# Patient Record
Sex: Female | Born: 1955 | Hispanic: Yes | Smoking: Never smoker
Health system: Southern US, Community
[De-identification: ages and names within clinical notes are randomized; demographics above are authoritative.]

## PROBLEM LIST (undated history)

## (undated) DIAGNOSIS — E113299 Type 2 diabetes mellitus with mild nonproliferative diabetic retinopathy without macular edema, unspecified eye: Secondary | ICD-10-CM

## (undated) DIAGNOSIS — N189 Chronic kidney disease, unspecified: Secondary | ICD-10-CM

## (undated) DIAGNOSIS — T82898A Other specified complication of vascular prosthetic devices, implants and grafts, initial encounter: Secondary | ICD-10-CM

## (undated) DIAGNOSIS — E559 Vitamin D deficiency, unspecified: Secondary | ICD-10-CM

## (undated) DIAGNOSIS — R42 Dizziness and giddiness: Secondary | ICD-10-CM

## (undated) DIAGNOSIS — N3001 Acute cystitis with hematuria: Secondary | ICD-10-CM

## (undated) DIAGNOSIS — E1139 Type 2 diabetes mellitus with other diabetic ophthalmic complication: Secondary | ICD-10-CM

## (undated) DIAGNOSIS — D649 Anemia, unspecified: Secondary | ICD-10-CM

## (undated) DIAGNOSIS — Z9119 Patient's noncompliance with other medical treatment and regimen: Secondary | ICD-10-CM

## (undated) DIAGNOSIS — G629 Polyneuropathy, unspecified: Secondary | ICD-10-CM

## (undated) DIAGNOSIS — Z91199 Patient's noncompliance with other medical treatment and regimen due to unspecified reason: Secondary | ICD-10-CM

## (undated) DIAGNOSIS — G43809 Other migraine, not intractable, without status migrainosus: Secondary | ICD-10-CM

## (undated) DIAGNOSIS — IMO0002 Reserved for concepts with insufficient information to code with codable children: Secondary | ICD-10-CM

## (undated) DIAGNOSIS — E785 Hyperlipidemia, unspecified: Secondary | ICD-10-CM

## (undated) DIAGNOSIS — Z992 Dependence on renal dialysis: Secondary | ICD-10-CM

## (undated) DIAGNOSIS — I639 Cerebral infarction, unspecified: Secondary | ICD-10-CM

## (undated) DIAGNOSIS — M810 Age-related osteoporosis without current pathological fracture: Secondary | ICD-10-CM

## (undated) DIAGNOSIS — I10 Essential (primary) hypertension: Secondary | ICD-10-CM

## (undated) DIAGNOSIS — E1165 Type 2 diabetes mellitus with hyperglycemia: Secondary | ICD-10-CM

## (undated) DIAGNOSIS — H269 Unspecified cataract: Secondary | ICD-10-CM

## (undated) HISTORY — DX: Patient's noncompliance with other medical treatment and regimen: Z91.19

## (undated) HISTORY — DX: Other migraine, not intractable, without status migrainosus: G43.809

## (undated) HISTORY — DX: Age-related osteoporosis without current pathological fracture: M81.0

## (undated) HISTORY — DX: Polyneuropathy, unspecified: G62.9

## (undated) HISTORY — DX: Type 2 diabetes mellitus with mild nonproliferative diabetic retinopathy without macular edema, unspecified eye: E11.3299

## (undated) HISTORY — DX: Acute cystitis with hematuria: N30.01

## (undated) HISTORY — DX: Type 2 diabetes mellitus with other diabetic ophthalmic complication: E11.39

## (undated) HISTORY — DX: Patient's noncompliance with other medical treatment and regimen due to unspecified reason: Z91.199

## (undated) HISTORY — DX: Reserved for concepts with insufficient information to code with codable children: IMO0002

## (undated) HISTORY — DX: Vitamin D deficiency, unspecified: E55.9

## (undated) HISTORY — DX: Unspecified cataract: H26.9

## (undated) HISTORY — DX: Hyperlipidemia, unspecified: E78.5

## (undated) HISTORY — DX: Type 2 diabetes mellitus with hyperglycemia: E11.65

## (undated) HISTORY — DX: Dizziness and giddiness: R42

## (undated) HISTORY — PX: DILATION AND CURETTAGE OF UTERUS: SHX78

## (undated) HISTORY — DX: Essential (primary) hypertension: I10

---

## 2015-05-26 ENCOUNTER — Ambulatory Visit: Payer: Self-pay

## 2015-05-26 DIAGNOSIS — N3001 Acute cystitis with hematuria: Secondary | ICD-10-CM

## 2015-05-26 DIAGNOSIS — G629 Polyneuropathy, unspecified: Secondary | ICD-10-CM | POA: Insufficient documentation

## 2015-05-26 DIAGNOSIS — N3000 Acute cystitis without hematuria: Secondary | ICD-10-CM | POA: Insufficient documentation

## 2015-09-20 ENCOUNTER — Emergency Department
Admission: EM | Admit: 2015-09-20 | Discharge: 2015-09-20 | Disposition: A | Discharge status: Home / Self Care | Payer: Self-pay | Attending: Emergency Medicine | Admitting: Emergency Medicine

## 2015-09-20 ENCOUNTER — Ambulatory Visit: Payer: Self-pay | Attending: Oncology

## 2015-09-20 ENCOUNTER — Ambulatory Visit
Admission: RE | Admit: 2015-09-20 | Discharge: 2015-09-20 | Disposition: A | Discharge status: Home / Self Care | Payer: Self-pay | Source: Ambulatory Visit | Attending: Oncology | Admitting: Oncology

## 2015-09-20 ENCOUNTER — Other Ambulatory Visit: Payer: Self-pay

## 2015-09-20 VITALS — BP 210/91 | HR 90 | Temp 96.1°F | Ht <= 58 in | Wt 107.6 lb

## 2015-09-20 DIAGNOSIS — M542 Cervicalgia: Secondary | ICD-10-CM | POA: Insufficient documentation

## 2015-09-20 DIAGNOSIS — E113299 Type 2 diabetes mellitus with mild nonproliferative diabetic retinopathy without macular edema, unspecified eye: Secondary | ICD-10-CM | POA: Insufficient documentation

## 2015-09-20 DIAGNOSIS — M25512 Pain in left shoulder: Secondary | ICD-10-CM | POA: Insufficient documentation

## 2015-09-20 DIAGNOSIS — R2232 Localized swelling, mass and lump, left upper limb: Secondary | ICD-10-CM | POA: Insufficient documentation

## 2015-09-20 DIAGNOSIS — N289 Disorder of kidney and ureter, unspecified: Secondary | ICD-10-CM | POA: Insufficient documentation

## 2015-09-20 DIAGNOSIS — I10 Essential (primary) hypertension: Secondary | ICD-10-CM | POA: Insufficient documentation

## 2015-09-20 DIAGNOSIS — Z79899 Other long term (current) drug therapy: Secondary | ICD-10-CM | POA: Insufficient documentation

## 2015-09-20 DIAGNOSIS — Z794 Long term (current) use of insulin: Secondary | ICD-10-CM | POA: Insufficient documentation

## 2015-09-20 DIAGNOSIS — Z0000 Encounter for general adult medical examination without abnormal findings: Secondary | ICD-10-CM

## 2015-09-20 DIAGNOSIS — E1165 Type 2 diabetes mellitus with hyperglycemia: Secondary | ICD-10-CM | POA: Insufficient documentation

## 2015-09-20 DIAGNOSIS — E1139 Type 2 diabetes mellitus with other diabetic ophthalmic complication: Secondary | ICD-10-CM | POA: Insufficient documentation

## 2015-09-20 DIAGNOSIS — R221 Localized swelling, mass and lump, neck: Secondary | ICD-10-CM | POA: Insufficient documentation

## 2015-09-20 LAB — BASIC METABOLIC PANEL
Anion gap: 6 (ref 5–15)
BUN: 32 mg/dL — AB (ref 6–20)
CHLORIDE: 102 mmol/L (ref 101–111)
CO2: 29 mmol/L (ref 22–32)
CREATININE: 1.79 mg/dL — AB (ref 0.44–1.00)
Calcium: 8.8 mg/dL — ABNORMAL LOW (ref 8.9–10.3)
GFR calc Af Amer: 35 mL/min — ABNORMAL LOW (ref >60)
GFR calc non Af Amer: 30 mL/min — ABNORMAL LOW (ref >60)
Glucose, Bld: 244 mg/dL — ABNORMAL HIGH (ref 65–99)
Potassium: 4.9 mmol/L (ref 3.5–5.1)
SODIUM: 137 mmol/L (ref 135–145)

## 2015-09-20 LAB — CBC
HCT: 35 % (ref 35.0–47.0)
HEMOGLOBIN: 11.8 g/dL — AB (ref 12.0–16.0)
MCH: 28.5 pg (ref 26.0–34.0)
MCHC: 33.8 g/dL (ref 32.0–36.0)
MCV: 84.1 fL (ref 80.0–100.0)
Platelets: 236 10*3/uL (ref 150–440)
RBC: 4.16 MIL/uL (ref 3.80–5.20)
RDW: 13.6 % (ref 11.5–14.5)
WBC: 9.6 10*3/uL (ref 3.6–11.0)

## 2015-09-20 LAB — TROPONIN I

## 2015-09-20 MED ORDER — CLONIDINE HCL 0.1 MG PO TABS
0.1000 mg | ORAL_TABLET | Freq: Once | ORAL | Status: AC
  Administered 2015-09-20: 0.1 mg via ORAL
  Filled 2015-09-20: qty 1

## 2015-09-20 NOTE — ED Notes (Signed)
Pt went for mammogram this morning but did not complete due to her blood pressure being high. Staff recommended she be evaluated at ED. Pt reports she does take medication for high blood pressure and insulin for diabetes. Family and interpreter at bedside.

## 2015-09-20 NOTE — ED Provider Notes (Signed)
[facility] Emergency Department Provider Note  ____________________________________________  Time seen: 1642  I have reviewed the triage vital signs and the nursing notes.   HISTORY  Chief Complaint Hypertension     HPI Rawan Gilford Rile is a 59 y.o. female with a history of hypertension and diabetes presents with elevated blood pressure. She came to the emergency department after having gone for mammogram. Prior to the mammogram, blood pressure was taken with elevated levels. The mammogram was canceled and the patient was advised come the emergency department. The patient denies any discomfort. She is alert and communicative. The triage note mentions that she had a headache this morning prior to taking her blood pressure medicine, but the patient denies this to me at this time to the interpreter. She denies any chest pain or shortness of breath as well. She is not sure what type of blood pressure medicine she takes. She takes it twice a day and she did take it this morning.  After the interview/history noted, the patient and her daughter then report that she gets a strange swelling and discomfort in her left neck and shoulder that extends into her left arm. She denies any discomfort at this time.  Past Medical History  Diagnosis Date  . Acute cystitis with hematuria   . Neuropathy ([place])   . Migraine variant   . Cataracts, bilateral   . Diabetic retinopathy, background ([place])   . Dizziness   . Vitamin D deficiency   . Personal history of noncompliance with medical treatment, presenting hazards to health   . Type II diabetes mellitus with ophthalmic manifestations, uncontrolled ([place])   . Hyperlipidemia   . OP (osteoporosis)   . Hypertension     Patient Active Problem List   Diagnosis Date Noted  . Acute cystitis 05/26/2015  . Neuropathy ([place]) 05/26/2015    History reviewed. No pertinent past surgical history.  Current Outpatient Rx  Name   Route  Sig  Dispense  Refill  . atorvastatin (LIPITOR) 40 MG tablet   Oral   Take 40 mg by mouth daily.         Marland Kitchen gabapentin (NEURONTIN) 300 MG capsule   Oral   Take 300 mg by mouth 3 (three) times daily.         Marland Kitchen glucose blood test strip   Other   1 each by Other route as needed for other. Use as instructed         . insulin NPH-regular Human (NOVOLIN 70/30) (70-30) 100 UNIT/ML injection   Subcutaneous   Inject 6 Units into the skin.         . Insulin Syringe-Needle U-100 29G 0.5 ML MISC   Does not apply   by Does not apply route.         Marland Kitchen lisinopril (PRINIVIL,ZESTRIL) 40 MG tablet   Oral   Take 40 mg by mouth daily.           Allergies Review of patient's allergies indicates no known allergies.  Family History  Problem Relation Age of Onset  . Hypertension Mother   . Hyperlipidemia Mother     Social History Social History  Substance Use Topics  . Smoking status: Never Smoker   . Smokeless tobacco: None  . Alcohol Use: No    Review of Systems  Constitutional: Negative for fever. ENT: Negative for sore throat. Cardiovascular: Hypertension. Report of some discomfort in left shoulder radiating to left arm. See history of present illness Respiratory:  Negative for cough. Gastrointestinal: Negative for abdominal pain, vomiting and diarrhea. Genitourinary: Negative for dysuria. Musculoskeletal: No myalgias or injuries. Skin: Negative for rash. Neurological: Negative for paresthesia or weakness   10-point ROS otherwise negative.  ____________________________________________   PHYSICAL EXAM:  VITAL SIGNS: ED Triage Vitals  Enc Vitals Group     BP 09/20/15 1512 204/70 mmHg     Pulse Rate 09/20/15 1512 68     Resp 09/20/15 1512 18     Temp 09/20/15 1512 98 F (36.7 C)     Temp Source 09/20/15 1512 Oral     SpO2 09/20/15 1512 99 %     Weight 09/20/15 1512 108 lb (48.988 kg)     Height 09/20/15 1512 4\' 8"  (1.422 m)     Head Cir --       Peak Flow --      Pain Score --      Pain Loc --      Pain Edu? --      Excl. in [place]? --     Constitutional: Alert. Well appearing and in no distress. ENT   Head: Normocephalic and atraumatic.   Nose: No congestion/rhinnorhea.    Neck: No noted swelling or edema. Normal range of motion. Cardiovascular: Normal rate, regular rhythm, no murmur noted Respiratory:  Normal respiratory effort, no tachypnea.    Breath sounds are clear and equal bilaterally.  Gastrointestinal: Soft and nontender. No distention.  Back: No muscle spasm, no tenderness, no CVA tenderness. Musculoskeletal: No deformity noted. Nontender with normal range of motion in all extremities.  No noted edema. Neurologic:  Normal speech and language. No gross focal neurologic deficits are appreciated.  Skin:  Skin is warm, dry. No rash noted.  ____________________________________________    LABS (pertinent positives/negatives)  Labs Reviewed  CBC - Abnormal; Notable for the following:    Hemoglobin 11.8 (*)    All other components within normal limits  BASIC METABOLIC PANEL - Abnormal; Notable for the following:    Glucose, Bld 244 (*)    BUN 32 (*)    Creatinine, Ser 1.79 (*)    Calcium 8.8 (*)    GFR calc non Af Amer 30 (*)    GFR calc Af Amer 35 (*)    All other components within normal limits  TROPONIN I     ____________________________________________   EKG  ED ECG REPORT I, Briseyda Fehr W, the attending physician, personally viewed and interpreted this ECG.   Date: 09/20/2015  EKG Time: 1727  Rate: 68  Rhythm: Normal sinus rhythm  Axis: Normal  Intervals: Normal  ST&T Change: None noted   ____________________________________________    RADIOLOGY    ____________________________________________    PROCEDURES    ____________________________________________   INITIAL IMPRESSION / ASSESSMENT AND PLAN / ED COURSE  Pertinent labs & imaging results that were available during  my care of the patient were reviewed by me and considered in my medical decision making (see chart for details).  Pleasant 59 year old Hispanic female with hypertension. She has a history of hypertension. While the triage note reports that the patient complained of a headache earlier today, the patient denies this. She reports no symptoms today no discomfort. She is having asymptomatic hypertension.  The patient and her daughter do confirm that she has a history of renal problems. Her renal values today are BUN 32 creatinine 1.79.  Given the added comment at the end of our interview of discomfort in her left shoulder into her left arm, we  have added on an EKG which is not showing any ischemic changes, and I have added on a troponin which is pending at this time.  ----------------------------------------- 6:54 PM on 09/20/2015 -----------------------------------------  Troponin is negative for this patient. Her blood pressure has improved some with the clonidine. I've advised her to take her regular evening dose of blood pressure medication when she returns home. I have asked her and her family member to follow-up with [facility], her regular provider, for reevaluation and ongoing care.  ____________________________________________   FINAL CLINICAL IMPRESSION(S) / ED DIAGNOSES  Final diagnoses:  Essential hypertension  Renal insufficiency      Ahmed Prima, MD 09/20/15 1900

## 2015-09-20 NOTE — Progress Notes (Signed)
Patient ID: Anita Contreras, female   DOB: Apr 07, 1956, 59 y.o.   MRN: [id number] Recheck of patient blood pressure a 2:30 P.M. 197/71 in left arm, and 217/79 in right arm.  Pulse 65. Reports she takes blood pressure medication twice daily in morning and evening, and she has taken morning medication.  Patient also reports experiencing frequent chest burning and left shoulder swelling, although she is not experiencing at this time.  Phoned charge nurse in ED to notify of  patient coming for evaluation.  Jaqui Laukaitis interpreted exam. Will call patient to reschedule [facility] appointment.

## 2015-09-20 NOTE — Discharge Instructions (Signed)
Hipertensión  (Hypertension)  El término hipertensión es otra forma de denominar a la presión arterial elevada. La presión arterial elevada fuerza al corazón a trabajar más para bombear la sangre. Una lectura de la presión arterial consta de dos números: uno más alto sobre uno más bajo (por ejemplo, 110/72).  CUIDADOS EN EL HOGAR   · Haga que el médico le tome nuevamente la presión arterial.  · Tome los medicamentos solamente como se lo haya indicado el médico. Siga cuidadosamente las indicaciones. Los medicamentos pierden eficacia si omite dosis. El hecho de omitir las dosis también aumenta el riesgo de otros problemas.  · No fume.  · Contrólese la presión arterial en su casa como se lo haya indicado el médico.  SOLICITE AYUDA SI:  · Piensa que tiene una reacción a los medicamentos que está tomando.  · Tiene mareos o dolores de cabeza reiterados.  · Se le inflaman (hinchan) los tobillos.  · Tiene problemas de visión.  SOLICITE AYUDA DE INMEDIATO SI:   · Tiene un dolor de cabeza muy intenso y está confundido.  · Se siente débil, aturdido o se desmaya.  · Tiene dolor en el pecho o el estómago (abdominal).  · Tiene vómitos.  · No puede respirar muy bien.  ASEGÚRESE DE QUE:   · Comprende estas instrucciones.  · Controlará su afección.  · Recibirá ayuda de inmediato si no mejora o si empeora.     Esta información no tiene como fin reemplazar el consejo del médico. Asegúrese de hacerle al médico cualquier pregunta que tenga.     Document Released: 05/15/2010 Document Revised: 11/30/2013  Elsevier Interactive Patient Education ©2016 Elsevier Inc.

## 2015-09-20 NOTE — ED Notes (Addendum)
Patient went for check up at the [facility] and was found to be hypertensive and sent to ER. Patient reports history of hypertension and taking home meds, but unsure of medication that she takes. Patient reports having headache this morning when she woke up this morning and she took BP medications and headache went away.

## 2015-09-20 NOTE — ED Notes (Signed)
Was getting mammogram and they took bp and it was up.

## 2015-09-21 ENCOUNTER — Telehealth: Payer: Self-pay | Admitting: *Deleted

## 2016-04-15 ENCOUNTER — Encounter: Payer: Self-pay | Admitting: Emergency Medicine

## 2016-04-15 ENCOUNTER — Emergency Department: Payer: Self-pay

## 2016-04-15 ENCOUNTER — Inpatient Hospital Stay
Admission: EM | Admit: 2016-04-15 | Discharge: 2016-04-19 | DRG: 304 | Disposition: A | Discharge status: Home / Self Care | Payer: Self-pay | Attending: Internal Medicine | Admitting: Internal Medicine

## 2016-04-15 DIAGNOSIS — R109 Unspecified abdominal pain: Secondary | ICD-10-CM | POA: Insufficient documentation

## 2016-04-15 DIAGNOSIS — I619 Nontraumatic intracerebral hemorrhage, unspecified: Secondary | ICD-10-CM

## 2016-04-15 DIAGNOSIS — Z79899 Other long term (current) drug therapy: Secondary | ICD-10-CM

## 2016-04-15 DIAGNOSIS — E875 Hyperkalemia: Secondary | ICD-10-CM | POA: Diagnosis present

## 2016-04-15 DIAGNOSIS — R319 Hematuria, unspecified: Secondary | ICD-10-CM | POA: Diagnosis present

## 2016-04-15 DIAGNOSIS — N939 Abnormal uterine and vaginal bleeding, unspecified: Secondary | ICD-10-CM

## 2016-04-15 DIAGNOSIS — E1122 Type 2 diabetes mellitus with diabetic chronic kidney disease: Secondary | ICD-10-CM | POA: Diagnosis present

## 2016-04-15 DIAGNOSIS — Z91120 Patient's intentional underdosing of medication regimen due to financial hardship: Secondary | ICD-10-CM

## 2016-04-15 DIAGNOSIS — N184 Chronic kidney disease, stage 4 (severe): Secondary | ICD-10-CM | POA: Diagnosis present

## 2016-04-15 DIAGNOSIS — I639 Cerebral infarction, unspecified: Secondary | ICD-10-CM | POA: Diagnosis present

## 2016-04-15 DIAGNOSIS — N2581 Secondary hyperparathyroidism of renal origin: Secondary | ICD-10-CM | POA: Diagnosis present

## 2016-04-15 DIAGNOSIS — E872 Acidosis: Secondary | ICD-10-CM | POA: Diagnosis present

## 2016-04-15 DIAGNOSIS — Z794 Long term (current) use of insulin: Secondary | ICD-10-CM

## 2016-04-15 DIAGNOSIS — R42 Dizziness and giddiness: Secondary | ICD-10-CM

## 2016-04-15 DIAGNOSIS — E1140 Type 2 diabetes mellitus with diabetic neuropathy, unspecified: Secondary | ICD-10-CM | POA: Diagnosis present

## 2016-04-15 DIAGNOSIS — I129 Hypertensive chronic kidney disease with stage 1 through stage 4 chronic kidney disease, or unspecified chronic kidney disease: Secondary | ICD-10-CM | POA: Diagnosis present

## 2016-04-15 DIAGNOSIS — M810 Age-related osteoporosis without current pathological fracture: Secondary | ICD-10-CM | POA: Diagnosis present

## 2016-04-15 DIAGNOSIS — G936 Cerebral edema: Secondary | ICD-10-CM | POA: Diagnosis present

## 2016-04-15 DIAGNOSIS — N179 Acute kidney failure, unspecified: Secondary | ICD-10-CM | POA: Diagnosis present

## 2016-04-15 DIAGNOSIS — N950 Postmenopausal bleeding: Secondary | ICD-10-CM | POA: Diagnosis present

## 2016-04-15 DIAGNOSIS — E1165 Type 2 diabetes mellitus with hyperglycemia: Secondary | ICD-10-CM | POA: Diagnosis present

## 2016-04-15 DIAGNOSIS — I614 Nontraumatic intracerebral hemorrhage in cerebellum: Secondary | ICD-10-CM | POA: Insufficient documentation

## 2016-04-15 DIAGNOSIS — I160 Hypertensive urgency: Principal | ICD-10-CM | POA: Diagnosis present

## 2016-04-15 DIAGNOSIS — E1139 Type 2 diabetes mellitus with other diabetic ophthalmic complication: Secondary | ICD-10-CM | POA: Diagnosis present

## 2016-04-15 DIAGNOSIS — R34 Anuria and oliguria: Secondary | ICD-10-CM | POA: Diagnosis present

## 2016-04-15 DIAGNOSIS — H269 Unspecified cataract: Secondary | ICD-10-CM | POA: Diagnosis present

## 2016-04-15 DIAGNOSIS — Z8249 Family history of ischemic heart disease and other diseases of the circulatory system: Secondary | ICD-10-CM

## 2016-04-15 DIAGNOSIS — E11319 Type 2 diabetes mellitus with unspecified diabetic retinopathy without macular edema: Secondary | ICD-10-CM | POA: Diagnosis present

## 2016-04-15 DIAGNOSIS — Z9114 Patient's other noncompliance with medication regimen: Secondary | ICD-10-CM

## 2016-04-15 HISTORY — DX: Chronic kidney disease, unspecified: N18.9

## 2016-04-15 LAB — CBC
HCT: 30.8 % — ABNORMAL LOW (ref 35.0–47.0)
Hemoglobin: 10.3 g/dL — ABNORMAL LOW (ref 12.0–16.0)
MCH: 27.8 pg (ref 26.0–34.0)
MCHC: 33.5 g/dL (ref 32.0–36.0)
MCV: 82.9 fL (ref 80.0–100.0)
PLATELETS: 214 10*3/uL (ref 150–440)
RBC: 3.72 MIL/uL — ABNORMAL LOW (ref 3.80–5.20)
RDW: 14.9 % — AB (ref 11.5–14.5)
WBC: 6.3 10*3/uL (ref 3.6–11.0)

## 2016-04-15 LAB — URINALYSIS COMPLETE WITH MICROSCOPIC (ARMC ONLY)
BILIRUBIN URINE: NEGATIVE
Bacteria, UA: NONE SEEN
HGB URINE DIPSTICK: NEGATIVE
KETONES UR: NEGATIVE mg/dL
Leukocytes, UA: NEGATIVE
NITRITE: NEGATIVE
RBC / HPF: NONE SEEN RBC/hpf (ref 0–5)
SPECIFIC GRAVITY, URINE: 1.005 (ref 1.005–1.030)
Squamous Epithelial / LPF: NONE SEEN
WBC, UA: NONE SEEN WBC/hpf (ref 0–5)
pH: 6 (ref 5.0–8.0)

## 2016-04-15 LAB — COMPREHENSIVE METABOLIC PANEL
ALBUMIN: 3.3 g/dL — AB (ref 3.5–5.0)
ALK PHOS: 131 U/L — AB (ref 38–126)
ALT: 13 U/L — AB (ref 14–54)
ANION GAP: 8 (ref 5–15)
AST: 19 U/L (ref 15–41)
BUN: 48 mg/dL — ABNORMAL HIGH (ref 6–20)
CALCIUM: 8.2 mg/dL — AB (ref 8.9–10.3)
CO2: 24 mmol/L (ref 22–32)
CREATININE: 3.65 mg/dL — AB (ref 0.44–1.00)
Chloride: 105 mmol/L (ref 101–111)
GFR calc non Af Amer: 13 mL/min — ABNORMAL LOW (ref >60)
GFR, EST AFRICAN AMERICAN: 15 mL/min — AB (ref >60)
GLUCOSE: 234 mg/dL — AB (ref 65–99)
Potassium: 4.3 mmol/L (ref 3.5–5.1)
SODIUM: 137 mmol/L (ref 135–145)
TOTAL PROTEIN: 6.4 g/dL — AB (ref 6.5–8.1)
Total Bilirubin: 0.8 mg/dL (ref 0.3–1.2)

## 2016-04-15 LAB — GLUCOSE, CAPILLARY: GLUCOSE-CAPILLARY: 200 mg/dL — AB (ref 65–99)

## 2016-04-15 LAB — LIPASE, BLOOD: Lipase: 31 U/L (ref 11–51)

## 2016-04-15 LAB — TROPONIN I: Troponin I: <0.03 ng/mL

## 2016-04-15 MED ORDER — CLONIDINE HCL 0.1 MG PO TABS
0.1000 mg | ORAL_TABLET | Freq: Once | ORAL | Status: AC
  Administered 2016-04-15: 0.1 mg via ORAL
  Filled 2016-04-15: qty 1

## 2016-04-15 MED ORDER — DOCUSATE SODIUM 100 MG PO CAPS
100.0000 mg | ORAL_CAPSULE | Freq: Two times a day (BID) | ORAL | Status: DC
  Administered 2016-04-15 – 2016-04-19 (×8): 100 mg via ORAL
  Filled 2016-04-15 (×8): qty 1

## 2016-04-15 MED ORDER — ATORVASTATIN CALCIUM 20 MG PO TABS
40.0000 mg | ORAL_TABLET | Freq: Every day | ORAL | Status: DC
  Administered 2016-04-15 – 2016-04-19 (×5): 40 mg via ORAL
  Filled 2016-04-15 (×5): qty 2

## 2016-04-15 MED ORDER — MORPHINE SULFATE (PF) 2 MG/ML IV SOLN
2.0000 mg | Freq: Once | INTRAVENOUS | Status: AC
  Administered 2016-04-15: 2 mg via INTRAVENOUS
  Filled 2016-04-15: qty 1

## 2016-04-15 MED ORDER — LABETALOL HCL 100 MG PO TABS
100.0000 mg | ORAL_TABLET | ORAL | Status: AC
  Administered 2016-04-15: 100 mg via ORAL
  Filled 2016-04-15: qty 1

## 2016-04-15 MED ORDER — ONDANSETRON HCL 4 MG/2ML IJ SOLN
4.0000 mg | Freq: Four times a day (QID) | INTRAMUSCULAR | Status: DC | PRN
  Administered 2016-04-17: 4 mg via INTRAVENOUS
  Filled 2016-04-15 (×2): qty 2

## 2016-04-15 MED ORDER — ACETAMINOPHEN 650 MG RE SUPP: 650.0000 mg | Freq: Four times a day (QID) | RECTAL | Status: DC | PRN

## 2016-04-15 MED ORDER — SODIUM CHLORIDE 0.9 % IV BOLUS (SEPSIS)
1000.0000 mL | Freq: Once | INTRAVENOUS | Status: AC
Start: 2016-04-15 — End: 2016-04-15
  Administered 2016-04-15: 1000 mL via INTRAVENOUS

## 2016-04-15 MED ORDER — LISINOPRIL 10 MG PO TABS: 40.0000 mg | ORAL_TABLET | Freq: Once | ORAL | Status: DC

## 2016-04-15 MED ORDER — HYDRALAZINE HCL 20 MG/ML IJ SOLN
10.0000 mg | Freq: Once | INTRAMUSCULAR | Status: AC
  Administered 2016-04-15: 10 mg via INTRAVENOUS
  Filled 2016-04-15: qty 1

## 2016-04-15 MED ORDER — ONDANSETRON HCL 4 MG/2ML IJ SOLN
4.0000 mg | Freq: Once | INTRAMUSCULAR | Status: AC
  Administered 2016-04-15: 4 mg via INTRAVENOUS
  Filled 2016-04-15: qty 2

## 2016-04-15 MED ORDER — AMLODIPINE BESYLATE 5 MG PO TABS
5.0000 mg | ORAL_TABLET | Freq: Every day | ORAL | Status: DC
  Administered 2016-04-15 – 2016-04-16 (×2): 5 mg via ORAL
  Filled 2016-04-15 (×2): qty 1

## 2016-04-15 MED ORDER — INSULIN ASPART 100 UNIT/ML ~~LOC~~ SOLN: 0.0000 [IU] | Freq: Every day | SUBCUTANEOUS | Status: DC

## 2016-04-15 MED ORDER — HYDRALAZINE HCL 20 MG/ML IJ SOLN
10.0000 mg | Freq: Four times a day (QID) | INTRAMUSCULAR | Status: DC | PRN
  Administered 2016-04-18: 10 mg via INTRAVENOUS
  Filled 2016-04-15: qty 1

## 2016-04-15 MED ORDER — POLYETHYLENE GLYCOL 3350 17 G PO PACK
17.0000 g | PACK | Freq: Every day | ORAL | Status: DC | PRN
  Administered 2016-04-18: 17 g via ORAL
  Filled 2016-04-15: qty 1

## 2016-04-15 MED ORDER — ONDANSETRON HCL 4 MG PO TABS
4.0000 mg | ORAL_TABLET | Freq: Four times a day (QID) | ORAL | Status: DC | PRN
Start: 2016-04-15 — End: 2016-04-19
  Administered 2016-04-16: 4 mg via ORAL

## 2016-04-15 MED ORDER — ACETAMINOPHEN 325 MG PO TABS
650.0000 mg | ORAL_TABLET | Freq: Four times a day (QID) | ORAL | Status: DC | PRN
  Administered 2016-04-17 – 2016-04-18 (×2): 650 mg via ORAL
  Filled 2016-04-15 (×2): qty 2

## 2016-04-15 MED ORDER — CLONIDINE HCL 0.1 MG PO TABS
0.1000 mg | ORAL_TABLET | Freq: Two times a day (BID) | ORAL | Status: DC
  Administered 2016-04-16 – 2016-04-17 (×3): 0.1 mg via ORAL
  Filled 2016-04-15 (×4): qty 1

## 2016-04-15 MED ORDER — HEPARIN SODIUM (PORCINE) 5000 UNIT/ML IJ SOLN
5000.0000 [IU] | Freq: Three times a day (TID) | INTRAMUSCULAR | Status: DC
  Administered 2016-04-15 – 2016-04-17 (×4): 5000 [IU] via SUBCUTANEOUS
  Filled 2016-04-15 (×4): qty 1

## 2016-04-15 MED ORDER — GABAPENTIN 300 MG PO CAPS
300.0000 mg | ORAL_CAPSULE | Freq: Three times a day (TID) | ORAL | Status: DC
  Administered 2016-04-15 – 2016-04-16 (×4): 300 mg via ORAL
  Filled 2016-04-15 (×5): qty 1

## 2016-04-15 MED ORDER — INSULIN ASPART 100 UNIT/ML ~~LOC~~ SOLN
0.0000 [IU] | Freq: Three times a day (TID) | SUBCUTANEOUS | Status: DC
  Administered 2016-04-16: 3 [IU] via SUBCUTANEOUS
  Administered 2016-04-16: 1 [IU] via SUBCUTANEOUS
  Administered 2016-04-16: 3 [IU] via SUBCUTANEOUS
  Administered 2016-04-17: 1 [IU] via SUBCUTANEOUS
  Administered 2016-04-17: 3 [IU] via SUBCUTANEOUS
  Administered 2016-04-17: 2 [IU] via SUBCUTANEOUS
  Administered 2016-04-18 (×2): 1 [IU] via SUBCUTANEOUS
  Filled 2016-04-15: qty 10
  Filled 2016-04-15: qty 1
  Filled 2016-04-15 (×2): qty 3
  Filled 2016-04-15: qty 2
  Filled 2016-04-15: qty 3
  Filled 2016-04-15 (×2): qty 1

## 2016-04-15 MED ORDER — SODIUM CHLORIDE 0.9 % IV SOLN
INTRAVENOUS | Status: AC
  Administered 2016-04-15: 21:00:00 via INTRAVENOUS

## 2016-04-15 NOTE — H&P (Signed)
[place] [facility] [place] NAME: Anita Contreras    MR#:  [id number]  DATE OF BIRTH:  08/29/1956  DATE OF ADMISSION:  04/15/2016  PRIMARY CARE PHYSICIAN: WHITE, Arlie Solomons, FNP   REQUESTING/REFERRING PHYSICIAN: Dr. Delman Kitten  CHIEF COMPLAINT:   Chief Complaint  Patient presents with  . Abdominal Pain    HISTORY OF PRESENT ILLNESS:  Anita Contreras  is a 60 y.o. female with a known history of uncontrolled HTN, DM, osteoporosis, diabetic neuropathy, retinopathy presents to the hospital secondary to worsening right lower quadrant abdominal pain and today history of vaginal bleeding. Patient is Spanish-speaking, so used an Astronomer. She says her light right lower quadrant abdominal pain started about 3 months ago. She has been using over-the-counter Tylenol to for pain relief and continuing all her daily activities. She says pain is constant with no radiation up until recently. Now the pain in the right lower quadrant is radiating along her anterior thigh all the way up to the foot and gives her a cramping sensation. 2 days ago she had dark maroon colored discharge from her vaginal it stopped spontaneously after 2 days. Denies any clots. Patient is postmenopausal. Never had prior vaginal bleeding. Denies any fevers, but has chills, nausea and vomiting. Also complains of loose stools for several days now. Blood pressure is elevated 230/128 in the emergency room. Prior ER visit and PCP visit indicated that she does have uncontrolled hypertension. Labs indicate worsening renal function. Last creatinine from October 2016 was 1.8, now it's up to 3.6. CT of the abdomen without contrast does not show any acute abnormalities.  PAST MEDICAL HISTORY:   Past Medical History  Diagnosis Date  . Acute cystitis with hematuria   . Neuropathy ([place])   . Migraine variant   . Cataracts, bilateral   . Diabetic retinopathy, background ([place])    . Dizziness   . Vitamin D deficiency   . Personal history of noncompliance with medical treatment, presenting hazards to health   . Type II diabetes mellitus with ophthalmic manifestations, uncontrolled ([place])   . Hyperlipidemia   . OP (osteoporosis)   . Hypertension   . CKD (chronic kidney disease)     PAST SURGICAL HISTORY:  History reviewed. No pertinent past surgical history. No surgical history  SOCIAL HISTORY:   Social History  Substance Use Topics  . Smoking status: Never Smoker   . Smokeless tobacco: Not on file  . Alcohol Use: No    FAMILY HISTORY:   Family History  Problem Relation Age of Onset  . Hypertension Mother   . Hyperlipidemia Mother     DRUG ALLERGIES:  No Known Allergies  REVIEW OF SYSTEMS:   Review of Systems  Constitutional: Positive for malaise/fatigue. Negative for fever, chills and weight loss.  HENT: Negative for ear discharge, ear pain, nosebleeds and tinnitus.   Eyes: Positive for blurred vision. Negative for double vision and photophobia.  Respiratory: Negative for cough, hemoptysis, shortness of breath and wheezing.   Cardiovascular: Negative for chest pain, palpitations, orthopnea and leg swelling.  Gastrointestinal: Positive for nausea, vomiting, abdominal pain and diarrhea. Negative for heartburn, constipation and melena.  Genitourinary: Negative for dysuria, urgency and frequency.       Vaginal bleeding x 2 days  Musculoskeletal: Positive for myalgias. Negative for back pain and neck pain.  Skin: Negative for rash.  Neurological: Positive for headaches. Negative for dizziness, tremors, sensory change, speech change and focal weakness.  Endo/Heme/Allergies: Does not bruise/bleed easily.  Psychiatric/Behavioral: Negative for depression.    MEDICATIONS AT HOME:   Prior to Admission medications   Medication Sig Start Date End Date Taking? Authorizing Provider  acetaminophen (TYLENOL) 500 MG tablet Take 1,000 mg by mouth every 6  (six) hours as needed for mild pain or headache.   Yes Historical Provider, MD  atorvastatin (LIPITOR) 40 MG tablet Take 40 mg by mouth at bedtime.    Yes Historical Provider, MD  insulin NPH-regular Human (NOVOLIN 70/30) (70-30) 100 UNIT/ML injection Inject 15 Units into the skin daily.    Yes Historical Provider, MD  lisinopril (PRINIVIL,ZESTRIL) 40 MG tablet Take 40 mg by mouth daily.   Yes Historical Provider, MD      VITAL SIGNS:  Blood pressure 220/85, pulse 77, temperature 97.6 F (36.4 C), temperature source Oral, resp. rate 18, height 4\' 5"  (1.346 m), weight 47.628 kg (105 lb), last menstrual period 09/19/2001, SpO2 99 %.  PHYSICAL EXAMINATION:   Physical Exam  GENERAL:  60 y.o.-year-old patient lying in the bed with no acute distress.  EYES: Pupils equal, round, reactive to light and accommodation. No scleral icterus. Extraocular muscles intact.  HEENT: Head atraumatic, normocephalic. Oropharynx and nasopharynx clear.  NECK:  Supple, no jugular venous distention. No thyroid enlargement, no tenderness.  LUNGS: Normal breath sounds bilaterally, no wheezing, rales,rhonchi or crepitation. No use of accessory muscles of respiration.  CARDIOVASCULAR: S1, S2 normal. No murmurs, rubs, or gallops.  ABDOMEN: Abdomen is soft, nondistended. Tender in right lower quadrant without any guarding or rigidity.. Bowel sounds present. No organomegaly or mass.  EXTREMITIES: No pedal edema, cyanosis, or clubbing.  NEUROLOGIC: Cranial nerves II through XII are intact. Muscle strength 5/5 in all extremities. Sensation intact. Gait not checked.  PSYCHIATRIC: The patient is alert and oriented x 3.  SKIN: No obvious rash, lesion, or ulcer.   LABORATORY PANEL:   CBC  Recent Labs Lab 04/15/16 1025  WBC 6.3  HGB 10.3*  HCT 30.8*  PLT 214   ------------------------------------------------------------------------------------------------------------------  Chemistries   Recent Labs Lab  04/15/16 1025  NA 137  K 4.3  CL 105  CO2 24  GLUCOSE 234*  BUN 48*  CREATININE 3.65*  CALCIUM 8.2*  AST 19  ALT 13*  ALKPHOS 131*  BILITOT 0.8   ------------------------------------------------------------------------------------------------------------------  Cardiac Enzymes No results for input(s): TROPONINI in the last 168 hours. ------------------------------------------------------------------------------------------------------------------  RADIOLOGY:  Ct Abdomen Pelvis Wo Contrast  04/15/2016  CLINICAL DATA:  Right lower quadrant pain for 3 months EXAM: CT ABDOMEN AND PELVIS WITHOUT CONTRAST TECHNIQUE: Multidetector CT imaging of the abdomen and pelvis was performed following the standard protocol without IV contrast. COMPARISON:  None. FINDINGS: Lower chest:  Small pleural effusions are identified bilaterally. Hepatobiliary: No suspicious liver abnormalities identified. The gallbladder appears normal. There is no biliary dilatation. Pancreas: No mass or inflammatory process identified on this un-enhanced exam. Spleen: Within normal limits in size. Adrenals/Urinary Tract: No evidence of urolithiasis or hydronephrosis. No definite mass visualized on this un-enhanced exam. Stomach/Bowel: The stomach is within normal limits. The small bowel loops have a normal course and caliber. No obstruction. Normal appearance of the colon. The appendix is visualized and appears normal. Vascular/Lymphatic: Calcified atherosclerotic disease involves the abdominal aorta. No aneurysm. No enlarged retroperitoneal or mesenteric adenopathy. No enlarged pelvic or inguinal lymph nodes. Reproductive: No mass or other significant abnormality. Other: There is no ascites or focal fluid collections within the abdomen or pelvis. There is a umbilical hernia which  contains fat only. Musculoskeletal: Degenerative disc disease is noted within the lower lumbar spine at the L5-S1 level. IMPRESSION: 1. No acute findings  identified within the abdomen or pelvis. 2. Aortic atherosclerosis 3. Small umbilical hernia contains fat only. 4. Small bilateral pleural effusions. Electronically Signed   By: Kerby Moors M.D.   On: 04/15/2016 14:32   US Aorta  04/15/2016  CLINICAL DATA:  Abdominal pain.  Evaluate for aortic dissection. EXAM: ULTRASOUND OF ABDOMINAL AORTA TECHNIQUE: Ultrasound examination of the abdominal aorta was performed to evaluate for abdominal aortic aneurysm. COMPARISON:  Noncontrast abdominal CT 04/15/2016 FINDINGS: Abdominal Aorta No abdominal aortic aneurysm. The distal abdominal aorta measures up to 1.3 cm with atherosclerotic disease. Mid abdominal aorta measures up to 1.4 cm. Maximum size of the proximal abdominal aorta is 1.8 cm. This modality is not optimal for evaluation for an aortic dissection. However, a dissection is thought to be unlikely based on the size of this vessel and there is no clear evidence for a false channel. Iliac arteries: Right common iliac artery measures up to 0.8 cm with atherosclerotic calcifications. The right common iliac artery is patent. Left common iliac artery measures up to the 0.7 cm and patent. IMPRESSION: Negative for an abdominal aortic aneurysm. The abdominal aorta is small with atherosclerotic disease. Limited evaluation for an aortic dissection based on the modality and size of the aorta. An aortic dissection is thought to be unlikely based on the color Doppler images and size of the aorta. However, noncontrast MR would be more definitive evaluation for an aortic dissection if the patient cannot receive intravenous contrast. Electronically Signed   By: Markus Daft M.D.   On: 04/15/2016 16:01    EKG:   Orders placed or performed during the hospital encounter of 04/15/16  . ED EKG  . ED EKG    IMPRESSION AND PLAN:   Dondi Clarida  is a 60 y.o. female with a known history of uncontrolled HTN, DM, osteoporosis, diabetic neuropathy, retinopathy presents  to the hospital secondary to worsening right lower quadrant abdominal pain and today history of vaginal bleeding.  #1 hypertensive urgency- history of noncompliance with medications. Chronically elevated blood pressure. -Due to renal failure, hold lisinopril. -Started on clonidine and also Norvasc. Also IV hydralazine when necessary  #2 acute renal failure on CKD-also progressively worsening CKD due to uncontrolled hypertension. -CT of the abdomen with no obstruction or hydronephrosis noted. -Gentle hydration. Avoid nephrotoxins. Nephrology has been consulted. Hold lisinopril.  #3 right lower quadrant abdominal pain-could be muscular pain. CT of the abdomen without contrast does not show any acute findings. Appendix seems normal. However due to significant tenderness, we'll consult surgery. Also associated with nausea and vomiting according to patient.  #4 diabetes mellitus-check A1c. Patient not sure of what she takes at home for her diabetes. On 70/30 insulin. -Started on sliding scale insulin here and verify home medications  #5 vaginal bleeding-postmenopausal vaginal bleeding. Spontaneously resolved. Hemoglobin is stable. Recheck tomorrow a.m. -Transvaginal ultrasound ordered. If no acute issues, can be followed up as an outpatient  #6 neuropathy-continue gabapentin  #7 DVT prophylaxis-started on subcutaneous heparin    All the records are reviewed and case discussed with ED provider. Management plans discussed with the patient, family and they are in agreement.  CODE STATUS: Full code  TOTAL TIME TAKING CARE OF THIS PATIENT: 50 minutes.    Gladstone Lighter M.D on 04/15/2016 at 5:33 PM  Between 7am to 6pm - Pager - [phone]  After 6pm  go to [email or web address] - password [email or web address] [place] [facility]  Office  [phone]  CC: Primary care physician; WHITE, Arlie Solomons, FNP

## 2016-04-15 NOTE — ED Provider Notes (Signed)
[facility] Emergency Department Provider Note  ____________________________________________  Time seen: Approximately 1:21 PM  I have reviewed the triage vital signs and the nursing notes.   HISTORY  Chief Complaint Abdominal Pain  History obtained via [place] interpreter  HPI Anita Contreras is a 60 y.o. female is been experiencing pain in the right side of her abdomen for about 3 months. Over the last 2-3 weeks the pain has been steadily worsening, and she has had nausea and not eating well.  Family notes the patient has some very slight swelling in both feet over the last few days as well.  X-ray a key, fairly constant" growing" feeling in the right lower abdomen. No nausea vomiting fever or chills.  No chest pain or shortness of breath. She occasionally has some trouble emptying the bladder, but denies any trouble urinating or burning with urination. She does associate that a couple days ago she had some slight dark vaginal discharge which has resolved.   Past Medical History  Diagnosis Date  . Acute cystitis with hematuria   . Neuropathy ([place])   . Migraine variant   . Cataracts, bilateral   . Diabetic retinopathy, background ([place])   . Dizziness   . Vitamin D deficiency   . Personal history of noncompliance with medical treatment, presenting hazards to health   . Type II diabetes mellitus with ophthalmic manifestations, uncontrolled ([place])   . Hyperlipidemia   . OP (osteoporosis)   . Hypertension   . CKD (chronic kidney disease)     Patient Active Problem List   Diagnosis Date Noted  . ARF (acute renal failure) ([place]) 04/15/2016  . Acute cystitis 05/26/2015  . Neuropathy ([place]) 05/26/2015    History reviewed. No pertinent past surgical history.  No current outpatient prescriptions on file.  Allergies Review of patient's allergies indicates no known allergies.  Family History  Problem Relation Age of Onset  . Hypertension  Mother   . Hyperlipidemia Mother     Social History Social History  Substance Use Topics  . Smoking status: Never Smoker   . Smokeless tobacco: None  . Alcohol Use: No    Review of Systems Constitutional: No fever/chills. Fatigue. Eyes: No visual changes. ENT: No sore throat. Cardiovascular: Denies chest pain. Respiratory: Denies shortness of breath. Gastrointestinal:  No constipation. Genitourinary: Negative for dysuria. Musculoskeletal: Negative for back pain. No pain noted in the middle of the abdomen. Skin: Negative for rash. Neurological: Negative for headaches, focal weakness or numbness.  10-point ROS otherwise negative.  ____________________________________________   PHYSICAL EXAM:  VITAL SIGNS: ED Triage Vitals  Enc Vitals Group     BP 04/15/16 1017 228/82 mmHg     Pulse Rate 04/15/16 1017 62     Resp 04/15/16 1017 18     Temp 04/15/16 1017 97.6 F (36.4 C)     Temp Source 04/15/16 1017 Oral     SpO2 04/15/16 1017 94 %     Weight 04/15/16 1017 105 lb (47.628 kg)     Height 04/15/16 1017 4\' 5"  (1.346 m)     Head Cir --      Peak Flow --      Pain Score 04/15/16 1021 3     Pain Loc --      Pain Edu? --      Excl. in [place]? --    Constitutional: Alert and oriented. Well appearing and in no acute distress. Eyes: Conjunctivae are nrmal. PERRL. EOMI. Head: Atraumatic. Nose: No congestion/rhinnorhea.  Mouth/Throat: Mucous membranes are moist.  Oropharynx non-erythematous. Neck: No stridor.   Cardiovascular: Normal rate, regular rhythm. Grossly normal heart sounds.  Good peripheral circulation. Respiratory: Normal respiratory effort.  No retractions. Lungs CTAB. Gastrointestinal: Soft and nontender except for some focal tenderness along the right lower abdomen and flank without obvious peritonitis. No distention. No abdominal bruits. No CVA tenderness. Musculoskeletal: No lower extremity tenderness nor edema except for some very small trace edema in the feet  bilateral.  Neurologic:  Normal speech and language. No gross focal neurologic deficits are appreciated. No gait instability. Skin:  Skin is warm, dry and intact. No rash noted. Psychiatric: Mood and affect are normal. Speech and behavior are normal.  ____________________________________________   LABS (all labs ordered are listed, but only abnormal results are displayed)  Labs Reviewed  COMPREHENSIVE METABOLIC PANEL - Abnormal; Notable for the following:    Glucose, Bld 234 (*)    BUN 48 (*)    Creatinine, Ser 3.65 (*)    Calcium 8.2 (*)    Total Protein 6.4 (*)    Albumin 3.3 (*)    ALT 13 (*)    Alkaline Phosphatase 131 (*)    GFR calc non Af Amer 13 (*)    GFR calc Af Amer 15 (*)    All other components within normal limits  CBC - Abnormal; Notable for the following:    RBC 3.72 (*)    Hemoglobin 10.3 (*)    HCT 30.8 (*)    RDW 14.9 (*)    All other components within normal limits  URINALYSIS COMPLETEWITH MICROSCOPIC ([facility] ONLY) - Abnormal; Notable for the following:    Color, Urine YELLOW (*)    APPearance CLEAR (*)    Glucose, UA 2+ (*)    Protein, ur 3+ (*)    All other components within normal limits  LIPASE, BLOOD  TROPONIN I  TROPONIN I  TROPONIN I  TROPONIN I  HEMOGLOBIN [id number]  BASIC METABOLIC PANEL  CBC  URINALYSIS COMPLETEWITH MICROSCOPIC ([facility] ONLY)   ____________________________________________  EKG   ____________________________________________  RADIOLOGY   US Aorta (Final result) Result time: 04/15/16 16:01:42   Final result by Rad Results In Interface (04/15/16 16:01:42)   Narrative:   CLINICAL DATA: Abdominal pain. Evaluate for aortic dissection.  EXAM: ULTRASOUND OF ABDOMINAL AORTA  TECHNIQUE: Ultrasound examination of the abdominal aorta was performed to evaluate for abdominal aortic aneurysm.  COMPARISON: Noncontrast abdominal CT 04/15/2016  FINDINGS: Abdominal Aorta  No abdominal aortic aneurysm. The distal abdominal  aorta measures up to 1.3 cm with atherosclerotic disease. Mid abdominal aorta measures up to 1.4 cm. Maximum size of the proximal abdominal aorta is 1.8 cm. This modality is not optimal for evaluation for an aortic dissection. However, a dissection is thought to be unlikely based on the size of this vessel and there is no clear evidence for a false channel.  Iliac arteries: Right common iliac artery measures up to 0.8 cm with atherosclerotic calcifications. The right common iliac artery is patent. Left common iliac artery measures up to the 0.7 cm and patent.  IMPRESSION: Negative for an abdominal aortic aneurysm.  The abdominal aorta is small with atherosclerotic disease. Limited evaluation for an aortic dissection based on the modality and size of the aorta. An aortic dissection is thought to be unlikely based on the color Doppler images and size of the aorta. However, noncontrast MR would be more definitive evaluation for an aortic dissection if the patient cannot receive intravenous contrast.  Electronically Signed By: Markus Daft M.D. On: 04/15/2016 16:01          CT Abdomen Pelvis Wo Contrast (Final result) Result time: 04/15/16 14:32:37   Final result by Rad Results In Interface (04/15/16 14:32:37)   Narrative:   CLINICAL DATA: Right lower quadrant pain for 3 months  EXAM: CT ABDOMEN AND PELVIS WITHOUT CONTRAST  TECHNIQUE: Multidetector CT imaging of the abdomen and pelvis was performed following the standard protocol without IV contrast.  COMPARISON: None.  FINDINGS: Lower chest: Small pleural effusions are identified bilaterally.  Hepatobiliary: No suspicious liver abnormalities identified. The gallbladder appears normal. There is no biliary dilatation.  Pancreas: No mass or inflammatory process identified on this un-enhanced exam.  Spleen: Within normal limits in size.  Adrenals/Urinary Tract: No evidence of urolithiasis  or hydronephrosis. No definite mass visualized on this un-enhanced exam.  Stomach/Bowel: The stomach is within normal limits. The small bowel loops have a normal course and caliber. No obstruction. Normal appearance of the colon. The appendix is visualized and appears normal.  Vascular/Lymphatic: Calcified atherosclerotic disease involves the abdominal aorta. No aneurysm. No enlarged retroperitoneal or mesenteric adenopathy. No enlarged pelvic or inguinal lymph nodes.  Reproductive: No mass or other significant abnormality.  Other: There is no ascites or focal fluid collections within the abdomen or pelvis. There is a umbilical hernia which contains fat only.  Musculoskeletal: Degenerative disc disease is noted within the lower lumbar spine at the L5-S1 level.  IMPRESSION: 1. No acute findings identified within the abdomen or pelvis. 2. Aortic atherosclerosis 3. Small umbilical hernia contains fat only. 4. Small bilateral pleural effusions.   Electronically Signed By: Kerby Moors M.D. On: 04/15/2016 14:32       ____________________________________________   PROCEDURES  Procedure(s) performed: None  Critical Care performed: No  ____________________________________________   INITIAL IMPRESSION / ASSESSMENT AND PLAN / ED COURSE  Pertinent labs & imaging results that were available during my care of the patient were reviewed by me and considered in my medical decision making (see chart for details).  No cardiac or pulmonary symptoms. No neurologic symptoms, though her blood pressure is notably elevated with presentation.   Filed Vitals:   04/15/16 1944 04/15/16 2013  BP: 133/55 155/59  Pulse: 71 73  Temp: 97.6 F (36.4 C) 97.6 F (36.4 C)  Resp: 12 17   Parents for evaluation of right flank discomfort worsening over the last 2-3 months. Evidently also for oral intake over the last 2 weeks. Exam was some mild tenderness and discomfort over the right  flank to right lower quadrant. CT scan performed and does not demonstrate acute pathology, given the patient's significant hypertension and the inability to use IV contrast, ultrasound performed to evaluate for dissection which does not demonstrate a clear obvious dissection.  Differential diagnosis includes but is not limited to, abdominal perforation, aortic dissection, cholecystitis, appendicitis, diverticulitis, colitis, esophagitis/gastritis, kidney stone, pyelonephritis, urinary tract infection, aortic aneurysm. All are considered in decision and treatment plan. Based upon the patient's presentation and risk factors, and new acute renal insufficiency, we'll hydrate the patient well as it sounds as a potentially prerenal in etiology without evidence of clear obstructive pathology on CT. Plan to admit the patient for ongoing workup and further evaluation. Did discuss with the hospitalist the patient also notes some vaginal discharge which is now resolved.  Patient given labetalol for high blood pressure, relative contraindication to continuing her lisinopril due to worsening renal function.  ----------------------------------------- 4:53 PM on 04/15/2016 -----------------------------------------  Patient reports pain in mild at this time, but would like additional medicine which is been written for. Overall appears improved. Blood pressure remains elevated, continued treatment with second dose of oral labetalol, heart rate in the 60s.  ____________________________________________   FINAL CLINICAL IMPRESSION(S) / ED DIAGNOSES  Final diagnoses:  Hypertensive urgency  Acute right flank pain  Acute kidney injury (HCC)      Delman Kitten, MD 04/15/16 2153

## 2016-04-15 NOTE — ED Notes (Signed)
Pt c/o pain in lower right quadrant x 3 months that has worsened in last week. Pt  Reports bleeding from vagina last week that was dark red and lasted 2 days (pt post menopausal x 20 years). Pt reports pain that makes it difficult to walk. Some diarrhea yesterday.

## 2016-04-16 ENCOUNTER — Inpatient Hospital Stay: Payer: Self-pay

## 2016-04-16 DIAGNOSIS — R109 Unspecified abdominal pain: Secondary | ICD-10-CM | POA: Insufficient documentation

## 2016-04-16 DIAGNOSIS — R1011 Right upper quadrant pain: Secondary | ICD-10-CM

## 2016-04-16 LAB — BASIC METABOLIC PANEL
Anion gap: 6 (ref 5–15)
BUN: 44 mg/dL — ABNORMAL HIGH (ref 6–20)
CHLORIDE: 113 mmol/L — AB (ref 101–111)
CO2: 22 mmol/L (ref 22–32)
Calcium: 7.6 mg/dL — ABNORMAL LOW (ref 8.9–10.3)
Creatinine, Ser: 3.34 mg/dL — ABNORMAL HIGH (ref 0.44–1.00)
GFR calc non Af Amer: 14 mL/min — ABNORMAL LOW (ref >60)
GFR, EST AFRICAN AMERICAN: 16 mL/min — AB (ref >60)
Glucose, Bld: 142 mg/dL — ABNORMAL HIGH (ref 65–99)
POTASSIUM: 4.5 mmol/L (ref 3.5–5.1)
Sodium: 141 mmol/L (ref 135–145)

## 2016-04-16 LAB — GLUCOSE, CAPILLARY
GLUCOSE-CAPILLARY: 126 mg/dL — AB (ref 65–99)
GLUCOSE-CAPILLARY: 202 mg/dL — AB (ref 65–99)
Glucose-Capillary: 241 mg/dL — ABNORMAL HIGH (ref 65–99)
Glucose-Capillary: 155 mg/dL — ABNORMAL HIGH (ref 65–99)

## 2016-04-16 LAB — CBC
HEMATOCRIT: 24.6 % — AB (ref 35.0–47.0)
Hemoglobin: 8.1 g/dL — ABNORMAL LOW (ref 12.0–16.0)
MCH: 27.4 pg (ref 26.0–34.0)
MCHC: 33 g/dL (ref 32.0–36.0)
MCV: 83.1 fL (ref 80.0–100.0)
Platelets: 177 10*3/uL (ref 150–440)
RBC: 2.97 MIL/uL — AB (ref 3.80–5.20)
RDW: 15 % — ABNORMAL HIGH (ref 11.5–14.5)
WBC: 5.2 10*3/uL (ref 3.6–11.0)

## 2016-04-16 LAB — TROPONIN I
Troponin I: 0.03 ng/mL (ref <0.031)
Troponin I: <0.03 ng/mL (ref <0.031)

## 2016-04-16 LAB — HEMOGLOBIN A1C: Hgb A1c MFr Bld: 7.2 % — ABNORMAL HIGH (ref 4.0–6.0)

## 2016-04-16 MED ORDER — SODIUM CHLORIDE 0.9 % IV SOLN
INTRAVENOUS | Status: DC
  Administered 2016-04-16: 22:00:00 via INTRAVENOUS

## 2016-04-16 MED ORDER — MECLIZINE HCL 25 MG PO TABS
25.0000 mg | ORAL_TABLET | Freq: Three times a day (TID) | ORAL | Status: DC | PRN
  Administered 2016-04-18: 25 mg via ORAL
  Filled 2016-04-16: qty 1

## 2016-04-16 NOTE — Progress Notes (Signed)
Initial Nutrition Assessment     INTERVENTION:  Monitor intake and cater to pt preferences Recommend No sugar added mightyshake BID for added nutrition Discussed diet restrictions briefly with pt and family via interpreter.  Verbalized understanding and expect good compliance Will ask nursing to obtain measured wt.   NUTRITION DIAGNOSIS:   Inadequate oral intake related to acute illness as evidenced by per patient/family report.   GOAL:   Patient will meet greater than or equal to 90% of their needs   MONITOR:   PO intake, Labs  REASON FOR ASSESSMENT:   Malnutrition Screening Tool    ASSESSMENT:   60 y/o female admitted with right lower abdominal pain, vaginal bleeding, hypertensive urgency Past Medical History  Diagnosis Date  . Acute cystitis with hematuria   . Neuropathy ([place])   . Migraine variant   . Cataracts, bilateral   . Diabetic retinopathy, background ([place])   . Dizziness   . Vitamin D deficiency   . Personal history of noncompliance with medical treatment, presenting hazards to health   . Type II diabetes mellitus with ophthalmic manifestations, uncontrolled ([place])   . Hyperlipidemia   . OP (osteoporosis)   . Hypertension   . CKD (chronic kidney disease)     Spoke via interpreter.  Pt reports appetite has been decreased for the last 3 months, maybe eating 25% compared to 100% of meals secondary to dizziness and vomiting.  Reports eating 100% of lunch and breakfast today and tolerating well.  Medications reviewed colace, aspart, NS at 26ml/hr Labs reviewed BUN 44, creatinine 3.34, glucose 142  Nutrition-Focused physical exam completed. Findings are WDL for fat depletion, muscle depletion, and edema.    Diet Order:  Diet heart healthy/carb modified Room service appropriate?: Yes; Fluid consistency:: Thin  Skin:  Reviewed, no issues  Last BM:  5/7  Height:   Ht Readings from Last 1 Encounters:  04/15/16 4\' 5"  (1.346 m)    Weight: pt reports  wt loss of 10 pounds in the last 3 months (8% wt loss in the last 3 months) ? Accuracy of wt trends recently   Wt Readings from Last 1 Encounters:  04/15/16 105 lb (47.628 kg)   Wt Readings from Last 10 Encounters:  04/15/16 105 lb (47.628 kg)  09/20/15 108 lb (48.988 kg)  09/20/15 107 lb 9.4 oz (48.8 kg)     Ideal Body Weight:     BMI:  Body mass index is 26.29 kg/(m^2).  Estimated Nutritional Needs:   Kcal:  1175-1410 kcals/d.   Protein:  59-70 g/d  Fluid:  1.1-1.4 L/d  EDUCATION NEEDS:   No education needs identified at this time  Tanisha Lutes B. Zenia Resides, [place], [place] (pager) Weekend/On-Call pager [phone])

## 2016-04-16 NOTE — Consult Note (Signed)
[facility]  CONSULT NOTE    Date: 04/16/2016                  Patient Name:  Anita Contreras  MRN: [id number]  DOB: 1956-05-03  Age / Sex: 60 y.o., female         PCP: WHITE, Arlie Solomons, FNP                 Service Requesting Consult: Dr. Fritzi Mandes                 Reason for Consult: Acute Renal Failure            History of Present Illness: Ms. Koya Hunger is a 60 y.o. Hispanic female with insulin dependent diabetes mellitus type II, diabetic retinopathy, hypertension, hyperlipidemia, hemorrhagic cystitis, migraine headaches, osteoarthritis, osteoporosis who was admitted to [facility] on 04/15/2016 for Vaginal bleeding [N93.9] Hypertensive urgency [I16.0] Acute kidney injury ([place]) [N17.9] Acute right flank pain [R10.11]  Patient's history is taken with assistance of Spanish Interpreter and Daughter. Patient states that she has chronic kidney disease stage III with a GFR of 40. She follows with [facility]. She states that for the last few days she has had a poor appetite and diarreha. She also experienced vaginal bleeding. She was admitted with a creatinine of 3.65 and started on IV fluids. UOP is oliguric. Creatinine has improved to 3.34.   She does use ibuprofen on occasion.    Medications: Outpatient medications: Prescriptions prior to admission  Medication Sig Dispense Refill Last Dose  . acetaminophen (TYLENOL) 500 MG tablet Take 1,000 mg by mouth every 6 (six) hours as needed for mild pain or headache.   04/15/2016 at 0800  . atorvastatin (LIPITOR) 40 MG tablet Take 40 mg by mouth at bedtime.    04/14/2016 at Unknown time  . insulin NPH-regular Human (NOVOLIN 70/30) (70-30) 100 UNIT/ML injection Inject 15 Units into the skin daily.    04/15/2016 at Unknown time  . lisinopril (PRINIVIL,ZESTRIL) 40 MG tablet Take 40 mg by mouth daily.   04/15/2016 at Unknown time    Current medications: Current Facility-Administered Medications   Medication Dose Route Frequency Provider Last Rate Last Dose  . 0.9 %  sodium chloride infusion   Intravenous Continuous Gladstone Lighter, MD 75 mL/hr at 04/15/16 2039    . acetaminophen (TYLENOL) tablet 650 mg  650 mg Oral Q6H PRN Gladstone Lighter, MD       Or  . acetaminophen (TYLENOL) suppository 650 mg  650 mg Rectal Q6H PRN Gladstone Lighter, MD      . amLODipine (NORVASC) tablet 5 mg  5 mg Oral Daily Gladstone Lighter, MD   5 mg at 04/16/16 0951  . atorvastatin (LIPITOR) tablet 40 mg  40 mg Oral Daily Gladstone Lighter, MD   40 mg at 04/16/16 0951  . cloNIDine (CATAPRES) tablet 0.1 mg  0.1 mg Oral BID Gladstone Lighter, MD   0.1 mg at 04/16/16 0951  . docusate sodium (COLACE) capsule 100 mg  100 mg Oral BID Gladstone Lighter, MD   100 mg at 04/16/16 0952  . gabapentin (NEURONTIN) capsule 300 mg  300 mg Oral TID Gladstone Lighter, MD   300 mg at 04/16/16 0952  . heparin injection 5,000 Units  5,000 Units Subcutaneous Q8H Gladstone Lighter, MD   5,000 Units at 04/16/16 0518  . hydrALAZINE (APRESOLINE) injection 10 mg  10 mg Intravenous Q6H PRN Gladstone Lighter, MD      .  insulin aspart (novoLOG) injection 0-5 Units  0-5 Units Subcutaneous QHS Gladstone Lighter, MD   0 Units at 04/15/16 2241  . insulin aspart (novoLOG) injection 0-9 Units  0-9 Units Subcutaneous TID WC Gladstone Lighter, MD   3 Units at 04/16/16 1209  . ondansetron (ZOFRAN) tablet 4 mg  4 mg Oral Q6H PRN Gladstone Lighter, MD   4 mg at 04/16/16 3009   Or  . ondansetron (ZOFRAN) injection 4 mg  4 mg Intravenous Q6H PRN Gladstone Lighter, MD      . polyethylene glycol (MIRALAX / GLYCOLAX) packet 17 g  17 g Oral Daily PRN Gladstone Lighter, MD          Allergies: No Known Allergies    Past Medical History: Past Medical History  Diagnosis Date  . Acute cystitis with hematuria   . Neuropathy ([place])   . Migraine variant   . Cataracts, bilateral   . Diabetic retinopathy, background ([place])   . Dizziness   . Vitamin D  deficiency   . Personal history of noncompliance with medical treatment, presenting hazards to health   . Type II diabetes mellitus with ophthalmic manifestations, uncontrolled ([place])   . Hyperlipidemia   . OP (osteoporosis)   . Hypertension   . CKD (chronic kidney disease)      Past Surgical History: History reviewed. No pertinent past surgical history.   Family History: Family History  Problem Relation Age of Onset  . Hypertension Mother   . Hyperlipidemia Mother      Social History: Social History   Social History  . Marital Status: Single    Spouse Name: N/A  . Number of Children: N/A  . Years of Education: N/A   Occupational History  . Not on file.   Social History Main Topics  . Smoking status: Never Smoker   . Smokeless tobacco: Not on file  . Alcohol Use: No  . Drug Use: No  . Sexual Activity: Not on file   Other Topics Concern  . Not on file   Social History Narrative   Lives at home with family        Review of Systems: Review of Systems  Constitutional: Positive for chills, weight loss and malaise/fatigue. Negative for fever and diaphoresis.  HENT: Negative for congestion, ear discharge, ear pain, hearing loss, nosebleeds, sore throat and tinnitus.   Eyes: Negative.  Negative for blurred vision, double vision, photophobia, pain, discharge and redness.  Respiratory: Positive for cough. Negative for hemoptysis, sputum production, shortness of breath, wheezing and stridor.   Cardiovascular: Positive for orthopnea, leg swelling and PND. Negative for chest pain, palpitations and claudication.  Gastrointestinal: Positive for nausea, vomiting, abdominal pain and diarrhea. Negative for heartburn, constipation, blood in stool and melena.  Genitourinary: Positive for hematuria and flank pain. Negative for dysuria, urgency and frequency.  Musculoskeletal: Positive for joint pain and falls. Negative for myalgias, back pain and neck pain.  Skin: Negative.   Negative for itching and rash.  Neurological: Positive for dizziness, tingling, weakness and headaches. Negative for tremors, sensory change, speech change, focal weakness, seizures and loss of consciousness.  Endo/Heme/Allergies: Negative for environmental allergies and polydipsia. Does not bruise/bleed easily.  Psychiatric/Behavioral: Negative.  Negative for depression, suicidal ideas, hallucinations, memory loss and substance abuse. The patient is not nervous/anxious and does not have insomnia.     Vital Signs: Blood pressure 147/56, pulse 78, temperature 98.7 F (37.1 C), temperature source Oral, resp. rate 17, height 4' 5" (1.346 m), weight 47.628  kg (105 lb), last menstrual period 09/19/2001, SpO2 96 %.  Weight trends: Filed Weights   04/15/16 1017  Weight: 47.628 kg (105 lb)    Physical Exam: General: NAD, laying in bed  Head: Normocephalic, atraumatic. Dry oral mucosal membranes  Eyes: Anicteric, PERRL  Neck: Supple, trachea midline  Lungs:  Clear to auscultation  Heart: Regular rate and rhythm  Abdomen:  Soft, nontender  Extremities: no peripheral edema.  Neurologic: Nonfocal, moving all four extremities  Skin: No lesions        Lab results: Basic Metabolic Panel:  Recent Labs Lab 04/15/16 1025 04/16/16 0727  NA 137 141  K 4.3 4.5  CL 105 113*  CO2 24 22  GLUCOSE 234* 142*  BUN 48* 44*  CREATININE 3.65* 3.34*  CALCIUM 8.2* 7.6*    Liver Function Tests:  Recent Labs Lab 04/15/16 1025  AST 19  ALT 13*  ALKPHOS 131*  BILITOT 0.8  PROT 6.4*  ALBUMIN 3.3*    Recent Labs Lab 04/15/16 1025  LIPASE 31   No results for input(s): AMMONIA in the last 168 hours.  CBC:  Recent Labs Lab 04/15/16 1025 04/16/16 0727  WBC 6.3 5.2  HGB 10.3* 8.1*  HCT 30.8* 24.6*  MCV 82.9 83.1  PLT 214 177    Cardiac Enzymes:  Recent Labs Lab 04/15/16 1025 04/15/16 1928 04/16/16 0114 04/16/16 0727  TROPONINI <0.03 <0.03 0.03 <0.03    BNP: Invalid  input(s): POCBNP  CBG:  Recent Labs Lab 04/15/16 2206 04/16/16 0735 04/16/16 1144  GLUCAP 200* 126* 202*    Microbiology: No results found for this or any previous visit.  Coagulation Studies: No results for input(s): LABPROT, INR in the last 72 hours.  Urinalysis:  Recent Labs  04/15/16 1025  COLORURINE YELLOW*  LABSPEC 1.005  PHURINE 6.0  GLUCOSEU 2+*  HGBUR NEGATIVE  BILIRUBINUR NEGATIVE  KETONESUR NEGATIVE  PROTEINUR 3+*  NITRITE NEGATIVE  LEUKOCYTESUR NEGATIVE      Imaging: Ct Abdomen Pelvis Wo Contrast  04/15/2016  CLINICAL DATA:  Right lower quadrant pain for 3 months EXAM: CT ABDOMEN AND PELVIS WITHOUT CONTRAST TECHNIQUE: Multidetector CT imaging of the abdomen and pelvis was performed following the standard protocol without IV contrast. COMPARISON:  None. FINDINGS: Lower chest:  Small pleural effusions are identified bilaterally. Hepatobiliary: No suspicious liver abnormalities identified. The gallbladder appears normal. There is no biliary dilatation. Pancreas: No mass or inflammatory process identified on this un-enhanced exam. Spleen: Within normal limits in size. Adrenals/Urinary Tract: No evidence of urolithiasis or hydronephrosis. No definite mass visualized on this un-enhanced exam. Stomach/Bowel: The stomach is within normal limits. The small bowel loops have a normal course and caliber. No obstruction. Normal appearance of the colon. The appendix is visualized and appears normal. Vascular/Lymphatic: Calcified atherosclerotic disease involves the abdominal aorta. No aneurysm. No enlarged retroperitoneal or mesenteric adenopathy. No enlarged pelvic or inguinal lymph nodes. Reproductive: No mass or other significant abnormality. Other: There is no ascites or focal fluid collections within the abdomen or pelvis. There is a umbilical hernia which contains fat only. Musculoskeletal: Degenerative disc disease is noted within the lower lumbar spine at the L5-S1 level.  IMPRESSION: 1. No acute findings identified within the abdomen or pelvis. 2. Aortic atherosclerosis 3. Small umbilical hernia contains fat only. 4. Small bilateral pleural effusions. Electronically Signed   By: Kerby Moors M.D.   On: 04/15/2016 14:32   US Pelvis Complete  04/16/2016  CLINICAL DATA:  Two days of vaginal bleeding ;  the patient was unable to void for the endovaginal portion of the study. EXAM: TRANSABDOMINAL ULTRASOUND OF PELVIS TECHNIQUE: Transabdominal ultrasound examination of the pelvis was performed including evaluation of the uterus, ovaries, adnexal regions, and pelvic cul-de-sac. COMPARISON:  Abdominal and pelvic CT scan of Apr 15, 2016. FINDINGS: Uterus Measurements: 5.6 x 2.9 x 4.6 cm. No fibroids or other mass visualized. Endometrium Thickness: 2.0 mm.  No focal abnormality visualized. Right ovary Measurements: 2.3 x 1.1 x 1.8 cm. Normal appearance/no adnexal mass. Left ovary Measurements: 1.7 x 1.0 x 1.4 cm. Normal appearance/no adnexal mass. Other findings:  No abnormal free fluid. IMPRESSION: Normal appearance of the uterus, the endometrium, and both ovaries. No endometrial masses or other abnormalities are observed. Electronically Signed   By: David  Martinique M.D.   On: 04/16/2016 10:53   US Aorta  04/15/2016  CLINICAL DATA:  Abdominal pain.  Evaluate for aortic dissection. EXAM: ULTRASOUND OF ABDOMINAL AORTA TECHNIQUE: Ultrasound examination of the abdominal aorta was performed to evaluate for abdominal aortic aneurysm. COMPARISON:  Noncontrast abdominal CT 04/15/2016 FINDINGS: Abdominal Aorta No abdominal aortic aneurysm. The distal abdominal aorta measures up to 1.3 cm with atherosclerotic disease. Mid abdominal aorta measures up to 1.4 cm. Maximum size of the proximal abdominal aorta is 1.8 cm. This modality is not optimal for evaluation for an aortic dissection. However, a dissection is thought to be unlikely based on the size of this vessel and there is no clear evidence for a  false channel. Iliac arteries: Right common iliac artery measures up to 0.8 cm with atherosclerotic calcifications. The right common iliac artery is patent. Left common iliac artery measures up to the 0.7 cm and patent. IMPRESSION: Negative for an abdominal aortic aneurysm. The abdominal aorta is small with atherosclerotic disease. Limited evaluation for an aortic dissection based on the modality and size of the aorta. An aortic dissection is thought to be unlikely based on the color Doppler images and size of the aorta. However, noncontrast MR would be more definitive evaluation for an aortic dissection if the patient cannot receive intravenous contrast. Electronically Signed   By: Markus Daft M.D.   On: 04/15/2016 16:01      Assessment & Plan: Ms. Harjit Douds is a 60 y.o. Hispanic female with insulin dependent diabetes mellitus type II, diabetic retinopathy, hypertension, hyperlipidemia, hemorrhagic cystitis, migraine headaches, osteoarthritis, osteoporosis who was admitted to [facility] on 04/15/2016   1. Acute Renal Failure on chronic kidney disease stage III with proteinuria: baseline creatinine of 1.79, eGFR of 30 from 09/20/2015. No labs from [facility] available.  History is suggestive of acute renal failure from prerenal azotemia versus progression of diabetic nephropathy.  - Discussed dialysis and renal biopsy. Renally dose all medications. Avoid nehprotoxic agents. Patient instructed to avoid nonsteroidal anti-inflammatory agents.  - Continue IV fluids for now. Encourage PO intake - Check SPEP/UPEP, ANA, ANCA, anti-GBM, hepatitis panel and serum complements.   2. Hypertension: elevated on admission. Now under good control.  - Current regimen of amlodipine and clondine - Home regimen of lisinopril.   3. Diabetes Mellitus type II with chronic kidney disease: insulin dependent. Hemoglobin A1c 7.2% - continue glucose control.     LOS: 1 KOLLURU, SARATH 5/9/20173:09 PM

## 2016-04-16 NOTE — Consult Note (Signed)
Patient ID: Anita Contreras, female   DOB: 1955-12-12, 60 y.o.   MRN: [id number]  HPI Anita Contreras is a 60 y.o. female asked to see in consultation for abdominal pain. She reports that is being about a week of right groin pain and and right lower quadrant pain. An extensive dull pain intermittent and moderate in intensity. Currently patient is pain-free. No specific alleviating or aggravating factors related to the pain She reports she had some nausea and apparently some vomiting. She is passing gas and she is hungry. She does have a significant comorbidities including hypertension and chronic renal insufficiency not on hemodialysis yet. She did have some vaginal bleeding.  HPI  Past Medical History  Diagnosis Date  . Acute cystitis with hematuria   . Neuropathy ([place])   . Migraine variant   . Cataracts, bilateral   . Diabetic retinopathy, background ([place])   . Dizziness   . Vitamin D deficiency   . Personal history of noncompliance with medical treatment, presenting hazards to health   . Type II diabetes mellitus with ophthalmic manifestations, uncontrolled ([place])   . Hyperlipidemia   . OP (osteoporosis)   . Hypertension   . CKD (chronic kidney disease)     History reviewed. No pertinent past surgical history.  Family History  Problem Relation Age of Onset  . Hypertension Mother   . Hyperlipidemia Mother     Social History Social History  Substance Use Topics  . Smoking status: Never Smoker   . Smokeless tobacco: None  . Alcohol Use: No    No Known Allergies  Current Facility-Administered Medications  Medication Dose Route Frequency Provider Last Rate Last Dose  . 0.9 %  sodium chloride infusion   Intravenous Continuous Gladstone Lighter, MD 75 mL/hr at 04/15/16 2039    . acetaminophen (TYLENOL) tablet 650 mg  650 mg Oral Q6H PRN Gladstone Lighter, MD       Or  . acetaminophen (TYLENOL) suppository 650 mg  650 mg Rectal Q6H PRN Gladstone Lighter, MD       . amLODipine (NORVASC) tablet 5 mg  5 mg Oral Daily Gladstone Lighter, MD   5 mg at 04/16/16 0951  . atorvastatin (LIPITOR) tablet 40 mg  40 mg Oral Daily Gladstone Lighter, MD   40 mg at 04/16/16 0951  . cloNIDine (CATAPRES) tablet 0.1 mg  0.1 mg Oral BID Gladstone Lighter, MD   0.1 mg at 04/16/16 0951  . docusate sodium (COLACE) capsule 100 mg  100 mg Oral BID Gladstone Lighter, MD   100 mg at 04/16/16 0952  . gabapentin (NEURONTIN) capsule 300 mg  300 mg Oral TID Gladstone Lighter, MD   300 mg at 04/16/16 1717  . heparin injection 5,000 Units  5,000 Units Subcutaneous Q8H Gladstone Lighter, MD   5,000 Units at 04/16/16 0518  . hydrALAZINE (APRESOLINE) injection 10 mg  10 mg Intravenous Q6H PRN Gladstone Lighter, MD      . insulin aspart (novoLOG) injection 0-5 Units  0-5 Units Subcutaneous QHS Gladstone Lighter, MD   0 Units at 04/15/16 2241  . insulin aspart (novoLOG) injection 0-9 Units  0-9 Units Subcutaneous TID WC Gladstone Lighter, MD   3 Units at 04/16/16 1717  . meclizine (ANTIVERT) tablet 25 mg  25 mg Oral TID PRN Fritzi Mandes, MD      . ondansetron [facility]) tablet 4 mg  4 mg Oral Q6H PRN Gladstone Lighter, MD   4 mg at 04/16/16 0952   Or  . ondansetron (  ZOFRAN) injection 4 mg  4 mg Intravenous Q6H PRN Gladstone Lighter, MD      . polyethylene glycol (MIRALAX / GLYCOLAX) packet 17 g  17 g Oral Daily PRN Gladstone Lighter, MD         Review of Systems A 10 point review of systems was asked and was negative except for the information on the HPI  Physical Exam Blood pressure 147/56, pulse 78, temperature 98.7 F (37.1 C), temperature source Oral, resp. rate 17, height 4\' 5"  (1.346 m), weight 47.628 kg (105 lb), last menstrual period 09/19/2001, SpO2 96 %. CONSTITUTIONAL: NAD EYES: Pupils are equal, round, and reactive to light, Sclera are non-icteric. EARS, NOSE, MOUTH AND THROAT: The oropharynx is clear. The oral mucosa is pink and moist. Hearing is intact to voice. LYMPH NODES:   Lymph nodes in the neck are normal. RESPIRATORY:  Lungs are clear. There is normal respiratory effort, with equal breath sounds bilaterally, and without pathologic use of accessory muscles. CARDIOVASCULAR: Heart is regular without murmurs, gallops, or rubs. GI: The abdomen is soft, nontender, and nondistended. There are no palpable masses. There is no hepatosplenomegaly. There are normal bowel sounds in all quadrants. No evidence of hernias GU: Rectal deferred.   MUSCULOSKELETAL: Normal muscle strength and tone. No cyanosis or edema.   SKIN: Turgor is good and there are no pathologic skin lesions or ulcers. NEUROLOGIC: Motor and sensation is grossly normal. Cranial nerves are grossly intact. PSYCH:  Oriented to person, place and time. Affect is normal.  Data Reviewed I have personally reviewed the patient's imaging, laboratory findings and medical records.    Assessment/Plan Right groin and RLQ pain currently resolved. CT scan reviewed no acute intra-abdominal pathology. No need for surgical indication likely musculoskeletal. We will be available if needed,  Caroleen Hamman, MD [place] Surgeon 04/16/2016, 5:45 PM

## 2016-04-16 NOTE — Progress Notes (Signed)
Patient ID: Ave Schmier, female   DOB: 02/28/1956, 61 y.o.   MRN: [id number] [facility] NAME: Anita Contreras    MR#:  [id number]  DATE OF BIRTH:  July 22, 1956  SUBJECTIVE:  Via interpreter. Pt very tearful by remebering his brother who died few years ago. Today c/o dizziness/vertigo No more abdominal pain No vaginal bleeding  REVIEW OF SYSTEMS:   Review of Systems  Constitutional: Negative for fever, chills and weight loss.  HENT: Negative for ear discharge, ear pain and nosebleeds.   Eyes: Negative for blurred vision, pain and discharge.  Respiratory: Negative for sputum production, shortness of breath, wheezing and stridor.   Cardiovascular: Negative for chest pain, palpitations, orthopnea and PND.  Gastrointestinal: Negative for nausea, vomiting, abdominal pain and diarrhea.  Genitourinary: Negative for urgency and frequency.  Musculoskeletal: Negative for back pain and joint pain.  Neurological: Negative for sensory change, speech change, focal weakness and weakness.  Psychiatric/Behavioral: Negative for depression and hallucinations. The patient is not nervous/anxious.    Tolerating Diet:yes Tolerating PT: rec rehab  DRUG ALLERGIES:  No Known Allergies  VITALS:  Blood pressure 147/56, pulse 78, temperature 98.7 F (37.1 C), temperature source Oral, resp. rate 17, height 4\' 5"  (1.346 m), weight 47.628 kg (105 lb), last menstrual period 09/19/2001, SpO2 96 %.  PHYSICAL EXAMINATION:   Physical Exam  GENERAL:  60 y.o.-year-old patient lying in the bed with no acute distress.  EYES: Pupils equal, round, reactive to light and accommodation. No scleral icterus. Extraocular muscles intact. Nystagmus++ HEENT: Head atraumatic, normocephalic. Oropharynx and nasopharynx clear.  NECK:  Supple, no jugular venous distention. No thyroid enlargement, no tenderness.  LUNGS: Normal breath sounds bilaterally,  no wheezing, rales, rhonchi. No use of accessory muscles of respiration.  CARDIOVASCULAR: S1, S2 normal. No murmurs, rubs, or gallops.  ABDOMEN: Soft, nontender, nondistended. Bowel sounds present. No organomegaly or mass.  EXTREMITIES: No cyanosis, clubbing or edema b/l.    NEUROLOGIC: Cranial nerves II through XII are intact. No focal Motor or sensory deficits b/l.   PSYCHIATRIC:  patient is alert and oriented x 3.  SKIN: No obvious rash, lesion, or ulcer.   LABORATORY PANEL:  CBC  Recent Labs Lab 04/16/16 0727  WBC 5.2  HGB 8.1*  HCT 24.6*  PLT 177    Chemistries   Recent Labs Lab 04/15/16 1025 04/16/16 0727  NA 137 141  K 4.3 4.5  CL 105 113*  CO2 24 22  GLUCOSE 234* 142*  BUN 48* 44*  CREATININE 3.65* 3.34*  CALCIUM 8.2* 7.6*  AST 19  --   ALT 13*  --   ALKPHOS 131*  --   BILITOT 0.8  --    Cardiac Enzymes  Recent Labs Lab 04/16/16 0727  TROPONINI <0.03   RADIOLOGY:  Ct Abdomen Pelvis Wo Contrast  04/15/2016  CLINICAL DATA:  Right lower quadrant pain for 3 months EXAM: CT ABDOMEN AND PELVIS WITHOUT CONTRAST TECHNIQUE: Multidetector CT imaging of the abdomen and pelvis was performed following the standard protocol without IV contrast. COMPARISON:  None. FINDINGS: Lower chest:  Small pleural effusions are identified bilaterally. Hepatobiliary: No suspicious liver abnormalities identified. The gallbladder appears normal. There is no biliary dilatation. Pancreas: No mass or inflammatory process identified on this un-enhanced exam. Spleen: Within normal limits in size. Adrenals/Urinary Tract: No evidence of urolithiasis or hydronephrosis. No definite mass visualized on this un-enhanced exam. Stomach/Bowel: The stomach is within normal limits. The  small bowel loops have a normal course and caliber. No obstruction. Normal appearance of the colon. The appendix is visualized and appears normal. Vascular/Lymphatic: Calcified atherosclerotic disease involves the abdominal  aorta. No aneurysm. No enlarged retroperitoneal or mesenteric adenopathy. No enlarged pelvic or inguinal lymph nodes. Reproductive: No mass or other significant abnormality. Other: There is no ascites or focal fluid collections within the abdomen or pelvis. There is a umbilical hernia which contains fat only. Musculoskeletal: Degenerative disc disease is noted within the lower lumbar spine at the L5-S1 level. IMPRESSION: 1. No acute findings identified within the abdomen or pelvis. 2. Aortic atherosclerosis 3. Small umbilical hernia contains fat only. 4. Small bilateral pleural effusions. Electronically Signed   By: Kerby Moors M.D.   On: 04/15/2016 14:32   US Pelvis Complete  04/16/2016  CLINICAL DATA:  Two days of vaginal bleeding ; the patient was unable to void for the endovaginal portion of the study. EXAM: TRANSABDOMINAL ULTRASOUND OF PELVIS TECHNIQUE: Transabdominal ultrasound examination of the pelvis was performed including evaluation of the uterus, ovaries, adnexal regions, and pelvic cul-de-sac. COMPARISON:  Abdominal and pelvic CT scan of Apr 15, 2016. FINDINGS: Uterus Measurements: 5.6 x 2.9 x 4.6 cm. No fibroids or other mass visualized. Endometrium Thickness: 2.0 mm.  No focal abnormality visualized. Right ovary Measurements: 2.3 x 1.1 x 1.8 cm. Normal appearance/no adnexal mass. Left ovary Measurements: 1.7 x 1.0 x 1.4 cm. Normal appearance/no adnexal mass. Other findings:  No abnormal free fluid. IMPRESSION: Normal appearance of the uterus, the endometrium, and both ovaries. No endometrial masses or other abnormalities are observed. Electronically Signed   By: David  Martinique M.D.   On: 04/16/2016 10:53   US Aorta  04/15/2016  CLINICAL DATA:  Abdominal pain.  Evaluate for aortic dissection. EXAM: ULTRASOUND OF ABDOMINAL AORTA TECHNIQUE: Ultrasound examination of the abdominal aorta was performed to evaluate for abdominal aortic aneurysm. COMPARISON:  Noncontrast abdominal CT 04/15/2016 FINDINGS:  Abdominal Aorta No abdominal aortic aneurysm. The distal abdominal aorta measures up to 1.3 cm with atherosclerotic disease. Mid abdominal aorta measures up to 1.4 cm. Maximum size of the proximal abdominal aorta is 1.8 cm. This modality is not optimal for evaluation for an aortic dissection. However, a dissection is thought to be unlikely based on the size of this vessel and there is no clear evidence for a false channel. Iliac arteries: Right common iliac artery measures up to 0.8 cm with atherosclerotic calcifications. The right common iliac artery is patent. Left common iliac artery measures up to the 0.7 cm and patent. IMPRESSION: Negative for an abdominal aortic aneurysm. The abdominal aorta is small with atherosclerotic disease. Limited evaluation for an aortic dissection based on the modality and size of the aorta. An aortic dissection is thought to be unlikely based on the color Doppler images and size of the aorta. However, noncontrast MR would be more definitive evaluation for an aortic dissection if the patient cannot receive intravenous contrast. Electronically Signed   By: Markus Daft M.D.   On: 04/15/2016 16:01   ASSESSMENT AND PLAN:   Anita Contreras is a 60 y.o. female with a known history of uncontrolled HTN, DM, osteoporosis, diabetic neuropathy, retinopathy presents to the hospital secondary to worsening right lower quadrant abdominal pain and today history of vaginal bleeding.  #1 hypertensive urgency- history of noncompliance with medications. Chronically elevated blood pressure. -Due to renal failure, hold lisinopril. -Started on clonidine and also Norvasc. Also IV hydralazine when necessary  #2 acute renal failure  on CKD-also progressively worsening CKD due to uncontrolled hypertension. -CT of the abdomen with no obstruction or hydronephrosis noted. -Gentle hydration. Avoid nephrotoxins. Nephrology has been consulted. Hold lisinopril.  #3 right lower quadrant abdominal  pain-could be muscular pain. CT of the abdomen without contrast does not show any acute findings. Appendix seems normal. However due to significant tenderness, we'll consult surgery. Also associated with nausea and vomiting according to patient. -pt pain resolved  #4 diabetes mellitus-check A1c. Patient not sure of what she takes at home for her diabetes. On 70/30 insulin. -Started on sliding scale insulin here and verify home medications  #5 vaginal bleeding-postmenopausal vaginal bleeding. Spontaneously resolved. Hemoglobin is stable. -Transvaginal ultrasound ordered. -no acute issues, can be followed up as an outpatient  #6 neuropathy-continue gabapentin  #7 DVT prophylaxis-started on subcutaneous heparin  #8 vertigo Given risk factors of HTN and DM-2 will do MRI to r/o central cause for vertigo -prn meclizine   Case discussed with Care Management/Social Worker. Management plans discussed with the patient, family and they are in agreement.  CODE STATUS: full DVT Prophylaxis: lovenox TOTAL TIME TAKING CARE OF THIS PATIENT: 40 minutes.  >50% time spent on counselling and coordination of care  POSSIBLE D/C IN 2-3 DAYS, DEPENDING ON CLINICAL CONDITION.  Note: This dictation was prepared with Dragon dictation along with smaller phrase technology. Any transcriptional errors that result from this process are unintentional.  Ahnaf Caponi M.D on 04/16/2016 at 4:29 PM  Between 7am to 6pm - Pager - [phone]  After 6pm go to [email or web address] - password [email or web address] [facility]  Office  [phone]  CC: Primary care physician; WHITE, Arlie Solomons, FNP

## 2016-04-16 NOTE — Evaluation (Signed)
Physical Therapy Evaluation Patient Details Name: Anita Contreras MRN: [id number] DOB: 04-Aug-1956 Today's Date: 04/16/2016   History of Present Illness  Anita Contreras  is a 60 y.o. female with a known history of uncontrolled HTN, DM, osteoporosis, diabetic neuropathy, retinopathy presents to the hospital secondary to worsening right lower quadrant abdominal pain and today history of vaginal bleeding. Patient is Spanish-speaking, so used an Astronomer. She says her light right lower quadrant abdominal pain started about 3 months ago. She has been using over-the-counter Tylenol to for pain relief and continuing all her daily activities. She says pain is constant with no radiation up until recently. Now the pain in the right lower quadrant is radiating along her anterior thigh all the way up to the foot and gives her a cramping sensation. 2 days ago she had dark maroon colored discharge from her vaginal it stopped spontaneously after 2 days. Denies any clots. Patient is postmenopausal. Never had prior vaginal bleeding. Denies any fevers, but has chills, nausea and vomiting. Also complains of loose stools for several days now. Blood pressure is elevated 230/128 in the emergency room. Prior ER visit and PCP visit indicated that she does have uncontrolled hypertension. Labs indicate worsening renal function. Last creatinine from October 2016 was 1.8, now it's up to 3.6. CT of the abdomen without contrast does not show any acute abnormalities. Pt reports 2 falls in the last 12 months  Clinical Impression  At time of PT evaluation pt reporting full resolution of RLE pain/numbness as well as vaginal bleeding. Pt reports that She started with R sided pain after her brother died 3 years ago. At that time she also started having dizziness. She states that she saw a physician and told that she was "about to have a stroke." Pt is complaining of severe dizziness at this time which limits her ability to  transfer and ambulate. No focal weakness or numbness/tingling identified or reported. Pt is very unsteady with transfers and ambulation with frequent staggering and correction by therapist to prevent falls. With MD in room observed L horizontal mid range nystagmus with L gaze and not observed in central or R gaze. MD reports she is ordering MRI as well as medication for dizziness. Pending results of MRI would recommend follow-up with ENT if no infarct identified. At this time pt is very unsafe with mobility and will need SNF placement at DC as she will certainly fall. However if dizziness is better controlled she may be appropriate to upgrade DC status and request family support at home. Unfortunately young grandson is the only family member present at this time. Pt will benefit from skilled PT services to address deficits in strength, balance, and mobility in order to return to full function at home.     Follow Up Recommendations SNF;Other (comment) (Hope to upgrade DC recommendations if dizziness improves)    Equipment Recommendations  Rolling walker with 5" wheels    Recommendations for Other Services       Precautions / Restrictions Precautions Precautions: Fall Restrictions Weight Bearing Restrictions: No      Mobility  Bed Mobility Overal bed mobility: Independent             General bed mobility comments: Good speed/sequencing  Transfers Overall transfer level: Needs assistance Equipment used: Rolling walker (2 wheeled) Transfers: Sit to/from Stand Sit to Stand: Min assist         General transfer comment: Pt unstable/unsteady with sit to stand transfers. She requires assist to  steady herself as well as bilateral UE assist on walker. Good LE strength noted  Ambulation/Gait Ambulation/Gait assistance: Mod assist Ambulation Distance (Feet): 20 Feet Assistive device: Rolling walker (2 wheeled) Gait Pattern/deviations: Decreased step length - left;Step-through  pattern;Decreased step length - right Gait velocity: Decreased   General Gait Details: Pt staggers heavily with gait requiring min to modA+1 support as well as rolling walker to prevent fall. She reports feeling very dizzy during ambulation and feeling generally unwell. Pt with poor safety awareness and is tearful throughout session. Further ambulation deferred due to severe unsteadiness  Stairs            Wheelchair Mobility    Modified Rankin (Stroke Patients Only)       Balance Overall balance assessment: Needs assistance Sitting-balance support: No upper extremity supported Sitting balance-Leahy Scale: Fair     Standing balance support: Bilateral upper extremity supported Standing balance-Leahy Scale: Poor                               Pertinent Vitals/Pain Pain Assessment: No/denies pain    Home Living Family/patient expects to be discharged to:: Private residence Living Arrangements: Alone Available Help at Discharge: Family Type of Home: Mobile home Home Access: Stairs to enter Entrance Stairs-Rails: Can reach both Entrance Stairs-Number of Steps: 3 Home Layout: One level Home Equipment: None      Prior Function Level of Independence: Needs assistance   Gait / Transfers Assistance Needed: Pt reports limited community ambulation without assistive device.   ADL's / Homemaking Assistance Needed: Independent with ADLs, cooks but needs daughter to pick up her groceries        Hand Dominance        Extremity/Trunk Assessment   Upper Extremity Assessment: Overall WFL for tasks assessed           Lower Extremity Assessment: Overall WFL for tasks assessed (No focal UE/LE weakness or numbness/tingling)         Communication   Communication: Interpreter utilized;Prefers language other than English  Cognition Arousal/Alertness: Awake/alert Behavior During Therapy: Anxious (Tearful) Overall Cognitive Status: Difficult to assess  (Unclear baseline and education level)                      General Comments      Exercises        Assessment/Plan    PT Assessment Patient needs continued PT services  PT Diagnosis Difficulty walking;Abnormality of gait   PT Problem List Decreased balance;Decreased mobility;Decreased safety awareness;Other (comment) (Dizziness)  PT Treatment Interventions DME instruction;Gait training;Stair training;Therapeutic activities;Therapeutic exercise;Balance training;Neuromuscular re-education   PT Goals (Current goals can be found in the Care Plan section) Acute Rehab PT Goals Patient Stated Goal: Decrease dizziness PT Goal Formulation: With patient Time For Goal Achievement: 04/30/16 Potential to Achieve Goals: Fair    Frequency Min 2X/week   Barriers to discharge Decreased caregiver support Lives alone. Does have some family that can provide assist but unclear how much assist    Co-evaluation               End of Session Equipment Utilized During Treatment: Gait belt Activity Tolerance: Other (comment) (Limited by dizziness) Patient left: in bed;with call bell/phone within reach;with bed alarm set Nurse Communication: Mobility status;Other (comment) (Dizziness)         Time: [id number] PT Time Calculation (min) (ACUTE ONLY): 23 min   Charges:   PT Evaluation $  PT Eval Moderate Complexity: 1 Procedure     PT G Codes:       Lyndel Safe Khailee Mick PT, DPT  [place] [employer] 04/16/2016, 12:23 PM

## 2016-04-16 NOTE — Progress Notes (Signed)
Spanish interpreter called to assist with admission and assessment.

## 2016-04-17 ENCOUNTER — Inpatient Hospital Stay: Payer: Self-pay

## 2016-04-17 ENCOUNTER — Inpatient Hospital Stay
Admit: 2016-04-17 | Discharge: 2016-04-17 | Disposition: A | Discharge status: Home / Self Care | Payer: Self-pay | Attending: Internal Medicine | Admitting: Internal Medicine

## 2016-04-17 DIAGNOSIS — I614 Nontraumatic intracerebral hemorrhage in cerebellum: Secondary | ICD-10-CM

## 2016-04-17 LAB — RENAL FUNCTION PANEL
ALBUMIN: 2.6 g/dL — AB (ref 3.5–5.0)
Anion gap: 5 (ref 5–15)
BUN: 50 mg/dL — AB (ref 6–20)
CALCIUM: 7.5 mg/dL — AB (ref 8.9–10.3)
CO2: 22 mmol/L (ref 22–32)
CREATININE: 3.29 mg/dL — AB (ref 0.44–1.00)
Chloride: 111 mmol/L (ref 101–111)
GFR calc Af Amer: 17 mL/min — ABNORMAL LOW (ref >60)
GFR calc non Af Amer: 14 mL/min — ABNORMAL LOW (ref >60)
GLUCOSE: 138 mg/dL — AB (ref 65–99)
PHOSPHORUS: 5.4 mg/dL — AB (ref 2.5–4.6)
Potassium: 5 mmol/L (ref 3.5–5.1)
SODIUM: 138 mmol/L (ref 135–145)

## 2016-04-17 LAB — CBC
HCT: 22.7 % — ABNORMAL LOW (ref 35.0–47.0)
Hemoglobin: 7.6 g/dL — ABNORMAL LOW (ref 12.0–16.0)
MCH: 27.8 pg (ref 26.0–34.0)
MCHC: 33.3 g/dL (ref 32.0–36.0)
MCV: 83.6 fL (ref 80.0–100.0)
Platelets: 162 10*3/uL (ref 150–440)
RBC: 2.72 MIL/uL — ABNORMAL LOW (ref 3.80–5.20)
RDW: 15 % — AB (ref 11.5–14.5)
WBC: 5.7 10*3/uL (ref 3.6–11.0)

## 2016-04-17 LAB — GLOMERULAR BASEMENT MEMBRANE ANTIBODIES: GBM Ab: 2 units (ref 0–20)

## 2016-04-17 LAB — PROTEIN ELECTROPHORESIS, SERUM
A/G Ratio: 1 (ref 0.7–1.7)
ALPHA-1-GLOBULIN: 0.2 g/dL (ref 0.0–0.4)
Albumin ELP: 2.6 g/dL — ABNORMAL LOW (ref 2.9–4.4)
Alpha-2-Globulin: 0.8 g/dL (ref 0.4–1.0)
Beta Globulin: 0.7 g/dL (ref 0.7–1.3)
GLOBULIN, TOTAL: 2.5 g/dL (ref 2.2–3.9)
Gamma Globulin: 0.8 g/dL (ref 0.4–1.8)
TOTAL PROTEIN ELP: 5.1 g/dL — AB (ref 6.0–8.5)

## 2016-04-17 LAB — HEPATITIS B CORE ANTIBODY, IGM: Hep B C IgM: NEGATIVE

## 2016-04-17 LAB — GLUCOSE, CAPILLARY
GLUCOSE-CAPILLARY: 133 mg/dL — AB (ref 65–99)
Glucose-Capillary: 220 mg/dL — ABNORMAL HIGH (ref 65–99)
Glucose-Capillary: 181 mg/dL — ABNORMAL HIGH (ref 65–99)
Glucose-Capillary: 161 mg/dL — ABNORMAL HIGH (ref 65–99)

## 2016-04-17 LAB — HEPATITIS B SURFACE ANTIGEN: HEP B S AG: NEGATIVE

## 2016-04-17 LAB — MPO/PR-3 (ANCA) ANTIBODIES: Myeloperoxidase Abs: <9 U/mL (ref 0.0–9.0)

## 2016-04-17 LAB — IRON AND TIBC
Iron: 43 ug/dL (ref 28–170)
Saturation Ratios: 19 % (ref 10.4–31.8)
TIBC: 224 ug/dL — ABNORMAL LOW (ref 250–450)
UIBC: 181 ug/dL

## 2016-04-17 LAB — VITAMIN B12: VITAMIN B 12: 381 pg/mL (ref 180–914)

## 2016-04-17 LAB — C4 COMPLEMENT: COMPLEMENT C4, BODY FLUID: 23 mg/dL (ref 14–44)

## 2016-04-17 LAB — PARATHYROID HORMONE, INTACT (NO CA): PTH: 105 pg/mL — AB (ref 15–65)

## 2016-04-17 LAB — C3 COMPLEMENT: C3 COMPLEMENT: 91 mg/dL (ref 82–167)

## 2016-04-17 LAB — HEPATITIS B SURFACE ANTIBODY,QUALITATIVE: Hep B S Ab: NONREACTIVE

## 2016-04-17 LAB — HEPATITIS C ANTIBODY: HCV Ab: <0.1 s/co ratio (ref 0.0–0.9)

## 2016-04-17 LAB — FOLATE: Folate: 9.4 ng/mL (ref >5.9)

## 2016-04-17 LAB — ANA W/REFLEX: ANA: NEGATIVE

## 2016-04-17 MED ORDER — METOPROLOL TARTRATE 25 MG PO TABS
25.0000 mg | ORAL_TABLET | Freq: Two times a day (BID) | ORAL | Status: DC
  Administered 2016-04-17 – 2016-04-19 (×5): 25 mg via ORAL
  Filled 2016-04-17 (×5): qty 1

## 2016-04-17 MED ORDER — SODIUM CHLORIDE 0.9 % IV SOLN
INTRAVENOUS | Status: DC
  Administered 2016-04-17 (×2): via INTRAVENOUS

## 2016-04-17 MED ORDER — AMLODIPINE BESYLATE 10 MG PO TABS
10.0000 mg | ORAL_TABLET | Freq: Every day | ORAL | Status: DC
  Administered 2016-04-17 – 2016-04-19 (×3): 10 mg via ORAL
  Filled 2016-04-17 (×3): qty 1

## 2016-04-17 MED ORDER — CALCIUM ACETATE (PHOS BINDER) 667 MG PO CAPS
667.0000 mg | ORAL_CAPSULE | Freq: Three times a day (TID) | ORAL | Status: DC
  Administered 2016-04-18 – 2016-04-19 (×4): 667 mg via ORAL
  Filled 2016-04-17 (×5): qty 1

## 2016-04-17 MED ORDER — SODIUM CHLORIDE 0.9 % IV SOLN: INTRAVENOUS | Status: DC

## 2016-04-17 NOTE — Progress Notes (Signed)
[facility]  ROUNDING NOTE   Subjective:   History taken with Spanish Interpreter. Daughter at bedside. Records from [facility] obtained. Nephrotic range proteinuria and hematuria. There was concern for glomulonephritis however she was lost to follow up. No biopsy.   Continues to complain of dizziness, sleepiness and weakness.   Objective:  Vital signs in last 24 hours:  Temp:  [97.9 F (36.6 C)-98.7 F (37.1 C)] 98.7 F (37.1 C) (05/10 0546) Pulse Rate:  [75-77] 75 (05/10 0546) Resp:  [16-20] 18 (05/10 0546) BP: (152-163)/(62-66) 163/62 mmHg (05/10 0546) SpO2:  [92 %-97 %] 93 % (05/10 0546) Weight:  [47.219 kg (104 lb 1.6 oz)] 47.219 kg (104 lb 1.6 oz) (05/09 1800)  Weight change: -0.408 kg (-14.4 oz) Filed Weights   04/15/16 1017 04/16/16 1800  Weight: 47.628 kg (105 lb) 47.219 kg (104 lb 1.6 oz)    Intake/Output: I/O last 3 completed shifts: In: 2656.9 [P.O.:360; I.V.:2296.9] Out: 1600 [Urine:1600]   Intake/Output this shift:  Total I/O In: 433 [P.O.:240; I.V.:193] Out: -   Physical Exam: General: Weak looking, laying in bed  Head: Normocephalic, atraumatic. Moist oral mucosal membranes  Eyes: Anicteric, PERRL  Neck: Supple, trachea midline  Lungs:  Clear to auscultation  Heart: Regular rate and rhythm  Abdomen:  Soft, nontender, obese  Extremities: no peripheral edema.  Neurologic: Nonfocal, moving all four extremities  Skin: No lesions       Basic Metabolic Panel:  Recent Labs Lab 04/15/16 1025 04/16/16 0727 04/17/16 0336  NA 137 141 138  K 4.3 4.5 5.0  CL 105 113* 111  CO2 '24 22 22  ' GLUCOSE 234* 142* 138*  BUN 48* 44* 50*  CREATININE 3.65* 3.34* 3.29*  CALCIUM 8.2* 7.6* 7.5*  PHOS  --   --  5.4*    Liver Function Tests:  Recent Labs Lab 04/15/16 1025 04/17/16 0336  AST 19  --   ALT 13*  --   ALKPHOS 131*  --   BILITOT 0.8  --   PROT 6.4*  --   ALBUMIN 3.3* 2.6*    Recent Labs Lab 04/15/16 1025  LIPASE 31    No results for input(s): AMMONIA in the last 168 hours.  CBC:  Recent Labs Lab 04/15/16 1025 04/16/16 0727 04/17/16 0336  WBC 6.3 5.2 5.7  HGB 10.3* 8.1* 7.6*  HCT 30.8* 24.6* 22.7*  MCV 82.9 83.1 83.6  PLT 214 177 162    Cardiac Enzymes:  Recent Labs Lab 04/15/16 1025 04/15/16 1928 04/16/16 0114 04/16/16 0727  TROPONINI <0.03 <0.03 0.03 <0.03    BNP: Invalid input(s): POCBNP  CBG:  Recent Labs Lab 04/16/16 0735 04/16/16 1144 04/16/16 1646 04/16/16 2109 04/17/16 0755  GLUCAP 126* 202* 241* 155* 133*    Microbiology: No results found for this or any previous visit.  Coagulation Studies: No results for input(s): LABPROT, INR in the last 72 hours.  Urinalysis:  Recent Labs  04/15/16 1025  COLORURINE YELLOW*  LABSPEC 1.005  PHURINE 6.0  GLUCOSEU 2+*  HGBUR NEGATIVE  BILIRUBINUR NEGATIVE  KETONESUR NEGATIVE  PROTEINUR 3+*  NITRITE NEGATIVE  LEUKOCYTESUR NEGATIVE      Imaging: Ct Abdomen Pelvis Wo Contrast  04/15/2016  CLINICAL DATA:  Right lower quadrant pain for 3 months EXAM: CT ABDOMEN AND PELVIS WITHOUT CONTRAST TECHNIQUE: Multidetector CT imaging of the abdomen and pelvis was performed following the standard protocol without IV contrast. COMPARISON:  None. FINDINGS: Lower chest:  Small pleural effusions are identified bilaterally. Hepatobiliary: No  suspicious liver abnormalities identified. The gallbladder appears normal. There is no biliary dilatation. Pancreas: No mass or inflammatory process identified on this un-enhanced exam. Spleen: Within normal limits in size. Adrenals/Urinary Tract: No evidence of urolithiasis or hydronephrosis. No definite mass visualized on this un-enhanced exam. Stomach/Bowel: The stomach is within normal limits. The small bowel loops have a normal course and caliber. No obstruction. Normal appearance of the colon. The appendix is visualized and appears normal. Vascular/Lymphatic: Calcified atherosclerotic disease  involves the abdominal aorta. No aneurysm. No enlarged retroperitoneal or mesenteric adenopathy. No enlarged pelvic or inguinal lymph nodes. Reproductive: No mass or other significant abnormality. Other: There is no ascites or focal fluid collections within the abdomen or pelvis. There is a umbilical hernia which contains fat only. Musculoskeletal: Degenerative disc disease is noted within the lower lumbar spine at the L5-S1 level. IMPRESSION: 1. No acute findings identified within the abdomen or pelvis. 2. Aortic atherosclerosis 3. Small umbilical hernia contains fat only. 4. Small bilateral pleural effusions. Electronically Signed   By: Kerby Moors M.D.   On: 04/15/2016 14:32   US Pelvis Complete  04/16/2016  CLINICAL DATA:  Two days of vaginal bleeding ; the patient was unable to void for the endovaginal portion of the study. EXAM: TRANSABDOMINAL ULTRASOUND OF PELVIS TECHNIQUE: Transabdominal ultrasound examination of the pelvis was performed including evaluation of the uterus, ovaries, adnexal regions, and pelvic cul-de-sac. COMPARISON:  Abdominal and pelvic CT scan of Apr 15, 2016. FINDINGS: Uterus Measurements: 5.6 x 2.9 x 4.6 cm. No fibroids or other mass visualized. Endometrium Thickness: 2.0 mm.  No focal abnormality visualized. Right ovary Measurements: 2.3 x 1.1 x 1.8 cm. Normal appearance/no adnexal mass. Left ovary Measurements: 1.7 x 1.0 x 1.4 cm. Normal appearance/no adnexal mass. Other findings:  No abnormal free fluid. IMPRESSION: Normal appearance of the uterus, the endometrium, and both ovaries. No endometrial masses or other abnormalities are observed. Electronically Signed   By: David  Martinique M.D.   On: 04/16/2016 10:53   US Aorta  04/15/2016  CLINICAL DATA:  Abdominal pain.  Evaluate for aortic dissection. EXAM: ULTRASOUND OF ABDOMINAL AORTA TECHNIQUE: Ultrasound examination of the abdominal aorta was performed to evaluate for abdominal aortic aneurysm. COMPARISON:  Noncontrast abdominal  CT 04/15/2016 FINDINGS: Abdominal Aorta No abdominal aortic aneurysm. The distal abdominal aorta measures up to 1.3 cm with atherosclerotic disease. Mid abdominal aorta measures up to 1.4 cm. Maximum size of the proximal abdominal aorta is 1.8 cm. This modality is not optimal for evaluation for an aortic dissection. However, a dissection is thought to be unlikely based on the size of this vessel and there is no clear evidence for a false channel. Iliac arteries: Right common iliac artery measures up to 0.8 cm with atherosclerotic calcifications. The right common iliac artery is patent. Left common iliac artery measures up to the 0.7 cm and patent. IMPRESSION: Negative for an abdominal aortic aneurysm. The abdominal aorta is small with atherosclerotic disease. Limited evaluation for an aortic dissection based on the modality and size of the aorta. An aortic dissection is thought to be unlikely based on the color Doppler images and size of the aorta. However, noncontrast MR would be more definitive evaluation for an aortic dissection if the patient cannot receive intravenous contrast. Electronically Signed   By: Markus Daft M.D.   On: 04/15/2016 16:01     Medications:   . sodium chloride 50 mL/hr at 04/17/16 0705   . amLODipine  10 mg Oral Daily  .  atorvastatin  40 mg Oral Daily  . cloNIDine  0.1 mg Oral BID  . docusate sodium  100 mg Oral BID  . gabapentin  300 mg Oral TID  . insulin aspart  0-5 Units Subcutaneous QHS  . insulin aspart  0-9 Units Subcutaneous TID WC  . metoprolol tartrate  25 mg Oral BID   acetaminophen **OR** acetaminophen, hydrALAZINE, meclizine, ondansetron **OR** ondansetron (ZOFRAN) IV, polyethylene glycol  Assessment/ Plan:  [name] is a 60 y.o. Hispanic female with insulin dependent diabetes mellitus type II, diabetic retinopathy, hypertension, hyperlipidemia, hemorrhagic cystitis, migraine headaches, osteoarthritis, osteoporosis who was admitted to [facility]  on 04/15/2016   1. Acute Renal Failure on chronic kidney disease stage III with proteinuria: baseline creatinine of 1.79, eGFR of 30 from 09/20/2015. However there is concern of progression of disease.  - Discussed dialysis and renal biopsy. Will need outpatient close nephrology follow up - Renally dose all medications. Avoid nehprotoxic agents. Patient instructed to avoid nonsteroidal anti-inflammatory agents.  - Urine studies are pending.  - Continue IV fluids for now. Encourage PO intake - Pending SPEP/UPEP, ANA, ANCA, anti-GBM, hepatitis panel and serum complements.   2. Hypertension: elevated on admission. Now under good control.  - Current regimen of amlodipine, metoprolol and clondine. Will hold clonidine today due to complaints of dizziness.  - Home regimen of lisinopril.   3. Diabetes Mellitus type II with chronic kidney disease: insulin dependent. Hemoglobin A1c 7.2% - continue glucose control.   4. Secondary Hyperparathyroidism: with pending PTH. Phos elevated at 5.4 and calcium low at 7.5 - Start calcium acetate with meals.    LOS: 2 Nolia Tschantz 5/10/201711:00 AM

## 2016-04-17 NOTE — Care Management Note (Signed)
Case Management Note  Patient Details  Name: Anita Contreras MRN: [id number] Date of Birth: [birth year]  Subjective/Objective:               Patient admitted with hypertensive urgency.    Patient is spanish speaking only.  Interpreter used for assessment.  History obtained by patient and family who is at bedside.  Patient lives at home alone in a trailer.  Family live locally.  Patient is alone during the day.  Granddaughters rotate staying with her over night.  Her daughter provides transportation when needed.  Patient states that she has no source of income.  Patient states that she goes to the [facility], and obtains her medication from the pharmacy there.  Patient does not have any equipment at home.  PT is currently recommending SNF.  Patient is declining SNF placement, and does not have insurance coverage. If home health services indicated at discharge will pursue charity care.  Patient denies issues obtaining her medications and states that her family provides financial support and purchase her medications.    Action/Plan: I contacted [facility].  Patient has not seen MD since 11/17/15.  Patient out of pocket cost per visit $120.  Per clinic patient was placed in highest sliding scale bracket due to patient and family did not bring and financial documentation to initial appointment.  Per clinic if financial documentation were provided sliding scale would be adjusted.   Last time patient picked up medications from the pharmacy was 02/13/16, that was a 30 day supply.  Dr. Posey Pronto updated  RNCM following  Expected Discharge Date:                  Expected Discharge Plan:     In-House Referral:     Discharge planning Services     Post Acute Care Choice:    Choice offered to:     DME Arranged:    DME Agency:     HH Arranged:    [place] Agency:     Status of Service:     Medicare Important Message Given:    Date Medicare IM Given:    Medicare IM give by:    Date Additional Medicare  IM Given:    Additional Medicare Important Message give by:     If discussed at [place] of Stay Meetings, dates discussed:    Additional Comments:  Beverly Sessions, RN 04/17/2016, 2:03 PM

## 2016-04-17 NOTE — Progress Notes (Signed)
Patient ID: Anita Contreras, female   DOB: 10/19/1956, 60 y.o.   MRN: [id number] [place] [facility] [place] NAME: Anita Contreras    MR#:  [id number]  DATE OF BIRTH:  08-05-1956  SUBJECTIVE:  Via interpreter. Symptoms of dizziness/vertigo improving No more abdominal pain No vaginal bleeding Family and the room REVIEW OF SYSTEMS:   Review of Systems  Constitutional: Negative for fever, chills and weight loss.  HENT: Negative for ear discharge, ear pain and nosebleeds.   Eyes: Negative for blurred vision, pain and discharge.  Respiratory: Negative for sputum production, shortness of breath, wheezing and stridor.   Cardiovascular: Negative for chest pain, palpitations, orthopnea and PND.  Gastrointestinal: Negative for nausea, vomiting, abdominal pain and diarrhea.  Genitourinary: Negative for urgency and frequency.  Musculoskeletal: Negative for back pain and joint pain.  Neurological: Negative for sensory change, speech change, focal weakness and weakness.  Psychiatric/Behavioral: Negative for depression and hallucinations. The patient is not nervous/anxious.    Tolerating Diet:yes Tolerating PT: rec rehab  DRUG ALLERGIES:  No Known Allergies  VITALS:  Blood pressure 163/62, pulse 75, temperature 98.7 F (37.1 C), temperature source Oral, resp. rate 18, height 4\' 5"  (1.346 m), weight 47.219 kg (104 lb 1.6 oz), last menstrual period 09/19/2001, SpO2 93 %.  PHYSICAL EXAMINATION:   Physical Exam  GENERAL:  60 y.o.-year-old patient lying in the bed with no acute distress.  EYES: Pupils equal, round, reactive to light and accommodation. No scleral icterus. Extraocular muscles intact. Nystagmus++ HEENT: Head atraumatic, normocephalic. Oropharynx and nasopharynx clear.  NECK:  Supple, no jugular venous distention. No thyroid enlargement, no tenderness.  LUNGS: Normal breath sounds bilaterally, no wheezing, rales, rhonchi.  No use of accessory muscles of respiration.  CARDIOVASCULAR: S1, S2 normal. No murmurs, rubs, or gallops.  ABDOMEN: Soft, nontender, nondistended. Bowel sounds present. No organomegaly or mass.  EXTREMITIES: No cyanosis, clubbing or edema b/l.    NEUROLOGIC: Cranial nerves II through XII are intact. No focal Motor or sensory deficits b/l.   PSYCHIATRIC:  patient is alert and oriented x 3.  SKIN: No obvious rash, lesion, or ulcer.   LABORATORY PANEL:  CBC  Recent Labs Lab 04/17/16 0336  WBC 5.7  HGB 7.6*  HCT 22.7*  PLT 162    Chemistries   Recent Labs Lab 04/15/16 1025  04/17/16 0336  NA 137  < > 138  K 4.3  < > 5.0  CL 105  < > 111  CO2 24  < > 22  GLUCOSE 234*  < > 138*  BUN 48*  < > 50*  CREATININE 3.65*  < > 3.29*  CALCIUM 8.2*  < > 7.5*  AST 19  --   --   ALT 13*  --   --   ALKPHOS 131*  --   --   BILITOT 0.8  --   --   < > = values in this interval not displayed. Cardiac Enzymes  Recent Labs Lab 04/16/16 0727  TROPONINI <0.03   RADIOLOGY:  Ct Abdomen Pelvis Wo Contrast  04/15/2016  CLINICAL DATA:  Right lower quadrant pain for 3 months EXAM: CT ABDOMEN AND PELVIS WITHOUT CONTRAST TECHNIQUE: Multidetector CT imaging of the abdomen and pelvis was performed following the standard protocol without IV contrast. COMPARISON:  None. FINDINGS: Lower chest:  Small pleural effusions are identified bilaterally. Hepatobiliary: No suspicious liver abnormalities identified. The gallbladder appears normal. There is no biliary dilatation. Pancreas: No  mass or inflammatory process identified on this un-enhanced exam. Spleen: Within normal limits in size. Adrenals/Urinary Tract: No evidence of urolithiasis or hydronephrosis. No definite mass visualized on this un-enhanced exam. Stomach/Bowel: The stomach is within normal limits. The small bowel loops have a normal course and caliber. No obstruction. Normal appearance of the colon. The appendix is visualized and appears normal.  Vascular/Lymphatic: Calcified atherosclerotic disease involves the abdominal aorta. No aneurysm. No enlarged retroperitoneal or mesenteric adenopathy. No enlarged pelvic or inguinal lymph nodes. Reproductive: No mass or other significant abnormality. Other: There is no ascites or focal fluid collections within the abdomen or pelvis. There is a umbilical hernia which contains fat only. Musculoskeletal: Degenerative disc disease is noted within the lower lumbar spine at the L5-S1 level. IMPRESSION: 1. No acute findings identified within the abdomen or pelvis. 2. Aortic atherosclerosis 3. Small umbilical hernia contains fat only. 4. Small bilateral pleural effusions. Electronically Signed   By: Kerby Moors M.D.   On: 04/15/2016 14:32   [name]  04/17/2016  CLINICAL DATA:  Vertigo.  Hypertensive urgency and renal failure. EXAM: MRI HEAD WITHOUT CONTRAST TECHNIQUE: Multiplanar, multiecho pulse sequences of the brain and surrounding structures were obtained without intravenous contrast. COMPARISON:  None. FINDINGS: There is mild motion artifact throughout. There is no evidence of acute infarct, midline shift, or extra-axial fluid collection. Slight diffuse sulcal prominence is compatible with mild cerebral atrophy. Ventricles are normal in size. No significant cerebral white matter disease is seen. There is a 3 mm focus of susceptibility artifact in the inferior right cerebellum with mild surrounding T2 hyperintensity/edema. There is no evidence of intracranial hemorrhage elsewhere. Orbits are unremarkable. There is mild left maxillary sinus mucosal thickening. Mastoid air cells are clear. Major intracranial vascular flow voids are preserved. IMPRESSION: 1. 3 mm focus of blood products in the right cerebellum. This is of uncertain age by MRI, however a small acute/subacute hemorrhage is a concern given evidence of mild surrounding edema and patient's current vertigo. This may be hypertensive in etiology  or possibly due to an underlying cavernoma. Noncontrast head CT is recommended. 2. No other evidence of acute intracranial abnormality. These results will be called to the ordering clinician or representative by the Radiologist Assistant, and communication documented in the PACS or zVision Dashboard. Electronically Signed   By: Logan Bores M.D.   On: 04/17/2016 12:12   US Pelvis Complete  04/16/2016  CLINICAL DATA:  Two days of vaginal bleeding ; the patient was unable to void for the endovaginal portion of the study. EXAM: TRANSABDOMINAL ULTRASOUND OF PELVIS TECHNIQUE: Transabdominal ultrasound examination of the pelvis was performed including evaluation of the uterus, ovaries, adnexal regions, and pelvic cul-de-sac. COMPARISON:  Abdominal and pelvic CT scan of Apr 15, 2016. FINDINGS: Uterus Measurements: 5.6 x 2.9 x 4.6 cm. No fibroids or other mass visualized. Endometrium Thickness: 2.0 mm.  No focal abnormality visualized. Right ovary Measurements: 2.3 x 1.1 x 1.8 cm. Normal appearance/no adnexal mass. Left ovary Measurements: 1.7 x 1.0 x 1.4 cm. Normal appearance/no adnexal mass. Other findings:  No abnormal free fluid. IMPRESSION: Normal appearance of the uterus, the endometrium, and both ovaries. No endometrial masses or other abnormalities are observed. Electronically Signed   By: David  Martinique M.D.   On: 04/16/2016 10:53   US Aorta  04/15/2016  CLINICAL DATA:  Abdominal pain.  Evaluate for aortic dissection. EXAM: ULTRASOUND OF ABDOMINAL AORTA TECHNIQUE: Ultrasound examination of the abdominal aorta was performed to evaluate for abdominal  aortic aneurysm. COMPARISON:  Noncontrast abdominal CT 04/15/2016 FINDINGS: Abdominal Aorta No abdominal aortic aneurysm. The distal abdominal aorta measures up to 1.3 cm with atherosclerotic disease. Mid abdominal aorta measures up to 1.4 cm. Maximum size of the proximal abdominal aorta is 1.8 cm. This modality is not optimal for evaluation for an aortic dissection.  However, a dissection is thought to be unlikely based on the size of this vessel and there is no clear evidence for a false channel. Iliac arteries: Right common iliac artery measures up to 0.8 cm with atherosclerotic calcifications. The right common iliac artery is patent. Left common iliac artery measures up to the 0.7 cm and patent. IMPRESSION: Negative for an abdominal aortic aneurysm. The abdominal aorta is small with atherosclerotic disease. Limited evaluation for an aortic dissection based on the modality and size of the aorta. An aortic dissection is thought to be unlikely based on the color Doppler images and size of the aorta. However, noncontrast MR would be more definitive evaluation for an aortic dissection if the patient cannot receive intravenous contrast. Electronically Signed   By: Markus Daft M.D.   On: 04/15/2016 16:01   ASSESSMENT AND PLAN:   Madlen Ramdial is a 60 y.o. female with a known history of uncontrolled HTN, DM, osteoporosis, diabetic neuropathy, retinopathy presents to the hospital secondary to worsening right lower quadrant abdominal pain and today history of vaginal bleeding.  #1 hypertensive urgency- history of noncompliance with medications. Chronically elevated blood pressure. -Due to renal failure, hold lisinopril. -Started on clonidine and also Norvasc. Also IV hydralazine when necessary  #2 acute renal failure on CKD-also progressively worsening CKD due to uncontrolled hypertensionAnd nephrotic range proteinuria.  -CT of the abdomen with no obstruction or hydronephrosis noted. -Gentle hydration. Avoid nephrotoxins.  Hold lisinopril. -Dr.kolluru spoke with patient regarding renal biopsy and possibly dialysis down the road given no improvement in GFR and nephrotic range proteinuria  #3 right lower quadrant abdominal pain-could be muscular pain. CT of the abdomen without contrast does not show any acute findings. Appendix seems normal. However due to  significant tenderness, we'll consult surgery. Also associated with nausea and vomiting according to patient. -pt pain resolved  #4 diabetes mellitus-check A1c. Patient not sure of what she takes at home for her diabetes. On 70/30 insulin. -Started on sliding scale insulin here and verify home medications  #5 vaginal bleeding-postmenopausal vaginal bleeding. Spontaneously resolved. Hemoglobin is stable. -Transvaginal ultrasound ordered. -no acute issues, can be followed up as an outpatient  #6 neuropathy-continue gabapentin  #7 DVT prophylaxis-started on subcutaneous heparin  #8 vertigo Given risk factors of HTN and DM-2 will do MRI to r/o central cause for vertigo -prn meclizine -MRI of the brain shows right sided basilar acute subacute hemorrhage with mild peripheral edema. Patient is symptomatically much better. -Case discussed with Dr. Doy Mince from neurology hold the patient in consultation   Case discussed with Care Management/Social Worker. Management plans discussed with the patient, family and they are in agreement.  CODE STATUS: full DVT Prophylaxis: lovenox TOTAL TIME TAKING CARE OF THIS PATIENT: 40 minutes.  >50% time spent on counselling and coordination of care  POSSIBLE D/C IN 2-3 DAYS, DEPENDING ON CLINICAL CONDITION.  Note: This dictation was prepared with Dragon dictation along with smaller phrase technology. Any transcriptional errors that result from this process are unintentional.  Phyllip Claw M.D on 04/17/2016 at 12:25 PM  Between 7am to 6pm - Pager - [phone]  After 6pm go to [email or web address] - [place]  [facility]  Office  [phone]  CC: Primary care physician; WHITE, Arlie Solomons, FNP

## 2016-04-17 NOTE — Consult Note (Signed)
Referring Physician: Posey Pronto    Chief Complaint: Dizziness  Information obtained through interpreter HPI: Anita Contreras is an 60 y.o. female who presented on 5/8 with complaints of abdominal pain and vaginal bleeding.  Was also noted to have markedly elevated blood pressure.  During hospitalization also complained of dizziness and nausea.  It appears this has been present for years and worsened over the past few months.  Has been given Meclizine with improvement in her symptoms.  Initial NIHSS of 0.    ICH Score: 1      Date last known well: Unable to determine Time last known well: Unable to determine tPA Given: No: ICH, unable to determine LKW  MRankin: 0  Past Medical History  Diagnosis Date  . Acute cystitis with hematuria   . Neuropathy ([place])   . Migraine variant   . Cataracts, bilateral   . Diabetic retinopathy, background ([place])   . Dizziness   . Vitamin D deficiency   . Personal history of noncompliance with medical treatment, presenting hazards to health   . Type II diabetes mellitus with ophthalmic manifestations, uncontrolled ([place])   . Hyperlipidemia   . OP (osteoporosis)   . Hypertension   . CKD (chronic kidney disease)     History reviewed. No pertinent past surgical history.  Family History  Problem Relation Age of Onset  . Hypertension Mother   . Hyperlipidemia Mother    Social History:  reports that she has never smoked. She does not have any smokeless tobacco history on file. She reports that she does not drink alcohol or use illicit drugs.  Allergies: No Known Allergies  Medications:  I have reviewed the patient's current medications. Prior to Admission:  Prescriptions prior to admission  Medication Sig Dispense Refill Last Dose  . acetaminophen (TYLENOL) 500 MG tablet Take 1,000 mg by mouth every 6 (six) hours as needed for mild pain or headache.   04/15/2016 at 0800  . atorvastatin (LIPITOR) 40 MG tablet Take 40 mg by mouth at bedtime.     04/14/2016 at Unknown time  . insulin NPH-regular Human (NOVOLIN 70/30) (70-30) 100 UNIT/ML injection Inject 15 Units into the skin daily.    04/15/2016 at Unknown time  . lisinopril (PRINIVIL,ZESTRIL) 40 MG tablet Take 40 mg by mouth daily.   04/15/2016 at Unknown time   Scheduled: . amLODipine  10 mg Oral Daily  . atorvastatin  40 mg Oral Daily  . calcium acetate  667 mg Oral TID WC  . docusate sodium  100 mg Oral BID  . insulin aspart  0-5 Units Subcutaneous QHS  . insulin aspart  0-9 Units Subcutaneous TID WC  . metoprolol tartrate  25 mg Oral BID    ROS: History obtained from the patient  General ROS: negative for - chills, fatigue, fever, night sweats, weight gain or weight loss Psychological ROS: negative for - behavioral disorder, hallucinations, memory difficulties, mood swings or suicidal ideation Ophthalmic ROS: negative for - blurry vision, double vision, eye pain or loss of vision ENT ROS: as noted in HPI Allergy and Immunology ROS: negative for - hives or itchy/watery eyes Hematological and Lymphatic ROS: negative for - bleeding problems, bruising or swollen lymph nodes Endocrine ROS: negative for - galactorrhea, hair pattern changes, polydipsia/polyuria or temperature intolerance Respiratory ROS: negative for - cough, hemoptysis, shortness of breath or wheezing Cardiovascular ROS: negative for - chest pain, dyspnea on exertion, edema or irregular heartbeat Gastrointestinal ROS: as noted in HPI Genito-Urinary ROS: as noted in  HPI Musculoskeletal ROS: negative for - joint swelling or muscular weakness Neurological ROS: as noted in HPI, intermittent headaches Dermatological ROS: negative for rash and skin lesion changes  Physical Examination: Blood pressure 163/62, pulse 75, temperature 98.7 F (37.1 C), temperature source Oral, resp. rate 18, height 4\' 5"  (1.346 m), weight 47.219 kg (104 lb 1.6 oz), last menstrual period 09/19/2001, SpO2 93 %.  HEENT-  Normocephalic, no  lesions, without obvious abnormality.  Normal external eye and conjunctiva.  Normal TM's bilaterally.  Normal auditory canals and external ears. Normal external nose, mucus membranes and septum.  Normal pharynx. Cardiovascular- S1, S2 normal, pulses palpable throughout   Lungs- chest clear, no wheezing, rales, normal symmetric air entry Abdomen- soft, non-tender; bowel sounds normal; no masses,  no organomegaly Extremities- no edema Lymph-no adenopathy palpable Musculoskeletal-no joint tenderness, deformity or swelling Skin-warm and dry, no hyperpigmentation, vitiligo, or suspicious lesions  Neurological Examination Mental Status: Alert, oriented, thought content appropriate.  Speech fluent without evidence of aphasia.  Able to follow simple commands but requires extensive reinforcement for 3-step commands even with an interpreter Cranial Nerves: II: Discs flat bilaterally; Visual fields grossly normal, pupils equal, round, reactive to light and accommodation III,IV, VI: ptosis not present, extra-ocular motions intact bilaterally V,VII: smile symmetric, facial light touch sensation normal bilaterally VIII: hearing normal bilaterally IX,X: gag reflex present XI: bilateral shoulder shrug XII: midline tongue extension Motor: Right : Upper extremity   5/5    Left:     Upper extremity   5/5  Lower extremity   5/5     Lower extremity   5/5 Tone and bulk:normal tone throughout; no atrophy noted Sensory: Pinprick and light touch intact throughout, bilaterally Deep Tendon Reflexes: 2+ in the UE's and absent in the LE's Plantars: Right: downgoing   Left: downgoing Cerebellar: Normal finger-to-nose and normal heel-to-shin testing bilaterally Gait: not tested due to safety concerns   Laboratory Studies:  Basic Metabolic Panel:  Recent Labs Lab 04/15/16 1025 04/16/16 0727 04/17/16 0336  NA 137 141 138  K 4.3 4.5 5.0  CL 105 113* 111  CO2 24 22 22   GLUCOSE 234* 142* 138*  BUN 48* 44*  50*  CREATININE 3.65* 3.34* 3.29*  CALCIUM 8.2* 7.6* 7.5*  PHOS  --   --  5.4*    Liver Function Tests:  Recent Labs Lab 04/15/16 1025 04/17/16 0336  AST 19  --   ALT 13*  --   ALKPHOS 131*  --   BILITOT 0.8  --   PROT 6.4*  --   ALBUMIN 3.3* 2.6*    Recent Labs Lab 04/15/16 1025  LIPASE 31   No results for input(s): AMMONIA in the last 168 hours.  CBC:  Recent Labs Lab 04/15/16 1025 04/16/16 0727 04/17/16 0336  WBC 6.3 5.2 5.7  HGB 10.3* 8.1* 7.6*  HCT 30.8* 24.6* 22.7*  MCV 82.9 83.1 83.6  PLT 214 177 162    Cardiac Enzymes:  Recent Labs Lab 04/15/16 1025 04/15/16 1928 04/16/16 0114 04/16/16 0727  TROPONINI <0.03 <0.03 0.03 <0.03    BNP: Invalid input(s): POCBNP  CBG:  Recent Labs Lab 04/16/16 1144 04/16/16 1646 04/16/16 2109 04/17/16 0755 04/17/16 1217  GLUCAP 202* 241* 155* 133* 220*    Microbiology: No results found for this or any previous visit.  Coagulation Studies: No results for input(s): LABPROT, INR in the last 72 hours.  Urinalysis:  Recent Labs Lab 04/15/16 1025  COLORURINE YELLOW*  LABSPEC 1.005  PHURINE 6.0  GLUCOSEU 2+*  HGBUR NEGATIVE  BILIRUBINUR NEGATIVE  KETONESUR NEGATIVE  PROTEINUR 3+*  NITRITE NEGATIVE  LEUKOCYTESUR NEGATIVE    Lipid Panel: No results found for: CHOL, TRIG, HDL, CHOLHDL, VLDL, LDLCALC  HgbA1C:  Lab Results  Component Value Date   HGBA1C 7.2* 04/15/2016    Urine Drug Screen:  No results found for: LABOPIA, COCAINSCRNUR, LABBENZ, AMPHETMU, THCU, LABBARB  Alcohol Level: No results for input(s): ETH in the last 168 hours.  Other results: EKG: sinus rhythm at 76 bpm, LAE.  Imaging: Ct Abdomen Pelvis Wo Contrast  04/15/2016  CLINICAL DATA:  Right lower quadrant pain for 3 months EXAM: CT ABDOMEN AND PELVIS WITHOUT CONTRAST TECHNIQUE: Multidetector CT imaging of the abdomen and pelvis was performed following the standard protocol without IV contrast. COMPARISON:  None. FINDINGS:  Lower chest:  Small pleural effusions are identified bilaterally. Hepatobiliary: No suspicious liver abnormalities identified. The gallbladder appears normal. There is no biliary dilatation. Pancreas: No mass or inflammatory process identified on this un-enhanced exam. Spleen: Within normal limits in size. Adrenals/Urinary Tract: No evidence of urolithiasis or hydronephrosis. No definite mass visualized on this un-enhanced exam. Stomach/Bowel: The stomach is within normal limits. The small bowel loops have a normal course and caliber. No obstruction. Normal appearance of the colon. The appendix is visualized and appears normal. Vascular/Lymphatic: Calcified atherosclerotic disease involves the abdominal aorta. No aneurysm. No enlarged retroperitoneal or mesenteric adenopathy. No enlarged pelvic or inguinal lymph nodes. Reproductive: No mass or other significant abnormality. Other: There is no ascites or focal fluid collections within the abdomen or pelvis. There is a umbilical hernia which contains fat only. Musculoskeletal: Degenerative disc disease is noted within the lower lumbar spine at the L5-S1 level. IMPRESSION: 1. No acute findings identified within the abdomen or pelvis. 2. Aortic atherosclerosis 3. Small umbilical hernia contains fat only. 4. Small bilateral pleural effusions. Electronically Signed   By: Kerby Moors M.D.   On: 04/15/2016 14:32   [name]  04/17/2016  CLINICAL DATA:  Vertigo.  Hypertensive urgency and renal failure. EXAM: MRI HEAD WITHOUT CONTRAST TECHNIQUE: Multiplanar, multiecho pulse sequences of the brain and surrounding structures were obtained without intravenous contrast. COMPARISON:  None. FINDINGS: There is mild motion artifact throughout. There is no evidence of acute infarct, midline shift, or extra-axial fluid collection. Slight diffuse sulcal prominence is compatible with mild cerebral atrophy. Ventricles are normal in size. No significant cerebral white  matter disease is seen. There is a 3 mm focus of susceptibility artifact in the inferior right cerebellum with mild surrounding T2 hyperintensity/edema. There is no evidence of intracranial hemorrhage elsewhere. Orbits are unremarkable. There is mild left maxillary sinus mucosal thickening. Mastoid air cells are clear. Major intracranial vascular flow voids are preserved. IMPRESSION: 1. 3 mm focus of blood products in the right cerebellum. This is of uncertain age by MRI, however a small acute/subacute hemorrhage is a concern given evidence of mild surrounding edema and patient's current vertigo. This may be hypertensive in etiology or possibly due to an underlying cavernoma. Noncontrast head CT is recommended. 2. No other evidence of acute intracranial abnormality. These results will be called to the ordering clinician or representative by the Radiologist Assistant, and communication documented in the PACS or zVision Dashboard. Electronically Signed   By: Logan Bores M.D.   On: 04/17/2016 12:12   US Pelvis Complete  04/16/2016  CLINICAL DATA:  Two days of vaginal bleeding ; the patient was unable to void for the endovaginal portion  of the study. EXAM: TRANSABDOMINAL ULTRASOUND OF PELVIS TECHNIQUE: Transabdominal ultrasound examination of the pelvis was performed including evaluation of the uterus, ovaries, adnexal regions, and pelvic cul-de-sac. COMPARISON:  Abdominal and pelvic CT scan of Apr 15, 2016. FINDINGS: Uterus Measurements: 5.6 x 2.9 x 4.6 cm. No fibroids or other mass visualized. Endometrium Thickness: 2.0 mm.  No focal abnormality visualized. Right ovary Measurements: 2.3 x 1.1 x 1.8 cm. Normal appearance/no adnexal mass. Left ovary Measurements: 1.7 x 1.0 x 1.4 cm. Normal appearance/no adnexal mass. Other findings:  No abnormal free fluid. IMPRESSION: Normal appearance of the uterus, the endometrium, and both ovaries. No endometrial masses or other abnormalities are observed. Electronically Signed    By: David  Martinique M.D.   On: 04/16/2016 10:53   US Aorta  04/15/2016  CLINICAL DATA:  Abdominal pain.  Evaluate for aortic dissection. EXAM: ULTRASOUND OF ABDOMINAL AORTA TECHNIQUE: Ultrasound examination of the abdominal aorta was performed to evaluate for abdominal aortic aneurysm. COMPARISON:  Noncontrast abdominal CT 04/15/2016 FINDINGS: Abdominal Aorta No abdominal aortic aneurysm. The distal abdominal aorta measures up to 1.3 cm with atherosclerotic disease. Mid abdominal aorta measures up to 1.4 cm. Maximum size of the proximal abdominal aorta is 1.8 cm. This modality is not optimal for evaluation for an aortic dissection. However, a dissection is thought to be unlikely based on the size of this vessel and there is no clear evidence for a false channel. Iliac arteries: Right common iliac artery measures up to 0.8 cm with atherosclerotic calcifications. The right common iliac artery is patent. Left common iliac artery measures up to the 0.7 cm and patent. IMPRESSION: Negative for an abdominal aortic aneurysm. The abdominal aorta is small with atherosclerotic disease. Limited evaluation for an aortic dissection based on the modality and size of the aorta. An aortic dissection is thought to be unlikely based on the color Doppler images and size of the aorta. However, noncontrast MR would be more definitive evaluation for an aortic dissection if the patient cannot receive intravenous contrast. Electronically Signed   By: Markus Daft M.D.   On: 04/15/2016 16:01    Assessment: 60 y.o. female presenting with complaints of abdominal pain and vaginal bleeding, found to be hypertensive with eventual complaints of dizziness as well.  MRI of the brain personally reviewed and shows a small right cerebellar hemorrhage with surrounding edema. No evidence of ventricular compromise.  Patient stable and asymptomatic at this time.  Likely due to hypertension.   A1c 7.2  Stroke Risk Factors - diabetes mellitus,  hyperlipidemia and hypertension  Plan: 1. Fasting lipid panel 2. PT consult, OT consult, Speech consult 3. Echocardiogram results pending 4. Carotid dopplers 5. Prophylactic therapy-None.  Agree with discontinuation of subq heparin 6. Telemetry monitoring 7. Frequent neuro checks 8. Agree with continued BP control 9. Head CT in AM without contrast    Alexis Goodell, MD Neurology [phone] 04/17/2016, 1:32 PM

## 2016-04-17 NOTE — Progress Notes (Signed)
Physical Therapy Treatment Patient Details Name: Anita Contreras MRN: [id number] DOB: May 01, 1956 Today's Date: 04/17/2016    History of Present Illness Anita Contreras  is a 60 y.o. female with a known history of uncontrolled HTN, DM, osteoporosis, diabetic neuropathy, retinopathy presents to the hospital secondary to worsening right lower quadrant abdominal pain and today history of vaginal bleeding. Patient is Spanish-speaking, so used an Astronomer. She says her light right lower quadrant abdominal pain started about 3 months ago. She has been using over-the-counter Tylenol to for pain relief and continuing all her daily activities. She says pain is constant with no radiation up until recently. Now the pain in the right lower quadrant is radiating along her anterior thigh all the way up to the foot and gives her a cramping sensation. 2 days ago she had dark maroon colored discharge from her vaginal it stopped spontaneously after 2 days. Denies any clots. Patient is postmenopausal. Never had prior vaginal bleeding. Denies any fevers, but has chills, nausea and vomiting. Also complains of loose stools for several days now. Blood pressure is elevated 230/128 in the emergency room. Prior ER visit and PCP visit indicated that she does have uncontrolled hypertension. Labs indicate worsening renal function. Last creatinine from October 2016 was 1.8, now it's up to 3.6. CT of the abdomen without contrast does not show any acute abnormalities. Pt reports 2 falls in the last 12 months    PT Comments    Pt ambulated around RN station with PT and interpreter pushing IV pole. She demonstrates some L and R staggering but able to self correct with rolling walker. Significantly improved from yesterday. Once she arrives at the room pt starts to lean heavily to the L. Her legs eventually give out and pt stops following commands. Therapist has to provide dependent support for patient with her resting  on knee. Rapid response called and pt placed in chair. Pt with severe R horizontal nystagmus during episode. Her eyes then roll back in her head and she sticks her tongue out while gurgling and biting down. Pt will not respond to cues. RN team arrives and vitals obtained. HR is in the lower 50s but BP and SaO2 within acceptable limit. RN paged MD to notify. Pt eventually starts to return to her normal mentation and then starts to vomit in emesis bag. Pt assisted back to bed. RN takes over and interpreter still present. PT session terminated  Follow Up Recommendations  SNF;Other (comment) (Pt continues to refuse)     Equipment Recommendations  Rolling walker with 5" wheels    Recommendations for Other Services       Precautions / Restrictions Precautions Precautions: Fall Restrictions Weight Bearing Restrictions: No    Mobility  Bed Mobility Overal bed mobility: Independent             General bed mobility comments: Good speed/sequencing  Transfers Overall transfer level: Needs assistance Equipment used: Rolling walker (2 wheeled) Transfers: Sit to/from Stand Sit to Stand: Min guard         General transfer comment: Pt demonstrates improved stability with transfers on this date  Ambulation/Gait Ambulation/Gait assistance: Max assist Ambulation Distance (Feet): 150 Feet Assistive device: Rolling walker (2 wheeled) Gait Pattern/deviations: Decreased step length - right;Decreased step length - left;Staggering left Gait velocity: Decreased Gait velocity interpretation: at or above normal speed for age/gender General Gait Details: Pt ambulated around RN station with PT and interpreter pushing IV pole. She demonstrates some L and R staggering  but able to self correct with rolling walker. Once she arrives at the room pt starts to lean heavily to the L. Her legs eventually give out and pt stops following commands. Therapist has to provide dependent support for patient with her  resting on knee. Rapid response called and pt place in chair. Pt with severe R horizontal nystagmus during episode. Her eyes then roll back in her head and she sticks her tongue out while gurgling and biting down. Pt will not respond to cues. RN team arrives and vitals obtained. HR is in the lower 50s but BP within acceptable limit. RN paged MD to notify. Pt eventually starts to return to her normal mentation and then starts to vomit in emesis bag. Pt assisted back to bed and has returned to her normal mentation. RN takes over and left with interpreter. PT session terminated   Stairs            Wheelchair Mobility    Modified Rankin (Stroke Patients Only)       Balance Overall balance assessment: Needs assistance Sitting-balance support: No upper extremity supported Sitting balance-Leahy Scale: Fair       Standing balance-Leahy Scale: Poor                      Cognition Arousal/Alertness: Awake/alert Behavior During Therapy: WFL for tasks assessed/performed Overall Cognitive Status: Difficult to assess (Unclear baseline and education level)                      Exercises      General Comments        Pertinent Vitals/Pain Pain Assessment: No/denies pain    Home Living                      Prior Function            PT Goals (current goals can now be found in the care plan section) Acute Rehab PT Goals Patient Stated Goal: Decrease dizziness PT Goal Formulation: With patient Time For Goal Achievement: 04/30/16 Potential to Achieve Goals: Fair Progress towards PT goals: Progressing toward goals    Frequency  7X/week    PT Plan Frequency needs to be updated    Co-evaluation             End of Session Equipment Utilized During Treatment: Gait belt Activity Tolerance: Other (comment);Treatment limited secondary to medical complications (Comment) (Limited by dizziness) Patient left: in bed;with call bell/phone within reach;with  bed alarm set;with nursing/sitter in room     Time: [id number] PT Time Calculation (min) (ACUTE ONLY): 15 min  Charges:  $Gait Training: 8-22 mins                    G Codes:      Lyndel Safe Huprich PT, DPT   Huprich,Jason 04/17/2016, 5:11 PM

## 2016-04-17 NOTE — Progress Notes (Signed)
Inpatient Diabetes Program Recommendations  AACE/ADA: New Consensus Statement on Inpatient Glycemic Control (2015)  Target Ranges:  Prepandial:   less than 140 mg/dL      Peak postprandial:   less than 180 mg/dL (1-2 hours)      Critically ill patients:  140 - 180 mg/dL  Results for BRITTAIN, ANSTEY (MRN [id number]) as of 04/17/2016 10:46  Ref. Range 04/16/2016 07:35 04/16/2016 11:44 04/16/2016 16:46 04/16/2016 21:09 04/17/2016 07:55  Glucose-Capillary Latest Ref Range: 65-99 mg/dL 126 (H) 202 (H) 241 (H) 155 (H) 133 (H)   Review of Glycemic Control  Current orders for Inpatient glycemic control: Novolog 0-9 units TID with meals, Novolog 0-5 units QHS  Inpatient Diabetes Program Recommendations: Correction (SSI): Please consider increasing Novolog correction to moderate scale.  Thanks, Barnie Alderman, RN, MSN, CDE Diabetes Coordinator Inpatient Diabetes Program [phone] (Team Pager from [place] to [place]) [phone] ([place] office) [phone] [facility] office) [phone] [facility] office)

## 2016-04-17 NOTE — Clinical Social Work Note (Signed)
CSW not consulted but PT recommending STR. RN CM spoke with patient who has declined STR and will return home at discharge.  Shela Leff MSW,LCSW [phone]

## 2016-04-17 NOTE — Progress Notes (Signed)
Dr. Posey Pronto notified of MRI results. Neuro consult to be performed.

## 2016-04-17 NOTE — Progress Notes (Signed)
*  PRELIMINARY RESULTS* Echocardiogram 2D Echocardiogram has been performed.  Anita Contreras 04/17/2016, 10:38 AM

## 2016-04-17 NOTE — Progress Notes (Signed)
[name] with physical therapy ambulating patient. [name] called out to desk calling for help and lowered patient to chair. Patient unresponsive, nystagmus present, and tongue sticking out and slumped over. BP 137/94, 94% O2 on room air, heart rate 51. Patient awakened momentarily and began to vomit. Patient stated that "this happenes often at home and she will feel better after it passes". Dr. Posey Pronto notified.  No new orders at this time. Continue to monitor.

## 2016-04-18 ENCOUNTER — Inpatient Hospital Stay: Payer: MEDICAID

## 2016-04-18 LAB — LIPID PANEL
CHOLESTEROL: 124 mg/dL (ref 0–200)
HDL: 41 mg/dL (ref >40)
LDL CALC: 51 mg/dL (ref 0–99)
TRIGLYCERIDES: 162 mg/dL — AB (ref <150)
Total CHOL/HDL Ratio: 3 RATIO
VLDL: 32 mg/dL (ref 0–40)

## 2016-04-18 LAB — GLUCOSE, CAPILLARY
GLUCOSE-CAPILLARY: 178 mg/dL — AB (ref 65–99)
Glucose-Capillary: 127 mg/dL — ABNORMAL HIGH (ref 65–99)
Glucose-Capillary: 145 mg/dL — ABNORMAL HIGH (ref 65–99)
Glucose-Capillary: 120 mg/dL — ABNORMAL HIGH (ref 65–99)
Glucose-Capillary: 210 mg/dL — ABNORMAL HIGH (ref 65–99)

## 2016-04-18 LAB — RENAL FUNCTION PANEL
ANION GAP: 6 (ref 5–15)
Albumin: 2.7 g/dL — ABNORMAL LOW (ref 3.5–5.0)
BUN: 62 mg/dL — ABNORMAL HIGH (ref 6–20)
CALCIUM: 7.8 mg/dL — AB (ref 8.9–10.3)
CHLORIDE: 108 mmol/L (ref 101–111)
CO2: 18 mmol/L — AB (ref 22–32)
Creatinine, Ser: 3.63 mg/dL — ABNORMAL HIGH (ref 0.44–1.00)
GFR, EST AFRICAN AMERICAN: 15 mL/min — AB (ref >60)
GFR, EST NON AFRICAN AMERICAN: 13 mL/min — AB (ref >60)
Glucose, Bld: 108 mg/dL — ABNORMAL HIGH (ref 65–99)
Phosphorus: 5.5 mg/dL — ABNORMAL HIGH (ref 2.5–4.6)
Potassium: 5.6 mmol/L — ABNORMAL HIGH (ref 3.5–5.1)
Sodium: 132 mmol/L — ABNORMAL LOW (ref 135–145)

## 2016-04-18 LAB — OCCULT BLOOD X 1 CARD TO LAB, STOOL: Fecal Occult Bld: NEGATIVE

## 2016-04-18 LAB — ECHOCARDIOGRAM COMPLETE
Height: 53 in
WEIGHTICAEL: 1665.6 [oz_av]

## 2016-04-18 LAB — POTASSIUM: POTASSIUM: 4.9 mmol/L (ref 3.5–5.1)

## 2016-04-18 MED ORDER — INSULIN ASPART 100 UNIT/ML IV SOLN
10.0000 [IU] | Freq: Once | INTRAVENOUS | Status: AC
  Administered 2016-04-18: 10 [IU] via INTRAVENOUS
  Filled 2016-04-18: qty 0.1

## 2016-04-18 MED ORDER — DEXTROSE 50 % IV SOLN
25.0000 mL | Freq: Once | INTRAVENOUS | Status: AC
  Administered 2016-04-18: 25 mL via INTRAVENOUS
  Filled 2016-04-18: qty 50

## 2016-04-18 MED ORDER — INSULIN ASPART 100 UNIT/ML IV SOLN
15.0000 [IU] | Freq: Once | INTRAVENOUS | Status: DC
  Filled 2016-04-18: qty 0.15

## 2016-04-18 MED ORDER — HYDRALAZINE HCL 25 MG PO TABS
25.0000 mg | ORAL_TABLET | Freq: Three times a day (TID) | ORAL | Status: DC
  Administered 2016-04-18 – 2016-04-19 (×3): 25 mg via ORAL
  Filled 2016-04-18 (×3): qty 1

## 2016-04-18 MED ORDER — SODIUM BICARBONATE 650 MG PO TABS
1300.0000 mg | ORAL_TABLET | Freq: Two times a day (BID) | ORAL | Status: DC
  Administered 2016-04-18 – 2016-04-19 (×3): 1300 mg via ORAL
  Filled 2016-04-18 (×4): qty 2

## 2016-04-18 MED ORDER — SODIUM POLYSTYRENE SULFONATE 15 GM/60ML PO SUSP
30.0000 g | Freq: Once | ORAL | Status: AC
  Administered 2016-04-18: 30 g via ORAL
  Filled 2016-04-18: qty 120

## 2016-04-18 MED ORDER — INSULIN ASPART 100 UNIT/ML IV SOLN: 20.0000 [IU] | Freq: Once | INTRAVENOUS | Status: DC

## 2016-04-18 NOTE — Progress Notes (Signed)
Physical Therapy Treatment Patient Details Name: Anita Contreras MRN: [id number] DOB: [birth year] Today's Date: 04/18/2016    History of Present Illness Anita Contreras  is a 60 y.o. female with a known history of uncontrolled HTN, DM, osteoporosis, diabetic neuropathy, retinopathy presents to the hospital secondary to worsening right lower quadrant abdominal pain and today history of vaginal bleeding. Patient is Spanish-speaking, so used an Astronomer. She says her light right lower quadrant abdominal pain started about 3 months ago. She has been using over-the-counter Tylenol to for pain relief and continuing all her daily activities. She says pain is constant with no radiation up until recently. Now the pain in the right lower quadrant is radiating along her anterior thigh all the way up to the foot and gives her a cramping sensation. 2 days ago she had dark maroon colored discharge from her vaginal it stopped spontaneously after 2 days. Denies any clots. Patient is postmenopausal. Never had prior vaginal bleeding. Denies any fevers, but has chills, nausea and vomiting. Also complains of loose stools for several days now. Blood pressure is elevated 230/128 in the emergency room. Prior ER visit and PCP visit indicated that she does have uncontrolled hypertension. Labs indicate worsening renal function. Last creatinine from October 2016 was 1.8, now it's up to 3.6. CT of the abdomen without contrast does not show any acute abnormalities. Pt reports 2 falls in the last 12 months    PT Comments    Interpreter ID number [phone] used throughout session. Pt has no voiced complaints. Participates well with supine bed exercises. Demonstrates bed mobility and transfers without physical assist, but cues for hand placement with stand. Pt initially ambulates without difficulty and only mild drift right/left; however, after 75 feet, pt has mild syncopal episode without losing consciousness requiring  rapid response. Pt recovered after several minutes and was able to return short distance to room. Session concluded and patient left in nurses care.   Follow Up Recommendations  SNF;Other (comment)     Equipment Recommendations  Rolling walker with 5" wheels    Recommendations for Other Services       Precautions / Restrictions Precautions Precautions: Fall Restrictions Weight Bearing Restrictions: No    Mobility  Bed Mobility Overal bed mobility: Independent             General bed mobility comments: sits to edge independently; crawls in on hands/knees with return to bed without difficulty. Denies dizziness in sit  Transfers Overall transfer level: Needs assistance Equipment used: Rolling walker (2 wheeled) Transfers: Sit to/from Stand Sit to Stand: Supervision         General transfer comment: Poor use of hands; stand with hands on rw. Denies dizziness with stand  Ambulation/Gait Ambulation/Gait assistance: Min guard;Max assist Ambulation Distance (Feet): 75 Feet Assistive device: Rolling walker (2 wheeled) Gait Pattern/deviations: Step-through pattern;Drifts right/left (Rw too far fwd;corrected via interpreter )   Gait velocity interpretation: at or above normal speed for age/gender General Gait Details: Pt ambulates intially with rw too far forward; corrected via interpreter well. Pt ambulates with good speed and fluidity with miild drifting right to left, but no overt LOB. At aprproximately 75 ft just before reaching room, pt put her hand to her forehead and slowly leans L onto therapist. Rapid response called to nearby nurse in hallway. Pt requires Mod support until chair brought to pt, but never loses consciousness or displays any signs or symptoms. Pt responds to cues to sit and reports she is dizzy.  While awaiting machine to take vitals, pt reports to grandson "its done" and attempts to get up. Via interpreter pt asked to remain seated until vitals taken. Vitals  taken several minutes after syncopal episode began as follows: 93% O2 saturation, HR 58 bpm, BP 166/60. Pt able to ambulate back to bed approximately 25 feet and crawl into bed unassisted. Treatment concluded and pt left in nursing care.     Stairs            Wheelchair Mobility    Modified Rankin (Stroke Patients Only)       Balance   Sitting-balance support: No upper extremity supported;Feet unsupported Sitting balance-Leahy Scale: Good     Standing balance support: Bilateral upper extremity supported Standing balance-Leahy Scale: Poor (initially fair; poor due to syncope episode)                      Cognition Arousal/Alertness: Awake/alert Behavior During Therapy: WFL for tasks assessed/performed Overall Cognitive Status: Difficult to assess                      Exercises General Exercises - Lower Extremity Ankle Circles/Pumps: AROM;Both;15 reps;Supine Quad Sets: Strengthening;Both;15 reps;Supine Heel Slides: AROM;Both;10 reps;Supine Hip ABduction/ADduction: AROM;10 reps;Supine;Both Straight Leg Raises: AROM;Both;10 reps;Supine    General Comments        Pertinent Vitals/Pain Pain Assessment: No/denies pain    Home Living                      Prior Function            PT Goals (current goals can now be found in the care plan section) Progress towards PT goals: PT to reassess next treatment    Frequency  7X/week    PT Plan Current plan remains appropriate    Co-evaluation             End of Session Equipment Utilized During Treatment: Gait belt Activity Tolerance: Other (comment) (syncopal episode) Patient left: in bed;with nursing/sitter in room;with family/visitor present     Time: 1135-1209 PT Time Calculation (min) (ACUTE ONLY): 34 min  Charges:  $Gait Training: 8-22 mins $Therapeutic Exercise: 8-22 mins                    G Codes:      Anita Contreras, PTA 04/18/2016, 1:27 PM

## 2016-04-18 NOTE — Progress Notes (Signed)
Subjective:   Conversation through [place] interpreter Patient originally came in for right lower quadrant abdominal pain.  That has resolved.  Overall patient feels better. Labs from this morning show potassium of 5.6, creatinine of 3.63/GFR of 13 Patient denies any shortness of breath No lower extremity edema  Objective:  Vital signs in last 24 hours:  Temp:  [94.1 F (34.5 C)-98 F (36.7 C)] 97.6 F (36.4 C) (05/11 0548) Pulse Rate:  [57-76] 58 (05/11 1155) Resp:  [17-19] 17 (05/11 0548) BP: (145-166)/(60-65) 166/60 mmHg (05/11 1155) SpO2:  [92 %-95 %] 93 % (05/11 1155)  Weight change:  Filed Weights   04/15/16 1017 04/16/16 1800  Weight: 47.628 kg (105 lb) 47.219 kg (104 lb 1.6 oz)    Intake/Output:    Intake/Output Summary (Last 24 hours) at 04/18/16 1224 Last data filed at 04/18/16 1101  Gross per 24 hour  Intake 1236.3 ml  Output   1300 ml  Net  -63.7 ml     Physical Exam: General: Laying in the bed, no acute distress  HEENT Anicteric, moist oral mucous membranes  Neck supple  Pulm/lungs Normal effort, room air, clear to auscultation  CVS/Heart No rub or gallop  Abdomen:  Soft, nontender, nondistended  Extremities: Trace peripheral edema  Neurologic: Alert, oriented  Skin: No acute rashes          Basic Metabolic Panel:   Recent Labs Lab 04/15/16 1025 04/16/16 0727 04/17/16 0336 04/18/16 0339  NA 137 141 138 132*  K 4.3 4.5 5.0 5.6*  CL 105 113* 111 108  CO2 '24 22 22 ' 18*  GLUCOSE 234* 142* 138* 108*  BUN 48* 44* 50* 62*  CREATININE 3.65* 3.34* 3.29* 3.63*  CALCIUM 8.2* 7.6* 7.5* 7.8*  PHOS  --   --  5.4* 5.5*     CBC:  Recent Labs Lab 04/15/16 1025 04/16/16 0727 04/17/16 0336  WBC 6.3 5.2 5.7  HGB 10.3* 8.1* 7.6*  HCT 30.8* 24.6* 22.7*  MCV 82.9 83.1 83.6  PLT 214 177 162      Microbiology:  No results found for this or any previous visit (from the past 720 hour(s)).  Coagulation Studies: No results for input(s):  LABPROT, INR in the last 72 hours.  Urinalysis: No results for input(s): COLORURINE, LABSPEC, PHURINE, GLUCOSEU, HGBUR, BILIRUBINUR, KETONESUR, PROTEINUR, UROBILINOGEN, NITRITE, LEUKOCYTESUR in the last 72 hours.  Invalid input(s): APPERANCEUR    Imaging: Ct Head Wo Contrast  04/18/2016  CLINICAL DATA:  59 year old female with recent vertigo. Found have mild cerebellar edema associated with a punctate age indeterminate cerebellar hemorrhage by MRI. Initial encounter. EXAM: CT HEAD WITHOUT CONTRAST TECHNIQUE: Contiguous axial images were obtained from the base of the skull through the vertex without intravenous contrast. COMPARISON:  Brain MRI 04/17/2016. FINDINGS: Visualized paranasal sinuses and mastoids are clear. No acute osseous abnormality identified. Visualized orbits and scalp soft tissues are within normal limits. Cerebral volume is within normal limits for age. No intracranial mass effect. The area of abnormality in the inferior right cerebellum is evident as mild hypodensity on CT (series 2, image 7) without associated visible blood products by CT. No associated mass effect. Cerebellar and brainstem gray-white matter differentiation otherwise is normal. Normal supratentorial gray-white matter differentiation. No new cortically based infarct. No hyperdense intracranial hemorrhage identified. IMPRESSION: Recently seen right cerebellar abnormality is visible on CT as a small area of mild hypodensity. No macroscopic hemorrhage or other acute intracranial abnormality evident by CT. Electronically Signed   By: Lemmie Evens  [place] Crane M.D.   On: 04/18/2016 11:32   [name]  04/17/2016  CLINICAL DATA:  Vertigo.  Hypertensive urgency and renal failure. EXAM: MRI HEAD WITHOUT CONTRAST TECHNIQUE: Multiplanar, multiecho pulse sequences of the brain and surrounding structures were obtained without intravenous contrast. COMPARISON:  None. FINDINGS: There is mild motion artifact throughout. There is no evidence  of acute infarct, midline shift, or extra-axial fluid collection. Slight diffuse sulcal prominence is compatible with mild cerebral atrophy. Ventricles are normal in size. No significant cerebral white matter disease is seen. There is a 3 mm focus of susceptibility artifact in the inferior right cerebellum with mild surrounding T2 hyperintensity/edema. There is no evidence of intracranial hemorrhage elsewhere. Orbits are unremarkable. There is mild left maxillary sinus mucosal thickening. Mastoid air cells are clear. Major intracranial vascular flow voids are preserved. IMPRESSION: 1. 3 mm focus of blood products in the right cerebellum. This is of uncertain age by MRI, however a small acute/subacute hemorrhage is a concern given evidence of mild surrounding edema and patient's current vertigo. This may be hypertensive in etiology or possibly due to an underlying cavernoma. Noncontrast head CT is recommended. 2. No other evidence of acute intracranial abnormality. These results will be called to the ordering clinician or representative by the Radiologist Assistant, and communication documented in the PACS or zVision Dashboard. Electronically Signed   By: Logan Bores M.D.   On: 04/17/2016 12:12   US Carotid Bilateral  04/17/2016  CLINICAL DATA:  Intracranial hemorrhage EXAM: BILATERAL CAROTID DUPLEX ULTRASOUND TECHNIQUE: Pearline Cables scale imaging, color Doppler and duplex ultrasound was performed of bilateral carotid and vertebral arteries in the neck. COMPARISON:  None. REVIEW OF SYSTEMS: Quantification of carotid stenosis is based on velocity parameters that correlate the residual internal carotid diameter with NASCET-based stenosis levels, using the diameter of the distal internal carotid lumen as the denominator for stenosis measurement. The following velocity measurements were obtained: PEAK SYSTOLIC/END DIASTOLIC RIGHT ICA:                     128/45cm/sec CCA:                     81/15BW/IOM SYSTOLIC ICA/CCA  RATIO:  3.55 DIASTOLIC ICA/CCA RATIO: 9.74 ECA:                     166cm/sec LEFT ICA:                     165/46cm/sec CCA:                     16/38GT/XMI SYSTOLIC ICA/CCA RATIO:  6.80 DIASTOLIC ICA/CCA RATIO: 3.21 ECA:                     177cm/sec FINDINGS: RIGHT CAROTID ARTERY: Mild eccentric partially calcified plaque in the bulb and ICA origin. No high-grade stenosis. Normal waveforms and color Doppler signal. RIGHT VERTEBRAL ARTERY:  Normal flow direction and waveform. LEFT CAROTID ARTERY: Calcified plaque in the carotid bulb extending to involve proximal internal and external carotid arteries. No high-grade ICA stenosis. Normal waveforms and color Doppler signal. LEFT VERTEBRAL ARTERY: Normal flow direction and waveform. IMPRESSION: 1. Mild bilateral carotid bifurcation and proximal ICA plaque, resulting in less than 50% diameter stenosis. The exam does not exclude plaque ulceration or embolization. Continued surveillance recommended. 2.  Antegrade bilateral vertebral arterial flow. Electronically Signed   By: Eden Emms.D.  On: 04/17/2016 15:08     Medications:     . amLODipine  10 mg Oral Daily  . atorvastatin  40 mg Oral Daily  . calcium acetate  667 mg Oral TID WC  . docusate sodium  100 mg Oral BID  . hydrALAZINE  25 mg Oral TID  . insulin aspart  0-5 Units Subcutaneous QHS  . insulin aspart  0-9 Units Subcutaneous TID WC  . metoprolol tartrate  25 mg Oral BID  . sodium bicarbonate  1,300 mg Oral BID   acetaminophen **OR** acetaminophen, hydrALAZINE, meclizine, ondansetron **OR** ondansetron (ZOFRAN) IV, polyethylene glycol  Assessment/ Plan:  60 y.o. Hispanic female with insulin dependent diabetes mellitus type II, diabetic retinopathy, hypertension, hyperlipidemia, hemorrhagic cystitis, migraine headaches, osteoarthritis, osteoporosis who was admitted to [facility] on 04/15/2016   1. Acute Renal Failure on chronic kidney disease stage III with proteinuria: baseline creatinine of  1.79, eGFR of 30 from 09/20/2015. However there is concern of progression of disease.  - CKD is most likely due to poorly controlled DM and HTN in context of other complications of DM (neuropathy, Nephropathy) - serologies neg - had extensive discussion today and presence of patient's 2 daughters about her renal disease and prognosis.  In my opinion, she will end up on dialysis before the end of the year.  She has been taking her diabetes and blood pressure medications off and on because of financial issues.  Patient up or she does not have health insurance. - Family had a lot of questions about possibility of renal transplant.  We discussed the process briefly.  Patient's 1st priority is to get her blood pressure and diabetes under better control  2. Hypertension: primarily isolated systolic suggesting severe atherosclerosis - variable control - Home regimen of lisinopril.  - currently on amlodipine and metoprolol - Consider adding hydralazine  3. Diabetes Mellitus type II with chronic kidney disease: insulin dependent. Hemoglobin A1c 7.2% - continue glucose control.   4. Hyperkalemia and acidosis - start sodium bicarbonate - agree with shifting measures   LOS: 3 Lacole Komorowski 5/11/201712:24 PM

## 2016-04-18 NOTE — Progress Notes (Signed)
Patient ID: Anita Contreras, female   DOB: 1956/07/13, 60 y.o.   MRN: [id number] [facility] NAME: Anita Contreras    MR#:  [id number]  DATE OF BIRTH:  1956-04-18  SUBJECTIVE:  Via interpreter. Symptoms of dizziness/vertigo improving No more abdominal pain No vaginal bleeding Family in the room REVIEW OF SYSTEMS:   Review of Systems  Constitutional: Negative for fever, chills and weight loss.  HENT: Negative for ear discharge, ear pain and nosebleeds.   Eyes: Negative for blurred vision, pain and discharge.  Respiratory: Negative for sputum production, shortness of breath, wheezing and stridor.   Cardiovascular: Negative for chest pain, palpitations, orthopnea and PND.  Gastrointestinal: Negative for nausea, vomiting, abdominal pain and diarrhea.  Genitourinary: Negative for urgency and frequency.  Musculoskeletal: Negative for back pain and joint pain.  Neurological: Negative for sensory change, speech change, focal weakness and weakness.  Psychiatric/Behavioral: Negative for depression and hallucinations. The patient is not nervous/anxious.    Tolerating Diet:yes Tolerating PT: rec rehab  DRUG ALLERGIES:  No Known Allergies  VITALS:  Blood pressure 166/60, pulse 58, temperature 97.6 F (36.4 C), temperature source Oral, resp. rate 17, height 4\' 5"  (1.346 m), weight 47.219 kg (104 lb 1.6 oz), last menstrual period 09/19/2001, SpO2 93 %.  PHYSICAL EXAMINATION:   Physical Exam  GENERAL:  60 y.o.-year-old patient lying in the bed with no acute distress.  EYES: Pupils equal, round, reactive to light and accommodation. No scleral icterus. Extraocular muscles intact. Nystagmus++ HEENT: Head atraumatic, normocephalic. Oropharynx and nasopharynx clear.  NECK:  Supple, no jugular venous distention. No thyroid enlargement, no tenderness.  LUNGS: Normal breath sounds bilaterally, no wheezing, rales, rhonchi.  No use of accessory muscles of respiration.  CARDIOVASCULAR: S1, S2 normal. No murmurs, rubs, or gallops.  ABDOMEN: Soft, nontender, nondistended. Bowel sounds present. No organomegaly or mass.  EXTREMITIES: No cyanosis, clubbing or edema b/l.    NEUROLOGIC: Cranial nerves II through XII are intact. No focal Motor or sensory deficits b/l.   PSYCHIATRIC:  patient is alert and oriented x 3.  SKIN: No obvious rash, lesion, or ulcer.   LABORATORY PANEL:  CBC  Recent Labs Lab 04/17/16 0336  WBC 5.7  HGB 7.6*  HCT 22.7*  PLT 162    Chemistries   Recent Labs Lab 04/15/16 1025  04/18/16 0339  NA 137  < > 132*  K 4.3  < > 5.6*  CL 105  < > 108  CO2 24  < > 18*  GLUCOSE 234*  < > 108*  BUN 48*  < > 62*  CREATININE 3.65*  < > 3.63*  CALCIUM 8.2*  < > 7.8*  AST 19  --   --   ALT 13*  --   --   ALKPHOS 131*  --   --   BILITOT 0.8  --   --   < > = values in this interval not displayed. Cardiac Enzymes  Recent Labs Lab 04/16/16 0727  TROPONINI <0.03   RADIOLOGY:  Ct Head Wo Contrast  04/18/2016  CLINICAL DATA:  60 year old female with recent vertigo. Found have mild cerebellar edema associated with a punctate age indeterminate cerebellar hemorrhage by MRI. Initial encounter. EXAM: CT HEAD WITHOUT CONTRAST TECHNIQUE: Contiguous axial images were obtained from the base of the skull through the vertex without intravenous contrast. COMPARISON:  Brain MRI 04/17/2016. FINDINGS: Visualized paranasal sinuses and mastoids are clear. No acute osseous abnormality identified.  Visualized orbits and scalp soft tissues are within normal limits. Cerebral volume is within normal limits for age. No intracranial mass effect. The area of abnormality in the inferior right cerebellum is evident as mild hypodensity on CT (series 2, image 7) without associated visible blood products by CT. No associated mass effect. Cerebellar and brainstem gray-white matter differentiation otherwise is normal. Normal  supratentorial gray-white matter differentiation. No new cortically based infarct. No hyperdense intracranial hemorrhage identified. IMPRESSION: Recently seen right cerebellar abnormality is visible on CT as a small area of mild hypodensity. No macroscopic hemorrhage or other acute intracranial abnormality evident by CT. Electronically Signed   By: Genevie Ann M.D.   On: 04/18/2016 11:32   [name]  04/17/2016  CLINICAL DATA:  Vertigo.  Hypertensive urgency and renal failure. EXAM: MRI HEAD WITHOUT CONTRAST TECHNIQUE: Multiplanar, multiecho pulse sequences of the brain and surrounding structures were obtained without intravenous contrast. COMPARISON:  None. FINDINGS: There is mild motion artifact throughout. There is no evidence of acute infarct, midline shift, or extra-axial fluid collection. Slight diffuse sulcal prominence is compatible with mild cerebral atrophy. Ventricles are normal in size. No significant cerebral white matter disease is seen. There is a 3 mm focus of susceptibility artifact in the inferior right cerebellum with mild surrounding T2 hyperintensity/edema. There is no evidence of intracranial hemorrhage elsewhere. Orbits are unremarkable. There is mild left maxillary sinus mucosal thickening. Mastoid air cells are clear. Major intracranial vascular flow voids are preserved. IMPRESSION: 1. 3 mm focus of blood products in the right cerebellum. This is of uncertain age by MRI, however a small acute/subacute hemorrhage is a concern given evidence of mild surrounding edema and patient's current vertigo. This may be hypertensive in etiology or possibly due to an underlying cavernoma. Noncontrast head CT is recommended. 2. No other evidence of acute intracranial abnormality. These results will be called to the ordering clinician or representative by the Radiologist Assistant, and communication documented in the PACS or zVision Dashboard. Electronically Signed   By: Logan Bores M.D.   On:  04/17/2016 12:12   US Carotid Bilateral  04/17/2016  CLINICAL DATA:  Intracranial hemorrhage EXAM: BILATERAL CAROTID DUPLEX ULTRASOUND TECHNIQUE: Pearline Cables scale imaging, color Doppler and duplex ultrasound was performed of bilateral carotid and vertebral arteries in the neck. COMPARISON:  None. REVIEW OF SYSTEMS: Quantification of carotid stenosis is based on velocity parameters that correlate the residual internal carotid diameter with NASCET-based stenosis levels, using the diameter of the distal internal carotid lumen as the denominator for stenosis measurement. The following velocity measurements were obtained: PEAK SYSTOLIC/END DIASTOLIC RIGHT ICA:                     128/45cm/sec CCA:                     [id number] SYSTOLIC ICA/CCA RATIO:  [id number] DIASTOLIC ICA/CCA RATIO: [id number] ECA:                     166cm/sec LEFT ICA:                     165/46cm/sec CCA:                     [id number] SYSTOLIC ICA/CCA RATIO:  [id number] DIASTOLIC ICA/CCA RATIO: [id number] ECA:                     177cm/sec FINDINGS: RIGHT  CAROTID ARTERY: Mild eccentric partially calcified plaque in the bulb and ICA origin. No high-grade stenosis. Normal waveforms and color Doppler signal. RIGHT VERTEBRAL ARTERY:  Normal flow direction and waveform. LEFT CAROTID ARTERY: Calcified plaque in the carotid bulb extending to involve proximal internal and external carotid arteries. No high-grade ICA stenosis. Normal waveforms and color Doppler signal. LEFT VERTEBRAL ARTERY: Normal flow direction and waveform. IMPRESSION: 1. Mild bilateral carotid bifurcation and proximal ICA plaque, resulting in less than 50% diameter stenosis. The exam does not exclude plaque ulceration or embolization. Continued surveillance recommended. 2.  Antegrade bilateral vertebral arterial flow. Electronically Signed   By: Lucrezia Europe M.D.   On: 04/17/2016 15:08   ASSESSMENT AND PLAN:   Analeyah Aispuro is a 60 y.o. female with a known history of uncontrolled HTN, DM,  osteoporosis, diabetic neuropathy, retinopathy presents to the hospital secondary to worsening right lower quadrant abdominal pain and today history of vaginal bleeding.  #1 hypertensive urgency- history of noncompliance with medications. Chronically elevated blood pressure. -Due to renal failure, hold lisinopril. -on BB and amlodipine. Also IV hydralazine when necessary  #2 acute renal failure on CKD-also progressively worsening CKD due to uncontrolled hypertension and nephrotic range proteinuria.  -CT of the abdomen with no obstruction or hydronephrosis noted. -Gentle hydration. Avoid nephrotoxins.  Hold lisinopril. -Dr.kolluru spoke with patient regarding renal biopsy and possibly dialysis down the road given no improvement in GFR and nephrotic range proteinuria -hyperkalemia-insulin and dextrose and po kayexalate  #3 acute vertigo with dizziness due to right cerebellar hemorrhage (MRI brain) -CT head shows stable right cerebellar hypodensity Overall neurologically intact. -avoid NSAIDS -seen by Neurology  #4 diabetes mellitus-check A1c. Patient not sure of what she takes at home for her diabetes. - On 70/30 insulin. - on sliding scale insulin here and verify home medications  #5 vaginal bleeding-postmenopausal vaginal bleeding. Spontaneously resolved. Hemoglobin is stable. -Transvaginal ultrasound negative -no acute issues, can be followed up as an outpatient  #6 neuropathy-continue gabapentin  #7 DVT prophylaxis-started on subcutaneous heparin  Will d/c home with outpt f/u with [facility]  Case discussed with Care Management/Social Worker. Management plans discussed with the patient, family and they are in agreement.  CODE STATUS: full DVT Prophylaxis: lovenox TOTAL TIME TAKING CARE OF THIS PATIENT: 40 minutes.  >50% time spent on counselling and coordination of care  POSSIBLE D/C IN 2-3 DAYS, DEPENDING ON CLINICAL CONDITION.  Note: This dictation was prepared  with Dragon dictation along with smaller phrase technology. Any transcriptional errors that result from this process are unintentional.  Jenay Morici M.D on 04/18/2016 at 1:04 PM  Between 7am to 6pm - Pager - [phone]  After 6pm go to [email or web address] - password [email or web address] [facility]  Office  [phone]  CC: Primary care physician; WHITE, Arlie Solomons, FNP

## 2016-04-18 NOTE — Progress Notes (Signed)
Patient has near fainting episode during ambulation with PT.  VS stable. Will continue to monitor.

## 2016-04-18 NOTE — Progress Notes (Signed)
Patient was assisted to the bathroom and sat in the toilet suddenly c/o of dizziness. Able to get back in the bed. When she got back to bed, she was complaining of being nauseated . So the nurse tech went and looked for primary nurse. While the nurse tech went out the room to look for the nurse, the patient became unconscious and was drooling. Rapid response was called in. Sternal rub applied and eventually the patient became conscious again. VS was checked. Her Blood pressure reading were very elevated and heart rate within normal limit. This morning she was about to pass out also while ambulating. But this afternoon was worse than this morning. MD (Prime Doc on call, Dr. Bridgett Larsson and Dr. Belenda Cruise were  notified of the incident. BP prn med given. We will continue.

## 2016-04-18 NOTE — Care Management (Signed)
Patient's daughters provided information on amount of financial support they provide monthly for the patient. This information was motorized and faxed to the [facility].  Will follow up on adjusted sliding scale co pay.  Per [name] with [facility], patient would qualify for charity home care services due to diagnosis code.  RNCM following for discharge planning.

## 2016-04-18 NOTE — Progress Notes (Signed)
   04/18/16 2100  Clinical Encounter Type  Visited With Family  Visit Type Code  Consult/Referral To Chaplain  Spiritual Encounters  Spiritual Needs Emotional  Stress Factors  Family Stress Factors Lack of knowledge  Chaplain responded to rapid response and provided emotional support to daughter. Chaplain also provided child care to patients grandchildren given children time alone with mother to sort through their emotions together.   [place] [phone]

## 2016-04-18 NOTE — Progress Notes (Signed)
Called for a rapid response.  Patient to be found lethargic- with vitals stable on room air at 96%.  Was told by RN that patient went up to walk to bathroom and back to bed but suddenly passed out.  Patient started to arouse slowly.  She stated she was dizzy and does not remember what happened.  Patient becoming more oriented.  Patient is spanish-speaking only- interpreter at bedside with patient and family.  Patient was NSR in 60's and BP rose to [id number] systolic but slowing coming down to [id number] systolic.  Patient received dose of hydralazine around 17:15- Per Dr. Margaretmary Eddy patient to receive PRN hydralazine- patient to use BSC or bed pan until pressures stablize and also to check ortho static blood pressures.  No fluids at this time though patient stated that she a a few bowel movements today.

## 2016-04-19 DIAGNOSIS — I614 Nontraumatic intracerebral hemorrhage in cerebellum: Secondary | ICD-10-CM | POA: Insufficient documentation

## 2016-04-19 LAB — GLUCOSE, CAPILLARY: GLUCOSE-CAPILLARY: 112 mg/dL — AB (ref 65–99)

## 2016-04-19 MED ORDER — HYDRALAZINE HCL 25 MG PO TABS: 25.0000 mg | ORAL_TABLET | Freq: Three times a day (TID) | ORAL | Status: DC

## 2016-04-19 MED ORDER — CALCIUM ACETATE (PHOS BINDER) 667 MG PO CAPS: 667.0000 mg | ORAL_CAPSULE | Freq: Three times a day (TID) | ORAL | Status: AC

## 2016-04-19 MED ORDER — METOPROLOL TARTRATE 25 MG PO TABS: 25.0000 mg | ORAL_TABLET | Freq: Two times a day (BID) | ORAL | Status: DC

## 2016-04-19 MED ORDER — AMLODIPINE BESYLATE 10 MG PO TABS: 10.0000 mg | ORAL_TABLET | Freq: Every day | ORAL | Status: DC

## 2016-04-19 MED ORDER — SODIUM BICARBONATE 650 MG PO TABS: 1300.0000 mg | ORAL_TABLET | Freq: Two times a day (BID) | ORAL | Status: DC

## 2016-04-19 MED ORDER — MECLIZINE HCL 25 MG PO TABS: 25.0000 mg | ORAL_TABLET | Freq: Three times a day (TID) | ORAL | Status: DC | PRN

## 2016-04-19 NOTE — Discharge Summary (Addendum)
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 Acute cystitis with hematuria   . Neuropathy ([place])   . Migraine variant   . Cataracts, bilateral   . Diabetic retinopathy, background ([place])   . Dizziness   . Vitamin D deficiency   . Personal history of noncompliance with medical treatment, presenting hazards to health   . Type II diabetes mellitus with ophthalmic manifestations, uncontrolled ([place])   . Hyperlipidemia   . OP (osteoporosis)   . Hypertension   . CKD (chronic kidney disease)     HOSPITAL COURSE:  Anita Contreras is a 60 y.o. female with a known history of uncontrolled HTN, DM, osteoporosis, diabetic neuropathy, retinopathy presents to the hospital secondary to worsening right lower quadrant abdominal pain and today history of vaginal bleeding.  #1 hypertensive urgency- history of noncompliance with medications. Chronically elevated blood pressure. -Due to renal failure, hold lisinopril. -on BB , hydralazine. -IV hydralazine when necessary  #2 acute renal failure on CKD-also progressively worsening CKD due to uncontrolled hypertension and nephrotic range proteinuria.  -CT of the abdomen with no obstruction or hydronephrosis noted. -received dration. Avoid nephrotoxins. Hold lisinopril. -Dr.kolluru spoke with patient regarding renal biopsy and possibly dialysis down the  road given no improvement in GFR and nephrotic range proteinuria. Pt will f/u with [facility] nephrology in [place]. She has seen Dr Juanito Doom before -hyperkalemia-insulin and dextrose and po kayexalate---K down to 4.9  #3 acute vertigo with dizziness due to right cerebellar hemorrhage (MRI brain) -CT head shows stable right cerebellar hypodensity Overall neurologically intact.PT to f/u at home -avoid NSAIDS -seen by Neurology  #4 diabetes mellitus. - On 70/30 insulin. - on sliding scale insulin here and verify home medications  #5 vaginal bleeding-postmenopausal vaginal bleeding. Spontaneously resolved. Hemoglobin is stable. -Transvaginal ultrasound negative -no acute issues, can be followed up as an outpatient  #6 neuropathy-continue gabapentin  #7 DVT prophylaxis scd's Overall stable d/c home CONSULTS OBTAINED:  Treatment Team:  Lavonia Dana, MD  DRUG ALLERGIES:  No Known Allergies  DISCHARGE MEDICATIONS:   Current Discharge Medication List    START taking these medications   Details  amLODipine (NORVASC) 10 MG tablet Take 1 tablet (10 mg total) by mouth daily. Qty: 30 tablet, Refills: 0    calcium acetate (PHOSLO) 667 MG capsule Take 1 capsule (667 mg total) by mouth 3 (three) times daily with meals. Qty: 90 capsule, Refills: 0    hydrALAZINE (APRESOLINE) 25 MG tablet Take 1 tablet (25 mg total) by mouth 3 (three) times daily. Qty: 90 tablet, Refills: 0    meclizine (ANTIVERT) 25 MG tablet Take 1 tablet (25 mg total) by mouth 3 (three) times daily as needed for dizziness. Qty: 30 tablet, Refills: 0    metoprolol tartrate (LOPRESSOR) 25 MG tablet Take 1 tablet (25 mg total) by mouth 2 (two) times daily. Qty: 60 tablet, Refills: 0      CONTINUE these medications which have NOT CHANGED  Details  acetaminophen (TYLENOL) 500 MG tablet Take 1,000 mg by mouth every 6 (six) hours as needed for mild pain or headache.    atorvastatin (LIPITOR) 40 MG tablet Take 40 mg by  mouth at bedtime.     insulin NPH-regular Human (NOVOLIN 70/30) (70-30) 100 UNIT/ML injection Inject 15 Units into the skin daily.       STOP taking these medications     lisinopril (PRINIVIL,ZESTRIL) 40 MG tablet         If you experience worsening of your admission symptoms, develop shortness of breath, life threatening emergency, suicidal or homicidal thoughts you must seek medical attention immediately by calling 911 or calling your MD immediately  if symptoms less severe.  You Must read complete instructions/literature along with all the possible adverse reactions/side effects for all the Medicines you take and that have been prescribed to you. Take any new Medicines after you have completely understood and accept all the possible adverse reactions/side effects.   Please note  You were cared for by a hospitalist during your hospital stay. If you have any questions about your discharge medications or the care you received while you were in the hospital after you are discharged, you can call the unit and asked to speak with the hospitalist on call if the hospitalist that took care of you is not available. Once you are discharged, your primary care physician will handle any further medical issues. Please note that NO REFILLS for any discharge medications will be authorized once you are discharged, as it is imperative that you return to your primary care physician (or establish a relationship with a primary care physician if you do not have one) for your aftercare needs so that they can reassess your need for medications and monitor your lab values. Today   SUBJECTIVE   Via interpreter. Had some facial numbness. No focal weakness  VITAL SIGNS:  Blood pressure 167/64, pulse 63, temperature 98.4 F (36.9 C), temperature source Oral, resp. rate 18, height 4\' 5"  (1.346 m), weight 47.219 kg (104 lb 1.6 oz), last menstrual period 09/19/2001, SpO2 91 %.  I/O:    Intake/Output Summary (Last  24 hours) at 04/19/16 1106 Last data filed at 04/19/16 1016  Gross per 24 hour  Intake    480 ml  Output    400 ml  Net     80 ml    PHYSICAL EXAMINATION:  GENERAL:  60 y.o.-year-old patient lying in the bed with no acute distress.  EYES: Pupils equal, round, reactive to light and accommodation. No scleral icterus. Extraocular muscles intact.  HEENT: Head atraumatic, normocephalic. Oropharynx and nasopharynx clear.  NECK:  Supple, no jugular venous distention. No thyroid enlargement, no tenderness.  LUNGS: Normal breath sounds bilaterally, no wheezing, rales,rhonchi or crepitation. No use of accessory muscles of respiration.  CARDIOVASCULAR: S1, S2 normal. No murmurs, rubs, or gallops.  ABDOMEN: Soft, non-tender, non-distended. Bowel sounds present. No organomegaly or mass.  EXTREMITIES: No pedal edema, cyanosis, or clubbing.  NEUROLOGIC: Cranial nerves II through XII are intact. Muscle strength 5/5 in all extremities. Sensation intact. Gait not checked.  PSYCHIATRIC: The patient is alert and oriented x 3.  SKIN: No obvious rash, lesion, or ulcer.   DATA REVIEW:   CBC   Recent Labs Lab 04/17/16 0336  WBC 5.7  HGB 7.6*  HCT 22.7*  PLT 162    Chemistries   Recent Labs Lab 04/15/16 1025  04/18/16 0339 04/18/16 1519  NA 137  < >  132*  --   K 4.3  < > 5.6* 4.9  CL 105  < > 108  --   CO2 24  < > 18*  --   GLUCOSE 234*  < > 108*  --   BUN 48*  < > 62*  --   CREATININE 3.65*  < > 3.63*  --   CALCIUM 8.2*  < > 7.8*  --   AST 19  --   --   --   ALT 13*  --   --   --   ALKPHOS 131*  --   --   --   BILITOT 0.8  --   --   --   < > = values in this interval not displayed.  Microbiology Results   No results found for this or any previous visit (from the past 240 hour(s)).  RADIOLOGY:  Ct Head Wo Contrast  04/18/2016  CLINICAL DATA:  60 year old female with recent vertigo. Found have mild cerebellar edema associated with a punctate age indeterminate cerebellar hemorrhage  by MRI. Initial encounter. EXAM: CT HEAD WITHOUT CONTRAST TECHNIQUE: Contiguous axial images were obtained from the base of the skull through the vertex without intravenous contrast. COMPARISON:  Brain MRI 04/17/2016. FINDINGS: Visualized paranasal sinuses and mastoids are clear. No acute osseous abnormality identified. Visualized orbits and scalp soft tissues are within normal limits. Cerebral volume is within normal limits for age. No intracranial mass effect. The area of abnormality in the inferior right cerebellum is evident as mild hypodensity on CT (series 2, image 7) without associated visible blood products by CT. No associated mass effect. Cerebellar and brainstem gray-white matter differentiation otherwise is normal. Normal supratentorial gray-white matter differentiation. No new cortically based infarct. No hyperdense intracranial hemorrhage identified. IMPRESSION: Recently seen right cerebellar abnormality is visible on CT as a small area of mild hypodensity. No macroscopic hemorrhage or other acute intracranial abnormality evident by CT. Electronically Signed   By: Genevie Ann M.D.   On: 04/18/2016 11:32   [name]  04/17/2016  CLINICAL DATA:  Vertigo.  Hypertensive urgency and renal failure. EXAM: MRI HEAD WITHOUT CONTRAST TECHNIQUE: Multiplanar, multiecho pulse sequences of the brain and surrounding structures were obtained without intravenous contrast. COMPARISON:  None. FINDINGS: There is mild motion artifact throughout. There is no evidence of acute infarct, midline shift, or extra-axial fluid collection. Slight diffuse sulcal prominence is compatible with mild cerebral atrophy. Ventricles are normal in size. No significant cerebral white matter disease is seen. There is a 3 mm focus of susceptibility artifact in the inferior right cerebellum with mild surrounding T2 hyperintensity/edema. There is no evidence of intracranial hemorrhage elsewhere. Orbits are unremarkable. There is mild  left maxillary sinus mucosal thickening. Mastoid air cells are clear. Major intracranial vascular flow voids are preserved. IMPRESSION: 1. 3 mm focus of blood products in the right cerebellum. This is of uncertain age by MRI, however a small acute/subacute hemorrhage is a concern given evidence of mild surrounding edema and patient's current vertigo. This may be hypertensive in etiology or possibly due to an underlying cavernoma. Noncontrast head CT is recommended. 2. No other evidence of acute intracranial abnormality. These results will be called to the ordering clinician or representative by the Radiologist Assistant, and communication documented in the PACS or zVision Dashboard. Electronically Signed   By: Logan Bores M.D.   On: 04/17/2016 12:12   US Carotid Bilateral  04/17/2016  CLINICAL DATA:  Intracranial hemorrhage EXAM: BILATERAL CAROTID DUPLEX  ULTRASOUND TECHNIQUE: Pearline Cables scale imaging, color Doppler and duplex ultrasound was performed of bilateral carotid and vertebral arteries in the neck. COMPARISON:  None. REVIEW OF SYSTEMS: Quantification of carotid stenosis is based on velocity parameters that correlate the residual internal carotid diameter with NASCET-based stenosis levels, using the diameter of the distal internal carotid lumen as the denominator for stenosis measurement. The following velocity measurements were obtained: PEAK SYSTOLIC/END DIASTOLIC RIGHT ICA:                     128/45cm/sec CCA:                     [id number] SYSTOLIC ICA/CCA RATIO:  [id number] DIASTOLIC ICA/CCA RATIO: [id number] ECA:                     166cm/sec LEFT ICA:                     165/46cm/sec CCA:                     [id number] SYSTOLIC ICA/CCA RATIO:  [id number] DIASTOLIC ICA/CCA RATIO: [id number] ECA:                     177cm/sec FINDINGS: RIGHT CAROTID ARTERY: Mild eccentric partially calcified plaque in the bulb and ICA origin. No high-grade stenosis. Normal waveforms and color Doppler signal. RIGHT VERTEBRAL ARTERY:  Normal  flow direction and waveform. LEFT CAROTID ARTERY: Calcified plaque in the carotid bulb extending to involve proximal internal and external carotid arteries. No high-grade ICA stenosis. Normal waveforms and color Doppler signal. LEFT VERTEBRAL ARTERY: Normal flow direction and waveform. IMPRESSION: 1. Mild bilateral carotid bifurcation and proximal ICA plaque, resulting in less than 50% diameter stenosis. The exam does not exclude plaque ulceration or embolization. Continued surveillance recommended. 2.  Antegrade bilateral vertebral arterial flow. Electronically Signed   By: Lucrezia Europe M.D.   On: 04/17/2016 15:08     Management plans discussed with the patient, family and they are in agreement.  CODE STATUS:     Code Status Orders        Start     Ordered   04/15/16 1919  Full code   Continuous     04/15/16 1918    Code Status History    Date Active Date Inactive Code Status Order ID Comments User Context   This patient has a current code status but no historical code status.      TOTAL TIME TAKING CARE OF THIS PATIENT: 40 minutes.    Macyn Remmert M.D on 04/19/2016 at 11:06 AM  Between 7am to 6pm - Pager - [phone] After 6pm go to [email or web address] - password [email or web address] [facility]  Office  [phone]  CC: Primary care physician; WHITE, Arlie Solomons, FNP

## 2016-04-19 NOTE — Progress Notes (Signed)
Subjective: Patient unchanged.    Objective: Current vital signs: BP 167/64 mmHg  Pulse 63  Temp(Src) 98.4 F (36.9 C) (Oral)  Resp 18  Ht 4\' 5"  (1.346 m)  Wt 47.219 kg (104 lb 1.6 oz)  BMI 26.06 kg/m2  SpO2 91%  LMP 09/19/2001 (Approximate) Vital signs in last 24 hours: Temp:  [98 F (36.7 C)-98.6 F (37 C)] 98.4 F (36.9 C) (05/12 0808) Pulse Rate:  [58-74] 63 (05/12 0808) Resp:  [16-18] 18 (05/12 0808) BP: (86-218)/(47-80) 167/64 mmHg (05/12 0808) SpO2:  [91 %-97 %] 91 % (05/12 0808)  Intake/Output from previous day: 05/11 0701 - 05/12 0700 In: 646.3 [P.O.:480; I.V.:166.3] Out: 900 [Urine:900] Intake/Output this shift: Total I/O In: -  Out: 300 [Urine:300] Nutritional status: Diet heart healthy/carb modified Room service appropriate?: Yes; Fluid consistency:: Thin Diet - low sodium heart healthy  Neurologic Exam: Alert, oriented, thought content appropriate. Speech fluent without evidence of aphasia. Able to follow simple commands but requires extensive reinforcement for 3-step commands even with an interpreter Cranial Nerves: II: Discs flat bilaterally; Visual fields grossly normal, pupils equal, round, reactive to light and accommodation III,IV, VI: ptosis not present, extra-ocular motions intact bilaterally V,VII: smile symmetric, facial light touch sensation normal bilaterally VIII: hearing normal bilaterally IX,X: gag reflex present XI: bilateral shoulder shrug XII: midline tongue extension Motor: Right :Upper extremity 5/5Left: Upper extremity 5/5 Lower extremity 5/5Lower extremity 5/5  Lab Results: Basic Metabolic Panel:  Recent Labs Lab 04/15/16 1025 04/16/16 0727 04/17/16 0336 04/18/16 0339 04/18/16 1519  NA 137 141 138 132*  --   K 4.3 4.5 5.0 5.6* 4.9  CL 105 113* 111 108  --   CO2 24 22 22  18*  --   GLUCOSE 234* 142* 138*  108*  --   BUN 48* 44* 50* 62*  --   CREATININE 3.65* 3.34* 3.29* 3.63*  --   CALCIUM 8.2* 7.6* 7.5* 7.8*  --   PHOS  --   --  5.4* 5.5*  --     Liver Function Tests:  Recent Labs Lab 04/15/16 1025 04/17/16 0336 04/18/16 0339  AST 19  --   --   ALT 13*  --   --   ALKPHOS 131*  --   --   BILITOT 0.8  --   --   PROT 6.4*  --   --   ALBUMIN 3.3* 2.6* 2.7*    Recent Labs Lab 04/15/16 1025  LIPASE 31   No results for input(s): AMMONIA in the last 168 hours.  CBC:  Recent Labs Lab 04/15/16 1025 04/16/16 0727 04/17/16 0336  WBC 6.3 5.2 5.7  HGB 10.3* 8.1* 7.6*  HCT 30.8* 24.6* 22.7*  MCV 82.9 83.1 83.6  PLT 214 177 162    Cardiac Enzymes:  Recent Labs Lab 04/15/16 1025 04/15/16 1928 04/16/16 0114 04/16/16 0727  TROPONINI <0.03 <0.03 0.03 <0.03    Lipid Panel:  Recent Labs Lab 04/18/16 0339  CHOL 124  TRIG 162*  HDL 41  CHOLHDL 3.0  VLDL 32  LDLCALC 51    CBG:  Recent Labs Lab 04/18/16 1126 04/18/16 1636 04/18/16 1814 04/18/16 2208 04/19/16 0759  GLUCAP 145* 120* 210* 178* 112*    Microbiology: No results found for this or any previous visit.  Coagulation Studies: No results for input(s): LABPROT, INR in the last 72 hours.  Imaging: Ct Head Wo Contrast  04/18/2016  CLINICAL DATA:  59 year old female with recent vertigo. Found have mild cerebellar edema associated with  a punctate age indeterminate cerebellar hemorrhage by MRI. Initial encounter. EXAM: CT HEAD WITHOUT CONTRAST TECHNIQUE: Contiguous axial images were obtained from the base of the skull through the vertex without intravenous contrast. COMPARISON:  Brain MRI 04/17/2016. FINDINGS: Visualized paranasal sinuses and mastoids are clear. No acute osseous abnormality identified. Visualized orbits and scalp soft tissues are within normal limits. Cerebral volume is within normal limits for age. No intracranial mass effect. The area of abnormality in the inferior right cerebellum is  evident as mild hypodensity on CT (series 2, image 7) without associated visible blood products by CT. No associated mass effect. Cerebellar and brainstem gray-white matter differentiation otherwise is normal. Normal supratentorial gray-white matter differentiation. No new cortically based infarct. No hyperdense intracranial hemorrhage identified. IMPRESSION: Recently seen right cerebellar abnormality is visible on CT as a small area of mild hypodensity. No macroscopic hemorrhage or other acute intracranial abnormality evident by CT. Electronically Signed   By: Genevie Ann M.D.   On: 04/18/2016 11:32   [name]  04/17/2016  CLINICAL DATA:  Vertigo.  Hypertensive urgency and renal failure. EXAM: MRI HEAD WITHOUT CONTRAST TECHNIQUE: Multiplanar, multiecho pulse sequences of the brain and surrounding structures were obtained without intravenous contrast. COMPARISON:  None. FINDINGS: There is mild motion artifact throughout. There is no evidence of acute infarct, midline shift, or extra-axial fluid collection. Slight diffuse sulcal prominence is compatible with mild cerebral atrophy. Ventricles are normal in size. No significant cerebral white matter disease is seen. There is a 3 mm focus of susceptibility artifact in the inferior right cerebellum with mild surrounding T2 hyperintensity/edema. There is no evidence of intracranial hemorrhage elsewhere. Orbits are unremarkable. There is mild left maxillary sinus mucosal thickening. Mastoid air cells are clear. Major intracranial vascular flow voids are preserved. IMPRESSION: 1. 3 mm focus of blood products in the right cerebellum. This is of uncertain age by MRI, however a small acute/subacute hemorrhage is a concern given evidence of mild surrounding edema and patient's current vertigo. This may be hypertensive in etiology or possibly due to an underlying cavernoma. Noncontrast head CT is recommended. 2. No other evidence of acute intracranial abnormality.  These results will be called to the ordering clinician or representative by the Radiologist Assistant, and communication documented in the PACS or zVision Dashboard. Electronically Signed   By: Logan Bores M.D.   On: 04/17/2016 12:12   US Carotid Bilateral  04/17/2016  CLINICAL DATA:  Intracranial hemorrhage EXAM: BILATERAL CAROTID DUPLEX ULTRASOUND TECHNIQUE: Pearline Cables scale imaging, color Doppler and duplex ultrasound was performed of bilateral carotid and vertebral arteries in the neck. COMPARISON:  None. REVIEW OF SYSTEMS: Quantification of carotid stenosis is based on velocity parameters that correlate the residual internal carotid diameter with NASCET-based stenosis levels, using the diameter of the distal internal carotid lumen as the denominator for stenosis measurement. The following velocity measurements were obtained: PEAK SYSTOLIC/END DIASTOLIC RIGHT ICA:                     128/45cm/sec CCA:                     [id number] SYSTOLIC ICA/CCA RATIO:  [id number] DIASTOLIC ICA/CCA RATIO: [id number] ECA:                     166cm/sec LEFT ICA:                     165/46cm/sec CCA:  [id number] SYSTOLIC ICA/CCA RATIO:  [id number] DIASTOLIC ICA/CCA RATIO: [id number] ECA:                     177cm/sec FINDINGS: RIGHT CAROTID ARTERY: Mild eccentric partially calcified plaque in the bulb and ICA origin. No high-grade stenosis. Normal waveforms and color Doppler signal. RIGHT VERTEBRAL ARTERY:  Normal flow direction and waveform. LEFT CAROTID ARTERY: Calcified plaque in the carotid bulb extending to involve proximal internal and external carotid arteries. No high-grade ICA stenosis. Normal waveforms and color Doppler signal. LEFT VERTEBRAL ARTERY: Normal flow direction and waveform. IMPRESSION: 1. Mild bilateral carotid bifurcation and proximal ICA plaque, resulting in less than 50% diameter stenosis. The exam does not exclude plaque ulceration or embolization. Continued surveillance recommended. 2.  Antegrade bilateral  vertebral arterial flow. Electronically Signed   By: Lucrezia Europe M.D.   On: 04/17/2016 15:08    Medications:  I have reviewed the patient's current medications. Scheduled: . amLODipine  10 mg Oral Daily  . atorvastatin  40 mg Oral Daily  . calcium acetate  667 mg Oral TID WC  . docusate sodium  100 mg Oral BID  . hydrALAZINE  25 mg Oral TID  . insulin aspart  0-5 Units Subcutaneous QHS  . insulin aspart  0-9 Units Subcutaneous TID WC  . metoprolol tartrate  25 mg Oral BID  . sodium bicarbonate  1,300 mg Oral BID    Assessment/Plan: Patient stable.  Head CT repeated and shows no acute blood products.  Carotid dopplers show no evidence of hemodynamically significant stenosis.  Echocardiogram shows no cardiac source of emboli with an EF of 50%.  A1c 7.2, LDL 51.  No further neurologic intervention is recommended at this time.  If further questions arise, please call or page at that time.  Thank you for allowing neurology to participate in the care of this patient.     LOS: 4 days   Alexis Goodell, MD Neurology [phone] 04/19/2016  10:42 AM

## 2016-04-19 NOTE — Progress Notes (Signed)
Dr. Posey Pronto notified of bilateral facial numbness. Hand grips equal bilaterally, no pain or headache noted. Patient Alert and oriented at this time. VSS. No new orders at this time. Continue to monitor.

## 2016-04-19 NOTE — Care Management (Signed)
RW ordered and delivered to room prior to [facility] by [facility]

## 2016-04-19 NOTE — Discharge Instructions (Signed)
Renal diet Carb controlled diet

## 2016-04-19 NOTE — Progress Notes (Signed)
04/19/2016  BP 167/64 mmHg  Pulse 63  Temp(Src) 98.4 F (36.9 C) (Oral)  Resp 18  Ht 4\' 5"  (1.346 m)  Wt 47.219 kg (104 lb 1.6 oz)  BMI 26.06 kg/m2  SpO2 91%  LMP 09/19/2001 (Approximate) Patient discharged per MD orders. Discharge instructions reviewed with patient and family via interpreter. Patient and family  verbalized understanding. IV removed per policy. Prescriptions discussed and given to patient. Family to pick up medications at clinic. Discharged via wheelchair escorted by nursing staff.  Almedia Balls, RN

## 2016-04-19 NOTE — Care Management (Signed)
Patient to discharge today.  Patient has been ordered home heath.  [facility] through [facility].  [name] with [facility].  Financial information was faxed to [facility] clinic 04/18/16.  Left message 04/19/16 to follow up to see what the new co payment would be.  Encouraged patient and family to call and follow up with clinic.  Hard copy of scripts were sent to medication management and will be ready for pick up at 2 pm today.  Patient's daughter to pick up medication after discharge.  RNCM signing off

## 2016-04-23 ENCOUNTER — Emergency Department: Payer: Self-pay

## 2016-04-23 ENCOUNTER — Encounter: Payer: Self-pay | Admitting: Emergency Medicine

## 2016-04-23 ENCOUNTER — Other Ambulatory Visit: Payer: Self-pay

## 2016-04-23 ENCOUNTER — Inpatient Hospital Stay
Admission: EM | Admit: 2016-04-23 | Discharge: 2016-05-07 | DRG: 291 | Disposition: A | Discharge status: Home Health | Payer: Self-pay | Attending: Internal Medicine | Admitting: Internal Medicine

## 2016-04-23 DIAGNOSIS — N390 Urinary tract infection, site not specified: Secondary | ICD-10-CM | POA: Diagnosis present

## 2016-04-23 DIAGNOSIS — E871 Hypo-osmolality and hyponatremia: Secondary | ICD-10-CM | POA: Diagnosis present

## 2016-04-23 DIAGNOSIS — R059 Cough, unspecified: Secondary | ICD-10-CM

## 2016-04-23 DIAGNOSIS — R05 Cough: Secondary | ICD-10-CM

## 2016-04-23 DIAGNOSIS — E1140 Type 2 diabetes mellitus with diabetic neuropathy, unspecified: Secondary | ICD-10-CM | POA: Diagnosis present

## 2016-04-23 DIAGNOSIS — E872 Acidosis: Secondary | ICD-10-CM | POA: Diagnosis present

## 2016-04-23 DIAGNOSIS — E1165 Type 2 diabetes mellitus with hyperglycemia: Secondary | ICD-10-CM | POA: Diagnosis present

## 2016-04-23 DIAGNOSIS — I5033 Acute on chronic diastolic (congestive) heart failure: Secondary | ICD-10-CM | POA: Diagnosis present

## 2016-04-23 DIAGNOSIS — R531 Weakness: Secondary | ICD-10-CM

## 2016-04-23 DIAGNOSIS — E785 Hyperlipidemia, unspecified: Secondary | ICD-10-CM | POA: Diagnosis present

## 2016-04-23 DIAGNOSIS — D631 Anemia in chronic kidney disease: Secondary | ICD-10-CM | POA: Diagnosis present

## 2016-04-23 DIAGNOSIS — I2510 Atherosclerotic heart disease of native coronary artery without angina pectoris: Secondary | ICD-10-CM | POA: Diagnosis present

## 2016-04-23 DIAGNOSIS — E875 Hyperkalemia: Secondary | ICD-10-CM | POA: Diagnosis present

## 2016-04-23 DIAGNOSIS — E1121 Type 2 diabetes mellitus with diabetic nephropathy: Secondary | ICD-10-CM | POA: Diagnosis present

## 2016-04-23 DIAGNOSIS — N170 Acute kidney failure with tubular necrosis: Secondary | ICD-10-CM | POA: Diagnosis present

## 2016-04-23 DIAGNOSIS — I951 Orthostatic hypotension: Secondary | ICD-10-CM | POA: Diagnosis present

## 2016-04-23 DIAGNOSIS — E11319 Type 2 diabetes mellitus with unspecified diabetic retinopathy without macular edema: Secondary | ICD-10-CM | POA: Diagnosis present

## 2016-04-23 DIAGNOSIS — I132 Hypertensive heart and chronic kidney disease with heart failure and with stage 5 chronic kidney disease, or end stage renal disease: Principal | ICD-10-CM | POA: Diagnosis present

## 2016-04-23 DIAGNOSIS — N189 Chronic kidney disease, unspecified: Secondary | ICD-10-CM

## 2016-04-23 DIAGNOSIS — Z992 Dependence on renal dialysis: Secondary | ICD-10-CM

## 2016-04-23 DIAGNOSIS — K5900 Constipation, unspecified: Secondary | ICD-10-CM | POA: Diagnosis present

## 2016-04-23 DIAGNOSIS — E1122 Type 2 diabetes mellitus with diabetic chronic kidney disease: Secondary | ICD-10-CM | POA: Diagnosis present

## 2016-04-23 DIAGNOSIS — Z794 Long term (current) use of insulin: Secondary | ICD-10-CM

## 2016-04-23 DIAGNOSIS — I083 Combined rheumatic disorders of mitral, aortic and tricuspid valves: Secondary | ICD-10-CM | POA: Diagnosis present

## 2016-04-23 DIAGNOSIS — N19 Unspecified kidney failure: Secondary | ICD-10-CM

## 2016-04-23 DIAGNOSIS — B955 Unspecified streptococcus as the cause of diseases classified elsewhere: Secondary | ICD-10-CM | POA: Diagnosis present

## 2016-04-23 DIAGNOSIS — N2581 Secondary hyperparathyroidism of renal origin: Secondary | ICD-10-CM | POA: Diagnosis present

## 2016-04-23 DIAGNOSIS — N186 End stage renal disease: Secondary | ICD-10-CM | POA: Diagnosis present

## 2016-04-23 DIAGNOSIS — R001 Bradycardia, unspecified: Secondary | ICD-10-CM

## 2016-04-23 DIAGNOSIS — M810 Age-related osteoporosis without current pathological fracture: Secondary | ICD-10-CM | POA: Diagnosis present

## 2016-04-23 DIAGNOSIS — Z8249 Family history of ischemic heart disease and other diseases of the circulatory system: Secondary | ICD-10-CM

## 2016-04-23 DIAGNOSIS — N179 Acute kidney failure, unspecified: Secondary | ICD-10-CM

## 2016-04-23 LAB — BASIC METABOLIC PANEL
ANION GAP: 8 (ref 5–15)
BUN: 71 mg/dL — ABNORMAL HIGH (ref 6–20)
CO2: 23 mmol/L (ref 22–32)
Calcium: 8.3 mg/dL — ABNORMAL LOW (ref 8.9–10.3)
Chloride: 97 mmol/L — ABNORMAL LOW (ref 101–111)
Creatinine, Ser: 4.26 mg/dL — ABNORMAL HIGH (ref 0.44–1.00)
GFR calc Af Amer: 12 mL/min — ABNORMAL LOW (ref >60)
GFR, EST NON AFRICAN AMERICAN: 10 mL/min — AB (ref >60)
GLUCOSE: 89 mg/dL (ref 65–99)
POTASSIUM: 5.1 mmol/L (ref 3.5–5.1)
Sodium: 128 mmol/L — ABNORMAL LOW (ref 135–145)

## 2016-04-23 LAB — URINALYSIS COMPLETE WITH MICROSCOPIC (ARMC ONLY)
Bilirubin Urine: NEGATIVE
GLUCOSE, UA: 50 mg/dL — AB
Hgb urine dipstick: NEGATIVE
KETONES UR: NEGATIVE mg/dL
Nitrite: NEGATIVE
Protein, ur: >500 mg/dL — AB
Specific Gravity, Urine: 1.013 (ref 1.005–1.030)
pH: 5 (ref 5.0–8.0)

## 2016-04-23 LAB — CBC
HCT: 28.9 % — ABNORMAL LOW (ref 35.0–47.0)
HEMOGLOBIN: 9.5 g/dL — AB (ref 12.0–16.0)
MCH: 28.2 pg (ref 26.0–34.0)
MCHC: 33 g/dL (ref 32.0–36.0)
MCV: 85.5 fL (ref 80.0–100.0)
PLATELETS: 295 10*3/uL (ref 150–440)
RBC: 3.38 MIL/uL — ABNORMAL LOW (ref 3.80–5.20)
RDW: 16.3 % — ABNORMAL HIGH (ref 11.5–14.5)
WBC: 7.5 10*3/uL (ref 3.6–11.0)

## 2016-04-23 LAB — GLUCOSE, CAPILLARY: GLUCOSE-CAPILLARY: 79 mg/dL (ref 65–99)

## 2016-04-23 MED ORDER — SODIUM CHLORIDE 0.9 % IV SOLN: INTRAVENOUS | Status: DC

## 2016-04-23 MED ORDER — HEPARIN SODIUM (PORCINE) 5000 UNIT/ML IJ SOLN
5000.0000 [IU] | Freq: Three times a day (TID) | INTRAMUSCULAR | Status: DC
Start: 2016-04-23 — End: 2016-05-07
  Administered 2016-04-23 – 2016-05-07 (×38): 5000 [IU] via SUBCUTANEOUS
  Filled 2016-04-23 (×39): qty 1

## 2016-04-23 MED ORDER — ATORVASTATIN CALCIUM 20 MG PO TABS
40.0000 mg | ORAL_TABLET | Freq: Every day | ORAL | Status: DC
  Administered 2016-04-23 – 2016-05-06 (×14): 40 mg via ORAL
  Filled 2016-04-23 (×14): qty 2

## 2016-04-23 MED ORDER — SODIUM CHLORIDE 0.9 % IV BOLUS (SEPSIS): 1000.0000 mL | Freq: Once | INTRAVENOUS | Status: DC

## 2016-04-23 MED ORDER — INSULIN ASPART 100 UNIT/ML ~~LOC~~ SOLN
0.0000 [IU] | Freq: Three times a day (TID) | SUBCUTANEOUS | Status: DC
  Administered 2016-04-24: 1 [IU] via SUBCUTANEOUS
  Administered 2016-04-24: 2 [IU] via SUBCUTANEOUS
  Administered 2016-04-26 (×2): 1 [IU] via SUBCUTANEOUS
  Administered 2016-04-27: 2 [IU] via SUBCUTANEOUS
  Administered 2016-04-28: 1 [IU] via SUBCUTANEOUS
  Administered 2016-04-28: 3 [IU] via SUBCUTANEOUS
  Administered 2016-04-29: 5 [IU] via SUBCUTANEOUS
  Administered 2016-05-01: 3 [IU] via SUBCUTANEOUS
  Administered 2016-05-01: 2 [IU] via SUBCUTANEOUS
  Administered 2016-05-02: 3 [IU] via SUBCUTANEOUS
  Administered 2016-05-03 (×2): 2 [IU] via SUBCUTANEOUS
  Administered 2016-05-04: 1 [IU] via SUBCUTANEOUS
  Administered 2016-05-05: 5 [IU] via SUBCUTANEOUS
  Administered 2016-05-05: 1 [IU] via SUBCUTANEOUS
  Administered 2016-05-06: 3 [IU] via SUBCUTANEOUS
  Administered 2016-05-06: 5 [IU] via SUBCUTANEOUS
  Administered 2016-05-07 (×2): 1 [IU] via SUBCUTANEOUS
  Filled 2016-04-23 (×2): qty 1
  Filled 2016-04-23: qty 2
  Filled 2016-04-23: qty 5
  Filled 2016-04-23: qty 1
  Filled 2016-04-23: qty 2
  Filled 2016-04-23 (×2): qty 3
  Filled 2016-04-23: qty 1
  Filled 2016-04-23: qty 5
  Filled 2016-04-23: qty 1
  Filled 2016-04-23 (×2): qty 2
  Filled 2016-04-23: qty 1
  Filled 2016-04-23: qty 3
  Filled 2016-04-23: qty 1
  Filled 2016-04-23: qty 2
  Filled 2016-04-23: qty 3
  Filled 2016-04-23: qty 1
  Filled 2016-04-23: qty 5

## 2016-04-23 MED ORDER — SODIUM CHLORIDE 0.9 % IV SOLN
Freq: Once | INTRAVENOUS | Status: AC
  Administered 2016-04-23: 17:00:00 via INTRAVENOUS

## 2016-04-23 MED ORDER — CALCIUM ACETATE (PHOS BINDER) 667 MG PO CAPS
667.0000 mg | ORAL_CAPSULE | Freq: Three times a day (TID) | ORAL | Status: DC
  Administered 2016-04-24 – 2016-05-06 (×24): 667 mg via ORAL
  Filled 2016-04-23 (×25): qty 1

## 2016-04-23 NOTE — H&P (Signed)
[facility] NAME: Anita Contreras    MR#:  [id number]  DATE OF BIRTH:  05-28-1956  DATE OF ADMISSION:  04/23/2016  PRIMARY CARE PHYSICIAN: WHITE, Arlie Solomons, FNP   REQUESTING/REFERRING PHYSICIAN: Gwyndolyn Saxon  CHIEF COMPLAINT:   Chief Complaint  Patient presents with  . Dizziness    HISTORY OF PRESENT ILLNESS: Anita Contreras  is a 60 y.o. female with a known history of Neuropathy, diabetic retinopathy, vitamin D deficiency, type 2 diabetes with ophthalmic manifestation, hyperlipidemia, hypertension, chronic kidney disease, osteoporosis- was in the hospital last week with accelerated hypertension and some worsening in the renal function and she was sent home after adjusting the blood pressure medications 4 days ago.  She was getting more and more weak and feeling dizzy so finally came to emergency room again today, noted to have normal blood pressure but some bradycardia. Her renal function noted to be worse than what it was 4 days ago, so ER physician spoke to her nephrologist and he suggested to admit to hospital. On further questioning patient with the help of translator and her daughter also complained that whenever she go to bathroom she has to wait for a few minutes before she could actually urinate. Urinalysis is ordered by ER but so far she was not able to give any sample.  PAST MEDICAL HISTORY:   Past Medical History  Diagnosis Date  . Acute cystitis with hematuria   . Neuropathy ([place])   . Migraine variant   . Cataracts, bilateral   . Diabetic retinopathy, background ([place])   . Dizziness   . Vitamin D deficiency   . Personal history of noncompliance with medical treatment, presenting hazards to health   . Type II diabetes mellitus with ophthalmic manifestations, uncontrolled ([place])   . Hyperlipidemia   . OP (osteoporosis)   . Hypertension   . CKD (chronic kidney disease)     PAST SURGICAL HISTORY: History  reviewed. No pertinent past surgical history.  SOCIAL HISTORY:  Social History  Substance Use Topics  . Smoking status: Never Smoker   . Smokeless tobacco: Not on file  . Alcohol Use: No    FAMILY HISTORY:  Family History  Problem Relation Age of Onset  . Hypertension Mother   . Hyperlipidemia Mother     DRUG ALLERGIES: No Known Allergies  REVIEW OF SYSTEMS:   CONSTITUTIONAL: No fever,pisitive for fatigue or weakness.  EYES: No blurred or double vision.  EARS, NOSE, AND THROAT: No tinnitus or ear pain.  RESPIRATORY: No cough, shortness of breath, wheezing or hemoptysis.  CARDIOVASCULAR: No chest pain, orthopnea, edema.  GASTROINTESTINAL: No nausea, vomiting, diarrhea or abdominal pain.  GENITOURINARY: No dysuria, hematuria.  ENDOCRINE: No polyuria, nocturia,  HEMATOLOGY: No anemia, easy bruising or bleeding SKIN: No rash or lesion. MUSCULOSKELETAL: No joint pain or arthritis.   NEUROLOGIC: No tingling, numbness, weakness.  PSYCHIATRY: No anxiety or depression.   MEDICATIONS AT HOME:  Prior to Admission medications   Medication Sig Start Date End Date Taking? Authorizing Provider  acetaminophen (TYLENOL) 500 MG tablet Take 1,000 mg by mouth every 6 (six) hours as needed for mild pain or headache.   Yes Historical Provider, MD  amLODipine (NORVASC) 10 MG tablet Take 1 tablet (10 mg total) by mouth daily. 04/19/16  Yes Fritzi Mandes, MD  atorvastatin (LIPITOR) 40 MG tablet Take 40 mg by mouth at bedtime.    Yes Historical Provider, MD  calcium acetate (PHOSLO) 667 MG  capsule Take 1 capsule (667 mg total) by mouth 3 (three) times daily with meals. 04/19/16  Yes Fritzi Mandes, MD  hydrALAZINE (APRESOLINE) 25 MG tablet Take 1 tablet (25 mg total) by mouth 3 (three) times daily. 04/19/16  Yes Fritzi Mandes, MD  insulin NPH-regular Human (NOVOLIN 70/30) (70-30) 100 UNIT/ML injection Inject 15 Units into the skin daily.    Yes Historical Provider, MD  meclizine (ANTIVERT) 25 MG tablet Take 1  tablet (25 mg total) by mouth 3 (three) times daily as needed for dizziness. 04/19/16  Yes Fritzi Mandes, MD  metoprolol tartrate (LOPRESSOR) 25 MG tablet Take 1 tablet (25 mg total) by mouth 2 (two) times daily. 04/19/16  Yes Fritzi Mandes, MD      PHYSICAL EXAMINATION:   VITAL SIGNS: Blood pressure 117/45, pulse 48, temperature 97.4 F (36.3 C), temperature source Oral, resp. rate 17, height 4\' 5"  (1.346 m), weight 43.999 kg (97 lb), last menstrual period 09/19/2001, SpO2 97 %.  GENERAL:  60 y.o.-year-old patient lying in the bed with no acute distress.  EYES: Pupils equal, round, reactive to light and accommodation. No scleral icterus. Extraocular muscles intact.  HEENT: Head atraumatic, normocephalic. Oropharynx and nasopharynx clear.  NECK:  Supple, no jugular venous distention. No thyroid enlargement, no tenderness.  LUNGS: Normal breath sounds bilaterally, no wheezing, some crepitation. No use of accessory muscles of respiration.  CARDIOVASCULAR: S1, S2 normal. No murmurs, rubs, or gallops.  ABDOMEN: Soft, nontender, nondistended. Bowel sounds present. No organomegaly or mass.  EXTREMITIES: No pedal edema, cyanosis, or clubbing.  NEUROLOGIC: Cranial nerves II through XII are intact. Muscle strength 5/5 in all extremities. Sensation intact. Gait not checked.  PSYCHIATRIC: The patient is alert and oriented x 3.  SKIN: No obvious rash, lesion, or ulcer.   LABORATORY PANEL:   CBC  Recent Labs Lab 04/17/16 0336 04/23/16 1620  WBC 5.7 7.5  HGB 7.6* 9.5*  HCT 22.7* 28.9*  PLT 162 295  MCV 83.6 85.5  MCH 27.8 28.2  MCHC 33.3 33.0  RDW 15.0* 16.3*   ------------------------------------------------------------------------------------------------------------------  Chemistries   Recent Labs Lab 04/17/16 0336 04/18/16 0339 04/18/16 1519 04/23/16 1620  NA 138 132*  --  128*  K 5.0 5.6* 4.9 5.1  CL 111 108  --  97*  CO2 22 18*  --  23  GLUCOSE 138* 108*  --  89  BUN 50* 62*  --   71*  CREATININE 3.29* 3.63*  --  4.26*  CALCIUM 7.5* 7.8*  --  8.3*   ------------------------------------------------------------------------------------------------------------------ estimated creatinine clearance is 7.8 mL/min (by C-G formula based on Cr of 4.26). ------------------------------------------------------------------------------------------------------------------ No results for input(s): TSH, T4TOTAL, T3FREE, THYROIDAB in the last 72 hours.  Invalid input(s): FREET3   Coagulation profile No results for input(s): INR, PROTIME in the last 168 hours. ------------------------------------------------------------------------------------------------------------------- No results for input(s): DDIMER in the last 72 hours. -------------------------------------------------------------------------------------------------------------------  Cardiac Enzymes No results for input(s): CKMB, TROPONINI, MYOGLOBIN in the last 168 hours.  Invalid input(s): CK ------------------------------------------------------------------------------------------------------------------ Invalid input(s): POCBNP  ---------------------------------------------------------------------------------------------------------------  Urinalysis    Component Value Date/Time   COLORURINE YELLOW* 04/15/2016 1025   APPEARANCEUR CLEAR* 04/15/2016 1025   LABSPEC 1.005 04/15/2016 1025   PHURINE 6.0 04/15/2016 1025   GLUCOSEU 2+* 04/15/2016 1025   HGBUR NEGATIVE 04/15/2016 1025   [place] 04/15/2016 1025   [place] 04/15/2016 1025   PROTEINUR 3+* 04/15/2016 1025   NITRITE NEGATIVE 04/15/2016 1025   LEUKOCYTESUR NEGATIVE 04/15/2016 1025     RADIOLOGY: Dg Chest  1 View  04/23/2016  CLINICAL DATA:  Dizziness and weakness EXAM: CHEST 1 VIEW COMPARISON:  None. FINDINGS: Cardiac shadow is enlarged. Bilateral pleural effusions and bibasilar atelectatic changes are noted. Mild vascular  congestion is seen as well. No bony abnormality is noted. IMPRESSION: Changes of CHF with bibasilar effusions. Electronically Signed   By: Inez Catalina M.D.   On: 04/23/2016 18:19    EKG: Orders placed or performed during the hospital encounter of 04/23/16  . ED EKG  . ED EKG    IMPRESSION AND PLAN:  * Ac on ch renal failure   Will get UA, renal US, iv fluids.   Nephrology consult called in by ER.   Her blood pressure was running lower normal side, maybe this is the reason is worsening in renal function, I will hold oral antihypertensive medication at this time.  * Dizziness  Heart rate is around 50s and blood pressure running in lower normal side   On standing up from lying down and there is 30 point drop in the blood pressure which is significant orthostatic drop   Hold antihypertensive medications at this time.   Patient was here 1 week ago with hypertensive urgency.  * Chronic anemia due to renal failure  Stable, continue monitoring.  * Diabetes  Because of worsening in renal function now just keep her on insulin sliding scale coverage and will not give any baseline basal insulin.   All the records are reviewed and case discussed with ED provider. Management plans discussed with the patient, family and they are in agreement.  CODE STATUS: Full code Code Status History    Date Active Date Inactive Code Status Order ID Comments User Context   04/15/2016  7:18 PM 04/19/2016  4:08 PM Full Code [id number]  Gladstone Lighter, MD Inpatient       TOTAL TIME TAKING CARE OF THIS PATIENT: 50 minutes.   Assessment patient's daughter and other family members in the room.  Vaughan Basta M.D on 04/23/2016   Between 7am to 6pm - Pager - [phone]  After 6pm go to [email or web address] - password [email or web address] [facility]  Office  [phone]  CC: Primary care physician; WHITE, Arlie Solomons, FNP   Note: This dictation was prepared with Dragon dictation along  with smaller phrase technology. Any transcriptional errors that result from this process are unintentional.

## 2016-04-23 NOTE — ED Notes (Addendum)
Pt to ed with c/o dizziness x 1 week.  Pt was seen today at [facility] and sent here for eval of low oxygen and dizziness.  Pt denies chest pain, does report sob. Pt also reports general lethargy.

## 2016-04-23 NOTE — Progress Notes (Signed)
Pt. here with Acute Kidney Injury, became SOB in ED whilst bolus running and had to be put on O2 2L. On admission fluids ordered at 14 mls/hr, MD called to verify order and new orders given to hold fluids.

## 2016-04-23 NOTE — ED Provider Notes (Addendum)
[facility] Emergency Department Provider Note        Time seen: ----------------------------------------- 4:29 PM on 04/23/2016 -----------------------------------------    I have reviewed the triage vital signs and the nursing notes.   HISTORY  Chief Complaint Dizziness    HPI Anita Contreras is a 60 y.o. female who presents to ER for dizziness. Patient states she has not been feeling well today, didn't take her medicines this morning. According to reports she was recently added on metoprolol. Patient was also recently admitted into the hospital forhypertensive urgency, acute kidney injury and other complaints. Family denies any fever or other complaints at this time. Nothing makes her symptoms better or worse.   Past Medical History  Diagnosis Date  . Acute cystitis with hematuria   . Neuropathy ([place])   . Migraine variant   . Cataracts, bilateral   . Diabetic retinopathy, background ([place])   . Dizziness   . Vitamin D deficiency   . Personal history of noncompliance with medical treatment, presenting hazards to health   . Type II diabetes mellitus with ophthalmic manifestations, uncontrolled ([place])   . Hyperlipidemia   . OP (osteoporosis)   . Hypertension   . CKD (chronic kidney disease)     Patient Active Problem List   Diagnosis Date Noted  . 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 Acute right flank pain   . ARF (acute renal failure) ([place]) 04/15/2016  . Acute cystitis 05/26/2015  . Neuropathy ([place]) 05/26/2015    History reviewed. No pertinent past surgical history.  Allergies Review of patient's allergies indicates no known allergies.  Social History Social History  Substance Use Topics  . Smoking status: Never Smoker   . Smokeless tobacco: None  . Alcohol Use: No    Review of Systems Constitutional: Negative for fever. Eyes: Negative for visual changes. ENT: Negative for sore  throat. Cardiovascular: Negative for chest pain. Respiratory: Negative for shortness of breath. Gastrointestinal: Negative for abdominal pain, vomiting and diarrhea. Genitourinary: Negative for dysuria. Musculoskeletal: Negative for back pain. Skin: Negative for rash. Neurological: Positive for weakness  10-point ROS otherwise negative.  ____________________________________________   PHYSICAL EXAM:  VITAL SIGNS: ED Triage Vitals  Enc Vitals Group     BP 04/23/16 1610 127/44 mmHg     Pulse Rate 04/23/16 1610 47     Resp 04/23/16 1610 20     Temp 04/23/16 1610 97.4 F (36.3 C)     Temp Source 04/23/16 1610 Oral     SpO2 04/23/16 1610 94 %     Weight 04/23/16 1610 97 lb (43.999 kg)     Height 04/23/16 1610 4\' 5"  (1.346 m)     Head Cir --      Peak Flow --      Pain Score 04/23/16 1611 2     Pain Loc --      Pain Edu? --      Excl. in [place]? --     Constitutional: Alert and oriented. Lethargic, no acute distress Eyes: Conjunctivae are normal. PERRL. Normal extraocular movements. ENT   Head: Normocephalic and atraumatic.   Nose: No congestion/rhinnorhea.   Mouth/Throat: Mucous membranes are moist.   Neck: No stridor. Cardiovascular: Slow rate, regular rhythm. No murmurs, rubs, or gallops. Respiratory: Normal respiratory effort without tachypnea nor retractions. Breath sounds are clear and equal bilaterally. No wheezes/rales/rhonchi. Gastrointestinal: Soft and nontender. Normal bowel sounds Musculoskeletal: Nontender with normal range of motion in all extremities. No lower extremity  tenderness nor edema. Neurologic:  Normal speech and language. No gross focal neurologic deficits are appreciated.  Skin:  Skin is warm, dry and intact. No rash noted. Psychiatric: Depressed mood and affect ____________________________________________  EKG: Interpreted by me. Sinus bradycardia with a rate of 48 bpm, normal PR interval, normal QRS width, normal QT interval,  nonspecific ST and T-wave changes, normal axis ____________________________________________  ED COURSE:  Pertinent labs & imaging results that were available during my care of the patient were reviewed by me and considered in my medical decision making (see chart for details). Patient presents to the ER in no acute distress but does appear weak. She is bradycardic which is likely from the metoprolol. The pressure appears stable, we will check basic labs and reevaluate. ____________________________________________    LABS (pertinent positives/negatives)  Labs Reviewed  BASIC METABOLIC PANEL - Abnormal; Notable for the following:    Sodium 128 (*)    Chloride 97 (*)    BUN 71 (*)    Creatinine, Ser 4.26 (*)    Calcium 8.3 (*)    GFR calc non Af Amer 10 (*)    GFR calc Af Amer 12 (*)    All other components within normal limits  CBC - Abnormal; Notable for the following:    RBC 3.38 (*)    Hemoglobin 9.5 (*)    HCT 28.9 (*)    RDW 16.3 (*)    All other components within normal limits  URINALYSIS COMPLETEWITH MICROSCOPIC ([facility] ONLY)  CBG MONITORING, ED   Chest x-ray: Reviewed and interpreted by me, bilateral pleural effusions are evident ____________________________________________  FINAL ASSESSMENT AND PLAN  Weakness, bradycardia, acute on chronic renal failure, hyponatremia  Plan: Patient with labs as dictated above. Patient was given fluids here in the ER which resulted in some shortness of breath. Her chest x-ray reveals pleural effusions with some pulmonary edema present. Her creatinine continues to get worse. She has not made urine yet while in the ER. I discussed with Dr. Candiss Norse from nephrology who agrees with admission.   Earleen Newport, MD   Note: This dictation was prepared with Dragon dictation. Any transcriptional errors that result from this process are unintentional   Earleen Newport, MD 04/23/16 [id number]  Earleen Newport, MD 04/23/16 [id number]

## 2016-04-24 ENCOUNTER — Inpatient Hospital Stay: Payer: Self-pay

## 2016-04-24 LAB — GLUCOSE, CAPILLARY
GLUCOSE-CAPILLARY: 108 mg/dL — AB (ref 65–99)
Glucose-Capillary: 75 mg/dL (ref 65–99)
Glucose-Capillary: 171 mg/dL — ABNORMAL HIGH (ref 65–99)
Glucose-Capillary: 125 mg/dL — ABNORMAL HIGH (ref 65–99)

## 2016-04-24 LAB — CBC
HCT: 25.4 % — ABNORMAL LOW (ref 35.0–47.0)
HEMOGLOBIN: 8.3 g/dL — AB (ref 12.0–16.0)
MCH: 27.9 pg (ref 26.0–34.0)
MCHC: 32.7 g/dL (ref 32.0–36.0)
MCV: 85.5 fL (ref 80.0–100.0)
PLATELETS: 249 10*3/uL (ref 150–440)
RBC: 2.97 MIL/uL — AB (ref 3.80–5.20)
RDW: 16 % — ABNORMAL HIGH (ref 11.5–14.5)
WBC: 6.9 10*3/uL (ref 3.6–11.0)

## 2016-04-24 LAB — BASIC METABOLIC PANEL
Anion gap: 7 (ref 5–15)
BUN: 80 mg/dL — AB (ref 6–20)
CALCIUM: 7.7 mg/dL — AB (ref 8.9–10.3)
CO2: 21 mmol/L — AB (ref 22–32)
CREATININE: 4.33 mg/dL — AB (ref 0.44–1.00)
Chloride: 104 mmol/L (ref 101–111)
GFR calc Af Amer: 12 mL/min — ABNORMAL LOW (ref >60)
GFR, EST NON AFRICAN AMERICAN: 10 mL/min — AB (ref >60)
GLUCOSE: 97 mg/dL (ref 65–99)
Potassium: 5.7 mmol/L — ABNORMAL HIGH (ref 3.5–5.1)
Sodium: 132 mmol/L — ABNORMAL LOW (ref 135–145)

## 2016-04-24 LAB — POTASSIUM: Potassium: 5.5 mmol/L — ABNORMAL HIGH (ref 3.5–5.1)

## 2016-04-24 MED ORDER — DEXTROSE 5 % IV SOLN
1.0000 g | INTRAVENOUS | Status: DC
  Administered 2016-04-24 – 2016-04-28 (×4): 1 g via INTRAVENOUS
  Filled 2016-04-24 (×6): qty 10

## 2016-04-24 MED ORDER — SODIUM POLYSTYRENE SULFONATE 15 GM/60ML PO SUSP: 30.0000 g | Freq: Two times a day (BID) | ORAL | Status: DC

## 2016-04-24 MED ORDER — CHLORHEXIDINE GLUCONATE CLOTH 2 % EX PADS
6.0000 | MEDICATED_PAD | Freq: Once | CUTANEOUS | Status: AC
  Administered 2016-04-25: 6 via TOPICAL

## 2016-04-24 MED ORDER — PATIROMER SORBITEX CALCIUM 8.4 G PO PACK
8.4000 g | PACK | Freq: Every day | ORAL | Status: DC
  Administered 2016-04-25: 8.4 g via ORAL
  Filled 2016-04-24 (×2): qty 4

## 2016-04-24 MED ORDER — PATIROMER SORBITEX CALCIUM 8.4 G PO PACK
8.4000 g | PACK | Freq: Every day | ORAL | Status: DC
  Administered 2016-04-24: 8.4 g via ORAL
  Filled 2016-04-24 (×2): qty 4

## 2016-04-24 MED ORDER — SODIUM CHLORIDE 0.9 % IV SOLN
INTRAVENOUS | Status: DC
  Administered 2016-04-25: 07:00:00 via INTRAVENOUS

## 2016-04-24 NOTE — Progress Notes (Signed)
Initial Nutrition Assessment  DOCUMENTATION CODES:   Not applicable  INTERVENTION:  -Monitor intake.  May need to liberalize diet if unable to meet nutritional needs -Will follow-up with diet education (renal) pending poc.    NUTRITION DIAGNOSIS:   Inadequate oral intake related to acute illness as evidenced by per patient/family report.    GOAL:   Patient will meet greater than or equal to 90% of their needs    MONITOR:   PO intake, Labs  REASON FOR ASSESSMENT:   Malnutrition Screening Tool    ASSESSMENT:   60 y/o female admitted with acute renal failure, hyperkalemia, bradycardia. Planning permacath placement on 5/18   Past Medical History  Diagnosis Date  . Acute cystitis with hematuria   . Neuropathy ([place])   . Migraine variant   . Cataracts, bilateral   . Diabetic retinopathy, background ([place])   . Dizziness   . Vitamin D deficiency   . Personal history of noncompliance with medical treatment, presenting hazards to health   . Type II diabetes mellitus with ophthalmic manifestations, uncontrolled ([place])   . Hyperlipidemia   . OP (osteoporosis)   . Hypertension   . CKD (chronic kidney disease)      Spanish interpreter present. Pt reports appetite has been poor for the past 15 days prior to admission.  Eating 25% of meals.  Otherwise normal intake per pt.   Medications reviewed aspart, phoslo Labs reviewed Na 132, K 5.7  Nutrition-Focused physical exam completed. Findings are WDL for fat depletion, muscle depletion, and edema.     Diet Order:  Diet renal with fluid restriction Fluid restriction:: 1200 mL Fluid; Room service appropriate?: Yes; Fluid consistency:: Thin Diet NPO time specified Except for: Ice Chips, Sips with Meds  Skin:  Reviewed, no issues  Last BM:  5/17  Height:   Ht Readings from Last 1 Encounters:  04/24/16 4\' 5"  (1.346 m)    Weight: Pt reports wt of 120 pounds 2 months ago current wt. Measured pt in bed during visit using  bed scales (126 pounds noted)  Wt Readings from Last 1 Encounters:  04/24/16 126 lb (57.153 kg)    Ideal Body Weight:     BMI:  Body mass index is 31.55 kg/(m^2).  Estimated Nutritional Needs:   Kcal:  [id number] kcals/d  Protein:  63-71 g/d  Fluid:  1.2-1.4 L/d  EDUCATION NEEDS:   Education needs no appropriate at this time  Loukas Antonson B. Zenia Resides, [place], [place] (pager) Weekend/On-Call pager [phone])

## 2016-04-24 NOTE — Progress Notes (Signed)
Subjective:   Conversation through [place] interpreter Patient States she was seen in the outpatient clinics where she was told her heart rate was too low. She also had difficulty breathing Upon admission, she is noted to have high potassium at 5.7, increased creatinine of 4.33, GFR 10  Objective:  Vital signs in last 24 hours:  Temp:  [97.4 F (36.3 C)-98.1 F (36.7 C)] 98.1 F (36.7 C) (05/17 0538) Pulse Rate:  [44-54] 54 (05/17 0538) Resp:  [16-20] 19 (05/17 0538) BP: (108-146)/(44-68) 146/48 mmHg (05/17 0538) SpO2:  [94 %-100 %] 100 % (05/17 0538) Weight:  [43.999 kg (97 lb)] 43.999 kg (97 lb) (05/16 1610)  Weight change:  Filed Weights   04/23/16 1610  Weight: 43.999 kg (97 lb)    Intake/Output:    Intake/Output Summary (Last 24 hours) at 04/24/16 1036 Last data filed at 04/24/16 0957  Gross per 24 hour  Intake    340 ml  Output    303 ml  Net     37 ml     Physical Exam: General: Laying in the bed, no acute distress  HEENT Anicteric, moist oral mucous membranes  Neck supple  Pulm/lungs Normal effort, room air, clear to auscultation  CVS/Heart No rub or gallop  Abdomen:  Soft, nontender, nondistended  Extremities: Trace peripheral edema  Neurologic: Alert, oriented  Skin: No acute rashes          Basic Metabolic Panel:   Recent Labs Lab 04/18/16 0339 04/18/16 1519 04/23/16 1620 04/24/16 0350  NA 132*  --  128* 132*  K 5.6* 4.9 5.1 5.7*  CL 108  --  97* 104  CO2 18*  --  23 21*  GLUCOSE 108*  --  89 97  BUN 62*  --  71* 80*  CREATININE 3.63*  --  4.26* 4.33*  CALCIUM 7.8*  --  8.3* 7.7*  PHOS 5.5*  --   --   --      CBC:  Recent Labs Lab 04/23/16 1620 04/24/16 0350  WBC 7.5 6.9  HGB 9.5* 8.3*  HCT 28.9* 25.4*  MCV 85.5 85.5  PLT 295 249      Microbiology:  No results found for this or any previous visit (from the past 720 hour(s)).  Coagulation Studies: No results for input(s): LABPROT, INR in the last 72  hours.  Urinalysis:  Recent Labs  04/23/16 2143  COLORURINE YELLOW*  LABSPEC 1.013  PHURINE 5.0  GLUCOSEU 50*  HGBUR NEGATIVE  BILIRUBINUR NEGATIVE  KETONESUR NEGATIVE  PROTEINUR >500*  NITRITE NEGATIVE  LEUKOCYTESUR 3+*      Imaging: Dg Chest 1 View  04/23/2016  CLINICAL DATA:  Dizziness and weakness EXAM: CHEST 1 VIEW COMPARISON:  None. FINDINGS: Cardiac shadow is enlarged. Bilateral pleural effusions and bibasilar atelectatic changes are noted. Mild vascular congestion is seen as well. No bony abnormality is noted. IMPRESSION: Changes of CHF with bibasilar effusions. Electronically Signed   By: Inez Catalina M.D.   On: 04/23/2016 18:19     Medications:     . atorvastatin  40 mg Oral QHS  . calcium acetate  667 mg Oral TID WC  . cefTRIAXone (ROCEPHIN)  IV  1 g Intravenous Q24H  . heparin  5,000 Units Subcutaneous Q8H  . insulin aspart  0-9 Units Subcutaneous TID WC  . patiromer  8.4 g Oral Daily     Assessment/ Plan:  60 y.o. Hispanic female with insulin dependent diabetes mellitus type II, diabetic retinopathy, hypertension, hyperlipidemia,  hemorrhagic cystitis, migraine headaches, osteoarthritis, osteoporosis who was admitted to [facility] on 04/15/2016   1. Acute Renal Failure on chronic kidney disease stage III with proteinuria: baseline creatinine of 1.79, eGFR of 30 from 09/20/2015. However there is concern of progression of disease.  - CKD is most likely due to poorly controlled DM and HTN in context of other complications of DM (neuropathy, Nephropathy) - serologies neg - Discussed with family the patient is approaching near ESRD. If serum creatinine does not improve by tomorrow, we will end up getting her started on dialysis. - permcath tomorrow   2. Hypertension: primarily isolated systolic suggesting severe atherosclerosis - variable control - Acceptable at present. - Currently on no agents  3. Diabetes Mellitus type II with chronic kidney disease: insulin  dependent. Hemoglobin A1c 7.2% - continue glucose control.   4. Hyperkalemia and acidosis - treat with veltassa  5. Urinalysis suggesting UTI - Currently on Rocephin Management as per hospitalist team   LOS: 1 Maggie Senseney 5/17/201710:36 AM

## 2016-04-24 NOTE — Care Management (Signed)
Patient is readmit. She went [facility] with [facility] last visit under charity. RNCM watch for dialysis need. Rolling walker delivered last visit. [facility] is aware of patient admission. RNCM will continue to follow. [facility] liaison notified.

## 2016-04-24 NOTE — Progress Notes (Signed)
[facility]  Patient Status: Active  [facility] is providing the following services: SN/PT/MSW  If patient discharges after hours, please call [phone].   Anita Contreras 04/24/2016, 10:23 AM

## 2016-04-24 NOTE — Progress Notes (Addendum)
[facility] NAME: Anita Contreras    MR#:  [id number]  DATE OF BIRTH:  18-Aug-1956  SUBJECTIVE:  CHIEF COMPLAINT:   Chief Complaint  Patient presents with  . Dizziness  Patient is a 60 year old Spanish female with past medical history significant for history of neuropathy, diabetes, vitamin D deficiency, hyperlipidemia, hypertension, CK D, who presents to the hospital with dizziness, weakness. On arrival to emergency room, she was noted to have improved blood pressure from a week ago admission to the hospital, however, patient was noted to be bradycardic. Patient's kidney function was also found to be markedly impaired, as compared to prior study, upper extremity 4 days ago. Patient was admitted to the hospital for further evaluation and treatment. Patient was evaluated via interpreter. Patient complained of back pains, fevers and chills, dysuria symptoms prior to coming to the hospital, now she is initiated on broad-spectrum antibiotic therapy and feels much better.  Review of Systems  Constitutional: Positive for fever, chills and malaise/fatigue. Negative for weight loss.  HENT: Negative for congestion.   Eyes: Negative for blurred vision and double vision.  Respiratory: Negative for cough, sputum production, shortness of breath and wheezing.   Cardiovascular: Negative for chest pain, palpitations, orthopnea, leg swelling and PND.  Gastrointestinal: Negative for nausea, vomiting, abdominal pain, diarrhea, constipation and blood in stool.  Genitourinary: Positive for dysuria and flank pain. Negative for urgency, frequency and hematuria.  Musculoskeletal: Negative for falls.  Neurological: Positive for weakness. Negative for dizziness, tremors, focal weakness and headaches.  Endo/Heme/Allergies: Does not bruise/bleed easily.  Psychiatric/Behavioral: Negative for depression. The patient does not have insomnia.     VITAL  SIGNS: Blood pressure 131/104, pulse 63, temperature 98 F (36.7 C), temperature source Oral, resp. rate 17, height 4\' 5"  (1.346 m), weight 57.153 kg (126 lb), last menstrual period 09/19/2001, SpO2 99 %.  PHYSICAL EXAMINATION:   GENERAL:  60 y.o.-year-old patient lying in the bed with no acute distress.  EYES: Pupils equal, round, reactive to light and accommodation. No scleral icterus. Extraocular muscles intact.  HEENT: Head atraumatic, normocephalic. Oropharynx and nasopharynx clear.  NECK:  Supple, no jugular venous distention. No thyroid enlargement, no tenderness.  LUNGS: Normal breath sounds bilaterally, no wheezing, rales,rhonchi or crepitation. No use of accessory muscles of respiration.  CARDIOVASCULAR: S1, S2 normal. No murmurs, rubs, or gallops.  ABDOMEN: Soft, nontender, nondistended. Bowel sounds present. No organomegaly or mass.  EXTREMITIES: No pedal edema, cyanosis, or clubbing. Mild CVS discomfort on percussion bilaterally NEUROLOGIC: Cranial nerves II through XII are intact. Muscle strength 5/5 in all extremities. Sensation intact. Gait not checked.  PSYCHIATRIC: The patient is somnolent, however, arousable and able to communicate, onset questions appropriately , oriented x 3.  SKIN: No obvious rash, lesion, or ulcer.   ORDERS/RESULTS REVIEWED:   CBC  Recent Labs Lab 04/23/16 1620 04/24/16 0350  WBC 7.5 6.9  HGB 9.5* 8.3*  HCT 28.9* 25.4*  PLT 295 249  MCV 85.5 85.5  MCH 28.2 27.9  MCHC 33.0 32.7  RDW 16.3* 16.0*   ------------------------------------------------------------------------------------------------------------------  Chemistries   Recent Labs Lab 04/18/16 0339 04/18/16 1519 04/23/16 1620 04/24/16 0350  NA 132*  --  128* 132*  K 5.6* 4.9 5.1 5.7*  CL 108  --  97* 104  CO2 18*  --  23 21*  GLUCOSE 108*  --  89 97  BUN 62*  --  71* 80*  CREATININE 3.63*  --  4.26* 4.33*  CALCIUM 7.8*  --  8.3* 7.7*    ------------------------------------------------------------------------------------------------------------------ estimated creatinine clearance is 8.8 mL/min (by C-G formula based on Cr of 4.33). ------------------------------------------------------------------------------------------------------------------ No results for input(s): TSH, T4TOTAL, T3FREE, THYROIDAB in the last 72 hours.  Invalid input(s): FREET3  Cardiac Enzymes No results for input(s): CKMB, TROPONINI, MYOGLOBIN in the last 168 hours.  Invalid input(s): CK ------------------------------------------------------------------------------------------------------------------ Invalid input(s): POCBNP ---------------------------------------------------------------------------------------------------------------  RADIOLOGY: Dg Chest 1 View  04/23/2016  CLINICAL DATA:  Dizziness and weakness EXAM: CHEST 1 VIEW COMPARISON:  None. FINDINGS: Cardiac shadow is enlarged. Bilateral pleural effusions and bibasilar atelectatic changes are noted. Mild vascular congestion is seen as well. No bony abnormality is noted. IMPRESSION: Changes of CHF with bibasilar effusions. Electronically Signed   By: Inez Catalina M.D.   On: 04/23/2016 18:19    EKG:  Orders placed or performed during the hospital encounter of 04/23/16  . ED EKG  . ED EKG    ASSESSMENT AND PLAN:  Principal Problem:   Acute on chronic renal failure (HCC) #1. Acute on chronic renal failure with known history of CAD stage III,  likely due to infection, ATN, now with fluid overload, getting nephrologist involved for further recommendations, appreciate nephrology input, obtaining evaluation by ENT nephrologist, patient's primary nephrology group. Nephrologist, discussed this family. The patient is approaching end-stage renal disease, since patient is fluid overloaded. She may need to have a hemodialysis initiated during this admission, if her kidney function does not improve.  Likely permacath tomorrow #2. Essential hypertension, blood pressure is well controlled, not on medications at present #3. Acute CHF, diastolic,Echocardiogram done 10th of May 2017 revealed ejection fraction of 50%, with mild aortic regurgitation, moderate mitral valve regurgitation, severe tricuspid valve regurgitation patient will likely need to have permacath placed tomorrow if  urine output does not increase and initiated on dialysis #4 urinary tract infection, questionable pyelonephritis, acute, urine culture is pending, continue Rocephin for now. Following labs, adjust and divided is appended on culture results #5. Dizziness, likely due to a bradycardia, relatively low blood pressure, patient developed orthostatic hypotension whenever she was started up, holding antihypertensive medications for now, she received IV fluids, which has stopped now due to CHF #6. Anemia of chronic disease, no active bleeding, recheck hemoglobin level tomorrow morning #7. Diabetes mellitus type 2, the patient is being continued on sliding scale insulin due to unpredictable oral intake.  #8 Hyperkalemia , now patient is on Veltassa, following potassium level later today and tomorrow in the morning Management plans discussed with the patient, family and they are in agreement.   DRUG ALLERGIES: No Known Allergies  CODE STATUS:     Code Status Orders        Start     Ordered   04/23/16 2030  Full code   Continuous     04/23/16 2029    Code Status History    Date Active Date Inactive Code Status Order ID Comments User Context   04/15/2016  7:18 PM 04/19/2016  4:08 PM Full Code [id number]  Gladstone Lighter, MD Inpatient      TOTAL Critical care TIME TAKING CARE OF THIS PATIENT 45 minutes.    Theodoro Grist M.D on 04/24/2016 at 5:59 PM  Between 7am to 6pm - Pager - [phone]  After 6pm go to [email or web address] - password [email or web address] [facility]  Office  [phone]  CC: Primary care  physician; WHITE, Arlie Solomons, FNP

## 2016-04-25 ENCOUNTER — Encounter
Admission: EM | Disposition: A | Discharge status: Home Health | Payer: Self-pay | Source: Home / Self Care | Attending: Internal Medicine

## 2016-04-25 HISTORY — PX: PERIPHERAL VASCULAR CATHETERIZATION: SHX172C

## 2016-04-25 LAB — RENAL FUNCTION PANEL
ALBUMIN: 2.8 g/dL — AB (ref 3.5–5.0)
ANION GAP: 7 (ref 5–15)
BUN: 73 mg/dL — ABNORMAL HIGH (ref 6–20)
CO2: 22 mmol/L (ref 22–32)
Calcium: 7.8 mg/dL — ABNORMAL LOW (ref 8.9–10.3)
Chloride: 107 mmol/L (ref 101–111)
Creatinine, Ser: 4.13 mg/dL — ABNORMAL HIGH (ref 0.44–1.00)
GFR calc Af Amer: 13 mL/min — ABNORMAL LOW (ref >60)
GFR, EST NON AFRICAN AMERICAN: 11 mL/min — AB (ref >60)
Glucose, Bld: 88 mg/dL (ref 65–99)
PHOSPHORUS: 5.3 mg/dL — AB (ref 2.5–4.6)
POTASSIUM: 5.6 mmol/L — AB (ref 3.5–5.1)
Sodium: 136 mmol/L (ref 135–145)

## 2016-04-25 LAB — GLUCOSE, CAPILLARY
GLUCOSE-CAPILLARY: 113 mg/dL — AB (ref 65–99)
GLUCOSE-CAPILLARY: 100 mg/dL — AB (ref 65–99)
GLUCOSE-CAPILLARY: 126 mg/dL — AB (ref 65–99)
Glucose-Capillary: 87 mg/dL (ref 65–99)

## 2016-04-25 LAB — MRSA PCR SCREENING: MRSA BY PCR: NEGATIVE

## 2016-04-25 LAB — HEPATITIS B SURFACE ANTIGEN: Hepatitis B Surface Ag: NEGATIVE

## 2016-04-25 LAB — HEPATITIS B SURFACE ANTIBODY, QUANTITATIVE: Hepatitis B-Post: <3.1 m[IU]/mL — ABNORMAL LOW (ref >9.9)

## 2016-04-25 SURGERY — DIALYSIS/PERMA CATHETER INSERTION
Anesthesia: Moderate Sedation

## 2016-04-25 MED ORDER — MIDAZOLAM HCL 2 MG/2ML IJ SOLN
INTRAMUSCULAR | Status: DC | PRN
  Administered 2016-04-25: 2 mg via INTRAVENOUS

## 2016-04-25 MED ORDER — POLYETHYLENE GLYCOL 3350 17 G PO PACK
17.0000 g | PACK | Freq: Every day | ORAL | Status: DC | PRN
  Filled 2016-04-25: qty 1

## 2016-04-25 MED ORDER — FENTANYL CITRATE (PF) 100 MCG/2ML IJ SOLN
INTRAMUSCULAR | Status: DC | PRN
  Administered 2016-04-25: 50 ug via INTRAVENOUS

## 2016-04-25 MED ORDER — MIDAZOLAM HCL 5 MG/5ML IJ SOLN
INTRAMUSCULAR | Status: AC
  Filled 2016-04-25: qty 5

## 2016-04-25 MED ORDER — HEPARIN SODIUM (PORCINE) 10000 UNIT/ML IJ SOLN
INTRAMUSCULAR | Status: AC
  Filled 2016-04-25: qty 1

## 2016-04-25 MED ORDER — LIDOCAINE-EPINEPHRINE (PF) 1 %-1:200000 IJ SOLN
INTRAMUSCULAR | Status: AC
  Filled 2016-04-25: qty 30

## 2016-04-25 MED ORDER — HYDRALAZINE HCL 25 MG PO TABS
25.0000 mg | ORAL_TABLET | Freq: Three times a day (TID) | ORAL | Status: DC
  Administered 2016-04-25 – 2016-05-02 (×21): 25 mg via ORAL
  Filled 2016-04-25 (×21): qty 1

## 2016-04-25 MED ORDER — HEPARIN SODIUM (PORCINE) 1000 UNIT/ML IJ SOLN
INTRAMUSCULAR | Status: AC
  Filled 2016-04-25: qty 1

## 2016-04-25 MED ORDER — OXYCODONE-ACETAMINOPHEN 5-325 MG PO TABS
1.0000 | ORAL_TABLET | Freq: Four times a day (QID) | ORAL | Status: DC | PRN
  Administered 2016-04-25 – 2016-05-05 (×5): 1 via ORAL
  Filled 2016-04-25 (×5): qty 1

## 2016-04-25 MED ORDER — FENTANYL CITRATE (PF) 100 MCG/2ML IJ SOLN
INTRAMUSCULAR | Status: AC
  Filled 2016-04-25: qty 2

## 2016-04-25 MED ORDER — HEPARIN (PORCINE) IN NACL 2-0.9 UNIT/ML-% IJ SOLN
INTRAMUSCULAR | Status: AC
  Filled 2016-04-25: qty 500

## 2016-04-25 SURGICAL SUPPLY — 3 items
CATH PALINDROME RT-P 15FX19CM (CATHETERS) ×3 IMPLANT
PACK ANGIOGRAPHY (CUSTOM PROCEDURE TRAY) ×3 IMPLANT
TOWEL OR 17X26 4PK STRL BLUE (TOWEL DISPOSABLE) ×3 IMPLANT

## 2016-04-25 NOTE — Progress Notes (Signed)
Pre HD  

## 2016-04-25 NOTE — Progress Notes (Signed)
Post HD  

## 2016-04-25 NOTE — Progress Notes (Signed)
Tx ended    

## 2016-04-25 NOTE — Progress Notes (Signed)
Subjective:   Conversation through [place] interpreter No acute complaints today.  When mentioned about dialysis, patient reports that she is voiding frequently now. Potassium remains high at 5.6 BUN high at 73, creatinine 4.13/GFR 11  Objective:  Vital signs in last 24 hours:  Temp:  [98 F (36.7 C)-98.1 F (36.7 C)] 98.1 F (36.7 C) (05/18 0420) Pulse Rate:  [63-66] 65 (05/18 0420) Resp:  [17] 17 (05/18 0420) BP: (130-172)/(55-104) 172/55 mmHg (05/18 0420) SpO2:  [93 %-99 %] 94 % (05/18 0420) Weight:  [57.153 kg (126 lb)] 57.153 kg (126 lb) (05/17 1431)  Weight change: 13.154 kg (29 lb) Filed Weights   04/23/16 1610 04/24/16 1431  Weight: 43.999 kg (97 lb) 57.153 kg (126 lb)    Intake/Output:    Intake/Output Summary (Last 24 hours) at 04/25/16 1140 Last data filed at 04/25/16 0939  Gross per 24 hour  Intake  524.4 ml  Output   1300 ml  Net -775.6 ml     Physical Exam: General: Laying in the bed, no acute distress  HEENT Anicteric, moist oral mucous membranes  Neck supple  Pulm/lungs Normal effort, room air, clear to auscultation  CVS/Heart No rub or gallop  Abdomen:  Soft, nontender, nondistended  Extremities: Trace peripheral edema  Neurologic: Alert, oriented  Skin: No acute rashes          Basic Metabolic Panel:   Recent Labs Lab 04/18/16 1519 04/23/16 1620 04/24/16 0350 04/24/16 1906 04/25/16 0357  NA  --  128* 132*  --  136  K 4.9 5.1 5.7* 5.5* 5.6*  CL  --  97* 104  --  107  CO2  --  23 21*  --  22  GLUCOSE  --  89 97  --  88  BUN  --  71* 80*  --  73*  CREATININE  --  4.26* 4.33*  --  4.13*  CALCIUM  --  8.3* 7.7*  --  7.8*  PHOS  --   --   --   --  5.3*     CBC:  Recent Labs Lab 04/23/16 1620 04/24/16 0350  WBC 7.5 6.9  HGB 9.5* 8.3*  HCT 28.9* 25.4*  MCV 85.5 85.5  PLT 295 249      Microbiology:  Recent Results (from the past 720 hour(s))  MRSA PCR Screening     Status: None   Collection Time: 04/25/16  6:45 AM   Result Value Ref Range Status   MRSA by PCR NEGATIVE NEGATIVE Final    Comment:        The GeneXpert MRSA Assay (FDA approved for NASAL specimens only), is one component of a comprehensive MRSA colonization surveillance program. It is not intended to diagnose MRSA infection nor to guide or monitor treatment for MRSA infections.     Coagulation Studies: No results for input(s): LABPROT, INR in the last 72 hours.  Urinalysis:  Recent Labs  04/23/16 2143  COLORURINE YELLOW*  LABSPEC 1.013  PHURINE 5.0  GLUCOSEU 50*  HGBUR NEGATIVE  BILIRUBINUR NEGATIVE  KETONESUR NEGATIVE  PROTEINUR >500*  NITRITE NEGATIVE  LEUKOCYTESUR 3+*      Imaging: Dg Chest 1 View  04/23/2016  CLINICAL DATA:  Dizziness and weakness EXAM: CHEST 1 VIEW COMPARISON:  None. FINDINGS: Cardiac shadow is enlarged. Bilateral pleural effusions and bibasilar atelectatic changes are noted. Mild vascular congestion is seen as well. No bony abnormality is noted. IMPRESSION: Changes of CHF with bibasilar effusions. Electronically Signed   By: Elta Guadeloupe  Lukens M.D.   On: 04/23/2016 18:19   US Renal  04/24/2016  CLINICAL DATA:  Renal failure, type II diabetes mellitus, chronic kidney disease, hyperlipidemia, hypertension EXAM: RENAL / URINARY TRACT ULTRASOUND COMPLETE COMPARISON:  CT abdomen and pelvis 04/15/2016 FINDINGS: Right Kidney: Length: 9.2 cm. Normal cortical thickness. Increased cortical echogenicity. Small amount of perinephric fluid. No mass, hydronephrosis or shadowing calcification. Left Kidney: Length: 10.1 cm. Normal cortical thickness. Increased cortical echogenicity. No mass, hydronephrosis or shadowing calcification. Bladder: Appears normal for degree of bladder distention. Incidentally noted small amount of ascites in RIGHT upper quadrant and a small LEFT pleural effusion. IMPRESSION: Medical renal disease changes of both kidneys. Small amount of perinephric fluid at RIGHT kidney, nonspecific and new  since 04/15/2016 CT exam. Electronically Signed   By: Lavonia Dana M.D.   On: 04/24/2016 18:31     Medications:   . sodium chloride 20 mL/hr at 04/25/16 0646   . atorvastatin  40 mg Oral QHS  . calcium acetate  667 mg Oral TID WC  . cefTRIAXone (ROCEPHIN)  IV  1 g Intravenous Q24H  . heparin  5,000 Units Subcutaneous Q8H  . insulin aspart  0-9 Units Subcutaneous TID WC  . patiromer  8.4 g Oral Daily     Assessment/ Plan:  60 y.o. Hispanic female with insulin dependent diabetes mellitus type II, diabetic retinopathy, hypertension, hyperlipidemia, hemorrhagic cystitis, migraine headaches, osteoarthritis, osteoporosis who was admitted to [facility] on 04/15/2016   1. ESRD:    - CKD is most likely due to poorly controlled DM and HTN in context of other complications of DM (neuropathy, Nephropathy) - serologies neg previous admission - Discussed with family regarding starting dialysis.  Procedure explained.  All questions answered to their satisfaction. - permcath today  2. Hypertension: primarily isolated systolic suggesting severe atherosclerosis - variable control - Acceptable at present. - Currently on no agents  3. Diabetes Mellitus type II with chronic kidney disease: insulin dependent. Hemoglobin A1c 7.2% - continue glucose control.   4. Hyperkalemia and acidosis - treat with veltassa, dialysis  5. Urinalysis suggesting UTI - Currently on Rocephin Management as per hospitalist team   LOS: 2 Dionne Knoop 5/18/201711:40 AM

## 2016-04-25 NOTE — Consult Note (Signed)
[facility] Consult Note  MRN : [id number]  Selva Nooner Macario Carls is a 60 y.o. (Oct 08, 1956) female who presents with chief complaint of  Chief Complaint  Patient presents with  . Dizziness  .  History of Present Illness: Patient presents to the hospital and is admitted 2 days ago for progressive dizziness and lethargy. She has been feeling out of sorts for several weeks and this seems to be gradually getting worse. She feels anxious and worried but is not having any current pain. She has no fevers or chills or signs of systemic infection. Her renal functions continued to decline from chronic kidney disease and she now is at the point that she is going to require dialysis. The nephrology service has contacted [place] and I am asked to see the patient by Dr. Candiss Norse for placement of dialysis access. The hope is to initiate dialysis today.  Current Facility-Administered Medications  Medication Dose Route Frequency Provider Last Rate Last Dose  . 0.9 %  sodium chloride infusion   Intravenous Continuous Algernon Huxley, MD 20 mL/hr at 04/25/16 [phone]    . [MAR Hold] atorvastatin (LIPITOR) tablet 40 mg  40 mg Oral QHS Vaughan Basta, MD   40 mg at 04/24/16 2220  . [MAR Hold] calcium acetate (PHOSLO) capsule 667 mg  667 mg Oral TID WC Vaughan Basta, MD   667 mg at 04/24/16 1726  . [MAR Hold] cefTRIAXone (ROCEPHIN) 1 g in dextrose 5 % 50 mL IVPB  1 g Intravenous Q24H Theodoro Grist, MD   1 g at 04/25/16 1103  . [MAR Hold] heparin injection 5,000 Units  5,000 Units Subcutaneous Q8H Vaughan Basta, MD   5,000 Units at 04/25/16 [phone]  . [MAR Hold] insulin aspart (novoLOG) injection 0-9 Units  0-9 Units Subcutaneous TID WC Vaughan Basta, MD   1 Units at 04/24/16 1726    Past Medical History  Diagnosis Date  . Acute cystitis with hematuria   . Neuropathy ([place])   . Migraine variant   . Cataracts, bilateral   . Diabetic retinopathy, background ([place])    . Dizziness   . Vitamin D deficiency   . Personal history of noncompliance with medical treatment, presenting hazards to health   . Type II diabetes mellitus with ophthalmic manifestations, uncontrolled ([place])   . Hyperlipidemia   . OP (osteoporosis)   . Hypertension   . CKD (chronic kidney disease)     History reviewed. No pertinent past surgical history.  Social History Social History  Substance Use Topics  . Smoking status: Never Smoker   . Smokeless tobacco: None  . Alcohol Use: No  No IV drug use  Family History Family History  Problem Relation Age of Onset  . Hypertension Mother   . Hyperlipidemia Mother   No family history of bleeding disorders, clotting disorders, or autoimmune diseases  No Known Allergies   REVIEW OF SYSTEMS (Negative unless checked)  Constitutional: [] Weight loss  [] Fever  [] Chills Cardiac: [] Chest pain   [] Chest pressure   [] Palpitations   [] Shortness of breath when laying flat   [] Shortness of breath at rest   [x] Shortness of breath with exertion. Vascular:  [] Pain in legs with walking   [] Pain in legs at rest   [] Pain in legs when laying flat   [] Claudication   [] Pain in feet when walking  [] Pain in feet at rest  [] Pain in feet when laying flat   [] History of DVT   [] Phlebitis   [] Swelling in legs   []   Varicose veins   [] Non-healing ulcers Pulmonary:   [] Uses home oxygen   [] Productive cough   [] Hemoptysis   [] Wheeze  [] COPD   [] Asthma Neurologic:  [x] Dizziness  [] Blackouts   [] Seizures   [] History of stroke   [] History of TIA  [] Aphasia   [] Temporary blindness   [] Dysphagia   [] Weakness or numbness in arms   [] Weakness or numbness in legs Musculoskeletal:  [] Arthritis   [] Joint swelling   [] Joint pain   [] Low back pain Hematologic:  [] Easy bruising  [] Easy bleeding   [] Hypercoagulable state   [] Anemic  [] Hepatitis Gastrointestinal:  [] Blood in stool   [] Vomiting blood  [] Gastroesophageal reflux/heartburn   [] Difficulty swallowing. Genitourinary:   [x] Chronic kidney disease   [] Difficult urination  [] Frequent urination  [] Burning with urination   [x] Blood in urine Skin:  [] Rashes   [] Ulcers   [] Wounds Psychological:  [] History of anxiety   []  History of major depression.  Physical Examination  Filed Vitals:   04/24/16 1349 04/24/16 1431 04/24/16 2059 04/25/16 0420  BP: 131/104 130/88 165/60 172/55  Pulse: 63  66 65  Temp: 98 F (36.7 C)  98.1 F (36.7 C) 98.1 F (36.7 C)  TempSrc: Oral  Oral Oral  Resp: 17  17 17   Height:  4\' 5"  (1.346 m)    Weight:  57.153 kg (126 lb)    SpO2: 99%  93% 94%   Body mass index is 31.55 kg/(m^2). Gen:  WD/WN, NAD Head: [place]/AT, No temporalis wasting. Prominent temp pulse not noted. Ear/Nose/Throat: Hearing grossly intact, nares w/o erythema or drainage, oropharynx w/o Erythema/Exudate Eyes: PERRLA, EOMI.  Neck: Supple, no nuchal rigidity.  No JVD.  Pulmonary:  Good air movement, Equal bilaterally.  Cardiac: RRR, normal S1, S2 Vascular:  Vessel Right Left  Radial Palpable Palpable                                   Gastrointestinal: soft, non-tender/non-distended. No guarding/reflex. No masses, surgical incisions, or scars. Musculoskeletal: M/S 5/5 throughout.  Extremities without ischemic changes.  No deformity or atrophy. Mild lower extremity edema. Neurologic: CN 2-12 intact. Pain and light touch intact in extremities.  Symmetrical.  Speech is fluent. Motor exam as listed above. Psychiatric: Judgment intact, Mood & affect appropriate for pt's clinical situation. Dermatologic: No rashes or ulcers noted.  No cellulitis or open wounds. Lymph : No Cervical, Axillary, or Inguinal lymphadenopathy.     CBC Lab Results  Component Value Date   WBC 6.9 04/24/2016   HGB 8.3* 04/24/2016   HCT 25.4* 04/24/2016   MCV 85.5 04/24/2016   PLT 249 04/24/2016    BMET    Component Value Date/Time   NA 136 04/25/2016 0357   K 5.6* 04/25/2016 0357   CL 107 04/25/2016 0357   CO2 22  04/25/2016 0357   GLUCOSE 88 04/25/2016 0357   BUN 73* 04/25/2016 0357   CREATININE 4.13* 04/25/2016 0357   CALCIUM 7.8* 04/25/2016 0357   GFRNONAA 11* 04/25/2016 0357   GFRAA 13* 04/25/2016 0357   Estimated Creatinine Clearance: 9.3 mL/min (by C-G formula based on Cr of 4.13).  COAG No results found for: INR, PROTIME  Radiology Ct Abdomen Pelvis Wo Contrast  04/15/2016  CLINICAL DATA:  Right lower quadrant pain for 3 months EXAM: CT ABDOMEN AND PELVIS WITHOUT CONTRAST TECHNIQUE: Multidetector CT imaging of the abdomen and pelvis was performed following the standard protocol without IV contrast. COMPARISON:  None. FINDINGS: Lower chest:  Small pleural effusions are identified bilaterally. Hepatobiliary: No suspicious liver abnormalities identified. The gallbladder appears normal. There is no biliary dilatation. Pancreas: No mass or inflammatory process identified on this un-enhanced exam. Spleen: Within normal limits in size. Adrenals/Urinary Tract: No evidence of urolithiasis or hydronephrosis. No definite mass visualized on this un-enhanced exam. Stomach/Bowel: The stomach is within normal limits. The small bowel loops have a normal course and caliber. No obstruction. Normal appearance of the colon. The appendix is visualized and appears normal. Vascular/Lymphatic: Calcified atherosclerotic disease involves the abdominal aorta. No aneurysm. No enlarged retroperitoneal or mesenteric adenopathy. No enlarged pelvic or inguinal lymph nodes. Reproductive: No mass or other significant abnormality. Other: There is no ascites or focal fluid collections within the abdomen or pelvis. There is a umbilical hernia which contains fat only. Musculoskeletal: Degenerative disc disease is noted within the lower lumbar spine at the L5-S1 level. IMPRESSION: 1. No acute findings identified within the abdomen or pelvis. 2. Aortic atherosclerosis 3. Small umbilical hernia contains fat only. 4. Small bilateral pleural  effusions. Electronically Signed   By: Kerby Moors M.D.   On: 04/15/2016 14:32   Dg Chest 1 View  04/23/2016  CLINICAL DATA:  Dizziness and weakness EXAM: CHEST 1 VIEW COMPARISON:  None. FINDINGS: Cardiac shadow is enlarged. Bilateral pleural effusions and bibasilar atelectatic changes are noted. Mild vascular congestion is seen as well. No bony abnormality is noted. IMPRESSION: Changes of CHF with bibasilar effusions. Electronically Signed   By: Inez Catalina M.D.   On: 04/23/2016 18:19   Ct Head Wo Contrast  04/18/2016  CLINICAL DATA:  60 year old female with recent vertigo. Found have mild cerebellar edema associated with a punctate age indeterminate cerebellar hemorrhage by MRI. Initial encounter. EXAM: CT HEAD WITHOUT CONTRAST TECHNIQUE: Contiguous axial images were obtained from the base of the skull through the vertex without intravenous contrast. COMPARISON:  Brain MRI 04/17/2016. FINDINGS: Visualized paranasal sinuses and mastoids are clear. No acute osseous abnormality identified. Visualized orbits and scalp soft tissues are within normal limits. Cerebral volume is within normal limits for age. No intracranial mass effect. The area of abnormality in the inferior right cerebellum is evident as mild hypodensity on CT (series 2, image 7) without associated visible blood products by CT. No associated mass effect. Cerebellar and brainstem gray-white matter differentiation otherwise is normal. Normal supratentorial gray-white matter differentiation. No new cortically based infarct. No hyperdense intracranial hemorrhage identified. IMPRESSION: Recently seen right cerebellar abnormality is visible on CT as a small area of mild hypodensity. No macroscopic hemorrhage or other acute intracranial abnormality evident by CT. Electronically Signed   By: Genevie Ann M.D.   On: 04/18/2016 11:32   [name]  04/17/2016  CLINICAL DATA:  Vertigo.  Hypertensive urgency and renal failure. EXAM: MRI HEAD  WITHOUT CONTRAST TECHNIQUE: Multiplanar, multiecho pulse sequences of the brain and surrounding structures were obtained without intravenous contrast. COMPARISON:  None. FINDINGS: There is mild motion artifact throughout. There is no evidence of acute infarct, midline shift, or extra-axial fluid collection. Slight diffuse sulcal prominence is compatible with mild cerebral atrophy. Ventricles are normal in size. No significant cerebral white matter disease is seen. There is a 3 mm focus of susceptibility artifact in the inferior right cerebellum with mild surrounding T2 hyperintensity/edema. There is no evidence of intracranial hemorrhage elsewhere. Orbits are unremarkable. There is mild left maxillary sinus mucosal thickening. Mastoid air cells are clear. Major intracranial vascular flow voids are preserved. IMPRESSION:  1. 3 mm focus of blood products in the right cerebellum. This is of uncertain age by MRI, however a small acute/subacute hemorrhage is a concern given evidence of mild surrounding edema and patient's current vertigo. This may be hypertensive in etiology or possibly due to an underlying cavernoma. Noncontrast head CT is recommended. 2. No other evidence of acute intracranial abnormality. These results will be called to the ordering clinician or representative by the Radiologist Assistant, and communication documented in the PACS or zVision Dashboard. Electronically Signed   By: Logan Bores M.D.   On: 04/17/2016 12:12   US Pelvis Complete  04/16/2016  CLINICAL DATA:  Two days of vaginal bleeding ; the patient was unable to void for the endovaginal portion of the study. EXAM: TRANSABDOMINAL ULTRASOUND OF PELVIS TECHNIQUE: Transabdominal ultrasound examination of the pelvis was performed including evaluation of the uterus, ovaries, adnexal regions, and pelvic cul-de-sac. COMPARISON:  Abdominal and pelvic CT scan of Apr 15, 2016. FINDINGS: Uterus Measurements: 5.6 x 2.9 x 4.6 cm. No fibroids or other  mass visualized. Endometrium Thickness: 2.0 mm.  No focal abnormality visualized. Right ovary Measurements: 2.3 x 1.1 x 1.8 cm. Normal appearance/no adnexal mass. Left ovary Measurements: 1.7 x 1.0 x 1.4 cm. Normal appearance/no adnexal mass. Other findings:  No abnormal free fluid. IMPRESSION: Normal appearance of the uterus, the endometrium, and both ovaries. No endometrial masses or other abnormalities are observed. Electronically Signed   By: David  Martinique M.D.   On: 04/16/2016 10:53   US Renal  04/24/2016  CLINICAL DATA:  Renal failure, type II diabetes mellitus, chronic kidney disease, hyperlipidemia, hypertension EXAM: RENAL / URINARY TRACT ULTRASOUND COMPLETE COMPARISON:  CT abdomen and pelvis 04/15/2016 FINDINGS: Right Kidney: Length: 9.2 cm. Normal cortical thickness. Increased cortical echogenicity. Small amount of perinephric fluid. No mass, hydronephrosis or shadowing calcification. Left Kidney: Length: 10.1 cm. Normal cortical thickness. Increased cortical echogenicity. No mass, hydronephrosis or shadowing calcification. Bladder: Appears normal for degree of bladder distention. Incidentally noted small amount of ascites in RIGHT upper quadrant and a small LEFT pleural effusion. IMPRESSION: Medical renal disease changes of both kidneys. Small amount of perinephric fluid at RIGHT kidney, nonspecific and new since 04/15/2016 CT exam. Electronically Signed   By: Lavonia Dana M.D.   On: 04/24/2016 18:31   US Carotid Bilateral  04/17/2016  CLINICAL DATA:  Intracranial hemorrhage EXAM: BILATERAL CAROTID DUPLEX ULTRASOUND TECHNIQUE: Pearline Cables scale imaging, color Doppler and duplex ultrasound was performed of bilateral carotid and vertebral arteries in the neck. COMPARISON:  None. REVIEW OF SYSTEMS: Quantification of carotid stenosis is based on velocity parameters that correlate the residual internal carotid diameter with NASCET-based stenosis levels, using the diameter of the distal internal carotid lumen as  the denominator for stenosis measurement. The following velocity measurements were obtained: PEAK SYSTOLIC/END DIASTOLIC RIGHT ICA:                     128/45cm/sec CCA:                     [id number] SYSTOLIC ICA/CCA RATIO:  [id number] DIASTOLIC ICA/CCA RATIO: [id number] ECA:                     166cm/sec LEFT ICA:                     165/46cm/sec CCA:  [id number] SYSTOLIC ICA/CCA RATIO:  [id number] DIASTOLIC ICA/CCA RATIO: [id number] ECA:                     177cm/sec FINDINGS: RIGHT CAROTID ARTERY: Mild eccentric partially calcified plaque in the bulb and ICA origin. No high-grade stenosis. Normal waveforms and color Doppler signal. RIGHT VERTEBRAL ARTERY:  Normal flow direction and waveform. LEFT CAROTID ARTERY: Calcified plaque in the carotid bulb extending to involve proximal internal and external carotid arteries. No high-grade ICA stenosis. Normal waveforms and color Doppler signal. LEFT VERTEBRAL ARTERY: Normal flow direction and waveform. IMPRESSION: 1. Mild bilateral carotid bifurcation and proximal ICA plaque, resulting in less than 50% diameter stenosis. The exam does not exclude plaque ulceration or embolization. Continued surveillance recommended. 2.  Antegrade bilateral vertebral arterial flow. Electronically Signed   By: Lucrezia Europe M.D.   On: 04/17/2016 15:08   US Aorta  04/15/2016  CLINICAL DATA:  Abdominal pain.  Evaluate for aortic dissection. EXAM: ULTRASOUND OF ABDOMINAL AORTA TECHNIQUE: Ultrasound examination of the abdominal aorta was performed to evaluate for abdominal aortic aneurysm. COMPARISON:  Noncontrast abdominal CT 04/15/2016 FINDINGS: Abdominal Aorta No abdominal aortic aneurysm. The distal abdominal aorta measures up to 1.3 cm with atherosclerotic disease. Mid abdominal aorta measures up to 1.4 cm. Maximum size of the proximal abdominal aorta is 1.8 cm. This modality is not optimal for evaluation for an aortic dissection. However, a dissection is thought to be unlikely based on the  size of this vessel and there is no clear evidence for a false channel. Iliac arteries: Right common iliac artery measures up to 0.8 cm with atherosclerotic calcifications. The right common iliac artery is patent. Left common iliac artery measures up to the 0.7 cm and patent. IMPRESSION: Negative for an abdominal aortic aneurysm. The abdominal aorta is small with atherosclerotic disease. Limited evaluation for an aortic dissection based on the modality and size of the aorta. An aortic dissection is thought to be unlikely based on the color Doppler images and size of the aorta. However, noncontrast MR would be more definitive evaluation for an aortic dissection if the patient cannot receive intravenous contrast. Electronically Signed   By: Markus Daft M.D.   On: 04/15/2016 16:01     Assessment/Plan 1. Renal failure. Now progressed end-stage renal disease and needs to initiate dialysis. PermCath has been requested by the nephrology service and we will place this today. Risks and benefits of the procedure were discussed with the patient and she is agreeable to proceed. Outpatient workup for AV fistula creation was also discussed. 2. Diabetes. An underlying cause of her renal failure. Tight glucose control important in reducing progression of atherosclerosis. 3. Hypertension. Likely an underlying cause of her renal failure. Better control important in reducing progression of atherosclerosis.   DEW,JASON, MD  04/25/2016 12:55 PM

## 2016-04-25 NOTE — Op Note (Signed)
OPERATIVE NOTE    PRE-OPERATIVE DIAGNOSIS: 1. ESRD   POST-OPERATIVE DIAGNOSIS: same as above  PROCEDURE: 1. Ultrasound guidance for vascular access to the right internal jugular vein 2. Fluoroscopic guidance for placement of catheter 3. Placement of a 19 cm tip to cuff tunneled hemodialysis catheter via the right internal jugular vein  SURGEON: [name], MD  ANESTHESIA:  Local with Moderate conscious sedation for approximately 20 minutes using 2 mg of Versed and 50 mcg of Fentanyl  ESTIMATED BLOOD LOSS: 50 cc  FLUORO TIME: 0.4 minutes  CONTRAST: 0 cc  FINDING(S): 1.  Patent right internal jugular vein  SPECIMEN(S):  None  INDICATIONS:   Anita Contreras is a 60 y.o. female who presents with progressive renal failure now felt to have end-stage renal disease and needs to start dialysis.  The patient needs long term dialysis access for their ESRD, and a Permcath is necessary.  Risks and benefits are discussed and informed consent is obtained.    DESCRIPTION: After obtaining full informed written consent, the patient was brought back to the vascular suited. The patient's right neck and chest were sterilely prepped and draped in a sterile surgical field was created. Moderate conscious sedation was administered during a face to face encounter with the patient throughout the procedure with my supervision of the RN administering medicines and monitoring the patient's vital signs, pulse oximetry, telemetry and mental status throughout from the start of the procedure until the patient was taken to the recovery room.  The right internal jugular vein was visualized with ultrasound and found to be patent. It was then accessed under direct ultrasound guidance and a permanent image was recorded. A wire was placed. After skin nick and dilatation, the peel-away sheath was placed over the wire. I then turned my attention to an area under the clavicle. Approximately 1-2 fingerbreadths below  the clavicle a small counterincision was created and tunneled from the subclavicular incision to the access site. Using fluoroscopic guidance, a 19 centimeter tip to cuff tunneled hemodialysis catheter was selected, and tunneled from the subclavicular incision to the access site. It was then placed through the peel-away sheath and the peel-away sheath was removed. Using fluoroscopic guidance the catheter tips were parked in the right atrium. The appropriate distal connectors were placed. It withdrew blood well and flushed easily with heparinized saline and a concentrated heparin solution was then placed. It was secured to the chest wall with 2 Prolene sutures. The access incision was closed single 4-0 Monocryl. A 4-0 Monocryl pursestring suture was placed around the exit site. Sterile dressings were placed. The patient tolerated the procedure well and was taken to the recovery room in stable condition.  COMPLICATIONS: None  CONDITION: Stable  DEW,JASON  04/25/2016, 1:21 PM

## 2016-04-25 NOTE — Care Management Note (Signed)
Referral has been sent to [facility] today.  It may take a few extra days to complete placement since patient has no insurance.   [facility] Coordinator  [phone]

## 2016-04-25 NOTE — Progress Notes (Signed)
Tx started 

## 2016-04-25 NOTE — Progress Notes (Signed)
PRE HD   

## 2016-04-26 ENCOUNTER — Encounter: Payer: Self-pay | Admitting: Vascular Surgery

## 2016-04-26 LAB — GLUCOSE, CAPILLARY
GLUCOSE-CAPILLARY: 101 mg/dL — AB (ref 65–99)
GLUCOSE-CAPILLARY: 126 mg/dL — AB (ref 65–99)
GLUCOSE-CAPILLARY: 136 mg/dL — AB (ref 65–99)
Glucose-Capillary: 114 mg/dL — ABNORMAL HIGH (ref 65–99)

## 2016-04-26 LAB — BASIC METABOLIC PANEL
Anion gap: 8 (ref 5–15)
BUN: 40 mg/dL — AB (ref 6–20)
CHLORIDE: 104 mmol/L (ref 101–111)
CO2: 27 mmol/L (ref 22–32)
Calcium: 8.1 mg/dL — ABNORMAL LOW (ref 8.9–10.3)
Creatinine, Ser: 2.98 mg/dL — ABNORMAL HIGH (ref 0.44–1.00)
GFR calc Af Amer: 19 mL/min — ABNORMAL LOW (ref >60)
GFR calc non Af Amer: 16 mL/min — ABNORMAL LOW (ref >60)
Glucose, Bld: 129 mg/dL — ABNORMAL HIGH (ref 65–99)
POTASSIUM: 4.5 mmol/L (ref 3.5–5.1)
SODIUM: 139 mmol/L (ref 135–145)

## 2016-04-26 MED ORDER — ONDANSETRON HCL 4 MG/2ML IJ SOLN
4.0000 mg | INTRAMUSCULAR | Status: DC | PRN
Start: 2016-04-26 — End: 2016-05-07
  Administered 2016-04-26 – 2016-04-29 (×5): 4 mg via INTRAVENOUS
  Filled 2016-04-26 (×5): qty 2

## 2016-04-26 MED ORDER — AMLODIPINE BESYLATE 10 MG PO TABS
10.0000 mg | ORAL_TABLET | Freq: Every day | ORAL | Status: DC
  Administered 2016-04-26 – 2016-05-06 (×9): 10 mg via ORAL
  Filled 2016-04-26 (×9): qty 1

## 2016-04-26 MED ORDER — ONDANSETRON HCL 4 MG/2ML IJ SOLN: 4.0000 mg | INTRAMUSCULAR | Status: DC

## 2016-04-26 MED ORDER — POLYETHYLENE GLYCOL 3350 17 G PO PACK
17.0000 g | PACK | Freq: Once | ORAL | Status: AC
  Administered 2016-04-26: 17 g via ORAL
  Filled 2016-04-26: qty 1

## 2016-04-26 MED ORDER — ACETAMINOPHEN 325 MG PO TABS: 650.0000 mg | ORAL_TABLET | Freq: Four times a day (QID) | ORAL | Status: DC | PRN

## 2016-04-26 MED ORDER — DOCUSATE SODIUM 100 MG PO CAPS
100.0000 mg | ORAL_CAPSULE | Freq: Two times a day (BID) | ORAL | Status: DC
  Administered 2016-04-26 – 2016-05-01 (×9): 100 mg via ORAL
  Filled 2016-04-26 (×9): qty 1

## 2016-04-26 NOTE — Progress Notes (Signed)
Hemodialysis completed. 

## 2016-04-26 NOTE — Progress Notes (Signed)
Post hd tx 

## 2016-04-26 NOTE — Progress Notes (Signed)
Hemodialysis start 

## 2016-04-26 NOTE — Progress Notes (Signed)
Pre-hd tx 

## 2016-04-26 NOTE — Progress Notes (Signed)
Subjective:   No acute c/o Patient seen during dialysis Tolerating well    HEMODIALYSIS FLOWSHEET:  Blood Flow Rate (mL/min): 250 mL/min Arterial Pressure (mmHg): -60 mmHg Venous Pressure (mmHg): 70 mmHg Transmembrane Pressure (mmHg): 50 mmHg Ultrafiltration Rate (mL/min): 800 mL/min Dialysate Flow Rate (mL/min): 500 ml/min Conductivity: Machine : 14 Conductivity: Machine : 14 Dialysis Fluid Bolus: Normal Saline Bolus Amount (mL): 250 mL Intra-Hemodialysis Comments: Tx started without complciations.Access secured/visible.Dialyzing in bed.    Objective:  Vital signs in last 24 hours:  Temp:  [97.5 F (36.4 C)-98.4 F (36.9 C)] 97.9 F (36.6 C) (05/19 1015) Pulse Rate:  [64-82] 82 (05/19 1030) Resp:  [11-20] 13 (05/19 1030) BP: (123-174)/(49-76) 174/67 mmHg (05/19 1030) SpO2:  [86 %-100 %] 100 % (05/19 1030) Weight:  [53.5 kg (117 lb 15.1 oz)-55.8 kg (123 lb 0.3 oz)] 53.5 kg (117 lb 15.1 oz) (05/19 1015)  Weight change: -1.353 kg (-2 lb 15.7 oz) Filed Weights   04/24/16 1431 04/25/16 1418 04/26/16 1015  Weight: 57.153 kg (126 lb) 55.8 kg (123 lb 0.3 oz) 53.5 kg (117 lb 15.1 oz)    Intake/Output:    Intake/Output Summary (Last 24 hours) at 04/26/16 1053 Last data filed at 04/26/16 1013  Gross per 24 hour  Intake    480 ml  Output   1330 ml  Net   -850 ml     Physical Exam: General: Laying in the bed, no acute distress  HEENT Anicteric, moist oral mucous membranes  Neck supple  Pulm/lungs Normal effort, room air, clear to auscultation  CVS/Heart No rub or gallop  Abdomen:  Soft, nontender, nondistended  Extremities: Trace peripheral edema  Neurologic: Alert, oriented  Skin: No acute rashes   Rt IJ PC/Dr Dew / 5/18       Basic Metabolic Panel:   Recent Labs Lab 04/23/16 1620 04/24/16 0350 04/24/16 1906 04/25/16 0357  NA 128* 132*  --  136  K 5.1 5.7* 5.5* 5.6*  CL 97* 104  --  107  CO2 23 21*  --  22  GLUCOSE 89 97  --  88  BUN 71* 80*  --   73*  CREATININE 4.26* 4.33*  --  4.13*  CALCIUM 8.3* 7.7*  --  7.8*  PHOS  --   --   --  5.3*     CBC:  Recent Labs Lab 04/23/16 1620 04/24/16 0350  WBC 7.5 6.9  HGB 9.5* 8.3*  HCT 28.9* 25.4*  MCV 85.5 85.5  PLT 295 249      Microbiology:  Recent Results (from the past 720 hour(s))  Urine culture     Status: None (Preliminary result)   Collection Time: 04/23/16  9:43 PM  Result Value Ref Range Status   Specimen Description URINE, CATHETERIZED  Final   Special Requests NONE  Final   Culture   Final    TOO YOUNG TO READ Performed at [facility]    Report Status PENDING  Incomplete  MRSA PCR Screening     Status: None   Collection Time: 04/25/16  6:45 AM  Result Value Ref Range Status   MRSA by PCR NEGATIVE NEGATIVE Final    Comment:        The GeneXpert MRSA Assay (FDA approved for NASAL specimens only), is one component of a comprehensive MRSA colonization surveillance program. It is not intended to diagnose MRSA infection nor to guide or monitor treatment for MRSA infections.     Coagulation Studies: No results  for input(s): LABPROT, INR in the last 72 hours.  Urinalysis:  Recent Labs  04/23/16 2143  COLORURINE YELLOW*  LABSPEC 1.013  PHURINE 5.0  GLUCOSEU 50*  HGBUR NEGATIVE  BILIRUBINUR NEGATIVE  KETONESUR NEGATIVE  PROTEINUR >500*  NITRITE NEGATIVE  LEUKOCYTESUR 3+*      Imaging: US Renal  04/24/2016  CLINICAL DATA:  Renal failure, type II diabetes mellitus, chronic kidney disease, hyperlipidemia, hypertension EXAM: RENAL / URINARY TRACT ULTRASOUND COMPLETE COMPARISON:  CT abdomen and pelvis 04/15/2016 FINDINGS: Right Kidney: Length: 9.2 cm. Normal cortical thickness. Increased cortical echogenicity. Small amount of perinephric fluid. No mass, hydronephrosis or shadowing calcification. Left Kidney: Length: 10.1 cm. Normal cortical thickness. Increased cortical echogenicity. No mass, hydronephrosis or shadowing calcification.  Bladder: Appears normal for degree of bladder distention. Incidentally noted small amount of ascites in RIGHT upper quadrant and a small LEFT pleural effusion. IMPRESSION: Medical renal disease changes of both kidneys. Small amount of perinephric fluid at RIGHT kidney, nonspecific and new since 04/15/2016 CT exam. Electronically Signed   By: Lavonia Dana M.D.   On: 04/24/2016 18:31     Medications:     . atorvastatin  40 mg Oral QHS  . calcium acetate  667 mg Oral TID WC  . cefTRIAXone (ROCEPHIN)  IV  1 g Intravenous Q24H  . heparin  5,000 Units Subcutaneous Q8H  . hydrALAZINE  25 mg Oral Q8H  . insulin aspart  0-9 Units Subcutaneous TID WC     Assessment/ Plan:  60 y.o. Hispanic female with insulin dependent diabetes mellitus type II, diabetic retinopathy, hypertension, hyperlipidemia, hemorrhagic cystitis, migraine headaches, osteoarthritis, osteoporosis who was admitted to [facility] on 04/15/2016   1. ESRD:    - CKD is most likely due to poorly controlled DM and HTN in context of other complications of DM (neuropathy, Nephropathy) - serologies neg previous admission - permcath placed 5/18 and 1st HD 5/18 Patient seen during dialysis Tolerating well  Next HD on Saturday D/c planning for [facility] or [facility]  2. Hypertension: primarily isolated systolic suggesting severe atherosclerosis - variable control - Acceptable at present. - Currently on no agents  3. Diabetes Mellitus type II with chronic kidney disease: insulin dependent. Hemoglobin A1c 7.2% - continue glucose control.   4. Hyperkalemia and acidosis - improved  5. Urinalysis suggesting UTI - Currently on Rocephin - Management as per hospitalist team   LOS: 3 Anita Contreras 5/19/201710:53 AM

## 2016-04-26 NOTE — Progress Notes (Signed)
[facility] at [place] NAME: Anita Contreras    MR#:  [id number]  DATE OF BIRTH:  18-Apr-1956  SUBJECTIVE:  CHIEF COMPLAINT:   Chief Complaint  Patient presents with  . Dizziness  Patient is a 60 year old Spanish female with past medical history significant for history of neuropathy, diabetes, vitamin D deficiency, hyperlipidemia, hypertension, CK D, who presents to the hospital with dizziness, weakness. On arrival to emergency room, she was noted to have improved blood pressure from a week ago admission to the hospital, however, patient was noted to be bradycardic. Patient's kidney function was also found to be markedly impaired, as compared to prior study, upper extremity 4 days ago. Patient was admitted to the hospital for further evaluation and treatment. Patient was evaluated via interpreter. Patient complained of back pains, fevers and chills, dysuria symptoms prior to coming to the hospital, now she is initiated on broad-spectrum antibiotic therapy and feels much better.  renal func did not improve and needed to have HD,  permacath placed 5/18- started on HD.  Review of Systems  Constitutional: Positive for fever, chills and malaise/fatigue. Negative for weight loss.  HENT: Negative for congestion.   Eyes: Negative for blurred vision and double vision.  Respiratory: Negative for cough, sputum production, shortness of breath and wheezing.   Cardiovascular: Negative for chest pain, palpitations, orthopnea, leg swelling and PND.  Gastrointestinal: Negative for nausea, vomiting, abdominal pain, diarrhea, constipation and blood in stool.  Genitourinary: Positive for dysuria and flank pain. Negative for urgency, frequency and hematuria.  Musculoskeletal: Negative for falls.  Neurological: Positive for weakness. Negative for dizziness, tremors, focal weakness and headaches.  Endo/Heme/Allergies: Does not bruise/bleed easily.   Psychiatric/Behavioral: Negative for depression. The patient does not have insomnia.     VITAL SIGNS: Blood pressure 177/68, pulse 85, temperature 97.9 F (36.6 C), temperature source Oral, resp. rate 18, height 4\' 5"  (1.346 m), weight 52 kg (114 lb 10.2 oz), last menstrual period 09/19/2001, SpO2 98 %.  PHYSICAL EXAMINATION:   GENERAL:  60 y.o.-year-old patient lying in the bed with no acute distress.  EYES: Pupils equal, round, reactive to light and accommodation. No scleral icterus. Extraocular muscles intact.  HEENT: Head atraumatic, normocephalic. Oropharynx and nasopharynx clear.  NECK:  Supple, no jugular venous distention. No thyroid enlargement, no tenderness.  LUNGS: Normal breath sounds bilaterally, no wheezing, rales,rhonchi or crepitation. No use of accessory muscles of respiration.  CARDIOVASCULAR: S1, S2 normal. No murmurs, rubs, or gallops.  ABDOMEN: Soft, nontender, nondistended. Bowel sounds present. No organomegaly or mass.  EXTREMITIES: No pedal edema, cyanosis, or clubbing. Mild CVS discomfort on percussion bilaterally NEUROLOGIC: Cranial nerves II through XII are intact. Muscle strength 5/5 in all extremities. Sensation intact. Gait not checked.  PSYCHIATRIC: The patient is alert and oriented x 3.  SKIN: No obvious rash, lesion, or ulcer.   ORDERS/RESULTS REVIEWED:   CBC  Recent Labs Lab 04/23/16 1620 04/24/16 0350  WBC 7.5 6.9  HGB 9.5* 8.3*  HCT 28.9* 25.4*  PLT 295 249  MCV 85.5 85.5  MCH 28.2 27.9  MCHC 33.0 32.7  RDW 16.3* 16.0*   ------------------------------------------------------------------------------------------------------------------  Chemistries   Recent Labs Lab 04/23/16 1620 04/24/16 0350 04/24/16 1906 04/25/16 0357 04/26/16 0934  NA 128* 132*  --  136 139  K 5.1 5.7* 5.5* 5.6* 4.5  CL 97* 104  --  107 104  CO2 23 21*  --  22 27  GLUCOSE 89 97  --  88 129*  BUN 71* 80*  --  73* 40*  CREATININE 4.26* 4.33*  --  4.13* 2.98*   CALCIUM 8.3* 7.7*  --  7.8* 8.1*   ------------------------------------------------------------------------------------------------------------------ estimated creatinine clearance is 12.2 mL/min (by C-G formula based on Cr of 2.98). ------------------------------------------------------------------------------------------------------------------ No results for input(s): TSH, T4TOTAL, T3FREE, THYROIDAB in the last 72 hours.  Invalid input(s): FREET3  Cardiac Enzymes No results for input(s): CKMB, TROPONINI, MYOGLOBIN in the last 168 hours.  Invalid input(s): CK ------------------------------------------------------------------------------------------------------------------ Invalid input(s): POCBNP ---------------------------------------------------------------------------------------------------------------  RADIOLOGY: US Renal  04/24/2016  CLINICAL DATA:  Renal failure, type II diabetes mellitus, chronic kidney disease, hyperlipidemia, hypertension EXAM: RENAL / URINARY TRACT ULTRASOUND COMPLETE COMPARISON:  CT abdomen and pelvis 04/15/2016 FINDINGS: Right Kidney: Length: 9.2 cm. Normal cortical thickness. Increased cortical echogenicity. Small amount of perinephric fluid. No mass, hydronephrosis or shadowing calcification. Left Kidney: Length: 10.1 cm. Normal cortical thickness. Increased cortical echogenicity. No mass, hydronephrosis or shadowing calcification. Bladder: Appears normal for degree of bladder distention. Incidentally noted small amount of ascites in RIGHT upper quadrant and a small LEFT pleural effusion. IMPRESSION: Medical renal disease changes of both kidneys. Small amount of perinephric fluid at RIGHT kidney, nonspecific and new since 04/15/2016 CT exam. Electronically Signed   By: Lavonia Dana M.D.   On: 04/24/2016 18:31    EKG:  Orders placed or performed during the hospital encounter of 04/23/16  . ED EKG  . ED EKG    ASSESSMENT AND PLAN:  Principal Problem:    Acute on chronic renal failure (HCC) #1. Acute on chronic renal failure with known history of CAD stage III,  likely due to infection, ATN, now with fluid overload,  appreciate nephrology input,  Nephrologist, discussed with family. The patient is approaching end-stage renal disease, since patient is fluid overloaded. She may need to have a hemodialysis initiated during this admission, if her kidney function does not improve.  pt and family agreed and permacth placed 04/25/16  now on HD per nephro, She need out pt HD set up. #2. Essential hypertension, added hydralazine.   BP still high, add amlodipin. #3. Acute CHF, diastolic,Echocardiogram done 10th of May 2017 revealed ejection fraction of 50%, with mild aortic regurgitation, moderate mitral valve regurgitation, severe tricuspid valve regurgitation   now after HD , better. #4 urinary tract infection, questionable pyelonephritis, acute, urine culture is pending, continue Rocephin for now. Following labs, adjust Abx on culture results #5. Dizziness, likely due to a bradycardia, relatively low blood pressure, patient developed orthostatic hypotension whenever she was started up, holding antihypertensive medications for now, she received IV fluids, which has stopped now due to CHF  BP is rising now, will add hydralazine. #6. Anemia of chronic disease, no active bleeding, recheck hemoglobin level tomorrow morning #7. Diabetes mellitus type 2, the patient is being continued on sliding scale insulin due to unpredictable oral intake.    Blood sugar is under control. #8 Hyperkalemia , was on Veltassa, now started on hemodialysis.  DRUG ALLERGIES: No Known Allergies  CODE STATUS:     Code Status Orders        Start     Ordered   04/23/16 2030  Full code   Continuous     04/23/16 2029    Code Status History    Date Active Date Inactive Code Status Order ID Comments User Context   04/15/2016  7:18 PM 04/19/2016  4:08 PM Full Code [id number]   Gladstone Lighter, MD Inpatient  TOTAL TIME TAKING CARE OF THIS PATIENT 35 minutes.    Vaughan Basta M.D on 04/26/2016 at 3:14 PM  Between 7am to 6pm - Pager - [phone]  After 6pm go to [email or web address] - password [email or web address] [place] [facility]  Office  [phone]  CC: Primary care physician; WHITE, Arlie Solomons, FNP

## 2016-04-26 NOTE — Care Management (Signed)
Patient lives at home alone in trailer.  Open with [facility] home health services through [facility].  Patient was delivered RW previous admission.  Patient has family that lives locally.  They provide her with financial support.  Granddaughter's rotate staying in the evening with the patient.  Patient PCP is [facility].  Previous admission financial information was submitted to [facility] in order decrease her out of pocket expense for her copay.   Previously patient's discharge medications were filled at [facility].    Patient is requiring acute O2.  Perm cath has been placed for HD.  Per [facility] liaison referral was sent out yesterday.    RNCM following for discharge planning

## 2016-04-26 NOTE — Progress Notes (Signed)
[facility] at [place] NAME: Anita Contreras    MR#:  [id number]  DATE OF BIRTH:  [birth year]  SUBJECTIVE:  CHIEF COMPLAINT:   Chief Complaint  Patient presents with  . Dizziness  Patient is a 60 year old Spanish female with past medical history significant for history of neuropathy, diabetes, vitamin D deficiency, hyperlipidemia, hypertension, CK D, who presents to the hospital with dizziness, weakness. On arrival to emergency room, she was noted to have improved blood pressure from a week ago admission to the hospital, however, patient was noted to be bradycardic. Patient's kidney function was also found to be markedly impaired, as compared to prior study, upper extremity 4 days ago. Patient was admitted to the hospital for further evaluation and treatment. Patient was evaluated via interpreter. Patient complained of back pains, fevers and chills, dysuria symptoms prior to coming to the hospital, now she is initiated on broad-spectrum antibiotic therapy and feels much better.  renal func did not improve and needed to have HD, plan is to get permacath today.  Review of Systems  Constitutional: Positive for fever, chills and malaise/fatigue. Negative for weight loss.  HENT: Negative for congestion.   Eyes: Negative for blurred vision and double vision.  Respiratory: Negative for cough, sputum production, shortness of breath and wheezing.   Cardiovascular: Negative for chest pain, palpitations, orthopnea, leg swelling and PND.  Gastrointestinal: Negative for nausea, vomiting, abdominal pain, diarrhea, constipation and blood in stool.  Genitourinary: Positive for dysuria and flank pain. Negative for urgency, frequency and hematuria.  Musculoskeletal: Negative for falls.  Neurological: Positive for weakness. Negative for dizziness, tremors, focal weakness and headaches.  Endo/Heme/Allergies: Does not bruise/bleed easily.   Psychiatric/Behavioral: Negative for depression. The patient does not have insomnia.     VITAL SIGNS: Blood pressure 160/57, pulse 78, temperature 98.4 F (36.9 C), temperature source Oral, resp. rate 20, height 4\' 5"  (1.346 m), weight 55.8 kg (123 lb 0.3 oz), last menstrual period 09/19/2001, SpO2 96 %.  PHYSICAL EXAMINATION:   GENERAL:  60 y.o.-year-old patient lying in the bed with no acute distress.  EYES: Pupils equal, round, reactive to light and accommodation. No scleral icterus. Extraocular muscles intact.  HEENT: Head atraumatic, normocephalic. Oropharynx and nasopharynx clear.  NECK:  Supple, no jugular venous distention. No thyroid enlargement, no tenderness.  LUNGS: Normal breath sounds bilaterally, no wheezing, rales,rhonchi or crepitation. No use of accessory muscles of respiration.  CARDIOVASCULAR: S1, S2 normal. No murmurs, rubs, or gallops.  ABDOMEN: Soft, nontender, nondistended. Bowel sounds present. No organomegaly or mass.  EXTREMITIES: No pedal edema, cyanosis, or clubbing. Mild CVS discomfort on percussion bilaterally NEUROLOGIC: Cranial nerves II through XII are intact. Muscle strength 5/5 in all extremities. Sensation intact. Gait not checked.  PSYCHIATRIC: The patient is alert and oriented x 3.  SKIN: No obvious rash, lesion, or ulcer.   ORDERS/RESULTS REVIEWED:   CBC  Recent Labs Lab 04/23/16 1620 04/24/16 0350  WBC 7.5 6.9  HGB 9.5* 8.3*  HCT 28.9* 25.4*  PLT 295 249  MCV 85.5 85.5  MCH 28.2 27.9  MCHC 33.0 32.7  RDW 16.3* 16.0*   ------------------------------------------------------------------------------------------------------------------  Chemistries   Recent Labs Lab 04/23/16 1620 04/24/16 0350 04/24/16 1906 04/25/16 0357  NA 128* 132*  --  136  K 5.1 5.7* 5.5* 5.6*  CL 97* 104  --  107  CO2 23 21*  --  22  GLUCOSE 89 97  --  88  BUN 71* 80*  --  73*  CREATININE 4.26* 4.33*  --  4.13*  CALCIUM 8.3* 7.7*  --  7.8*    ------------------------------------------------------------------------------------------------------------------ estimated creatinine clearance is 9.1 mL/min (by C-G formula based on Cr of 4.13). ------------------------------------------------------------------------------------------------------------------ No results for input(s): TSH, T4TOTAL, T3FREE, THYROIDAB in the last 72 hours.  Invalid input(s): FREET3  Cardiac Enzymes No results for input(s): CKMB, TROPONINI, MYOGLOBIN in the last 168 hours.  Invalid input(s): CK ------------------------------------------------------------------------------------------------------------------ Invalid input(s): POCBNP ---------------------------------------------------------------------------------------------------------------  RADIOLOGY: US Renal  04/24/2016  CLINICAL DATA:  Renal failure, type II diabetes mellitus, chronic kidney disease, hyperlipidemia, hypertension EXAM: RENAL / URINARY TRACT ULTRASOUND COMPLETE COMPARISON:  CT abdomen and pelvis 04/15/2016 FINDINGS: Right Kidney: Length: 9.2 cm. Normal cortical thickness. Increased cortical echogenicity. Small amount of perinephric fluid. No mass, hydronephrosis or shadowing calcification. Left Kidney: Length: 10.1 cm. Normal cortical thickness. Increased cortical echogenicity. No mass, hydronephrosis or shadowing calcification. Bladder: Appears normal for degree of bladder distention. Incidentally noted small amount of ascites in RIGHT upper quadrant and a small LEFT pleural effusion. IMPRESSION: Medical renal disease changes of both kidneys. Small amount of perinephric fluid at RIGHT kidney, nonspecific and new since 04/15/2016 CT exam. Electronically Signed   By: Lavonia Dana M.D.   On: 04/24/2016 18:31    EKG:  Orders placed or performed during the hospital encounter of 04/23/16  . ED EKG  . ED EKG    ASSESSMENT AND PLAN:  Principal Problem:   Acute on chronic renal failure  (HCC) #1. Acute on chronic renal failure with known history of CAD stage III,  likely due to infection, ATN, now with fluid overload,  appreciate nephrology input,  Nephrologist, discussed with family. The patient is approaching end-stage renal disease, since patient is fluid overloaded. She may need to have a hemodialysis initiated during this admission, if her kidney function does not improve.  pt and family agreed and permacth is planned today. #2. Essential hypertension, blood pressure is well controlled, not on medications at present #3. Acute CHF, diastolic,Echocardiogram done 10th of May 2017 revealed ejection fraction of 50%, with mild aortic regurgitation, moderate mitral valve regurgitation, severe tricuspid valve regurgitation   planned for permacath and HD. #4 urinary tract infection, questionable pyelonephritis, acute, urine culture is pending, continue Rocephin for now. Following labs, adjust Abx on culture results #5. Dizziness, likely due to a bradycardia, relatively low blood pressure, patient developed orthostatic hypotension whenever she was started up, holding antihypertensive medications for now, she received IV fluids, which has stopped now due to CHF  BP is rising now, will add hydralazine. #6. Anemia of chronic disease, no active bleeding, recheck hemoglobin level tomorrow morning #7. Diabetes mellitus type 2, the patient is being continued on sliding scale insulin due to unpredictable oral intake.  #8 Hyperkalemia , now patient is on Veltassa, following potassium level later today and tomorrow in the morning Management plans discussed with the patient, family and they are in agreement.   DRUG ALLERGIES: No Known Allergies  CODE STATUS:     Code Status Orders        Start     Ordered   04/23/16 2030  Full code   Continuous     04/23/16 2029    Code Status History    Date Active Date Inactive Code Status Order ID Comments User Context   04/15/2016  7:18 PM  04/19/2016  4:08 PM Full Code [id number]  Gladstone Lighter, MD Inpatient      TOTAL TIME TAKING CARE OF THIS PATIENT 35 minutes.  Vaughan Basta M.D on 04/26/2016 at 8:41 AM  Between 7am to 6pm - Pager - [phone]  After 6pm go to [email or web address] - password [email or web address] [place] [facility]  Office  [phone]  CC: Primary care physician; WHITE, Arlie Solomons, FNP

## 2016-04-27 LAB — GLUCOSE, CAPILLARY
GLUCOSE-CAPILLARY: 104 mg/dL — AB (ref 65–99)
GLUCOSE-CAPILLARY: 86 mg/dL (ref 65–99)
GLUCOSE-CAPILLARY: 168 mg/dL — AB (ref 65–99)
Glucose-Capillary: 168 mg/dL — ABNORMAL HIGH (ref 65–99)

## 2016-04-27 LAB — CBC
HCT: 22.8 % — ABNORMAL LOW (ref 35.0–47.0)
Hemoglobin: 7.4 g/dL — ABNORMAL LOW (ref 12.0–16.0)
MCH: 27.3 pg (ref 26.0–34.0)
MCHC: 32.3 g/dL (ref 32.0–36.0)
MCV: 84.6 fL (ref 80.0–100.0)
PLATELETS: 147 10*3/uL — AB (ref 150–440)
RBC: 2.7 MIL/uL — AB (ref 3.80–5.20)
RDW: 16.3 % — ABNORMAL HIGH (ref 11.5–14.5)
WBC: 5.8 10*3/uL (ref 3.6–11.0)

## 2016-04-27 LAB — URINE CULTURE: Culture: >=100000 — AB

## 2016-04-27 MED ORDER — LACTULOSE 10 GM/15ML PO SOLN
30.0000 g | Freq: Two times a day (BID) | ORAL | Status: DC
  Administered 2016-04-28: 30 g via ORAL
  Filled 2016-04-27: qty 60

## 2016-04-27 MED ORDER — EPOETIN ALFA 10000 UNIT/ML IJ SOLN
10000.0000 [IU] | INTRAMUSCULAR | Status: DC
  Administered 2016-04-27 – 2016-05-07 (×5): 10000 [IU] via INTRAVENOUS
  Filled 2016-04-27: qty 1

## 2016-04-27 NOTE — Progress Notes (Signed)
Notified Dr Verdell Carmine of pt family concerns and family request to speak with Dr; Dr acknowledged, spoke with family on telephone; Dr also ordered lactulose 30g BID

## 2016-04-27 NOTE — Progress Notes (Signed)
Pre Dialysis 

## 2016-04-27 NOTE — Progress Notes (Signed)
Tx started 

## 2016-04-27 NOTE — Progress Notes (Signed)
Subjective:   Seen and examined on hemodialysis. Tolerating treatment well. Third treatment today.   History taken with assistance of a Administrator, sports.   Complains of nausea   Objective:  Vital signs in last 24 hours:  Temp:  [97.9 F (36.6 C)-98.8 F (37.1 C)] 98.1 F (36.7 C) (05/20 1000) Pulse Rate:  [74-85] 80 (05/20 1130) Resp:  [12-19] 15 (05/20 1130) BP: (122-178)/(53-68) 154/62 mmHg (05/20 1130) SpO2:  [78 %-100 %] 99 % (05/20 1130) Weight:  [52 kg (114 lb 10.2 oz)-53.4 kg (117 lb 11.6 oz)] 53.4 kg (117 lb 11.6 oz) (05/20 1000)  Weight change: -2.3 kg (-5 lb 1.1 oz) Filed Weights   04/26/16 1015 04/26/16 1314 04/27/16 1000  Weight: 53.5 kg (117 lb 15.1 oz) 52 kg (114 lb 10.2 oz) 53.4 kg (117 lb 11.6 oz)    Intake/Output:    Intake/Output Summary (Last 24 hours) at 04/27/16 1145 Last data filed at 04/27/16 0800  Gross per 24 hour  Intake      0 ml  Output   1500 ml  Net  -1500 ml     Physical Exam: General: Laying in the bed, no acute distress  HEENT Anicteric, moist oral mucous membranes  Neck supple  Pulm/lungs Normal effort, room air, clear to auscultation  CVS/Heart No rub or gallop  Abdomen:  Soft, nontender, nondistended  Extremities: Trace peripheral edema  Neurologic: Alert, oriented  Skin: No acute rashes   Rt IJ PC/Dr Dew / 5/18       Basic Metabolic Panel:   Recent Labs Lab 04/23/16 1620 04/24/16 0350 04/24/16 1906 04/25/16 0357 04/26/16 0934  NA 128* 132*  --  136 139  K 5.1 5.7* 5.5* 5.6* 4.5  CL 97* 104  --  107 104  CO2 23 21*  --  22 27  GLUCOSE 89 97  --  88 129*  BUN 71* 80*  --  73* 40*  CREATININE 4.26* 4.33*  --  4.13* 2.98*  CALCIUM 8.3* 7.7*  --  7.8* 8.1*  PHOS  --   --   --  5.3*  --      CBC:  Recent Labs Lab 04/23/16 1620 04/24/16 0350 04/27/16 0623  WBC 7.5 6.9 5.8  HGB 9.5* 8.3* 7.4*  HCT 28.9* 25.4* 22.8*  MCV 85.5 85.5 84.6  PLT 295 249 147*      Microbiology:  Recent Results  (from the past 720 hour(s))  Urine culture     Status: Abnormal   Collection Time: 04/23/16  9:43 PM  Result Value Ref Range Status   Specimen Description URINE, CATHETERIZED  Final   Special Requests NONE  Final   Culture >=100,000 COLONIES/mL VIRIDANS STREPTOCOCCUS (A)  Final   Report Status 04/27/2016 FINAL  Final  MRSA PCR Screening     Status: None   Collection Time: 04/25/16  6:45 AM  Result Value Ref Range Status   MRSA by PCR NEGATIVE NEGATIVE Final    Comment:        The GeneXpert MRSA Assay (FDA approved for NASAL specimens only), is one component of a comprehensive MRSA colonization surveillance program. It is not intended to diagnose MRSA infection nor to guide or monitor treatment for MRSA infections.     Coagulation Studies: No results for input(s): LABPROT, INR in the last 72 hours.  Urinalysis: No results for input(s): COLORURINE, LABSPEC, PHURINE, GLUCOSEU, HGBUR, BILIRUBINUR, KETONESUR, PROTEINUR, UROBILINOGEN, NITRITE, LEUKOCYTESUR in the last 72 hours.  Invalid input(s): APPERANCEUR  Imaging: No results found.   Medications:     . amLODipine  10 mg Oral Daily  . atorvastatin  40 mg Oral QHS  . calcium acetate  667 mg Oral TID WC  . cefTRIAXone (ROCEPHIN)  IV  1 g Intravenous Q24H  . docusate sodium  100 mg Oral BID  . epoetin (EPOGEN/PROCRIT) injection  10,000 Units Intravenous Q T,Th,Sa-HD  . heparin  5,000 Units Subcutaneous Q8H  . hydrALAZINE  25 mg Oral Q8H  . insulin aspart  0-9 Units Subcutaneous TID WC     Assessment/ Plan:  60 y.o. Hispanic female with insulin dependent diabetes mellitus type II, diabetic retinopathy, hypertension, hyperlipidemia, hemorrhagic cystitis, migraine headaches, osteoarthritis, osteoporosis who was admitted to [facility] on 04/15/2016   1. ESRD:  Secondary to poorly controlled DM and HTN in context of other complications of DM (neuropathy, Nephropathy) - serologies neg previous admission - permcath placed 5/18  and 1st HD 5/18 Patient seen during dialysis: third treatment Tolerating well  D/c planning for [facility] or [facility]  2. Hypertension:  - variable control -  Recently started on amlodipine - Start losartan 100mg  daily  3. Diabetes Mellitus type II with chronic kidney disease: insulin dependent. Hemoglobin A1c 7.2% - continue glucose control.   4. Secondary Hyperparathyroidism: PTH 105 - calcium acetate for binding.   5. Urinalysis suggesting UTI - Currently on Rocephin - Management as per hospitalist team  6. Anemia with chronic kidney disease: - EPO with treatment.   LOS: 4 Anita Contreras 5/20/201711:45 AM

## 2016-04-27 NOTE — Progress Notes (Signed)
[place] at [place] NAME: Brinly Shutler    MR#:  [id number]  DATE OF BIRTH:  05/29/1956  SUBJECTIVE:  CHIEF COMPLAINT:    Patient here due to acute on chronic renal failure now progressed to ESRD. Seen at hemodialysis today. No complaints presently.  Review of Systems  Constitutional: Negative for fever, chills, weight loss, malaise/fatigue and diaphoresis.  HENT: Negative for congestion.   Eyes: Negative for blurred vision and double vision.  Respiratory: Negative for cough, sputum production, shortness of breath and wheezing.   Cardiovascular: Negative for chest pain, palpitations, orthopnea, leg swelling and PND.  Gastrointestinal: Negative for nausea, vomiting, abdominal pain, diarrhea, constipation and blood in stool.  Genitourinary: Negative for urgency, frequency and hematuria.  Musculoskeletal: Negative for falls.  Neurological: Positive for weakness. Negative for dizziness, tremors, focal weakness and headaches.  Endo/Heme/Allergies: Does not bruise/bleed easily.  Psychiatric/Behavioral: Negative for depression. The patient does not have insomnia.     VITAL SIGNS: Blood pressure 169/60, pulse 86, temperature 98.3 F (36.8 C), temperature source Oral, resp. rate 17, height 4\' 5"  (1.346 m), weight 51.9 kg (114 lb 6.7 oz), last menstrual period 09/19/2001, SpO2 100 %.  PHYSICAL EXAMINATION:   GENERAL:  60 y.o.-year-old patient lying in the bed with no acute distress.  EYES: Pupils equal, round, reactive to light and accommodation. No scleral icterus. Extraocular muscles intact.  HEENT: Head atraumatic, normocephalic. Oropharynx and nasopharynx clear.  NECK:  Supple, no jugular venous distention. No thyroid enlargement, no tenderness.  LUNGS: Normal breath sounds bilaterally, no wheezing, rales,rhonchi or crepitation. No use of accessory muscles of respiration.  CARDIOVASCULAR: S1, S2 normal. No murmurs, rubs, or  gallops.  ABDOMEN: Soft, nontender, nondistended. Bowel sounds present. No organomegaly or mass.  EXTREMITIES: No pedal edema, cyanosis, or clubbing. Mild CVS discomfort on percussion bilaterally NEUROLOGIC: Cranial nerves II through XII are intact. Muscle strength 5/5 in all extremities. Sensation intact. Gait not checked.  PSYCHIATRIC: The patient is alert and oriented x 3.  SKIN: No obvious rash, lesion, or ulcer.   Right chest dialysis catheter in place.  ORDERS/RESULTS REVIEWED:   CBC  Recent Labs Lab 04/23/16 1620 04/24/16 0350 04/27/16 0623  WBC 7.5 6.9 5.8  HGB 9.5* 8.3* 7.4*  HCT 28.9* 25.4* 22.8*  PLT 295 249 147*  MCV 85.5 85.5 84.6  MCH 28.2 27.9 27.3  MCHC 33.0 32.7 32.3  RDW 16.3* 16.0* 16.3*   ------------------------------------------------------------------------------------------------------------------  Chemistries   Recent Labs Lab 04/23/16 1620 04/24/16 0350 04/24/16 1906 04/25/16 0357 04/26/16 0934  NA 128* 132*  --  136 139  K 5.1 5.7* 5.5* 5.6* 4.5  CL 97* 104  --  107 104  CO2 23 21*  --  22 27  GLUCOSE 89 97  --  88 129*  BUN 71* 80*  --  73* 40*  CREATININE 4.26* 4.33*  --  4.13* 2.98*  CALCIUM 8.3* 7.7*  --  7.8* 8.1*   ------------------------------------------------------------------------------------------------------------------ estimated creatinine clearance is 12.2 mL/min (by C-G formula based on Cr of 2.98). ------------------------------------------------------------------------------------------------------------------ No results for input(s): TSH, T4TOTAL, T3FREE, THYROIDAB in the last 72 hours.  Invalid input(s): FREET3  Cardiac Enzymes No results for input(s): CKMB, TROPONINI, MYOGLOBIN in the last 168 hours.  Invalid input(s): CK ------------------------------------------------------------------------------------------------------------------ Invalid input(s):  POCBNP ---------------------------------------------------------------------------------------------------------------  RADIOLOGY: No results found.  ASSESSMENT AND PLAN:  Principal Problem:   Acute on chronic renal failure (HCC)  #1. Acute on chronic renal failure with known  history of CAD stage III - likely due to infection, ATN -Patient has resting ESRD and started on hemodialysis. -Continue care as per nephrology. Patient likely will need outpatient placement for hemodialysis..  #2. Essential hypertension-stable continue hydralazine, Norvasc.  #3. Acute CHF, diastolic,Echocardiogram done 10th of May 2017 revealed ejection fraction of 50%, with mild aortic regurgitation, moderate mitral valve regurgitation, severe tricuspid valve regurgitation  -Improved with hemodialysis and will monitor.  #4 urinary tract infection, questionable pyelonephritis, acute, urine culture is pending, continue Rocephin for now.  -Urine culture growing 100,000 colonies of strep viridans. Await sensitivities.  #5. Dizziness-was due to some mild hypotension now resolved.  #6. Anemia of chronic disease-hemoglobin stable. No acute need for transfusion. We will monitor.  #7. Diabetes mellitus type 2-blood sugar stable. Continue sliding scale insulin.  #8 Hyperkalemia - resolved - was on Veltassa, now started on hemodialysis and stable.   DRUG ALLERGIES: No Known Allergies  CODE STATUS:     Code Status Orders        Start     Ordered   04/23/16 2030  Full code   Continuous     04/23/16 2029    Code Status History    Date Active Date Inactive Code Status Order ID Comments User Context   04/15/2016  7:18 PM 04/19/2016  4:08 PM Full Code [id number]  Gladstone Lighter, MD Inpatient      TOTAL TIME TAKING CARE OF THIS PATIENT: 25 minutes.    Henreitta Leber M.D on 04/27/2016 at 2:13 PM  Between 7am to 6pm - Pager - [phone]  After 6pm go to [email or web address] - password [email or web address]  Office  [phone]  CC: Primary care physician; WHITE, Arlie Solomons, FNP

## 2016-04-27 NOTE — Progress Notes (Signed)
Tx complete  

## 2016-04-27 NOTE — Progress Notes (Signed)
Post Hemodialysis 

## 2016-04-28 LAB — GLUCOSE, CAPILLARY
GLUCOSE-CAPILLARY: 118 mg/dL — AB (ref 65–99)
GLUCOSE-CAPILLARY: 210 mg/dL — AB (ref 65–99)
GLUCOSE-CAPILLARY: 133 mg/dL — AB (ref 65–99)
Glucose-Capillary: 125 mg/dL — ABNORMAL HIGH (ref 65–99)

## 2016-04-28 MED ORDER — CEFUROXIME AXETIL 500 MG PO TABS
250.0000 mg | ORAL_TABLET | Freq: Two times a day (BID) | ORAL | Status: DC
  Administered 2016-04-28: 250 mg via ORAL
  Filled 2016-04-28: qty 1

## 2016-04-28 MED ORDER — LACTULOSE 10 GM/15ML PO SOLN: 30.0000 g | Freq: Two times a day (BID) | ORAL | Status: DC | PRN

## 2016-04-28 NOTE — Progress Notes (Signed)
[place] at [place] NAME: Anita Contreras    MR#:  [id number]  DATE OF BIRTH:  March 05, 1956  SUBJECTIVE:  CHIEF COMPLAINT:    Patient here due to acute on chronic renal failure now progressed to ESRD. Patient seen with the help of a Spanish interpreter. Complaining of some abdominal pain but improved since admission. Had a bowel movement today.  Review of Systems  Constitutional: Negative for fever, chills, weight loss, malaise/fatigue and diaphoresis.  HENT: Negative for congestion.   Eyes: Negative for blurred vision and double vision.  Respiratory: Negative for cough, sputum production, shortness of breath and wheezing.   Cardiovascular: Negative for chest pain, palpitations, orthopnea, leg swelling and PND.  Gastrointestinal: Positive for abdominal pain. Negative for nausea, vomiting, diarrhea, constipation and blood in stool.  Genitourinary: Negative for urgency, frequency and hematuria.  Musculoskeletal: Negative for falls.  Neurological: Positive for weakness. Negative for dizziness, tremors, focal weakness and headaches.  Endo/Heme/Allergies: Does not bruise/bleed easily.  Psychiatric/Behavioral: Negative for depression. The patient does not have insomnia.     VITAL SIGNS: Blood pressure 134/49, pulse 73, temperature 99.6 F (37.6 C), temperature source Oral, resp. rate 16, height 4\' 5"  (1.346 m), weight 51.9 kg (114 lb 6.7 oz), last menstrual period 09/19/2001, SpO2 98 %.  PHYSICAL EXAMINATION:   GENERAL:  60 y.o.-year-old patient lying in the bed with no acute distress.  EYES: Pupils equal, round, reactive to light and accommodation. No scleral icterus. Extraocular muscles intact.  HEENT: Head atraumatic, normocephalic. Oropharynx and nasopharynx clear.  NECK:  Supple, no jugular venous distention. No thyroid enlargement, no tenderness.  LUNGS: Normal breath sounds bilaterally, no wheezing, rales,rhonchi or  crepitation. No use of accessory muscles of respiration.  CARDIOVASCULAR: S1, S2 normal. No murmurs, rubs, or gallops.  ABDOMEN: Soft, nontender, nondistended. Bowel sounds present. No organomegaly or mass.  EXTREMITIES: No pedal edema, cyanosis, or clubbing. Mild CVS discomfort on percussion bilaterally NEUROLOGIC: Cranial nerves II through XII are intact. Muscle strength 5/5 in all extremities. Sensation intact. Gait not checked.  PSYCHIATRIC: The patient is alert and oriented x 3.  SKIN: No obvious rash, lesion, or ulcer.   Right chest dialysis catheter in place.  ORDERS/RESULTS REVIEWED:   CBC  Recent Labs Lab 04/23/16 1620 04/24/16 0350 04/27/16 0623  WBC 7.5 6.9 5.8  HGB 9.5* 8.3* 7.4*  HCT 28.9* 25.4* 22.8*  PLT 295 249 147*  MCV 85.5 85.5 84.6  MCH 28.2 27.9 27.3  MCHC 33.0 32.7 32.3  RDW 16.3* 16.0* 16.3*   ------------------------------------------------------------------------------------------------------------------  Chemistries   Recent Labs Lab 04/23/16 1620 04/24/16 0350 04/24/16 1906 04/25/16 0357 04/26/16 0934  NA 128* 132*  --  136 139  K 5.1 5.7* 5.5* 5.6* 4.5  CL 97* 104  --  107 104  CO2 23 21*  --  22 27  GLUCOSE 89 97  --  88 129*  BUN 71* 80*  --  73* 40*  CREATININE 4.26* 4.33*  --  4.13* 2.98*  CALCIUM 8.3* 7.7*  --  7.8* 8.1*   ------------------------------------------------------------------------------------------------------------------ estimated creatinine clearance is 12.2 mL/min (by C-G formula based on Cr of 2.98). ------------------------------------------------------------------------------------------------------------------ No results for input(s): TSH, T4TOTAL, T3FREE, THYROIDAB in the last 72 hours.  Invalid input(s): FREET3  Cardiac Enzymes No results for input(s): CKMB, TROPONINI, MYOGLOBIN in the last 168 hours.  Invalid input(s):  CK ------------------------------------------------------------------------------------------------------------------ Invalid input(s): POCBNP ---------------------------------------------------------------------------------------------------------------  RADIOLOGY: No results found.  ASSESSMENT AND PLAN:  Principal  Problem:   Acute on chronic renal failure ([place])  #1. Acute on chronic renal failure with known history of CAD stage III - did not improve w/ conventional treatment and progressed to ESRD on HD.  Likely cause of CKD is diabetic Nephropathy. -Continue care as per nephrology. Patient likely will need outpatient placement for hemodialysis.  #2. Essential hypertension-stable continue hydralazine, Norvasc.  #3. Acute CHF, diastolic,Echocardiogram done 10th of May 2017 revealed ejection fraction of 50%, with mild aortic regurgitation, moderate mitral valve regurgitation, severe tricuspid valve regurgitation  -Improved with hemodialysis and will monitor.  #4 urinary tract infection - urine culture growing Strep. Viridans.  - will change from IV Ceftriaxone to Ceftin.    #5. Dizziness-was due to some mild hypotension now resolved.  #6. Anemia of chronic disease-hemoglobin stable. No acute need for transfusion. We will monitor.  #7. Diabetes mellitus type 2-blood sugar stable. Continue sliding scale insulin.  #8 Hyperkalemia - resolved - was on Veltassa, now started on hemodialysis and stable.   #9 Constipation - resolved with lactulose and will monitor.  Awaiting outpatient placement for HD prior to discharge.   DRUG ALLERGIES: No Known Allergies  CODE STATUS:     Code Status Orders        Start     Ordered   04/23/16 2030  Full code   Continuous     04/23/16 2029    Code Status History    Date Active Date Inactive Code Status Order ID Comments User Context   04/15/2016  7:18 PM 04/19/2016  4:08 PM Full Code [id number]  Gladstone Lighter, MD Inpatient      TOTAL  TIME TAKING CARE OF THIS PATIENT: 25 minutes.    Henreitta Leber M.D on 04/28/2016 at 2:03 PM  Between 7am to 6pm - Pager - [phone]  After 6pm go to [email or web address] - password [email or web address] [facility]  Office  [phone]  CC: Primary care physician; WHITE, Arlie Solomons, FNP

## 2016-04-28 NOTE — Progress Notes (Signed)
Subjective:   Hemodialysis treatment yesterday. Tolerated well.   History taken with assistance of a Administrator, sports.   Complains of nausea, vomiting and abdominal pain today.   Two granddaughters at bedside.    Objective:  Vital signs in last 24 hours:  Temp:  [98.2 F (36.8 C)-99.6 F (37.6 C)] 99.6 F (37.6 C) (05/21 0503) Pulse Rate:  [75-89] 75 (05/21 0503) Resp:  [12-20] 20 (05/21 0503) BP: (144-173)/(51-68) 153/60 mmHg (05/21 0819) SpO2:  [99 %-100 %] 99 % (05/21 0503) Weight:  [51.9 kg (114 lb 6.7 oz)] 51.9 kg (114 lb 6.7 oz) (05/20 1339)  Weight change: -0.1 kg (-3.5 oz) Filed Weights   04/26/16 1314 04/27/16 1000 04/27/16 1339  Weight: 52 kg (114 lb 10.2 oz) 53.4 kg (117 lb 11.6 oz) 51.9 kg (114 lb 6.7 oz)    Intake/Output:    Intake/Output Summary (Last 24 hours) at 04/28/16 1129 Last data filed at 04/28/16 0820  Gross per 24 hour  Intake    240 ml  Output   1500 ml  Net  -1260 ml     Physical Exam: General: Laying in the bed, no acute distress  HEENT Anicteric, moist oral mucous membranes  Neck supple  Pulm/lungs Normal effort, room air, clear to auscultation  CVS/Heart regular  Abdomen:  Soft, nontender, nondistended  Extremities: No peripheral edema  Neurologic: Alert, oriented  Skin: No acute rashes  Access:  Rt IJ PC/Dr Dew / 5/18       Basic Metabolic Panel:   Recent Labs Lab 04/23/16 1620 04/24/16 0350 04/24/16 1906 04/25/16 0357 04/26/16 0934  NA 128* 132*  --  136 139  K 5.1 5.7* 5.5* 5.6* 4.5  CL 97* 104  --  107 104  CO2 23 21*  --  22 27  GLUCOSE 89 97  --  88 129*  BUN 71* 80*  --  73* 40*  CREATININE 4.26* 4.33*  --  4.13* 2.98*  CALCIUM 8.3* 7.7*  --  7.8* 8.1*  PHOS  --   --   --  5.3*  --      CBC:  Recent Labs Lab 04/23/16 1620 04/24/16 0350 04/27/16 0623  WBC 7.5 6.9 5.8  HGB 9.5* 8.3* 7.4*  HCT 28.9* 25.4* 22.8*  MCV 85.5 85.5 84.6  PLT 295 249 147*      Microbiology:  Recent Results  (from the past 720 hour(s))  Urine culture     Status: Abnormal   Collection Time: 04/23/16  9:43 PM  Result Value Ref Range Status   Specimen Description URINE, CATHETERIZED  Final   Special Requests NONE  Final   Culture >=100,000 COLONIES/mL VIRIDANS STREPTOCOCCUS (A)  Final   Report Status 04/27/2016 FINAL  Final  MRSA PCR Screening     Status: None   Collection Time: 04/25/16  6:45 AM  Result Value Ref Range Status   MRSA by PCR NEGATIVE NEGATIVE Final    Comment:        The GeneXpert MRSA Assay (FDA approved for NASAL specimens only), is one component of a comprehensive MRSA colonization surveillance program. It is not intended to diagnose MRSA infection nor to guide or monitor treatment for MRSA infections.     Coagulation Studies: No results for input(s): LABPROT, INR in the last 72 hours.  Urinalysis: No results for input(s): COLORURINE, LABSPEC, PHURINE, GLUCOSEU, HGBUR, BILIRUBINUR, KETONESUR, PROTEINUR, UROBILINOGEN, NITRITE, LEUKOCYTESUR in the last 72 hours.  Invalid input(s): APPERANCEUR    Imaging: No results  found.   Medications:     . amLODipine  10 mg Oral Daily  . atorvastatin  40 mg Oral QHS  . calcium acetate  667 mg Oral TID WC  . cefTRIAXone (ROCEPHIN)  IV  1 g Intravenous Q24H  . docusate sodium  100 mg Oral BID  . epoetin (EPOGEN/PROCRIT) injection  10,000 Units Intravenous Q T,Th,Sa-HD  . heparin  5,000 Units Subcutaneous Q8H  . hydrALAZINE  25 mg Oral Q8H  . insulin aspart  0-9 Units Subcutaneous TID WC  . lactulose  30 g Oral BID     Assessment/ Plan:  60 y.o. Hispanic female with insulin dependent diabetes mellitus type II, diabetic retinopathy, hypertension, hyperlipidemia, hemorrhagic cystitis, migraine headaches, osteoarthritis, osteoporosis who was admitted to [facility] on 04/15/2016   1. ESRD:  Secondary to diabetic nephropathy and hypertension. GI complaints could be uremic symptoms. Serologies neg previous admission - permcath  placed and first hemodialysis session 5/18. Completed three hemodialysis treatments.  Schedule patient for next treatment on Tuesday.  Tolerating well  D/c planning for [facility] or [facility]  2. Hypertension:  -  Recently started on amlodipine and losartan  3. Diabetes Mellitus type II with chronic kidney disease: insulin dependent. Hemoglobin A1c 7.2% - continue glucose control.   4. Secondary Hyperparathyroidism: PTH 105 - calcium acetate for phos binding.   5. Urinalysis suggesting UTI - Currently on Rocephin - Management as per hospitalist team, switching to PO  6. Anemia with chronic kidney disease: - EPO with treatment.   LOS: [place], [place] 5/21/201711:29 AM

## 2016-04-29 ENCOUNTER — Inpatient Hospital Stay: Payer: Self-pay

## 2016-04-29 LAB — GLUCOSE, CAPILLARY
GLUCOSE-CAPILLARY: 115 mg/dL — AB (ref 65–99)
GLUCOSE-CAPILLARY: 107 mg/dL — AB (ref 65–99)
Glucose-Capillary: 284 mg/dL — ABNORMAL HIGH (ref 65–99)
Glucose-Capillary: 153 mg/dL — ABNORMAL HIGH (ref 65–99)

## 2016-04-29 LAB — CBC
HCT: 23 % — ABNORMAL LOW (ref 35.0–47.0)
HEMOGLOBIN: 7.6 g/dL — AB (ref 12.0–16.0)
MCH: 28.1 pg (ref 26.0–34.0)
MCHC: 33 g/dL (ref 32.0–36.0)
MCV: 85.3 fL (ref 80.0–100.0)
Platelets: 169 10*3/uL (ref 150–440)
RBC: 2.7 MIL/uL — AB (ref 3.80–5.20)
RDW: 16.2 % — ABNORMAL HIGH (ref 11.5–14.5)
WBC: 9.5 10*3/uL (ref 3.6–11.0)

## 2016-04-29 MED ORDER — CEFUROXIME AXETIL 500 MG PO TABS
250.0000 mg | ORAL_TABLET | Freq: Every day | ORAL | Status: AC
Start: 2016-04-29 — End: 2016-05-01
  Administered 2016-04-29 – 2016-05-01 (×3): 250 mg via ORAL
  Filled 2016-04-29: qty 1
  Filled 2016-04-29: qty 2
  Filled 2016-04-29: qty 1

## 2016-04-29 NOTE — Progress Notes (Signed)
Renal adjustment for antibiotics per policy:  Patient is a 60 yo female with orders for cefuroxime 250 mg po q12h.  Patient with ESRD requiring HD.  Will renally adjust antibiotics per policy to cefuroxime [id number] mg po daily with dose to be given after HD on HD days. MD entered original for 3 days of therapy.  Therefore will enter orders for stop date of 05/02/15.  Murrell Converse, PharmD Clinical Pharmacist 04/29/2016

## 2016-04-29 NOTE — Progress Notes (Signed)
[facility] [place] NAME: Anita Contreras    MR#:  [id number]  DATE OF BIRTH:  01/11/1956  SUBJECTIVE:  CHIEF COMPLAINT:    Patient here due to acute on chronic renal failure now progressed to ESRD. Patient seen with the help of a Spanish interpreter. Abdominal pain resolved but had some vomiting today. Positive cough which is nonproductive.    Review of Systems  Constitutional: Negative for fever, chills, weight loss, malaise/fatigue and diaphoresis.  HENT: Negative for congestion.   Eyes: Negative for blurred vision and double vision.  Respiratory: Negative for cough, sputum production, shortness of breath and wheezing.   Cardiovascular: Negative for chest pain, palpitations, orthopnea, leg swelling and PND.  Gastrointestinal: Positive for abdominal pain. Negative for nausea, vomiting, diarrhea, constipation and blood in stool.  Genitourinary: Negative for urgency, frequency and hematuria.  Musculoskeletal: Negative for falls.  Neurological: Positive for weakness. Negative for dizziness, tremors, focal weakness and headaches.  Endo/Heme/Allergies: Does not bruise/bleed easily.  Psychiatric/Behavioral: Negative for depression. The patient does not have insomnia.     VITAL SIGNS: Blood pressure 152/56, pulse 70, temperature 98 F (36.7 C), temperature source Oral, resp. rate 17, height 4\' 5"  (1.346 m), weight 51.9 kg (114 lb 6.7 oz), last menstrual period 09/19/2001, SpO2 100 %.  PHYSICAL EXAMINATION:   GENERAL:  60 y.o.-year-old patient lying in the bed with no acute distress.  EYES: Pupils equal, round, reactive to light and accommodation. No scleral icterus. Extraocular muscles intact.  HEENT: Head atraumatic, normocephalic. Oropharynx and nasopharynx clear.  NECK:  Supple, no jugular venous distention. No thyroid enlargement, no tenderness.  LUNGS: Normal breath sounds bilaterally, no wheezing, rales,rhonchi or  crepitation. No use of accessory muscles of respiration.  CARDIOVASCULAR: S1, S2 normal. No murmurs, rubs, or gallops.  ABDOMEN: Soft, nontender, nondistended. Bowel sounds present. No organomegaly or mass.  EXTREMITIES: No pedal edema, cyanosis, or clubbing. Mild CVS discomfort on percussion bilaterally NEUROLOGIC: Cranial nerves II through XII are intact. No focal motor or sensory deficits appreciated bilaterally. PSYCHIATRIC: The patient is alert and oriented x 3.  SKIN: No obvious rash, lesion, or ulcer.   Right chest dialysis catheter in place.  ORDERS/RESULTS REVIEWED:   CBC  Recent Labs Lab 04/23/16 1620 04/24/16 0350 04/27/16 0623 04/29/16 0514  WBC 7.5 6.9 5.8 9.5  HGB 9.5* 8.3* 7.4* 7.6*  HCT 28.9* 25.4* 22.8* 23.0*  PLT 295 249 147* 169  MCV 85.5 85.5 84.6 85.3  MCH 28.2 27.9 27.3 28.1  MCHC 33.0 32.7 32.3 33.0  RDW 16.3* 16.0* 16.3* 16.2*   ------------------------------------------------------------------------------------------------------------------  Chemistries   Recent Labs Lab 04/23/16 1620 04/24/16 0350 04/24/16 1906 04/25/16 0357 04/26/16 0934  NA 128* 132*  --  136 139  K 5.1 5.7* 5.5* 5.6* 4.5  CL 97* 104  --  107 104  CO2 23 21*  --  22 27  GLUCOSE 89 97  --  88 129*  BUN 71* 80*  --  73* 40*  CREATININE 4.26* 4.33*  --  4.13* 2.98*  CALCIUM 8.3* 7.7*  --  7.8* 8.1*   ------------------------------------------------------------------------------------------------------------------ estimated creatinine clearance is 12.2 mL/min (by C-G formula based on Cr of 2.98). ------------------------------------------------------------------------------------------------------------------ No results for input(s): TSH, T4TOTAL, T3FREE, THYROIDAB in the last 72 hours.  Invalid input(s): FREET3  Cardiac Enzymes No results for input(s): CKMB, TROPONINI, MYOGLOBIN in the last 168 hours.  Invalid input(s):  CK ------------------------------------------------------------------------------------------------------------------ Invalid input(s): POCBNP ---------------------------------------------------------------------------------------------------------------  RADIOLOGY: Dg Chest  1 View  04/29/2016  CLINICAL DATA:  Cough EXAM: CHEST 1 VIEW COMPARISON:  04/23/2016 chest radiograph. FINDINGS: Right internal jugular central venous catheter terminates in the right atrium. Stable cardiomediastinal silhouette with mild cardiomegaly. No pneumothorax. Small bilateral pleural effusions, decreased bilaterally. Mild pulmonary edema, slightly improved. Patchy bibasilar lung opacities, slightly improved bilaterally. IMPRESSION: 1. Mild congestive heart failure, slightly improved . 2. Small bilateral pleural effusions, decreased bilaterally. 3. Patchy bibasilar lung opacities, slightly improved bilaterally, favor atelectasis. Electronically Signed   By: Ilona Sorrel M.D.   On: 04/29/2016 14:41    ASSESSMENT AND PLAN:  Principal Problem:   Acute on chronic renal failure (HCC)  #1. Acute on chronic renal failure with known history of CKD stage III - did not improve w/ conventional treatment and progressed to ESRD on HD.  Likely cause of CKD is diabetic Nephropathy. - to have HD tomorrow.  Planning for outpatient HD placement in [place].  -Continue care as per nephrology.   #2. Essential hypertension-stable continue hydralazine, Norvasc.  #3. Acute CHF, diastolic,Echocardiogram done 10th of May 2017 revealed ejection fraction of 50%, with mild aortic regurgitation, moderate mitral valve regurgitation, severe tricuspid valve regurgitation  -Improved with hemodialysis and will monitor. - had some cough today and CXR showing some mild fluid overload.  No pneumonia and will monitor.   #4 Vomiting - pt. Having some vomiting today and says that she has been having it for days but not all witnessed by nursing staff.  -  if has persistent vomiting will get upper GI series.   #5 urinary tract infection - urine culture growing Strep. Viridans.  - cont. Ceftin for 5 days.     #6. Dizziness-was due to some mild hypotension now resolved.  #7. Anemia of chronic disease-hemoglobin stable. No acute need for transfusion. We will monitor.  #8. Diabetes mellitus type 2-blood sugar stable. Continue sliding scale insulin.  #9 Hyperkalemia - resolved - was on Veltassa, now started on hemodialysis and stable.   #10 Constipation - resolved with lactulose and will monitor.  Awaiting outpatient placement for HD prior to discharge.   DRUG ALLERGIES: No Known Allergies  CODE STATUS:     Code Status Orders        Start     Ordered   04/23/16 2030  Full code   Continuous     04/23/16 2029    Code Status History    Date Active Date Inactive Code Status Order ID Comments User Context   04/15/2016  7:18 PM 04/19/2016  4:08 PM Full Code [id number]  Gladstone Lighter, MD Inpatient      TOTAL TIME TAKING CARE OF THIS PATIENT: 30 minutes.    Henreitta Leber M.D on 04/29/2016 at 3:22 PM  Between 7am to 6pm - Pager - [phone]  After 6pm go to [email or web address] - password [email or web address] [facility]  Office  [phone]  CC: Primary care physician; WHITE, Arlie Solomons, FNP

## 2016-04-29 NOTE — Progress Notes (Signed)
Inpatient Diabetes Program Recommendations  AACE/ADA: New Consensus Statement on Inpatient Glycemic Control (2015)  Target Ranges:  Prepandial:   less than 140 mg/dL      Peak postprandial:   less than 180 mg/dL (1-2 hours)      Critically ill patients:  140 - 180 mg/dL   Review of Glycemic Control:  Results for CHENEQUA, CHIAVERINI (MRN [id number]) as of 04/29/2016 12:16  Ref. Range 04/28/2016 11:38 04/28/2016 16:52 04/28/2016 20:55 04/29/2016 08:04 04/29/2016 12:09  Glucose-Capillary Latest Ref Range: 65-99 mg/dL 118 (H) 210 (H) 133 (H) 115 (H) 284 (H)   Diabetes history: Type 2 diabetes Outpatient Diabetes medications: Novolin 70/30 15 units daily Current orders for Inpatient glycemic control:  Novolog sensitive tid with meals   Inpatient Diabetes Program Recommendations:    Consider restarting a portion of patient's home dose of 70/30.  May consider Novolog 70/30 8 units q AM.  Thanks, Adah Perl, RN, BC-ADM Inpatient Diabetes Coordinator Pager [phone] (8a-5p)

## 2016-04-29 NOTE — Progress Notes (Signed)
Subjective:  Patient last had dialysis on Saturday. She tolerated this well. Resting comfortably in bed. She is due for dialysis again tomorrow.   Objective:  Vital signs in last 24 hours:  Temp:  [98.6 F (37 C)-99.6 F (37.6 C)] 99.6 F (37.6 C) (05/22 0527) Pulse Rate:  [71-78] 78 (05/22 0527) Resp:  [18-20] 18 (05/22 0527) BP: (134-162)/(49-56) 162/54 mmHg (05/22 0527) SpO2:  [99 %] 99 % (05/22 0527)  Weight change:  Filed Weights   04/26/16 1314 04/27/16 1000 04/27/16 1339  Weight: 52 kg (114 lb 10.2 oz) 53.4 kg (117 lb 11.6 oz) 51.9 kg (114 lb 6.7 oz)    Intake/Output:    Intake/Output Summary (Last 24 hours) at 04/29/16 1322 Last data filed at 04/29/16 1000  Gross per 24 hour  Intake      0 ml  Output      1 ml  Net     -1 ml     Physical Exam: General: Laying in the bed, no acute distress  HEENT Anicteric, moist oral mucous membranes  Neck supple  Pulm/lungs Normal effort, room air, clear to auscultation  CVS/Heart regular  Abdomen:  Soft, nontender, nondistended  Extremities: No peripheral edema  Neurologic: Alert, oriented  Skin: No acute rashes  Access:  Rt IJ PC/Dr Dew / 5/18       Basic Metabolic Panel:   Recent Labs Lab 04/23/16 1620 04/24/16 0350 04/24/16 1906 04/25/16 0357 04/26/16 0934  NA 128* 132*  --  136 139  K 5.1 5.7* 5.5* 5.6* 4.5  CL 97* 104  --  107 104  CO2 23 21*  --  22 27  GLUCOSE 89 97  --  88 129*  BUN 71* 80*  --  73* 40*  CREATININE 4.26* 4.33*  --  4.13* 2.98*  CALCIUM 8.3* 7.7*  --  7.8* 8.1*  PHOS  --   --   --  5.3*  --      CBC:  Recent Labs Lab 04/23/16 1620 04/24/16 0350 04/27/16 0623 04/29/16 0514  WBC 7.5 6.9 5.8 9.5  HGB 9.5* 8.3* 7.4* 7.6*  HCT 28.9* 25.4* 22.8* 23.0*  MCV 85.5 85.5 84.6 85.3  PLT 295 249 147* 169      Microbiology:  Recent Results (from the past 720 hour(s))  Urine culture     Status: Abnormal   Collection Time: 04/23/16  9:43 PM  Result Value Ref Range Status    Specimen Description URINE, CATHETERIZED  Final   Special Requests NONE  Final   Culture >=100,000 COLONIES/mL VIRIDANS STREPTOCOCCUS (A)  Final   Report Status 04/27/2016 FINAL  Final  MRSA PCR Screening     Status: None   Collection Time: 04/25/16  6:45 AM  Result Value Ref Range Status   MRSA by PCR NEGATIVE NEGATIVE Final    Comment:        The GeneXpert MRSA Assay (FDA approved for NASAL specimens only), is one component of a comprehensive MRSA colonization surveillance program. It is not intended to diagnose MRSA infection nor to guide or monitor treatment for MRSA infections.     Coagulation Studies: No results for input(s): LABPROT, INR in the last 72 hours.  Urinalysis: No results for input(s): COLORURINE, LABSPEC, PHURINE, GLUCOSEU, HGBUR, BILIRUBINUR, KETONESUR, PROTEINUR, UROBILINOGEN, NITRITE, LEUKOCYTESUR in the last 72 hours.  Invalid input(s): APPERANCEUR    Imaging: No results found.   Medications:     . amLODipine  10 mg Oral Daily  .  atorvastatin  40 mg Oral QHS  . calcium acetate  667 mg Oral TID WC  . cefUROXime  250 mg Oral Daily  . docusate sodium  100 mg Oral BID  . epoetin (EPOGEN/PROCRIT) injection  10,000 Units Intravenous Q T,Th,Sa-HD  . heparin  5,000 Units Subcutaneous Q8H  . hydrALAZINE  25 mg Oral Q8H  . insulin aspart  0-9 Units Subcutaneous TID WC     Assessment/ Plan:  60 y.o. 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  1. ESRD:  Secondary to diabetic nephropathy and hypertension. GI complaints could be uremic symptoms. Serologies neg previous admission, permcath placed and first hemodialysis session 5/18. Completed three hemodialysis treatments.  -no acute indication for dialysis today.  We will plan for dialysis again tomorrow. - D/c planning for [facility]  or [facility]  2. Hypertension:  -  Blood pressure currently 162/54 but has been as low as 134/49.  Continue amlodipine and hydralazine at this time.  Consider ARB as an outpatient.  3. Diabetes Mellitus type II with chronic kidney disease: insulin dependent. Hemoglobin A1c 7.2% - currently on sliding scale insulin.  4. Secondary Hyperparathyroidism: PTH 105 - ontinue PhosLo one tablet by mouth 3 times a day with meals and monitor serum phosphorus.  5. Urinalysis suggesting UTI - patient transitioned to cefuroxime.  6. Anemia with chronic kidney disease: - EPO with treatment, hemoglobin currently 7.6.   LOS: 6 Anita Contreras 5/22/20171:22 PM

## 2016-04-30 LAB — GLUCOSE, CAPILLARY
Glucose-Capillary: 107 mg/dL — ABNORMAL HIGH (ref 65–99)
Glucose-Capillary: 104 mg/dL — ABNORMAL HIGH (ref 65–99)
Glucose-Capillary: 111 mg/dL — ABNORMAL HIGH (ref 65–99)
Glucose-Capillary: 246 mg/dL — ABNORMAL HIGH (ref 65–99)

## 2016-04-30 MED ORDER — LOSARTAN POTASSIUM 50 MG PO TABS
50.0000 mg | ORAL_TABLET | Freq: Every day | ORAL | Status: DC
  Administered 2016-04-30 – 2016-05-06 (×7): 50 mg via ORAL
  Filled 2016-04-30 (×9): qty 1

## 2016-04-30 MED ORDER — TUBERCULIN PPD 5 UNIT/0.1ML ID SOLN
5.0000 [IU] | Freq: Once | INTRADERMAL | Status: DC
Start: 2016-04-30 — End: 2016-05-07
  Filled 2016-04-30: qty 0.1

## 2016-04-30 NOTE — Progress Notes (Signed)
Pre-hd tx 

## 2016-04-30 NOTE — Progress Notes (Signed)
Subjective:  Patient seen and evaluated during hemodialysis. She appears to be tolerating quite well. Outpatient placement for dialysis is ongoing.   Objective:  Vital signs in last 24 hours:  Temp:  [98 F (36.7 C)-98.7 F (37.1 C)] 98.7 F (37.1 C) (05/23 1000) Pulse Rate:  [70-76] 71 (05/23 1200) Resp:  [14-20] 15 (05/23 1200) BP: (152-174)/(52-64) 170/56 mmHg (05/23 1130) SpO2:  [100 %] 100 % (05/23 1200) Weight:  [52.7 kg (116 lb 2.9 oz)] 52.7 kg (116 lb 2.9 oz) (05/23 1000)  Weight change:  Filed Weights   04/27/16 1000 04/27/16 1339 04/30/16 1000  Weight: 53.4 kg (117 lb 11.6 oz) 51.9 kg (114 lb 6.7 oz) 52.7 kg (116 lb 2.9 oz)    Intake/Output:    Intake/Output Summary (Last 24 hours) at 04/30/16 1215 Last data filed at 04/30/16 0700  Gross per 24 hour  Intake      0 ml  Output      0 ml  Net      0 ml     Physical Exam: General: Laying in the bed, no acute distress  HEENT Anicteric, moist oral mucous membranes  Neck supple  Pulm/lungs Normal effort, room air, clear to auscultation  CVS/Heart Regular no rubs  Abdomen:  Soft, nontender, nondistended  Extremities: No peripheral edema  Neurologic: Alert, oriented, follows commands  Skin: No acute rashes  Access:  Rt IJ PC/Dr Dew / 5/18       Basic Metabolic Panel:   Recent Labs Lab 04/23/16 1620 04/24/16 0350 04/24/16 1906 04/25/16 0357 04/26/16 0934  NA 128* 132*  --  136 139  K 5.1 5.7* 5.5* 5.6* 4.5  CL 97* 104  --  107 104  CO2 23 21*  --  22 27  GLUCOSE 89 97  --  88 129*  BUN 71* 80*  --  73* 40*  CREATININE 4.26* 4.33*  --  4.13* 2.98*  CALCIUM 8.3* 7.7*  --  7.8* 8.1*  PHOS  --   --   --  5.3*  --      CBC:  Recent Labs Lab 04/23/16 1620 04/24/16 0350 04/27/16 0623 04/29/16 0514  WBC 7.5 6.9 5.8 9.5  HGB 9.5* 8.3* 7.4* 7.6*  HCT 28.9* 25.4* 22.8* 23.0*  MCV 85.5 85.5 84.6 85.3  PLT 295 249 147* 169      Microbiology:  Recent Results (from the past 720 hour(s))   Urine culture     Status: Abnormal   Collection Time: 04/23/16  9:43 PM  Result Value Ref Range Status   Specimen Description URINE, CATHETERIZED  Final   Special Requests NONE  Final   Culture >=100,000 COLONIES/mL VIRIDANS STREPTOCOCCUS (A)  Final   Report Status 04/27/2016 FINAL  Final  MRSA PCR Screening     Status: None   Collection Time: 04/25/16  6:45 AM  Result Value Ref Range Status   MRSA by PCR NEGATIVE NEGATIVE Final    Comment:        The GeneXpert MRSA Assay (FDA approved for NASAL specimens only), is one component of a comprehensive MRSA colonization surveillance program. It is not intended to diagnose MRSA infection nor to guide or monitor treatment for MRSA infections.     Coagulation Studies: No results for input(s): LABPROT, INR in the last 72 hours.  Urinalysis: No results for input(s): COLORURINE, LABSPEC, PHURINE, GLUCOSEU, HGBUR, BILIRUBINUR, KETONESUR, PROTEINUR, UROBILINOGEN, NITRITE, LEUKOCYTESUR in the last 72 hours.  Invalid input(s): APPERANCEUR    Imaging:  Dg Chest 1 View  04/29/2016  CLINICAL DATA:  Cough EXAM: CHEST 1 VIEW COMPARISON:  04/23/2016 chest radiograph. FINDINGS: Right internal jugular central venous catheter terminates in the right atrium. Stable cardiomediastinal silhouette with mild cardiomegaly. No pneumothorax. Small bilateral pleural effusions, decreased bilaterally. Mild pulmonary edema, slightly improved. Patchy bibasilar lung opacities, slightly improved bilaterally. IMPRESSION: 1. Mild congestive heart failure, slightly improved . 2. Small bilateral pleural effusions, decreased bilaterally. 3. Patchy bibasilar lung opacities, slightly improved bilaterally, favor atelectasis. Electronically Signed   By: Ilona Sorrel M.D.   On: 04/29/2016 14:41     Medications:     . amLODipine  10 mg Oral Daily  . atorvastatin  40 mg Oral QHS  . calcium acetate  667 mg Oral TID WC  . cefUROXime  250 mg Oral Daily  . docusate sodium   100 mg Oral BID  . epoetin (EPOGEN/PROCRIT) injection  10,000 Units Intravenous Q T,Th,Sa-HD  . heparin  5,000 Units Subcutaneous Q8H  . hydrALAZINE  25 mg Oral Q8H  . insulin aspart  0-9 Units Subcutaneous TID WC     Assessment/ Plan:  60 y.o. 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  1. ESRD:  Secondary to diabetic nephropathy and hypertension. GI complaints could be uremic symptoms. Serologies neg previous admission, permcath placed and first hemodialysis session 5/18. Completed three hemodialysis treatments.  - Patient seen and evaluated during hemodialysis today. Ultrafiltration target 1.5 kg. -- D/c planning for [facility] or [facility]  2. Hypertension:  -  Blood pressure currently 170/56.  Continue amlodipine and hydralazine at this time.  We will add losartan 50 mg by mouth daily.  3. Diabetes Mellitus type II with chronic kidney disease: insulin dependent. Hemoglobin A1c 7.2% - currently on sliding scale insulin.  4. Secondary Hyperparathyroidism: PTH 105 - continue PhosLo one tablet by mouth 3 times a day with meals and monitor serum phosphorus.  5. Urinalysis suggesting UTI - continue cefuroxime.  6. Anemia with chronic kidney disease: - Continue Epogen 10,000 units IV with dialysis.   LOS: 7 Anita Contreras 5/23/201712:15 PM

## 2016-04-30 NOTE — Progress Notes (Signed)
Post hd tx 

## 2016-04-30 NOTE — Progress Notes (Signed)
Hemodialysis completed. 

## 2016-04-30 NOTE — Care Management (Signed)
Barrier to discharge- securing dialysis chair at [facility] ([facility] [place])  Iran Sizer is coordinating

## 2016-04-30 NOTE — Progress Notes (Signed)
This note also relates to the following rows which could not be included: BP - Cannot attach notes to unvalidated device data   Hemodialysis treatment start

## 2016-04-30 NOTE — Progress Notes (Signed)
[place] at [place] NAME: Anita Contreras    MR#:  [id number]  DATE OF BIRTH:  06/10/1956  SUBJECTIVE:  CHIEF COMPLAINT:    Patient here due to acute on chronic renal failure now progressed to ESRD. Pt. Seen at HD and tolerating it well. No complaints.  No further vomiting overnight and no abdominal pain.    Review of Systems  Constitutional: Negative for fever, chills, weight loss, malaise/fatigue and diaphoresis.  HENT: Negative for congestion.   Eyes: Negative for blurred vision and double vision.  Respiratory: Negative for cough, sputum production, shortness of breath and wheezing.   Cardiovascular: Negative for chest pain, palpitations, orthopnea, leg swelling and PND.  Gastrointestinal: Negative for nausea, vomiting, abdominal pain, diarrhea, constipation and blood in stool.  Genitourinary: Negative for urgency, frequency and hematuria.  Musculoskeletal: Negative for falls.  Neurological: Positive for weakness. Negative for dizziness, tremors, focal weakness and headaches.  Endo/Heme/Allergies: Does not bruise/bleed easily.  Psychiatric/Behavioral: Negative for depression. The patient does not have insomnia.     VITAL SIGNS: Blood pressure 176/55, pulse 74, temperature 98.3 F (36.8 C), temperature source Oral, resp. rate 18, height 4\' 5"  (1.346 m), weight 51.2 kg (112 lb 14 oz), last menstrual period 09/19/2001, SpO2 99 %.  PHYSICAL EXAMINATION:   GENERAL:  60 y.o.-year-old patient lying in the bed with no acute distress.  EYES: Pupils equal, round, reactive to light and accommodation. No scleral icterus. Extraocular muscles intact.  HEENT: Head atraumatic, normocephalic. Oropharynx and nasopharynx clear.  NECK:  Supple, no jugular venous distention. No thyroid enlargement, no tenderness.  LUNGS: Normal breath sounds bilaterally, no wheezing, rales,rhonchi or crepitation. No use of accessory muscles of  respiration.  CARDIOVASCULAR: S1, S2 normal. No murmurs, rubs, or gallops.  ABDOMEN: Soft, nontender, nondistended. Bowel sounds present. No organomegaly or mass.  EXTREMITIES: No pedal edema, cyanosis, or clubbing. NEUROLOGIC: Cranial nerves II through XII are intact. No focal motor or sensory deficits appreciated bilaterally. PSYCHIATRIC: The patient is alert and oriented x 3.  SKIN: No obvious rash, lesion, or ulcer.   Right chest dialysis catheter in place.  ORDERS/RESULTS REVIEWED:   CBC  Recent Labs Lab 04/23/16 1620 04/24/16 0350 04/27/16 0623 04/29/16 0514  WBC 7.5 6.9 5.8 9.5  HGB 9.5* 8.3* 7.4* 7.6*  HCT 28.9* 25.4* 22.8* 23.0*  PLT 295 249 147* 169  MCV 85.5 85.5 84.6 85.3  MCH 28.2 27.9 27.3 28.1  MCHC 33.0 32.7 32.3 33.0  RDW 16.3* 16.0* 16.3* 16.2*   ------------------------------------------------------------------------------------------------------------------  Chemistries   Recent Labs Lab 04/23/16 1620 04/24/16 0350 04/24/16 1906 04/25/16 0357 04/26/16 0934  NA 128* 132*  --  136 139  K 5.1 5.7* 5.5* 5.6* 4.5  CL 97* 104  --  107 104  CO2 23 21*  --  22 27  GLUCOSE 89 97  --  88 129*  BUN 71* 80*  --  73* 40*  CREATININE 4.26* 4.33*  --  4.13* 2.98*  CALCIUM 8.3* 7.7*  --  7.8* 8.1*   ------------------------------------------------------------------------------------------------------------------ estimated creatinine clearance is 12.1 mL/min (by C-G formula based on Cr of 2.98). ------------------------------------------------------------------------------------------------------------------ No results for input(s): TSH, T4TOTAL, T3FREE, THYROIDAB in the last 72 hours.  Invalid input(s): FREET3  Cardiac Enzymes No results for input(s): CKMB, TROPONINI, MYOGLOBIN in the last 168 hours.  Invalid input(s): CK ------------------------------------------------------------------------------------------------------------------ Invalid input(s):  POCBNP ---------------------------------------------------------------------------------------------------------------  RADIOLOGY: Dg Chest 1 View  04/29/2016  CLINICAL DATA:  Cough EXAM: CHEST  1 VIEW COMPARISON:  04/23/2016 chest radiograph. FINDINGS: Right internal jugular central venous catheter terminates in the right atrium. Stable cardiomediastinal silhouette with mild cardiomegaly. No pneumothorax. Small bilateral pleural effusions, decreased bilaterally. Mild pulmonary edema, slightly improved. Patchy bibasilar lung opacities, slightly improved bilaterally. IMPRESSION: 1. Mild congestive heart failure, slightly improved . 2. Small bilateral pleural effusions, decreased bilaterally. 3. Patchy bibasilar lung opacities, slightly improved bilaterally, favor atelectasis. Electronically Signed   By: Ilona Sorrel M.D.   On: 04/29/2016 14:41    ASSESSMENT AND PLAN:  Principal Problem:   Acute on chronic renal failure (HCC)  #1. Acute on chronic renal failure with known history of CKD stage III - did not improve w/ conventional treatment and progressed to ESRD on HD.  Likely cause of CKD is diabetic Nephropathy. - tolerating HD today.   Planning for outpatient HD placement in [place].  -Continue care as per nephrology.   #2. Essential hypertension-stable continue hydralazine, Norvasc.  #3. Acute CHF, diastolic,Echocardiogram done 10th of May 2017 revealed ejection fraction of 50%, with mild aortic regurgitation, moderate mitral valve regurgitation, severe tricuspid valve regurgitation  -Improved with hemodialysis and will monitor. CXR yesterday showing mild CHF.   #4 Vomiting - resolved.  No acute issue  #5 urinary tract infection - urine culture growing Strep. Viridans.  - cont. To finish Ceftin course.   #6. Dizziness-was due to some mild hypotension and now resolved.  #7. Anemia of chronic disease-hemoglobin stable. No acute need for transfusion.  - will monitor.   #8. Diabetes  mellitus type 2-blood sugar stable. Continue sliding scale insulin.  #9 Hyperkalemia - resolved - was on Veltassa, now started on hemodialysis and stable.   #10 Constipation - resolved with lactulose  Awaiting outpatient placement for HD prior to discharge.   DRUG ALLERGIES: No Known Allergies  CODE STATUS:     Code Status Orders        Start     Ordered   04/23/16 2030  Full code   Continuous     04/23/16 2029    Code Status History    Date Active Date Inactive Code Status Order ID Comments User Context   04/15/2016  7:18 PM 04/19/2016  4:08 PM Full Code [id number]  Gladstone Lighter, MD Inpatient      TOTAL TIME TAKING CARE OF THIS PATIENT: 25 minutes.    Henreitta Leber M.D on 04/30/2016 at 3:18 PM  Between 7am to 6pm - Pager - [phone]  After 6pm go to [email or web address] - password [email or web address] [facility]  Office  [phone]  CC: Primary care physician; WHITE, Arlie Solomons, FNP

## 2016-05-01 LAB — GLUCOSE, CAPILLARY
GLUCOSE-CAPILLARY: 109 mg/dL — AB (ref 65–99)
GLUCOSE-CAPILLARY: 214 mg/dL — AB (ref 65–99)
Glucose-Capillary: 221 mg/dL — ABNORMAL HIGH (ref 65–99)
Glucose-Capillary: 163 mg/dL — ABNORMAL HIGH (ref 65–99)
Glucose-Capillary: 152 mg/dL — ABNORMAL HIGH (ref 65–99)

## 2016-05-01 MED ORDER — NEPRO/CARBSTEADY PO LIQD
237.0000 mL | Freq: Two times a day (BID) | ORAL | Status: DC
Start: 2016-05-01 — End: 2016-05-07
  Administered 2016-05-01 – 2016-05-06 (×6): 237 mL via ORAL

## 2016-05-01 MED ORDER — SENNOSIDES-DOCUSATE SODIUM 8.6-50 MG PO TABS
1.0000 | ORAL_TABLET | Freq: Two times a day (BID) | ORAL | Status: DC
  Administered 2016-05-01 – 2016-05-03 (×4): 1 via ORAL
  Filled 2016-05-01 (×4): qty 1

## 2016-05-01 NOTE — Progress Notes (Signed)
Report passed onto [place], Therapist, sports. Pt resting in recliner. Continue to assess.

## 2016-05-01 NOTE — Progress Notes (Signed)
[place] at [place] NAME: Anita Contreras    MR#:  [id number]  DATE OF BIRTH:  Aug 10, 1956  SUBJECTIVE:  CHIEF COMPLAINT:    Patient here due to acute on chronic renal failure and now progressed to ESRD. Pt. Seen with help of spanish interpreter.  No complaints presently.    Review of Systems  Constitutional: Negative for fever, chills, weight loss, malaise/fatigue and diaphoresis.  HENT: Negative for congestion.   Eyes: Negative for blurred vision and double vision.  Respiratory: Negative for cough, sputum production, shortness of breath and wheezing.   Cardiovascular: Negative for chest pain, palpitations, orthopnea, leg swelling and PND.  Gastrointestinal: Negative for nausea, vomiting, abdominal pain, diarrhea, constipation and blood in stool.  Genitourinary: Negative for urgency, frequency and hematuria.  Musculoskeletal: Negative for falls.  Neurological: Positive for weakness. Negative for dizziness, tremors, focal weakness and headaches.  Endo/Heme/Allergies: Does not bruise/bleed easily.  Psychiatric/Behavioral: Negative for depression. The patient does not have insomnia.     VITAL SIGNS: Blood pressure 137/49, pulse 76, temperature 98.2 F (36.8 C), temperature source Oral, resp. rate 20, height 4\' 5"  (1.346 m), weight 51.2 kg (112 lb 14 oz), last menstrual period 09/19/2001, SpO2 92 %.  PHYSICAL EXAMINATION:   GENERAL:  60 y.o.-year-old patient lying in the bed with no acute distress.  EYES: Pupils equal, round, reactive to light and accommodation. No scleral icterus. Extraocular muscles intact.  HEENT: Head atraumatic, normocephalic. Oropharynx and nasopharynx clear.  NECK:  Supple, no jugular venous distention. No thyroid enlargement, no tenderness.  LUNGS: Normal breath sounds bilaterally, minimal end-exp. Wheezing b/l, no rales, rhonchi. No use of accessory muscles of respiration.  CARDIOVASCULAR: S1, S2  normal. No murmurs, rubs, or gallops.  ABDOMEN: Soft, nontender, nondistended. Bowel sounds present. No organomegaly or mass.  EXTREMITIES: No pedal edema, cyanosis, or clubbing. NEUROLOGIC: Cranial nerves II through XII are intact. No focal motor or sensory deficits appreciated bilaterally. PSYCHIATRIC: The patient is alert and oriented x 3.  SKIN: No obvious rash, lesion, or ulcer.   Right chest dialysis catheter in place.  ORDERS/RESULTS REVIEWED:   CBC  Recent Labs Lab 04/27/16 0623 04/29/16 0514  WBC 5.8 9.5  HGB 7.4* 7.6*  HCT 22.8* 23.0*  PLT 147* 169  MCV 84.6 85.3  MCH 27.3 28.1  MCHC 32.3 33.0  RDW 16.3* 16.2*   ------------------------------------------------------------------------------------------------------------------  Chemistries   Recent Labs Lab 04/24/16 1906 04/25/16 0357 04/26/16 0934  NA  --  136 139  K 5.5* 5.6* 4.5  CL  --  107 104  CO2  --  22 27  GLUCOSE  --  88 129*  BUN  --  73* 40*  CREATININE  --  4.13* 2.98*  CALCIUM  --  7.8* 8.1*   ------------------------------------------------------------------------------------------------------------------ estimated creatinine clearance is 12.1 mL/min (by C-G formula based on Cr of 2.98). ------------------------------------------------------------------------------------------------------------------ No results for input(s): TSH, T4TOTAL, T3FREE, THYROIDAB in the last 72 hours.  Invalid input(s): FREET3  Cardiac Enzymes No results for input(s): CKMB, TROPONINI, MYOGLOBIN in the last 168 hours.  Invalid input(s): CK ------------------------------------------------------------------------------------------------------------------ Invalid input(s): POCBNP ---------------------------------------------------------------------------------------------------------------  RADIOLOGY: No results found.  ASSESSMENT AND PLAN:  Principal Problem:   Acute on chronic renal failure (HCC)  #1.  Acute on chronic renal failure with known history of CKD stage III - did not improve w/ conventional treatment and progressed to ESRD on HD.  Likely cause of CKD is underlying diabetic Nephropathy. - tolerating HD today.    -  appreciate Nephro input and pt. Has a spot at [facility] starting Tuesday of next week.  Pt. Will get HD here on Thursday, Saturday and then discharge after that.   #2. Essential hypertension-stable continue hydralazine, Norvasc.  #3. Acute CHF, diastolic,Echocardiogram done 10th of May 2017 revealed ejection fraction of 50%, with mild aortic regurgitation, moderate mitral valve regurgitation, severe tricuspid valve regurgitation  -Improved with hemodialysis and will cont. To monitor.   #4 Vomiting - resolved.  No acute issue  #5 urinary tract infection - urine culture growing Strep. Viridans.  - cont. To finish Ceftin and last day today.   #6. Dizziness-was due to some mild hypotension and now resolved.  #7. Anemia of chronic disease-hemoglobin stable. No acute need for transfusion.  - will monitor.   #8. Diabetes mellitus type 2-blood sugar stable. Continue sliding scale insulin.  #9 Hyperkalemia - resolved - was on Veltassa, now on hemodialysis and stable.   #10 Constipation - resolved with lactulose   DRUG ALLERGIES: No Known Allergies  CODE STATUS:     Code Status Orders        Start     Ordered   04/23/16 2030  Full code   Continuous     04/23/16 2029    Code Status History    Date Active Date Inactive Code Status Order ID Comments User Context   04/15/2016  7:18 PM 04/19/2016  4:08 PM Full Code [id number]  Gladstone Lighter, MD Inpatient      TOTAL TIME TAKING CARE OF THIS PATIENT: 25 minutes.    Henreitta Leber M.D on 05/01/2016 at 2:44 PM  Between 7am to 6pm - Pager - [phone]  After 6pm go to [email or web address] - password [email or web address] [facility]  Office  [phone]  CC: Primary care physician; WHITE, Arlie Solomons,  FNP

## 2016-05-01 NOTE — Progress Notes (Signed)
Subjective:  Patient seenat bedside today. She tolerated dialysis quite well yesterday. It appears that outpatient dialysis seat has been securedfor next Tuesday morning.  Objective:  Vital signs in last 24 hours:  Temp:  [98 F (36.7 C)-98.2 F (36.8 C)] 98.2 F (36.8 C) (05/24 0448) Pulse Rate:  [71-82] 76 (05/24 1339) Resp:  [20] 20 (05/24 1339) BP: (132-166)/(45-61) 137/49 mmHg (05/24 1339) SpO2:  [92 %-99 %] 92 % (05/24 1339)  Weight change:  Filed Weights   04/27/16 1339 04/30/16 1000 04/30/16 1358  Weight: 51.9 kg (114 lb 6.7 oz) 52.7 kg (116 lb 2.9 oz) 51.2 kg (112 lb 14 oz)    Intake/Output:    Intake/Output Summary (Last 24 hours) at 05/01/16 1450 Last data filed at 05/01/16 1300  Gross per 24 hour  Intake    340 ml  Output      0 ml  Net    340 ml     Physical Exam: General: Laying in the bed, no acute distress  HEENT Anicteric, moist oral mucous membranes  Neck supple  Pulm/lungs Normal effort, room air, clear to auscultation  CVS/Heart Regular no rubs  Abdomen:  Soft, nontender, nondistended  Extremities: No peripheral edema  Neurologic: Alert, oriented, follows commands  Skin: No acute rashes  Access:  Rt IJ PC/Dr Dew / 5/18       Basic Metabolic Panel:   Recent Labs Lab 04/24/16 1906 04/25/16 0357 04/26/16 0934  NA  --  136 139  K 5.5* 5.6* 4.5  CL  --  107 104  CO2  --  22 27  GLUCOSE  --  88 129*  BUN  --  73* 40*  CREATININE  --  4.13* 2.98*  CALCIUM  --  7.8* 8.1*  PHOS  --  5.3*  --      CBC:  Recent Labs Lab 04/27/16 0623 04/29/16 0514  WBC 5.8 9.5  HGB 7.4* 7.6*  HCT 22.8* 23.0*  MCV 84.6 85.3  PLT 147* 169      Microbiology:  Recent Results (from the past 720 hour(s))  Urine culture     Status: Abnormal   Collection Time: 04/23/16  9:43 PM  Result Value Ref Range Status   Specimen Description URINE, CATHETERIZED  Final   Special Requests NONE  Final   Culture >=100,000 COLONIES/mL VIRIDANS  STREPTOCOCCUS (A)  Final   Report Status 04/27/2016 FINAL  Final  MRSA PCR Screening     Status: None   Collection Time: 04/25/16  6:45 AM  Result Value Ref Range Status   MRSA by PCR NEGATIVE NEGATIVE Final    Comment:        The GeneXpert MRSA Assay (FDA approved for NASAL specimens only), is one component of a comprehensive MRSA colonization surveillance program. It is not intended to diagnose MRSA infection nor to guide or monitor treatment for MRSA infections.     Coagulation Studies: No results for input(s): LABPROT, INR in the last 72 hours.  Urinalysis: No results for input(s): COLORURINE, LABSPEC, PHURINE, GLUCOSEU, HGBUR, BILIRUBINUR, KETONESUR, PROTEINUR, UROBILINOGEN, NITRITE, LEUKOCYTESUR in the last 72 hours.  Invalid input(s): APPERANCEUR    Imaging: No results found.   Medications:     . amLODipine  10 mg Oral Daily  . atorvastatin  40 mg Oral QHS  . calcium acetate  667 mg Oral TID WC  . cefUROXime  250 mg Oral Daily  . epoetin (EPOGEN/PROCRIT) injection  10,000 Units Intravenous Q T,Th,Sa-HD  . feeding  supplement (NEPRO CARB STEADY)  237 mL Oral BID BM  . heparin  5,000 Units Subcutaneous Q8H  . hydrALAZINE  25 mg Oral Q8H  . insulin aspart  0-9 Units Subcutaneous TID WC  . losartan  50 mg Oral Daily  . senna-docusate  1 tablet Oral BID  . tuberculin  5 Units Intradermal Once     Assessment/ Plan:  60 y.o. 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  1. ESRD:  Secondary to diabetic nephropathy and hypertension. GI complaints could be uremic symptoms. Serologies neg previous admission, permcath placed and first hemodialysis session 5/18. Completed three hemodialysis treatments.  - patient completed dialysis yesterday.  She will be due for dialysis again tomorrow.  We will prepare  orders. -- D/c planning for [facility], outpt seat secured for next Tuesday, will need HD here on Saturday.  2. Hypertension:  -  Blood pressure 137/49.  Continue amlodipine, hydralazine, and losartan.  3. Diabetes Mellitus type II with chronic kidney disease: insulin dependent. Hemoglobin A1c 7.2% - currently on sliding scale insulin.  4. Secondary Hyperparathyroidism: PTH 105 - continue PhosLo one tablet by mouth 3 times a day with meals and monitor serum phosphorus.  5. Urinalysis suggesting UTI - continue cefuroxime.  6. Anemia with chronic kidney disease: - Continue Epogen 10,000 units IV with dialysis while here.  Pt will need ESAs as outpt as well. Current hemoglobin 7.6.   LOS: 8 Celeste Tavenner 5/24/20172:50 PM

## 2016-05-01 NOTE — Care Management (Signed)
Informed by Maudie Mercury riddle that [facility] in [place] has requested an additional test (quatam gold)  to determine if patient has had previous episode of TB.  Dr Holley Raring is to enter the order.  It will take at least 24 hours to result the test.  Patient will have chair on Tues  Thurs and Sats.  Clinic is choosing not to start the patient on a Saturday due to the language barrier.  First treatment Tues May 30 at 5:45A.  Maudie Mercury  will relay dialysis information to patient on 5/25.

## 2016-05-01 NOTE — Care Management Note (Signed)
Patient has a tentative schedule at [facility].  TTS 1st shift. 1st treatment is scheduled for Tuesday May 30 at 5:45 am.  They have requested additional lab work to rule out TB.  I have notified Dr. Holley Raring of the request and he has entered orders.  Results should take appox 24 hours.  The reason for delaying start till 30th is because of lack Spanish interrupter in the center on Saturday and the the pending results.  Iran Sizer  Dialysis Coordinator   [phone]

## 2016-05-01 NOTE — Progress Notes (Signed)
Nutrition Follow-up  DOCUMENTATION CODES:   Not applicable  INTERVENTION:  -Monitor intake. Do not recommend further diet restrictions (renal diet) at this time as intake poor -Renal diet education not appropriate at this time -Recommend adding nepro BID for added nutrition between meals. Pt agreeable -Spoke with RN, Zaneta regarding pt compliant of nausea, vomiting at 3am and limited intake. Zofran ordered prn and RN to touch base with pt to control nausea so pt will eat. Encouraged pt via interpreter to call out for RN when feeling nauseated.   NUTRITION DIAGNOSIS:   Inadequate oral intake related to acute illness as evidenced by per patient/family report.  ongoing  GOAL:   Patient will meet greater than or equal to 90% of their needs  Not meeting nutritional needs at this time  MONITOR:   PO intake, Labs  REASON FOR ASSESSMENT:   Malnutrition Screening Tool    ASSESSMENT:   60 y/o female admitted with acute renal failure, hyperkalemia, bradycardia. Planning permacath placement on 5/18   Spoke with pt via interpreter.  Pt reports nausea and vomiting at 3am this morning.  Reports only able to take a few bites of meals during admission secondary to this.  Reports that she is asking RN for medication to help control nausea.  This am only ate few bites of oatmeal, reports lunch and dinner meals not eating much more.    Medications reviewed: phoslo, colace, aspart, zofran prn Labs reviewed: FSBS 109, 246, 111, 104, 107  Noted 1547ml fluid removed with HD yesterday  Diet Order:  Diet Carb Modified Fluid consistency:: Thin; Room service appropriate?: Yes  Skin:  Reviewed, no issues  Last BM:  5/21  Height:   Ht Readings from Last 1 Encounters:  04/24/16 4\' 5"  (1.346 m)    Weight: On RD's visit on 5/17 measured bed wt and found 126 pounds.  Performing HD and wt change effected by fluid being removed via HD  Wt Readings from Last 1 Encounters:  04/30/16 112 lb 14 oz  (51.2 kg)    Ideal Body Weight:     BMI:  Body mass index is 28.26 kg/(m^2).  Estimated Nutritional Needs:   Kcal:  [id number] kcals/d  Protein:  63-71 g/d  Fluid:  1.2-1.4 L/d  EDUCATION NEEDS:   Education needs no appropriate at this time  Javion Holmer B. Zenia Resides, [place], [place] (pager) Weekend/On-Call pager [phone])

## 2016-05-02 LAB — CBC
HCT: 23.5 % — ABNORMAL LOW (ref 35.0–47.0)
HEMOGLOBIN: 7.6 g/dL — AB (ref 12.0–16.0)
MCH: 27.6 pg (ref 26.0–34.0)
MCHC: 32.5 g/dL (ref 32.0–36.0)
MCV: 84.9 fL (ref 80.0–100.0)
PLATELETS: 197 10*3/uL (ref 150–440)
RBC: 2.77 MIL/uL — ABNORMAL LOW (ref 3.80–5.20)
RDW: 15.7 % — AB (ref 11.5–14.5)
WBC: 9.2 10*3/uL (ref 3.6–11.0)

## 2016-05-02 LAB — GLUCOSE, CAPILLARY
GLUCOSE-CAPILLARY: 106 mg/dL — AB (ref 65–99)
GLUCOSE-CAPILLARY: 221 mg/dL — AB (ref 65–99)
Glucose-Capillary: 162 mg/dL — ABNORMAL HIGH (ref 65–99)

## 2016-05-02 MED ORDER — HYDRALAZINE HCL 50 MG PO TABS
50.0000 mg | ORAL_TABLET | Freq: Three times a day (TID) | ORAL | Status: DC
Start: 2016-05-02 — End: 2016-05-07
  Administered 2016-05-02 – 2016-05-07 (×14): 50 mg via ORAL
  Filled 2016-05-02 (×15): qty 1

## 2016-05-02 NOTE — Progress Notes (Signed)
Pre Dialysis 

## 2016-05-02 NOTE — Progress Notes (Signed)
Post dialysis 

## 2016-05-02 NOTE — Progress Notes (Signed)
Subjective:  Patient seen and evaluated during hemodialysis. She appears to be tolerating this well. Her outpatient dialysis is set for next Tuesday morning.  Objective:  Vital signs in last 24 hours:  Temp:  [98.1 F (36.7 C)-99.1 F (37.3 C)] 98.4 F (36.9 C) (05/25 1000) Pulse Rate:  [69-77] 73 (05/25 1015) Resp:  [14-20] 14 (05/25 1015) BP: (137-174)/(49-63) 174/63 mmHg (05/25 1015) SpO2:  [92 %-100 %] 100 % (05/25 1015) Weight:  [53.7 kg (118 lb 6.2 oz)] 53.7 kg (118 lb 6.2 oz) (05/25 1000)  Weight change:  Filed Weights   04/30/16 1000 04/30/16 1358 05/02/16 1000  Weight: 52.7 kg (116 lb 2.9 oz) 51.2 kg (112 lb 14 oz) 53.7 kg (118 lb 6.2 oz)    Intake/Output:    Intake/Output Summary (Last 24 hours) at 05/02/16 1037 Last data filed at 05/02/16 0955  Gross per 24 hour  Intake    475 ml  Output    200 ml  Net    275 ml     Physical Exam: General: Laying in the bed, no acute distress  HEENT Anicteric, moist oral mucous membranes  Neck supple  Pulm/lungs Normal effort, room air, clear to auscultation  CVS/Heart Regular no rubs  Abdomen:  Soft, nontender, nondistended  Extremities: No peripheral edema  Neurologic: Alert, oriented, follows commands  Skin: No acute rashes  Access:  Rt IJ PC/Dr Dew / 5/18       Basic Metabolic Panel:   Recent Labs Lab 04/26/16 0934  NA 139  K 4.5  CL 104  CO2 27  GLUCOSE 129*  BUN 40*  CREATININE 2.98*  CALCIUM 8.1*     CBC:  Recent Labs Lab 04/27/16 0623 04/29/16 0514 05/02/16 0312  WBC 5.8 9.5 9.2  HGB 7.4* 7.6* 7.6*  HCT 22.8* 23.0* 23.5*  MCV 84.6 85.3 84.9  PLT 147* 169 197      Microbiology:  Recent Results (from the past 720 hour(s))  Urine culture     Status: Abnormal   Collection Time: 04/23/16  9:43 PM  Result Value Ref Range Status   Specimen Description URINE, CATHETERIZED  Final   Special Requests NONE  Final   Culture >=100,000 COLONIES/mL VIRIDANS STREPTOCOCCUS (A)  Final   Report Status 04/27/2016 FINAL  Final  MRSA PCR Screening     Status: None   Collection Time: 04/25/16  6:45 AM  Result Value Ref Range Status   MRSA by PCR NEGATIVE NEGATIVE Final    Comment:        The GeneXpert MRSA Assay (FDA approved for NASAL specimens only), is one component of a comprehensive MRSA colonization surveillance program. It is not intended to diagnose MRSA infection nor to guide or monitor treatment for MRSA infections.     Coagulation Studies: No results for input(s): LABPROT, INR in the last 72 hours.  Urinalysis: No results for input(s): COLORURINE, LABSPEC, PHURINE, GLUCOSEU, HGBUR, BILIRUBINUR, KETONESUR, PROTEINUR, UROBILINOGEN, NITRITE, LEUKOCYTESUR in the last 72 hours.  Invalid input(s): APPERANCEUR    Imaging: No results found.   Medications:     . amLODipine  10 mg Oral Daily  . atorvastatin  40 mg Oral QHS  . calcium acetate  667 mg Oral TID WC  . epoetin (EPOGEN/PROCRIT) injection  10,000 Units Intravenous Q T,Th,Sa-HD  . feeding supplement (NEPRO CARB STEADY)  237 mL Oral BID BM  . heparin  5,000 Units Subcutaneous Q8H  . hydrALAZINE  25 mg Oral Q8H  . insulin aspart  0-9 Units Subcutaneous TID WC  . losartan  50 mg Oral Daily  . senna-docusate  1 tablet Oral BID  . tuberculin  5 Units Intradermal Once     Assessment/ Plan:  60 y.o. 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  1. ESRD:  Secondary to diabetic nephropathy and hypertension. GI complaints could be uremic symptoms. Serologies neg previous admission, permcath placed and first hemodialysis session 5/18. Completed three hemodialysis treatments.  - Patient seen and evaluated during dialysis. She appears to be tolerating quite well. -- D/c planning for [facility], outpt seat secured for next  Tuesday, will need HD here on Saturday.  2. Hypertension:  -  Blood pressure 174/63 at the moment.  Continue amlodipine, hydralazine, and losartan.   Ultrafiltration should also help to lower the blood pressure.  3. Diabetes Mellitus type II with chronic kidney disease: insulin dependent. Hemoglobin A1c 7.2% - currently on sliding scale insulin.  4. Secondary Hyperparathyroidism: PTH 105 - continue PhosLo one tablet by mouth 3 times a day with meals and monitor serum phosphorus.  5. Urinalysis suggesting UTI - Patient has completed therapy with cefuroxime.  6. Anemia with chronic kidney disease: - Hemoglobin 7.6 today. Continue Epogen 10,000 units IV with dialysis during admission. She will need additional therapy as an outpatient under the instruction of [facility] nephrology.   LOS: 9 Anita Contreras 5/25/201710:37 AM

## 2016-05-02 NOTE — Care Management (Signed)
Spoke with Dr Holley Raring and he stated patient could discharge home Saturday after her dialysis.  It is not an option for patient to receive half treatment on Friday and or skip Saturday.  [employer] and confirmed  receipt of specimen.  Informed it will take at least 4 days to result.  Informed that dialysis clinic will not accept patient until receive this result.  Discharge could be delayed until at least Monday May 29

## 2016-05-02 NOTE — Progress Notes (Signed)
Dialysis complete

## 2016-05-02 NOTE — Progress Notes (Signed)
[place] [facility] [place] NAME: Anita Contreras    MR#:  [id number]  DATE OF BIRTH:  May 09, 1956  SUBJECTIVE:  CHIEF COMPLAINT:    Patient here due to acute on chronic renal failure and now progressed to ESRD. Pt.    Seen during hemodialysis., No complaints.  Review of Systems  Constitutional: Negative for fever, chills, weight loss, malaise/fatigue and diaphoresis.  HENT: Negative for congestion.   Eyes: Negative for blurred vision and double vision.  Respiratory: Negative for cough, sputum production, shortness of breath and wheezing.   Cardiovascular: Negative for chest pain, palpitations, orthopnea, leg swelling and PND.  Gastrointestinal: Negative for nausea, vomiting, abdominal pain, diarrhea, constipation and blood in stool.  Genitourinary: Negative for urgency, frequency and hematuria.  Musculoskeletal: Negative for falls.  Neurological: Positive for weakness. Negative for dizziness, tremors, focal weakness and headaches.  Endo/Heme/Allergies: Does not bruise/bleed easily.  Psychiatric/Behavioral: Negative for depression. The patient does not have insomnia.     VITAL SIGNS: Blood pressure 178/62, pulse 50, temperature 98.4 F (36.9 C), temperature source Oral, resp. rate 14, height 4\' 5"  (1.346 m), weight 53.7 kg (118 lb 6.2 oz), last menstrual period 09/19/2001, SpO2 97 %.  PHYSICAL EXAMINATION:   GENERAL:  60 y.o.-year-old patient lying in the bed with no acute distress.  EYES: Pupils equal, round, reactive to light and accommodation. No scleral icterus. Extraocular muscles intact.  HEENT: Head atraumatic, normocephalic. Oropharynx and nasopharynx clear.  NECK:  Supple, no jugular venous distention. No thyroid enlargement, no tenderness.  LUNGS: Normal breath sounds bilaterally, minimal end-exp. Wheezing b/l, no rales, rhonchi. No use of accessory muscles of respiration.  CARDIOVASCULAR: S1, S2 normal. No murmurs,  rubs, or gallops.  ABDOMEN: Soft, nontender, nondistended. Bowel sounds present. No organomegaly or mass.  EXTREMITIES: No pedal edema, cyanosis, or clubbing. NEUROLOGIC: Cranial nerves II through XII are intact. No focal motor or sensory deficits appreciated bilaterally. PSYCHIATRIC: The patient is alert and oriented x 3.  SKIN: No obvious rash, lesion, or ulcer.   Right chest dialysis catheter in place.  ORDERS/RESULTS REVIEWED:   CBC  Recent Labs Lab 04/27/16 0623 04/29/16 0514 05/02/16 0312  WBC 5.8 9.5 9.2  HGB 7.4* 7.6* 7.6*  HCT 22.8* 23.0* 23.5*  PLT 147* 169 197  MCV 84.6 85.3 84.9  MCH 27.3 28.1 27.6  MCHC 32.3 33.0 32.5  RDW 16.3* 16.2* 15.7*   ------------------------------------------------------------------------------------------------------------------  Chemistries   Recent Labs Lab 04/26/16 0934  NA 139  K 4.5  CL 104  CO2 27  GLUCOSE 129*  BUN 40*  CREATININE 2.98*  CALCIUM 8.1*   ------------------------------------------------------------------------------------------------------------------ estimated creatinine clearance is 12.4 mL/min (by C-G formula based on Cr of 2.98). ------------------------------------------------------------------------------------------------------------------ No results for input(s): TSH, T4TOTAL, T3FREE, THYROIDAB in the last 72 hours.  Invalid input(s): FREET3  Cardiac Enzymes No results for input(s): CKMB, TROPONINI, MYOGLOBIN in the last 168 hours.  Invalid input(s): CK ------------------------------------------------------------------------------------------------------------------ Invalid input(s): POCBNP ---------------------------------------------------------------------------------------------------------------  RADIOLOGY: No results found.  ASSESSMENT AND PLAN:  Principal Problem:   Acute on chronic renal failure (HCC)  #1. Acute on chronic renal failure with known history of CKD stage III - did  not improve w/ conventional treatment and progressed to ESRD on HD.  Likely cause of CKD is underlying diabetic Nephropathy. - tolerating HD today.    - appreciate Nephro input and pt. Has a spot at [facility] HD starting Tuesday of next week.  Pt. Will get HD here on Thursday,  Saturday and then discharge after that.   #2. Essential hypertension- uncontrolled, increase the hydralazine dose. continue  Losartan,Norvasc.  #3. Acute CHF, diastolic,Echocardiogram done 10th of May 2017 revealed ejection fraction of 50%, with mild aortic regurgitation, moderate mitral valve regurgitation, severe tricuspid valve regurgitation  -Improved with hemodialysis  #4 Vomiting - resolved.  No acute issue  #5 urinary tract infection - urine culture growing Strep. Viridans.  - finished Ceftin.  #6. Dizziness-was due to some mild hypotension and now resolved.  #7. Anemia of chronic disease-hemoglobin stable. No acute need for transfusion.  - will monitor.   #8. Diabetes mellitus type 2-blood sugar stable. Continue sliding scale insulin.  #9 Hyperkalemia - resolved - was on Veltassa, now on hemodialysis and stable.   #10 Constipation - resolved with lactulose   DRUG ALLERGIES: No Known Allergies  CODE STATUS:     Code Status Orders        Start     Ordered   04/23/16 2030  Full code   Continuous     04/23/16 2029    Code Status History    Date Active Date Inactive Code Status Order ID Comments User Context   04/15/2016  7:18 PM 04/19/2016  4:08 PM Full Code [id number]  Gladstone Lighter, MD Inpatient      TOTAL TIME TAKING CARE OF THIS PATIENT: 25 minutes.    Epifanio Lesches M.D on 05/02/2016 at 1:37 PM  Between 7am to 6pm - Pager - [phone]  After 6pm go to [email or web address] - password [email or web address] [facility]  Office  [phone]  CC: Primary care physician; WHITE, Arlie Solomons, FNP

## 2016-05-02 NOTE — Progress Notes (Signed)
Dialysis started 

## 2016-05-03 LAB — GLUCOSE, CAPILLARY
Glucose-Capillary: 118 mg/dL — ABNORMAL HIGH (ref 65–99)
Glucose-Capillary: 167 mg/dL — ABNORMAL HIGH (ref 65–99)
Glucose-Capillary: 178 mg/dL — ABNORMAL HIGH (ref 65–99)
Glucose-Capillary: 192 mg/dL — ABNORMAL HIGH (ref 65–99)

## 2016-05-03 NOTE — Progress Notes (Signed)
[place] [facility] [place] NAME: Anita Contreras    MR#:  [id number]  DATE OF BIRTH:  07/21/1956  SUBJECTIVE:  CHIEF COMPLAINT:    Patient here due to acute on chronic renal failure and now progressed to ESRD.  No new complaints, had a quantiferon and test for TB.   Seen during hemodialysi  Review of Systems  Constitutional: Negative for fever, chills, weight loss, malaise/fatigue and diaphoresis.  HENT: Negative for congestion.   Eyes: Negative for blurred vision and double vision.  Respiratory: Negative for cough, sputum production, shortness of breath and wheezing.   Cardiovascular: Negative for chest pain, palpitations, orthopnea, leg swelling and PND.  Gastrointestinal: Negative for nausea, vomiting, abdominal pain, diarrhea, constipation and blood in stool.  Genitourinary: Negative for urgency, frequency and hematuria.  Musculoskeletal: Negative for falls.  Neurological: Positive for weakness. Negative for dizziness, tremors, focal weakness and headaches.  Endo/Heme/Allergies: Does not bruise/bleed easily.  Psychiatric/Behavioral: Negative for depression. The patient does not have insomnia.     VITAL SIGNS: Blood pressure 132/50, pulse 78, temperature 98.6 F (37 C), temperature source Oral, resp. rate 20, height 4\' 5"  (1.346 m), weight 52.2 kg (115 lb 1.3 oz), last menstrual period 09/19/2001, SpO2 100 %.  PHYSICAL EXAMINATION:   GENERAL:  60 y.o.-year-old patient lying in the bed with no acute distress.  EYES: Pupils equal, round, reactive to light and accommodation. No scleral icterus. Extraocular muscles intact.  HEENT: Head atraumatic, normocephalic. Oropharynx and nasopharynx clear.  NECK:  Supple, no jugular venous distention. No thyroid enlargement, no tenderness.  LUNGS: Normal breath sounds bilaterally, minimal end-exp. Wheezing b/l, no rales, rhonchi. No use of accessory muscles of respiration.   CARDIOVASCULAR: S1, S2 normal. No murmurs, rubs, or gallops.  ABDOMEN: Soft, nontender, nondistended. Bowel sounds present. No organomegaly or mass.  EXTREMITIES: No pedal edema, cyanosis, or clubbing. NEUROLOGIC: Cranial nerves II through XII are intact. No focal motor or sensory deficits appreciated bilaterally. PSYCHIATRIC: The patient is alert and oriented x 3.  SKIN: No obvious rash, lesion, or ulcer.   Right chest dialysis catheter in place.  ORDERS/RESULTS REVIEWED:   CBC  Recent Labs Lab 04/27/16 0623 04/29/16 0514 05/02/16 0312  WBC 5.8 9.5 9.2  HGB 7.4* 7.6* 7.6*  HCT 22.8* 23.0* 23.5*  PLT 147* 169 197  MCV 84.6 85.3 84.9  MCH 27.3 28.1 27.6  MCHC 32.3 33.0 32.5  RDW 16.3* 16.2* 15.7*   ------------------------------------------------------------------------------------------------------------------  Chemistries  No results for input(s): NA, K, CL, CO2, GLUCOSE, BUN, CREATININE, CALCIUM, MG, AST, ALT, ALKPHOS, BILITOT in the last 168 hours.  Invalid input(s): GFRCGP ------------------------------------------------------------------------------------------------------------------ estimated creatinine clearance is 12.2 mL/min (by C-G formula based on Cr of 2.98). ------------------------------------------------------------------------------------------------------------------ No results for input(s): TSH, T4TOTAL, T3FREE, THYROIDAB in the last 72 hours.  Invalid input(s): FREET3  Cardiac Enzymes No results for input(s): CKMB, TROPONINI, MYOGLOBIN in the last 168 hours.  Invalid input(s): CK ------------------------------------------------------------------------------------------------------------------ Invalid input(s): POCBNP ---------------------------------------------------------------------------------------------------------------  RADIOLOGY: No results found.  ASSESSMENT AND PLAN:  Principal Problem:   Acute on chronic renal failure (HCC)  #1.  Acute on chronic renal failure with known history of CKD stage III - did not improve w/ conventional treatment and progressed to ESRD on HD.  Likely cause of CKD is underlying diabetic Nephropathy. - tolerating HD today.    - appreciate Nephro input and pt. Has a spot at [facility] HD starting Tuesday of next week.   Next  dialysis is tomorrow. #  2. Essential hypertension improved.  #3. Acute CHF, diastolic,Echocardiogram done 10th of May 2017 revealed ejection fraction of 50%, with mild aortic regurgitation, moderate mitral valve regurgitation, severe tricuspid valve regurgitation  -Improved with hemodialysis  #4 Vomiting - resolved.  No acute issue  #5 urinary tract infection - urine culture growing Strep. Viridans.  - finished Ceftin.  #6. Dizziness-was due to some mild hypotension and now resolved.  #7. Anemia of chronic disease-hemoglobin stable. No acute need for transfusion.  - will monitor.   #8. Diabetes mellitus type 2-blood sugar stable. Continue sliding scale insulin.  #9 Hyperkalemia - resolved - was on Veltassa, now on hemodialysis and stable.   #10 Constipation - resolved with lactulose   DRUG ALLERGIES: No Known Allergies  CODE STATUS:     Code Status Orders        Start     Ordered   04/23/16 2030  Full code   Continuous     04/23/16 2029    Code Status History    Date Active Date Inactive Code Status Order ID Comments User Context   04/15/2016  7:18 PM 04/19/2016  4:08 PM Full Code [id number]  Gladstone Lighter, MD Inpatient      TOTAL TIME TAKING CARE OF THIS PATIENT: 25 minutes.    Epifanio Lesches M.D on 05/03/2016 at 1:17 PM  Between 7am to 6pm - Pager - [phone]  After 6pm go to [email or web address] - password [email or web address] [place] [facility]  Office  [phone]  CC: Primary care physician; WHITE, Arlie Solomons, FNP

## 2016-05-03 NOTE — Progress Notes (Signed)
Subjective:  Patient completed hemodialysis yesterday. She is due for dialysis again tomorrow. We are awating quanitferon result.  Objective:  Vital signs in last 24 hours:  Temp:  [97.5 F (36.4 C)-100.2 F (37.9 C)] 98.6 F (37 C) (05/26 [id number]) Pulse Rate:  [50-81] 78 (05/26 1050) Resp:  [14-21] 20 (05/26 0614) BP: (132-185)/(46-69) 132/50 mmHg (05/26 1050) SpO2:  [97 %-100 %] 100 % (05/26 0614) Weight:  [52.2 kg (115 lb 1.3 oz)] 52.2 kg (115 lb 1.3 oz) (05/25 1347)  Weight change:  Filed Weights   04/30/16 1358 05/02/16 1000 05/02/16 1347  Weight: 51.2 kg (112 lb 14 oz) 53.7 kg (118 lb 6.2 oz) 52.2 kg (115 lb 1.3 oz)    Intake/Output:    Intake/Output Summary (Last 24 hours) at 05/03/16 1217 Last data filed at 05/03/16 0735  Gross per 24 hour  Intake    500 ml  Output   1700 ml  Net  -1200 ml     Physical Exam: General: Laying in the bed, no acute distress  HEENT Anicteric, moist oral mucous membranes  Neck supple  Pulm/lungs Normal effort, room air, clear to auscultation  CVS/Heart Regular no rubs  Abdomen:  Soft, nontender, nondistended  Extremities: No peripheral edema  Neurologic: Alert, oriented, follows commands  Skin: No acute rashes  Access:  Rt IJ PC/Dr Dew / 5/18       Basic Metabolic Panel:  No results for input(s): NA, K, CL, CO2, GLUCOSE, BUN, CREATININE, CALCIUM, MG, PHOS in the last 168 hours.   CBC:  Recent Labs Lab 04/27/16 0623 04/29/16 0514 05/02/16 0312  WBC 5.8 9.5 9.2  HGB 7.4* 7.6* 7.6*  HCT 22.8* 23.0* 23.5*  MCV 84.6 85.3 84.9  PLT 147* 169 197      Microbiology:  Recent Results (from the past 720 hour(s))  Urine culture     Status: Abnormal   Collection Time: 04/23/16  9:43 PM  Result Value Ref Range Status   Specimen Description URINE, CATHETERIZED  Final   Special Requests NONE  Final   Culture >=100,000 COLONIES/mL VIRIDANS STREPTOCOCCUS (A)  Final   Report Status 04/27/2016 FINAL  Final  MRSA PCR  Screening     Status: None   Collection Time: 04/25/16  6:45 AM  Result Value Ref Range Status   MRSA by PCR NEGATIVE NEGATIVE Final    Comment:        The GeneXpert MRSA Assay (FDA approved for NASAL specimens only), is one component of a comprehensive MRSA colonization surveillance program. It is not intended to diagnose MRSA infection nor to guide or monitor treatment for MRSA infections.     Coagulation Studies: No results for input(s): LABPROT, INR in the last 72 hours.  Urinalysis: No results for input(s): COLORURINE, LABSPEC, PHURINE, GLUCOSEU, HGBUR, BILIRUBINUR, KETONESUR, PROTEINUR, UROBILINOGEN, NITRITE, LEUKOCYTESUR in the last 72 hours.  Invalid input(s): APPERANCEUR    Imaging: No results found.   Medications:     . amLODipine  10 mg Oral Daily  . atorvastatin  40 mg Oral QHS  . calcium acetate  667 mg Oral TID WC  . epoetin (EPOGEN/PROCRIT) injection  10,000 Units Intravenous Q T,Th,Sa-HD  . feeding supplement (NEPRO CARB STEADY)  237 mL Oral BID BM  . heparin  5,000 Units Subcutaneous Q8H  . hydrALAZINE  50 mg Oral Q8H  . insulin aspart  0-9 Units Subcutaneous TID WC  . losartan  50 mg Oral Daily  . senna-docusate  1 tablet Oral BID  .  tuberculin  5 Units Intradermal Once     Assessment/ Plan:  60 y.o. 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  1. ESRD:  Secondary to diabetic nephropathy and hypertension. GI complaints could be uremic symptoms. Serologies neg previous admission, permcath placed and first hemodialysis session 5/18. Completed three hemodialysis treatments.  - patient had hemodialysis yesterday.  No acute indication for dialysis today.  We will prepare dialysis orders for tomorrow. -- D/c planning for [facility], outpt seat secured for next Tuesday,  will need HD here on Saturday.  2. Hypertension:  -  Blood pressure down to 132/50  Continue amlodipine, hydralazine, and losartan.     3. Diabetes Mellitus type II with chronic kidney disease: insulin dependent. Hemoglobin A1c 7.2% - currently on sliding scale insulin.  4. Secondary Hyperparathyroidism: PTH 105 - continue PhosLo one tablet by mouth 3 times a day with meals.  5. Urinalysis suggesting UTI - Patient has completed therapy with cefuroxime.  6. Anemia with chronic kidney disease: - Hemoglobin 7.6 at last check. Continue Epogen 10,000 units IV with dialysis.   LOS: 10 Lotus Santillo 5/26/201712:17 PM

## 2016-05-03 NOTE — Care Management (Addendum)
Quantiferon  test for TB has not resulted today.  Must be negative for TB in order for patient to discharge and be  accepted at [facility] in [place].  Patient will have dialysis at [facility] 5/27.  If test results are received Saturday or after and are  negative for TB,  patient can discharge home after dialysis on Saturday or on the day in which negative results are received.  Patient has been instructed on dialysis dates and times by [place] [employer].  Med management Clinic provided assist with meds at last discharge 5/12.  At present there does not appear to be any new meds.

## 2016-05-04 LAB — RENAL FUNCTION PANEL
Albumin: 2.6 g/dL — ABNORMAL LOW (ref 3.5–5.0)
Anion gap: 4 — ABNORMAL LOW (ref 5–15)
BUN: 14 mg/dL (ref 6–20)
CHLORIDE: 99 mmol/L — AB (ref 101–111)
CO2: 35 mmol/L — AB (ref 22–32)
CREATININE: 2.46 mg/dL — AB (ref 0.44–1.00)
Calcium: 7.7 mg/dL — ABNORMAL LOW (ref 8.9–10.3)
GFR calc Af Amer: 23 mL/min — ABNORMAL LOW (ref >60)
GFR, EST NON AFRICAN AMERICAN: 20 mL/min — AB (ref >60)
GLUCOSE: 178 mg/dL — AB (ref 65–99)
POTASSIUM: 3.4 mmol/L — AB (ref 3.5–5.1)
Phosphorus: <1 mg/dL — CL (ref 2.5–4.6)
Sodium: 138 mmol/L (ref 135–145)

## 2016-05-04 LAB — CBC
HEMATOCRIT: 22.6 % — AB (ref 35.0–47.0)
Hemoglobin: 7.3 g/dL — ABNORMAL LOW (ref 12.0–16.0)
MCH: 27.4 pg (ref 26.0–34.0)
MCHC: 32.4 g/dL (ref 32.0–36.0)
MCV: 84.7 fL (ref 80.0–100.0)
Platelets: 195 10*3/uL (ref 150–440)
RBC: 2.67 MIL/uL — ABNORMAL LOW (ref 3.80–5.20)
RDW: 16.3 % — AB (ref 11.5–14.5)
WBC: 5.5 10*3/uL (ref 3.6–11.0)

## 2016-05-04 LAB — GLUCOSE, CAPILLARY
GLUCOSE-CAPILLARY: 209 mg/dL — AB (ref 65–99)
Glucose-Capillary: 121 mg/dL — ABNORMAL HIGH (ref 65–99)
Glucose-Capillary: 108 mg/dL — ABNORMAL HIGH (ref 65–99)

## 2016-05-04 LAB — PHOSPHORUS: Phosphorus: 1.2 mg/dL — ABNORMAL LOW (ref 2.5–4.6)

## 2016-05-04 MED ORDER — SENNOSIDES-DOCUSATE SODIUM 8.6-50 MG PO TABS: 1.0000 | ORAL_TABLET | Freq: Two times a day (BID) | ORAL | Status: DC | PRN

## 2016-05-04 MED ORDER — SODIUM PHOSPHATES 45 MMOLE/15ML IV SOLN
26.0000 mmol | Freq: Once | INTRAVENOUS | Status: AC
  Administered 2016-05-04: 26 mmol via INTRAVENOUS
  Filled 2016-05-04: qty 8.67

## 2016-05-04 NOTE — Progress Notes (Signed)
Pres dialysis assessment

## 2016-05-04 NOTE — Progress Notes (Signed)
Started HD

## 2016-05-04 NOTE — Progress Notes (Signed)
Subjective:  The patient is seen and evaluated during hemodialysis. She appears to be tolerating this well. Ultra filtration target 1.5 kg.  Objective:  Vital signs in last 24 hours:  Temp:  [98.1 F (36.7 C)-98.9 F (37.2 C)] 98.3 F (36.8 C) (05/27 1220) Pulse Rate:  [60-75] 71 (05/27 0420) Resp:  [17-20] 20 (05/27 0420) BP: (150-156)/(53-58) 156/54 mmHg (05/27 0420) SpO2:  [96 %-100 %] 98 % (05/27 0420) Weight:  [52.4 kg (115 lb 8.3 oz)] 52.4 kg (115 lb 8.3 oz) (05/27 1220)  Weight change:  Filed Weights   05/02/16 1000 05/02/16 1347 05/04/16 1220  Weight: 53.7 kg (118 lb 6.2 oz) 52.2 kg (115 lb 1.3 oz) 52.4 kg (115 lb 8.3 oz)    Intake/Output:    Intake/Output Summary (Last 24 hours) at 05/04/16 1339 Last data filed at 05/04/16 1217  Gross per 24 hour  Intake    240 ml  Output      0 ml  Net    240 ml     Physical Exam: General: Laying in the bed, no acute distress  HEENT Anicteric, moist oral mucous membranes  Neck supple  Pulm/lungs Normal effort, room air, clear to auscultation  CVS/Heart Regular no rubs  Abdomen:  Soft, nontender, nondistended  Extremities: No peripheral edema  Neurologic: Alert, oriented, follows commands  Skin: No acute rashes  Access:  Rt IJ PC/Dr Dew / 5/18       Basic Metabolic Panel:  No results for input(s): NA, K, CL, CO2, GLUCOSE, BUN, CREATININE, CALCIUM, MG, PHOS in the last 168 hours.   CBC:  Recent Labs Lab 04/29/16 0514 05/02/16 0312 05/04/16 1240  WBC 9.5 9.2 5.5  HGB 7.6* 7.6* 7.3*  HCT 23.0* 23.5* 22.6*  MCV 85.3 84.9 84.7  PLT 169 197 195      Microbiology:  Recent Results (from the past 720 hour(s))  Urine culture     Status: Abnormal   Collection Time: 04/23/16  9:43 PM  Result Value Ref Range Status   Specimen Description URINE, CATHETERIZED  Final   Special Requests NONE  Final   Culture >=100,000 COLONIES/mL VIRIDANS STREPTOCOCCUS (A)  Final   Report Status 04/27/2016 FINAL  Final  MRSA  PCR Screening     Status: None   Collection Time: 04/25/16  6:45 AM  Result Value Ref Range Status   MRSA by PCR NEGATIVE NEGATIVE Final    Comment:        The GeneXpert MRSA Assay (FDA approved for NASAL specimens only), is one component of a comprehensive MRSA colonization surveillance program. It is not intended to diagnose MRSA infection nor to guide or monitor treatment for MRSA infections.     Coagulation Studies: No results for input(s): LABPROT, INR in the last 72 hours.  Urinalysis: No results for input(s): COLORURINE, LABSPEC, PHURINE, GLUCOSEU, HGBUR, BILIRUBINUR, KETONESUR, PROTEINUR, UROBILINOGEN, NITRITE, LEUKOCYTESUR in the last 72 hours.  Invalid input(s): APPERANCEUR    Imaging: No results found.   Medications:     . amLODipine  10 mg Oral Daily  . atorvastatin  40 mg Oral QHS  . calcium acetate  667 mg Oral TID WC  . epoetin (EPOGEN/PROCRIT) injection  10,000 Units Intravenous Q T,Th,Sa-HD  . feeding supplement (NEPRO CARB STEADY)  237 mL Oral BID BM  . heparin  5,000 Units Subcutaneous Q8H  . hydrALAZINE  50 mg Oral Q8H  . insulin aspart  0-9 Units Subcutaneous TID WC  . losartan  50 mg Oral  Daily  . tuberculin  5 Units Intradermal Once     Assessment/ Plan:  60 y.o. 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  1. ESRD:  Secondary to diabetic nephropathy and hypertension. GI complaints could be uremic symptoms. Serologies neg previous admission, permcath placed and first hemodialysis session 5/18. Completed three hemodialysis treatments.  -  Patient is seen and evaluated during hemodialysis. She appears to be tolerating this quite well. We will plan to complete dialysis today. Ultrafiltration target is 1.5 kg. -- D/c planning for [facility], we are currently awaiting  a QuantiFERON test.  2. Hypertension:  -  Blood pressure 156/54 however overall range shows reasonable control.  Continue amlodipine, hydralazine, and losartan.     3. Diabetes Mellitus type II with chronic kidney disease: insulin dependent. Hemoglobin A1c 7.2% - currently on sliding scale insulin.  4. Secondary Hyperparathyroidism: PTH 105 - continue PhosLo one tablet by mouth 3 times a day with meals.  5. Urinalysis suggesting UTI - Patient has completed therapy with cefuroxime.  6. Anemia with chronic kidney disease: - hgb down to 7.3, continue epogen 10000 units IV with HD.    LOS: Williams, Richelle Glick 5/27/20171:39 PM

## 2016-05-04 NOTE — Progress Notes (Signed)
Pre dialysis  

## 2016-05-04 NOTE — Progress Notes (Signed)
[place] at [place] NAME: Anita Contreras    MR#:  [id number]  DATE OF BIRTH:  [birth year]  SUBJECTIVE:  CHIEF COMPLAINT:    Patient here due to acute on chronic renal failure and now progressed to ESRD.  No new complaints, had a quantiferon and test for TB. Results are pending.   Seen during hemodialysi  Review of Systems  Constitutional: Negative for fever, chills, weight loss, malaise/fatigue and diaphoresis.  HENT: Negative for congestion.   Eyes: Negative for blurred vision and double vision.  Respiratory: Negative for cough, sputum production, shortness of breath and wheezing.   Cardiovascular: Negative for chest pain, palpitations, orthopnea, leg swelling and PND.  Gastrointestinal: Negative for nausea, vomiting, abdominal pain, diarrhea, constipation and blood in stool.  Genitourinary: Negative for urgency, frequency and hematuria.  Musculoskeletal: Negative for falls.  Neurological: Negative for dizziness, tremors, focal weakness, weakness and headaches.  Endo/Heme/Allergies: Does not bruise/bleed easily.  Psychiatric/Behavioral: Negative for depression. The patient does not have insomnia.     VITAL SIGNS: Blood pressure 156/54, pulse 71, temperature 98.9 F (37.2 C), temperature source Oral, resp. rate 20, height 4\' 5"  (1.346 m), weight 52.2 kg (115 lb 1.3 oz), last menstrual period 09/19/2001, SpO2 98 %.  PHYSICAL EXAMINATION:   GENERAL:  60 y.o.-year-old patient lying in the bed with no acute distress.  EYES: Pupils equal, round, reactive to light and accommodation. No scleral icterus. Extraocular muscles intact.  HEENT: Head atraumatic, normocephalic. Oropharynx and nasopharynx clear.  NECK:  Supple, no jugular venous distention. No thyroid enlargement, no tenderness.  LUNGS: Normal breath sounds bilaterally, minimal end-exp. Wheezing b/l, no rales, rhonchi. No use of accessory muscles of respiration.   CARDIOVASCULAR: S1, S2 normal. No murmurs, rubs, or gallops.  ABDOMEN: Soft, nontender, nondistended. Bowel sounds present. No organomegaly or mass.  EXTREMITIES: No pedal edema, cyanosis, or clubbing. NEUROLOGIC: Cranial nerves II through XII are intact. No focal motor or sensory deficits appreciated bilaterally. PSYCHIATRIC: The patient is alert and oriented x 3.  SKIN: No obvious rash, lesion, or ulcer.   Right chest dialysis catheter in place.  ORDERS/RESULTS REVIEWED:   CBC  Recent Labs Lab 04/29/16 0514 05/02/16 0312  WBC 9.5 9.2  HGB 7.6* 7.6*  HCT 23.0* 23.5*  PLT 169 197  MCV 85.3 84.9  MCH 28.1 27.6  MCHC 33.0 32.5  RDW 16.2* 15.7*   ------------------------------------------------------------------------------------------------------------------  Chemistries  No results for input(s): NA, K, CL, CO2, GLUCOSE, BUN, CREATININE, CALCIUM, MG, AST, ALT, ALKPHOS, BILITOT in the last 168 hours.  Invalid input(s): GFRCGP ------------------------------------------------------------------------------------------------------------------ estimated creatinine clearance is 12.2 mL/min (by C-G formula based on Cr of 2.98). ------------------------------------------------------------------------------------------------------------------ No results for input(s): TSH, T4TOTAL, T3FREE, THYROIDAB in the last 72 hours.  Invalid input(s): FREET3  Cardiac Enzymes No results for input(s): CKMB, TROPONINI, MYOGLOBIN in the last 168 hours.  Invalid input(s): CK ------------------------------------------------------------------------------------------------------------------ Invalid input(s): POCBNP ---------------------------------------------------------------------------------------------------------------  RADIOLOGY: No results found.  ASSESSMENT AND PLAN:  Principal Problem:   Acute on chronic renal failure (HCC)  #1. Acute on chronic renal failure with known history of CKD  stage III - did not improve w/ conventional treatment and progressed to ESRD on HD.  Likely cause of CKD is underlying diabetic Nephropathy. - tolerating HD today.    - appreciate Nephro input and pt. Has a spot at [facility] HD starting Tuesday of next week.   Next  dialysis today. Waiting for quantifERON gold test results before discharge. #2. Essential  hypertension improved.  #3. Acute CHF, diastolic,Echocardiogram done 10th of May 2017 revealed ejection fraction of 50%, with mild aortic regurgitation, moderate mitral valve regurgitation, severe tricuspid valve regurgitation  -Improved with hemodialysis  #4 Vomiting - resolved.  No acute issue  #5 urinary tract infection - urine culture growing Strep. Viridans.  - finished Ceftin.  #6. Dizziness-was due to some mild hypotension and now resolved.  #7. Anemia of chronic disease-hemoglobin stable. No acute need for transfusion.  - will monitor.   #8. Diabetes mellitus type 2-blood sugar stable. Continue sliding scale insulin.  #9 Hyperkalemia - resolved - was on Veltassa, now on hemodialysis and stable.   #10 Constipation - resolved with lactulose use when necessary  LAXATIVES. DRUG ALLERGIES: No Known Allergies  CODE STATUS:     Code Status Orders        Start     Ordered   04/23/16 2030  Full code   Continuous     04/23/16 2029    Code Status History    Date Active Date Inactive Code Status Order ID Comments User Context   04/15/2016  7:18 PM 04/19/2016  4:08 PM Full Code [id number]  Gladstone Lighter, MD Inpatient      TOTAL TIME TAKING CARE OF THIS PATIENT: 25 minutes.    Epifanio Lesches M.D on 05/04/2016 at 10:54 AM  Between 7am to 6pm - Pager - [phone]  After 6pm go to [email or web address] - password [email or web address] [facility]  Office  [phone]  CC: Primary care physician; WHITE, Arlie Solomons, FNP

## 2016-05-04 NOTE — Progress Notes (Addendum)
Okay to place order for Senna to be PRN 2 x daily instead of scheduled 2 x daily per Dr. Vianne Bulls

## 2016-05-04 NOTE — Progress Notes (Signed)
Notified Dr. Vianne Bulls of critical phosphorus at less than 1. No new orders received. Will continue to monitor.

## 2016-05-04 NOTE — Progress Notes (Signed)
This note also relates to the following rows which could not be included: Pulse Rate - Cannot attach notes to unvalidated device data Resp - Cannot attach notes to unvalidated device data BP - Cannot attach notes to unvalidated device data   DIALYSIS COMPLETED

## 2016-05-04 NOTE — Progress Notes (Signed)
Spoke with Dr. Holley Raring. Do not give supper dose of phoslo. Will recheck levels and may need to discontinue order at a further date.

## 2016-05-05 LAB — GLUCOSE, CAPILLARY
GLUCOSE-CAPILLARY: 136 mg/dL — AB (ref 65–99)
Glucose-Capillary: 115 mg/dL — ABNORMAL HIGH (ref 65–99)
Glucose-Capillary: 143 mg/dL — ABNORMAL HIGH (ref 65–99)
Glucose-Capillary: 269 mg/dL — ABNORMAL HIGH (ref 65–99)

## 2016-05-05 LAB — PHOSPHORUS: Phosphorus: 4.5 mg/dL (ref 2.5–4.6)

## 2016-05-05 NOTE — Progress Notes (Signed)
[place] at [place] NAME: Anita Contreras    MR#:  [id number]  DATE OF BIRTH:  [birth year]  SUBJECTIVE:  CHIEF COMPLAINT:    Patient here due to acute on chronic renal failure and now progressed to ESRD.  No new complaints, had a quantiferon and test for TB. Results are pending. Phosphorus was low yesterday, held phoslo evening dose,Phosphate improved today.  Seen during hemodialysi  Review of Systems  Constitutional: Negative for fever, chills, weight loss, malaise/fatigue and diaphoresis.  HENT: Negative for congestion.   Eyes: Negative for blurred vision and double vision.  Respiratory: Negative for cough, sputum production, shortness of breath and wheezing.   Cardiovascular: Negative for chest pain, palpitations, orthopnea, leg swelling and PND.  Gastrointestinal: Negative for nausea, vomiting, abdominal pain, diarrhea, constipation and blood in stool.  Genitourinary: Negative for urgency, frequency and hematuria.  Musculoskeletal: Negative for falls.  Neurological: Negative for dizziness, tremors, focal weakness, weakness and headaches.  Endo/Heme/Allergies: Does not bruise/bleed easily.  Psychiatric/Behavioral: Negative for depression. The patient does not have insomnia.     VITAL SIGNS: Blood pressure 163/61, pulse 71, temperature 98.9 F (37.2 C), temperature source Oral, resp. rate 18, height 4\' 5"  (1.346 m), weight 50.2 kg (110 lb 10.7 oz), last menstrual period 09/19/2001, SpO2 99 %.  PHYSICAL EXAMINATION:   GENERAL:  60 y.o.-year-old patient lying in the bed with no acute distress.  EYES: Pupils equal, round, reactive to light and accommodation. No scleral icterus. Extraocular muscles intact.  HEENT: Head atraumatic, normocephalic. Oropharynx and nasopharynx clear.  NECK:  Supple, no jugular venous distention. No thyroid enlargement, no tenderness.  LUNGS: Normal breath sounds bilaterally, minimal end-exp.  Wheezing b/l, no rales, rhonchi. No use of accessory muscles of respiration.  CARDIOVASCULAR: S1, S2 normal. No murmurs, rubs, or gallops.  ABDOMEN: Soft, nontender, nondistended. Bowel sounds present. No organomegaly or mass.  EXTREMITIES: No pedal edema, cyanosis, or clubbing. NEUROLOGIC: Cranial nerves II through XII are intact. No focal motor or sensory deficits appreciated bilaterally. PSYCHIATRIC: The patient is alert and oriented x 3.  SKIN: No obvious rash, lesion, or ulcer.   Right chest dialysis catheter in place.  ORDERS/RESULTS REVIEWED:   CBC  Recent Labs Lab 04/29/16 0514 05/02/16 0312 05/04/16 1240  WBC 9.5 9.2 5.5  HGB 7.6* 7.6* 7.3*  HCT 23.0* 23.5* 22.6*  PLT 169 197 195  MCV 85.3 84.9 84.7  MCH 28.1 27.6 27.4  MCHC 33.0 32.5 32.4  RDW 16.2* 15.7* 16.3*   ------------------------------------------------------------------------------------------------------------------  Chemistries   Recent Labs Lab 05/04/16 1242  NA 138  K 3.4*  CL 99*  CO2 35*  GLUCOSE 178*  BUN 14  CREATININE 2.46*  CALCIUM 7.7*   ------------------------------------------------------------------------------------------------------------------ estimated creatinine clearance is 14.5 mL/min (by C-G formula based on Cr of 2.46). ------------------------------------------------------------------------------------------------------------------ No results for input(s): TSH, T4TOTAL, T3FREE, THYROIDAB in the last 72 hours.  Invalid input(s): FREET3  Cardiac Enzymes No results for input(s): CKMB, TROPONINI, MYOGLOBIN in the last 168 hours.  Invalid input(s): CK ------------------------------------------------------------------------------------------------------------------ Invalid input(s): POCBNP ---------------------------------------------------------------------------------------------------------------  RADIOLOGY: No results found.  ASSESSMENT AND PLAN:  Principal  Problem:   Acute on chronic renal failure (HCC)  #1. Acute on chronic renal failure with known history of CKD stage III - did not improve w/ conventional treatment and progressed to ESRD on HD.  Likely cause of CKD is underlying diabetic Nephropathy. - tolerating HD today.    - appreciate Nephro input and pt. Has a  spot at [facility] HD starting Tuesday of next week. Waiting for quantifERON gold test results before discharge. #2. Essential hypertension improved.  #3. Acute CHF, diastolic,Echocardiogram done 10th of May 2017 revealed ejection fraction of 50%, with mild aortic regurgitation, moderate mitral valve regurgitation, severe tricuspid valve regurgitation  -Improved with hemodialysis  #4 Vomiting - resolved.  No acute issue  #5 urinary tract infection - urine culture growing Strep. Viridans.  - finished Ceftin.  #6. Dizziness-was due to some mild hypotension and now resolved.  #7. Anemia of chronic disease-hemoglobin stable. No acute need for transfusion.  - will monitor.   #8. Diabetes mellitus type 2-blood sugar stable. Continue sliding scale insulin.  #9 Hyperkalemia - resolved - was on Veltassa, now on hemodialysis and stable.   #10 Constipation - resolved with lactulose use when necessary  LAXATIVES. DRUG ALLERGIES: No Known Allergies  CODE STATUS:     Code Status Orders        Start     Ordered   04/23/16 2030  Full code   Continuous     04/23/16 2029    Code Status History    Date Active Date Inactive Code Status Order ID Comments User Context   04/15/2016  7:18 PM 04/19/2016  4:08 PM Full Code [id number]  Gladstone Lighter, MD Inpatient      TOTAL TIME TAKING CARE OF THIS PATIENT: 25 minutes.    Epifanio Lesches M.D on 05/05/2016 at 10:21 AM  Between 7am to 6pm - Pager - [phone]  After 6pm go to [email or web address] - password [email or web address] [facility]  Office  [phone]  CC: Primary care physician; WHITE, Arlie Solomons, FNP

## 2016-05-05 NOTE — Progress Notes (Signed)
MEDICATION RELATED CONSULT NOTE - INITIAL   Pharmacy Consult for phosphorus replacement   No Known Allergies  Patient Measurements: Height: 4\' 5"  (134.6 cm) Weight: 110 lb 10.7 oz (50.2 kg) IBW/kg (Calculated) : 29.4   Vital Signs: Temp: 98.9 F (37.2 C) (05/28 0505) Temp Source: Oral (05/28 0505) BP: 163/61 mmHg (05/28 0505) Pulse Rate: 71 (05/28 0505) Intake/Output from previous day: 05/27 0701 - 05/28 0700 In: 0  Out: 1500  Intake/Output from this shift:    Labs:  Recent Labs  05/04/16 1240 05/04/16 1242 05/04/16 1755 05/05/16 0804  WBC 5.5  --   --   --   HGB 7.3*  --   --   --   HCT 22.6*  --   --   --   PLT 195  --   --   --   CREATININE  --  2.46*  --   --   PHOS  --  <1.0* 1.2* 4.5  ALBUMIN  --  2.6*  --   --    Estimated Creatinine Clearance: 14.5 mL/min (by C-G formula based on Cr of 2.46).   Microbiology: Recent Results (from the past 720 hour(s))  Urine culture     Status: Abnormal   Collection Time: 04/23/16  9:43 PM  Result Value Ref Range Status   Specimen Description URINE, CATHETERIZED  Final   Special Requests NONE  Final   Culture >=100,000 COLONIES/mL VIRIDANS STREPTOCOCCUS (A)  Final   Report Status 04/27/2016 FINAL  Final  MRSA PCR Screening     Status: None   Collection Time: 04/25/16  6:45 AM  Result Value Ref Range Status   MRSA by PCR NEGATIVE NEGATIVE Final    Comment:        The GeneXpert MRSA Assay (FDA approved for NASAL specimens only), is one component of a comprehensive MRSA colonization surveillance program. It is not intended to diagnose MRSA infection nor to guide or monitor treatment for MRSA infections.     Medical History: Past Medical History  Diagnosis Date  . Acute cystitis with hematuria   . Neuropathy ([place])   . Migraine variant   . Cataracts, bilateral   . Diabetic retinopathy, background ([place])   . Dizziness   . Vitamin D deficiency   . Personal history of noncompliance with medical treatment,  presenting hazards to health   . Type II diabetes mellitus with ophthalmic manifestations, uncontrolled ([place])   . Hyperlipidemia   . OP (osteoporosis)   . Hypertension   . CKD (chronic kidney disease)     Medications:  Scheduled:  . amLODipine  10 mg Oral Daily  . atorvastatin  40 mg Oral QHS  . calcium acetate  667 mg Oral TID WC  . epoetin (EPOGEN/PROCRIT) injection  10,000 Units Intravenous Q T,Th,Sa-HD  . feeding supplement (NEPRO CARB STEADY)  237 mL Oral BID BM  . heparin  5,000 Units Subcutaneous Q8H  . hydrALAZINE  50 mg Oral Q8H  . insulin aspart  0-9 Units Subcutaneous TID WC  . losartan  50 mg Oral Daily  . tuberculin  5 Units Intradermal Once   Infusions:    Assessment: Patient is a 60 yo female admitted for acute on chronic renal failure.  Patient transitioned to HD.  MD requested phosphorus replacement for phos <1 on 5/27.  Discussed with nephrology on 5/27 and follow up level obtained after dialysis of 1.2.  Per nephrology may supplement with a dose of sodium phosphate 26 mmol.  Phosphorus today of 4.5   Plan:  Will continue to follow phosphorus level.  Phosphorus today within normal limits.  Patient is ESRD requiring HD.  Will follow up with a level in AM.  Contact nephrology prior to ordering supplementation.   Una Yeomans G 05/05/2016,10:41 AM

## 2016-05-05 NOTE — Progress Notes (Signed)
Subjective:  Family at bedside.  Pt doing well. Still awaiting quantiferon gold test.  Had HD yesterday.  Objective:  Vital signs in last 24 hours:  Temp:  [98.1 F (36.7 C)-98.9 F (37.2 C)] 98.4 F (36.9 C) (05/28 1320) Pulse Rate:  [71-85] 73 (05/28 1320) Resp:  [14-18] 17 (05/28 1320) BP: (137-168)/(46-61) 137/46 mmHg (05/28 1320) SpO2:  [94 %-99 %] 94 % (05/28 1320) Weight:  [50.2 kg (110 lb 10.7 oz)] 50.2 kg (110 lb 10.7 oz) (05/27 1600)  Weight change:  Filed Weights   05/02/16 1347 05/04/16 1220 05/04/16 1600  Weight: 52.2 kg (115 lb 1.3 oz) 52.4 kg (115 lb 8.3 oz) 50.2 kg (110 lb 10.7 oz)    Intake/Output:    Intake/Output Summary (Last 24 hours) at 05/05/16 1529 Last data filed at 05/05/16 1300  Gross per 24 hour  Intake      0 ml  Output   1500 ml  Net  -1500 ml     Physical Exam: General: Laying in the bed, no acute distress  HEENT Anicteric, moist oral mucous membranes  Neck supple  Pulm/lungs Normal effort, room air, clear to auscultation  CVS/Heart Regular no rubs  Abdomen:  Soft, nontender, nondistended  Extremities: No peripheral edema  Neurologic: Alert, oriented, follows commands  Skin: No acute rashes  Access:  Rt IJ PC/Dr Dew / 5/18       Basic Metabolic Panel:   Recent Labs Lab 05/04/16 1242 05/04/16 1755 05/05/16 0804  NA 138  --   --   K 3.4*  --   --   CL 99*  --   --   CO2 35*  --   --   GLUCOSE 178*  --   --   BUN 14  --   --   CREATININE 2.46*  --   --   CALCIUM 7.7*  --   --   PHOS <1.0* 1.2* 4.5     CBC:  Recent Labs Lab 04/29/16 0514 05/02/16 0312 05/04/16 1240  WBC 9.5 9.2 5.5  HGB 7.6* 7.6* 7.3*  HCT 23.0* 23.5* 22.6*  MCV 85.3 84.9 84.7  PLT 169 197 195      Microbiology:  Recent Results (from the past 720 hour(s))  Urine culture     Status: Abnormal   Collection Time: 04/23/16  9:43 PM  Result Value Ref Range Status   Specimen Description URINE, CATHETERIZED  Final   Special Requests NONE   Final   Culture >=100,000 COLONIES/mL VIRIDANS STREPTOCOCCUS (A)  Final   Report Status 04/27/2016 FINAL  Final  MRSA PCR Screening     Status: None   Collection Time: 04/25/16  6:45 AM  Result Value Ref Range Status   MRSA by PCR NEGATIVE NEGATIVE Final    Comment:        The GeneXpert MRSA Assay (FDA approved for NASAL specimens only), is one component of a comprehensive MRSA colonization surveillance program. It is not intended to diagnose MRSA infection nor to guide or monitor treatment for MRSA infections.     Coagulation Studies: No results for input(s): LABPROT, INR in the last 72 hours.  Urinalysis: No results for input(s): COLORURINE, LABSPEC, PHURINE, GLUCOSEU, HGBUR, BILIRUBINUR, KETONESUR, PROTEINUR, UROBILINOGEN, NITRITE, LEUKOCYTESUR in the last 72 hours.  Invalid input(s): APPERANCEUR    Imaging: No results found.   Medications:     . amLODipine  10 mg Oral Daily  . atorvastatin  40 mg Oral QHS  . calcium acetate  667 mg Oral TID WC  . epoetin (EPOGEN/PROCRIT) injection  10,000 Units Intravenous Q T,Th,Sa-HD  . feeding supplement (NEPRO CARB STEADY)  237 mL Oral BID BM  . heparin  5,000 Units Subcutaneous Q8H  . hydrALAZINE  50 mg Oral Q8H  . insulin aspart  0-9 Units Subcutaneous TID WC  . losartan  50 mg Oral Daily  . tuberculin  5 Units Intradermal Once     Assessment/ Plan:  60 y.o. 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  1. ESRD:  Secondary to diabetic nephropathy and hypertension. GI complaints could be uremic symptoms. Serologies neg previous admission, permcath placed and first hemodialysis session 5/18. Completed three hemodialysis treatments.  -  Patient Completed dialysis yesterday. No acute indication for dialysis today. Next dialysis on Tuesday. -- D/c planning  for [facility], we are currently awaiting a QuantiFERON test.  2. Hypertension:  -  Blood pressure 137/46.  Continue amlodipine, hydralazine, and losartan.     3. Diabetes Mellitus type II with chronic kidney disease: insulin dependent. Hemoglobin A1c 7.2% - currently on sliding scale insulin.  4. Secondary Hyperparathyroidism: PTH 105 - continue PhosLo one tablet by mouth 3 times a day with meals.  5. Urinalysis suggesting UTI - Patient has completed therapy with cefuroxime.  6. Anemia with chronic kidney disease: - Last hemoglobin was 7.3. We will maintain the patient on Epogen 10,000 units IV with dialysis.   LOS: 12 Rabecca Birge 5/28/20173:29 PM

## 2016-05-06 DIAGNOSIS — I509 Heart failure, unspecified: Secondary | ICD-10-CM | POA: Insufficient documentation

## 2016-05-06 DIAGNOSIS — D689 Coagulation defect, unspecified: Secondary | ICD-10-CM | POA: Insufficient documentation

## 2016-05-06 DIAGNOSIS — R0602 Shortness of breath: Secondary | ICD-10-CM | POA: Insufficient documentation

## 2016-05-06 DIAGNOSIS — N186 End stage renal disease: Secondary | ICD-10-CM | POA: Insufficient documentation

## 2016-05-06 LAB — CBC
HEMATOCRIT: 23.6 % — AB (ref 35.0–47.0)
HEMOGLOBIN: 7.6 g/dL — AB (ref 12.0–16.0)
MCH: 27.1 pg (ref 26.0–34.0)
MCHC: 32.2 g/dL (ref 32.0–36.0)
MCV: 84.4 fL (ref 80.0–100.0)
Platelets: 224 10*3/uL (ref 150–440)
RBC: 2.79 MIL/uL — AB (ref 3.80–5.20)
RDW: 16.2 % — ABNORMAL HIGH (ref 11.5–14.5)
WBC: 7.7 10*3/uL (ref 3.6–11.0)

## 2016-05-06 LAB — PHOSPHORUS: PHOSPHORUS: 3.6 mg/dL (ref 2.5–4.6)

## 2016-05-06 LAB — GLUCOSE, CAPILLARY
GLUCOSE-CAPILLARY: 278 mg/dL — AB (ref 65–99)
GLUCOSE-CAPILLARY: 232 mg/dL — AB (ref 65–99)
Glucose-Capillary: 120 mg/dL — ABNORMAL HIGH (ref 65–99)

## 2016-05-06 LAB — QUANTIFERON IN TUBE
QUANTIFERON MITOGEN VALUE: 0.81 [IU]/mL
QUANTIFERON TB AG VALUE: 0.07 [IU]/mL
QUANTIFERON TB GOLD: NEGATIVE
Quantiferon Nil Value: 0.09 IU/mL

## 2016-05-06 LAB — QUANTIFERON TB GOLD ASSAY (BLOOD)

## 2016-05-06 NOTE — Progress Notes (Signed)
MEDICATION RELATED CONSULT NOTE - INITIAL   Pharmacy Consult for phosphorus replacement   No Known Allergies  Patient Measurements: Height: 4\' 5"  (134.6 cm) Weight: 110 lb 10.7 oz (50.2 kg) IBW/kg (Calculated) : 29.4  Labs:  Recent Labs  05/04/16 1240  05/04/16 1242 05/04/16 1755 05/05/16 0804 05/06/16 0604  WBC 5.5  --   --   --   --   --   HGB 7.3*  --   --   --   --   --   HCT 22.6*  --   --   --   --   --   PLT 195  --   --   --   --   --   CREATININE  --   --  2.46*  --   --   --   PHOS  --   < > <1.0* 1.2* 4.5 3.6  ALBUMIN  --   --  2.6*  --   --   --   < > = values in this interval not displayed. Estimated Creatinine Clearance: 14.5 mL/min (by C-G formula based on Cr of 2.46).   Assessment: Patient is a 60 yo female admitted for acute on chronic renal failure.  Patient transitioned to HD.  MD requested phosphorus replacement for phos <1 on 5/27.  Discussed with nephrology on 5/27 and follow up level obtained after dialysis of 1.2.  Per nephrology may supplement with a dose of sodium phosphate 26 mmol.    Phosphorus today of 3.6   Plan:  Will continue to follow phosphorus level.  Phosphorus today within normal limits.  Patient is ESRD requiring HD.  Will follow up with a level in AM.  Contact nephrology prior to ordering supplementation.   Syris Brookens C 05/06/2016,11:48 AM

## 2016-05-06 NOTE — Progress Notes (Signed)
Subjective:  Patient last had dialysis on Saturday. She is due for dialysis again tomorrow. QuantiFERON test still pending.   Objective:  Vital signs in last 24 hours:  Temp:  [98 F (36.7 C)-98.6 F (37 C)] 98.6 F (37 C) (05/29 1328) Pulse Rate:  [71-75] 75 (05/29 1328) Resp:  [16-20] 16 (05/29 1328) BP: (139-143)/(53-57) 143/54 mmHg (05/29 1328) SpO2:  [98 %-100 %] 100 % (05/29 1328)  Weight change:  Filed Weights   05/02/16 1347 05/04/16 1220 05/04/16 1600  Weight: 52.2 kg (115 lb 1.3 oz) 52.4 kg (115 lb 8.3 oz) 50.2 kg (110 lb 10.7 oz)    Intake/Output:    Intake/Output Summary (Last 24 hours) at 05/06/16 1531 Last data filed at 05/06/16 1300  Gross per 24 hour  Intake      0 ml  Output      0 ml  Net      0 ml     Physical Exam: General: Laying in the bed, no acute distress  HEENT Anicteric, moist oral mucous membranes  Neck supple  Pulm/lungs Normal effort, room air, clear to auscultation  CVS/Heart Regular no rubs  Abdomen:  Soft, nontender, nondistended  Extremities: No peripheral edema  Neurologic: Alert, oriented, follows commands  Skin: No acute rashes  Access:  Rt IJ PC/Dr Dew / 5/18       Basic Metabolic Panel:   Recent Labs Lab 05/04/16 1242 05/04/16 1755 05/05/16 0804 05/06/16 0604  NA 138  --   --   --   K 3.4*  --   --   --   CL 99*  --   --   --   CO2 35*  --   --   --   GLUCOSE 178*  --   --   --   BUN 14  --   --   --   CREATININE 2.46*  --   --   --   CALCIUM 7.7*  --   --   --   PHOS <1.0* 1.2* 4.5 3.6     CBC:  Recent Labs Lab 05/02/16 0312 05/04/16 1240 05/06/16 0607  WBC 9.2 5.5 7.7  HGB 7.6* 7.3* 7.6*  HCT 23.5* 22.6* 23.6*  MCV 84.9 84.7 84.4  PLT 197 195 224      Microbiology:  Recent Results (from the past 720 hour(s))  Urine culture     Status: Abnormal   Collection Time: 04/23/16  9:43 PM  Result Value Ref Range Status   Specimen Description URINE, CATHETERIZED  Final   Special Requests NONE   Final   Culture >=100,000 COLONIES/mL VIRIDANS STREPTOCOCCUS (A)  Final   Report Status 04/27/2016 FINAL  Final  MRSA PCR Screening     Status: None   Collection Time: 04/25/16  6:45 AM  Result Value Ref Range Status   MRSA by PCR NEGATIVE NEGATIVE Final    Comment:        The GeneXpert MRSA Assay (FDA approved for NASAL specimens only), is one component of a comprehensive MRSA colonization surveillance program. It is not intended to diagnose MRSA infection nor to guide or monitor treatment for MRSA infections.     Coagulation Studies: No results for input(s): LABPROT, INR in the last 72 hours.  Urinalysis: No results for input(s): COLORURINE, LABSPEC, PHURINE, GLUCOSEU, HGBUR, BILIRUBINUR, KETONESUR, PROTEINUR, UROBILINOGEN, NITRITE, LEUKOCYTESUR in the last 72 hours.  Invalid input(s): APPERANCEUR    Imaging: No results found.   Medications:     .  amLODipine  10 mg Oral Daily  . atorvastatin  40 mg Oral QHS  . calcium acetate  667 mg Oral TID WC  . epoetin (EPOGEN/PROCRIT) injection  10,000 Units Intravenous Q T,Th,Sa-HD  . feeding supplement (NEPRO CARB STEADY)  237 mL Oral BID BM  . heparin  5,000 Units Subcutaneous Q8H  . hydrALAZINE  50 mg Oral Q8H  . insulin aspart  0-9 Units Subcutaneous TID WC  . losartan  50 mg Oral Daily  . tuberculin  5 Units Intradermal Once     Assessment/ Plan:  60 y.o. 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  1. ESRD:  Secondary to diabetic nephropathy and hypertension. GI complaints could be uremic symptoms. Serologies neg previous admission, permcath placed and first hemodialysis session 5/18. Completed three hemodialysis treatments.  - Patient due for hemodialysis tomorrow. We will prepare orders. -- D/c planning for [facility], we are  currently awaiting a QuantiFERON test.  2. Hypertension:  -  Blood pressure remains under reasonable control.  Continue amlodipine, hydralazine, and losartan.     3. Diabetes Mellitus type II with chronic kidney disease: insulin dependent. Hemoglobin A1c 7.2% - currently on sliding scale insulin.  4. Secondary Hyperparathyroidism: PTH 105 - Phosphorous was low recently. Discontinue calcium acetate.  5. Urinalysis suggesting UTI - Patient has completed therapy with cefuroxime.  6. Anemia with chronic kidney disease: - Hemoglobin up to 7.6. Continue Epogen with dialysis.   LOS: 13 Josue Kass 5/29/20173:31 PM

## 2016-05-06 NOTE — Care Management (Signed)
Spoke with [facility].  Quantiferon gold test still not complete.  It was not "processed" until 5/25 even though drawn 5/24.  Informed by [facility], should result 5/30.  If negative, would anticipate discharge 5/30 after dialysis.

## 2016-05-06 NOTE — Progress Notes (Signed)
[place] at [place] NAME: Anita Contreras    MR#:  [id number]  DATE OF BIRTH:  [birth year]  SUBJECTIVE:  CHIEF COMPLAINT:    Patient here due to acute on chronic renal failure and now progressed to ESRD.  No new complaints, had a quantiferon and test for TB. Results are pending. Spoke  With help of translator.not eatiing well. noconstipation,  Seen during hemodialysi  Review of Systems  Constitutional: Negative for fever, chills, weight loss, malaise/fatigue and diaphoresis.  HENT: Negative for congestion.   Eyes: Negative for blurred vision and double vision.  Respiratory: Negative for cough, sputum production, shortness of breath and wheezing.   Cardiovascular: Negative for chest pain, palpitations, orthopnea, leg swelling and PND.  Gastrointestinal: Negative for nausea, vomiting, abdominal pain, diarrhea, constipation and blood in stool.  Genitourinary: Negative for urgency, frequency and hematuria.  Musculoskeletal: Negative for falls.  Neurological: Negative for dizziness, tremors, focal weakness, weakness and headaches.  Endo/Heme/Allergies: Does not bruise/bleed easily.  Psychiatric/Behavioral: Negative for depression. The patient does not have insomnia.     VITAL SIGNS: Blood pressure 141/57, pulse 72, temperature 98.4 F (36.9 C), temperature source Oral, resp. rate 20, height 4\' 5"  (1.346 m), weight 50.2 kg (110 lb 10.7 oz), last menstrual period 09/19/2001, SpO2 100 %.  PHYSICAL EXAMINATION:   GENERAL:  60 y.o.-year-old patient lying in the bed with no acute distress.  EYES: Pupils equal, round, reactive to light and accommodation. No scleral icterus. Extraocular muscles intact.  HEENT: Head atraumatic, normocephalic. Oropharynx and nasopharynx clear.  NECK:  Supple, no jugular venous distention. No thyroid enlargement, no tenderness.  LUNGS: Normal breath sounds bilaterally, minimal end-exp. Wheezing b/l, no  rales, rhonchi. No use of accessory muscles of respiration.  CARDIOVASCULAR: S1, S2 normal. No murmurs, rubs, or gallops.  ABDOMEN: Soft, nontender, nondistended. Bowel sounds present. No organomegaly or mass.  EXTREMITIES: No pedal edema, cyanosis, or clubbing. NEUROLOGIC: Cranial nerves II through XII are intact. No focal motor or sensory deficits appreciated bilaterally. PSYCHIATRIC: The patient is alert and oriented x 3.  SKIN: No obvious rash, lesion, or ulcer.   Right chest dialysis catheter in place.  ORDERS/RESULTS REVIEWED:   CBC  Recent Labs Lab 05/02/16 0312 05/04/16 1240  WBC 9.2 5.5  HGB 7.6* 7.3*  HCT 23.5* 22.6*  PLT 197 195  MCV 84.9 84.7  MCH 27.6 27.4  MCHC 32.5 32.4  RDW 15.7* 16.3*   ------------------------------------------------------------------------------------------------------------------  Chemistries   Recent Labs Lab 05/04/16 1242  NA 138  K 3.4*  CL 99*  CO2 35*  GLUCOSE 178*  BUN 14  CREATININE 2.46*  CALCIUM 7.7*   ------------------------------------------------------------------------------------------------------------------ estimated creatinine clearance is 14.5 mL/min (by C-G formula based on Cr of 2.46). ------------------------------------------------------------------------------------------------------------------ No results for input(s): TSH, T4TOTAL, T3FREE, THYROIDAB in the last 72 hours.  Invalid input(s): FREET3  Cardiac Enzymes No results for input(s): CKMB, TROPONINI, MYOGLOBIN in the last 168 hours.  Invalid input(s): CK ------------------------------------------------------------------------------------------------------------------ Invalid input(s): POCBNP ---------------------------------------------------------------------------------------------------------------  RADIOLOGY: No results found.  ASSESSMENT AND PLAN:  Principal Problem:   Acute on chronic renal failure (HCC)  #1. Acute on chronic renal  failure with known history of CKD stage III - did not improve w/ conventional treatment and progressed to ESRD on HD.  Likely cause of CKD is underlying diabetic Nephropathy. - tolerating HD .Marland Kitchen    - appreciate Nephro input and pt. Has a spot at [facility] HD starting Tuesday /Waiting for quantifERON gold test  results before discharge. Hopefully   will get the results by tomorrow.  #2. Essential hypertension improved.  #3. Acute CHF, diastolic,Echocardiogram done 10th of May 2017 revealed ejection fraction of 50%, with mild aortic regurgitation, moderate mitral valve regurgitation, severe tricuspid valve regurgitation  -Improved with hemodialysis  #4 Vomiting - resolved.  No acute issue  #5 urinary tract infection - urine culture growing Strep. Viridans.  - finished Ceftin.  #6. Dizziness-was due to some mild hypotension and now resolved.  #7. Anemia of chronic disease-hemoglobin stable. No acute need for transfusion.  - will monitor.   #8. Diabetes mellitus type 2-blood sugar stable. Continue sliding scale insulin.  #9 Hyperkalemia - resolved - was on Veltassa, now on hemodialysis and stable. Wean off oxygen.  #10 Constipation - resolved with lactulose use when necessary  LAXATIVES. DRUG ALLERGIES: No Known Allergies  CODE STATUS:     Code Status Orders        Start     Ordered   04/23/16 2030  Full code   Continuous     04/23/16 2029    Code Status History    Date Active Date Inactive Code Status Order ID Comments User Context   04/15/2016  7:18 PM 04/19/2016  4:08 PM Full Code [id number]  Gladstone Lighter, MD Inpatient      TOTAL TIME TAKING CARE OF THIS PATIENT: 25 minutes.    Epifanio Lesches M.D on 05/06/2016 at 12:00 PM  Between 7am to 6pm - Pager - [phone]  After 6pm go to [email or web address] - password [email or web address] [facility]  Office  [phone]  CC: Primary care physician; WHITE, Arlie Solomons, FNP

## 2016-05-06 NOTE — Progress Notes (Signed)
Assessment done with interpreter present.  Dr. Vianne Bulls present also.

## 2016-05-07 LAB — PHOSPHORUS: PHOSPHORUS: 3.7 mg/dL (ref 2.5–4.6)

## 2016-05-07 LAB — GLUCOSE, CAPILLARY
GLUCOSE-CAPILLARY: 137 mg/dL — AB (ref 65–99)
GLUCOSE-CAPILLARY: 128 mg/dL — AB (ref 65–99)
GLUCOSE-CAPILLARY: 149 mg/dL — AB (ref 65–99)

## 2016-05-07 LAB — CBC
HCT: 23 % — ABNORMAL LOW (ref 35.0–47.0)
HEMOGLOBIN: 7.5 g/dL — AB (ref 12.0–16.0)
MCH: 26.9 pg (ref 26.0–34.0)
MCHC: 32.5 g/dL (ref 32.0–36.0)
MCV: 82.9 fL (ref 80.0–100.0)
PLATELETS: 216 10*3/uL (ref 150–440)
RBC: 2.77 MIL/uL — ABNORMAL LOW (ref 3.80–5.20)
RDW: 16.5 % — AB (ref 11.5–14.5)
WBC: 7.2 10*3/uL (ref 3.6–11.0)

## 2016-05-07 MED ORDER — LOSARTAN POTASSIUM 50 MG PO TABS: 50.0000 mg | ORAL_TABLET | Freq: Every day | ORAL | Status: DC

## 2016-05-07 MED ORDER — NEPRO/CARBSTEADY PO LIQD: 237.0000 mL | Freq: Two times a day (BID) | ORAL | Status: DC

## 2016-05-07 NOTE — Progress Notes (Signed)
[place] at [place] NAME: Anita Contreras    MR#:  [id number]  DATE OF BIRTH:  05/14/1956  SUBJECTIVE:  CHIEF COMPLAINT:   Discharge home after HD.QUANTIFERON TEST IS NEGATIVE.  Seen during hemodialysi  Review of Systems  Constitutional: Negative for fever, chills, weight loss, malaise/fatigue and diaphoresis.  HENT: Negative for congestion.   Eyes: Negative for blurred vision and double vision.  Respiratory: Negative for cough, sputum production, shortness of breath and wheezing.   Cardiovascular: Negative for chest pain, palpitations, orthopnea, leg swelling and PND.  Gastrointestinal: Negative for nausea, vomiting, abdominal pain, diarrhea, constipation and blood in stool.  Genitourinary: Negative for urgency, frequency and hematuria.  Musculoskeletal: Negative for falls.  Neurological: Negative for dizziness, tremors, focal weakness, weakness and headaches.  Endo/Heme/Allergies: Does not bruise/bleed easily.  Psychiatric/Behavioral: Negative for depression. The patient does not have insomnia.     VITAL SIGNS: Blood pressure 156/58, pulse 80, temperature 97.7 F (36.5 C), temperature source Oral, resp. rate 18, height 4\' 5"  (1.346 m), weight 49.4 kg (108 lb 14.5 oz), last menstrual period 09/19/2001, SpO2 100 %.  PHYSICAL EXAMINATION:   GENERAL:  60 y.o.-year-old patient lying in the bed with no acute distress.  EYES: Pupils equal, round, reactive to light and accommodation. No scleral icterus. Extraocular muscles intact.  HEENT: Head atraumatic, normocephalic. Oropharynx and nasopharynx clear.  NECK:  Supple, no jugular venous distention. No thyroid enlargement, no tenderness.  LUNGS: Normal breath sounds bilaterally, minimal end-exp. Wheezing b/l, no rales, rhonchi. No use of accessory muscles of respiration.  CARDIOVASCULAR: S1, S2 normal. No murmurs, rubs, or gallops.  ABDOMEN: Soft, nontender, nondistended.  Bowel sounds present. No organomegaly or mass.  EXTREMITIES: No pedal edema, cyanosis, or clubbing. NEUROLOGIC: Cranial nerves II through XII are intact. No focal motor or sensory deficits appreciated bilaterally. PSYCHIATRIC: The patient is alert and oriented x 3.  SKIN: No obvious rash, lesion, or ulcer.   Right chest dialysis catheter in place.  ORDERS/RESULTS REVIEWED:   CBC  Recent Labs Lab 05/02/16 0312 05/04/16 1240 05/06/16 0607 05/07/16 0403  WBC 9.2 5.5 7.7 7.2  HGB 7.6* 7.3* 7.6* 7.5*  HCT 23.5* 22.6* 23.6* 23.0*  PLT 197 195 224 216  MCV 84.9 84.7 84.4 82.9  MCH 27.6 27.4 27.1 26.9  MCHC 32.5 32.4 32.2 32.5  RDW 15.7* 16.3* 16.2* 16.5*   ------------------------------------------------------------------------------------------------------------------  Chemistries   Recent Labs Lab 05/04/16 1242  NA 138  K 3.4*  CL 99*  CO2 35*  GLUCOSE 178*  BUN 14  CREATININE 2.46*  CALCIUM 7.7*   ------------------------------------------------------------------------------------------------------------------ estimated creatinine clearance is 14.4 mL/min (by C-G formula based on Cr of 2.46). ------------------------------------------------------------------------------------------------------------------ No results for input(s): TSH, T4TOTAL, T3FREE, THYROIDAB in the last 72 hours.  Invalid input(s): FREET3  Cardiac Enzymes No results for input(s): CKMB, TROPONINI, MYOGLOBIN in the last 168 hours.  Invalid input(s): CK ------------------------------------------------------------------------------------------------------------------ Invalid input(s): POCBNP ---------------------------------------------------------------------------------------------------------------  RADIOLOGY: No results found.  ASSESSMENT AND PLAN:  Principal Problem:   Acute on chronic renal failure (HCC)  #1. Acute on chronic renal failure with known history of CKD stage III - did not  improve w/ conventional treatment and progressed to ESRD on HD.  Likely cause of CKD is underlying diabetic Nephropathy. - tolerating HD .Marland Kitchen    - appreciate Nephro input and pt. Has a spot at [facility] HD .dischargeafter HD today,next HD on Thursday   d #2. Essential hypertension improved.  #3. Acute CHF, diastolic,Echocardiogram  done 10th of May 2017 revealed ejection fraction of 50%, with mild aortic regurgitation, moderate mitral valve regurgitation, severe tricuspid valve regurgitation  -Improved with hemodialysis  #4 Vomiting - resolved.  No acute issue  #5 urinary tract infection - urine culture growing Strep. Viridans.  - finished Ceftin.  #6. Dizziness-was due to some mild hypotension and now resolved.  #7. Anemia of chronic disease-hemoglobin stable. No acute need for transfusion.  - will monitor.   #8. Diabetes mellitus type 2-blood sugar stable. Continue sliding scale insulin.  #9 Hyperkalemia - resolved - was on Veltassa, now on hemodialysis and stable. Wean off oxygen.  #10 Constipation - resolved with lactulose use when necessary  LAXATIVES. DRUG ALLERGIES: No Known Allergies  CODE STATUS:     Code Status Orders        Start     Ordered   04/23/16 2030  Full code   Continuous     04/23/16 2029    Code Status History    Date Active Date Inactive Code Status Order ID Comments User Context   04/15/2016  7:18 PM 04/19/2016  4:08 PM Full Code [id number]  Gladstone Lighter, MD Inpatient    D/c home today.  TOTAL TIME TAKING CARE OF THIS PATIENT: 25 minutes.    Epifanio Lesches M.D on 05/07/2016 at 11:08 PM  Between 7am to 6pm - Pager - [phone]  After 6pm go to [email or web address] - password [email or web address] [facility]  Office  [phone]  CC: Primary care physician; WHITE, Arlie Solomons, FNP

## 2016-05-07 NOTE — Progress Notes (Signed)
Post dialysis 

## 2016-05-07 NOTE — Progress Notes (Signed)
Pre Dialysis 

## 2016-05-07 NOTE — Progress Notes (Signed)
Dialysis started 

## 2016-05-07 NOTE — Progress Notes (Signed)
Post Dialysis 

## 2016-05-07 NOTE — Progress Notes (Signed)
MEDICATION RELATED CONSULT NOTE - INITIAL   Pharmacy Consult for phosphorus replacement   No Known Allergies  Patient Measurements: Height: 4\' 5"  (134.6 cm) Weight: 110 lb 10.7 oz (50.2 kg) IBW/kg (Calculated) : 29.4  Labs:  Recent Labs  05/04/16 1240 05/04/16 1242  05/05/16 0804 05/06/16 0604 05/06/16 0607 05/07/16 0403  WBC 5.5  --   --   --   --  7.7 7.2  HGB 7.3*  --   --   --   --  7.6* 7.5*  HCT 22.6*  --   --   --   --  23.6* 23.0*  PLT 195  --   --   --   --  224 216  CREATININE  --  2.46*  --   --   --   --   --   PHOS  --  <1.0*  < > 4.5 3.6  --  3.7  ALBUMIN  --  2.6*  --   --   --   --   --   < > = values in this interval not displayed. Estimated Creatinine Clearance: 14.5 mL/min (by C-G formula based on Cr of 2.46).   Assessment: Patient is a 60 yo female admitted for acute on chronic renal failure.  Patient transitioned to HD.  MD requested phosphorus replacement for phos <1 on 5/27.  Discussed with nephrology on 5/27 and follow up level obtained after dialysis of 1.2.  Per nephrology may supplement with a dose of sodium phosphate 26 mmol.    Phosphorus today of 3.7   Plan:  Will continue to follow phosphorus level.  Phosphorus today within normal limits.  Patient is ESRD requiring HD.  Will follow up with a level in AM.  Contact nephrology prior to ordering supplementation.   Soley Harriss G 05/07/2016,7:51 AM

## 2016-05-07 NOTE — Progress Notes (Addendum)
Patient discharge teaching given with medical interpretor at bedside, including activity, diet, follow-up appoints, Home Health, dialysis treatment days,  and medications. Patient verbalized understanding of all discharge instructions. IV access was d/c'd, right perm cath dressing was clean and intact. Vitals are stable. Skin is intact except as charted in most recent assessments. Awaiting family member to arrive to take pt home.  Angus Seller

## 2016-05-07 NOTE — Progress Notes (Signed)
Subjective:   Seen and examined on hemodialysis. Tolerating treatment well.     HEMODIALYSIS FLOWSHEET:  Blood Flow Rate (mL/min): 400 mL/min Arterial Pressure (mmHg): -160 mmHg Venous Pressure (mmHg): 120 mmHg Transmembrane Pressure (mmHg): 50 mmHg Ultrafiltration Rate (mL/min): 570 mL/min Dialysate Flow Rate (mL/min): 800 ml/min Conductivity: Machine : 13.9 Conductivity: Machine : 13.9 Dialysis Fluid Bolus: Normal Saline Bolus Amount (mL): 250 mL Dialysate Change: 2K Intra-Hemodialysis Comments: 943. Resting     Objective:  Vital signs in last 24 hours:  Temp:  [96.6 F (35.9 C)-98.6 F (37 C)] 97.8 F (36.6 C) (05/30 0946) Pulse Rate:  [71-77] 75 (05/30 1130) Resp:  [15-24] 16 (05/30 1130) BP: (140-170)/(52-63) 170/59 mmHg (05/30 1130) SpO2:  [94 %-100 %] 99 % (05/30 1130) Weight:  [50.9 kg (112 lb 3.4 oz)] 50.9 kg (112 lb 3.4 oz) (05/30 0946)  Weight change:  Filed Weights   05/04/16 1220 05/04/16 1600 05/07/16 0946  Weight: 52.4 kg (115 lb 8.3 oz) 50.2 kg (110 lb 10.7 oz) 50.9 kg (112 lb 3.4 oz)    Intake/Output:    Intake/Output Summary (Last 24 hours) at 05/07/16 1151 Last data filed at 05/07/16 0500  Gross per 24 hour  Intake    480 ml  Output      0 ml  Net    480 ml     Physical Exam: General: Laying in the bed, no acute distress  HEENT Anicteric, moist oral mucous membranes  Neck supple  Pulm/lungs Normal effort, room air, clear to auscultation  CVS/Heart Regular no rubs  Abdomen:  Soft, nontender, nondistended  Extremities: No peripheral edema  Neurologic: Alert, oriented, follows commands  Skin: No acute rashes  Access:  Rt IJ PC/Dr Dew / 5/18       Basic Metabolic Panel:   Recent Labs Lab 05/04/16 1242 05/04/16 1755 05/05/16 0804 05/06/16 0604 05/07/16 0403  NA 138  --   --   --   --   K 3.4*  --   --   --   --   CL 99*  --   --   --   --   CO2 35*  --   --   --   --   GLUCOSE 178*  --   --   --   --   BUN 14  --   --    --   --   CREATININE 2.46*  --   --   --   --   CALCIUM 7.7*  --   --   --   --   PHOS <1.0* 1.2* 4.5 3.6 3.7     CBC:  Recent Labs Lab 05/02/16 0312 05/04/16 1240 05/06/16 0607 05/07/16 0403  WBC 9.2 5.5 7.7 7.2  HGB 7.6* 7.3* 7.6* 7.5*  HCT 23.5* 22.6* 23.6* 23.0*  MCV 84.9 84.7 84.4 82.9  PLT 197 195 224 216      Microbiology:  Recent Results (from the past 720 hour(s))  Urine culture     Status: Abnormal   Collection Time: 04/23/16  9:43 PM  Result Value Ref Range Status   Specimen Description URINE, CATHETERIZED  Final   Special Requests NONE  Final   Culture >=100,000 COLONIES/mL VIRIDANS STREPTOCOCCUS (A)  Final   Report Status 04/27/2016 FINAL  Final  MRSA PCR Screening     Status: None   Collection Time: 04/25/16  6:45 AM  Result Value Ref Range Status   MRSA by PCR NEGATIVE NEGATIVE Final  Comment:        The GeneXpert MRSA Assay (FDA approved for NASAL specimens only), is one component of a comprehensive MRSA colonization surveillance program. It is not intended to diagnose MRSA infection nor to guide or monitor treatment for MRSA infections.     Coagulation Studies: No results for input(s): LABPROT, INR in the last 72 hours.  Urinalysis: No results for input(s): COLORURINE, LABSPEC, PHURINE, GLUCOSEU, HGBUR, BILIRUBINUR, KETONESUR, PROTEINUR, UROBILINOGEN, NITRITE, LEUKOCYTESUR in the last 72 hours.  Invalid input(s): APPERANCEUR    Imaging: No results found.   Medications:     . amLODipine  10 mg Oral Daily  . atorvastatin  40 mg Oral QHS  . epoetin (EPOGEN/PROCRIT) injection  10,000 Units Intravenous Q T,Th,Sa-HD  . feeding supplement (NEPRO CARB STEADY)  237 mL Oral BID BM  . heparin  5,000 Units Subcutaneous Q8H  . hydrALAZINE  50 mg Oral Q8H  . insulin aspart  0-9 Units Subcutaneous TID WC  . losartan  50 mg Oral Daily  . tuberculin  5 Units Intradermal Once     Assessment/ Plan:  60 y.o. Hispanic female with insulin  dependent diabetes mellitus type II, diabetic retinopathy, hypertension, hyperlipidemia, hemorrhagic cystitis, migraine headaches, osteoarthritis, osteoporosis, ESRD first HD 04/25/16.  1. ESRD:  Secondary to diabetic nephropathy and hypertension. GI complaints could be uremic symptoms. first hemodialysis session 5/18.  - Continue TTS schedule.  -- D/c planning for [facility]  2. Hypertension:  -  Continue amlodipine, hydralazine, and losartan.     3. Diabetes Mellitus type II with chronic kidney disease: insulin dependent. Hemoglobin A1c 7.2% - Continue glucose control  4. Secondary Hyperparathyroidism: PTH 105, phos at goal.   6. Anemia with chronic kidney disease: hemoglobin 7.5 - Continue Epogen with dialysis.   LOS: 14 Damiano Stamper 5/30/201711:51 AM

## 2016-05-07 NOTE — Care Management (Addendum)
For discharge today as quantiferon  test is negative.  No issues obtaining script for cozaar.  Anita Contreras has given instruction regarding first outpatient dialysis appointment at [facility] in [place].  Requested orders for home health SN PT and SW

## 2016-05-07 NOTE — Progress Notes (Signed)
Pt was escorted out with NT in wheelchair to be driven home by daughter.   Anita Contreras

## 2016-05-10 NOTE — Discharge Summary (Signed)
Anita Contreras, is a 60 y.o. female  DOB 1956-06-22  MRN [id number].  Admission date:  04/23/2016  Admitting Physician  Vaughan Basta, MD  Discharge Date:  05/07/2016   Primary MD  WHITE, Arlie Solomons, FNP  Recommendations for primary care physician for things to follow:   Follow-up with nephrology as an outpatient for dialysis needs. Also follow-up with primary doctor..   Admission Diagnosis  Bradycardia [R00.1] Renal failure [N19] Weakness [R53.1] Acute on chronic renal failure (HCC) [N17.9, N18.9]   Discharge Diagnosis  Bradycardia [R00.1] Renal failure [N19] Weakness [R53.1] Acute on chronic renal failure (HCC) [N17.9, N18.9]    Principal Problem:   Acute on chronic renal failure [facility])      Past Medical History  Diagnosis Date  . Acute cystitis with hematuria   . Neuropathy ([place])   . Migraine variant   . Cataracts, bilateral   . Diabetic retinopathy, background ([place])   . Dizziness   . Vitamin D deficiency   . Personal history of noncompliance with medical treatment, presenting hazards to health   . Type II diabetes mellitus with ophthalmic manifestations, uncontrolled ([place])   . Hyperlipidemia   . OP (osteoporosis)   . Hypertension   . CKD (chronic kidney disease)     Past Surgical History  Procedure Laterality Date  . Peripheral vascular catheterization N/A 04/25/2016    Procedure: Dialysis/Perma Catheter Insertion;  Surgeon: Algernon Huxley, MD;  Location: [place] CV LAB;  Service: Cardiovascular;  Laterality: N/A;       History of present illness and  Hospital Course:     Kindly see H&P for history of present illness and admission details, please review complete Labs, Consult reports and Test reports for all details in brief  HPI  from the history and physical done on the  day of admission  60 year old female patient admitted for dizziness. Patient had previous admission significant for malignant hypertension discharge with medication for blood pressure. Admitted for acute on chronic renal failure  Hospital Course   1 dizziness; secondary to hypertension: Improved by holding the BP medications. She also had bradycardia with heart rate 47 on admission. She was given recently beta blockers for hypertension and hypertensive urgency. So on admission her dizziness thought to be due to low blood pressure and also bradycardia. Metoprolol, hydralazine that were given during previous admission were held. Rate, blood pressure improved. Will discharge patient blood pressure, heart rate improved heart rate 81. #2. #2 difficulty breathing, due to acute on chronic renal failure with chronic kidney disease stage III with proteinuria. Baseline creatinine 1.79 and GFR 30. On admission at 4.26 and BUN 71. Also had hyponatremia with sodium 128, potassium 5.1. Patient has seen by nephrology. Started on dialysis. Patient had right IJ permacath on 18th of the May. And she started on dialysis. On first dialysis was on May 18. Tolerated the dialysis well. And discharged home, has outpatient hemodialysis chair at [facility]. Tuesday, Thursday, Saturday. Patient: quantiferon  test for TB was negative. 3: UTI due to strep viridans. Patient finished ceftin . #4 acute blood on chronic diastolic heart failure: EF 50% by echo. Shortness of breath improved with hemodialysis.  Improved with hemodialysis. #5 diabetes mellitus type 2: She is on. 70 /30   15 units daily. #6 hypophosphatemia improved after holding the PhosLo  used hospital spanish  Translator, Discharge Condition: stable   Follow UP  Follow-up Information    Follow up with [facility]. Go on 05/09/2016.  Why:  time: 5:45 AM   Contact information:   [place] [address] [phone]       Follow up with [facility].   Why:  Home Health Nurse   Contact information:   [address] [place] [address] [phone]         Discharge Instructions  and  Discharge Medications  Discharge home with home health physical therapy, social worker, nursing.      Medication List    STOP taking these medications        meclizine 25 MG tablet  Commonly known as:  ANTIVERT      TAKE these medications        acetaminophen 500 MG tablet  Commonly known as:  TYLENOL  Take 1,000 mg by mouth every 6 (six) hours as needed for mild pain or headache.     amLODipine 10 MG tablet  Commonly known as:  NORVASC  Take 1 tablet (10 mg total) by mouth daily.     atorvastatin 40 MG tablet  Commonly known as:  LIPITOR  Take 40 mg by mouth at bedtime.     calcium acetate 667 MG capsule  Commonly known as:  PHOSLO  Take 1 capsule (667 mg total) by mouth 3 (three) times daily with meals.     feeding supplement (NEPRO CARB STEADY) Liqd  Take 237 mLs by mouth 2 (two) times daily between meals.     hydrALAZINE 25 MG tablet  Commonly known as:  APRESOLINE  Take 1 tablet (25 mg total) by mouth 3 (three) times daily.     insulin NPH-regular Human (70-30) 100 UNIT/ML injection  Commonly known as:  NOVOLIN 70/30  Inject 15 Units into the skin daily.     losartan 50 MG tablet  Commonly known as:  COZAAR  Take 1 tablet (50 mg total) by mouth daily.     metoprolol tartrate 25 MG tablet  Commonly known as:  LOPRESSOR  Take 1 tablet (25 mg total) by mouth 2 (two) times daily.          Diet and Activity recommendation: See Discharge Instructions above   Consults obtained - Nephrology, vascular   Major procedures and Radiology Reports - PLEASE review detailed and final reports for all details, in brief -     Ct Abdomen Pelvis Wo Contrast  04/15/2016  CLINICAL DATA:  Right lower quadrant pain for 3 months EXAM: CT ABDOMEN AND PELVIS WITHOUT CONTRAST TECHNIQUE: Multidetector  CT imaging of the abdomen and pelvis was performed following the standard protocol without IV contrast. COMPARISON:  None. FINDINGS: Lower chest:  Small pleural effusions are identified bilaterally. Hepatobiliary: No suspicious liver abnormalities identified. The gallbladder appears normal. There is no biliary dilatation. Pancreas: No mass or inflammatory process identified on this un-enhanced exam. Spleen: Within normal limits in size. Adrenals/Urinary Tract: No evidence of urolithiasis or hydronephrosis. No definite mass visualized on this un-enhanced exam. Stomach/Bowel: The stomach is within normal limits. The small bowel loops have a normal course and caliber. No obstruction. Normal appearance of the colon. The appendix is visualized and appears normal. Vascular/Lymphatic: Calcified atherosclerotic disease involves the abdominal aorta. No aneurysm. No enlarged retroperitoneal or mesenteric adenopathy. No enlarged pelvic or inguinal lymph nodes. Reproductive: No mass or other significant abnormality. Other: There is no ascites or focal fluid collections within the abdomen or pelvis. There is a umbilical hernia which contains fat only. Musculoskeletal: Degenerative disc disease is noted within  the lower lumbar spine at the L5-S1 level. IMPRESSION: 1. No acute findings identified within the abdomen or pelvis. 2. Aortic atherosclerosis 3. Small umbilical hernia contains fat only. 4. Small bilateral pleural effusions. Electronically Signed   By: Kerby Moors M.D.   On: 04/15/2016 14:32   Dg Chest 1 View  04/29/2016  CLINICAL DATA:  Cough EXAM: CHEST 1 VIEW COMPARISON:  04/23/2016 chest radiograph. FINDINGS: Right internal jugular central venous catheter terminates in the right atrium. Stable cardiomediastinal silhouette with mild cardiomegaly. No pneumothorax. Small bilateral pleural effusions, decreased bilaterally. Mild pulmonary edema, slightly improved. Patchy bibasilar lung opacities, slightly improved  bilaterally. IMPRESSION: 1. Mild congestive heart failure, slightly improved . 2. Small bilateral pleural effusions, decreased bilaterally. 3. Patchy bibasilar lung opacities, slightly improved bilaterally, favor atelectasis. Electronically Signed   By: Ilona Sorrel M.D.   On: 04/29/2016 14:41   Dg Chest 1 View  04/23/2016  CLINICAL DATA:  Dizziness and weakness EXAM: CHEST 1 VIEW COMPARISON:  None. FINDINGS: Cardiac shadow is enlarged. Bilateral pleural effusions and bibasilar atelectatic changes are noted. Mild vascular congestion is seen as well. No bony abnormality is noted. IMPRESSION: Changes of CHF with bibasilar effusions. Electronically Signed   By: Inez Catalina M.D.   On: 04/23/2016 18:19   Ct Head Wo Contrast  04/18/2016  CLINICAL DATA:  60 year old female with recent vertigo. Found have mild cerebellar edema associated with a punctate age indeterminate cerebellar hemorrhage by MRI. Initial encounter. EXAM: CT HEAD WITHOUT CONTRAST TECHNIQUE: Contiguous axial images were obtained from the base of the skull through the vertex without intravenous contrast. COMPARISON:  Brain MRI 04/17/2016. FINDINGS: Visualized paranasal sinuses and mastoids are clear. No acute osseous abnormality identified. Visualized orbits and scalp soft tissues are within normal limits. Cerebral volume is within normal limits for age. No intracranial mass effect. The area of abnormality in the inferior right cerebellum is evident as mild hypodensity on CT (series 2, image 7) without associated visible blood products by CT. No associated mass effect. Cerebellar and brainstem gray-white matter differentiation otherwise is normal. Normal supratentorial gray-white matter differentiation. No new cortically based infarct. No hyperdense intracranial hemorrhage identified. IMPRESSION: Recently seen right cerebellar abnormality is visible on CT as a small area of mild hypodensity. No macroscopic hemorrhage or other acute intracranial  abnormality evident by CT. Electronically Signed   By: Genevie Ann M.D.   On: 04/18/2016 11:32   [name]  04/17/2016  CLINICAL DATA:  Vertigo.  Hypertensive urgency and renal failure. EXAM: MRI HEAD WITHOUT CONTRAST TECHNIQUE: Multiplanar, multiecho pulse sequences of the brain and surrounding structures were obtained without intravenous contrast. COMPARISON:  None. FINDINGS: There is mild motion artifact throughout. There is no evidence of acute infarct, midline shift, or extra-axial fluid collection. Slight diffuse sulcal prominence is compatible with mild cerebral atrophy. Ventricles are normal in size. No significant cerebral white matter disease is seen. There is a 3 mm focus of susceptibility artifact in the inferior right cerebellum with mild surrounding T2 hyperintensity/edema. There is no evidence of intracranial hemorrhage elsewhere. Orbits are unremarkable. There is mild left maxillary sinus mucosal thickening. Mastoid air cells are clear. Major intracranial vascular flow voids are preserved. IMPRESSION: 1. 3 mm focus of blood products in the right cerebellum. This is of uncertain age by MRI, however a small acute/subacute hemorrhage is a concern given evidence of mild surrounding edema and patient's current vertigo. This may be hypertensive in etiology or possibly due to an underlying cavernoma. Noncontrast  head CT is recommended. 2. No other evidence of acute intracranial abnormality. These results will be called to the ordering clinician or representative by the Radiologist Assistant, and communication documented in the PACS or zVision Dashboard. Electronically Signed   By: Logan Bores M.D.   On: 04/17/2016 12:12   US Pelvis Complete  04/16/2016  CLINICAL DATA:  Two days of vaginal bleeding ; the patient was unable to void for the endovaginal portion of the study. EXAM: TRANSABDOMINAL ULTRASOUND OF PELVIS TECHNIQUE: Transabdominal ultrasound examination of the pelvis was performed  including evaluation of the uterus, ovaries, adnexal regions, and pelvic cul-de-sac. COMPARISON:  Abdominal and pelvic CT scan of Apr 15, 2016. FINDINGS: Uterus Measurements: 5.6 x 2.9 x 4.6 cm. No fibroids or other mass visualized. Endometrium Thickness: 2.0 mm.  No focal abnormality visualized. Right ovary Measurements: 2.3 x 1.1 x 1.8 cm. Normal appearance/no adnexal mass. Left ovary Measurements: 1.7 x 1.0 x 1.4 cm. Normal appearance/no adnexal mass. Other findings:  No abnormal free fluid. IMPRESSION: Normal appearance of the uterus, the endometrium, and both ovaries. No endometrial masses or other abnormalities are observed. Electronically Signed   By: David  Martinique M.D.   On: 04/16/2016 10:53   US Renal  04/24/2016  CLINICAL DATA:  Renal failure, type II diabetes mellitus, chronic kidney disease, hyperlipidemia, hypertension EXAM: RENAL / URINARY TRACT ULTRASOUND COMPLETE COMPARISON:  CT abdomen and pelvis 04/15/2016 FINDINGS: Right Kidney: Length: 9.2 cm. Normal cortical thickness. Increased cortical echogenicity. Small amount of perinephric fluid. No mass, hydronephrosis or shadowing calcification. Left Kidney: Length: 10.1 cm. Normal cortical thickness. Increased cortical echogenicity. No mass, hydronephrosis or shadowing calcification. Bladder: Appears normal for degree of bladder distention. Incidentally noted small amount of ascites in RIGHT upper quadrant and a small LEFT pleural effusion. IMPRESSION: Medical renal disease changes of both kidneys. Small amount of perinephric fluid at RIGHT kidney, nonspecific and new since 04/15/2016 CT exam. Electronically Signed   By: Lavonia Dana M.D.   On: 04/24/2016 18:31   US Carotid Bilateral  04/17/2016  CLINICAL DATA:  Intracranial hemorrhage EXAM: BILATERAL CAROTID DUPLEX ULTRASOUND TECHNIQUE: Pearline Cables scale imaging, color Doppler and duplex ultrasound was performed of bilateral carotid and vertebral arteries in the neck. COMPARISON:  None. REVIEW OF SYSTEMS:  Quantification of carotid stenosis is based on velocity parameters that correlate the residual internal carotid diameter with NASCET-based stenosis levels, using the diameter of the distal internal carotid lumen as the denominator for stenosis measurement. The following velocity measurements were obtained: PEAK SYSTOLIC/END DIASTOLIC RIGHT ICA:                     128/45cm/sec CCA:                     [id number] SYSTOLIC ICA/CCA RATIO:  [id number] DIASTOLIC ICA/CCA RATIO: [id number] ECA:                     166cm/sec LEFT ICA:                     165/46cm/sec CCA:                     [id number] SYSTOLIC ICA/CCA RATIO:  [id number] DIASTOLIC ICA/CCA RATIO: [id number] ECA:                     177cm/sec FINDINGS: RIGHT CAROTID ARTERY: Mild eccentric partially calcified plaque in the bulb and ICA origin. No  high-grade stenosis. Normal waveforms and color Doppler signal. RIGHT VERTEBRAL ARTERY:  Normal flow direction and waveform. LEFT CAROTID ARTERY: Calcified plaque in the carotid bulb extending to involve proximal internal and external carotid arteries. No high-grade ICA stenosis. Normal waveforms and color Doppler signal. LEFT VERTEBRAL ARTERY: Normal flow direction and waveform. IMPRESSION: 1. Mild bilateral carotid bifurcation and proximal ICA plaque, resulting in less than 50% diameter stenosis. The exam does not exclude plaque ulceration or embolization. Continued surveillance recommended. 2.  Antegrade bilateral vertebral arterial flow. Electronically Signed   By: Lucrezia Europe M.D.   On: 04/17/2016 15:08   US Aorta  04/15/2016  CLINICAL DATA:  Abdominal pain.  Evaluate for aortic dissection. EXAM: ULTRASOUND OF ABDOMINAL AORTA TECHNIQUE: Ultrasound examination of the abdominal aorta was performed to evaluate for abdominal aortic aneurysm. COMPARISON:  Noncontrast abdominal CT 04/15/2016 FINDINGS: Abdominal Aorta No abdominal aortic aneurysm. The distal abdominal aorta measures up to 1.3 cm with atherosclerotic disease. Mid abdominal  aorta measures up to 1.4 cm. Maximum size of the proximal abdominal aorta is 1.8 cm. This modality is not optimal for evaluation for an aortic dissection. However, a dissection is thought to be unlikely based on the size of this vessel and there is no clear evidence for a false channel. Iliac arteries: Right common iliac artery measures up to 0.8 cm with atherosclerotic calcifications. The right common iliac artery is patent. Left common iliac artery measures up to the 0.7 cm and patent. IMPRESSION: Negative for an abdominal aortic aneurysm. The abdominal aorta is small with atherosclerotic disease. Limited evaluation for an aortic dissection based on the modality and size of the aorta. An aortic dissection is thought to be unlikely based on the color Doppler images and size of the aorta. However, noncontrast MR would be more definitive evaluation for an aortic dissection if the patient cannot receive intravenous contrast. Electronically Signed   By: Markus Daft M.D.   On: 04/15/2016 16:01    Micro Results     No results found for this or any previous visit (from the past 240 hour(s)).     Today   Subjective:   Anita Contreras today has no headache,no chest abdominal pain,no new weakness tingling or numbness, feels much better wants to go home today. *  Objective:   Blood pressure 156/58, pulse 80, temperature 97.7 F (36.5 C), temperature source Oral, resp. rate 18, height 4\' 5"  (1.346 m), weight 49.4 kg (108 lb 14.5 oz), last menstrual period 09/19/2001, SpO2 100 %.  No intake or output data in the 24 hours ending 05/10/16 0624  Exam Awake Alert, Oriented x 3, No new F.N deficits, Normal affect [place].AT,PERRAL Supple Neck,No JVD, No cervical lymphadenopathy appriciated.  Symmetrical Chest wall movement, Good air movement bilaterally, CTAB RRR,No Gallops,Rubs or new Murmurs, No Parasternal Heave +ve B.Sounds, Abd Soft, Non tender, No organomegaly appriciated, No rebound -guarding  or rigidity. No Cyanosis, Clubbing or edema, No new Rash or bruise  Data Review   CBC w Diff:  Lab Results  Component Value Date   WBC 7.2 05/07/2016   HGB 7.5* 05/07/2016   HCT 23.0* 05/07/2016   PLT 216 05/07/2016    CMP:  Lab Results  Component Value Date   NA 138 05/04/2016   K 3.4* 05/04/2016   CL 99* 05/04/2016   CO2 35* 05/04/2016   BUN 14 05/04/2016   CREATININE 2.46* 05/04/2016   PROT 6.4* 04/15/2016   ALBUMIN 2.6* 05/04/2016   BILITOT 0.8 04/15/2016  ALKPHOS 131* 04/15/2016   AST 19 04/15/2016   ALT 13* 04/15/2016  .   Total Time in preparing paper work, data evaluation and todays exam - 32 minutes  Lahari Suttles M.D on 05/07/2016 at 6:24 AM    Note: This dictation was prepared with Dragon dictation along with smaller phrase technology. Any transcriptional errors that result from this process are unintentional.

## 2016-05-12 DIAGNOSIS — D509 Iron deficiency anemia, unspecified: Secondary | ICD-10-CM | POA: Insufficient documentation

## 2016-05-12 DIAGNOSIS — D631 Anemia in chronic kidney disease: Secondary | ICD-10-CM | POA: Insufficient documentation

## 2016-05-20 ENCOUNTER — Other Ambulatory Visit
Admission: RE | Admit: 2016-05-20 | Discharge: 2016-05-20 | Disposition: A | Discharge status: Home / Self Care | Payer: MEDICAID | Source: Ambulatory Visit | Attending: Nephrology | Admitting: Nephrology

## 2016-05-20 LAB — HEMOGLOBIN: HEMOGLOBIN: 8.2 g/dL — AB (ref 12.0–16.0)

## 2016-05-21 ENCOUNTER — Emergency Department: Payer: Self-pay

## 2016-05-21 ENCOUNTER — Emergency Department
Admission: EM | Admit: 2016-05-21 | Discharge: 2016-05-22 | Disposition: A | Discharge status: Home / Self Care | Payer: Self-pay | Attending: Emergency Medicine | Admitting: Emergency Medicine

## 2016-05-21 DIAGNOSIS — R519 Headache, unspecified: Secondary | ICD-10-CM

## 2016-05-21 DIAGNOSIS — E162 Hypoglycemia, unspecified: Secondary | ICD-10-CM

## 2016-05-21 DIAGNOSIS — E1122 Type 2 diabetes mellitus with diabetic chronic kidney disease: Secondary | ICD-10-CM | POA: Insufficient documentation

## 2016-05-21 DIAGNOSIS — E785 Hyperlipidemia, unspecified: Secondary | ICD-10-CM | POA: Insufficient documentation

## 2016-05-21 DIAGNOSIS — R51 Headache: Secondary | ICD-10-CM

## 2016-05-21 DIAGNOSIS — M810 Age-related osteoporosis without current pathological fracture: Secondary | ICD-10-CM | POA: Insufficient documentation

## 2016-05-21 DIAGNOSIS — N189 Chronic kidney disease, unspecified: Secondary | ICD-10-CM | POA: Insufficient documentation

## 2016-05-21 DIAGNOSIS — Z794 Long term (current) use of insulin: Secondary | ICD-10-CM | POA: Insufficient documentation

## 2016-05-21 DIAGNOSIS — E11649 Type 2 diabetes mellitus with hypoglycemia without coma: Secondary | ICD-10-CM | POA: Insufficient documentation

## 2016-05-21 DIAGNOSIS — Z79899 Other long term (current) drug therapy: Secondary | ICD-10-CM | POA: Insufficient documentation

## 2016-05-21 DIAGNOSIS — E11319 Type 2 diabetes mellitus with unspecified diabetic retinopathy without macular edema: Secondary | ICD-10-CM | POA: Insufficient documentation

## 2016-05-21 LAB — CBC WITH DIFFERENTIAL/PLATELET
BASOS ABS: 0 10*3/uL (ref 0–0.1)
Basophils Relative: 1 %
EOS ABS: 0.1 10*3/uL (ref 0–0.7)
EOS PCT: 2 %
HCT: 26.6 % — ABNORMAL LOW (ref 35.0–47.0)
Hemoglobin: 8.7 g/dL — ABNORMAL LOW (ref 12.0–16.0)
LYMPHS ABS: 0.9 10*3/uL — AB (ref 1.0–3.6)
Lymphocytes Relative: 14 %
MCH: 27.3 pg (ref 26.0–34.0)
MCHC: 32.8 g/dL (ref 32.0–36.0)
MCV: 83 fL (ref 80.0–100.0)
Monocytes Absolute: 0.5 10*3/uL (ref 0.2–0.9)
Monocytes Relative: 7 %
Neutro Abs: 5.1 10*3/uL (ref 1.4–6.5)
Neutrophils Relative %: 76 %
PLATELETS: 272 10*3/uL (ref 150–440)
RBC: 3.2 MIL/uL — AB (ref 3.80–5.20)
RDW: 18.4 % — ABNORMAL HIGH (ref 11.5–14.5)
WBC: 6.6 10*3/uL (ref 3.6–11.0)

## 2016-05-21 LAB — COMPREHENSIVE METABOLIC PANEL
ALT: 48 U/L (ref 14–54)
AST: 46 U/L — ABNORMAL HIGH (ref 15–41)
Albumin: 3.9 g/dL (ref 3.5–5.0)
Alkaline Phosphatase: 268 U/L — ABNORMAL HIGH (ref 38–126)
Anion gap: 10 (ref 5–15)
BUN: 15 mg/dL (ref 6–20)
CHLORIDE: 99 mmol/L — AB (ref 101–111)
CO2: 27 mmol/L (ref 22–32)
CREATININE: 1.98 mg/dL — AB (ref 0.44–1.00)
Calcium: 8.8 mg/dL — ABNORMAL LOW (ref 8.9–10.3)
GFR calc non Af Amer: 26 mL/min — ABNORMAL LOW (ref >60)
GFR, EST AFRICAN AMERICAN: 30 mL/min — AB (ref >60)
Glucose, Bld: 141 mg/dL — ABNORMAL HIGH (ref 65–99)
Potassium: 3.9 mmol/L (ref 3.5–5.1)
SODIUM: 136 mmol/L (ref 135–145)
Total Bilirubin: 0.4 mg/dL (ref 0.3–1.2)
Total Protein: 7.3 g/dL (ref 6.5–8.1)

## 2016-05-21 LAB — GLUCOSE, CAPILLARY: Glucose-Capillary: 138 mg/dL — ABNORMAL HIGH (ref 65–99)

## 2016-05-21 MED ORDER — HYDROCODONE-ACETAMINOPHEN 5-325 MG PO TABS
1.0000 | ORAL_TABLET | Freq: Once | ORAL | Status: AC
Start: 2016-05-21 — End: 2016-05-21
  Administered 2016-05-21: 1 via ORAL
  Filled 2016-05-21: qty 1

## 2016-05-21 NOTE — ED Notes (Signed)
Dimarco Minkin RN checked the Pt's BG and it was 138. Dr. Marcelene Butte was notified.

## 2016-05-21 NOTE — ED Notes (Signed)
Per EMS pt c/o headache and upon eval they found a FSBS of 21 - They admin one amp of D50 and FSBS 335 - 20g IV in lt wrist - EKG WNL per EMS - Interpreter requested

## 2016-05-21 NOTE — ED Notes (Addendum)
Interpreter at bedside - at this time no leg pain - it comes and goes

## 2016-05-21 NOTE — ED Notes (Addendum)
Per EMS pt c/o headache and upon eval they found a FSBS of 21 - They admin one amp of D50 and FSBS 335 - 20g IV in lt wrist - EKG WNL per EMS - Interpreter requested Mallie Mussel RN at bedside to speak with pt at this time - per granddaughter she has had right leg pain all day - she feels like "her tendon or veins are constricting" - pt is a dialysis pt and goes Tues, [place], Sat

## 2016-05-21 NOTE — ED Notes (Signed)
Per Dr Marcelene Butte graham crackers and peanut butter given to pt

## 2016-05-22 ENCOUNTER — Emergency Department: Payer: Self-pay

## 2016-05-22 MED ORDER — TRAMADOL HCL 50 MG PO TABS: 50.0000 mg | ORAL_TABLET | Freq: Four times a day (QID) | ORAL | Status: DC | PRN

## 2016-05-22 NOTE — ED Provider Notes (Signed)
Time Seen: Approximately *2040  I have reviewed the triage notes  Chief Complaint: Hypoglycemia   History of Present Illness: Anita Contreras is a 60 y.o. female *who has a history of multiple medical problems including chronic neuropathy, migraines, diabetes with difficult to control with visual manifestations. Patient's does not speak any English currently history review of systems etc. acquired through interpreter. She apparently had dialysis earlier today and normally has her dialysis on Tuesday, Thursday, and Saturdays. Patient apparently tolerated her dialysis fine. When she got home she developed a posterior headache which radiates toward the front behind both eyes. She's had some mild visual disturbances which is not atypical for her. She also complains of some right leg pain without swelling. The patient denies any focal weakness in either upper or lower extremities. EMS was notified and she was found to have a blood glucose level of 12 and was given appropriate hypoglycemia medications and currently arrives with the elevated blood sugar.   Past Medical History  Diagnosis Date  . Acute cystitis with hematuria   . Neuropathy ([place])   . Migraine variant   . Cataracts, bilateral   . Diabetic retinopathy, background ([place])   . Dizziness   . Vitamin D deficiency   . Personal history of noncompliance with medical treatment, presenting hazards to health   . Type II diabetes mellitus with ophthalmic manifestations, uncontrolled ([place])   . Hyperlipidemia   . OP (osteoporosis)   . Hypertension   . CKD (chronic kidney disease)     Patient Active Problem List   Diagnosis Date Noted  . Acute on chronic renal failure ([place]) 04/23/2016  . 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 Acute right flank pain   . ARF (acute renal failure) ([place]) 04/15/2016  . Acute cystitis 05/26/2015  . Neuropathy ([place]) 05/26/2015    Past Surgical History  Procedure  Laterality Date  . Peripheral vascular catheterization N/A 04/25/2016    Procedure: Dialysis/Perma Catheter Insertion;  Surgeon: Algernon Huxley, MD;  Location: [facility];  Service: Cardiovascular;  Laterality: N/A;    Past Surgical History  Procedure Laterality Date  . Peripheral vascular catheterization N/A 04/25/2016    Procedure: Dialysis/Perma Catheter Insertion;  Surgeon: Algernon Huxley, MD;  Location: [facility];  Service: Cardiovascular;  Laterality: N/A;    Current Outpatient Rx  Name  Route  Sig  Dispense  Refill  . acetaminophen (TYLENOL) 500 MG tablet   Oral   Take 1,000 mg by mouth every 6 (six) hours as needed for mild pain or headache.         Marland Kitchen amLODipine (NORVASC) 10 MG tablet   Oral   Take 1 tablet (10 mg total) by mouth daily.   30 tablet   0   . atorvastatin (LIPITOR) 40 MG tablet   Oral   Take 40 mg by mouth at bedtime.          . calcium acetate (PHOSLO) 667 MG capsule   Oral   Take 1 capsule (667 mg total) by mouth 3 (three) times daily with meals.   90 capsule   0   . hydrALAZINE (APRESOLINE) 25 MG tablet   Oral   Take 1 tablet (25 mg total) by mouth 3 (three) times daily.   90 tablet   0   . insulin NPH-regular Human (NOVOLIN 70/30) (70-30) 100 UNIT/ML injection   Subcutaneous   Inject 15 Units into the skin daily.          Marland Kitchen  losartan (COZAAR) 50 MG tablet   Oral   Take 1 tablet (50 mg total) by mouth daily.   30 tablet   0   . metoprolol tartrate (LOPRESSOR) 25 MG tablet   Oral   Take 1 tablet (25 mg total) by mouth 2 (two) times daily.   60 tablet   0   . Nutritional Supplements (FEEDING SUPPLEMENT, NEPRO CARB STEADY,) LIQD   Oral   Take 237 mLs by mouth 2 (two) times daily between meals.   60 Can   0     Allergies:  Review of patient's allergies indicates no known allergies.  Family History: Family History  Problem Relation Age of Onset  . Hypertension Mother   . Hyperlipidemia Mother     Social  History: Social History  Substance Use Topics  . Smoking status: Never Smoker   . Smokeless tobacco: None  . Alcohol Use: No     Review of Systems:   10 point review of systems was performed and was otherwise negative:  Constitutional: No fever Eyes: No visual disturbances ENT: No sore throat, ear pain Cardiac: No chest pain Respiratory: No shortness of breath, wheezing, or stridor Abdomen: No abdominal pain, no vomiting, No diarrhea Endocrine: No weight loss, No night sweats Extremities: No peripheral edema, cyanosis Skin: No rashes, easy bruising Neurologic: No focal weakness, trouble with speech or swollowing Urologic: No dysuria, Hematuria, or urinary frequency   Physical Exam:  ED Triage Vitals  Enc Vitals Group     BP 05/21/16 2034 132/70 mmHg     Pulse Rate 05/21/16 2034 72     Resp 05/21/16 2100 19     Temp --      Temp Source 05/21/16 2230 Oral     SpO2 05/21/16 2034 92 %     Weight 05/21/16 2034 110 lb (49.896 kg)     Height 05/21/16 2034 5' (1.524 m)     Head Cir --      Peak Flow --      Pain Score 05/21/16 2035 10     Pain Loc --      Pain Edu? --      Excl. in [place]? --     General: Awake , Alert , and Oriented times 3; GCS 15 Head: Normal cephalic , atraumatic Eyes: Pupils equal , round, reactive to light Nose/Throat: No nasal drainage, patent upper airway without erythema or exudate.  Neck: Supple, Full range of motion, No anterior adenopathy or palpable thyroid masses Lungs: Clear to ascultation without wheezes , rhonchi, or rales Heart: Regular rate, regular rhythm without murmurs , gallops , or rubs Abdomen: Soft, non tender without rebound, guarding , or rigidity; bowel sounds positive and symmetric in all 4 quadrants. No organomegaly .        Extremities: 2 plus symmetric pulses. No edema, clubbing or cyanosis Neurologic: normal ambulation, Motor symmetric without deficits, sensory intact Skin: warm, dry, no rashes   Labs:   All  laboratory work was reviewed including any pertinent negatives or positives listed below:  Labs Reviewed  CBC WITH DIFFERENTIAL/PLATELET - Abnormal; Notable for the following:    RBC 3.20 (*)    Hemoglobin 8.7 (*)    HCT 26.6 (*)    RDW 18.4 (*)    Lymphs Abs 0.9 (*)    All other components within normal limits  COMPREHENSIVE METABOLIC PANEL - Abnormal; Notable for the following:    Chloride 99 (*)    Glucose, Bld 141 (*)  Creatinine, Ser 1.98 (*)    Calcium 8.8 (*)    AST 46 (*)    Alkaline Phosphatase 268 (*)    GFR calc non Af Amer 26 (*)    GFR calc Af Amer 30 (*)    All other components within normal limits  GLUCOSE, CAPILLARY - Abnormal; Notable for the following:    Glucose-Capillary 138 (*)    All other components within normal limits  Patient's currently appears to be at baseline first words her laboratory work she is chronically anemic and actually has improved at a level of 8.7 on her hemoglobin.  EKG:  ED ECG REPORT I, Daymon Larsen, the attending physician, personally viewed and interpreted this ECG.  Date: 05/22/2016 EKG Time: 2047 Rate: 57 Rhythm: normal sinus rhythm QRS Axis: normal Intervals: normal ST/T Wave abnormalities: Prolonged QT interval Conduction Disturbances: none Narrative Interpretation: unremarkable    Radiology:   US Venous Img Lower Unilateral Right (In process)       CT Head Wo Contrast (Final result) Result time: 05/21/16 21:38:37   Final result by Rad Results In Interface (05/21/16 21:38:37)   Narrative:   CLINICAL DATA: Headache, low blood sugar, right leg pain.  EXAM: CT HEAD WITHOUT CONTRAST  TECHNIQUE: Contiguous axial images were obtained from the base of the skull through the vertex without intravenous contrast.  COMPARISON: Head CT dated 04/18/2016.  FINDINGS: Brain: Ventricles are normal in size and configuration. All areas of the brain demonstrate normal gray-white matter attenuation. There is no  mass, hemorrhage, edema or other evidence of acute parenchymal abnormality. No extra-axial hemorrhage.  Vascular: No hyperdense vessel or unexpected calcification.  Skull: Negative for fracture or focal lesion.  Sinuses/Orbits: No acute findings. Visualized upper paranasal sinuses are clear. Mastoid air cells are clear.  Other: None.  IMPRESSION: Negative head CT. No intracranial mass, hemorrhage or edema.   Electronically Signed By: Franki Cabot M.D. On: 05/21/2016 21:38          DG Chest 2 View (Final result) Result time: 05/21/16 [id number]   Final result by Rad Results In Interface (05/21/16 21:25:22)   Narrative:   CLINICAL DATA: Headache, weakness, diaphoresis and hypoglycemia beginning this afternoon.  EXAM: CHEST 2 VIEW  COMPARISON: Chest x-rays dated 04/29/2016 and 04/23/2016.  FINDINGS: Right-sided central catheter is stable in position with tip at the level the right atrium. Mild cardiomegaly is stable. Overall cardiomediastinal silhouette is stable in size and configuration.  Mild central pulmonary vascular congestion and mild bilateral interstitial edema persists. Probable mild atelectasis at each lung base. Probable small bilateral pleural effusions. No large pleural effusion. No pneumothorax seen. Osseous structures about the chest are unremarkable.  IMPRESSION: 1. Stable mild cardiomegaly with central pulmonary vascular congestion and interstitial edema suggesting mild volume overload/CHF. This appearance is not significantly changed compared to the previous chest x-ray of 04/29/2016. 2. Probable mild atelectasis and/or small pleural effusions at each lung base. This also appears stable. 3. No new lung findings. No large pleural effusion. No evidence of pneumonia.   Electronically Signed      I personally reviewed the radiologic studies    ED Course: Patient has a pending ultrasound of her right lower extremity but  otherwise a headache does not show any findings with his father as blood or mass effect or any acute concerns per CAT scan evaluation clinically she doesn't seem to have any focal neurologic deficits. I felt lumbar puncture was not necessary at this time and do not have a  strong clinical suspicion for cerebral aneurysmal disease. Patient's laboratory work appears to be at her baseline as does her chest x-ray. She had serial blood sugars obtained and is currently at 128 after a meal here in emergency department.*  Plan is if ultrasound is negative patient should be discharged from emergency department. Family seems comfortable with this plan.  Assessment: * Acute unspecified cephalgia Hypoglycemia Unspecified right lower extremity pain  F   Plan:  Outpatient management Patient was advised to return immediately if condition worsens. Patient was advised to follow up with their primary care physician or other specialized physicians involved in their outpatient care. The patient and/or family member/power of attorney had laboratory results reviewed at the bedside. All questions and concerns were addressed and appropriate discharge instructions were distributed by the nursing staff.           Daymon Larsen, MD 05/22/16 [phone]

## 2016-05-22 NOTE — Discharge Instructions (Signed)
Control del nivel de glucosa en la sangre - Adultos (Blood Glucose Monitoring, Adult) El control de la glucosa en la sangre (tambin llamada azcar en la sangre) lo ayudar a tener la diabetes bajo control. Tambin ayuda a que usted y Chief Financial Officer la diabetes y determinen si el tratamiento es Armed forces logistics/support/administrative officer. POR QU HAY QUE CONTROLAR LA GLUCOSA EN LA SANGRE?  Esto puede ayudar a comprender de [employer], la actividad fsica y los medicamentos inciden en los niveles de [place].  Le permite conocer el nivel de glucosa en la sangre en cualquier momento dado. Puede saber rpidamente si el nivel es bajo (hipoglucemia) o alto (hiperglucemia).  Puede ser de ayuda para que usted y el mdico sepan cmo Water engineer,  y para entender cmo controlar una enfermedad o ajustar los medicamentos para hacer ejercicio. CUNDO DEBE HACERSE LAS PRUEBAS? El mdico lo ayudar a decidir con qu frecuencia deber [employer] niveles de glucosa en la [place]. Esto puede depender del tipo de diabetes que tenga, su control de la diabetes o los tipos de medicamentos que tome. Asegrese de anotar todos los valores de la glucosa en la [place], de modo que esta informacin pueda ser revisada por su mdico. A continuacin puede ver ejemplos de los momentos para Optometrist la prueba que el mdico puede Event organiser. Diabetes tipo1  Mdaselo al menos 2 veces al da si la diabetes est bien controlada, si [place] una bomba de insulina o si se aplica muchas inyecciones diarias.  Si la diabetes no est bien controlada o si est enfermo, puede ser necesario que se controle con ms frecuencia.  Es recomendable que tambin lo mida en estas oportunidades:  Antes de cada inyeccin de insulina.  Antes y despus de hacer ejercicio.  Minco comidas y 2horas despus de Scientist, research (physical sciences).  Ocasionalmente, entre las 2:00a.m. y las 3:00a.m. Diabetes tipo2  Si est utilizando insulina, realice la medicin al menos 2  veces al [employer]. Sin embargo, es Multimedia programmer medicin antes de cada inyeccin de [place].  Si toma medicamentos por boca (va oral), hgase la prueba 2veces por da.  Si sigue una dieta controlada, hgase la prueba una vez por da.  Si la diabetes no est bien controlada o si est enfermo, puede ser necesario que se controle con ms frecuencia. CMO CONTROLAR EL NIVEL DE GLUCOSA EN LA SANGRE Insumos necesarios  Medidor de glucosa en la sangre.  Tiras reactivas para el medidor. Cada medidor tiene sus propias tiras reactivas. Ashley tiras reactivas correspondientes a su medidor.  Una aguja para pinchar (lanceta).  Un dispositivo que sujeta la lanceta (dispositivo de puncin).  Un diario o libro de anotaciones para [employer]. Procedimiento  Lave sus manos con agua y [place]. No se recomienda usar alcohol.  Pnchese el costado del dedo (no la punta) con Retail buyer.  Apriete suavemente el dedo hasta que aparezca una pequea gota de [place].  Siga las instrucciones que vienen con el medidor para Garment/textile technologist tira Comptroller, Midwife la sangre sobre la tira y usar el medidor de Printmaker. Otras zonas de las que se puede tomar sangre para la prueba Algunos medidores le permiten tomar sangre para la prueba de otras zonas del cuerpo (que no son el dedo). Estas reas se llaman sitios alternativos. Los sitios alternativos ms comunes son los siguientes:  El Management consultant.  El muslo.  La zona posterior de la parte inferior de la pierna.  La palma de la  mano. El flujo de sangre en estas zonas es ms lento. Por lo tanto, los valores de glucosa en la sangre que obtenga pueden estar demorados, y los nmeros son diferentes de los que obtiene de los dedos. No saque sangre de sitios alternativos si cree que tiene hipoglucemia. Los valores no sern precisos. Siempre extraiga del dedo si tiene hipoglucemia. Adems, si no puede darse cuenta cuando tiene bajos los niveles  (hipoglucemia asintomtica), siempre extraiga sangre de los dedos para los controles de glucosa en la [place]. CONSEJOS ADICIONALES PARA EL CONTROL DE LA GLUCOSA  No vuelva a [place] lancetas.  Siempre tenga los insumos a mano.  Todos los medidores de glucosa incluyen un nmero de telfono "directo", disponible las 24 horas, al que podr llamar si tiene preguntas o [place].  Ajuste (calibre) el medidor de glucosa con una solucin de control despus de terminar algunas cajas de tiras reactivas. LLEVE REGISTROS DE LOS NIVELES DE GLUCOSA EN LA SANGRE Es recomendable llevar un diario o un registro de los valores de glucosa en la [place]. La [employer] de los medidores de glucosa, sino todos, conservan el registro de la glucosa en el dispositivo. Algunos medidores permiten descargar los registros a su computadora. Llevar un registro de los valores de glucosa en la sangre es especialmente til si desea observar los patrones. Haga anotaciones simultneas con la Teacher, English as a foreign language de los valores de glucosa en la sangre debido a que podra olvidar lo que ocurri en el momento exacto. Llevar un buen registro los ayudar a usted y al mdico a Fish farm manager juntos para Scientist, forensic un buen control de la diabetes.    Esta informacin no tiene Marine scientist el consejo del mdico. Asegrese de hacerle al mdico cualquier pregunta que tenga.   Document Released: 11/25/2005 Document Revised: 12/16/2014 Elsevier Interactive Patient Education 2016 [place] (Hypoglycemia) La hipoglucemia se produce cuando el nivel de glucosa en la sangre es demasiado bajo. La glucosa es un tipo de azcar, que es la principal fuente de energa del cuerpo. Hormonas, como la insulina y Secretary/administrator, Probation officer el nivel de glucosa en la [place]. La insulina reduce el nivel de la glucosa en la sangre, mientras que el glucagn lo [place]. Si tiene demasiada insulina en el torrente sanguneo o si no ingiere suficientes alimentos  que contengan azcar, puede desarrollar hipoglucemia. Esta afeccin puede manifestarse en personas con o sin diabetes. Puede desarrollarse rpidamente y, como consecuencia, necesitar atencin urgente.  CAUSAS   Omitir o retrasar comidas.  No ingerir demasiados carbohidratos en las comidas.  Consumo excesivo de medicamentos para la diabetes.  No coordinar el horario de la toma de medicamentos por va oral para la diabetes o de insulina, con las comidas, las colaciones y Adult nurse.  Nuseas y vmitos.  Algunos medicamentos.  Enfermedades graves, como hepatitis, trastornos renales y ciertos trastornos de Youth worker.  Aumento de la actividad fsica o el ejercicio, sin ingerir alimentos adicionales o ajustar los medicamentos.  Beber alcohol en exceso.  Un trastorno nervioso que afecta las funciones corporales, como la frecuencia cardaca, presin arterial y digestin (neuropata [place]).  Una afeccin en la cual los msculos del estmago no funcionan apropiadamente (gastroparesia). Por consiguiente, los medicamentos y los alimentos no pueden absorberse Personal assistant.  Pocas veces un tumor de pncreas puede producir demasiada insulina. SNTOMAS   Hambre.  Sudoraciones (diaforesis).  Cambio en la Firefighter.  Temblores.  Dolor de [place].  Ansiedad.  Aturdimiento.  Irritabilidad.  Dificultad para concentrarse.  [employer]  en la boca.  Hormigueo o adormecimiento de las manos y los pies.  Sueo agitado o alteraciones del sueo.  Alteracin en el habla y la coordinacin.  Cambio en el estado mental.  Convulsiones breves o prolongadas.  Agresividad  Somnolencia (letargo).  Debilidad.  Aumento de la frecuencia cardaca o palpitaciones.  Confusin.  Piel plida o de [employer].  Visin borrosa o doble.  Desmayos. DIAGNSTICO  Le harn un examen fsico y [place] historia clnica. Su mdico puede hacer un diagnstico en funcin de sus sntomas.  Pueden realizarle anlisis de sangre y otras pruebas de laboratorio para Physicist, medical diagnstico. Una vez realizado el diagnstico, su mdico observar si los signos y sntomas desaparecen, una vez que aumenta el nivel de la glucosa en la [place].  TRATAMIENTO  Por lo general, la hipoglucemia puede tratarse fcilmente cuando se observan sntomas.  Controle su nivel de glucosa en la sangre. Si es menor que 70 mg/dl, tome uno de los siguientes:  3 o 4 comprimidos de glucosa.   taza de jugo.   taza de una gaseosa comn.  Los [place]eral[place]   a 1 pomo de glucosa en gel.  5 a 6 caramelos duros.  Evite las bebidas o los alimentos con alto contenido de grasa, que pueden retrasar el aumento de los niveles de glucosa en la [place].  No ingiera ms de la cantidad recomendada de alimentos, bebidas, gel o comprimidos que contengan azcar. Si lo hace, el nivel de glucosa en la sangre subir demasiado.  Espere de 10 a 15 minutos y vuelva a Chief Technology Officer su nivel de glucosa en la sangre. Si an es Scientist, product/process development 70 mg/dl o est por debajo del intervalo indicado, repita el tratamiento.  Ingiera una colacin si falta ms de 1 hora para su prxima comida. Es posible que, alguna vez, su nivel de glucosa en la sangre baje [place], de modo que no pueda tratarse en su casa, cuando comience a observar los sntomas. Probablemente necesite ayuda. Incluso puede desmayarse o ser incapaz de tragar. Si no puede tratarse por s solo, alguien Software engineer al hospital.  INSTRUCCIONES PARA EL CUIDADO EN EL HOGAR  Si tiene diabetes, siga su plan de control de la diabetes:  Tome los medicamentos segn las indicaciones.  Siga el plan de ejercicio.  Siga el plan de comidas. No saltee comidas. Coma a horario.  Controle su nivel de glucosa en la sangre peridicamente. Controle su nivel de glucosa en la sangre antes y despus de ejercitarse. Si hace ejercicio durante ms tiempo o de [employer] de lo  habitual, asegrese de Chief Technology Officer su nivel de glucosa en la sangre con mayor frecuencia.  Use su pulsera o medalla de alerta mdica, que indica que usted tiene diabetes.  Identifique la causa de su hipoglucemia. Luego, desarrolle formas de prevenir la recurrencia de la hipoglucemia.  No tome un bao o una ducha caliente inmediatamente despus de una inyeccin de insulina.  Siempre lleve [facility]. Las pastillas de glucosa son fciles de Catering manager.  Si va a beber alcohol, bbalo solo con las comidas.  Informe a familiares y amigos qu pueden hacer para mantenerlo seguro durante una convulsin. Esto puede incluir retirar [employer] duros o filosos del rea o colocarlo de costado.  Mantenga un peso saludable. SOLICITE ATENCIN MDICA SI:   Tiene problemas para Advertising account executive de glucosa en la sangre dentro del intervalo indicado.  Tiene episodios frecuentes de hipoglucemia.  Siente efectos secundarios por los medicamentos prescritos.  No est seguro por qu su nivel de glucosa en la sangre es tan bajo.  Nota cambios o un nuevo problema en la visin . SOLICITE ATENCIN MDICA DE INMEDIATO SI:   Presenta confusin.  Se produce un cambio en su estado mental.  Es incapaz de tragar.  Se desmaya.   Esta informacin no tiene Marine scientist el consejo del mdico. Asegrese de hacerle al mdico cualquier pregunta que tenga.   Document Released: 11/25/2005 Document Revised: 11/30/2013 Elsevier Interactive Patient Education 2016 [place] general sin causa (General Headache Without Cause) El dolor de cabeza es un dolor o Tree surgeon que se siente en la zona de la cabeza o del cuello. Puede no tener una causa especfica. Hay muchas causas y tipos de dolores de [place]. Los dolores de cabeza ms comunes son los siguientes:  Cefalea tensional.  Cefaleas migraosas.  Cefalea en brotes.  Cefaleas diarias crnicas. INSTRUCCIONES PARA EL CUIDADO EN EL  HOGAR  Controle su afeccin para ver si hay cambios. Siga estos pasos para Aeronautical engineer afeccin: Control del [employer] medicamentos de venta libre y los recetados solamente como se lo haya indicado el mdico.  Cuando sienta dolor de cabeza acustese en un cuarto oscuro y tranquilo.  Si se lo indican, aplique hielo sobre la cabeza y la zona del cuello:  Ponga el hielo en una bolsa plstica.  Coloque una toalla entre la piel y la bolsa de hielo.  Coloque el hielo durante 3minutos, 2 a 3veces por Training and development officer.  Utilice una almohadilla trmica o tome una ducha con agua caliente para aplicar calor en la cabeza y la zona del cuello como se lo haya indicado el Springer luces tenues si le [employer] luces brillantes o sus dolores de cabeza empeoran. Comida y bebida  Mantenga un horario para las comidas.  Limite el consumo de bebidas alcohlicas.  Consuma menos cantidad de cafena o deje de tomarla. Instrucciones generales  Concurra a todas las visitas de control como se lo haya indicado el mdico. Esto es importante.  Lleve un diario de los dolores de cabeza para Neurosurgeon qu factores pueden desencadenarlos. Por ejemplo, escriba los siguientes datos:  Lo que usted come y Buyer, retail.  Cunto tiempo duerme.  Algn cambio en su dieta o en los medicamentos.  Pruebe algunas tcnicas de relajacin, como los [place].  Limite el estrs.  Sintese con la espalda recta y no tense los msculos.  No consuma productos que contengan tabaco, incluidos cigarrillos, tabaco de Higher education careers adviser o cigarrillos electrnicos. Si necesita ayuda para dejar de fumar, consulte al mdico.  Haga actividad fsica habitualmente como se lo haya indicado el mdico.  Tenga un horario fijo para dormir. Duerma entre 7 y 9horas o la cantidad de horas que le haya recomendado el mdico. SOLICITE ATENCIN MDICA SI:   Los medicamentos no Dealer los sntomas.  Tiene un dolor de cabeza que es diferente  del dolor de cabeza habitual.  Tiene nuseas o vmitos.  Tiene fiebre. SOLICITE ATENCIN MDICA DE INMEDIATO SI:   El dolor se hace cada vez ms intenso.  Ha vomitado repetidas veces.  Presenta rigidez en el cuello.  Sufre prdida de la visin.  Tiene problemas para hablar.  Siente dolor en el ojo o en el odo.  Presenta debilidad muscular o prdida del control muscular.  Pierde el equilibrio o tiene problemas para Writer.  Sufre mareos o se desmaya.  Se siente confundido.   Esta informacin  no tiene Marine scientist el consejo del mdico. Asegrese de hacerle al mdico cualquier pregunta que tenga.   Document Released: 09/04/2005 Document Revised: 08/16/2015 Elsevier Interactive Patient Education [employer].

## 2016-05-22 NOTE — ED Provider Notes (Signed)
-----------------------------------------   1:11 AM on 05/22/2016 -----------------------------------------  Repeat blood sugar 138. Doppler ultrasound right lower leg negative for DVT. Will proceed with discharge as anticipated. Strict return precautions given. Patient family verbalized understanding and agree with plan of care.  Paulette Blanch, MD 05/22/16 [phone]

## 2016-06-01 DIAGNOSIS — E8809 Other disorders of plasma-protein metabolism, not elsewhere classified: Secondary | ICD-10-CM | POA: Insufficient documentation

## 2016-06-05 DIAGNOSIS — Z23 Encounter for immunization: Secondary | ICD-10-CM | POA: Insufficient documentation

## 2016-09-06 ENCOUNTER — Encounter (INDEPENDENT_AMBULATORY_CARE_PROVIDER_SITE_OTHER): Payer: Self-pay

## 2016-09-09 ENCOUNTER — Other Ambulatory Visit: Payer: Self-pay | Admitting: Vascular Surgery

## 2016-09-09 ENCOUNTER — Encounter
Admission: RE | Admit: 2016-09-09 | Discharge: 2016-09-09 | Disposition: A | Discharge status: Home / Self Care | Payer: Self-pay | Source: Ambulatory Visit | Attending: Vascular Surgery | Admitting: Vascular Surgery

## 2016-09-09 DIAGNOSIS — Z01812 Encounter for preprocedural laboratory examination: Secondary | ICD-10-CM | POA: Insufficient documentation

## 2016-09-09 DIAGNOSIS — N186 End stage renal disease: Secondary | ICD-10-CM | POA: Insufficient documentation

## 2016-09-09 HISTORY — DX: Dependence on renal dialysis: Z99.2

## 2016-09-09 HISTORY — DX: Cerebral infarction, unspecified: I63.9

## 2016-09-09 LAB — SURGICAL PCR SCREEN
MRSA, PCR: NEGATIVE
STAPHYLOCOCCUS AUREUS: POSITIVE — AB

## 2016-09-09 LAB — TYPE AND SCREEN
ABO/RH(D): O POS
Antibody Screen: NEGATIVE

## 2016-09-09 LAB — CBC WITH DIFFERENTIAL/PLATELET
BASOS ABS: 0.1 10*3/uL (ref 0–0.1)
Basophils Relative: 1 %
EOS PCT: 2 %
Eosinophils Absolute: 0.2 10*3/uL (ref 0–0.7)
HEMATOCRIT: 38.8 % (ref 35.0–47.0)
Hemoglobin: 12.7 g/dL (ref 12.0–16.0)
LYMPHS ABS: 3.3 10*3/uL (ref 1.0–3.6)
LYMPHS PCT: 35 %
MCH: 29.1 pg (ref 26.0–34.0)
MCHC: 32.9 g/dL (ref 32.0–36.0)
MCV: 88.4 fL (ref 80.0–100.0)
MONO ABS: 0.6 10*3/uL (ref 0.2–0.9)
MONOS PCT: 7 %
NEUTROS ABS: 5.2 10*3/uL (ref 1.4–6.5)
Neutrophils Relative %: 55 %
PLATELETS: 201 10*3/uL (ref 150–440)
RBC: 4.39 MIL/uL (ref 3.80–5.20)
RDW: 19.2 % — AB (ref 11.5–14.5)
WBC: 9.4 10*3/uL (ref 3.6–11.0)

## 2016-09-09 LAB — BASIC METABOLIC PANEL
ANION GAP: 10 (ref 5–15)
BUN: 54 mg/dL — AB (ref 6–20)
CHLORIDE: 106 mmol/L (ref 101–111)
CO2: 25 mmol/L (ref 22–32)
Calcium: 8.8 mg/dL — ABNORMAL LOW (ref 8.9–10.3)
Creatinine, Ser: 4.65 mg/dL — ABNORMAL HIGH (ref 0.44–1.00)
GFR calc Af Amer: 11 mL/min — ABNORMAL LOW (ref >60)
GFR, EST NON AFRICAN AMERICAN: 9 mL/min — AB (ref >60)
GLUCOSE: 29 mg/dL — AB (ref 65–99)
POTASSIUM: 5.9 mmol/L — AB (ref 3.5–5.1)
Sodium: 141 mmol/L (ref 135–145)

## 2016-09-09 LAB — PROTIME-INR
INR: 0.93
Prothrombin Time: 12.5 seconds (ref 11.4–15.2)

## 2016-09-09 LAB — APTT: APTT: 37 s — AB (ref 24–36)

## 2016-09-09 NOTE — Patient Instructions (Signed)
Your procedure is scheduled on: September 18, 2016 (Wednesday) Su procedimiento est programado para: Report to Day Surgery. Medical Mall, Second Floor Presntese a: To find out your arrival time please call [phone] between 1PM - 3PM on September 17, 2016.(Tuesday) Para saber su hora de llegada por favor llame al [phone] entre la 1PM - 3PM el da:  Remember: Instructions that are not followed completely may result in serious medical risk, up to and including death, or upon the discretion of your surgeon and anesthesiologist your surgery may need to be rescheduled.  Recuerde: Las instrucciones que no se siguen completamente Heritage manager en un riesgo de salud grave, incluyendo hasta la [place] o a discrecin de su cirujano y Environmental health practitioner, su ciruga se puede posponer.   x___ 1. Do not eat food or drink liquids after midnight. No gum chewing or hard candies.  No coma alimentos ni tome lquidos despus de la medianoche.  No mastique chicle ni caramelos  duros.     _x___ 2. No alcohol for 24 hours before or after surgery.    No tome alcohol durante las 24 horas antes ni despus de la [place].   _x___ 3. Bring all medications with you on the day of surgery if instructed.    Lleve todos los medicamentos con usted el da de su ciruga si se le ha indicado as.   _x__ 4. Notify your doctor if there is any change in your medical condition (cold, fever,                             infections).    Informe a su mdico si hay algn cambio en su condicin mdica (resfriado, fiebre, infecciones).   Do not wear jewelry, make-up, hairpins, clips or nail polish.  No use joyas, maquillajes, pinzas/ganchos para el cabello ni esmalte de uas.  Do not wear lotions, powders, or perfumes. You may wear deodorant.  No use lociones, polvos o perfumes.  Puede usar desodorante.    Do not shave 48 hours prior to surgery. Men may shave face and neck.  No se afeite 48 horas antes de la [place].  Los hombres  pueden [employer] cara y el cuello.   Do not bring valuables to the hospital.   No lleve objetos [place] is not responsible for any belongings or valuables.  [place] no se hace responsable de ningn tipo de pertenencias u objetos de Geographical information systems officer.               Contacts, dentures or bridgework may not be worn into surgery.  Los lentes de [place], las dentaduras postizas o puentes no se pueden usar en la [place].  Leave your suitcase in the car. After surgery it may be brought to your room.  Deje su maleta en el auto.  Despus de la ciruga podr traerla a su habitacin.  For patients admitted to the hospital, discharge time is determined by your treatment team.  Para los pacientes que sean ingresados al hospital, el tiempo en el cual se le dar de alta es determinado por su                equipo de [place].   Patients discharged the day of surgery will not be allowed to drive home. A los pacientes que se les da de alta el mismo da de la ciruga no se les permitir conducir a [place] representative.   Please  read over the following fact sheets that you were given: Por favor [place] informacin que le dieron:   MRSA Information   _x_ Take these medicines the morning of surgery with A SIP OF WATER:          [employer] medicinas la maana de la ciruga con UN SORBO DE AGUA:  1. Bring all medications to hospital the day of surgery, if daughter does not call back to Pre-Admit Testing with medications you are taking   [phone]  ____ Fleet Enema (as directed)          Enema de Fleet (segn lo indicado)    __x__ Use CHG Soap as directed (Sage Wipes)          Utilice el jabn de CHG segn lo indicado  ____ Use inhalers on the day of surgery          Use los inhaladores el da de la ciruga  ____ Stop metformin 2 days prior to surgery          Deje de tomar el metformin 2 das antes de la ciruga    _x___ Take 1/2 of usual insulin dose the night  before surgery and none on the morning of surgery (No Insulin the morning of surgery)          Tome la mitad de la dosis habitual de insulina la noche antes de la [place] y no tome nada en la maana de la             ciruga  _x___ Stop Coumadin/Plavix/aspirin on (NO ASPIRIN)          Deje de tomar el Coumadin/Plavix/aspirina el da:  ____ Stop Anti-inflammatories on (NO ASPIRIN PRODUCTS, ALEVE, ADVIL, MOTRIN, IBUPROFEN , GOODY'S )          Deje de tomar antiinflamatorios el da:   ____ Stop supplements until after surgery            Deje de tomar suplementos hasta despus de la ciruga  ____ Bring C-Pap to the hospital          [place] al hospital

## 2016-09-10 NOTE — Pre-Procedure Instructions (Signed)
Met B results received this morning and blood glucose 29 on September 09, 2016. Patient was called and spoke to daughter, Verdis Frederickson, and stated " mother is okay, she is at dialysis." I

## 2016-09-10 NOTE — Pre-Procedure Instructions (Signed)
Called patient family twice to verify medications , and daughter not available each time.

## 2016-09-10 NOTE — Pre-Procedure Instructions (Signed)
KT 5.9 09/09/16. CALLED TO DR Rosey Bath. PATIENT AT DIALYSIS TODAY. RECHECK KT AM SURGERY

## 2016-09-13 NOTE — Pre-Procedure Instructions (Signed)
Called patient daughter to review medications to take the day of surgery, and was able to go over the meds. Instructed daughter to give patient Metoprolol, Losartan, and HydrAlazine the day of surgery with a sip of water, and NO INSULIN the morning of surgery.Daughter, [name], verbalized understanding of instructions.

## 2016-09-18 ENCOUNTER — Ambulatory Visit: Payer: Self-pay | Admitting: Anesthesiology

## 2016-09-18 ENCOUNTER — Ambulatory Visit
Admission: RE | Admit: 2016-09-18 | Discharge: 2016-09-18 | Disposition: A | Discharge status: Home / Self Care | Payer: Self-pay | Source: Ambulatory Visit | Attending: Vascular Surgery | Admitting: Vascular Surgery

## 2016-09-18 ENCOUNTER — Encounter: Payer: Self-pay | Admitting: *Deleted

## 2016-09-18 ENCOUNTER — Encounter
Admission: RE | Disposition: A | Discharge status: Home / Self Care | Payer: Self-pay | Source: Ambulatory Visit | Attending: Vascular Surgery

## 2016-09-18 DIAGNOSIS — Z538 Procedure and treatment not carried out for other reasons: Secondary | ICD-10-CM | POA: Insufficient documentation

## 2016-09-18 DIAGNOSIS — N186 End stage renal disease: Secondary | ICD-10-CM | POA: Insufficient documentation

## 2016-09-18 LAB — POCT I-STAT 4, (NA,K, GLUC, HGB,HCT)
GLUCOSE: 118 mg/dL — AB (ref 65–99)
GLUCOSE: 120 mg/dL — AB (ref 65–99)
HCT: 36 % (ref 36.0–46.0)
HEMATOCRIT: 36 % (ref 36.0–46.0)
HEMOGLOBIN: 12.2 g/dL (ref 12.0–15.0)
Hemoglobin: 12.2 g/dL (ref 12.0–15.0)
POTASSIUM: 6.1 mmol/L — AB (ref 3.5–5.1)
Potassium: 6.2 mmol/L — ABNORMAL HIGH (ref 3.5–5.1)
SODIUM: 139 mmol/L (ref 135–145)
Sodium: 138 mmol/L (ref 135–145)

## 2016-09-18 LAB — POTASSIUM: Potassium: 6.5 mmol/L (ref 3.5–5.1)

## 2016-09-18 LAB — ABO/RH: ABO/RH(D): O POS

## 2016-09-18 LAB — GLUCOSE, CAPILLARY: GLUCOSE-CAPILLARY: 107 mg/dL — AB (ref 65–99)

## 2016-09-18 SURGERY — ARTERIOVENOUS (AV) FISTULA CREATION
Anesthesia: General | Laterality: Right

## 2016-09-18 MED ORDER — CEFAZOLIN SODIUM-DEXTROSE 2-4 GM/100ML-% IV SOLN: 2.0000 g | INTRAVENOUS | Status: DC

## 2016-09-18 MED ORDER — CEFAZOLIN SODIUM-DEXTROSE 2-4 GM/100ML-% IV SOLN
INTRAVENOUS | Status: AC
  Filled 2016-09-18: qty 100

## 2016-09-18 MED ORDER — BUPIVACAINE-EPINEPHRINE (PF) 0.5% -1:200000 IJ SOLN
INTRAMUSCULAR | Status: AC
  Filled 2016-09-18: qty 30

## 2016-09-18 MED ORDER — CHLORHEXIDINE GLUCONATE CLOTH 2 % EX PADS: 6.0000 | MEDICATED_PAD | Freq: Once | CUTANEOUS | Status: DC

## 2016-09-18 MED ORDER — PAPAVERINE HCL 30 MG/ML IJ SOLN
INTRAMUSCULAR | Status: AC
  Filled 2016-09-18: qty 2

## 2016-09-18 MED ORDER — SODIUM CHLORIDE 0.9 % IV SOLN
INTRAVENOUS | Status: DC
  Administered 2016-09-18: 11:00:00 via INTRAVENOUS

## 2016-09-18 MED ORDER — HEPARIN SODIUM (PORCINE) 5000 UNIT/ML IJ SOLN
INTRAMUSCULAR | Status: AC
  Filled 2016-09-18: qty 1

## 2016-09-18 SURGICAL SUPPLY — 49 items
BAG DECANTER FOR FLEXI CONT (MISCELLANEOUS) ×3 IMPLANT
BLADE SURG SZ11 CARB STEEL (BLADE) ×3 IMPLANT
BOOT SUTURE AID YELLOW STND (SUTURE) ×3 IMPLANT
BRUSH SCRUB 4% CHG (MISCELLANEOUS) ×3 IMPLANT
CANISTER SUCT 1200ML W/VALVE (MISCELLANEOUS) ×3 IMPLANT
CHLORAPREP W/TINT 26ML (MISCELLANEOUS) ×3 IMPLANT
CLIP SPRNG 6MM S-JAW DBL (CLIP) ×3
ELECT CAUTERY BLADE 6.4 (BLADE) ×3 IMPLANT
ELECT REM PT RETURN 9FT ADLT (ELECTROSURGICAL) ×3
ELECTRODE REM PT RTRN 9FT ADLT (ELECTROSURGICAL) ×1 IMPLANT
GEL ULTRASOUND 20GR AQUASONIC (MISCELLANEOUS) IMPLANT
GLOVE BIO SURGEON STRL SZ7 (GLOVE) ×6 IMPLANT
GLOVE INDICATOR 7.5 STRL GRN (GLOVE) ×3 IMPLANT
GOWN STRL REUS W/ TWL LRG LVL3 (GOWN DISPOSABLE) ×2 IMPLANT
GOWN STRL REUS W/ TWL XL LVL3 (GOWN DISPOSABLE) ×1 IMPLANT
GOWN STRL REUS W/TWL LRG LVL3 (GOWN DISPOSABLE) ×4
GOWN STRL REUS W/TWL XL LVL3 (GOWN DISPOSABLE) ×2
HEMOSTAT SURGICEL 2X3 (HEMOSTASIS) ×3 IMPLANT
IV NS 500ML (IV SOLUTION) ×2
IV NS 500ML BAXH (IV SOLUTION) ×1 IMPLANT
KIT RM TURNOVER STRD PROC AR (KITS) ×3 IMPLANT
LABEL OR SOLS (LABEL) ×3 IMPLANT
LIQUID BAND (GAUZE/BANDAGES/DRESSINGS) ×3 IMPLANT
LOOP RED MAXI  1X406MM (MISCELLANEOUS) ×2
LOOP VESSEL MAXI 1X406 RED (MISCELLANEOUS) ×1 IMPLANT
LOOP VESSEL MINI 0.8X406 BLUE (MISCELLANEOUS) ×1 IMPLANT
LOOPS BLUE MINI 0.8X406MM (MISCELLANEOUS) ×2
NEEDLE FILTER BLUNT 18X 1/2SAF (NEEDLE) ×2
NEEDLE FILTER BLUNT 18X1 1/2 (NEEDLE) ×1 IMPLANT
NEEDLE HYPO 30X.5 LL (NEEDLE) IMPLANT
NS IRRIG 500ML POUR BTL (IV SOLUTION) ×3 IMPLANT
PACK EXTREMITY ARMC (MISCELLANEOUS) ×3 IMPLANT
PAD PREP 24X41 OB/GYN DISP (PERSONAL CARE ITEMS) ×3 IMPLANT
SOLUTION CELL SAVER (CLIP) ×1 IMPLANT
STOCKINETTE STRL 4IN 9604848 (GAUZE/BANDAGES/DRESSINGS) ×3 IMPLANT
SUT MNCRL AB 4-0 PS2 18 (SUTURE) ×3 IMPLANT
SUT PROLENE 6 0 BV (SUTURE) ×12 IMPLANT
SUT SILK 2 0 (SUTURE) ×2
SUT SILK 2-0 18XBRD TIE 12 (SUTURE) ×1 IMPLANT
SUT SILK 3 0 (SUTURE) ×2
SUT SILK 3-0 18XBRD TIE 12 (SUTURE) ×1 IMPLANT
SUT SILK 4 0 (SUTURE) ×2
SUT SILK 4-0 18XBRD TIE 12 (SUTURE) ×1 IMPLANT
SUT VIC AB 3-0 SH 27 (SUTURE) ×4
SUT VIC AB 3-0 SH 27X BRD (SUTURE) ×2 IMPLANT
SYR 20CC LL (SYRINGE) ×3 IMPLANT
SYR 3ML LL SCALE MARK (SYRINGE) ×3 IMPLANT
SYR TB 1ML 27GX1/2 LL (SYRINGE) IMPLANT
TOWEL OR 17X26 4PK STRL BLUE (TOWEL DISPOSABLE) IMPLANT

## 2016-09-18 NOTE — H&P (Signed)
 [facility] History & Physical Update  The patient was interviewed and re-examined.  The patient's previous History and Physical has been reviewed and is unchanged.  There is no change in the plan of care. We plan to proceed with the scheduled procedure.  [name], MD  09/18/2016, 12:01 PM

## 2016-09-18 NOTE — Progress Notes (Signed)
Pt potassium per lab 6.5, surgery canceled. Dr Lucky Cowboy explained to pt with spanish interpreter Loita.

## 2016-10-01 ENCOUNTER — Encounter
Admission: RE | Admit: 2016-10-01 | Discharge: 2016-10-01 | Disposition: A | Discharge status: Home / Self Care | Payer: Self-pay | Source: Ambulatory Visit | Attending: Vascular Surgery | Admitting: Vascular Surgery

## 2016-10-01 DIAGNOSIS — E1122 Type 2 diabetes mellitus with diabetic chronic kidney disease: Secondary | ICD-10-CM | POA: Insufficient documentation

## 2016-10-01 DIAGNOSIS — I120 Hypertensive chronic kidney disease with stage 5 chronic kidney disease or end stage renal disease: Secondary | ICD-10-CM | POA: Insufficient documentation

## 2016-10-01 DIAGNOSIS — N186 End stage renal disease: Secondary | ICD-10-CM | POA: Insufficient documentation

## 2016-10-01 DIAGNOSIS — Z01818 Encounter for other preprocedural examination: Secondary | ICD-10-CM | POA: Insufficient documentation

## 2016-10-01 LAB — TYPE AND SCREEN
ABO/RH(D): O POS
ANTIBODY SCREEN: NEGATIVE

## 2016-10-01 NOTE — Patient Instructions (Signed)
Your procedure is scheduled on: 10/10/16 Su procedimiento est programado para: Report to Day Surgery. 2nd floor medical mall entrance Presntese a: To find out your arrival time please call [phone] between 1PM - 3PM on 10/09/16. Para saber su hora de llegada por favor llame al [phone] entre la 1PM - 3PM el da:  Remember: Instructions that are not followed completely may result in serious medical risk, up to and including death, or upon the discretion of your surgeon and anesthesiologist your surgery may need to be rescheduled.  Recuerde: Las instrucciones que no se siguen completamente Heritage manager en un riesgo de salud grave, incluyendo hasta la [place] o a discrecin de su cirujano y Environmental health practitioner, su ciruga se puede posponer.   __x_ 1. Do not eat food or drink liquids after midnight. No gum chewing or hard candies.  No coma alimentos ni tome lquidos despus de la medianoche.  No mastique chicle ni caramelos  duros.     __x__ 2. No alcohol/no smoking for 24 hours before or after surgery.    No tome alcohol durante las 24 horas antes ni despus de la [place].   __x__ 3. Bring all medications with you on the day of surgery if instructed.    Lleve todos los medicamentos con usted el da de su ciruga si se le ha indicado as.   __x__ 4. Notify your doctor if there is any change in your medical condition (cold, fever,                             infections).    Informe a su mdico si hay algn cambio en su condicin mdica (resfriado, fiebre, infecciones).   Do not wear jewelry, make-up, hairpins, clips or nail polish.  No use joyas, maquillajes, pinzas/ganchos para el cabello ni esmalte de uas.  Do not wear lotions, powders, or perfumes. You may wear deodorant.  No use lociones, polvos o perfumes.  Puede usar desodorante.    Do not shave 48 hours prior to surgery. Men may shave face and neck.  No se afeite 48 horas antes de la [place].  Los hombres pueden [employer]  cara y el cuello.   Do not bring valuables to the hospital.   No lleve objetos [facility] is not responsible for any belongings or valuables.  [place] no se hace responsable de ningn tipo de pertenencias u objetos de Geographical information systems officer.               Contacts, dentures or bridgework may not be worn into surgery.  Los lentes de [place], las dentaduras postizas o puentes no se pueden usar en la [place].  Leave your suitcase in the car. After surgery it may be brought to your room.  Deje su maleta en el auto.  Despus de la ciruga podr traerla a su habitacin.  For patients admitted to the hospital, discharge time is determined by your treatment team.  Para los pacientes que sean ingresados al hospital, el tiempo en el cual se le dar de alta es determinado por su                equipo de [place].   Patients discharged the day of surgery will not be allowed to drive home. A los pacientes que se les da de alta el mismo da de la ciruga no se les permitir conducir a Holiday representative.   Please read over the  following fact sheets that you were given: Por favor [place] hojas de informacin que le dieron:   MRSA Information   __x__ Take these medicines the morning of surgery with A SIP OF WATER:          [employer] estas medicinas la maana de la ciruga con UN SORBO DE AGUA:  1. Need to call back with list of medicines  2.   3.   4.       5.  6.  ____ Fleet Enema (as directed)          Enema de Fleet (segn lo indicado)    __x__ Use CHG Soap as directed/sage wipes          Utilice el jabn de CHG segn lo indicado  ____ Use inhalers on the day of surgery          Use los inhaladores el da de la ciruga  ____ Stop metformin 2 days prior to surgery          Deje de tomar el metformin 2 das antes de la ciruga    __x__ Take 1/2 of usual insulin dose the night before surgery and none on the morning of surgery           Tome la mitad de la dosis habitual de insulina  la noche antes de la [place] y no tome nada en la maana de la             ciruga  ____ Stop Coumadin/Plavix/aspirin on           Deje de tomar el Coumadin/Plavix/aspirina el da:  ____ Stop Anti-inflammatories on           Deje de tomar antiinflamatorios el da:   ____ Stop supplements until after surgery            Deje de tomar suplementos hasta despus de la ciruga  ____ Bring C-Pap to the hospital          [facility]

## 2016-10-02 ENCOUNTER — Other Ambulatory Visit (INDEPENDENT_AMBULATORY_CARE_PROVIDER_SITE_OTHER): Payer: Self-pay | Admitting: Vascular Surgery

## 2016-10-02 NOTE — Pre-Procedure Instructions (Signed)
Sent request to Dr. Bunnie Domino office for orders and H&P.

## 2016-10-09 MED ORDER — CEFAZOLIN SODIUM-DEXTROSE 2-4 GM/100ML-% IV SOLN
2.0000 g | INTRAVENOUS | Status: AC
  Administered 2016-10-10: 2 g via INTRAVENOUS

## 2016-10-10 ENCOUNTER — Encounter: Payer: Self-pay | Admitting: *Deleted

## 2016-10-10 ENCOUNTER — Ambulatory Visit: Payer: Self-pay | Admitting: Anesthesiology

## 2016-10-10 ENCOUNTER — Encounter
Admission: RE | Disposition: A | Discharge status: Home / Self Care | Payer: Self-pay | Source: Ambulatory Visit | Attending: Vascular Surgery

## 2016-10-10 ENCOUNTER — Ambulatory Visit
Admission: RE | Admit: 2016-10-10 | Discharge: 2016-10-10 | Disposition: A | Discharge status: Home / Self Care | Payer: Self-pay | Source: Ambulatory Visit | Attending: Vascular Surgery | Admitting: Vascular Surgery

## 2016-10-10 DIAGNOSIS — N186 End stage renal disease: Secondary | ICD-10-CM | POA: Insufficient documentation

## 2016-10-10 DIAGNOSIS — E1140 Type 2 diabetes mellitus with diabetic neuropathy, unspecified: Secondary | ICD-10-CM | POA: Insufficient documentation

## 2016-10-10 DIAGNOSIS — I120 Hypertensive chronic kidney disease with stage 5 chronic kidney disease or end stage renal disease: Secondary | ICD-10-CM | POA: Insufficient documentation

## 2016-10-10 DIAGNOSIS — E785 Hyperlipidemia, unspecified: Secondary | ICD-10-CM | POA: Insufficient documentation

## 2016-10-10 DIAGNOSIS — Z794 Long term (current) use of insulin: Secondary | ICD-10-CM | POA: Insufficient documentation

## 2016-10-10 DIAGNOSIS — E1122 Type 2 diabetes mellitus with diabetic chronic kidney disease: Secondary | ICD-10-CM | POA: Insufficient documentation

## 2016-10-10 DIAGNOSIS — M810 Age-related osteoporosis without current pathological fracture: Secondary | ICD-10-CM | POA: Insufficient documentation

## 2016-10-10 DIAGNOSIS — Z992 Dependence on renal dialysis: Secondary | ICD-10-CM | POA: Insufficient documentation

## 2016-10-10 DIAGNOSIS — E11319 Type 2 diabetes mellitus with unspecified diabetic retinopathy without macular edema: Secondary | ICD-10-CM | POA: Insufficient documentation

## 2016-10-10 DIAGNOSIS — Z8673 Personal history of transient ischemic attack (TIA), and cerebral infarction without residual deficits: Secondary | ICD-10-CM | POA: Insufficient documentation

## 2016-10-10 HISTORY — PX: AV FISTULA PLACEMENT: SHX1204

## 2016-10-10 LAB — POCT I-STAT 4, (NA,K, GLUC, HGB,HCT)
Glucose, Bld: 104 mg/dL — ABNORMAL HIGH (ref 65–99)
HEMATOCRIT: 30 % — AB (ref 36.0–46.0)
HEMOGLOBIN: 10.2 g/dL — AB (ref 12.0–15.0)
POTASSIUM: 4.5 mmol/L (ref 3.5–5.1)
SODIUM: 138 mmol/L (ref 135–145)

## 2016-10-10 LAB — GLUCOSE, CAPILLARY: Glucose-Capillary: 107 mg/dL — ABNORMAL HIGH (ref 65–99)

## 2016-10-10 SURGERY — ARTERIOVENOUS (AV) FISTULA CREATION
Anesthesia: General | Laterality: Right

## 2016-10-10 MED ORDER — LABETALOL HCL 5 MG/ML IV SOLN
5.0000 mg | INTRAVENOUS | Status: DC | PRN
  Administered 2016-10-10 (×3): 5 mg via INTRAVENOUS

## 2016-10-10 MED ORDER — PHENYLEPHRINE HCL 10 MG/ML IJ SOLN
INTRAMUSCULAR | Status: DC | PRN
  Administered 2016-10-10: 50 ug via INTRAVENOUS

## 2016-10-10 MED ORDER — BUPIVACAINE-EPINEPHRINE (PF) 0.5% -1:200000 IJ SOLN
INTRAMUSCULAR | Status: DC | PRN
  Administered 2016-10-10: 6 mL

## 2016-10-10 MED ORDER — PROPOFOL 10 MG/ML IV BOLUS
INTRAVENOUS | Status: DC | PRN
  Administered 2016-10-10: 100 mg via INTRAVENOUS

## 2016-10-10 MED ORDER — FAMOTIDINE 20 MG PO TABS
ORAL_TABLET | ORAL | Status: AC
  Administered 2016-10-10: 20 mg via ORAL
  Filled 2016-10-10: qty 1

## 2016-10-10 MED ORDER — PROMETHAZINE HCL 25 MG/ML IJ SOLN: 6.2500 mg | INTRAMUSCULAR | Status: DC | PRN

## 2016-10-10 MED ORDER — FENTANYL CITRATE (PF) 100 MCG/2ML IJ SOLN: 25.0000 ug | INTRAMUSCULAR | Status: DC | PRN

## 2016-10-10 MED ORDER — SODIUM CHLORIDE 0.9 % IV SOLN
INTRAVENOUS | Status: DC
  Administered 2016-10-10: 14:00:00 via INTRAVENOUS

## 2016-10-10 MED ORDER — MEPERIDINE HCL 25 MG/ML IJ SOLN: 6.2500 mg | INTRAMUSCULAR | Status: DC | PRN

## 2016-10-10 MED ORDER — OXYCODONE HCL 5 MG/5ML PO SOLN: 5.0000 mg | Freq: Once | ORAL | Status: DC | PRN

## 2016-10-10 MED ORDER — CHLORHEXIDINE GLUCONATE CLOTH 2 % EX PADS: 6.0000 | MEDICATED_PAD | Freq: Once | CUTANEOUS | Status: DC

## 2016-10-10 MED ORDER — HYDROCODONE-ACETAMINOPHEN 5-325 MG PO TABS: 1.0000 | ORAL_TABLET | Freq: Four times a day (QID) | ORAL | 0 refills | Status: DC | PRN

## 2016-10-10 MED ORDER — BUPIVACAINE-EPINEPHRINE (PF) 0.5% -1:200000 IJ SOLN
INTRAMUSCULAR | Status: AC
  Filled 2016-10-10: qty 30

## 2016-10-10 MED ORDER — OXYCODONE HCL 5 MG PO TABS: 5.0000 mg | ORAL_TABLET | Freq: Once | ORAL | Status: DC | PRN

## 2016-10-10 MED ORDER — HEPARIN SODIUM (PORCINE) 5000 UNIT/ML IJ SOLN
INTRAMUSCULAR | Status: AC
  Filled 2016-10-10: qty 1

## 2016-10-10 MED ORDER — LABETALOL HCL 5 MG/ML IV SOLN
INTRAVENOUS | Status: DC | PRN
  Administered 2016-10-10: 5 mg via INTRAVENOUS

## 2016-10-10 MED ORDER — FAMOTIDINE 20 MG PO TABS
20.0000 mg | ORAL_TABLET | Freq: Once | ORAL | Status: AC
  Administered 2016-10-10: 20 mg via ORAL

## 2016-10-10 MED ORDER — DEXAMETHASONE SODIUM PHOSPHATE 10 MG/ML IJ SOLN
INTRAMUSCULAR | Status: DC | PRN
  Administered 2016-10-10: 10 mg via INTRAVENOUS

## 2016-10-10 MED ORDER — FENTANYL CITRATE (PF) 100 MCG/2ML IJ SOLN
INTRAMUSCULAR | Status: DC | PRN
  Administered 2016-10-10 (×2): 50 ug via INTRAVENOUS

## 2016-10-10 MED ORDER — LABETALOL HCL 5 MG/ML IV SOLN
INTRAVENOUS | Status: AC
  Administered 2016-10-10: 5 mg
  Filled 2016-10-10: qty 4

## 2016-10-10 MED ORDER — HEPARIN SODIUM (PORCINE) 1000 UNIT/ML IJ SOLN
INTRAMUSCULAR | Status: DC | PRN
  Administered 2016-10-10: 3 mL via INTRAVENOUS

## 2016-10-10 MED ORDER — LIDOCAINE HCL (CARDIAC) 20 MG/ML IV SOLN
INTRAVENOUS | Status: DC | PRN
  Administered 2016-10-10: 30 mg via INTRAVENOUS

## 2016-10-10 MED ORDER — PAPAVERINE HCL 30 MG/ML IJ SOLN
INTRAMUSCULAR | Status: AC
  Filled 2016-10-10: qty 2

## 2016-10-10 MED ORDER — CEFAZOLIN SODIUM-DEXTROSE 2-4 GM/100ML-% IV SOLN
INTRAVENOUS | Status: AC
  Administered 2016-10-10: 2 g via INTRAVENOUS
  Filled 2016-10-10: qty 100

## 2016-10-10 MED ORDER — MIDAZOLAM HCL 2 MG/2ML IJ SOLN
INTRAMUSCULAR | Status: DC | PRN
  Administered 2016-10-10: 2 mg via INTRAVENOUS

## 2016-10-10 SURGICAL SUPPLY — 49 items
BAG DECANTER FOR FLEXI CONT (MISCELLANEOUS) ×3 IMPLANT
BLADE SURG SZ11 CARB STEEL (BLADE) ×3 IMPLANT
BOOT SUTURE AID YELLOW STND (SUTURE) ×3 IMPLANT
BRUSH SCRUB 4% CHG (MISCELLANEOUS) ×3 IMPLANT
CANISTER SUCT 1200ML W/VALVE (MISCELLANEOUS) ×3 IMPLANT
CHLORAPREP W/TINT 26ML (MISCELLANEOUS) ×3 IMPLANT
CLIP SPRNG 6MM S-JAW DBL (CLIP) ×3
ELECT CAUTERY BLADE 6.4 (BLADE) ×3 IMPLANT
ELECT REM PT RETURN 9FT ADLT (ELECTROSURGICAL) ×3
ELECTRODE REM PT RTRN 9FT ADLT (ELECTROSURGICAL) ×1 IMPLANT
GEL ULTRASOUND 20GR AQUASONIC (MISCELLANEOUS) IMPLANT
GLOVE BIO SURGEON STRL SZ7 (GLOVE) ×6 IMPLANT
GLOVE INDICATOR 7.5 STRL GRN (GLOVE) ×3 IMPLANT
GOWN STRL REUS W/ TWL LRG LVL3 (GOWN DISPOSABLE) ×2 IMPLANT
GOWN STRL REUS W/ TWL XL LVL3 (GOWN DISPOSABLE) ×1 IMPLANT
GOWN STRL REUS W/TWL LRG LVL3 (GOWN DISPOSABLE) ×4
GOWN STRL REUS W/TWL XL LVL3 (GOWN DISPOSABLE) ×2
HEMOSTAT SURGICEL 2X3 (HEMOSTASIS) ×3 IMPLANT
IV NS 500ML (IV SOLUTION) ×2
IV NS 500ML BAXH (IV SOLUTION) ×1 IMPLANT
KIT RM TURNOVER STRD PROC AR (KITS) ×3 IMPLANT
LABEL OR SOLS (LABEL) ×3 IMPLANT
LIQUID BAND (GAUZE/BANDAGES/DRESSINGS) ×3 IMPLANT
LOOP RED MAXI  1X406MM (MISCELLANEOUS) ×2
LOOP VESSEL MAXI 1X406 RED (MISCELLANEOUS) ×1 IMPLANT
LOOP VESSEL MINI 0.8X406 BLUE (MISCELLANEOUS) ×1 IMPLANT
LOOPS BLUE MINI 0.8X406MM (MISCELLANEOUS) ×2
NEEDLE FILTER BLUNT 18X 1/2SAF (NEEDLE) ×2
NEEDLE FILTER BLUNT 18X1 1/2 (NEEDLE) ×1 IMPLANT
NEEDLE HYPO 30X.5 LL (NEEDLE) IMPLANT
NS IRRIG 500ML POUR BTL (IV SOLUTION) ×3 IMPLANT
PACK EXTREMITY ARMC (MISCELLANEOUS) ×3 IMPLANT
PAD PREP 24X41 OB/GYN DISP (PERSONAL CARE ITEMS) ×3 IMPLANT
SOLUTION CELL SAVER (CLIP) ×1 IMPLANT
STOCKINETTE STRL 4IN 9604848 (GAUZE/BANDAGES/DRESSINGS) ×3 IMPLANT
SUT MNCRL AB 4-0 PS2 18 (SUTURE) ×3 IMPLANT
SUT PROLENE 6 0 BV (SUTURE) ×12 IMPLANT
SUT SILK 2 0 (SUTURE) ×2
SUT SILK 2-0 18XBRD TIE 12 (SUTURE) ×1 IMPLANT
SUT SILK 3 0 (SUTURE) ×2
SUT SILK 3-0 18XBRD TIE 12 (SUTURE) ×1 IMPLANT
SUT SILK 4 0 (SUTURE) ×2
SUT SILK 4-0 18XBRD TIE 12 (SUTURE) ×1 IMPLANT
SUT VIC AB 3-0 SH 27 (SUTURE) ×4
SUT VIC AB 3-0 SH 27X BRD (SUTURE) ×2 IMPLANT
SYR 20CC LL (SYRINGE) ×3 IMPLANT
SYR 3ML LL SCALE MARK (SYRINGE) ×3 IMPLANT
SYR TB 1ML 27GX1/2 LL (SYRINGE) IMPLANT
TOWEL OR 17X26 4PK STRL BLUE (TOWEL DISPOSABLE) IMPLANT

## 2016-10-10 NOTE — Anesthesia Preprocedure Evaluation (Signed)
Anesthesia Evaluation  Patient identified by MRN, date of birth, ID band Patient awake    Reviewed: Allergy & Precautions, NPO status , Patient's Chart, lab work & pertinent test results  History of Anesthesia Complications Negative for: history of anesthetic complications  Airway Mallampati: II  TM Distance: >3 FB Neck ROM: Full    Dental  (+) Partial Upper, Poor Dentition, Missing   Pulmonary neg pulmonary ROS, neg sleep apnea, neg COPD,    breath sounds clear to auscultation- rhonchi (-) wheezing      Cardiovascular Exercise Tolerance: Good hypertension, Pt. on medications (-) CAD and (-) Past MI  Rhythm:Regular Rate:Normal - Systolic murmurs and - Diastolic murmurs    Neuro/Psych  Headaches, CVA (visual deficit), Residual Symptoms negative psych ROS   GI/Hepatic negative GI ROS, Neg liver ROS,   Endo/Other  diabetes, Type 2, Insulin Dependent  Renal/GU ESRF and DialysisRenal disease     Musculoskeletal   Abdominal (+) - obese,   Peds  Hematology negative hematology ROS (+)   Anesthesia Other Findings Past Medical History: No date: Acute cystitis with hematuria No date: Cataracts, bilateral No date: CKD (chronic kidney disease) No date: Diabetic retinopathy, background ([place]) No date: Dialysis patient ([place])     Comment: Tues, Thurs, Sat No date: Dizziness No date: Hyperlipidemia No date: Hypertension No date: Migraine variant No date: Neuropathy (HCC) No date: OP (osteoporosis) No date: Personal history of noncompliance with medical* No date: Stroke ([place]) No date: 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   Dental advisory given  Plan Discussed with: CRNA and Anesthesiologist  Anesthesia Plan Comments:         Anesthesia Quick Evaluation

## 2016-10-10 NOTE — OR Nursing (Signed)
Discharge instructions reviewed with patient and her daughter via an interpreter.  Medication was reviewed.  Instructed patient/daughter to get Hydralazine refilled as her blood pressure was elevated throughout the day today.  Daughter said that it was not refillable and should she call the doctor.  Explained that she might be able to just call the doctor for a refill or the doctor might want to see her first.  Patient states that she has been having very bad headaches for the last 4 days.  Explained that the lack of medication and high bp could be part of that.  Questions answered and patient feels comfortable going home.  She was confused as to why the permcath was not removed during surgery.  She thought that this fistula would be to start dialysis this weekend.  Explained all information to family.

## 2016-10-10 NOTE — H&P (Signed)
[facility] Admission History & Physical  MRN : [id number]  Anita Contreras is a 60 y.o. (Jun 26, 1956) female who presents with chief complaint of No chief complaint on file. Marland Kitchen  History of Present Illness: Patient presents today for placement of her permanent dialysis access. She is reasonably new to dialysis starting earlier this year. She has no specific complaints today. She is currently using her dialysis catheter and getting dialysis on Tuesdays, Thursdays, and Saturdays.  Current Facility-Administered Medications  Medication Dose Route Frequency Provider Last Rate Last Dose  . 0.9 %  sodium chloride infusion   Intravenous Continuous Andria Frames, MD 75 mL/hr at 10/10/16 1410    . ceFAZolin (ANCEF) 2-4 GM/100ML-% IVPB           . ceFAZolin (ANCEF) IVPB 2g/100 mL premix  2 g Intravenous On Call to [place], PA-C      . Chlorhexidine Gluconate Cloth 2 % PADS 6 each  6 each Topical Once [employer], PA-C       And  . Chlorhexidine Gluconate Cloth 2 % PADS 6 each  6 each Topical Once Sela Hua, PA-C        Past Medical History:  Diagnosis Date  . Acute cystitis with hematuria   . Cataracts, bilateral   . CKD (chronic kidney disease)   . Diabetic retinopathy, background ([place])   . Dialysis patient ([place])    Tues, Thurs, Sat  . Dizziness   . Hyperlipidemia   . Hypertension   . Migraine variant   . Neuropathy ([place])   . OP (osteoporosis)   . Personal history of noncompliance with medical treatment, presenting hazards to health   . Stroke ([place])   . Type II diabetes mellitus with ophthalmic manifestations, uncontrolled ([place])   . Vitamin D deficiency     Past Surgical History:  Procedure Laterality Date  . DILATION AND CURETTAGE OF UTERUS    . PERIPHERAL VASCULAR CATHETERIZATION N/A 04/25/2016   Procedure: Dialysis/Perma Catheter Insertion;  Surgeon: Algernon Huxley, MD;  Location: [facility];   Service: Cardiovascular;  Laterality: N/A;    Social History Social History  Substance Use Topics  . Smoking status: Never Smoker  . Smokeless tobacco: Never Used  . Alcohol use No  No IVDU  Family History Family History  Problem Relation Age of Onset  . Hypertension Mother   . Hyperlipidemia Mother   No bleeding disorders or clotting disorders  No Known Allergies   REVIEW OF SYSTEMS (Negative unless checked)  Constitutional: [] Weight loss  [] Fever  [] Chills Cardiac: [] Chest pain   [] Chest pressure   [] Palpitations   [] Shortness of breath when laying flat   [] Shortness of breath at rest   [] Shortness of breath with exertion. Vascular:  [] Pain in legs with walking   [] Pain in legs at rest   [] Pain in legs when laying flat   [] Claudication   [] Pain in feet when walking  [] Pain in feet at rest  [] Pain in feet when laying flat   [] History of DVT   [] Phlebitis   [] Swelling in legs   [] Varicose veins   [] Non-healing ulcers Pulmonary:   [] Uses home oxygen   [] Productive cough   [] Hemoptysis   [] Wheeze  [] COPD   [] Asthma Neurologic:  [] Dizziness  [] Blackouts   [] Seizures   [] History of stroke   [] History of TIA  [] Aphasia   [] Temporary blindness   [] Dysphagia   [] Weakness or numbness in arms   []   Weakness or numbness in legs Musculoskeletal:  [] Arthritis   [] Joint swelling   [] Joint pain   [] Low back pain Hematologic:  [] Easy bruising  [] Easy bleeding   [] Hypercoagulable state   [] Anemic  [] Hepatitis Gastrointestinal:  [] Blood in stool   [] Vomiting blood  [] Gastroesophageal reflux/heartburn   [] Difficulty swallowing. Genitourinary:  [] Chronic kidney disease   [] Difficult urination  [] Frequent urination  [] Burning with urination   [] Blood in urine Skin:  [] Rashes   [] Ulcers   [] Wounds Psychological:  [] History of anxiety   []  History of major depression.  Physical Examination  Vitals:   10/10/16 1347  BP: (!) 181/42  Pulse: (!) 54  Resp: 18  Temp: 97.7 F (36.5 C)  TempSrc: Tympanic   SpO2: 95%  Weight: 49.4 kg (109 lb)  Height: 4\' 3"  (1.295 m)   Body mass index is 29.46 kg/m. Gen: WD/WN, NAD Head: [place]/AT, No temporalis wasting. Prominent temp pulse not noted. Ear/Nose/Throat: Hearing grossly intact, nares w/o erythema or drainage, oropharynx w/o Erythema/Exudate,  Eyes: Conjunctiva clear, sclera non-icteric Neck: Trachea midline.  No JVD.  Pulmonary:  Good air movement, respirations not labored, no use of accessory muscles.  Cardiac: RRR, normal S1, S2. Vascular: permcath in place without erythema or drainage Vessel Right Left  Radial Palpable Palpable                                   Gastrointestinal: soft, non-tender/non-distended. No guarding/reflex.  Musculoskeletal: M/S 5/5 throughout.  Extremities without ischemic changes.  No deformity or atrophy.  Neurologic: Sensation grossly intact in extremities.  Symmetrical.  Speech is fluent. Motor exam as listed above. Psychiatric: Judgment intact, Mood & affect appropriate for pt's clinical situation. Dermatologic: No rashes or ulcers noted.  No cellulitis or open wounds. Lymph : No Cervical, Axillary, or Inguinal lymphadenopathy.     CBC Lab Results  Component Value Date   WBC 9.4 09/09/2016   HGB 10.2 (L) 10/10/2016   HCT 30.0 (L) 10/10/2016   MCV 88.4 09/09/2016   PLT 201 09/09/2016    BMET    Component Value Date/Time   NA 138 10/10/2016 1355   K 4.5 10/10/2016 1355   CL 106 09/09/2016 1510   CO2 25 09/09/2016 1510   GLUCOSE 104 (H) 10/10/2016 1355   BUN 54 (H) 09/09/2016 1510   CREATININE 4.65 (H) 09/09/2016 1510   CALCIUM 8.8 (L) 09/09/2016 1510   GFRNONAA 9 (L) 09/09/2016 1510   GFRAA 11 (L) 09/09/2016 1510   CrCl cannot be calculated (Patient's most recent lab result is older than the maximum 21 days allowed.).  COAG Lab Results  Component Value Date   INR 0.93 09/09/2016    Radiology No results found.   Assessment/Plan 1. End-stage renal disease. For permanent  dialysis placement today in the form of a right arm AV fistula. Risks and benefits discussed. 2. Diabetes. blood glucose control important in reducing the progression of atherosclerotic disease. Also, involved in wound healing. On appropriate medications. 3. Hypertension. blood pressure control important in reducing the progression of atherosclerotic disease. On appropriate oral medications.    [name], MD  10/10/2016 2:37 PM

## 2016-10-10 NOTE — Transfer of Care (Signed)
Immediate Anesthesia Transfer of Care Note  Patient: Anita Contreras  Procedure(s) Performed: Procedure(s): ARTERIOVENOUS (AV) FISTULA CREATION ( BRACHIOCEPHALIC ) (Right)  Patient Location: PACU  Anesthesia Type:General  Level of Consciousness: sedated and responds to stimulation  Airway & Oxygen Therapy: Patient Spontanous Breathing and Patient connected to face mask oxygen  Post-op Assessment: Report given to RN and Post -op Vital signs reviewed and stable  Post vital signs: Reviewed and stable  Last Vitals:  Vitals:   10/10/16 1347 10/10/16 1617  BP: (!) 181/42 (!) 198/73  Pulse: (!) 54 74  Resp: 18 12  Temp: 36.5 C     Last Pain:  Vitals:   10/10/16 1347  TempSrc: Tympanic         Complications: No apparent anesthesia complications

## 2016-10-10 NOTE — Op Note (Signed)
[facility]   OPERATIVE NOTE   PROCEDURE: Right brachiocephalic arteriovenous fistula placement  PRE-OPERATIVE DIAGNOSIS: 1.  ESRD      2. Hypertension  POST-OPERATIVE DIAGNOSIS: 1. ESRD     2. Hypertension  SURGEON: [name], MD  ASSISTANT(S): None  ANESTHESIA: general  ESTIMATED BLOOD LOSS: 10 cc  FINDING(S): Adequate cephalic vein for fistula creation  SPECIMEN(S):  none  INDICATIONS:   Anita Contreras is a 60 y.o. female who presents with renal failure in need of pemanent dialysis acces.  The patient is scheduled for right arm AVF placement.  The patient is aware the risks include but are not limited to: bleeding, infection, steal syndrome, nerve damage, ischemic monomelic neuropathy, failure to mature, and need for additional procedures.  The patient is aware of the risks of the procedure and elects to proceed forward.  DESCRIPTION: After full informed written consent was obtained from the patient, the patient was brought back to the operating room and placed supine upon the operating table.  Prior to induction, the patient received IV antibiotics.   After obtaining adequate anesthesia, the patient was then prepped and draped in the standard fashion for a right arm access procedure.  I made a curvilinear incision at the level of the antecubital fossa and dissected through the subcutaneous tissue and fascia to gain exposure of the brachial artery.  This was noted to be patent and adequate in size for fistula creation.  This was dissected out proximally and distally and prepared for control with vessel loops .  I then dissected out the cephalic vein.  This was noted to be patent and adequate in size for fistula creation.  I then gave the patient 3000 units of intravenous heparin.  The vein was marked for orientation and the distal segment of the vein was ligated with a  2-0 silk, and the vein was transected.  I then instilled the heparinized saline  into the vein and clamped it.  At this point, I reset my exposure of the brachial artery and pulled up control on the vessel loops.  I made an arteriotomy with a #11 blade, and then I extended the arteriotomy with a Potts scissor.  I injected heparinized saline proximal and distal to this arteriotomy.  The vein was then sewn to the artery in an end-to-side configuration with a running stitch of 6-0 Prolene.  Prior to completing this anastomosis, I allowed the vein and artery to backbleed.  There was no evidence of clot from any vessels.  I completed the anastomosis in the usual fashion and then released all vessel loops and clamps.  There was a palpable  thrill in the venous outflow, and there was a palpable pulse in the artery distal to the anastomosis.  At this point, I irrigated out the surgical wound.  Surgicel was placed. There was no further active bleeding.  The subcutaneous tissue was reapproximated with a running stitch of 3-0 Vicryl.  The skin was then closed with a 4-0 Monocryl suture.  The skin was then cleaned, dried, and reinforced with Dermabond.  The patient tolerated this procedure well and was taken to the recovery room in stable condition  COMPLICATIONS: None  CONDITION: Stable   [name]    10/10/2016, 4:10 PM  This note was created with Dragon Medical transcription system. Any errors in dictation are purely unintentional.

## 2016-10-10 NOTE — Anesthesia Procedure Notes (Signed)
Procedure Name: LMA Insertion Date/Time: 10/10/2016 3:10 PM Performed by: Jonna Clark Pre-anesthesia Checklist: Patient identified, Patient being monitored, Timeout performed, Emergency Drugs available and Suction available Patient Re-evaluated:Patient Re-evaluated prior to inductionOxygen Delivery Method: Circle system utilized Preoxygenation: Pre-oxygenation with 100% oxygen Intubation Type: IV induction Ventilation: Mask ventilation without difficulty LMA: LMA inserted LMA Size: 3.0 Tube type: Oral Number of attempts: 1 Placement Confirmation: positive ETCO2 and breath sounds checked- equal and bilateral Tube secured with: Tape Dental Injury: Teeth and Oropharynx as per pre-operative assessment

## 2016-10-11 ENCOUNTER — Encounter: Payer: Self-pay | Admitting: Vascular Surgery

## 2016-10-15 ENCOUNTER — Ambulatory Visit (INDEPENDENT_AMBULATORY_CARE_PROVIDER_SITE_OTHER): Payer: Self-pay

## 2016-10-15 ENCOUNTER — Other Ambulatory Visit (INDEPENDENT_AMBULATORY_CARE_PROVIDER_SITE_OTHER): Payer: Self-pay | Admitting: Vascular Surgery

## 2016-10-15 ENCOUNTER — Telehealth (INDEPENDENT_AMBULATORY_CARE_PROVIDER_SITE_OTHER): Payer: Self-pay

## 2016-10-15 ENCOUNTER — Encounter (INDEPENDENT_AMBULATORY_CARE_PROVIDER_SITE_OTHER): Payer: Self-pay | Admitting: Vascular Surgery

## 2016-10-15 ENCOUNTER — Ambulatory Visit (INDEPENDENT_AMBULATORY_CARE_PROVIDER_SITE_OTHER): Payer: Self-pay | Admitting: Vascular Surgery

## 2016-10-15 ENCOUNTER — Encounter (INDEPENDENT_AMBULATORY_CARE_PROVIDER_SITE_OTHER): Payer: Self-pay

## 2016-10-15 VITALS — BP 168/64 | HR 63 | Resp 16 | Ht <= 58 in | Wt 109.0 lb

## 2016-10-15 DIAGNOSIS — M79601 Pain in right arm: Secondary | ICD-10-CM

## 2016-10-15 DIAGNOSIS — T829XXA Unspecified complication of cardiac and vascular prosthetic device, implant and graft, initial encounter: Secondary | ICD-10-CM

## 2016-10-15 DIAGNOSIS — I10 Essential (primary) hypertension: Secondary | ICD-10-CM

## 2016-10-15 DIAGNOSIS — E785 Hyperlipidemia, unspecified: Secondary | ICD-10-CM

## 2016-10-15 DIAGNOSIS — E1122 Type 2 diabetes mellitus with diabetic chronic kidney disease: Secondary | ICD-10-CM | POA: Insufficient documentation

## 2016-10-15 DIAGNOSIS — Z992 Dependence on renal dialysis: Principal | ICD-10-CM

## 2016-10-15 DIAGNOSIS — E118 Type 2 diabetes mellitus with unspecified complications: Secondary | ICD-10-CM

## 2016-10-15 DIAGNOSIS — N186 End stage renal disease: Secondary | ICD-10-CM

## 2016-10-15 NOTE — Progress Notes (Signed)
Subjective:    Patient ID: Anita Contreras, female    DOB: 04/15/1956, 60 y.o.   MRN: [id number] Chief Complaint  Patient presents with  . Follow-up   Patient presents earlier than her scheduled post-operative visit with worsening right hand pain and numbness. On 10/10/16 the patient underwent a right brachiocephalic AV fistula creation. Patient currently being maintained by a right permcath without any issue. Denies pallor or ulcer formation to her right hand / fingers. She underwent a steal study which showed probable steal syndrome in right upper extremity.    Review of Systems  Constitutional: Negative.   HENT: Negative.   Eyes: Negative.   Respiratory: Negative.   Cardiovascular:       Right hand pain and numbess  Gastrointestinal: Negative.   Endocrine: Negative.   Genitourinary: Negative.   Musculoskeletal: Negative.   Skin: Negative.   Allergic/Immunologic: Negative.   Neurological: Negative.   Hematological: Negative.   Psychiatric/Behavioral: Negative.        Objective:   Physical Exam  Constitutional: She is oriented to person, place, and time. She appears well-developed and well-nourished.  HENT:  Head: Normocephalic and atraumatic.  Eyes: Conjunctivae and EOM are normal. Pupils are equal, round, and reactive to light.  Neck: Normal range of motion.  Cardiovascular: Normal rate, regular rhythm and normal heart sounds.   Pulses:      Radial pulses are 0 on the right side, and 2+ on the left side.       Dorsalis pedis pulses are 1+ on the right side, and 1+ on the left side.       Posterior tibial pulses are 1+ on the right side, and 1+ on the left side.  Right Brachio-Cephalic AV Fistula: Good bruit and thrill.   Pulmonary/Chest: Effort normal and breath sounds normal.  Abdominal: Soft. Bowel sounds are normal.  Musculoskeletal: Normal range of motion. She exhibits no edema.  Neurological: She is alert and oriented to person, place, and time.  Skin:  Skin is warm and dry.  Incision: Healing well.   Psychiatric: She has a normal mood and affect. Her behavior is normal. Judgment and thought content normal.   BP (!) 168/64   Pulse 63   Resp 16   Ht 4\' 6"  (1.372 m)   Wt 109 lb (49.4 kg)   LMP 09/19/2001 (Approximate)   BMI 26.28 kg/m   Past Medical History:  Diagnosis Date  . Acute cystitis with hematuria   . Cataracts, bilateral   . CKD (chronic kidney disease)   . Diabetic retinopathy, background ([place])   . Dialysis patient ([place])    Tues, Thurs, Sat  . Dizziness   . Hyperlipidemia   . Hypertension   . Migraine variant   . Neuropathy ([place])   . OP (osteoporosis)   . Personal history of noncompliance with medical treatment, presenting hazards to health   . Stroke ([place])   . Type II diabetes mellitus with ophthalmic manifestations, uncontrolled ([place])   . Vitamin D deficiency    Social History   Social History  . Marital status: Single    Spouse name: N/A  . Number of children: N/A  . Years of education: N/A   Occupational History  . Not on file.   Social History Main Topics  . Smoking status: Never Smoker  . Smokeless tobacco: Never Used  . Alcohol use No  . Drug use: No  . Sexual activity: Not on file   Other Topics Concern  . Not  on file   Social History Narrative   Lives at home with family      Past Surgical History:  Procedure Laterality Date  . AV FISTULA PLACEMENT Right 10/10/2016   Procedure: ARTERIOVENOUS (AV) FISTULA CREATION ( BRACHIOCEPHALIC );  Surgeon: Algernon Huxley, MD;  Location: [facility]S;  Service: Vascular;  Laterality: Right;  . DILATION AND CURETTAGE OF UTERUS    . PERIPHERAL VASCULAR CATHETERIZATION N/A 04/25/2016   Procedure: Dialysis/Perma Catheter Insertion;  Surgeon: Algernon Huxley, MD;  Location: [place] [facility];  Service: Cardiovascular;  Laterality: N/A;   Family History  Problem Relation Age of Onset  . Hypertension Mother   . Hyperlipidemia Mother    No Known Allergies      Assessment & Plan:  Patient presents earlier than her scheduled post-operative visit with worsening right hand pain and numbness. On 10/10/16 the patient underwent a right brachiocephalic AV fistula creation. Patient currently being maintained by a right permcath without any issue. Denies pallor or ulcer formation to her right hand / fingers. She underwent a steal study which showed probable steal syndrome in right upper extremity.   1. ESRD on dialysis ([place]) - Stable Continue dialysis through right permcath for now.   2. Complication from renal dialysis device, initial encounter - New Patient with probable steal syndrome to right hand s/p right brachiocephalic AV fistula creation. Recommend right upper extremity angiogram to assess anatomy and degree or steal. Procedure, risks and benefits explained to patient and daughter who was present. All questions answered. Patient wishes to proceed.   3. Hyperlipidemia, unspecified hyperlipidemia type - Stable On statin for medical optimization. Encouraged good control as its slows the progression of atherosclerotic disease  4. Essential hypertension - Stable Encouraged good control as its slows the progression of atherosclerotic disease  5. Controlled diabetes mellitus type 2 with complications, unspecified long term insulin use status (HCC) - Stable Encouraged good control as its slows the progression of atherosclerotic disease  Current Outpatient Prescriptions on File Prior to Visit  Medication Sig Dispense Refill  . acetaminophen (TYLENOL) 500 MG tablet Take 1,000 mg by mouth every 6 (six) hours as needed for mild pain or headache.    Marland Kitchen amLODipine (NORVASC) 10 MG tablet Take 1 tablet (10 mg total) by mouth daily. 30 tablet 0  . atorvastatin (LIPITOR) 40 MG tablet Take 40 mg by mouth daily at 6 PM.     . calcium acetate (PHOSLO) 667 MG capsule Take 1 capsule (667 mg total) by mouth 3 (three) times daily with meals. 90 capsule 0  . hydrALAZINE  (APRESOLINE) 25 MG tablet Take 1 tablet (25 mg total) by mouth 3 (three) times daily. 90 tablet 0  . HYDROcodone-acetaminophen (NORCO) 5-325 MG tablet Take 1 tablet by mouth every 6 (six) hours as needed for moderate pain. 30 tablet 0  . hydrOXYzine (ATARAX/VISTARIL) 25 MG tablet Take 25 mg by mouth 2 (two) times daily.    . insulin NPH-regular Human (NOVOLIN 70/30) (70-30) 100 UNIT/ML injection Inject 5 Units into the skin 2 (two) times daily with a meal.     . losartan (COZAAR) 50 MG tablet Take 1 tablet (50 mg total) by mouth daily. 30 tablet 0  . metoprolol tartrate (LOPRESSOR) 25 MG tablet Take 1 tablet (25 mg total) by mouth 2 (two) times daily. 60 tablet 0  . Nutritional Supplements (FEEDING SUPPLEMENT, NEPRO CARB STEADY,) LIQD Take 237 mLs by mouth 2 (two) times daily between meals. 60 Can 0  .  traMADol (ULTRAM) 50 MG tablet Take 1 tablet (50 mg total) by mouth every 6 (six) hours as needed. 20 tablet 0   No current facility-administered medications on file prior to visit.     There are no Patient Instructions on file for this visit. No Follow-up on file.   Gwyn Mehring A Amela Handley, PA-C

## 2016-10-15 NOTE — Telephone Encounter (Signed)
ERROR

## 2016-10-15 NOTE — Anesthesia Postprocedure Evaluation (Signed)
Anesthesia Post Note  Patient: Anita Contreras  Procedure(s) Performed: Procedure(s) (LRB): ARTERIOVENOUS (AV) FISTULA CREATION ( BRACHIOCEPHALIC ) (Right)  Patient location during evaluation: PACU Anesthesia Type: General Level of consciousness: awake and alert Pain management: pain level controlled Vital Signs Assessment: post-procedure vital signs reviewed and stable Respiratory status: spontaneous breathing, nonlabored ventilation and respiratory function stable Cardiovascular status: blood pressure returned to baseline and stable Postop Assessment: no signs of nausea or vomiting Anesthetic complications: no    Last Vitals:  Vitals:   10/10/16 1739 10/10/16 1839  BP: (!) 194/66 (!) 179/60  Pulse: 81 73  Resp: 16 15  Temp: 36.2 C     Last Pain:  Vitals:   10/11/16 1004  TempSrc:   PainSc: 0-No pain                 Roston Grunewald

## 2016-10-17 ENCOUNTER — Ambulatory Visit
Admission: RE | Admit: 2016-10-17 | Discharge: 2016-10-17 | Disposition: A | Discharge status: Home / Self Care | Payer: Self-pay | Source: Ambulatory Visit | Attending: Vascular Surgery | Admitting: Vascular Surgery

## 2016-10-17 ENCOUNTER — Encounter
Admission: RE | Disposition: A | Discharge status: Home / Self Care | Payer: Self-pay | Source: Ambulatory Visit | Attending: Vascular Surgery

## 2016-10-17 DIAGNOSIS — T82898A Other specified complication of vascular prosthetic devices, implants and grafts, initial encounter: Secondary | ICD-10-CM | POA: Insufficient documentation

## 2016-10-17 DIAGNOSIS — E11319 Type 2 diabetes mellitus with unspecified diabetic retinopathy without macular edema: Secondary | ICD-10-CM | POA: Insufficient documentation

## 2016-10-17 DIAGNOSIS — M810 Age-related osteoporosis without current pathological fracture: Secondary | ICD-10-CM | POA: Insufficient documentation

## 2016-10-17 DIAGNOSIS — E1122 Type 2 diabetes mellitus with diabetic chronic kidney disease: Secondary | ICD-10-CM | POA: Insufficient documentation

## 2016-10-17 DIAGNOSIS — E1140 Type 2 diabetes mellitus with diabetic neuropathy, unspecified: Secondary | ICD-10-CM | POA: Insufficient documentation

## 2016-10-17 DIAGNOSIS — Z8673 Personal history of transient ischemic attack (TIA), and cerebral infarction without residual deficits: Secondary | ICD-10-CM | POA: Insufficient documentation

## 2016-10-17 DIAGNOSIS — E1136 Type 2 diabetes mellitus with diabetic cataract: Secondary | ICD-10-CM | POA: Insufficient documentation

## 2016-10-17 DIAGNOSIS — Z8249 Family history of ischemic heart disease and other diseases of the circulatory system: Secondary | ICD-10-CM | POA: Insufficient documentation

## 2016-10-17 DIAGNOSIS — Y832 Surgical operation with anastomosis, bypass or graft as the cause of abnormal reaction of the patient, or of later complication, without mention of misadventure at the time of the procedure: Secondary | ICD-10-CM | POA: Insufficient documentation

## 2016-10-17 DIAGNOSIS — T82868A Thrombosis of vascular prosthetic devices, implants and grafts, initial encounter: Secondary | ICD-10-CM

## 2016-10-17 DIAGNOSIS — I129 Hypertensive chronic kidney disease with stage 1 through stage 4 chronic kidney disease, or unspecified chronic kidney disease: Secondary | ICD-10-CM | POA: Insufficient documentation

## 2016-10-17 DIAGNOSIS — N186 End stage renal disease: Secondary | ICD-10-CM | POA: Insufficient documentation

## 2016-10-17 DIAGNOSIS — E785 Hyperlipidemia, unspecified: Secondary | ICD-10-CM | POA: Insufficient documentation

## 2016-10-17 DIAGNOSIS — Z992 Dependence on renal dialysis: Secondary | ICD-10-CM | POA: Insufficient documentation

## 2016-10-17 DIAGNOSIS — Z9114 Patient's other noncompliance with medication regimen: Secondary | ICD-10-CM | POA: Insufficient documentation

## 2016-10-17 DIAGNOSIS — I9789 Other postprocedural complications and disorders of the circulatory system, not elsewhere classified: Secondary | ICD-10-CM

## 2016-10-17 HISTORY — PX: PERIPHERAL VASCULAR CATHETERIZATION: SHX172C

## 2016-10-17 LAB — POTASSIUM (ARMC VASCULAR LAB ONLY): POTASSIUM (ARMC VASCULAR LAB): 4.6 (ref 3.5–5.1)

## 2016-10-17 SURGERY — UPPER EXTREMITY ANGIOGRAPHY
Anesthesia: Moderate Sedation | Site: Arm Upper | Laterality: Right

## 2016-10-17 MED ORDER — MIDAZOLAM HCL 2 MG/2ML IJ SOLN
INTRAMUSCULAR | Status: DC | PRN
  Administered 2016-10-17: 2 mg via INTRAVENOUS

## 2016-10-17 MED ORDER — ONDANSETRON HCL 4 MG/2ML IJ SOLN: 4.0000 mg | Freq: Four times a day (QID) | INTRAMUSCULAR | Status: DC | PRN

## 2016-10-17 MED ORDER — MIDAZOLAM HCL 2 MG/2ML IJ SOLN
INTRAMUSCULAR | Status: AC
  Filled 2016-10-17: qty 2

## 2016-10-17 MED ORDER — HYDROMORPHONE HCL 1 MG/ML IJ SOLN: 1.0000 mg | Freq: Once | INTRAMUSCULAR | Status: DC

## 2016-10-17 MED ORDER — HEPARIN SODIUM (PORCINE) 1000 UNIT/ML IJ SOLN
INTRAMUSCULAR | Status: DC | PRN
  Administered 2016-10-17: 3000 [IU] via INTRAVENOUS

## 2016-10-17 MED ORDER — METOPROLOL TARTRATE 5 MG/5ML IV SOLN: 2.0000 mg | INTRAVENOUS | Status: DC | PRN

## 2016-10-17 MED ORDER — ACETAMINOPHEN 325 MG PO TABS: 325.0000 mg | ORAL_TABLET | ORAL | Status: DC | PRN

## 2016-10-17 MED ORDER — DEXTROSE 5 % IV SOLN: 1.5000 g | INTRAVENOUS | Status: DC

## 2016-10-17 MED ORDER — LABETALOL HCL 5 MG/ML IV SOLN: 10.0000 mg | INTRAVENOUS | Status: DC | PRN

## 2016-10-17 MED ORDER — GUAIFENESIN-DM 100-10 MG/5ML PO SYRP: 15.0000 mL | ORAL_SOLUTION | ORAL | Status: DC | PRN

## 2016-10-17 MED ORDER — SODIUM CHLORIDE 0.9 % IV SOLN: 500.0000 mL | Freq: Once | INTRAVENOUS | Status: DC | PRN

## 2016-10-17 MED ORDER — ACETAMINOPHEN 325 MG RE SUPP
325.0000 mg | RECTAL | Status: DC | PRN
  Filled 2016-10-17: qty 2

## 2016-10-17 MED ORDER — FENTANYL CITRATE (PF) 100 MCG/2ML IJ SOLN
INTRAMUSCULAR | Status: AC
  Filled 2016-10-17: qty 2

## 2016-10-17 MED ORDER — HYDROMORPHONE HCL 1 MG/ML IJ SOLN: 0.5000 mg | INTRAMUSCULAR | Status: DC | PRN

## 2016-10-17 MED ORDER — LIDOCAINE-EPINEPHRINE (PF) 1 %-1:200000 IJ SOLN
INTRAMUSCULAR | Status: AC
  Filled 2016-10-17: qty 30

## 2016-10-17 MED ORDER — METHYLPREDNISOLONE SODIUM SUCC 125 MG IJ SOLR: 125.0000 mg | INTRAMUSCULAR | Status: DC | PRN

## 2016-10-17 MED ORDER — OXYCODONE-ACETAMINOPHEN 5-325 MG PO TABS: 1.0000 | ORAL_TABLET | ORAL | Status: DC | PRN

## 2016-10-17 MED ORDER — HYDRALAZINE HCL 20 MG/ML IJ SOLN: 5.0000 mg | INTRAMUSCULAR | Status: DC | PRN

## 2016-10-17 MED ORDER — FENTANYL CITRATE (PF) 100 MCG/2ML IJ SOLN
INTRAMUSCULAR | Status: DC | PRN
  Administered 2016-10-17: 50 ug via INTRAVENOUS

## 2016-10-17 MED ORDER — PHENOL 1.4 % MT LIQD: 1.0000 | OROMUCOSAL | Status: DC | PRN

## 2016-10-17 MED ORDER — FAMOTIDINE 20 MG PO TABS: 40.0000 mg | ORAL_TABLET | ORAL | Status: DC | PRN

## 2016-10-17 MED ORDER — HEPARIN SODIUM (PORCINE) 1000 UNIT/ML IJ SOLN
INTRAMUSCULAR | Status: AC
  Filled 2016-10-17: qty 1

## 2016-10-17 MED ORDER — SODIUM CHLORIDE 0.9 % IV SOLN: INTRAVENOUS | Status: DC

## 2016-10-17 SURGICAL SUPPLY — 12 items
BALLN ULTRVRSE 2X220X150 (BALLOONS) ×3
BALLOON ULTRVRSE 2X220X150 (BALLOONS) ×1 IMPLANT
CATH ANGIO 5F 100CM .035 PIG (CATHETERS) ×3 IMPLANT
CATH H1 100CM (CATHETERS) ×3 IMPLANT
DEVICE PRESTO INFLATION (MISCELLANEOUS) ×3 IMPLANT
DEVICE STARCLOSE SE CLOSURE (Vascular Products) ×3 IMPLANT
DEVICE TORQUE (MISCELLANEOUS) ×6 IMPLANT
GLIDEWIRE ANGLED SS 035X260CM (WIRE) ×6 IMPLANT
PACK ANGIOGRAPHY (CUSTOM PROCEDURE TRAY) ×3 IMPLANT
SHEATH BRITE TIP 5FRX11 (SHEATH) ×3 IMPLANT
TUBING CONTRAST HIGH PRESS 72 (TUBING) ×3 IMPLANT
WIRE G V18X300CM (WIRE) ×3 IMPLANT

## 2016-10-17 NOTE — H&P (Signed)
[facility] History & Physical Update  The patient was interviewed and re-examined.  The patient's previous History and Physical has been reviewed and is unchanged.  There is no change in the plan of care. We plan to proceed with the scheduled procedure.  [name], MD  10/17/2016, 10:29 AM

## 2016-10-17 NOTE — Discharge Instructions (Signed)
Angiogram, Care After °Refer to this sheet in the next few weeks. These instructions provide you with information about caring for yourself after your procedure. Your health care provider may also give you more specific instructions. Your treatment has been planned according to current medical practices, but problems sometimes occur. Call your health care provider if you have any problems or questions after your procedure. °WHAT TO EXPECT AFTER THE PROCEDURE °After your procedure, it is typical to have the following: °· Bruising at the catheter insertion site that usually fades within 1-2 weeks. °· Blood collecting in the tissue (hematoma) that may be painful to the touch. It should usually decrease in size and tenderness within 1-2 weeks. °HOME CARE INSTRUCTIONS °· Take medicines only as directed by your health care provider. °· You may shower 24-48 hours after the procedure or as directed by your health care provider. Remove the bandage (dressing) and gently wash the site with plain soap and water. Pat the area dry with a clean towel. Do not rub the site, because this may cause bleeding. °· Do not take baths, swim, or use a hot tub until your health care provider approves. °· Check your insertion site every day for redness, swelling, or drainage. °· Do not apply powder or lotion to the site. °· Do not lift over 10 lb (4.5 kg) for 5 days after your procedure or as directed by your health care provider. °· Ask your health care provider when it is okay to: °¨ Return to work or school. °¨ Resume usual physical activities or sports. °¨ Resume sexual activity. °· Do not drive home if you are discharged the same day as the procedure. Have someone else drive you. °· You may drive 24 hours after the procedure unless otherwise instructed by your health care provider. °· Do not operate machinery or power tools for 24 hours after the procedure or as directed by your health care provider. °· If your procedure was done as an  outpatient procedure, which means that you went home the same day as your procedure, a responsible adult should be with you for the first 24 hours after you arrive home. °· Keep all follow-up visits as directed by your health care provider. This is important. °SEEK MEDICAL CARE IF: °· You have a fever. °· You have chills. °· You have increased bleeding from the catheter insertion site. Hold pressure on the site. °SEEK IMMEDIATE MEDICAL CARE IF: °· You have unusual pain at the catheter insertion site. °· You have redness, warmth, or swelling at the catheter insertion site. °· You have drainage (other than a small amount of blood on the dressing) from the catheter insertion site. °· The catheter insertion site is bleeding, and the bleeding does not stop after 30 minutes of holding steady pressure on the site. °· The area near or just beyond the catheter insertion site becomes pale, cool, tingly, or numb. °  °This information is not intended to replace advice given to you by your health care provider. Make sure you discuss any questions you have with your health care provider. °  °Document Released: 06/13/2005 Document Revised: 12/16/2014 Document Reviewed: 04/28/2013 °Elsevier Interactive Patient Education ©2016 [employer] ° °

## 2016-10-17 NOTE — Op Note (Signed)
OPERATIVE REPORT   PREOPERATIVE DIAGNOSIS: 1. End-stage renal disease. 2. Steal syndrome, right arm with patent right brachiocephalic AVF.  POSTOPERATIVE DIAGNOSIS: 1. End-stage renal disease. 2. Steal syndrome, right arm with patent right brachiocephalic AVF  PROCEDURE PERFORMED: 1. Ultrasound guidance vascular access to right femoral artery. 2. Catheter placement to right radial artery and right ulnar arteries  from right femoral approach. 3. Thoracic aortogram and selective right upper extremity angiogram  including selective images of the radial and ulnar arteries. 4. Percutaneous transluminal angioplasty of the right ulnar artery with 2 mm diameter by 22 cm length angioplasty balloon 5. StarClose closure device right femoral artery.  SURGEON: Algernon Huxley, MD  ANESTHESIA: Local with moderate conscious sedation for 25 minutes using 2 mg of Versed and 50 mcg of Fentanyl  BLOOD LOSS: Minimal.  FLUOROSCOPY TIME: 4.7  INDICATION FOR PROCEDURE: This is a 60 year old female who presented to our office with steal syndrome. The patient's right brachiocephalic A-V fistula is working well, but her hand is numb and painful. To further evaluate this to determine what options would be possible to treat the steal syndrome, angiogram of the left upper extremity is indicated. Risks and benefits are discussed. Informed consent was obtained.  DESCRIPTION OF PROCEDURE: The patient was brought to the vascular suite. Moderate conscious sedation was administered during a face to face encounter with the patient throughout the procedure with my supervision of the RN administering medicines and monitoring the patient's vital signs, pulse oximetry, telemetry and mental status throughout from the start of the procedure until the patient was taken to the recovery room.  Groins were shaved and prepped and sterile surgical field was created. The right femoral head was localized  with fluoroscopy and the right femoral artery was then visualized with ultrasound and found to be widely patent. It was then accessed under direct ultrasound guidance without difficulty with a Seldinger needle and a permanent image was recorded. A J-wire and 5-French sheath were then placed. Pigtail catheter was placed into the ascending aorta and a thoracic aortogram was then performed in the LAO projection. This demonstrated normal origins to the great vessels without significant proximal stenoses and a normal configuration of the great vessels. The patient was given 3000 units of intravenous heparin and a headhunter catheter was used to selectively cannulate the innominate and then the right subclavian artery without difficulty.Imaging demonstrated a normal right subclavian artery, axillary artery, and brachial artery without significant stenosis proximal to the fistula. This was then sequentially advanced to the brachial artery and to the brachial bifurcation.  Steal was demonstrated with all of the flow from the artery going into the fistula and not downstream to the hand on images with the catheter proximal to the access. I then advanced a catheter beyond the access and into the radial and ulnar arteries.  The radial artery was entered first. Imaging demonstrated a radial artery without significant stenosis with continuous flow into the hand within the injection was from the radial artery itself. After this, the catheter was pulled back and a 0.018 wire was then advanced with a headhunter catheter and placed into the ulnar artery, this was evaluated. Findings in the ulnar artery on the right demonstrated significant narrowing in a very small vessel with the proximal to midportion of the ulnar artery appearing to have greater than 75% stenosis over about a 10-12 cm area. I used a 2 mm diameter by 22 cm length angioplasty balloon inflated from the distal ulnar artery in the  distal forearm up  through the origin of the ulnar artery at the brachial bifurcation. This was inflated to 14 atm for 1 minute. Significant spasm was seen following this with the stenosis now appeared to be less than 40%. Nonetheless, with the catheter pulled back into the brachial artery proximal to the fistula, significant steal was still demonstrated. At this point, I did not feel there was any other endovascular option and felt that surgical therapy will likely be required to improve her steal symptoms if they do not improve with her treatment today.  The diagnostic catheter was removed. Oblique arteriogram was performed of the right femoral artery and StarClose closure device deployed in the usual fashion with excellent hemostatic result. The patient tolerated the procedure well and was taken to the recovery room in stable condition.   Anita Contreras 10/17/2016 1:04 PM

## 2016-10-18 ENCOUNTER — Emergency Department
Admission: EM | Admit: 2016-10-18 | Discharge: 2016-10-18 | Disposition: A | Discharge status: Home / Self Care | Payer: Self-pay | Attending: Emergency Medicine | Admitting: Emergency Medicine

## 2016-10-18 ENCOUNTER — Encounter: Payer: Self-pay | Admitting: Vascular Surgery

## 2016-10-18 DIAGNOSIS — Z794 Long term (current) use of insulin: Secondary | ICD-10-CM | POA: Insufficient documentation

## 2016-10-18 DIAGNOSIS — R200 Anesthesia of skin: Secondary | ICD-10-CM

## 2016-10-18 DIAGNOSIS — Z992 Dependence on renal dialysis: Secondary | ICD-10-CM | POA: Insufficient documentation

## 2016-10-18 DIAGNOSIS — I120 Hypertensive chronic kidney disease with stage 5 chronic kidney disease or end stage renal disease: Secondary | ICD-10-CM | POA: Insufficient documentation

## 2016-10-18 DIAGNOSIS — N186 End stage renal disease: Secondary | ICD-10-CM | POA: Insufficient documentation

## 2016-10-18 DIAGNOSIS — E1122 Type 2 diabetes mellitus with diabetic chronic kidney disease: Secondary | ICD-10-CM | POA: Insufficient documentation

## 2016-10-18 DIAGNOSIS — Z79899 Other long term (current) drug therapy: Secondary | ICD-10-CM | POA: Insufficient documentation

## 2016-10-18 DIAGNOSIS — Y733 Surgical instruments, materials and gastroenterology and urology devices (including sutures) associated with adverse incidents: Secondary | ICD-10-CM | POA: Insufficient documentation

## 2016-10-18 DIAGNOSIS — T82898A Other specified complication of vascular prosthetic devices, implants and grafts, initial encounter: Secondary | ICD-10-CM | POA: Insufficient documentation

## 2016-10-18 NOTE — ED Provider Notes (Signed)
[facility] Emergency Department Provider Note  Time seen: 9:39 PM  I have reviewed the triage vital signs and the nursing notes.   HISTORY  Chief Complaint Numbness    HPI Anita Contreras is a 60 y.o. female end-stage renal disease on hemodialysis for the past 5 months who presents the emergency department with right arm numbness. Patient had a right AV shunt placed 10/10/16. Patient was seen 10/15/16 for numbness of the right arm. She underwent a right upper extremity angiography by Dr. Lucky Cowboy yesterday and diagnosed with steal syndrome. He attempted arterial dilation to promote better blood flow to the distal extremity, but the patient continues to experience numbness so she came back to the emergency department for evaluation.   Past Medical History:  Diagnosis Date  . Acute cystitis with hematuria   . Cataracts, bilateral   . CKD (chronic kidney disease)   . Diabetic retinopathy, background ([place])   . Dialysis patient ([place])    Tues, Thurs, Sat  . Dizziness   . Hyperlipidemia   . Hypertension   . Migraine variant   . Neuropathy ([place])   . OP (osteoporosis)   . Personal history of noncompliance with medical treatment, presenting hazards to health   . Stroke ([place])   . Type II diabetes mellitus with ophthalmic manifestations, uncontrolled ([place])   . Vitamin D deficiency     Patient Active Problem List   Diagnosis Date Noted  . ESRD on dialysis ([place]) 10/15/2016  . Renal dialysis device, implant, or graft complication 81/19/1478  . Hyperlipidemia 10/15/2016  . Essential hypertension 10/15/2016  . Controlled diabetes mellitus type 2 with complications ([place]) 29/56/2130  . Acute on chronic renal failure ([place]) 04/23/2016  . 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 Acute right flank pain   . ARF (acute renal failure) ([place]) 04/15/2016  . Acute cystitis 05/26/2015  . Neuropathy ([place]) 05/26/2015    Past Surgical  History:  Procedure Laterality Date  . AV FISTULA PLACEMENT Right 10/10/2016   Procedure: ARTERIOVENOUS (AV) FISTULA CREATION ( BRACHIOCEPHALIC );  Surgeon: Algernon Huxley, MD;  Location: [facility] ORS;  Service: Vascular;  Laterality: Right;  . DILATION AND CURETTAGE OF UTERUS    . PERIPHERAL VASCULAR CATHETERIZATION N/A 04/25/2016   Procedure: Dialysis/Perma Catheter Insertion;  Surgeon: Algernon Huxley, MD;  Location: [facility];  Service: Cardiovascular;  Laterality: N/A;  . PERIPHERAL VASCULAR CATHETERIZATION Right 10/17/2016   Procedure: Upper Extremity Angiography;  Surgeon: Algernon Huxley, MD;  Location: [facility];  Service: Cardiovascular;  Laterality: Right;    Prior to Admission medications   Medication Sig Start Date End Date Taking? Authorizing Provider  acetaminophen (TYLENOL) 500 MG tablet Take 1,000 mg by mouth every 6 (six) hours as needed for mild pain or headache.    Historical Provider, MD  amLODipine (NORVASC) 10 MG tablet Take 1 tablet (10 mg total) by mouth daily. 04/19/16   Fritzi Mandes, MD  atorvastatin (LIPITOR) 40 MG tablet Take 40 mg by mouth daily at 6 PM.     Historical Provider, MD  calcium acetate (PHOSLO) 667 MG capsule Take 1 capsule (667 mg total) by mouth 3 (three) times daily with meals. 04/19/16   Fritzi Mandes, MD  hydrALAZINE (APRESOLINE) 25 MG tablet Take 1 tablet (25 mg total) by mouth 3 (three) times daily. 04/19/16   Fritzi Mandes, MD  HYDROcodone-acetaminophen (NORCO) 5-325 MG tablet Take 1 tablet by mouth every 6 (six) hours as needed  for moderate pain. 10/10/16   Algernon Huxley, MD  hydrOXYzine (ATARAX/VISTARIL) 25 MG tablet Take 25 mg by mouth 2 (two) times daily.    Historical Provider, MD  insulin NPH-regular Human (NOVOLIN 70/30) (70-30) 100 UNIT/ML injection Inject 5 Units into the skin 2 (two) times daily with a meal.     Historical Provider, MD  losartan (COZAAR) 50 MG tablet Take 1 tablet (50 mg total) by mouth daily. 05/07/16   Epifanio Lesches, MD   metoprolol tartrate (LOPRESSOR) 25 MG tablet Take 1 tablet (25 mg total) by mouth 2 (two) times daily. 04/19/16   Fritzi Mandes, MD  Nutritional Supplements (FEEDING SUPPLEMENT, NEPRO CARB STEADY,) LIQD Take 237 mLs by mouth 2 (two) times daily between meals. 05/07/16   Epifanio Lesches, MD  traMADol (ULTRAM) 50 MG tablet Take 1 tablet (50 mg total) by mouth every 6 (six) hours as needed. 05/22/16   Daymon Larsen, MD    No Known Allergies  Family History  Problem Relation Age of Onset  . Hypertension Mother   . Hyperlipidemia Mother     Social History Social History  Substance Use Topics  . Smoking status: Never Smoker  . Smokeless tobacco: Never Used  . Alcohol use No    Review of Systems Constitutional: Negative for fever. Cardiovascular: Negative for chest pain. Respiratory: Negative for shortness of breath. Gastrointestinal: Negative for abdominal pain Musculoskeletal:Right distal upper extremity numbness/tingling  Neurological:Right arm numbness, no leg numbness. Denies weakness. Denies pain.  10-point ROS otherwise negative.  ____________________________________________   PHYSICAL EXAM:  VITAL SIGNS: ED Triage Vitals  Enc Vitals Group     BP 10/18/16 2126 (!) 133/56     Pulse Rate 10/18/16 2126 (!) 54     Resp 10/18/16 2126 18     Temp 10/18/16 2126 97.6 F (36.4 C)     Temp Source 10/18/16 2126 Oral     SpO2 10/18/16 2126 97 %     Weight 10/18/16 2127 110 lb (49.9 kg)     Height 10/18/16 2127 4\' 5"  (1.346 m)     Head Circumference --      Peak Flow --      Pain Score 10/18/16 2127 0     Pain Loc --      Pain Edu? --      Excl. in [place]? --     Constitutional: Alert and oriented. Well appearing and in no distress. Eyes: Normal exam ENT   Head: Normocephalic and atraumatic   Mouth/Throat: Mucous membranes are moist. Cardiovascular: Normal rate, regular rhythm. No murmur Respiratory: Normal respiratory effort without tachypnea nor retractions.  Breath sounds are clear. Right chest dialysis line present.  Gastrointestinal: Soft and nontender. Musculoskeletal:Right upper extremity AV shunt with good thrill. 1+ radial pulse on the right, 2+ on the left. Distal right upper extremity is more cool than the left upper extremity but not cold.  Neurologic:  Normal speech and language. Slightly weakened right upper trauma grip strength compared to left upper extremity. Lower extremities are equal. No cranial nerve deficits.  Skin:  Skin is warm, dry and intact.  Psychiatric: Mood and affect are normal. Speech and behavior are normal.      INITIAL IMPRESSION / ASSESSMENT AND PLAN / ED COURSE  Pertinent labs & imaging results that were available during my care of the patient were reviewed by me and considered in my medical decision making (see chart for details).  Patient presents the emergency department with right arm numbness  and tingling. Patient is seen by Dr. Lucky Cowboy for the same with an angiogram performed yesterday consistent with steal syndrome. I discussed the patient with Dr. Delana Meyer, who states there is nothing to be done at the moment to improve her symptoms. Dr Bunnie Domino notes reflect the same, that she may require a surgery in the future. Patient will be seen in the office on Tuesday. I discussed with a patient using warm compresses to the distal arm, patient agreeable plan.  ____________________________________________   FINAL CLINICAL IMPRESSION(S) / ED DIAGNOSES  Right upper extremity numbness Steal syndrome    Harvest Dark, MD 10/18/16 2200

## 2016-10-18 NOTE — ED Triage Notes (Signed)
Per interpreter, Patient c/o right arm numbness "it's dead." s/p fistula placement on 11/2. Patient states numbness started with two fingers on right hand and has progressed up the arm. Patient followed with physician yesterday for these complaints and he removed "the coagulated blood." Patient reports short-term relief of the numbness, but now is feeling the same as before. Patient goes to dialysis on Tues, Thurs, and Saturday using  The right side chest port.

## 2016-10-18 NOTE — Discharge Instructions (Signed)
Please call the number provided for vascular surgery Monday morning to arrange a follow-up appointment for Tuesday. Return to the emergency department for any pain in the right upper extremity, or any other symptom personally concerning to yourself. As we discussed he may use warm compresses to the right forearm and hand to help with discomfort. Please ensure the warm compresses are not hot enough to burn your skin

## 2016-10-18 NOTE — ED Notes (Signed)
Pt discharged to home.  Family member driving.  Discharge instructions reviewed.  Verbalized understanding.  No questions or concerns at this time.  Teach back verified.  Pt in NAD.  No items left in ED.   

## 2016-10-21 ENCOUNTER — Telehealth (INDEPENDENT_AMBULATORY_CARE_PROVIDER_SITE_OTHER): Payer: Self-pay | Admitting: Vascular Surgery

## 2016-10-21 NOTE — Telephone Encounter (Signed)
Would we need to schedule her for a steal study before then ?

## 2016-10-21 NOTE — Telephone Encounter (Signed)
Patient's daughter, [name], called and stated that her mother's arm is numb. Had procedure on Thursday with Dew.

## 2016-10-23 DIAGNOSIS — M79602 Pain in left arm: Secondary | ICD-10-CM | POA: Insufficient documentation

## 2016-10-25 ENCOUNTER — Encounter (INDEPENDENT_AMBULATORY_CARE_PROVIDER_SITE_OTHER): Payer: Self-pay

## 2016-10-25 ENCOUNTER — Ambulatory Visit (INDEPENDENT_AMBULATORY_CARE_PROVIDER_SITE_OTHER): Payer: Self-pay | Admitting: Vascular Surgery

## 2016-10-25 VITALS — BP 123/46 | HR 53 | Resp 17 | Ht <= 58 in | Wt 108.0 lb

## 2016-10-25 DIAGNOSIS — N186 End stage renal disease: Secondary | ICD-10-CM

## 2016-10-25 DIAGNOSIS — E118 Type 2 diabetes mellitus with unspecified complications: Secondary | ICD-10-CM

## 2016-10-25 DIAGNOSIS — I10 Essential (primary) hypertension: Secondary | ICD-10-CM

## 2016-10-25 DIAGNOSIS — E785 Hyperlipidemia, unspecified: Secondary | ICD-10-CM

## 2016-10-25 DIAGNOSIS — T829XXD Unspecified complication of cardiac and vascular prosthetic device, implant and graft, subsequent encounter: Secondary | ICD-10-CM

## 2016-10-25 DIAGNOSIS — Z992 Dependence on renal dialysis: Secondary | ICD-10-CM

## 2016-10-25 NOTE — Assessment & Plan Note (Signed)
Fistula seems to be maturing well, but she now has steal syndrome.  This is not improved with distal UE angioplasty. At this point, the best likelihood of maintaining patency of a new fistula and providing adequate flow to the hand would be with a DRIL procedure. Specifics of this procedure including the risks and benefits were all discussed with the patient. They're agreeable to proceed.

## 2016-10-25 NOTE — Assessment & Plan Note (Signed)
blood glucose control important in reducing the progression of atherosclerotic disease. Also, involved in wound healing. On appropriate medications.  

## 2016-10-25 NOTE — Assessment & Plan Note (Signed)
blood pressure control important in reducing the progression of atherosclerotic disease. On appropriate oral medications.  

## 2016-10-25 NOTE — Progress Notes (Signed)
MRN : [id number]  Anita Contreras is a 60 y.o. (July 05, 1956) female who presents with chief complaint of  Chief Complaint  Patient presents with  . Re-evaluation    1 week wound check  .  History of Present Illness: Patient returns today in follow up of Her right arm dialysis access and steal syndrome. Her fistula was placed about 2 weeks ago and she had a angiogram with distal upper extremity intervention beyond the fistula last week. Despite this, she continues to have significant steal symptoms in her right hand.  Current Outpatient Prescriptions  Medication Sig Dispense Refill  . acetaminophen (TYLENOL) 500 MG tablet Take 1,000 mg by mouth every 6 (six) hours as needed for mild pain or headache.    Marland Kitchen amLODipine (NORVASC) 10 MG tablet Take 1 tablet (10 mg total) by mouth daily. 30 tablet 0  . atorvastatin (LIPITOR) 40 MG tablet Take 40 mg by mouth daily at 6 PM.     . calcium acetate (PHOSLO) 667 MG capsule Take 1 capsule (667 mg total) by mouth 3 (three) times daily with meals. 90 capsule 0  . hydrALAZINE (APRESOLINE) 25 MG tablet Take 1 tablet (25 mg total) by mouth 3 (three) times daily. 90 tablet 0  . HYDROcodone-acetaminophen (NORCO) 5-325 MG tablet Take 1 tablet by mouth every 6 (six) hours as needed for moderate pain. 30 tablet 0  . hydrOXYzine (ATARAX/VISTARIL) 25 MG tablet Take 25 mg by mouth 2 (two) times daily.    . insulin NPH-regular Human (NOVOLIN 70/30) (70-30) 100 UNIT/ML injection Inject 5 Units into the skin 2 (two) times daily with a meal.     . losartan (COZAAR) 50 MG tablet Take 1 tablet (50 mg total) by mouth daily. 30 tablet 0  . metoprolol tartrate (LOPRESSOR) 25 MG tablet Take 1 tablet (25 mg total) by mouth 2 (two) times daily. 60 tablet 0  . Nutritional Supplements (FEEDING SUPPLEMENT, NEPRO CARB STEADY,) LIQD Take 237 mLs by mouth 2 (two) times daily between meals. 60 Can 0  . traMADol (ULTRAM) 50 MG tablet Take 1 tablet (50 mg total) by mouth every  6 (six) hours as needed. 20 tablet 0   No current facility-administered medications for this visit.     Past Medical History:  Diagnosis Date  . Acute cystitis with hematuria   . Cataracts, bilateral   . CKD (chronic kidney disease)   . Diabetic retinopathy, background ([place])   . Dialysis patient ([place])    Tues, Thurs, Sat  . Dizziness   . Hyperlipidemia   . Hypertension   . Migraine variant   . Neuropathy ([place])   . OP (osteoporosis)   . Personal history of noncompliance with medical treatment, presenting hazards to health   . Stroke ([place])   . Type II diabetes mellitus with ophthalmic manifestations, uncontrolled ([place])   . Vitamin D deficiency     Past Surgical History:  Procedure Laterality Date  . AV FISTULA PLACEMENT Right 10/10/2016   Procedure: ARTERIOVENOUS (AV) FISTULA CREATION ( BRACHIOCEPHALIC );  Surgeon: Algernon Huxley, MD;  Location: [facility] ORS;  Service: Vascular;  Laterality: Right;  . DILATION AND CURETTAGE OF UTERUS    . PERIPHERAL VASCULAR CATHETERIZATION N/A 04/25/2016   Procedure: Dialysis/Perma Catheter Insertion;  Surgeon: Algernon Huxley, MD;  Location: [facility];  Service: Cardiovascular;  Laterality: N/A;  . PERIPHERAL VASCULAR CATHETERIZATION Right 10/17/2016   Procedure: Upper Extremity Angiography;  Surgeon: Algernon Huxley, MD;  Location: [place] [facility]  CV LAB;  Service: Cardiovascular;  Laterality: Right;    Social History Social History  Substance Use Topics  . Smoking status: Never Smoker  . Smokeless tobacco: Never Used  . Alcohol use No     Family History Family History  Problem Relation Age of Onset  . Hypertension Mother   . Hyperlipidemia Mother      No Known Allergies   REVIEW OF SYSTEMS (Negative unless checked)  Constitutional: '[]' Weight loss  '[]' Fever  '[]' Chills Cardiac: '[]' Chest pain   '[]' Chest pressure   '[]' Palpitations   '[]' Shortness of breath when laying flat   '[]' Shortness of breath at rest   '[]' Shortness of breath with  exertion. Vascular:  '[]' Pain in legs with walking   '[]' Pain in legs at rest   '[]' Pain in legs when laying flat   '[]' Claudication   '[]' Pain in feet when walking  '[]' Pain in feet at rest  '[]' Pain in feet when laying flat   '[]' History of DVT   '[]' Phlebitis   '[]' Swelling in legs   '[]' Varicose veins   '[]' Non-healing ulcers Pulmonary:   '[]' Uses home oxygen   '[]' Productive cough   '[]' Hemoptysis   '[]' Wheeze  '[]' COPD   '[]' Asthma Neurologic:  '[]' Dizziness  '[]' Blackouts   '[]' Seizures   '[]' History of stroke   '[]' History of TIA  '[]' Aphasia   '[]' Temporary blindness   '[]' Dysphagia   '[x]' Weakness or numbness in arms   '[]' Weakness or numbness in legs Musculoskeletal:  '[]' Arthritis   '[]' Joint swelling   '[]' Joint pain   '[]' Low back pain Hematologic:  '[]' Easy bruising  '[]' Easy bleeding   '[]' Hypercoagulable state   '[]' Anemic   Gastrointestinal:  '[]' Blood in stool   '[]' Vomiting blood  '[]' Gastroesophageal reflux/heartburn   '[]' Abdominal pain Genitourinary:  '[x]' Chronic kidney disease   '[]' Difficult urination  '[]' Frequent urination  '[]' Burning with urination   '[]' Hematuria Skin:  '[]' Rashes   '[]' Ulcers   '[]' Wounds Psychological:  '[]' History of anxiety   '[]'  History of major depression.  Physical Examination  BP (!) 123/46 (BP Location: Left Arm)   Pulse (!) 53   Resp 17   Ht '4\' 5"'  (1.346 m)   Wt 108 lb (49 kg)   LMP 09/19/2001 (Approximate)   BMI 27.03 kg/m  Gen:  WD/WN, NAD Head: [place]/AT, No temporalis wasting. Ear/Nose/Throat: Hearing grossly intact, nares w/o erythema or drainage, trachea midline Eyes: Conjunctiva clear. Sclera non-icteric Neck: Supple.  No JVD.  Pulmonary:  Good air movement, no use of accessory muscles.  Cardiac: RRR, normal S1, S2 Vascular: Good thrill and bruit are present in right arm AV fistula Vessel Right Left  Radial Not Palpable Palpable                                   Gastrointestinal: soft, non-tender/non-distended. No guarding/reflex.  Musculoskeletal: M/S 5/5 throughout.  No deformity or atrophy.  Neurologic:  Sensation grossly intact in extremities.  Symmetrical.  Speech is fluent.  Psychiatric: Judgment intact, Mood & affect appropriate for pt's clinical situation. Dermatologic: No rashes or ulcers noted.  No cellulitis or open wounds. Lymph : No Cervical, Axillary, or Inguinal lymphadenopathy.      Labs Recent Results (from the past 2160 hour(s))  APTT     Status: Abnormal   Collection Time: 09/09/16  3:10 PM  Result Value Ref Range   aPTT 37 (H) 24 - 36 seconds    Comment:        IF BASELINE aPTT IS ELEVATED, SUGGEST PATIENT  RISK ASSESSMENT BE USED TO DETERMINE APPROPRIATE ANTICOAGULANT THERAPY.   Basic metabolic panel     Status: Abnormal   Collection Time: 09/09/16  3:10 PM  Result Value Ref Range   Sodium 141 135 - 145 mmol/L   Potassium 5.9 (H) 3.5 - 5.1 mmol/L   Chloride 106 101 - 111 mmol/L   CO2 25 22 - 32 mmol/L   Glucose, Bld 29 (LL) 65 - 99 mg/dL    Comment: CRITICAL RESULT CALLED TO, READ BACK BY AND VERIFIED WITH DR. [name] Joreen Swearingin ON 09/09/16 AT 1959 BY TLB    BUN 54 (H) 6 - 20 mg/dL   Creatinine, Ser 4.65 (H) 0.44 - 1.00 mg/dL   Calcium 8.8 (L) 8.9 - 10.3 mg/dL   GFR calc non Af Amer 9 (L) >60 mL/min   GFR calc Af Amer 11 (L) >60 mL/min    Comment: (NOTE) The eGFR has been calculated using the CKD EPI equation. This calculation has not been validated in all clinical situations. eGFR's persistently <60 mL/min signify possible Chronic Kidney Disease.    Anion gap 10 5 - 15  CBC WITH DIFFERENTIAL     Status: Abnormal   Collection Time: 09/09/16  3:10 PM  Result Value Ref Range   WBC 9.4 3.6 - 11.0 K/uL   RBC 4.39 3.80 - 5.20 MIL/uL   Hemoglobin 12.7 12.0 - 16.0 g/dL   HCT 38.8 35.0 - 47.0 %   MCV 88.4 80.0 - 100.0 fL   MCH 29.1 26.0 - 34.0 pg   MCHC 32.9 32.0 - 36.0 g/dL   RDW 19.2 (H) 11.5 - 14.5 %   Platelets 201 150 - 440 K/uL   Neutrophils Relative % 55 %   Neutro Abs 5.2 1.4 - 6.5 K/uL   Lymphocytes Relative 35 %   Lymphs Abs 3.3 1.0 - 3.6 K/uL    Monocytes Relative 7 %   Monocytes Absolute 0.6 0.2 - 0.9 K/uL   Eosinophils Relative 2 %   Eosinophils Absolute 0.2 0 - 0.7 K/uL   Basophils Relative 1 %   Basophils Absolute 0.1 0 - 0.1 K/uL  Protime-INR     Status: None   Collection Time: 09/09/16  3:10 PM  Result Value Ref Range   Prothrombin Time 12.5 11.4 - 15.2 seconds   INR 0.93   Type and screen     Status: None   Collection Time: 09/09/16  3:10 PM  Result Value Ref Range   ABO/RH(D) O POS    Antibody Screen NEG    Sample Expiration 09/23/2016    Extend sample reason NO TRANSFUSIONS OR PREGNANCY IN THE PAST 3 MONTHS   Surgical pcr screen     Status: Abnormal   Collection Time: 09/09/16  3:10 PM  Result Value Ref Range   MRSA, PCR NEGATIVE NEGATIVE   Staphylococcus aureus POSITIVE (A) NEGATIVE    Comment:        The Xpert SA Assay (FDA approved for NASAL specimens in patients over 20 years of age), is one component of a comprehensive surveillance program.  Test performance has been validated by [facility] for patients greater than or equal to 41 year old. It is not intended to diagnose infection nor to guide or monitor treatment.   Glucose, capillary     Status: Abnormal   Collection Time: 09/18/16 10:41 AM  Result Value Ref Range   Glucose-Capillary 107 (H) 65 - 99 mg/dL  ABO/Rh     Status: None  Collection Time: 09/18/16 10:55 AM  Result Value Ref Range   ABO/RH(D) O POS   Potassium     Status: Abnormal   Collection Time: 09/18/16 10:56 AM  Result Value Ref Range   Potassium 6.5 (HH) 3.5 - 5.1 mmol/L    Comment: CRITICAL RESULT CALLED TO, READ BACK BY AND VERIFIED WITH LOIS HENRY '@1207'  ON 09/18/16 BY HKP   I-STAT 4, (NA,K, GLUC, HGB,HCT)     Status: Abnormal   Collection Time: 09/18/16 11:04 AM  Result Value Ref Range   Sodium 139 135 - 145 mmol/L   Potassium 6.1 (H) 3.5 - 5.1 mmol/L   Glucose, Bld 120 (H) 65 - 99 mg/dL   HCT 36.0 36.0 - 46.0 %   Hemoglobin 12.2 12.0 - 15.0 g/dL  I-STAT 4, (NA,K,  GLUC, HGB,HCT)     Status: Abnormal   Collection Time: 09/18/16 11:12 AM  Result Value Ref Range   Sodium 138 135 - 145 mmol/L   Potassium 6.2 (H) 3.5 - 5.1 mmol/L   Glucose, Bld 118 (H) 65 - 99 mg/dL   HCT 36.0 36.0 - 46.0 %   Hemoglobin 12.2 12.0 - 15.0 g/dL  Type and screen [facility]     Status: None   Collection Time: 10/01/16  3:30 PM  Result Value Ref Range   ABO/RH(D) O POS    Antibody Screen NEG    Sample Expiration 10/15/2016    Extend sample reason NO TRANSFUSIONS OR PREGNANCY IN THE PAST 3 MONTHS   Glucose, capillary     Status: Abnormal   Collection Time: 10/10/16  1:46 PM  Result Value Ref Range   Glucose-Capillary 107 (H) 65 - 99 mg/dL  I-STAT 4, (NA,K, GLUC, HGB,HCT)     Status: Abnormal   Collection Time: 10/10/16  1:55 PM  Result Value Ref Range   Sodium 138 135 - 145 mmol/L   Potassium 4.5 3.5 - 5.1 mmol/L   Glucose, Bld 104 (H) 65 - 99 mg/dL   HCT 30.0 (L) 36.0 - 46.0 %   Hemoglobin 10.2 (L) 12.0 - 15.0 g/dL  Potassium [facility] vascular lab only)     Status: None   Collection Time: 10/17/16 10:21 AM  Result Value Ref Range   Potassium [facility] vascular lab) 4.6 3.5 - 5.1    Radiology No results found.    Assessment/Plan  Essential hypertension blood pressure control important in reducing the progression of atherosclerotic disease. On appropriate oral medications.   Controlled diabetes mellitus type 2 with complications (HCC) blood glucose control important in reducing the progression of atherosclerotic disease. Also, involved in wound healing. On appropriate medications.   ESRD on dialysis ([place]) Fistula seems to be maturing well, but she now has steal syndrome.  This is not improved with distal UE angioplasty. At this point, the best likelihood of maintaining patency of a new fistula and providing adequate flow to the hand would be with a DRIL procedure. Specifics of this procedure including the risks and benefits were all discussed  with the patient. They're agreeable to proceed.  Renal dialysis device, implant, or graft complication Fistula seems to be maturing well, but she now has steal syndrome.  This is not improved with distal UE angioplasty. At this point, the best likelihood of maintaining patency of a new fistula and providing adequate flow to the hand would be with a DRIL procedure. Specifics of this procedure including the risks and benefits were all discussed with the patient. They're agreeable to  proceed.    [name], MD  10/25/2016 3:05 PM    This note was created with Dragon medical transcription system.  Any errors from dictation are purely unintentional

## 2016-10-28 ENCOUNTER — Encounter
Admission: RE | Admit: 2016-10-28 | Discharge: 2016-10-28 | Disposition: A | Discharge status: Home / Self Care | Payer: Self-pay | Source: Ambulatory Visit | Attending: Vascular Surgery | Admitting: Vascular Surgery

## 2016-10-28 ENCOUNTER — Other Ambulatory Visit (INDEPENDENT_AMBULATORY_CARE_PROVIDER_SITE_OTHER): Payer: Self-pay | Admitting: Vascular Surgery

## 2016-10-28 DIAGNOSIS — N186 End stage renal disease: Secondary | ICD-10-CM | POA: Insufficient documentation

## 2016-10-28 DIAGNOSIS — Z01812 Encounter for preprocedural laboratory examination: Secondary | ICD-10-CM | POA: Insufficient documentation

## 2016-10-28 HISTORY — DX: Anemia, unspecified: D64.9

## 2016-10-28 HISTORY — DX: Other specified complication of vascular prosthetic devices, implants and grafts, initial encounter: T82.898A

## 2016-10-28 LAB — CBC WITH DIFFERENTIAL/PLATELET
BASOS ABS: 0 10*3/uL (ref 0–0.1)
BASOS PCT: 1 %
EOS ABS: 0.4 10*3/uL (ref 0–0.7)
EOS PCT: 5 %
HEMATOCRIT: 32.6 % — AB (ref 35.0–47.0)
Hemoglobin: 10.7 g/dL — ABNORMAL LOW (ref 12.0–16.0)
Lymphocytes Relative: 22 %
Lymphs Abs: 1.6 10*3/uL (ref 1.0–3.6)
MCH: 30.4 pg (ref 26.0–34.0)
MCHC: 32.8 g/dL (ref 32.0–36.0)
MCV: 92.7 fL (ref 80.0–100.0)
MONO ABS: 0.5 10*3/uL (ref 0.2–0.9)
MONOS PCT: 7 %
Neutro Abs: 4.6 10*3/uL (ref 1.4–6.5)
Neutrophils Relative %: 65 %
PLATELETS: 193 10*3/uL (ref 150–440)
RBC: 3.52 MIL/uL — ABNORMAL LOW (ref 3.80–5.20)
RDW: 16.3 % — AB (ref 11.5–14.5)
WBC: 7.1 10*3/uL (ref 3.6–11.0)

## 2016-10-28 LAB — TYPE AND SCREEN
ABO/RH(D): O POS
Antibody Screen: NEGATIVE

## 2016-10-28 LAB — PROTIME-INR
INR: 0.95
PROTHROMBIN TIME: 12.7 s (ref 11.4–15.2)

## 2016-10-28 LAB — BASIC METABOLIC PANEL
ANION GAP: 11 (ref 5–15)
BUN: 52 mg/dL — AB (ref 6–20)
CALCIUM: 8.3 mg/dL — AB (ref 8.9–10.3)
CO2: 28 mmol/L (ref 22–32)
CREATININE: 5.93 mg/dL — AB (ref 0.44–1.00)
Chloride: 94 mmol/L — ABNORMAL LOW (ref 101–111)
GFR calc Af Amer: 8 mL/min — ABNORMAL LOW (ref >60)
GFR, EST NON AFRICAN AMERICAN: 7 mL/min — AB (ref >60)
GLUCOSE: 233 mg/dL — AB (ref 65–99)
Potassium: 4.9 mmol/L (ref 3.5–5.1)
Sodium: 133 mmol/L — ABNORMAL LOW (ref 135–145)

## 2016-10-28 LAB — SURGICAL PCR SCREEN
MRSA, PCR: NEGATIVE
Staphylococcus aureus: NEGATIVE

## 2016-10-28 LAB — APTT: APTT: 31 s (ref 24–36)

## 2016-10-28 NOTE — Patient Instructions (Signed)
Your procedure is scheduled IR:[id number] 30, 2017 (Thursday) Su procedimiento est programado para: Report to Day Surgery.Second Floor, Medical Despina Arias a: To find out your arrival time please call [phone] between 1PM - 3PM on November 06, 2016 (Wednesday) Para saber su hora de [place] por favor llame al ([place]:  Remember: Instructions that are not followed completely may result in serious medical risk, up to and including death, or upon the discretion of your surgeon and anesthesiologist your surgery may need to be rescheduled.  Recuerde: Las instrucciones que no se siguen completamente Heritage manager en un riesgo de salud grave, incluyendo hasta la [place] o a discrecin de su cirujano y Environmental health practitioner, su ciruga se puede posponer.   __x__ 1. Do not eat food or drink liquids after midnight. No gum chewing or hard candies.  No coma alimentos ni tome lquidos despus de la medianoche.  No mastique chicle ni caramelos  duros.     __x__ 2. No alcohol for 24 hours before or after surgery.    No tome alcohol durante las 24 horas antes ni despus de la [place].   ____ 3. Bring all medications with you on the day of surgery if instructed.    Lleve todos los medicamentos con usted el da de su ciruga si se le ha indicado as.   __x__ 4. Notify your doctor if there is any change in your medical condition (cold, fever,                             infections).    Informe a su mdico si hay algn cambio en su condicin mdica (resfriado, fiebre, infecciones).   Do not wear jewelry, make-up, hairpins, clips or nail polish.  No use joyas, maquillajes, pinzas/ganchos para el cabello ni esmalte de uas.  Do not wear lotions, powders, or perfumes. You may wear deodorant.  No use lociones, polvos o perfumes.  Puede usar desodorante.    Do not shave 48 hours prior to surgery. Men may shave face and neck.  No se afeite 48 horas antes de la [place].  Los  hombres pueden [employer] cara y el cuello.   Do not bring valuables to the hospital.   No lleve objetos [facility] is not responsible for any belongings or valuables.  [place] no se hace responsable de ningn tipo de pertenencias u objetos de Geographical information systems officer.               Contacts, dentures or bridgework may not be worn into surgery.  Los lentes de [place], las dentaduras postizas o puentes no se pueden usar en la [place].  Leave your suitcase in the car. After surgery it may be brought to your room.  Deje su maleta en el auto.  Despus de la ciruga podr traerla a su habitacin.  For patients admitted to the hospital, discharge time is determined by your treatment team.  Para los pacientes que sean ingresados al hospital, el tiempo en el cual se le dar de alta es determinado por su                equipo de [place].   Patients discharged the day of surgery will not be allowed to drive home. A los pacientes que se les da de alta el mismo da de la ciruga no se les permitir conducir a [place] representative.   Please read  over the following fact sheets that you were given: Por favor [place] hojas de informacin que le dieron:   MRSA Information   __x__ Take these medicines the morning of surgery with A SIP OF WATER:          [employer] medicinas la maana de la ciruga con UN SORBO DE AGUA:  1. Amlodipine  2. Losartan  3. Metoprolol  4.  HydrAlazine     5.  6.  ____ Fleet Enema (as directed)          Enema de Fleet (segn lo indicado)    ____ Use CHG Soap as directed          Utilice el jabn de CHG segn lo indicado  ____ Use inhalers on the day of surgery          Use los inhaladores el da de la ciruga  ____ Stop metformin 2 days prior to surgery          Deje de tomar el metformin 2 das antes de la ciruga    _x___ Take 1/2 of usual insulin dose the night before surgery and none on the morning of surgery  (NO INSULIN THE MORNING OF SURGERY)           Tome la mitad de la dosis habitual de insulina la noche antes de la [place] y no tome nada en la maana de la             ciruga  _x___ Stop Coumadin/Plavix/aspirin on (NO ASPIRIN)          Deje de tomar el Coumadin/Plavix/aspirina el da:  _x___ Stop Anti-inflammatories on (NO NSAIDS)          Deje de tomar antiinflamatorios el da:   ____ Stop supplements until after surgery            Deje de tomar suplementos hasta despus de la ciruga  ____ Bring C-Pap to the hospital          [place] al hospital

## 2016-10-29 NOTE — Pre-Procedure Instructions (Signed)
CBC and Met B sent to Dr. Dew and Anesthesia for review. 

## 2016-11-06 MED ORDER — CEFAZOLIN SODIUM-DEXTROSE 2-4 GM/100ML-% IV SOLN
2.0000 g | INTRAVENOUS | Status: AC
  Administered 2016-11-08: 2 g via INTRAVENOUS

## 2016-11-07 MED ORDER — CHLORHEXIDINE GLUCONATE CLOTH 2 % EX PADS: 6.0000 | MEDICATED_PAD | Freq: Once | CUTANEOUS | Status: DC

## 2016-11-07 MED ORDER — CEFAZOLIN SODIUM-DEXTROSE 2-4 GM/100ML-% IV SOLN
INTRAVENOUS | Status: AC
  Administered 2016-11-08: 2 g via INTRAVENOUS
  Filled 2016-11-07: qty 100

## 2016-11-07 MED ORDER — FAMOTIDINE 20 MG PO TABS
ORAL_TABLET | ORAL | Status: AC
  Administered 2016-11-08: 20 mg via ORAL
  Filled 2016-11-07: qty 1

## 2016-11-07 MED ORDER — SODIUM CHLORIDE 0.9 % IV SOLN
INTRAVENOUS | Status: DC
  Administered 2016-11-08: 13:00:00 via INTRAVENOUS

## 2016-11-07 MED ORDER — FAMOTIDINE 20 MG PO TABS
20.0000 mg | ORAL_TABLET | Freq: Once | ORAL | Status: AC
  Administered 2016-11-08: 20 mg via ORAL

## 2016-11-08 ENCOUNTER — Observation Stay
Admission: RE | Admit: 2016-11-08 | Discharge: 2016-11-10 | Disposition: A | Discharge status: Home / Self Care | Payer: Medicaid Other | Source: Ambulatory Visit | Attending: Vascular Surgery | Admitting: Vascular Surgery

## 2016-11-08 ENCOUNTER — Encounter: Payer: Self-pay | Admitting: *Deleted

## 2016-11-08 ENCOUNTER — Ambulatory Visit: Payer: Medicaid Other | Admitting: Registered Nurse

## 2016-11-08 ENCOUNTER — Encounter
Admission: RE | Disposition: A | Discharge status: Home / Self Care | Payer: Self-pay | Source: Ambulatory Visit | Attending: Vascular Surgery

## 2016-11-08 DIAGNOSIS — E1122 Type 2 diabetes mellitus with diabetic chronic kidney disease: Secondary | ICD-10-CM | POA: Diagnosis not present

## 2016-11-08 DIAGNOSIS — E785 Hyperlipidemia, unspecified: Secondary | ICD-10-CM | POA: Diagnosis not present

## 2016-11-08 DIAGNOSIS — N186 End stage renal disease: Secondary | ICD-10-CM | POA: Insufficient documentation

## 2016-11-08 DIAGNOSIS — Z794 Long term (current) use of insulin: Secondary | ICD-10-CM | POA: Diagnosis not present

## 2016-11-08 DIAGNOSIS — E11319 Type 2 diabetes mellitus with unspecified diabetic retinopathy without macular edema: Secondary | ICD-10-CM | POA: Insufficient documentation

## 2016-11-08 DIAGNOSIS — Z992 Dependence on renal dialysis: Secondary | ICD-10-CM | POA: Insufficient documentation

## 2016-11-08 DIAGNOSIS — Z8673 Personal history of transient ischemic attack (TIA), and cerebral infarction without residual deficits: Secondary | ICD-10-CM | POA: Insufficient documentation

## 2016-11-08 DIAGNOSIS — Y832 Surgical operation with anastomosis, bypass or graft as the cause of abnormal reaction of the patient, or of later complication, without mention of misadventure at the time of the procedure: Secondary | ICD-10-CM | POA: Diagnosis not present

## 2016-11-08 DIAGNOSIS — T82898A Other specified complication of vascular prosthetic devices, implants and grafts, initial encounter: Principal | ICD-10-CM | POA: Insufficient documentation

## 2016-11-08 DIAGNOSIS — I120 Hypertensive chronic kidney disease with stage 5 chronic kidney disease or end stage renal disease: Secondary | ICD-10-CM | POA: Diagnosis not present

## 2016-11-08 DIAGNOSIS — T82868A Thrombosis of vascular prosthetic devices, implants and grafts, initial encounter: Secondary | ICD-10-CM

## 2016-11-08 DIAGNOSIS — I998 Other disorder of circulatory system: Secondary | ICD-10-CM

## 2016-11-08 HISTORY — PX: DISTAL REVASCULARIZATION AND INTERVAL LIGATION (DRIL): SHX6669

## 2016-11-08 LAB — CBC
HCT: 34.9 % — ABNORMAL LOW (ref 35.0–47.0)
HEMOGLOBIN: 11.6 g/dL — AB (ref 12.0–16.0)
MCH: 31.1 pg (ref 26.0–34.0)
MCHC: 33.2 g/dL (ref 32.0–36.0)
MCV: 93.7 fL (ref 80.0–100.0)
PLATELETS: 189 10*3/uL (ref 150–440)
RBC: 3.72 MIL/uL — AB (ref 3.80–5.20)
RDW: 15.4 % — ABNORMAL HIGH (ref 11.5–14.5)
WBC: 9.4 10*3/uL (ref 3.6–11.0)

## 2016-11-08 LAB — POCT I-STAT 4, (NA,K, GLUC, HGB,HCT)
GLUCOSE: 106 mg/dL — AB (ref 65–99)
HCT: 38 % (ref 36.0–46.0)
Hemoglobin: 12.9 g/dL (ref 12.0–15.0)
POTASSIUM: 4.4 mmol/L (ref 3.5–5.1)
Sodium: 138 mmol/L (ref 135–145)

## 2016-11-08 LAB — GLUCOSE, CAPILLARY
GLUCOSE-CAPILLARY: 108 mg/dL — AB (ref 65–99)
Glucose-Capillary: 111 mg/dL — ABNORMAL HIGH (ref 65–99)

## 2016-11-08 LAB — CREATININE, SERUM
Creatinine, Ser: 4.18 mg/dL — ABNORMAL HIGH (ref 0.44–1.00)
GFR calc non Af Amer: 11 mL/min — ABNORMAL LOW (ref >60)
GFR, EST AFRICAN AMERICAN: 12 mL/min — AB (ref >60)

## 2016-11-08 SURGERY — DISTAL REVASCULARIZATION AND INTERVAL LIGATION PROCEDURE
Anesthesia: General | Site: Arm Upper | Laterality: Right | Wound class: Clean

## 2016-11-08 MED ORDER — HYDROMORPHONE HCL 1 MG/ML IJ SOLN
0.5000 mg | INTRAMUSCULAR | Status: DC | PRN
  Administered 2016-11-08: 0.5 mg via INTRAVENOUS

## 2016-11-08 MED ORDER — ACETAMINOPHEN 500 MG PO TABS: 1000.0000 mg | ORAL_TABLET | Freq: Four times a day (QID) | ORAL | Status: DC | PRN

## 2016-11-08 MED ORDER — ONDANSETRON HCL 4 MG/2ML IJ SOLN
4.0000 mg | Freq: Four times a day (QID) | INTRAMUSCULAR | Status: DC | PRN
  Administered 2016-11-08: 4 mg via INTRAVENOUS
  Filled 2016-11-08: qty 2

## 2016-11-08 MED ORDER — SODIUM CHLORIDE 0.9 % IJ SOLN
INTRAMUSCULAR | Status: AC
  Filled 2016-11-08: qty 100

## 2016-11-08 MED ORDER — PROPOFOL 10 MG/ML IV BOLUS
INTRAVENOUS | Status: DC | PRN
  Administered 2016-11-08: 100 mg via INTRAVENOUS

## 2016-11-08 MED ORDER — HEPARIN SODIUM (PORCINE) 5000 UNIT/ML IJ SOLN
INTRAMUSCULAR | Status: AC
  Filled 2016-11-08: qty 1

## 2016-11-08 MED ORDER — PHENYLEPHRINE HCL 10 MG/ML IJ SOLN
INTRAMUSCULAR | Status: DC | PRN
  Administered 2016-11-08: 50 ug via INTRAVENOUS
  Administered 2016-11-08: 25 ug via INTRAVENOUS

## 2016-11-08 MED ORDER — FENTANYL CITRATE (PF) 100 MCG/2ML IJ SOLN
INTRAMUSCULAR | Status: DC | PRN
  Administered 2016-11-08 (×5): 25 ug via INTRAVENOUS

## 2016-11-08 MED ORDER — ONDANSETRON HCL 4 MG PO TABS: 4.0000 mg | ORAL_TABLET | Freq: Four times a day (QID) | ORAL | Status: DC | PRN

## 2016-11-08 MED ORDER — SORBITOL 70 % SOLN
30.0000 mL | Freq: Every day | Status: DC | PRN
  Filled 2016-11-08: qty 30

## 2016-11-08 MED ORDER — ATORVASTATIN CALCIUM 20 MG PO TABS
40.0000 mg | ORAL_TABLET | Freq: Every day | ORAL | Status: DC
  Administered 2016-11-09: 40 mg via ORAL
  Filled 2016-11-08: qty 2

## 2016-11-08 MED ORDER — MAGNESIUM HYDROXIDE 400 MG/5ML PO SUSP: 30.0000 mL | Freq: Every day | ORAL | Status: DC | PRN

## 2016-11-08 MED ORDER — EPINEPHRINE PF 1 MG/ML IJ SOLN
INTRAMUSCULAR | Status: AC
  Filled 2016-11-08: qty 2

## 2016-11-08 MED ORDER — FENTANYL CITRATE (PF) 100 MCG/2ML IJ SOLN
25.0000 ug | INTRAMUSCULAR | Status: DC | PRN
  Administered 2016-11-08 (×4): 25 ug via INTRAVENOUS

## 2016-11-08 MED ORDER — HYDROXYZINE HCL 50 MG PO TABS
25.0000 mg | ORAL_TABLET | Freq: Two times a day (BID) | ORAL | Status: DC
  Administered 2016-11-08 – 2016-11-10 (×4): 25 mg via ORAL
  Filled 2016-11-08 (×4): qty 1

## 2016-11-08 MED ORDER — HEPARIN SODIUM (PORCINE) 5000 UNIT/ML IJ SOLN
5000.0000 [IU] | Freq: Three times a day (TID) | INTRAMUSCULAR | Status: DC
  Administered 2016-11-09 – 2016-11-10 (×3): 5000 [IU] via SUBCUTANEOUS
  Filled 2016-11-08 (×4): qty 1

## 2016-11-08 MED ORDER — HYDRALAZINE HCL 20 MG/ML IJ SOLN
10.0000 mg | Freq: Once | INTRAMUSCULAR | Status: AC
  Administered 2016-11-08: 10 mg via INTRAVENOUS

## 2016-11-08 MED ORDER — LOSARTAN POTASSIUM 50 MG PO TABS
50.0000 mg | ORAL_TABLET | Freq: Every day | ORAL | Status: DC
  Administered 2016-11-10: 50 mg via ORAL
  Filled 2016-11-08: qty 1

## 2016-11-08 MED ORDER — HYDROCODONE-ACETAMINOPHEN 5-325 MG PO TABS
1.0000 | ORAL_TABLET | ORAL | Status: DC | PRN
  Administered 2016-11-09: 1 via ORAL
  Filled 2016-11-08: qty 1

## 2016-11-08 MED ORDER — HEPARIN SODIUM (PORCINE) 1000 UNIT/ML IJ SOLN
INTRAMUSCULAR | Status: AC
  Filled 2016-11-08: qty 1

## 2016-11-08 MED ORDER — DOCUSATE SODIUM 100 MG PO CAPS
100.0000 mg | ORAL_CAPSULE | Freq: Two times a day (BID) | ORAL | Status: DC
  Administered 2016-11-08 – 2016-11-10 (×4): 100 mg via ORAL
  Filled 2016-11-08 (×4): qty 1

## 2016-11-08 MED ORDER — FENTANYL CITRATE (PF) 100 MCG/2ML IJ SOLN
INTRAMUSCULAR | Status: AC
  Filled 2016-11-08: qty 2

## 2016-11-08 MED ORDER — HYDROMORPHONE HCL 1 MG/ML IJ SOLN: 0.5000 mg | INTRAMUSCULAR | Status: DC | PRN

## 2016-11-08 MED ORDER — EPHEDRINE SULFATE 50 MG/ML IJ SOLN
INTRAMUSCULAR | Status: DC | PRN
  Administered 2016-11-08: 5 mg via INTRAVENOUS

## 2016-11-08 MED ORDER — MORPHINE SULFATE (PF) 4 MG/ML IV SOLN
2.0000 mg | INTRAVENOUS | Status: DC | PRN
  Filled 2016-11-08: qty 1

## 2016-11-08 MED ORDER — DIPHENHYDRAMINE HCL 25 MG PO CAPS
25.0000 mg | ORAL_CAPSULE | Freq: Once | ORAL | Status: AC
  Administered 2016-11-09: 25 mg via ORAL

## 2016-11-08 MED ORDER — BUPIVACAINE HCL (PF) 0.5 % IJ SOLN
INTRAMUSCULAR | Status: AC
  Filled 2016-11-08: qty 30

## 2016-11-08 MED ORDER — BUPIVACAINE-EPINEPHRINE (PF) 0.5% -1:200000 IJ SOLN
INTRAMUSCULAR | Status: AC
  Filled 2016-11-08: qty 30

## 2016-11-08 MED ORDER — DEXTROSE-NACL 5-0.9 % IV SOLN
INTRAVENOUS | Status: DC
  Administered 2016-11-09: via INTRAVENOUS

## 2016-11-08 MED ORDER — ASPIRIN EC 81 MG PO TBEC: 81.0000 mg | DELAYED_RELEASE_TABLET | Freq: Every day | ORAL | 2 refills | Status: DC

## 2016-11-08 MED ORDER — ONDANSETRON HCL 4 MG/2ML IJ SOLN
4.0000 mg | Freq: Once | INTRAMUSCULAR | Status: AC | PRN
  Administered 2016-11-08: 4 mg via INTRAVENOUS

## 2016-11-08 MED ORDER — SODIUM CHLORIDE 0.9% FLUSH
3.0000 mL | Freq: Two times a day (BID) | INTRAVENOUS | Status: DC
  Administered 2016-11-09 – 2016-11-10 (×3): 3 mL via INTRAVENOUS

## 2016-11-08 MED ORDER — LIDOCAINE HCL (PF) 1 % IJ SOLN
INTRAMUSCULAR | Status: AC
  Filled 2016-11-08: qty 30

## 2016-11-08 MED ORDER — FLEET ENEMA 7-19 GM/118ML RE ENEM: 1.0000 | ENEMA | Freq: Once | RECTAL | Status: DC | PRN

## 2016-11-08 MED ORDER — HEPARIN SODIUM (PORCINE) 1000 UNIT/ML IJ SOLN
INTRAMUSCULAR | Status: DC | PRN
  Administered 2016-11-08: 3 mL via INTRAVENOUS

## 2016-11-08 MED ORDER — ACETAMINOPHEN 650 MG RE SUPP: 650.0000 mg | Freq: Four times a day (QID) | RECTAL | Status: DC | PRN

## 2016-11-08 MED ORDER — SODIUM CHLORIDE 0.9 % IJ SOLN
INTRAMUSCULAR | Status: AC
  Filled 2016-11-08: qty 20

## 2016-11-08 MED ORDER — OXYCODONE-ACETAMINOPHEN 7.5-325 MG PO TABS: 1.0000 | ORAL_TABLET | ORAL | 0 refills | Status: DC | PRN

## 2016-11-08 MED ORDER — MORPHINE SULFATE (PF) 4 MG/ML IV SOLN
4.0000 mg | Freq: Once | INTRAVENOUS | Status: AC
  Administered 2016-11-08: 4 mg via INTRAVENOUS

## 2016-11-08 MED ORDER — ONDANSETRON HCL 4 MG/2ML IJ SOLN
INTRAMUSCULAR | Status: DC | PRN
  Administered 2016-11-08: 4 mg via INTRAVENOUS

## 2016-11-08 MED ORDER — PAPAVERINE HCL 30 MG/ML IJ SOLN
INTRAMUSCULAR | Status: AC
  Filled 2016-11-08: qty 2

## 2016-11-08 MED ORDER — HYDRALAZINE HCL 20 MG/ML IJ SOLN
INTRAMUSCULAR | Status: AC
  Filled 2016-11-08: qty 1

## 2016-11-08 MED ORDER — ASPIRIN EC 81 MG PO TBEC
81.0000 mg | DELAYED_RELEASE_TABLET | Freq: Every day | ORAL | Status: DC
  Administered 2016-11-09 – 2016-11-10 (×2): 81 mg via ORAL
  Filled 2016-11-08 (×2): qty 1

## 2016-11-08 MED ORDER — METOPROLOL TARTRATE 25 MG PO TABS
25.0000 mg | ORAL_TABLET | Freq: Two times a day (BID) | ORAL | Status: DC
  Administered 2016-11-08 – 2016-11-09 (×2): 25 mg via ORAL
  Filled 2016-11-08 (×2): qty 1

## 2016-11-08 MED ORDER — HYDRALAZINE HCL 25 MG PO TABS
25.0000 mg | ORAL_TABLET | Freq: Three times a day (TID) | ORAL | Status: DC
  Administered 2016-11-08 – 2016-11-10 (×2): 25 mg via ORAL
  Filled 2016-11-08 (×2): qty 1

## 2016-11-08 MED ORDER — INSULIN ASPART PROT & ASPART (70-30 MIX) 100 UNIT/ML ~~LOC~~ SUSP
5.0000 [IU] | Freq: Two times a day (BID) | SUBCUTANEOUS | Status: DC
  Administered 2016-11-09 – 2016-11-10 (×3): 5 [IU] via SUBCUTANEOUS
  Filled 2016-11-08 (×3): qty 5

## 2016-11-08 MED ORDER — AMLODIPINE BESYLATE 10 MG PO TABS
10.0000 mg | ORAL_TABLET | Freq: Every day | ORAL | Status: DC
  Administered 2016-11-09: 10 mg via ORAL
  Filled 2016-11-08: qty 1

## 2016-11-08 MED ORDER — HYDROMORPHONE HCL 1 MG/ML IJ SOLN
INTRAMUSCULAR | Status: AC
  Filled 2016-11-08: qty 1

## 2016-11-08 MED ORDER — MIDAZOLAM HCL 2 MG/2ML IJ SOLN
INTRAMUSCULAR | Status: DC | PRN
  Administered 2016-11-08: 2 mg via INTRAVENOUS

## 2016-11-08 MED ORDER — LIDOCAINE HCL (CARDIAC) 20 MG/ML IV SOLN
INTRAVENOUS | Status: DC | PRN
  Administered 2016-11-08: 40 mg via INTRAVENOUS

## 2016-11-08 MED ORDER — ONDANSETRON HCL 4 MG/2ML IJ SOLN
INTRAMUSCULAR | Status: AC
  Administered 2016-11-08: 4 mg via INTRAVENOUS
  Filled 2016-11-08: qty 2

## 2016-11-08 MED ORDER — NEPRO/CARBSTEADY PO LIQD
237.0000 mL | Freq: Two times a day (BID) | ORAL | Status: DC
  Administered 2016-11-09: 237 mL via ORAL

## 2016-11-08 MED ORDER — ACETAMINOPHEN 325 MG PO TABS: 650.0000 mg | ORAL_TABLET | Freq: Four times a day (QID) | ORAL | Status: DC | PRN

## 2016-11-08 MED ORDER — CALCIUM ACETATE (PHOS BINDER) 667 MG PO CAPS
667.0000 mg | ORAL_CAPSULE | Freq: Three times a day (TID) | ORAL | Status: DC
  Administered 2016-11-09 – 2016-11-10 (×4): 667 mg via ORAL
  Filled 2016-11-08 (×4): qty 1

## 2016-11-08 SURGICAL SUPPLY — 60 items
BAG COUNTER SPONGE EZ (MISCELLANEOUS) ×2 IMPLANT
BAG DECANTER FOR FLEXI CONT (MISCELLANEOUS) ×3 IMPLANT
BLADE SURG SZ11 CARB STEEL (BLADE) ×3 IMPLANT
BOOT SUTURE AID YELLOW STND (SUTURE) ×3 IMPLANT
BRUSH SCRUB 4% CHG (MISCELLANEOUS) ×6 IMPLANT
CANISTER SUCT 1200ML W/VALVE (MISCELLANEOUS) ×3 IMPLANT
CHLORAPREP W/TINT 26ML (MISCELLANEOUS) ×6 IMPLANT
CLIP SPRNG 6MM S-JAW DBL (CLIP) ×3
COUNTER SPONGE BAG EZ (MISCELLANEOUS) ×1
DERMABOND ADVANCED (GAUZE/BANDAGES/DRESSINGS) ×2
DERMABOND ADVANCED .7 DNX12 (GAUZE/BANDAGES/DRESSINGS) ×1 IMPLANT
DRAPE INCISE IOBAN 66X45 STRL (DRAPES) ×3 IMPLANT
DRSG TEGADERM 4X10 (GAUZE/BANDAGES/DRESSINGS) IMPLANT
DRSG TELFA 3X8 NADH (GAUZE/BANDAGES/DRESSINGS) IMPLANT
ELECT CAUTERY BLADE 6.4 (BLADE) ×3 IMPLANT
ELECT REM PT RETURN 9FT ADLT (ELECTROSURGICAL) ×3
ELECTRODE REM PT RTRN 9FT ADLT (ELECTROSURGICAL) ×1 IMPLANT
GEL ULTRASOUND 20GR AQUASONIC (MISCELLANEOUS) IMPLANT
GLOVE BIO SURGEON STRL SZ7 (GLOVE) ×6 IMPLANT
GLOVE INDICATOR 7.5 STRL GRN (GLOVE) ×6 IMPLANT
GOWN STRL REUS W/ TWL LRG LVL3 (GOWN DISPOSABLE) ×2 IMPLANT
GOWN STRL REUS W/ TWL XL LVL3 (GOWN DISPOSABLE) ×1 IMPLANT
GOWN STRL REUS W/TWL LRG LVL3 (GOWN DISPOSABLE) ×4
GOWN STRL REUS W/TWL XL LVL3 (GOWN DISPOSABLE) ×2
HEMOSTAT SURGICEL 2X3 (HEMOSTASIS) ×3 IMPLANT
IV NS 500ML (IV SOLUTION) ×2
IV NS 500ML BAXH (IV SOLUTION) ×1 IMPLANT
KIT RM TURNOVER STRD PROC AR (KITS) ×3 IMPLANT
LABEL OR SOLS (LABEL) ×3 IMPLANT
LOOP RED MAXI  1X406MM (MISCELLANEOUS) ×4
LOOP VESSEL MAXI 1X406 RED (MISCELLANEOUS) ×2 IMPLANT
LOOP VESSEL MINI 0.8X406 BLUE (MISCELLANEOUS) ×1 IMPLANT
LOOPS BLUE MINI 0.8X406MM (MISCELLANEOUS) ×2
NEEDLE FILTER BLUNT 18X 1/2SAF (NEEDLE) ×2
NEEDLE FILTER BLUNT 18X1 1/2 (NEEDLE) ×1 IMPLANT
NS IRRIG 500ML POUR BTL (IV SOLUTION) ×3 IMPLANT
PACK EXTREMITY ARMC (MISCELLANEOUS) ×3 IMPLANT
PACK UNIVERSAL (MISCELLANEOUS) ×3 IMPLANT
PAD PREP 24X41 OB/GYN DISP (PERSONAL CARE ITEMS) ×3 IMPLANT
SOLUTION CELL SAVER (CLIP) ×1 IMPLANT
SPONGE LAP 18X18 5 PK (GAUZE/BANDAGES/DRESSINGS) IMPLANT
STAPLER SKIN PROX 35W (STAPLE) IMPLANT
STOCKINETTE STRL 4IN 9604848 (GAUZE/BANDAGES/DRESSINGS) ×3 IMPLANT
SUT MNCRL AB 4-0 PS2 18 (SUTURE) ×6 IMPLANT
SUT PROLENE 6 0 BV (SUTURE) ×12 IMPLANT
SUT SILK 2 0 (SUTURE) ×2
SUT SILK 2 0 SH (SUTURE) ×3 IMPLANT
SUT SILK 2-0 18XBRD TIE 12 (SUTURE) ×1 IMPLANT
SUT SILK 3 0 (SUTURE) ×2
SUT SILK 3-0 18XBRD TIE 12 (SUTURE) ×1 IMPLANT
SUT SILK 4 0 (SUTURE) ×2
SUT SILK 4-0 18XBRD TIE 12 (SUTURE) ×1 IMPLANT
SUT VIC AB 2-0 CT1 (SUTURE) IMPLANT
SUT VIC AB 3-0 SH 27 (SUTURE) ×4
SUT VIC AB 3-0 SH 27X BRD (SUTURE) ×2 IMPLANT
SUT VICRYL+ 3-0 36IN CT-1 (SUTURE) IMPLANT
SYR 20CC LL (SYRINGE) ×3 IMPLANT
SYR 3ML LL SCALE MARK (SYRINGE) ×3 IMPLANT
SYR 50ML LL SCALE MARK (SYRINGE) IMPLANT
TOWEL OR 17X26 4PK STRL BLUE (TOWEL DISPOSABLE) IMPLANT

## 2016-11-08 NOTE — Anesthesia Procedure Notes (Signed)
Procedure Name: LMA Insertion Date/Time: 11/08/2016 1:43 PM Performed by: Hedda Slade Pre-anesthesia Checklist: Patient identified, Emergency Drugs available, Suction available and Patient being monitored Patient Re-evaluated:Patient Re-evaluated prior to inductionOxygen Delivery Method: Circle system utilized Preoxygenation: Pre-oxygenation with 100% oxygen Intubation Type: IV induction Ventilation: Mask ventilation without difficulty LMA: LMA inserted LMA Size: 3.0 Number of attempts: 1 Placement Confirmation: positive ETCO2 and breath sounds checked- equal and bilateral Tube secured with: Tape Dental Injury: Teeth and Oropharynx as per pre-operative assessment

## 2016-11-08 NOTE — OR Nursing (Signed)
Pt was instructed to take 4 pills this morning, one of which was Metoprolol, says she took two pills this morning but doesn't know which ones.

## 2016-11-08 NOTE — Anesthesia Preprocedure Evaluation (Signed)
Anesthesia Evaluation  Patient identified by MRN, date of birth, ID band Patient awake    Reviewed: Allergy & Precautions, H&P , NPO status , Patient's Chart, lab work & pertinent test results, reviewed documented beta blocker date and time   Airway Mallampati: III  TM Distance: >3 FB Neck ROM: full    Dental  (+) Poor Dentition   Pulmonary neg pulmonary ROS,    Pulmonary exam normal        Cardiovascular Exercise Tolerance: Good hypertension, negative cardio ROS Normal cardiovascular exam Rate:Normal     Neuro/Psych  Headaches, CVA, Residual Symptoms negative neurological ROS  negative psych ROS   GI/Hepatic negative GI ROS, Neg liver ROS,   Endo/Other  negative endocrine ROSdiabetes  Renal/GU CRFRenal diseasenegative Renal ROS  negative genitourinary   Musculoskeletal   Abdominal   Peds  Hematology negative hematology ROS (+) anemia ,   Anesthesia Other Findings   Reproductive/Obstetrics negative OB ROS                             Anesthesia Physical Anesthesia Plan  ASA: III  Anesthesia Plan: General LMA   Post-op Pain Management:    Induction:   Airway Management Planned:   Additional Equipment:   Intra-op Plan:   Post-operative Plan:   Informed Consent: I have reviewed the patients History and Physical, chart, labs and discussed the procedure including the risks, benefits and alternatives for the proposed anesthesia with the patient or authorized representative who has indicated his/her understanding and acceptance.     Plan Discussed with: CRNA  Anesthesia Plan Comments:         Anesthesia Quick Evaluation

## 2016-11-08 NOTE — Anesthesia Postprocedure Evaluation (Signed)
Anesthesia Post Note  Patient: Anita Contreras  Procedure(s) Performed: Procedure(s) (LRB): DISTAL REVASCULARIZATION AND INTERVAL LIGATION (DRIL) (Right)  Patient location during evaluation: PACU Anesthesia Type: General Level of consciousness: awake and alert Pain management: pain level controlled Vital Signs Assessment: post-procedure vital signs reviewed and stable Respiratory status: spontaneous breathing, nonlabored ventilation, respiratory function stable and patient connected to nasal cannula oxygen Cardiovascular status: blood pressure returned to baseline and stable Postop Assessment: no signs of nausea or vomiting Anesthetic complications: no    Last Vitals:  Vitals:   11/08/16 1742 11/08/16 1858  BP: (!) 181/55 (!) 136/43  Pulse: 70 69  Resp:  16  Temp:  36.4 C    Last Pain:  Vitals:   11/08/16 1858  TempSrc: Oral  PainSc:                  Precious Haws Jiyan Walkowski

## 2016-11-08 NOTE — Op Note (Signed)
[facility]   OPERATIVE NOTE  DATE: 11/08/2016  PRE-OPERATIVE DIAGNOSIS: Steal syndrome right arm with patent right brachiocephalic AV fistula, end-stage renal disease  POST-OPERATIVE DIAGNOSIS: Same as above  PROCEDURE: 1.   Left great saphenous vein harvest for bypass 2.   Distal revascularization interval ligation of right brachial artery to right radial artery with reverse saphenous vein graft and ligation of brachial artery between the distal bypass anastomosis and the AV fistula  SURGEON: [name]  ASSISTANT(S): Hezzie Bump, PA-C  ANESTHESIA: Gen.  ESTIMATED BLOOD LOSS: 25 cc  FINDING(S): 1.  Adequate left great saphenous vein for bypass  SPECIMEN(S):  None  INDICATIONS:   Anita Contreras is a 60 y.o. female who presents with steal syndrome of her right arm despite percutaneous revascularization of the distal vessels. She needs her AV fistula for permanent dialysis access. We're trying to salvage her fistula and improve her perfusion to her arm. Risks and benefits are discussed and informed consent was obtained.  DESCRIPTION: After obtaining full informed written consent, the patient was brought back to the operating room and placed supine upon the operating table.  The patient received IV antibiotics prior to induction.  After obtaining adequate anesthesia, the patient was prepped and draped in the standard fashion. An incision was made in the mid upper arm and the brachial artery was dissected out and encircled with vessel loops proximally and distally. Incision was then created just below the antecubital fossa and the brachial bifurcation including the radial artery and ulnar arteries were dissected out and prepared for distal bypass with vessel loops placed proximally and distally. A tunnel was created between the 2 with a long Kelly clamp. I then turned my attention to the left thigh. Ultrasound was used to help map out the location of the  saphenous vein which appeared adequate for bypass. The vein was then dissected out. Multiple venous branches were ligated and divided between silk ties and approximately 18-20 cm of the vein were dissected out to the mid thigh. This was taken up near the saphenofemoral junction. The vein was marked for orientation. It was then ligated distally and opened. It flushed easily with heparinized saline. It was then ligated near the saphenofemoral junction with a 2-0 silk tie. The patient was then systemically heparinized with 3000 units of intravenous heparin. The vein was brought through the tunnel the long Kelly clamp making sure to have the correct orientation for a first saphenous vein graft. The proximal anastomosis was created first. Control was pulled up on the Vesseloops proximally and distally and an anterior wall arteriotomy was created with an 11 blade and extended with Potts scissors. The saphenous vein was then cut and beveled to match the arteriotomy area and anastomosis created with running 6-0 Prolene suture in the usual fashion. The vessel was released on control and we flushed through the bypass showed brisk arterial inflow. The vein graft was then clamped. 6-0 Prolene patch sutures were used for hemostasis. I then turned my attention to the distal anastomosis. The orientation required a bypass to the most proximal radial artery at the brachial bifurcation. The ulnar artery traversed deep. Control was pulled up on the vessel loops and an anterior wall arteriotomy was created on the proximal radial artery. The vein was then cut and beveled to an appropriate length to match the arteriotomy. An anastomosis was created with a running 6-0 Prolene suture in the usual fashion. The vessel was flushed and de-aired prior to release of  control and on release brisk arterial inflow was seen. For some vasospasm with her for small vessels topical papaverine was used. I then used a heavy silk tied to ligate the  brachial artery between the new distal bypass anastomosis and the previous AV fistula anastomosis. 6-0 Prolene patch sutures used on the distal bypass anastomosis as needed for hemostasis and hemostasis was complete. Continuous-wave Doppler was brought onto the field. A thrill was easily palpable within the AV fistula and good flow was heard on continuous-wave Doppler. Biphasic Doppler signals were heard in the radial artery and weaker Doppler signals were heard in the ulnar artery at the wrist. An easily palpable pulse was felt in the bypass graft as well. At this point the wounds were irrigated. The saphenous vein harvest incision was closed with 2 layers of 3-0 Vicryl and the skin was coapted with staples. The arm incisions were closed with 3-0 Vicryl and 4-0 Monocryl and Dermabond was placed as a dressing. Dry dressing was placed over the saphenous vein harvest site. The patient was then awakened from anesthesia and taken to the recovery room in stable condition having tolerated the procedure well.  COMPLICATIONS: None  CONDITION: Stable  [name]  11/08/2016, 4:04 PM    This note was created with Dragon Medical transcription system. Any errors in dictation are purely unintentional.

## 2016-11-08 NOTE — OR Nursing (Signed)
Dr. Amie Critchley contacted about patient's increased blood pressure.  Order for 10 mg Hydralazine to be given.

## 2016-11-08 NOTE — H&P (Signed)
[facility] History & Physical Update  The patient was interviewed and re-examined.  The patient's previous History and Physical has been reviewed and is unchanged.  There is no change in the plan of care. We plan to proceed with the scheduled procedure.  [name], MD  11/08/2016, 12:49 PM

## 2016-11-08 NOTE — Transfer of Care (Signed)
Immediate Anesthesia Transfer of Care Note  Patient: Anita Contreras  Procedure(s) Performed: Procedure(s): DISTAL REVASCULARIZATION AND INTERVAL LIGATION (DRIL) (Right)  Patient Location: PACU  Anesthesia Type:General  Level of Consciousness: sedated  Airway & Oxygen Therapy: Patient Spontanous Breathing and Patient connected to face mask oxygen  Post-op Assessment: Report given to RN and Post -op Vital signs reviewed and stable  Post vital signs: Reviewed and stable  Last Vitals:  Vitals:   11/08/16 1225 11/08/16 1618  BP: (!) 186/60 (!) 172/64  Pulse: 65 63  Resp: 16 13  Temp: 36.7 C 36.2 C    Last Pain: There were no vitals filed for this visit.       Complications: No apparent anesthesia complications

## 2016-11-08 NOTE — Progress Notes (Signed)
Called  Dr. Jannifer Franklin regarding medication to relieve itching per patient request.  Appropriate orders were placed.  Christene Slates  11/08/2016 11:33 PM

## 2016-11-09 DIAGNOSIS — T82898A Other specified complication of vascular prosthetic devices, implants and grafts, initial encounter: Secondary | ICD-10-CM | POA: Diagnosis not present

## 2016-11-09 LAB — BASIC METABOLIC PANEL
Anion gap: 10 (ref 5–15)
BUN: 38 mg/dL — AB (ref 6–20)
CO2: 25 mmol/L (ref 22–32)
CREATININE: 4.88 mg/dL — AB (ref 0.44–1.00)
Calcium: 7.8 mg/dL — ABNORMAL LOW (ref 8.9–10.3)
Chloride: 103 mmol/L (ref 101–111)
GFR calc Af Amer: 10 mL/min — ABNORMAL LOW (ref >60)
GFR, EST NON AFRICAN AMERICAN: 9 mL/min — AB (ref >60)
Glucose, Bld: 132 mg/dL — ABNORMAL HIGH (ref 65–99)
POTASSIUM: 5.4 mmol/L — AB (ref 3.5–5.1)
SODIUM: 138 mmol/L (ref 135–145)

## 2016-11-09 LAB — CBC
HEMATOCRIT: 33.1 % — AB (ref 35.0–47.0)
Hemoglobin: 10.9 g/dL — ABNORMAL LOW (ref 12.0–16.0)
MCH: 30.6 pg (ref 26.0–34.0)
MCHC: 33 g/dL (ref 32.0–36.0)
MCV: 92.8 fL (ref 80.0–100.0)
PLATELETS: 183 10*3/uL (ref 150–440)
RBC: 3.57 MIL/uL — ABNORMAL LOW (ref 3.80–5.20)
RDW: 15.5 % — AB (ref 11.5–14.5)
WBC: 7.5 10*3/uL (ref 3.6–11.0)

## 2016-11-09 LAB — PHOSPHORUS: Phosphorus: 5.7 mg/dL — ABNORMAL HIGH (ref 2.5–4.6)

## 2016-11-09 MED ORDER — DIPHENHYDRAMINE HCL 25 MG PO CAPS
ORAL_CAPSULE | ORAL | Status: AC
  Administered 2016-11-09: 01:00:00
  Filled 2016-11-09: qty 1

## 2016-11-09 MED ORDER — HEPARIN SODIUM (PORCINE) 1000 UNIT/ML DIALYSIS
1000.0000 [IU] | INTRAMUSCULAR | Status: DC | PRN
  Filled 2016-11-09: qty 1

## 2016-11-09 MED ORDER — OXYCODONE-ACETAMINOPHEN 5-325 MG PO TABS
1.0000 | ORAL_TABLET | ORAL | Status: DC | PRN
  Administered 2016-11-09: 1 via ORAL
  Filled 2016-11-09: qty 1

## 2016-11-09 MED ORDER — SODIUM CHLORIDE 0.9 % IV SOLN: 100.0000 mL | INTRAVENOUS | Status: DC | PRN

## 2016-11-09 MED ORDER — LIDOCAINE HCL (PF) 1 % IJ SOLN
5.0000 mL | INTRAMUSCULAR | Status: DC | PRN
  Filled 2016-11-09: qty 5

## 2016-11-09 MED ORDER — LIDOCAINE-PRILOCAINE 2.5-2.5 % EX CREA
1.0000 "application " | TOPICAL_CREAM | CUTANEOUS | Status: DC | PRN
  Filled 2016-11-09: qty 5

## 2016-11-09 MED ORDER — MORPHINE SULFATE (PF) 4 MG/ML IV SOLN: 2.0000 mg | INTRAVENOUS | Status: DC | PRN

## 2016-11-09 MED ORDER — ALTEPLASE 2 MG IJ SOLR: 2.0000 mg | Freq: Once | INTRAMUSCULAR | Status: DC | PRN

## 2016-11-09 MED ORDER — PENTAFLUOROPROP-TETRAFLUOROETH EX AERO
1.0000 "application " | INHALATION_SPRAY | CUTANEOUS | Status: DC | PRN
  Filled 2016-11-09: qty 30

## 2016-11-09 NOTE — Progress Notes (Signed)
Pre hd assessment  

## 2016-11-09 NOTE — Progress Notes (Signed)
[facility]  Daily Progress Note   Subjective: 1 Day Post-Op: Left great saphenous vein harvest for bypass, Distal revascularization interval ligation of right brachial artery to right radial artery with reverse saphenous vein graft and ligation of brachial artery between the distal bypass anastomosis and the AV fistula  Patient seen and examined in HD. Having hand pain. No issues with permcath.   Objective: Vitals:   11/09/16 1445 11/09/16 1515 11/09/16 1545 11/09/16 1600  BP: (!) 155/55 (!) 159/55 (!) 151/54 (!) 146/52  Pulse: 61 61 61 60  Resp: 16 18 16 19   Temp:      TempSrc:      SpO2: 97% 98% 97% 99%  Weight:        Intake/Output Summary (Last 24 hours) at 11/09/16 1624 Last data filed at 11/09/16 0942  Gross per 24 hour  Intake              543 ml  Output                0 ml  Net              543 ml   Physical Exam: A&Ox3, NAD Neck: Permcath intact, working appropriately. No signs of infection noted. CV: RRR Pulmonary: CTA Bilaterally Abdomen: Soft, Nontender, Nondistended Vascular:  Right Upper Extremity: Incision clean and dry. Hand Warm with mild erythematous. Good thrill and bruit.  Left Lower Extremity: OR dressing in place. Clean and dry. Thigh soft.    Laboratory: CBC    Component Value Date/Time   WBC 7.5 11/09/2016 0541   HGB 10.9 (L) 11/09/2016 0541   HCT 33.1 (L) 11/09/2016 0541   PLT 183 11/09/2016 0541   BMET    Component Value Date/Time   NA 138 11/09/2016 0541   K 5.4 (H) 11/09/2016 0541   CL 103 11/09/2016 0541   CO2 25 11/09/2016 0541   GLUCOSE 132 (H) 11/09/2016 0541   BUN 38 (H) 11/09/2016 0541   CREATININE 4.88 (H) 11/09/2016 0541   CALCIUM 7.8 (L) 11/09/2016 0541   GFRNONAA 9 (L) 11/09/2016 0541   GFRAA 10 (L) 11/09/2016 0541   Assessment/Planning: 60 year old female with right hand steal syndrome s/p DRIL procedure - doing well. 1) Patient needed dialysis today as her next treatment would have been  Tuesday. 2) Will keep overnight for pain control due to reprofusion syndrome. 3) Plan is for dispo home tomorrow.  Marcelle Overlie PA-C 11/09/2016 4:24 PM

## 2016-11-09 NOTE — Progress Notes (Signed)
Post dialysis 

## 2016-11-09 NOTE — Progress Notes (Signed)
Pre hd info 

## 2016-11-09 NOTE — Progress Notes (Signed)
Dialysis complete

## 2016-11-09 NOTE — Progress Notes (Signed)
Start of hd 

## 2016-11-09 NOTE — Care Management (Signed)
Anita Contreras is feeling a little better, her grand daughter translated that she had taken something for pain and was better. I shared with her that we offer several services (Advance Directives, Counseling, Prayer and et Ronney Asters). As her grand daughter translated she understood but did not desire anything at the moment.

## 2016-11-09 NOTE — Progress Notes (Signed)
CSW received consult due to pt being having difficult obtaining medications. CSW informed RNCM. CSW is signing off but is available if a need were to arise.  Ernest Pine, MSW, LCSW, [place] Clinical Social Worker [phone]

## 2016-11-09 NOTE — Progress Notes (Signed)
[facility]  ROUNDING NOTE   Subjective:  Patient seen and evaluated at bedside. Spanish interpreter present. Patient has history of steal syndrome and underwent Distal revascularization of the right brachial artery to right radial artery with reverse saphenous graft. She is due for hemodialysis today. She has a right internal jugular PermCath in place.   Objective:  Vital signs in last 24 hours:  Temp:  [97.1 F (36.2 C)-98.6 F (37 C)] 98.6 F (37 C) (12/02 0558) Pulse Rate:  [62-80] 80 (12/02 1146) Resp:  [13-16] 16 (12/02 0558) BP: (118-196)/(39-75) 120/50 (12/02 1146) SpO2:  [93 %-100 %] 98 % (12/02 0558) Weight:  [53 kg (116 lb 13.5 oz)] 53 kg (116 lb 13.5 oz) (12/02 0500)  Weight change:  Filed Weights   11/09/16 0500  Weight: 53 kg (116 lb 13.5 oz)    Intake/Output: I/O last 3 completed shifts: In: 703 [I.V.:703] Out: 50 [Blood:50]   Intake/Output this shift:  Total I/O In: 240 [P.O.:240] Out: -   Physical Exam: General: No acute distress  Head: Normocephalic, atraumatic. Moist oral mucosal membranes  Eyes: Anicteric  Neck: Supple, trachea midline  Lungs:  Clear to auscultation, normal effort  Heart: S1S2 no rubs  Abdomen:  Soft, nontender,   Extremities:  peripheral edema.  Neurologic: Nonfocal, moving all four extremities  Skin: No lesions  Access: R IJ permcath, RUE AVF    Basic Metabolic Panel:  Recent Labs Lab 11/08/16 1253 11/08/16 1852 11/09/16 0541  NA 138  --  138  K 4.4  --  5.4*  CL  --   --  103  CO2  --   --  25  GLUCOSE 106*  --  132*  BUN  --   --  38*  CREATININE  --  4.18* 4.88*  CALCIUM  --   --  7.8*    Liver Function Tests: No results for input(s): AST, ALT, ALKPHOS, BILITOT, PROT, ALBUMIN in the last 168 hours. No results for input(s): LIPASE, AMYLASE in the last 168 hours. No results for input(s): AMMONIA in the last 168 hours.  CBC:  Recent Labs Lab 11/08/16 1253 11/08/16 1852 11/09/16 0541   WBC  --  9.4 7.5  HGB 12.9 11.6* 10.9*  HCT 38.0 34.9* 33.1*  MCV  --  93.7 92.8  PLT  --  189 183    Cardiac Enzymes: No results for input(s): CKTOTAL, CKMB, CKMBINDEX, TROPONINI in the last 168 hours.  BNP: Invalid input(s): POCBNP  CBG:  Recent Labs Lab 11/08/16 1215 11/08/16 1623  GLUCAP 108* 111*    Microbiology: Results for orders placed or performed during the hospital encounter of 10/28/16  Surgical pcr screen     Status: None   Collection Time: 10/28/16  3:57 PM  Result Value Ref Range Status   MRSA, PCR NEGATIVE NEGATIVE Final   Staphylococcus aureus NEGATIVE NEGATIVE Final    Comment:        The Xpert SA Assay (FDA approved for NASAL specimens in patients over 72 years of age), is one component of a comprehensive surveillance program.  Test performance has been validated by [facility] for patients greater than or equal to 34 year old. It is not intended to diagnose infection nor to guide or monitor treatment.     Coagulation Studies: No results for input(s): LABPROT, INR in the last 72 hours.  Urinalysis: No results for input(s): COLORURINE, LABSPEC, PHURINE, GLUCOSEU, HGBUR, BILIRUBINUR, KETONESUR, PROTEINUR, UROBILINOGEN, NITRITE, LEUKOCYTESUR in the last 72  hours.  Invalid input(s): APPERANCEUR    Imaging: No results found.   Medications:   . dextrose 5 % and 0.9% NaCl 10 mL/hr at 11/09/16 0012   . amLODipine  10 mg Oral Daily  . aspirin EC  81 mg Oral Daily  . atorvastatin  40 mg Oral q1800  . calcium acetate  667 mg Oral TID WC  . docusate sodium  100 mg Oral BID  . feeding supplement (NEPRO CARB STEADY)  237 mL Oral BID BM  . heparin  5,000 Units Subcutaneous Q8H  . hydrALAZINE  25 mg Oral TID  . hydrOXYzine  25 mg Oral BID  . insulin aspart protamine- aspart  5 Units Subcutaneous BID WC  . losartan  50 mg Oral Daily  . metoprolol tartrate  25 mg Oral BID  . sodium chloride flush  3 mL Intravenous Q12H   acetaminophen  **OR** acetaminophen, acetaminophen, HYDROcodone-acetaminophen, magnesium hydroxide, morphine injection, ondansetron **OR** ondansetron (ZOFRAN) IV, sodium phosphate, sorbitol  Assessment/ Plan:  60 y.o. 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  Admitted for steal syndrome s/p SVG to right brachial to radial artery bypass.   [facility]  1. ESRD on HD TTS: Patient due for hemodialysis today. Orders Hospital doctor. Use right internal jugular PermCath today.  2. Anemia of chronic kidney disease. Hemoglobin has drifted down to 10.9. Hold off on ESA's for now.  3. Secondary hyperparathyroidism. Check intact PTH and phosphorus with dialysis today.  4. Hypertension.  Continue amlodipine, hydralazine, losartan, and metoprolol. Blood pressure under good control.  5. Steal syndrome. As per vascular surgery.   LOS: 0 Kaithlyn Teagle 12/2/20171:41 PM

## 2016-11-10 DIAGNOSIS — N186 End stage renal disease: Secondary | ICD-10-CM

## 2016-11-10 DIAGNOSIS — T82898A Other specified complication of vascular prosthetic devices, implants and grafts, initial encounter: Secondary | ICD-10-CM | POA: Diagnosis not present

## 2016-11-10 LAB — CBC
HEMATOCRIT: 33.9 % — AB (ref 35.0–47.0)
HEMOGLOBIN: 11.3 g/dL — AB (ref 12.0–16.0)
MCH: 31.8 pg (ref 26.0–34.0)
MCHC: 33.4 g/dL (ref 32.0–36.0)
MCV: 95.1 fL (ref 80.0–100.0)
Platelets: 192 10*3/uL (ref 150–440)
RBC: 3.56 MIL/uL — AB (ref 3.80–5.20)
RDW: 15.6 % — ABNORMAL HIGH (ref 11.5–14.5)
WBC: 6.9 10*3/uL (ref 3.6–11.0)

## 2016-11-10 LAB — PARATHYROID HORMONE, INTACT (NO CA): PTH: 183 pg/mL — AB (ref 15–65)

## 2016-11-10 MED ORDER — OXYCODONE-ACETAMINOPHEN 7.5-325 MG PO TABS: ORAL_TABLET | ORAL | 0 refills | Status: DC

## 2016-11-10 NOTE — Discharge Summary (Signed)
[facility]    Discharge Summary  Patient ID:  Anita Contreras MRN: [id number] DOB/AGE: 60-Mar-1957 60 y.o.  Admit date: 11/08/2016 Discharge date: 11/10/2016 Date of Surgery: 11/08/2016 Surgeon: Surgeon(s): Algernon Huxley, MD  Admission Diagnosis: STEAL SYNDROME  Discharge Diagnoses:  STEAL SYNDROME  Secondary Diagnoses: Past Medical History:  Diagnosis Date  . Acute cystitis with hematuria   . Anemia   . Cataracts, bilateral   . CKD (chronic kidney disease)   . Diabetic retinopathy, background ([place])   . Dialysis patient ([place])    Tues, Thurs, Sat  . Dizziness   . Hyperlipidemia   . Hypertension   . Migraine variant   . Neuropathy ([place])   . OP (osteoporosis)   . Personal history of noncompliance with medical treatment, presenting hazards to health   . Steal syndrome as complication of dialysis access ([place])   . Stroke ([place])   . Type II diabetes mellitus with ophthalmic manifestations, uncontrolled ([place])   . Vitamin D deficiency    Procedure(s): DISTAL REVASCULARIZATION AND INTERVAL LIGATION (DRIL)  Discharged Condition: good  HPI:  Anita Contreras is a 60 y.o. female who presents with steal syndrome of her right arm despite percutaneous revascularization of the distal vessels. She needs her AV fistula for permanent dialysis access. We're trying to salvage her fistula and improve her perfusion to her arm. On 11/08/16, the patient underwent a feft great saphenous vein harvest for bypass, distal revascularization interval ligation of right brachial artery to right radial artery with reverse saphenous vein graft and ligation of brachial artery between the distal bypass anastomosis and the AV fistula. The patient tolerated the procedure well and was transferred from the PACU to the surgical floor without issue. Her night of surgery was unremarkable. POD#1 the patient underwent HD through her permcath without issue. On POD#2, the patient was  tolerating a regular diet, her pain was controlled with PO medication and she was ambulating independently.   Hospital Course:  [name] is a 60 y.o. female is S/P Right  Procedure(s): DISTAL REVASCULARIZATION AND INTERVAL LIGATION (DRIL)  Extubated: POD # 0   Physical exam:  A&Ox3, NAD Neck: Permcath intact, working appropriately. No signs of infection noted. CV: RRR Pulmonary: CTA Bilaterally Abdomen: Soft, Nontender, Nondistended Vascular:             Right Upper Extremity: Incision clean and dry. Hand warm / pink. Mildly erythematous. Mild upper extremity edema. No vascular compromise. Good thrill and bruit.             Left Lower Extremity: OR dressing in place. No swelling or drainage. Clean and dry. Thigh soft.  Post-op wounds clean, dry, intact or healing well  Pt. Ambulating, voiding and taking PO diet without difficulty.  Pt pain controlled with PO pain meds.  Labs as below  Complications:none  Consults:  Treatment Team:  Anthonette Legato, MD - Nephrology for dialysis  Significant Diagnostic Studies: CBC Lab Results  Component Value Date   WBC 6.9 11/10/2016   HGB 11.3 (L) 11/10/2016   HCT 33.9 (L) 11/10/2016   MCV 95.1 11/10/2016   PLT 192 11/10/2016   BMET    Component Value Date/Time   NA 138 11/09/2016 0541   K 5.4 (H) 11/09/2016 0541   CL 103 11/09/2016 0541   CO2 25 11/09/2016 0541   GLUCOSE 132 (H) 11/09/2016 0541   BUN 38 (H) 11/09/2016 0541   CREATININE 4.88 (H) 11/09/2016 0541   CALCIUM 7.8 (L) 11/09/2016  0541   GFRNONAA 9 (L) 11/09/2016 0541   GFRAA 10 (L) 11/09/2016 0541   COAG Lab Results  Component Value Date   INR 0.95 10/28/2016   INR 0.93 09/09/2016   Disposition:  Discharge to :Home Discharge Instructions    Discharge patient    Complete by:  As directed        Medication List    STOP taking these medications   HYDROcodone-acetaminophen 5-325 MG tablet Commonly known as:  [place] these  medications   acetaminophen 500 MG tablet Commonly known as:  TYLENOL Take 1,000 mg by mouth every 6 (six) hours as needed for mild pain or headache.   amLODipine 10 MG tablet Commonly known as:  NORVASC Take 1 tablet (10 mg total) by mouth daily.   aspirin EC 81 MG tablet Take 1 tablet (81 mg total) by mouth daily.   atorvastatin 40 MG tablet Commonly known as:  LIPITOR Take 40 mg by mouth daily at 6 PM.   calcium acetate 667 MG capsule Commonly known as:  PHOSLO Take 1 capsule (667 mg total) by mouth 3 (three) times daily with meals.   feeding supplement (NEPRO CARB STEADY) Liqd Take 237 mLs by mouth 2 (two) times daily between meals.   hydrALAZINE 25 MG tablet Commonly known as:  APRESOLINE Take 1 tablet (25 mg total) by mouth 3 (three) times daily.   hydrOXYzine 25 MG tablet Commonly known as:  ATARAX/VISTARIL Take 25 mg by mouth 2 (two) times daily.   insulin NPH-regular Human (70-30) 100 UNIT/ML injection Commonly known as:  NOVOLIN 70/30 Inject 5 Units into the skin 2 (two) times daily with a meal.   losartan 50 MG tablet Commonly known as:  COZAAR Take 1 tablet (50 mg total) by mouth daily.   metoprolol tartrate 25 MG tablet Commonly known as:  LOPRESSOR Take 1 tablet (25 mg total) by mouth 2 (two) times daily.   oxyCODONE-acetaminophen 7.5-325 MG tablet Commonly known as:  PERCOCET One to Two Tabs Every Four to Six Hours As Needed For Pain   traMADol 50 MG tablet Commonly known as:  ULTRAM Take 1 tablet (50 mg total) by mouth every 6 (six) hours as needed.      Verbal and written Discharge instructions given to the patient. Wound care per Discharge AVS Follow-up Information    Rhetta Cleek A Lecretia Buczek, PA-C Follow up.   Specialty:  Physician Assistant Why:  Post-op Suture and Staple Removal. Incision Check.  Contact information: [place] [id number] [phone]          Signed: Sela Hua, PA-C  11/10/2016, 1:51  PM

## 2016-11-10 NOTE — Progress Notes (Signed)
Alert and oriented. Vital signs stable . No signs of acute distress. Discharge instructions given with the help of interpreter.Patient verbalized understanding. No other issues noted at this time.

## 2016-11-10 NOTE — Progress Notes (Signed)
[facility]  ROUNDING NOTE   Subjective:  Patient still having some pain in her right upper extremity. She completed hemodialysis yesterday. Vascular surgery following.    Objective:  Vital signs in last 24 hours:  Temp:  [98.2 F (36.8 C)-100 F (37.8 C)] 100 F (37.8 C) (12/03 0450) Pulse Rate:  [57-68] 66 (12/03 0450) Resp:  [16-20] 18 (12/03 0450) BP: (118-165)/(35-60) 140/50 (12/03 1020) SpO2:  [93 %-100 %] 99 % (12/03 0450) Weight:  [57.7 kg (127 lb 3.3 oz)-70.7 kg (155 lb 12.8 oz)] 70.7 kg (155 lb 12.8 oz) (12/03 0517)  Weight change: 6.2 kg (13 lb 10.7 oz) Filed Weights   11/09/16 1410 11/09/16 1749 11/10/16 0517  Weight: 59.2 kg (130 lb 8.2 oz) 57.7 kg (127 lb 3.3 oz) 70.7 kg (155 lb 12.8 oz)    Intake/Output: I/O last 3 completed shifts: In: 63 [P.O.:480; I.V.:3] Out: 1500 [Other:1500]   Intake/Output this shift:  Total I/O In: 240 [P.O.:240] Out: -   Physical Exam: General: No acute distress  Head: Normocephalic, atraumatic. Moist oral mucosal membranes  Eyes: Anicteric  Neck: Supple, trachea midline  Lungs:  Clear to auscultation, normal effort  Heart: S1S2 no rubs  Abdomen:  Soft, nontender,   Extremities:  peripheral edema.  Neurologic: Nonfocal, moving all four extremities  Skin: No lesions  Access: R IJ permcath, RUE AVF    Basic Metabolic Panel:  Recent Labs Lab 11/08/16 1253 11/08/16 1852 11/09/16 0541  NA 138  --  138  K 4.4  --  5.4*  CL  --   --  103  CO2  --   --  25  GLUCOSE 106*  --  132*  BUN  --   --  38*  CREATININE  --  4.18* 4.88*  CALCIUM  --   --  7.8*  PHOS  --   --  5.7*    Liver Function Tests: No results for input(s): AST, ALT, ALKPHOS, BILITOT, PROT, ALBUMIN in the last 168 hours. No results for input(s): LIPASE, AMYLASE in the last 168 hours. No results for input(s): AMMONIA in the last 168 hours.  CBC:  Recent Labs Lab 11/08/16 1253 11/08/16 1852 11/09/16 0541 11/10/16 0609  WBC  --   9.4 7.5 6.9  HGB 12.9 11.6* 10.9* 11.3*  HCT 38.0 34.9* 33.1* 33.9*  MCV  --  93.7 92.8 95.1  PLT  --  189 183 192    Cardiac Enzymes: No results for input(s): CKTOTAL, CKMB, CKMBINDEX, TROPONINI in the last 168 hours.  BNP: Invalid input(s): POCBNP  CBG:  Recent Labs Lab 11/08/16 1215 11/08/16 1623  GLUCAP 108* 111*    Microbiology: Results for orders placed or performed during the hospital encounter of 10/28/16  Surgical pcr screen     Status: None   Collection Time: 10/28/16  3:57 PM  Result Value Ref Range Status   MRSA, PCR NEGATIVE NEGATIVE Final   Staphylococcus aureus NEGATIVE NEGATIVE Final    Comment:        The Xpert SA Assay (FDA approved for NASAL specimens in patients over 60 years of age), is one component of a comprehensive surveillance program.  Test performance has been validated by [facility] for patients greater than or equal to 43 year old. It is not intended to diagnose infection nor to guide or monitor treatment.     Coagulation Studies: No results for input(s): LABPROT, INR in the last 72 hours.  Urinalysis: No results for input(s): COLORURINE,  LABSPEC, PHURINE, GLUCOSEU, HGBUR, BILIRUBINUR, KETONESUR, PROTEINUR, UROBILINOGEN, NITRITE, LEUKOCYTESUR in the last 72 hours.  Invalid input(s): APPERANCEUR    Imaging: No results found.   Medications:    . amLODipine  10 mg Oral Daily  . aspirin EC  81 mg Oral Daily  . atorvastatin  40 mg Oral q1800  . calcium acetate  667 mg Oral TID WC  . docusate sodium  100 mg Oral BID  . feeding supplement (NEPRO CARB STEADY)  237 mL Oral BID BM  . heparin  5,000 Units Subcutaneous Q8H  . hydrALAZINE  25 mg Oral TID  . hydrOXYzine  25 mg Oral BID  . insulin aspart protamine- aspart  5 Units Subcutaneous BID WC  . losartan  50 mg Oral Daily  . metoprolol tartrate  25 mg Oral BID  . sodium chloride flush  3 mL Intravenous Q12H   sodium chloride, sodium chloride, acetaminophen, alteplase,  heparin, lidocaine (PF), lidocaine-prilocaine, magnesium hydroxide, morphine injection, ondansetron **OR** ondansetron (ZOFRAN) IV, oxyCODONE-acetaminophen, pentafluoroprop-tetrafluoroeth, sodium phosphate, sorbitol  Assessment/ Plan:  60 y.o. 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  Admitted for steal syndrome s/p SVG to right brachial to radial artery bypass.   [facility]  1. ESRD on HD TTS: Patient completed hemodialysis yesterday. No acute indication for dialysis today. We plan for dialysis again on Tuesday if patient is still here.   2. Anemia of chronic kidney disease.Hemoglobin up to 11.3.  Hold off on ESA's for now.  3. Secondary hyperparathyroidism. Phosphorus slightly high yesterday at 5.7. Continue calcium acetate 667 mg by mouth 3 times a day with meals.   4. Hypertension.   blood pressure currently 140/50. Continue amlodipine, hydralazine, losartan, and metoprolol.   5. Steal syndrome. As per vascular surgery.   LOS: 0 Myli Pae 12/3/201712:32 PM

## 2016-11-10 NOTE — Care Management Note (Signed)
Case Management Note  Patient Details  Name: Anita Contreras MRN: [id number] Date of Birth: Oct 20, 1956  Subjective/Objective:        Discharged home with no home health services. Will resume hemodialysis at [facility] in [place] on Tuesday per is on a T-Th-S schedule there.            Action/Plan:   Expected Discharge Date:                  Expected Discharge Plan:     In-House Referral:     Discharge planning Services     Post Acute Care Choice:    Choice offered to:     DME Arranged:    DME Agency:     HH Arranged:    HH Agency:     Status of Service:     If discussed at [employer] of Stay Meetings, dates discussed:    Additional Comments:  Lamiah Marmol A, RN 11/10/2016, 1:56 PM

## 2016-11-10 NOTE — Discharge Instructions (Signed)
Remove leg dressing tomorrow. You may shower as of tomorrow.  Keep inicisons clean and dry.  No driving while on pain medication. Dialysis as per your normal routine.

## 2016-11-11 ENCOUNTER — Encounter: Payer: Self-pay | Admitting: Vascular Surgery

## 2016-11-21 ENCOUNTER — Other Ambulatory Visit (INDEPENDENT_AMBULATORY_CARE_PROVIDER_SITE_OTHER): Payer: Self-pay | Admitting: Vascular Surgery

## 2016-11-21 DIAGNOSIS — T82898D Other specified complication of vascular prosthetic devices, implants and grafts, subsequent encounter: Secondary | ICD-10-CM

## 2016-11-25 ENCOUNTER — Encounter (INDEPENDENT_AMBULATORY_CARE_PROVIDER_SITE_OTHER): Payer: Self-pay

## 2016-11-25 ENCOUNTER — Encounter (INDEPENDENT_AMBULATORY_CARE_PROVIDER_SITE_OTHER): Payer: Self-pay | Admitting: Vascular Surgery

## 2016-12-14 DIAGNOSIS — G8929 Other chronic pain: Secondary | ICD-10-CM | POA: Insufficient documentation

## 2017-01-14 ENCOUNTER — Inpatient Hospital Stay
Admission: EM | Admit: 2017-01-14 | Discharge: 2017-01-17 | DRG: 640 | Disposition: A | Discharge status: Home / Self Care | Payer: Self-pay | Attending: Internal Medicine | Admitting: Internal Medicine

## 2017-01-14 ENCOUNTER — Inpatient Hospital Stay: Payer: Self-pay

## 2017-01-14 ENCOUNTER — Encounter: Payer: Self-pay | Admitting: *Deleted

## 2017-01-14 DIAGNOSIS — E785 Hyperlipidemia, unspecified: Secondary | ICD-10-CM | POA: Diagnosis present

## 2017-01-14 DIAGNOSIS — D631 Anemia in chronic kidney disease: Secondary | ICD-10-CM | POA: Diagnosis present

## 2017-01-14 DIAGNOSIS — E875 Hyperkalemia: Principal | ICD-10-CM

## 2017-01-14 DIAGNOSIS — E872 Acidosis, unspecified: Secondary | ICD-10-CM

## 2017-01-14 DIAGNOSIS — I951 Orthostatic hypotension: Secondary | ICD-10-CM | POA: Diagnosis present

## 2017-01-14 DIAGNOSIS — W19XXXA Unspecified fall, initial encounter: Secondary | ICD-10-CM

## 2017-01-14 DIAGNOSIS — Z9115 Patient's noncompliance with renal dialysis: Secondary | ICD-10-CM

## 2017-01-14 DIAGNOSIS — Z794 Long term (current) use of insulin: Secondary | ICD-10-CM

## 2017-01-14 DIAGNOSIS — R739 Hyperglycemia, unspecified: Secondary | ICD-10-CM

## 2017-01-14 DIAGNOSIS — E1165 Type 2 diabetes mellitus with hyperglycemia: Secondary | ICD-10-CM | POA: Diagnosis present

## 2017-01-14 DIAGNOSIS — I10 Essential (primary) hypertension: Secondary | ICD-10-CM

## 2017-01-14 DIAGNOSIS — R55 Syncope and collapse: Secondary | ICD-10-CM

## 2017-01-14 DIAGNOSIS — R001 Bradycardia, unspecified: Secondary | ICD-10-CM

## 2017-01-14 DIAGNOSIS — I120 Hypertensive chronic kidney disease with stage 5 chronic kidney disease or end stage renal disease: Secondary | ICD-10-CM | POA: Diagnosis present

## 2017-01-14 DIAGNOSIS — T68XXXA Hypothermia, initial encounter: Secondary | ICD-10-CM

## 2017-01-14 DIAGNOSIS — N2581 Secondary hyperparathyroidism of renal origin: Secondary | ICD-10-CM | POA: Diagnosis present

## 2017-01-14 DIAGNOSIS — Z23 Encounter for immunization: Secondary | ICD-10-CM

## 2017-01-14 DIAGNOSIS — Z992 Dependence on renal dialysis: Secondary | ICD-10-CM

## 2017-01-14 DIAGNOSIS — E11319 Type 2 diabetes mellitus with unspecified diabetic retinopathy without macular edema: Secondary | ICD-10-CM | POA: Diagnosis present

## 2017-01-14 DIAGNOSIS — D72829 Elevated white blood cell count, unspecified: Secondary | ICD-10-CM

## 2017-01-14 DIAGNOSIS — E1122 Type 2 diabetes mellitus with diabetic chronic kidney disease: Secondary | ICD-10-CM | POA: Diagnosis present

## 2017-01-14 DIAGNOSIS — E1142 Type 2 diabetes mellitus with diabetic polyneuropathy: Secondary | ICD-10-CM | POA: Diagnosis present

## 2017-01-14 DIAGNOSIS — Z7982 Long term (current) use of aspirin: Secondary | ICD-10-CM

## 2017-01-14 DIAGNOSIS — Z8673 Personal history of transient ischemic attack (TIA), and cerebral infarction without residual deficits: Secondary | ICD-10-CM

## 2017-01-14 DIAGNOSIS — N186 End stage renal disease: Secondary | ICD-10-CM

## 2017-01-14 DIAGNOSIS — R112 Nausea with vomiting, unspecified: Secondary | ICD-10-CM

## 2017-01-14 DIAGNOSIS — R531 Weakness: Secondary | ICD-10-CM

## 2017-01-14 DIAGNOSIS — E1136 Type 2 diabetes mellitus with diabetic cataract: Secondary | ICD-10-CM | POA: Diagnosis present

## 2017-01-14 LAB — CBC
HCT: 36.6 % (ref 35.0–47.0)
HEMOGLOBIN: 11.7 g/dL — AB (ref 12.0–16.0)
MCH: 29.9 pg (ref 26.0–34.0)
MCHC: 32.1 g/dL (ref 32.0–36.0)
MCV: 93 fL (ref 80.0–100.0)
PLATELETS: 127 10*3/uL — AB (ref 150–440)
RBC: 3.93 MIL/uL (ref 3.80–5.20)
RDW: 16.3 % — ABNORMAL HIGH (ref 11.5–14.5)
WBC: 12.7 10*3/uL — ABNORMAL HIGH (ref 3.6–11.0)

## 2017-01-14 LAB — LACTIC ACID, PLASMA
LACTIC ACID, VENOUS: 4.9 mmol/L — AB (ref 0.5–1.9)
Lactic Acid, Venous: 4.6 mmol/L (ref 0.5–1.9)

## 2017-01-14 LAB — BASIC METABOLIC PANEL
ANION GAP: 16 — AB (ref 5–15)
ANION GAP: 13 (ref 5–15)
Anion gap: 14 (ref 5–15)
BUN: 71 mg/dL — ABNORMAL HIGH (ref 6–20)
BUN: 77 mg/dL — AB (ref 6–20)
BUN: 19 mg/dL (ref 6–20)
CALCIUM: 7.3 mg/dL — AB (ref 8.9–10.3)
CALCIUM: 9.1 mg/dL (ref 8.9–10.3)
CHLORIDE: 92 mmol/L — AB (ref 101–111)
CO2: 29 mmol/L (ref 22–32)
CO2: 27 mmol/L (ref 22–32)
CO2: 30 mmol/L (ref 22–32)
CREATININE: 6.75 mg/dL — AB (ref 0.44–1.00)
Calcium: 8.4 mg/dL — ABNORMAL LOW (ref 8.9–10.3)
Chloride: 91 mmol/L — ABNORMAL LOW (ref 101–111)
Chloride: 96 mmol/L — ABNORMAL LOW (ref 101–111)
Creatinine, Ser: 7.38 mg/dL — ABNORMAL HIGH (ref 0.44–1.00)
Creatinine, Ser: 3.06 mg/dL — ABNORMAL HIGH (ref 0.44–1.00)
GFR calc Af Amer: 6 mL/min — ABNORMAL LOW (ref >60)
GFR calc Af Amer: 18 mL/min — ABNORMAL LOW (ref >60)
GFR calc non Af Amer: 5 mL/min — ABNORMAL LOW (ref >60)
GFR calc non Af Amer: 16 mL/min — ABNORMAL LOW (ref >60)
GFR, EST AFRICAN AMERICAN: 7 mL/min — AB (ref >60)
GFR, EST NON AFRICAN AMERICAN: 6 mL/min — AB (ref >60)
GLUCOSE: 52 mg/dL — AB (ref 65–99)
Glucose, Bld: 585 mg/dL (ref 65–99)
Glucose, Bld: 417 mg/dL — ABNORMAL HIGH (ref 65–99)
Potassium: >7.5 mmol/L (ref 3.5–5.1)
Potassium: 4.2 mmol/L (ref 3.5–5.1)
SODIUM: 136 mmol/L (ref 135–145)
SODIUM: 133 mmol/L — AB (ref 135–145)
Sodium: 139 mmol/L (ref 135–145)

## 2017-01-14 LAB — BLOOD GAS, ARTERIAL
ACID-BASE DEFICIT: 4.1 mmol/L — AB (ref 0.0–2.0)
Bicarbonate: 23.3 mmol/L (ref 20.0–28.0)
FIO2: 21
O2 SAT: 91.2 %
PATIENT TEMPERATURE: 37
PCO2 ART: 52 mmHg — AB (ref 32.0–48.0)
pH, Arterial: 7.26 — ABNORMAL LOW (ref 7.350–7.450)
pO2, Arterial: 71 mmHg — ABNORMAL LOW (ref 83.0–108.0)

## 2017-01-14 LAB — GLUCOSE, CAPILLARY
GLUCOSE-CAPILLARY: 407 mg/dL — AB (ref 65–99)
GLUCOSE-CAPILLARY: 423 mg/dL — AB (ref 65–99)
GLUCOSE-CAPILLARY: 112 mg/dL — AB (ref 65–99)
Glucose-Capillary: 433 mg/dL — ABNORMAL HIGH (ref 65–99)
Glucose-Capillary: 51 mg/dL — ABNORMAL LOW (ref 65–99)
Glucose-Capillary: 96 mg/dL (ref 65–99)

## 2017-01-14 LAB — TROPONIN I

## 2017-01-14 LAB — LIPASE, BLOOD: LIPASE: 29 U/L (ref 11–51)

## 2017-01-14 LAB — INFLUENZA PANEL BY PCR (TYPE A & B)
INFLAPCR: NEGATIVE
Influenza B By PCR: NEGATIVE

## 2017-01-14 LAB — MRSA PCR SCREENING: MRSA BY PCR: NEGATIVE

## 2017-01-14 LAB — PROCALCITONIN: PROCALCITONIN: 0.32 ng/mL

## 2017-01-14 MED ORDER — PIPERACILLIN-TAZOBACTAM 3.375 G IVPB
3.3750 g | Freq: Once | INTRAVENOUS | Status: AC
  Administered 2017-01-14: 3.375 g via INTRAVENOUS
  Filled 2017-01-14: qty 50

## 2017-01-14 MED ORDER — ATROPINE SULFATE 1 MG/10ML IJ SOSY
0.5000 mg | PREFILLED_SYRINGE | Freq: Once | INTRAMUSCULAR | Status: AC
  Administered 2017-01-14: 0.5 mg via INTRAVENOUS

## 2017-01-14 MED ORDER — HEPARIN SODIUM (PORCINE) 5000 UNIT/ML IJ SOLN
5000.0000 [IU] | Freq: Three times a day (TID) | INTRAMUSCULAR | Status: DC
  Administered 2017-01-14 – 2017-01-17 (×9): 5000 [IU] via SUBCUTANEOUS
  Filled 2017-01-14 (×9): qty 1

## 2017-01-14 MED ORDER — ATROPINE SULFATE 1 MG/10ML IJ SOSY
1.0000 mg | PREFILLED_SYRINGE | Freq: Once | INTRAMUSCULAR | Status: AC
  Administered 2017-01-14: 1 mg via INTRAVENOUS

## 2017-01-14 MED ORDER — MORPHINE SULFATE (PF) 4 MG/ML IV SOLN: 1.0000 mg | INTRAVENOUS | Status: DC | PRN

## 2017-01-14 MED ORDER — ATROPINE SULFATE 1 MG/10ML IJ SOSY
PREFILLED_SYRINGE | INTRAMUSCULAR | Status: AC
  Filled 2017-01-14: qty 10

## 2017-01-14 MED ORDER — INSULIN ASPART 100 UNIT/ML IV SOLN
10.0000 [IU] | INTRAVENOUS | Status: AC
  Administered 2017-01-14: 10 [IU] via INTRAVENOUS
  Filled 2017-01-14: qty 0.1

## 2017-01-14 MED ORDER — INSULIN ASPART 100 UNIT/ML ~~LOC~~ SOLN
10.0000 [IU] | Freq: Once | SUBCUTANEOUS | Status: AC
  Administered 2017-01-14: 10 [IU] via INTRAVENOUS
  Filled 2017-01-14: qty 1

## 2017-01-14 MED ORDER — CALCIUM CHLORIDE 10 % IV SOLN
1.0000 g | Freq: Once | INTRAVENOUS | Status: AC
  Administered 2017-01-14: 1 g via INTRAVENOUS

## 2017-01-14 MED ORDER — SODIUM POLYSTYRENE SULFONATE 15 GM/60ML PO SUSP
30.0000 g | Freq: Once | ORAL | Status: AC
  Administered 2017-01-14: 30 g via ORAL
  Filled 2017-01-14: qty 120

## 2017-01-14 MED ORDER — ONDANSETRON HCL 4 MG/2ML IJ SOLN
4.0000 mg | Freq: Once | INTRAMUSCULAR | Status: AC | PRN
  Administered 2017-01-14: 4 mg via INTRAVENOUS
  Filled 2017-01-14: qty 2

## 2017-01-14 MED ORDER — INSULIN ASPART 100 UNIT/ML ~~LOC~~ SOLN
0.0000 [IU] | Freq: Three times a day (TID) | SUBCUTANEOUS | Status: DC
  Administered 2017-01-15: 5 [IU] via SUBCUTANEOUS
  Administered 2017-01-16: 1 [IU] via SUBCUTANEOUS
  Administered 2017-01-16: 3 [IU] via SUBCUTANEOUS
  Administered 2017-01-17: 1 [IU] via SUBCUTANEOUS
  Administered 2017-01-17: 2 [IU] via SUBCUTANEOUS
  Filled 2017-01-14: qty 1
  Filled 2017-01-14: qty 2
  Filled 2017-01-14: qty 5
  Filled 2017-01-14: qty 1
  Filled 2017-01-14: qty 3
  Filled 2017-01-14: qty 9

## 2017-01-14 MED ORDER — ACETAMINOPHEN 650 MG RE SUPP: 650.0000 mg | Freq: Four times a day (QID) | RECTAL | Status: DC | PRN

## 2017-01-14 MED ORDER — DOPAMINE-DEXTROSE 3.2-5 MG/ML-% IV SOLN
0.0000 ug/kg/min | INTRAVENOUS | Status: DC
  Administered 2017-01-14: 10 ug/kg/min via INTRAVENOUS
  Filled 2017-01-14: qty 250

## 2017-01-14 MED ORDER — ATROPINE SULFATE 1 MG/10ML IJ SOSY: 1.0000 mg | PREFILLED_SYRINGE | Freq: Once | INTRAMUSCULAR | Status: AC

## 2017-01-14 MED ORDER — DEXTROSE 50 % IV SOLN
1.0000 | INTRAVENOUS | Status: AC
Start: 2017-01-14 — End: 2017-01-14
  Administered 2017-01-14: 50 mL via INTRAVENOUS

## 2017-01-14 MED ORDER — HYDRALAZINE HCL 20 MG/ML IJ SOLN: 10.0000 mg | INTRAMUSCULAR | Status: DC | PRN

## 2017-01-14 MED ORDER — INSULIN GLARGINE 100 UNIT/ML ~~LOC~~ SOLN
10.0000 [IU] | Freq: Once | SUBCUTANEOUS | Status: DC
  Filled 2017-01-14: qty 0.1

## 2017-01-14 MED ORDER — VANCOMYCIN HCL IN DEXTROSE 1-5 GM/200ML-% IV SOLN
1000.0000 mg | Freq: Once | INTRAVENOUS | Status: AC
  Administered 2017-01-14: 1000 mg via INTRAVENOUS
  Filled 2017-01-14: qty 200

## 2017-01-14 MED ORDER — PIPERACILLIN-TAZOBACTAM 3.375 G IVPB: 3.3750 g | Freq: Two times a day (BID) | INTRAVENOUS | Status: DC

## 2017-01-14 MED ORDER — SODIUM BICARBONATE 8.4 % IV SOLN
50.0000 meq | Freq: Once | INTRAVENOUS | Status: AC
  Administered 2017-01-14: 50 meq via INTRAVENOUS

## 2017-01-14 MED ORDER — ACETAMINOPHEN 325 MG PO TABS
325.0000 mg | ORAL_TABLET | Freq: Four times a day (QID) | ORAL | Status: DC | PRN
  Administered 2017-01-14 – 2017-01-17 (×2): 325 mg via ORAL
  Filled 2017-01-14 (×2): qty 1

## 2017-01-14 MED ORDER — ATROPINE SULFATE 1 MG/10ML IJ SOSY
PREFILLED_SYRINGE | INTRAMUSCULAR | Status: AC
  Administered 2017-01-14: 0.5 mg
  Filled 2017-01-14: qty 10

## 2017-01-14 MED ORDER — INSULIN ASPART 100 UNIT/ML ~~LOC~~ SOLN
0.0000 [IU] | Freq: Every day | SUBCUTANEOUS | Status: DC
  Administered 2017-01-16: 2 [IU] via SUBCUTANEOUS
  Filled 2017-01-14: qty 1
  Filled 2017-01-14: qty 2

## 2017-01-14 MED ORDER — DEXTROSE 50 % IV SOLN
INTRAVENOUS | Status: AC
  Administered 2017-01-14: 50 mL
  Filled 2017-01-14: qty 50

## 2017-01-14 MED FILL — Medication: Qty: 1 | Status: AC

## 2017-01-14 NOTE — ED Notes (Signed)
Patient went into torsades de pointes. Patient self converted. MD and primary RN aware. Cardiac monitor strip printed out and given to MD.

## 2017-01-14 NOTE — ED Triage Notes (Signed)
Pt reports starting to feel badly last night at 2100. Pt normally checks CBG once a day in morning. Pt started vomiting this morning.

## 2017-01-14 NOTE — Progress Notes (Signed)
  End of hd 

## 2017-01-14 NOTE — ED Notes (Signed)
Bear hugger applied to pt, rectal temp 95.1.

## 2017-01-14 NOTE — Progress Notes (Signed)
[place] for glucose management  Indication: hyperglycemia   No Known Allergies  Patient Measurements: Height: 4\' 5"  (134.6 cm) Weight: 99 lb 13.9 oz (45.3 kg) IBW/kg (Calculated) : 29.4   Vital Signs: Temp: 98.2 F (36.8 C) (02/06 1425) Temp Source: Oral (02/06 1425) BP: 161/62 (02/06 1500) Pulse Rate: 64 (02/06 1500) Intake/Output from previous day: No intake/output data recorded. Intake/Output from this shift: No intake/output data recorded.  Labs:  Recent Labs  01/14/17 0749  WBC 12.7*  HGB 11.7*  HCT 36.6  PLT 127*  CREATININE 6.75*   Estimated Creatinine Clearance: 5 mL/min (by C-G formula based on SCr of 6.75 mg/dL (H)).   Microbiology: Recent Results (from the past 720 hour(s))  MRSA PCR Screening     Status: None   Collection Time: 01/14/17 12:25 PM  Result Value Ref Range Status   MRSA by PCR NEGATIVE NEGATIVE Final    Comment:        The GeneXpert MRSA Assay (FDA approved for NASAL specimens only), is one component of a comprehensive MRSA colonization surveillance program. It is not intended to diagnose MRSA infection nor to guide or monitor treatment for MRSA infections.     Medical History: Past Medical History:  Diagnosis Date  . Acute cystitis with hematuria   . Anemia   . Cataracts, bilateral   . CKD (chronic kidney disease)   . Diabetic retinopathy, background ([place])   . Dialysis patient ([place])    Tues, Thurs, Sat  . Dizziness   . Hyperlipidemia   . Hypertension   . Migraine variant   . Neuropathy ([place])   . OP (osteoporosis)   . Personal history of noncompliance with medical treatment, presenting hazards to health   . Steal syndrome as complication of dialysis access ([place])   . Stroke ([place])   . Type II diabetes mellitus with ophthalmic manifestations, uncontrolled ([place])   . Vitamin D deficiency     Assessment: 61yo female presenting with emergent need for HD. Glucose on admission of  585.    Plan:  Patient given 20units of insulin aspart. Patient has orders for Lantus 10units x1 and SSI. Will reassess blood sugar.   Loree Fee, PharmD 01/14/2017,3:11 PM

## 2017-01-14 NOTE — ED Provider Notes (Addendum)
[facility] Emergency Department Provider Note        Time seen: ----------------------------------------- 7:14 AM on 01/14/2017 -----------------------------------------    I have reviewed the triage vital signs and the nursing notes.   HISTORY  Chief Complaint Hyperglycemia    HPI Anita Contreras is a 61 y.o. female who presents to the ER stating she felt bad last night beginning at around 2100 hrs. She normally checks her blood sugars once a day in the morning. She states she started vomiting this morning. Family states she did not take her medicine for diabetes last night because she did not eat. She has had chills this morning because he got cold with amylase came to pick her up but they deny any recent illness. She is due for dialysis this morning.   Past Medical History:  Diagnosis Date  . Acute cystitis with hematuria   . Anemia   . Cataracts, bilateral   . CKD (chronic kidney disease)   . Diabetic retinopathy, background ([place])   . Dialysis patient ([place])    Tues, Thurs, Sat  . Dizziness   . Hyperlipidemia   . Hypertension   . Migraine variant   . Neuropathy ([place])   . OP (osteoporosis)   . Personal history of noncompliance with medical treatment, presenting hazards to health   . Steal syndrome as complication of dialysis access ([place])   . Stroke ([place])   . Type II diabetes mellitus with ophthalmic manifestations, uncontrolled ([place])   . Vitamin D deficiency     Patient Active Problem List   Diagnosis Date Noted  . Steal syndrome as complication of dialysis access ([place]) 11/08/2016  . ESRD on dialysis ([place]) 10/15/2016  . Renal dialysis device, implant, or graft complication 97/35/3299  . Hyperlipidemia 10/15/2016  . Essential hypertension 10/15/2016  . Controlled diabetes mellitus type 2 with complications ([place]) 24/26/8341  . Acute on chronic renal failure ([place]) 04/23/2016  . 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 Acute right flank pain   . ARF (acute renal failure) ([place]) 04/15/2016  . Acute cystitis 05/26/2015  . Neuropathy ([place]) 05/26/2015    Past Surgical History:  Procedure Laterality Date  . AV FISTULA PLACEMENT Right 10/10/2016   Procedure: ARTERIOVENOUS (AV) FISTULA CREATION ( BRACHIOCEPHALIC );  Surgeon: Algernon Huxley, MD;  Location: [facility] ORS;  Service: Vascular;  Laterality: Right;  . DILATION AND CURETTAGE OF UTERUS    . DISTAL REVASCULARIZATION AND INTERVAL LIGATION (DRIL) Right 11/08/2016   Procedure: DISTAL REVASCULARIZATION AND INTERVAL LIGATION (DRIL);  Surgeon: Algernon Huxley, MD;  Location: [facility] ORS;  Service: Vascular;  Laterality: Right;  . PERIPHERAL VASCULAR CATHETERIZATION N/A 04/25/2016   Procedure: Dialysis/Perma Catheter Insertion;  Surgeon: Algernon Huxley, MD;  Location: [facility] CV LAB;  Service: Cardiovascular;  Laterality: N/A;  . PERIPHERAL VASCULAR CATHETERIZATION Right 10/17/2016   Procedure: Upper Extremity Angiography;  Surgeon: Algernon Huxley, MD;  Location: [facility] CV LAB;  Service: Cardiovascular;  Laterality: Right;    Allergies Patient has no known allergies.  Social History Social History  Substance Use Topics  . Smoking status: Never Smoker  . Smokeless tobacco: Never Used  . Alcohol use No    Review of Systems Constitutional: Negative for fever.Positive for chills Cardiovascular: Negative for chest pain. Respiratory: Negative for shortness of breath. Gastrointestinal: Negative for abdominal pain, positive for vomiting Genitourinary: Negative for dysuria. Musculoskeletal: Negative for back pain. Skin: Negative for rash. Neurological: Negative for headaches, focal weakness  or numbness.  10-point ROS otherwise negative.  ____________________________________________   PHYSICAL EXAM:  VITAL SIGNS: ED Triage Vitals  Enc Vitals Group     BP 01/14/17 0657 (!) 155/54     Pulse Rate 01/14/17 0657 (!) 49     Resp 01/14/17 0657  (!) 24     Temp --      Temp src --      SpO2 01/14/17 0657 100 %     Weight 01/14/17 0659 100 lb (45.4 kg)     Height 01/14/17 0659 4\' 5"  (1.346 m)     Head Circumference --      Peak Flow --      Pain Score --      Pain Loc --      Pain Edu? --      Excl. in [place]? --     Constitutional: Alert and oriented.Mild distress Eyes: Conjunctivae are normal. PERRL. Normal extraocular movements. ENT   Head: Normocephalic and atraumatic.   Nose: No congestion/rhinnorhea.   Mouth/Throat: Mucous membranes are moist.   Neck: No stridor. Cardiovascular: Slow rate, regular rhythm. No murmurs, rubs, or gallops. Respiratory: Normal respiratory effort without tachypnea nor retractions. Breath sounds are clear and equal bilaterally. No wheezes/rales/rhonchi. Gastrointestinal: Soft and nontender. Normal bowel sounds Musculoskeletal: Nontender with normal range of motion in all extremities. No lower extremity tenderness nor edema. Neurologic:  Normal speech and language. Errol as weakness, nothing focal Skin:  Skin is warm, dry and intact. No rash noted. Psychiatric: Mood and affect are normal. Speech and behavior are normal.  ____________________________________________  EKG: Interpreted by me. Sinus bradycardia with rate of 45 bpm, normal QRS size, long QT, and left axis deviation  Repeat EKG interpreted by me, sinus rhythm, left anterior fascicular block, long QT, possible hyperacute T waves.  ____________________________________________  ED COURSE:  Pertinent labs & imaging results that were available during my care of the patient were reviewed by me and considered in my medical decision making (see chart for details). She presents to ER for hyperglycemia and weakness. We will assess with labs and imaging if needed Clinical Course as of Jan 14 906  Tue Jan 14, 2017  0755 Patient began having long sinus pauses and other arrhythmia. This is presumed to be electrolyte related  secondary to end-stage renal disease.  [JW]  [phone] We have given an amp of sodium bicarbonate and 2g  of calcium  [JW]  0846 Patient now found to be hypothermic with a rectal temperature of 95.1. She's being placed on the bear hugger.  [JW]    Clinical Course User Index [JW] Earleen Newport, MD   Procedures ____________________________________________   LABS (pertinent positives/negatives)  Labs Reviewed  BASIC METABOLIC PANEL - Abnormal; Notable for the following:       Result Value   Potassium >7.5 (*)    Chloride 91 (*)    Glucose, Bld 585 (*)    BUN 71 (*)    Creatinine, Ser 6.75 (*)    Calcium 7.3 (*)    GFR calc non Af Amer 6 (*)    GFR calc Af Amer 7 (*)    Anion gap 16 (*)    All other components within normal limits  CBC - Abnormal; Notable for the following:    WBC 12.7 (*)    Hemoglobin 11.7 (*)    RDW 16.3 (*)    Platelets 127 (*)    All other components within normal limits  LACTIC ACID, PLASMA -  Abnormal; Notable for the following:    Lactic Acid, Venous 4.9 (*)    All other components within normal limits  BLOOD GAS, ARTERIAL - Abnormal; Notable for the following:    pH, Arterial 7.26 (*)    pCO2 arterial 52 (*)    pO2, Arterial 71 (*)    Acid-base deficit 4.1 (*)    All other components within normal limits  CULTURE, BLOOD (ROUTINE X 2)  CULTURE, BLOOD (ROUTINE X 2)  LIPASE, BLOOD  TROPONIN I  URINALYSIS, COMPLETE (UACMP) WITH MICROSCOPIC  LACTIC ACID, PLASMA  CBG MONITORING, ED   CRITICAL CARE Performed by: Earleen Newport   Total critical care time: 45 minutes  Critical care time was exclusive of separately billable procedures and treating other patients.  Critical care was necessary to treat or prevent imminent or life-threatening deterioration.  Critical care was time spent personally by me on the following activities: development of treatment plan with patient and/or surrogate as well as nursing, discussions with consultants,  evaluation of patient's response to treatment, examination of patient, obtaining history from patient or surrogate, ordering and performing treatments and interventions, ordering and review of laboratory studies, ordering and review of radiographic studies, pulse oximetry and re-evaluation of patient's condition. ____________________________________________  FINAL ASSESSMENT AND PLAN  Hyperglycemia, hypothermia, hyperkalemia, end-stage renal disease  Plan: Patient with labs and imaging as dictated above. Patient presented to the ER possibly septic. Her main complaint is general ill feeling and vomiting. Potassium critically elevated as well as glucose. She was found to be acidemic with an elevated lactic acid level. We started her on fluids, insulin, Kayexalate, bicarbonate and calcium. She had received initially several amps of calcium and bicarbonate prior to the potassium coming back markedly elevated. I discussed with nephrology and she will need emergent dialysis. I'll also talk to the ICU attending she would be best served in the ICU. Currently her blood pressure is 110/48. Her heart rate is 48 bpm. She is mentating but very drowsy at this time. She also received IV antibiotics for possible sepsis. He remains in critical condition.   Earleen Newport, MD   Note: This note was generated in part or whole with voice recognition software. Voice recognition is usually quite accurate but there are transcription errors that can and very often do occur. I apologize for any typographical errors that were not detected and corrected.     Earleen Newport, MD 01/14/17 9323    Earleen Newport, MD 01/14/17 [id number]

## 2017-01-14 NOTE — ED Notes (Signed)
Unable to obtain second set of blood cultures due to difficult stick.  Sending first set down only.

## 2017-01-14 NOTE — Progress Notes (Signed)
Recent lab reveals serum glucose of 52.  FSBS reveals blood sugar of 52.  4oz Orange Juice administered.  Will continue to monitor.

## 2017-01-14 NOTE — Progress Notes (Signed)
Post hd vitals 

## 2017-01-14 NOTE — Progress Notes (Signed)
Pre hd info 

## 2017-01-14 NOTE — Progress Notes (Signed)
Hd start 

## 2017-01-14 NOTE — ED Notes (Addendum)
1 amp Bicarb given at this time IV at this time

## 2017-01-14 NOTE — Progress Notes (Signed)
Pre hd assessment  

## 2017-01-14 NOTE — Progress Notes (Signed)
Pharmacy Antibiotic Note  Anita Contreras is a 61 y.o. female admitted on 01/14/2017 with sepsis.  Pharmacy has been consulted for Zosyn/vancomycin dosing.  Plan: Patient received one dose of vancomycin and Zosyn. After speaking with Dr. Mortimer Fries, will discontinue antibiotics at this time.   Height: 4\' 5"  (134.6 cm) Weight: 100 lb (45.4 kg) IBW/kg (Calculated) : 29.4  Temp (24hrs), Avg:97.1 F (36.2 C), Min:95.1 F (35.1 C), Max:98.2 F (36.8 C)   Recent Labs Lab 01/14/17 0749 01/14/17 1002  WBC 12.7*  --   CREATININE 6.75*  --   LATICACIDVEN 4.9* 4.6*    Estimated Creatinine Clearance: 5 mL/min (by C-G formula based on SCr of 6.75 mg/dL (H)).    No Known Allergies  Antimicrobials this admission: Vanc 2/6 >> 2/6 Zosyn 2/6 >> 2/6  Thank you for allowing pharmacy to be a part of this patient's care.  Loree Fee, PharmD 01/14/2017 2:17 PM

## 2017-01-14 NOTE — Care Management Note (Signed)
Case Management Note  Patient Details  Name: Ernesta Trabert MRN: [id number] Date of Birth: Sep 03, 1956  Subjective/Objective:                   RNCM to follow for medication assistance needs. Patient's PCP is with [facility] [phone] (not Dr. Dema Severin). They assist patient with medication needs however they are not open on the weekend. Hemodialysis T, Th, Sat [facility].  Action/Plan: RNCM to continue to follow.   Expected Discharge Date:                  Expected Discharge Plan:     In-House Referral:     Discharge planning Services  CM Consult, [facility], Medication Assistance  Post Acute Care Choice:    Choice offered to:     DME Arranged:    DME Agency:     HH Arranged:    HH Agency:     Status of Service:  In process, will continue to follow  If discussed at Long Length of Stay Meetings, dates discussed:    Additional Comments:  Marshell Garfinkel, RN 01/14/2017, 1:47 PM

## 2017-01-14 NOTE — Progress Notes (Signed)
1830 Repeat BMP drawn

## 2017-01-14 NOTE — ED Notes (Signed)
While attempting to start IV,  Pt HR went to 30,  No p-wave noted, pt also with wide complex,  Pt arousable but confused.  Pt HR then to aystole on monitor for approx 5-10 seconds.  Pt with seizure like activity noted. Eyes rolled backwards and pt unresponsive.  Pt family remains at bedside. Dr Jimmye Norman called to bedside.  Pt then spontaneously started with HR of 140's.  Second IV started, labs drawn and sent nursing supervisor at bedside.

## 2017-01-14 NOTE — ED Notes (Signed)
1 mg atropine given iv per orders from dr Jimmye Norman.  Family remains at bedside.

## 2017-01-14 NOTE — Progress Notes (Signed)
Post hd assessment 

## 2017-01-14 NOTE — ED Notes (Signed)
0.5mg  atropine given IV by Bellerose Desanctis

## 2017-01-14 NOTE — ED Notes (Signed)
1 amp calcium given IV at this time.

## 2017-01-14 NOTE — ED Notes (Signed)
0.5mg  atropine given iv per orders from dr Jimmye Norman.

## 2017-01-14 NOTE — Progress Notes (Signed)
[facility]  ROUNDING NOTE   Subjective:   Ms. Nohemy Koop admitted to [facility] on 01/14/2017 for End stage renal disease ([place]) [N18.6] Hyperkalemia [E87.5] Hyperglycemia [R73.9] Hypothermia, initial encounter [T68.XXXA]  She was feeling bad. Last dialysis was Saturday.   Presents with hyperkalemia with bradycardia and peaked T waves.   Objective:  Vital signs in last 24 hours:  Temp:  [95.1 F (35.1 C)-98.2 F (36.8 C)] 98.2 F (36.8 C) (02/06 1133) Pulse Rate:  [38-67] 38 (02/06 1132) Resp:  [12-30] 16 (02/06 1132) BP: (110-183)/(42-75) 114/48 (02/06 1132) SpO2:  [96 %-100 %] 100 % (02/06 1132) Weight:  [45.4 kg (100 lb)] 45.4 kg (100 lb) (02/06 0659)  Weight change:  Filed Weights   01/14/17 0659  Weight: 45.4 kg (100 lb)    Intake/Output: No intake/output data recorded.   Intake/Output this shift:  No intake/output data recorded.  Physical Exam: General: NAD, laying in bed  Head: Normocephalic, atraumatic. Moist oral mucosal membranes  Eyes: Anicteric, PERRL  Neck: Supple, trachea midline  Lungs:  Clear to auscultation  Heart: Regular rate and rhythm  Abdomen:  Soft, nontender,   Extremities:  1+ peripheral edema.  Neurologic: Nonfocal, moving all four extremities  Skin: No lesions  Access: RIJ permcath, maturing Right AVF    Basic Metabolic Panel:  Recent Labs Lab 01/14/17 0749  NA 136  K >7.5*  CL 91*  CO2 29  GLUCOSE 585*  BUN 71*  CREATININE 6.75*  CALCIUM 7.3*    Liver Function Tests: No results for input(s): AST, ALT, ALKPHOS, BILITOT, PROT, ALBUMIN in the last 168 hours.  Recent Labs Lab 01/14/17 0749  LIPASE 29   No results for input(s): AMMONIA in the last 168 hours.  CBC:  Recent Labs Lab 01/14/17 0749  WBC 12.7*  HGB 11.7*  HCT 36.6  MCV 93.0  PLT 127*    Cardiac Enzymes:  Recent Labs Lab 01/14/17 0749  TROPONINI <0.03    BNP: Invalid input(s): POCBNP  CBG:  Recent Labs Lab  01/14/17 1131  GLUCAP 433*    Microbiology: Results for orders placed or performed during the hospital encounter of 10/28/16  Surgical pcr screen     Status: None   Collection Time: 10/28/16  3:57 PM  Result Value Ref Range Status   MRSA, PCR NEGATIVE NEGATIVE Final   Staphylococcus aureus NEGATIVE NEGATIVE Final    Comment:        The Xpert SA Assay (FDA approved for NASAL specimens in patients over 70 years of age), is one component of a comprehensive surveillance program.  Test performance has been validated by [facility] for patients greater than or equal to 103 year old. It is not intended to diagnose infection nor to guide or monitor treatment.     Coagulation Studies: No results for input(s): LABPROT, INR in the last 72 hours.  Urinalysis: No results for input(s): COLORURINE, LABSPEC, PHURINE, GLUCOSEU, HGBUR, BILIRUBINUR, KETONESUR, PROTEINUR, UROBILINOGEN, NITRITE, LEUKOCYTESUR in the last 72 hours.  Invalid input(s): APPERANCEUR    Imaging: No results found.   Medications:    . atropine      . heparin  5,000 Units Subcutaneous Q8H  . piperacillin-tazobactam (ZOSYN)  IV  3.375 g Intravenous Q12H     Assessment/ Plan:  Ms. Lilyona Richner is a 61 y.o. 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   [facility]  1. ESRD on  HD TTS: with hyperkalemia.  - emergent hemodialysis today. Orders prepared.   2. Anemia of chronic kidney disease: Hemoglobin 11.7 - hold epo  3. Secondary hyperparathyroidism. With hypocalcemia - calcium acetate with meals.    LOS: 0 Burlene Montecalvo 2/6/201812:02 PM

## 2017-01-14 NOTE — Progress Notes (Signed)
Notified by ED at 9:09 am that patient will need emergent hemodialysis for hyperkalemia. Dialysis staff notified. Patient will get dialysis once admitted.   Lavonia Dana

## 2017-01-14 NOTE — H&P (Signed)
PULMONARY / CRITICAL CARE MEDICINE   Name: Anita Contreras MRN: [id number] DOB: 03-Jun-1956    ADMISSION DATE:  01/14/2017  CHIEF COMPLAINT:  Feeling bad  HISTORY OF PRESENT ILLNESS  61 y.o. female who presents to the ER stating she felt bad last night beginning at around 2100 hrs. She normally checks her blood sugars once a day in the morning. She states she started vomiting this morning.  -Family states she did not take her medicine for diabetes last night because she did not eat.  -She is NON english speaking  Patient found tohave K>7.5 was given Kayoxelate, insulin/d50 and Bicarb/atropine Patient has h/o ESRD on HD and did NOT miss her usual HD  LA is 4.9 K>7.5   Patient has significant hypotension and severe bradycardia with reports of torsaded HD needed ASAP starte don dopamine infusion   PAST MEDICAL HISTORY :   has a past medical history of Acute cystitis with hematuria; Anemia; Cataracts, bilateral; CKD (chronic kidney disease); Diabetic retinopathy, background ([place]); Dialysis patient [facility]); Dizziness; Hyperlipidemia; Hypertension; Migraine variant; Neuropathy ([place]); OP (osteoporosis); Personal history of noncompliance with medical treatment, presenting hazards to health; Steal syndrome as complication of dialysis access [facility]); Stroke [facility]); Type II diabetes mellitus with ophthalmic manifestations, uncontrolled ([place]); and Vitamin D deficiency.  has a past surgical history that includes Cardiac catheterization (N/A, 04/25/2016); Dilation and curettage of uterus; AV fistula placement (Right, 10/10/2016); Cardiac catheterization (Right, 10/17/2016); and Distal revascularization and interval ligation (dril) (Right, 11/08/2016). Prior to Admission medications   Medication Sig Start Date End Date Taking? Authorizing Provider  acetaminophen (TYLENOL) 500 MG tablet Take 1,000 mg by mouth every 6 (six) hours as needed for mild pain or headache.   Yes Historical Provider, MD   atorvastatin (LIPITOR) 40 MG tablet Take 40 mg by mouth daily at 6 PM.    Yes Historical Provider, MD  hydrALAZINE (APRESOLINE) 25 MG tablet Take 1 tablet (25 mg total) by mouth 3 (three) times daily. 04/19/16  Yes Fritzi Mandes, MD  hydrOXYzine (ATARAX/VISTARIL) 25 MG tablet Take 25 mg by mouth 2 (two) times daily as needed for itching.   Yes Historical Provider, MD  insulin NPH-regular Human (NOVOLIN 70/30) (70-30) 100 UNIT/ML injection Inject 6 Units into the skin 2 (two) times daily with a meal.    Yes Historical Provider, MD  losartan (COZAAR) 50 MG tablet Take 1 tablet (50 mg total) by mouth daily. 05/07/16  Yes Epifanio Lesches, MD  metoprolol tartrate (LOPRESSOR) 25 MG tablet Take 1 tablet (25 mg total) by mouth 2 (two) times daily. 04/19/16  Yes Fritzi Mandes, MD  amLODipine (NORVASC) 10 MG tablet Take 1 tablet (10 mg total) by mouth daily. Patient not taking: Reported on 01/14/2017 04/19/16   Fritzi Mandes, MD  aspirin EC 81 MG tablet Take 1 tablet (81 mg total) by mouth daily. 11/08/16   Algernon Huxley, MD  calcium acetate (PHOSLO) 667 MG capsule Take 1 capsule (667 mg total) by mouth 3 (three) times daily with meals. Patient not taking: Reported on 01/14/2017 04/19/16   Fritzi Mandes, MD  Nutritional Supplements (FEEDING SUPPLEMENT, NEPRO CARB STEADY,) LIQD Take 237 mLs by mouth 2 (two) times daily between meals. 05/07/16   Epifanio Lesches, MD  oxyCODONE-acetaminophen (PERCOCET) 7.5-325 MG tablet One to Two Tabs Every Four to Six Hours As Needed For Pain Patient not taking: Reported on 01/14/2017 11/10/16   Joelene Millin A Stegmayer, PA-C  traMADol (ULTRAM) 50 MG tablet Take 1 tablet (50 mg total) by mouth every  6 (six) hours as needed. Patient not taking: Reported on 10/25/2016 05/22/16   Daymon Larsen, MD   No Known Allergies  FAMILY HISTORY:  indicated that her mother is alive. She indicated that her father is alive.   SOCIAL HISTORY:  reports that she has never smoked. She has never used smokeless  tobacco. She reports that she does not drink alcohol or use drugs.  REVIEW OF SYSTEMS:  Unobtainable due to critical illness and lethargy    VITAL SIGNS: Temp:  [95.1 F (35.1 C)-98.2 F (36.8 C)] 98.1 F (36.7 C) (02/06 1200) Pulse Rate:  [38-100] 56 (02/06 1245) Resp:  [12-30] 23 (02/06 1245) BP: (110-224)/(42-75) 167/57 (02/06 1245) SpO2:  [96 %-100 %] 97 % (02/06 1245) Weight:  [100 lb (45.4 kg)] 100 lb (45.4 kg) (02/06 0659) HEMODYNAMICS:    PHYSICAL EXAMINATION:  GENERAL:critically ill appearing, +resp distress HEAD: Normocephalic, atraumatic.  EYES: Pupils equal, round, reactive to light.  No scleral icterus.  MOUTH: Moist mucosal membrane. NECK: Supple. No thyromegaly. No nodules. No JVD.  PULMONARY: CTA b/l CARDIOVASCULAR: S1 and S2. SB rate and rhythm. No murmurs, rubs, or gallops.  GASTROINTESTINAL: Soft, nontender, -distended. No masses. Positive bowel sounds. No hepatosplenomegaly.  MUSCULOSKELETAL: No swelling, clubbing, or edema.  NEUROLOGIC: lethargic but arousble no focal deficits SKIN:intact,warm,dry    CBC  Recent Labs Lab 01/14/17 0749  WBC 12.7*  HGB 11.7*  HCT 36.6  PLT 127*   Coag's No results for input(s): APTT, INR in the last 168 hours. BMET  Recent Labs Lab 01/14/17 0749  NA 136  K >7.5*  CL 91*  CO2 29  BUN 71*  CREATININE 6.75*  GLUCOSE 585*   Electrolytes  Recent Labs Lab 01/14/17 0749  CALCIUM 7.3*   Sepsis Markers  Recent Labs Lab 01/14/17 0749 01/14/17 1002  LATICACIDVEN 4.9* 4.6*  PROCALCITON 0.32  --    ABG  Recent Labs Lab 01/14/17 0813  PHART 7.26*  PCO2ART 52*  PO2ART 71*   Liver Enzymes No results for input(s): AST, ALT, ALKPHOS, BILITOT, ALBUMIN in the last 168 hours. Cardiac Enzymes  Recent Labs Lab 01/14/17 0749  TROPONINI <0.03   Glucose  Recent Labs Lab 01/14/17 1131  GLUCAP 433*     ASSESSMENT / PLAN: 61 yo hispanic female with ESRD on HD admitted for acute life  threatening bradyarrhythmia from hyperkalemia   PULMONARY Oxygen as needed  CARDIOVASCULAR continue dopamine infusion ICU monitoring  RENAL Will need Emergent HD  GASTROINTESTINAL zofran as needed Keep NPO  HEMATOLOGIC Follow CBC  INFECTIOUS No signs of infection at this time  ENDOCRINE Follow FSBS  NEUROLOGIC Lethargy but no focal deficits  FAMILY  - Updates: at bedside updated    I have personally obtained a history, examined the patient, evaluated Pertinent laboratory and RadioGraphic/imaging results, and  formulated the assessment and plan   The Patient requires high complexity decision making for assessment and support, frequent evaluation and titration of therapies, application of advanced monitoring technologies and extensive interpretation of multiple databases. Critical Care Time devoted to patient care services described in this note is 55 minutes.   Overall, patient is critically ill, prognosis is guarded.  Patient with Multiorgan failure and at high risk for cardiac arrest and death.  I assume that once patient gets her HD, she will improve  Corrin Parker, M.D.  [facility]  Medical Director [place] Director [facility] Cardio-Pulmonary Department

## 2017-01-14 NOTE — Progress Notes (Addendum)
1200 Admitted to ICU via stretcher. Patient was alert with questionable orientation. Heart rate in the 40s.. Dr.Kollaru notified of the low heart rate. 1205 started on Dopamine IV at 78mcg/kg/min. B/P improved but heart rate remained the same rate of 36 or below. B/P quickly skyrocketed to 209/ 90. Dopamin tirated down and was turned off due to the high B/P. Heart rate still 36 so at 1215  Dr. Juanell Fairly ordered .5 of atropine ivp. D50 and insulin given per orders of Dr. Rolly Salter 9969 emergent dialysis done. 2liters of fluid were pulled. Heart rate  now in the 70s. Patient is now confused. No neuro deficiets noted. Dr. Rolly Salter notified. Repeat K+ is greater than 7.5.

## 2017-01-14 NOTE — ED Notes (Addendum)
1 amp, 50 meq bicarb given IV per orders from dr Jimmye Norman.

## 2017-01-15 ENCOUNTER — Inpatient Hospital Stay: Payer: Self-pay

## 2017-01-15 DIAGNOSIS — R001 Bradycardia, unspecified: Secondary | ICD-10-CM

## 2017-01-15 LAB — BASIC METABOLIC PANEL
ANION GAP: 13 (ref 5–15)
Anion gap: 11 (ref 5–15)
BUN: 26 mg/dL — ABNORMAL HIGH (ref 6–20)
BUN: 33 mg/dL — AB (ref 6–20)
CHLORIDE: 96 mmol/L — AB (ref 101–111)
CHLORIDE: 95 mmol/L — AB (ref 101–111)
CO2: 29 mmol/L (ref 22–32)
CO2: 31 mmol/L (ref 22–32)
CREATININE: 5.08 mg/dL — AB (ref 0.44–1.00)
Calcium: 7.9 mg/dL — ABNORMAL LOW (ref 8.9–10.3)
Calcium: 7.9 mg/dL — ABNORMAL LOW (ref 8.9–10.3)
Creatinine, Ser: 4.03 mg/dL — ABNORMAL HIGH (ref 0.44–1.00)
GFR calc Af Amer: 10 mL/min — ABNORMAL LOW (ref >60)
GFR calc non Af Amer: 8 mL/min — ABNORMAL LOW (ref >60)
GFR, EST AFRICAN AMERICAN: 13 mL/min — AB (ref >60)
GFR, EST NON AFRICAN AMERICAN: 11 mL/min — AB (ref >60)
Glucose, Bld: 98 mg/dL (ref 65–99)
Glucose, Bld: 220 mg/dL — ABNORMAL HIGH (ref 65–99)
POTASSIUM: 5.3 mmol/L — AB (ref 3.5–5.1)
POTASSIUM: 5.4 mmol/L — AB (ref 3.5–5.1)
SODIUM: 138 mmol/L (ref 135–145)
Sodium: 137 mmol/L (ref 135–145)

## 2017-01-15 LAB — CBC
HCT: 32.9 % — ABNORMAL LOW (ref 35.0–47.0)
Hemoglobin: 11.3 g/dL — ABNORMAL LOW (ref 12.0–16.0)
MCH: 30.3 pg (ref 26.0–34.0)
MCHC: 34.4 g/dL (ref 32.0–36.0)
MCV: 88.1 fL (ref 80.0–100.0)
PLATELETS: 128 10*3/uL — AB (ref 150–440)
RBC: 3.73 MIL/uL — ABNORMAL LOW (ref 3.80–5.20)
RDW: 17.4 % — ABNORMAL HIGH (ref 11.5–14.5)
WBC: 11.1 10*3/uL — ABNORMAL HIGH (ref 3.6–11.0)

## 2017-01-15 LAB — HEPATITIS B SURFACE ANTIGEN: Hepatitis B Surface Ag: NEGATIVE

## 2017-01-15 LAB — HEPATITIS B CORE ANTIBODY, TOTAL: Hep B Core Total Ab: NEGATIVE

## 2017-01-15 LAB — HEPATITIS B SURFACE ANTIBODY,QUALITATIVE: Hep B S Ab: NONREACTIVE

## 2017-01-15 LAB — GLUCOSE, CAPILLARY
GLUCOSE-CAPILLARY: 102 mg/dL — AB (ref 65–99)
GLUCOSE-CAPILLARY: 259 mg/dL — AB (ref 65–99)
GLUCOSE-CAPILLARY: 100 mg/dL — AB (ref 65–99)
Glucose-Capillary: 63 mg/dL — ABNORMAL LOW (ref 65–99)
Glucose-Capillary: 153 mg/dL — ABNORMAL HIGH (ref 65–99)

## 2017-01-15 LAB — MAGNESIUM: MAGNESIUM: 1.9 mg/dL (ref 1.7–2.4)

## 2017-01-15 LAB — PROCALCITONIN: PROCALCITONIN: 1.52 ng/mL

## 2017-01-15 MED ORDER — METOPROLOL TARTRATE 25 MG PO TABS
25.0000 mg | ORAL_TABLET | Freq: Two times a day (BID) | ORAL | Status: DC
  Administered 2017-01-15 (×2): 25 mg via ORAL
  Filled 2017-01-15 (×2): qty 1

## 2017-01-15 MED ORDER — HYDRALAZINE HCL 25 MG PO TABS
25.0000 mg | ORAL_TABLET | Freq: Three times a day (TID) | ORAL | Status: DC
  Administered 2017-01-15 – 2017-01-17 (×4): 25 mg via ORAL
  Filled 2017-01-15 (×5): qty 1

## 2017-01-15 MED ORDER — ATORVASTATIN CALCIUM 20 MG PO TABS
40.0000 mg | ORAL_TABLET | Freq: Every day | ORAL | Status: DC
  Administered 2017-01-15 – 2017-01-16 (×2): 40 mg via ORAL
  Filled 2017-01-15 (×2): qty 2

## 2017-01-15 NOTE — Progress Notes (Signed)
[facility]  ROUNDING NOTE   Subjective:   History taken with assistance of Spanish Interpreter.  Family at bedside.  Emergent hemodialysis yesterday for hyperkalemia. Tolerated treatment well. UF of 2 litres. 2K bath.   Patient reports eating bananas  Objective:  Vital signs in last 24 hours:  Temp:  [97.9 F (36.6 C)-98.2 F (36.8 C)] 97.9 F (36.6 C) (02/06 2030) Pulse Rate:  [38-100] 70 (02/07 0900) Resp:  [0-26] 16 (02/07 0900) BP: (98-224)/(35-82) 186/55 (02/07 0900) SpO2:  [87 %-100 %] 96 % (02/07 0900) Weight:  [45.3 kg (99 lb 13.9 oz)-51.1 kg (112 lb 10.5 oz)] 51.1 kg (112 lb 10.5 oz) (02/07 0412)  Weight change: -0.06 kg (-2.1 oz) Filed Weights   01/14/17 0659 01/14/17 1425 01/15/17 0412  Weight: 45.4 kg (100 lb) 45.3 kg (99 lb 13.9 oz) 51.1 kg (112 lb 10.5 oz)    Intake/Output: I/O last 3 completed shifts: In: -  Out: 2000 [Other:2000]   Intake/Output this shift:  No intake/output data recorded.  Physical Exam: General: NAD, laying in bed  Head: Normocephalic, atraumatic. Moist oral mucosal membranes  Eyes: Anicteric, PERRL  Neck: Supple, trachea midline  Lungs:  Clear to auscultation  Heart: Regular rate and rhythm  Abdomen:  Soft, nontender,   Extremities: no peripheral edema.  Neurologic: Nonfocal, moving all four extremities  Skin: No lesions  Access: RIJ permcath, maturing Right AVF    Basic Metabolic Panel:  Recent Labs Lab 01/14/17 0749 01/14/17 1444 01/14/17 1856 01/15/17 0416  NA 136 133* 139 138  K >7.5* >7.5* 4.2 5.3*  CL 91* 92* 96* 96*  CO2 29 27 30 29   GLUCOSE 585* 417* 52* 98  BUN 71* 77* 19 26*  CREATININE 6.75* 7.38* 3.06* 4.03*  CALCIUM 7.3* 8.4* 9.1 7.9*  MG  --   --   --  1.9    Liver Function Tests: No results for input(s): AST, ALT, ALKPHOS, BILITOT, PROT, ALBUMIN in the last 168 hours.  Recent Labs Lab 01/14/17 0749  LIPASE 29   No results for input(s): AMMONIA in the last 168  hours.  CBC:  Recent Labs Lab 01/14/17 0749 01/15/17 0416  WBC 12.7* 11.1*  HGB 11.7* 11.3*  HCT 36.6 32.9*  MCV 93.0 88.1  PLT 127* 128*    Cardiac Enzymes:  Recent Labs Lab 01/14/17 0749  TROPONINI <0.03    BNP: Invalid input(s): POCBNP  CBG:  Recent Labs Lab 01/14/17 1359 01/14/17 1625 01/14/17 2012 01/14/17 2051 01/15/17 0729  GLUCAP 423* 112* 80* 57 102*    Microbiology: Results for orders placed or performed during the hospital encounter of 01/14/17  Blood culture (routine x 2)     Status: None (Preliminary result)   Collection Time: 01/14/17  8:54 AM  Result Value Ref Range Status   Specimen Description BLOOD L HAND  Final   Special Requests BOTTLES DRAWN AEROBIC AND ANAEROBIC BCAV  Final   Culture NO GROWTH < 24 HOURS  Final   Report Status PENDING  Incomplete  MRSA PCR Screening     Status: None   Collection Time: 01/14/17 12:25 PM  Result Value Ref Range Status   MRSA by PCR NEGATIVE NEGATIVE Final    Comment:        The GeneXpert MRSA Assay (FDA approved for NASAL specimens only), is one component of a comprehensive MRSA colonization surveillance program. It is not intended to diagnose MRSA infection nor to guide or monitor treatment for MRSA infections.  Blood culture (routine x 2)     Status: None (Preliminary result)   Collection Time: 01/14/17 12:39 PM  Result Value Ref Range Status   Specimen Description BLOOD LEFT HAND  Final   Special Requests BOTTLES DRAWN AEROBIC AND ANAEROBIC BCAV  Final   Culture NO GROWTH < 24 HOURS  Final   Report Status PENDING  Incomplete    Coagulation Studies: No results for input(s): LABPROT, INR in the last 72 hours.  Urinalysis: No results for input(s): COLORURINE, LABSPEC, PHURINE, GLUCOSEU, HGBUR, BILIRUBINUR, KETONESUR, PROTEINUR, UROBILINOGEN, NITRITE, LEUKOCYTESUR in the last 72 hours.  Invalid input(s): APPERANCEUR    Imaging: Ct Head Wo Contrast  Result Date: 01/14/2017 CLINICAL  DATA:  Bradycardia followed by hypertension.  Confusion. EXAM: CT HEAD WITHOUT CONTRAST TECHNIQUE: Contiguous axial images were obtained from the base of the skull through the vertex without intravenous contrast. COMPARISON:  05/21/2006 CT FINDINGS: BRAIN: The ventricles and sulci appear expected for age. No intraparenchymal hemorrhage, mass effect nor midline shift. No acute large vascular territory infarcts. Grey-white matter distinction is maintained. The basal ganglia are unremarkable. No abnormal extra-axial fluid collections. Basal cisterns are not effaced and midline. The brainstem and cerebellar hemispheres are without acute abnormalities. VASCULAR: No hyperdense vessel or unexpected calcification. SKULL/SOFT TISSUES: No skull fracture. No significant soft tissue swelling. ORBITS/SINUSES: The included ocular globes and orbital contents are normal.The mastoid air cells are clear. The included paranasal sinuses are well-aerated. OTHER: None. IMPRESSION: No acute intracranial abnormality. Electronically Signed   By: Ashley Royalty M.D.   On: 01/14/2017 22:16   Dg Chest Port 1 View  Result Date: 01/15/2017 CLINICAL DATA:  Dyspnea EXAM: PORTABLE CHEST 1 VIEW COMPARISON:  05/21/2016 FINDINGS: Right-sided dialysis central venous catheter with tip over the cavoatrial junction region. No pneumothorax. Shallow inspiration. Normal heart size and pulmonary vascularity. Lungs are clear. No blunting of costophrenic angles. IMPRESSION: No active disease. Electronically Signed   By: Lucienne Capers M.D.   On: 01/15/2017 04:44     Medications:    . heparin  5,000 Units Subcutaneous Q8H  . insulin aspart  0-5 Units Subcutaneous QHS  . insulin aspart  0-9 Units Subcutaneous TID WC  . insulin glargine  10 Units Subcutaneous Once     Assessment/ Plan:  [name] is a 61 y.o. 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   [facility]  1. ESRD on HD TTS: with hyperkalemia.  - emergent hemodialysis on admission.  - Resume TTS schedule.   2. Anemia of chronic kidney disease: Hemoglobin 11.7 - hold epo  3. Secondary hyperparathyroidism. With hypocalcemia - calcium acetate with meals.   4. Hypertension: elevated blood pressures. Currently off all her blood pressure agents. Home regimen of losartan, amlodipine and hydralazine. Do not recommend restarting losartan at this time.    LOS: 1 Sylvie Mifsud 2/7/201811:10 AM

## 2017-01-15 NOTE — Evaluation (Signed)
Occupational Therapy Evaluation Patient Details Name: Anita Contreras MRN: [id number] DOB: Jul 29, 1956 Today's Date: 01/15/2017    History of Present Illness Patient is a 61 y/o female that presents with elevated K+, lactic acidosis, elevated blood sugat levels. She had multiple syncopal episodes during her last PT consult at this facility. She reports these occur at home "when she sweats a lot". She is Spanish speaking only, interpreter present throughout evaluation.    Clinical Impression   Pt is 61 year old female who presents with elevated K+, lactic acidosis and elevated blood sugar levels (see above for presenting problems).  Adan Sis was used for [employer] throughout evaluation.  Her grand daughter Vicente Males was also present. Pt was sitting on toilet with NSG upon OTs arrival and was not able to urinate but was able to complete toileting and pull up Depends without assist, only supervision. Pt has tingling and tightness in R hand but is able to use for all ADLs.  She is L hand dominant and able to transfer from standing to sitting and then to recliner with only supervision. Pt appears to be at baseline for ADLs and seen for evaluation only. Myra from [place] updated about status and that her L arm from elbow to hand is more red compared to R which NSG said was from Dopamine yesterday and is improving.  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  Rec scanning using her R eye and moving her head more to compensate for decreased vision and supervision at home when showering or doing any tasks outside of the home to prevent falls.  Also rec a shower chair to sit on when bathing to prevent falls.  No further OT services needed.     Follow Up Recommendations  No OT follow up    Equipment Recommendations       Recommendations for Other Services       Precautions / Restrictions  Precautions Precautions: Fall Precaution Comments: interpretor Hiram Cutie used for [employer] Weight Bearing Restrictions: No      Mobility Bed Mobility Overal bed mobility: Independent             General bed mobility comments: No deficits identified in bed mobility.   Transfers Overall transfer level: Needs assistance Equipment used: Rolling walker (2 wheeled) Transfers: Sit to/from Stand Sit to Stand: Supervision         General transfer comment: Swift, smooth transfer performed with no loss of balance.     Balance Overall balance assessment: Needs assistance Sitting-balance support: No upper extremity supported Sitting balance-Leahy Scale: Good     Standing balance support: Bilateral upper extremity supported Standing balance-Leahy Scale: Good                              ADL Overall ADL's : At baseline                                       General ADL Comments: Pt is able to ambulate to use toilet in room with supervision only and able to complete feeding, grooming and dressing skills sitting EOB with only supervision.  C/o tingling in R hand with minimal tightness with flexion of hand but able to use for all  tasks.       Vision     Perception     Praxis      Pertinent Vitals/Pain Pain Assessment: No/denies pain     Hand Dominance Left   Extremity/Trunk Assessment Upper Extremity Assessment Upper Extremity Assessment: Overall WFL for tasks assessed   Lower Extremity Assessment Lower Extremity Assessment: Defer to PT evaluation       Communication Communication Communication: Interpreter utilized;Prefers language other than English   Cognition Arousal/Alertness: Awake/alert Behavior During Therapy: WFL for tasks assessed/performed Overall Cognitive Status: Within Functional Limits for tasks assessed                     General Comments       Exercises       Shoulder Instructions       Home Living Family/patient expects to be discharged to:: Private residence Living Arrangements: Other relatives (Sister and grand daughter) Available Help at Discharge: Family Type of Home: Mobile home Home Access: Stairs to enter Technical brewer of Steps: 3 Entrance Stairs-Rails: Can reach both Home Layout: One level     Bathroom Shower/Tub: Tub/shower unit Shower/tub characteristics: Architectural technologist: Standard Bathroom Accessibility: No   Home Equipment: Environmental consultant - 2 wheels          Prior Functioning/Environment Level of Independence: Needs assistance  Gait / Transfers Assistance Needed: Pt reports limited community ambulation without assistive device. (sounds like more recently she has been using a RW)  ADL's / Homemaking Assistance Needed: Independent with ADLs, cooks but needs daughter to pick up her groceries            OT Problem List:     OT Treatment/Interventions:      OT Goals(Current goals can be found in the care plan section) Acute Rehab OT Goals Patient Stated Goal: To return home   OT Frequency:     Barriers to D/C:            Co-evaluation              End of Session    Activity Tolerance: Patient tolerated treatment well Patient left: in bed;with call bell/phone within reach;with family/visitor present;Other (comment) (phlebotomist present to take blood)   Time: 1530-1600 OT Time Calculation (min): 30 min Charges:  OT General Charges $OT Visit: 1 Procedure OT Evaluation $OT Eval Low Complexity: 1 Procedure OT Treatments $Self Care/Home Management : 8-22 mins G-Codes:    Chrys Racer, OTR/L ascom [phone] 01/15/17, 4:23 PM

## 2017-01-15 NOTE — Care Management (Signed)
I spoke with patient's granddaughter that speaks [place]. She states her grandmother has a rolling walker donated at last hospitalization. Patient however is not on home O2. RNCM to follow.

## 2017-01-15 NOTE — Progress Notes (Signed)
[facility] NOTE  Pharmacy Consult for glucose management  Indication: hyperglycemia   No Known Allergies  Patient Measurements: Height: 4\' 5"  (134.6 cm) Weight: 112 lb 10.5 oz (51.1 kg) IBW/kg (Calculated) : 29.4   Vital Signs: Temp: 98.4 F (36.9 C) (02/07 1232) BP: 148/55 (02/07 1232) Pulse Rate: 68 (02/07 1232) Intake/Output from previous day: 02/06 0701 - 02/07 0700 In: -  Out: 2000  Intake/Output from this shift: No intake/output data recorded.  Labs:  Recent Labs  01/14/17 0749 01/14/17 1444 01/14/17 1856 01/15/17 0416  WBC 12.7*  --   --  11.1*  HGB 11.7*  --   --  11.3*  HCT 36.6  --   --  32.9*  PLT 127*  --   --  128*  CREATININE 6.75* 7.38* 3.06* 4.03*  MG  --   --   --  1.9   Estimated Creatinine Clearance: 8.9 mL/min (by C-G formula based on SCr of 4.03 mg/dL (H)).   Microbiology: Recent Results (from the past 720 hour(s))  Blood culture (routine x 2)     Status: None (Preliminary result)   Collection Time: 01/14/17  8:54 AM  Result Value Ref Range Status   Specimen Description BLOOD L HAND  Final   Special Requests BOTTLES DRAWN AEROBIC AND ANAEROBIC BCAV  Final   Culture NO GROWTH < 24 HOURS  Final   Report Status PENDING  Incomplete  MRSA PCR Screening     Status: None   Collection Time: 01/14/17 12:25 PM  Result Value Ref Range Status   MRSA by PCR NEGATIVE NEGATIVE Final    Comment:        The GeneXpert MRSA Assay (FDA approved for NASAL specimens only), is one component of a comprehensive MRSA colonization surveillance program. It is not intended to diagnose MRSA infection nor to guide or monitor treatment for MRSA infections.   Blood culture (routine x 2)     Status: None (Preliminary result)   Collection Time: 01/14/17 12:39 PM  Result Value Ref Range Status   Specimen Description BLOOD LEFT HAND  Final   Special Requests BOTTLES DRAWN AEROBIC AND ANAEROBIC BCAV  Final   Culture NO GROWTH < 24 HOURS  Final   Report Status PENDING  Incomplete    Medical History: Past Medical History:  Diagnosis Date  . Acute cystitis with hematuria   . Anemia   . Cataracts, bilateral   . CKD (chronic kidney disease)   . Diabetic retinopathy, background ([place])   . Dialysis patient ([place])    Tues, Thurs, Sat  . Dizziness   . Hyperlipidemia   . Hypertension   . Migraine variant   . Neuropathy ([place])   . OP (osteoporosis)   . Personal history of noncompliance with medical treatment, presenting hazards to health   . Steal syndrome as complication of dialysis access ([place])   . Stroke ([place])   . Type II diabetes mellitus with ophthalmic manifestations, uncontrolled ([place])   . Vitamin D deficiency     Assessment: 61yo female presenting with emergent need for HD. Glucose on admission of 585.    Plan:  Glucose 153 this AM. Patient has only required 20units of aspart. Current orders for Lantus 19units and SSI.   Loree Fee, PharmD 01/15/2017,1:14 PM

## 2017-01-15 NOTE — Progress Notes (Signed)
Transfer from ICU, admitted to ICU for hyperkalemia, received emergency hemodialysis. Patient is better now, hospitalists will resume the service from tomorrow.

## 2017-01-15 NOTE — Progress Notes (Addendum)
* [facility]     Assessment and Plan:  Acute hyperkalemia with EKG changes, bradycardia. ESRD.  -Appears improved/resolved with emergent dialysis.Continue HD per nephro.  -Continue telemetry monitoring, monitor potassium, we'll repeat dialysis if needed per nephrology recommendations. -Patient can likely be transferred to the general medical floor today.  Confusion/delirium. -Likely secondary to above, appears improved, CT head unremarkable.  Essential hypertension.  --Restart metoprolol, hydralazine.  --Hold losartan secondary to hyper K. She should probably remain off of this.   Date: 01/15/2017  MRN# [id number] Anita Contreras 1956/05/13   Anita Contreras is a 61 y.o. old female seen in follow up for chief complaint of  Chief Complaint  Patient presents with  . Hyperglycemia     HPI:  Family noted the patient was somewhat confused yesterday, CT head was done overnight, which was unremarkable. Patient was dialyzed yesterday, otherwise doing well.  Allergies:  Patient has no known allergies.  Review of Systems: Gen:  Denies  fever, sweats. HEENT: Denies blurred vision. Cvc:  No dizziness, chest pain or heaviness Resp:   Denies cough or sputum porduction. Gi: Denies swallowing difficulty, stomach pain. constipation, bowel incontinence Gu:  Denies bladder incontinence, burning urine Ext:   No Joint pain, stiffness. Skin: No skin rash, easy bruising. Endoc:  No polyuria, polydipsia. Psych: No depression, insomnia. Other:  All other systems were reviewed and found to be negative other than what is mentioned in the HPI.   Physical Examination:   VS: BP (!) 142/56   Pulse 67   Temp 97.9 F (36.6 C) (Oral)   Resp 16   Ht 4\' 5"  (1.346 m)   Wt 51.1 kg (112 lb 10.5 oz)   LMP 09/19/2001 (Approximate)   SpO2 (!) 87%   BMI 28.20 kg/m   General Appearance: No distress  Neuro:without focal findings,  speech normal,  HEENT: PERRLA,  EOM intact. Pulmonary: normal breath sounds, No wheezing.   CardiovascularNormal S1,S2.  No m/r/g.   Abdomen: Benign, Soft, non-tender. Renal:  No costovertebral tenderness  GU:  Not performed at this time. Endoc: No evident thyromegaly, no signs of acromegaly. Skin:   warm, no rash. Extremities: normal, no cyanosis, clubbing.   LABORATORY PANEL:   CBC  Recent Labs Lab 01/15/17 0416  WBC 11.1*  HGB 11.3*  HCT 32.9*  PLT 128*   ------------------------------------------------------------------------------------------------------------------  Chemistries   Recent Labs Lab 01/15/17 0416  NA 138  K 5.3*  CL 96*  CO2 29  GLUCOSE 98  BUN 26*  CREATININE 4.03*  CALCIUM 7.9*  MG 1.9   ------------------------------------------------------------------------------------------------------------------  Cardiac Enzymes  Recent Labs Lab 01/14/17 0749  TROPONINI <0.03   ------------------------------------------------------------  RADIOLOGY:   No results found for this or any previous visit. Results for orders placed during the hospital encounter of 05/21/16  DG Chest 2 View   Narrative CLINICAL DATA:  Headache, weakness, diaphoresis and hypoglycemia beginning this afternoon.  EXAM: CHEST  2 VIEW  COMPARISON:  Chest x-rays dated 04/29/2016 and 04/23/2016.  FINDINGS: Right-sided central catheter is stable in position with tip at the level the right atrium. Mild cardiomegaly is stable. Overall cardiomediastinal silhouette is stable in size and configuration.  Mild central pulmonary vascular congestion and mild bilateral interstitial edema persists. Probable mild atelectasis at each lung base. Probable small bilateral pleural effusions. No large pleural effusion. No pneumothorax seen. Osseous structures about the chest are unremarkable.  IMPRESSION: 1. Stable mild cardiomegaly with central pulmonary vascular congestion and interstitial edema suggesting mild  volume overload/CHF. This appearance is not significantly changed compared to the previous chest x-ray of 04/29/2016. 2. Probable mild atelectasis and/or small pleural effusions at each lung base. This also appears stable. 3. No new lung findings. No large pleural effusion. No evidence of pneumonia.   Electronically Signed   By: Franki Cabot M.D.   On: 05/21/2016 21:25    ------------------------------------------------------------------------------------------------------------------  Thank  you for allowing [facility] to assist in the care of your patient. Our recommendations are noted above.  Please contact us if we can be of further service.   Marda Stalker, MD.  [facility] Office Number: [phone]  Patricia Pesa, M.D.  Vilinda Boehringer, M.D.  Merton Border, M.D  01/15/2017

## 2017-01-15 NOTE — Evaluation (Signed)
Physical Therapy Evaluation Patient Details Name: Anita Contreras MRN: [id number] DOB: 1956-07-01 Today's Date: 01/15/2017   History of Present Illness  Patient is a 61 y/o female that presents with elevated K+, lactic acidosis, elevated blood sugat levels. She had multiple syncopal episodes during her last PT consult at this facility. She reports these occur at home "when she sweats a lot". She is Spanish speaking only, interpreter present throughout evaluation.   Clinical Impression  Patient seen in ED for "feeling bad" noted to be in severe kyperkalemia, received emergent dialysis, reports she is feeling much better now. She is able to ambulate around CCU on 2L of O2 with RW with no loss of balance, excellent gait speed, no shortness of breath. She tends to drift, though this is unclear if due to cognition or side preference. She appears back to her baseline level of mobility.     Follow Up Recommendations No PT follow up    Equipment Recommendations  Rolling walker with 5" wheels (Need to make sure she does have and use RW at home)    Recommendations for Other Services       Precautions / Restrictions Precautions Precautions: Fall Restrictions Weight Bearing Restrictions: No      Mobility  Bed Mobility Overal bed mobility: Independent             General bed mobility comments: No deficits identified in bed mobility.   Transfers Overall transfer level: Needs assistance Equipment used: Rolling walker (2 wheeled) Transfers: Sit to/from Stand Sit to Stand: Supervision         General transfer comment: Swift, smooth transfer performed with no loss of balance.   Ambulation/Gait Ambulation/Gait assistance: Min guard Ambulation Distance (Feet): 225 Feet Assistive device: Rolling walker (2 wheeled) Gait Pattern/deviations: WFL(Within Functional Limits)   Gait velocity interpretation: <1.8 ft/sec, indicative of risk for recurrent falls General Gait Details:  Patient noted to weave R and L with RW, unclear if due to side preference, confusion, or difficulty controlling RW. No loss of balance otherwise.   Stairs            Wheelchair Mobility    Modified Rankin (Stroke Patients Only)       Balance Overall balance assessment: Needs assistance Sitting-balance support: No upper extremity supported Sitting balance-Leahy Scale: Good     Standing balance support: Bilateral upper extremity supported Standing balance-Leahy Scale: Good                               Pertinent Vitals/Pain Pain Assessment: No/denies pain    Home Living Family/patient expects to be discharged to:: Private residence Living Arrangements: Alone Available Help at Discharge: Family Type of Home: Mobile home Home Access: Stairs to enter Entrance Stairs-Rails: Can reach both Entrance Stairs-Number of Steps: 3 Home Layout: One level Home Equipment: Walker - 2 wheels      Prior Function Level of Independence: Needs assistance   Gait / Transfers Assistance Needed: Pt reports limited community ambulation without assistive device. (sounds like more recently she has been using a RW)   ADL's / Homemaking Assistance Needed: Independent with ADLs, cooks but needs daughter to pick up her groceries        Hand Dominance        Extremity/Trunk Assessment   Upper Extremity Assessment Upper Extremity Assessment: Overall WFL for tasks assessed    Lower Extremity Assessment Lower Extremity Assessment: Overall WFL for tasks assessed  Communication   Communication: Interpreter utilized;Prefers language other than English  Cognition Arousal/Alertness: Awake/alert Behavior During Therapy: WFL for tasks assessed/performed Overall Cognitive Status: Within Functional Limits for tasks assessed                      General Comments      Exercises     Assessment/Plan    PT Assessment Patient needs continued PT services  PT  Problem List Decreased strength;Decreased mobility;Cardiopulmonary status limiting activity;Decreased activity tolerance;Decreased balance;Decreased safety awareness          PT Treatment Interventions DME instruction;Gait training;Stair training;Balance training;Therapeutic exercise;Therapeutic activities;Neuromuscular re-education    PT Goals (Current goals can be found in the Care Plan section)  Acute Rehab PT Goals Patient Stated Goal: To return home  PT Goal Formulation: With patient Time For Goal Achievement: 01/29/17 Potential to Achieve Goals: Good    Frequency Min 2X/week   Barriers to discharge        Co-evaluation               End of Session Equipment Utilized During Treatment: Gait belt;Oxygen Activity Tolerance: Patient tolerated treatment well Patient left: in bed;with bed alarm set;with call bell/phone within reach;with family/visitor present;with nursing/sitter in room Nurse Communication: Mobility status         Time: 1000-1015 PT Time Calculation (min) (ACUTE ONLY): 15 min   Charges:   PT Evaluation $PT Eval Moderate Complexity: 1 Procedure     PT G Codes:       Royce Macadamia PT, DPT, CSCS    01/15/2017, 2:20 PM

## 2017-01-16 LAB — CBC
HCT: 34.8 % — ABNORMAL LOW (ref 35.0–47.0)
Hemoglobin: 11.9 g/dL — ABNORMAL LOW (ref 12.0–16.0)
MCH: 30.9 pg (ref 26.0–34.0)
MCHC: 34.3 g/dL (ref 32.0–36.0)
MCV: 90 fL (ref 80.0–100.0)
PLATELETS: 133 10*3/uL — AB (ref 150–440)
RBC: 3.86 MIL/uL (ref 3.80–5.20)
RDW: 16.6 % — AB (ref 11.5–14.5)
WBC: 8.5 10*3/uL (ref 3.6–11.0)

## 2017-01-16 LAB — BASIC METABOLIC PANEL
Anion gap: 10 (ref 5–15)
BUN: 43 mg/dL — AB (ref 6–20)
CALCIUM: 7.8 mg/dL — AB (ref 8.9–10.3)
CO2: 30 mmol/L (ref 22–32)
CREATININE: 5.47 mg/dL — AB (ref 0.44–1.00)
Chloride: 97 mmol/L — ABNORMAL LOW (ref 101–111)
GFR calc non Af Amer: 8 mL/min — ABNORMAL LOW (ref >60)
GFR, EST AFRICAN AMERICAN: 9 mL/min — AB (ref >60)
GLUCOSE: 123 mg/dL — AB (ref 65–99)
Potassium: 5.5 mmol/L — ABNORMAL HIGH (ref 3.5–5.1)
Sodium: 137 mmol/L (ref 135–145)

## 2017-01-16 LAB — GLUCOSE, CAPILLARY
GLUCOSE-CAPILLARY: 140 mg/dL — AB (ref 65–99)
GLUCOSE-CAPILLARY: 225 mg/dL — AB (ref 65–99)
Glucose-Capillary: 120 mg/dL — ABNORMAL HIGH (ref 65–99)
Glucose-Capillary: 239 mg/dL — ABNORMAL HIGH (ref 65–99)

## 2017-01-16 LAB — TSH: TSH: 1.751 u[IU]/mL (ref 0.350–4.500)

## 2017-01-16 LAB — PHOSPHORUS: PHOSPHORUS: 4.4 mg/dL (ref 2.5–4.6)

## 2017-01-16 LAB — PROCALCITONIN: Procalcitonin: 1.58 ng/mL

## 2017-01-16 MED ORDER — CALCIUM ACETATE (PHOS BINDER) 667 MG PO CAPS
667.0000 mg | ORAL_CAPSULE | Freq: Three times a day (TID) | ORAL | Status: DC
  Administered 2017-01-16 – 2017-01-17 (×3): 667 mg via ORAL
  Filled 2017-01-16 (×3): qty 1

## 2017-01-16 MED ORDER — PNEUMOCOCCAL VAC POLYVALENT 25 MCG/0.5ML IJ INJ
0.5000 mL | INJECTION | INTRAMUSCULAR | Status: AC
  Administered 2017-01-17: 0.5 mL via INTRAMUSCULAR
  Filled 2017-01-16: qty 0.5

## 2017-01-16 NOTE — Progress Notes (Signed)
Inpatient Diabetes Program Recommendations  AACE/ADA: New Consensus Statement on Inpatient Glycemic Control (2015)  Target Ranges:  Prepandial:   less than 140 mg/dL      Peak postprandial:   less than 180 mg/dL (1-2 hours)      Critically ill patients:  140 - 180 mg/dL   Lab Results  Component Value Date   GLUCAP 140 (H) 01/16/2017   HGBA1C 7.2 (H) 04/15/2016    Review of Glycemic Control  Results for BETHZY, HAUCK (MRN [id number]) as of 01/16/2017 08:55  Ref. Range 01/15/2017 12:16 01/15/2017 12:33 01/15/2017 17:38 01/15/2017 21:19 01/16/2017 07:51  Glucose-Capillary Latest Ref Range: 65 - 99 mg/dL 63 (L) 153 (H) 259 (H) 100 (H) 140 (H)    Diabetes history: Type 2 Outpatient Diabetes medications: NPH 70/30 6 units bid Current orders for Inpatient glycemic control: Novolog 0-9 units tid, Novolog 0-5 units qhs  Inpatient Diabetes Program Recommendations:  Consider Levemir 5 units qday and custom correction tid & hs;   CBG 151mg /dl    = 0 units CBG 151-200mg /dl= 1 unit CBG 201-250mg /dl= 2 units CBG 251-300mg /dl= 3 units CBG 301-350mg /dl= 4 units  CBG  >350mg /dl    = 5units    Per ADA recommendations "consider performing an A1C on all patients with diabetes or hyperglycemia admitted to the hospital if not performed in the prior 3 months".   Gentry Fitz, RN, BA, MHA, CDE Diabetes Coordinator Inpatient Diabetes Program  [phone] (Team Pager) [phone] ([place]) 01/16/2017 9:01 AM

## 2017-01-16 NOTE — Progress Notes (Signed)
[facility]  ROUNDING NOTE   Subjective:   Seen and examined on hemodialysis. Tolerating treatment well. UF goal of 2 litres. 2K bath.   History taken with assistance of Spanish interpreter.   Reports syncopal episodes at home.   Objective:  Vital signs in last 24 hours:  Temp:  [98.2 F (36.8 C)-98.6 F (37 C)] 98.2 F (36.8 C) (02/08 0920) Pulse Rate:  [54-68] 67 (02/08 1030) Resp:  [16-20] 16 (02/08 1030) BP: (104-172)/(43-55) 122/49 (02/08 1030) SpO2:  [95 %-100 %] 99 % (02/08 1030) Weight:  [49.6 kg (109 lb 4.8 oz)] 49.6 kg (109 lb 4.8 oz) (02/08 0920)  Weight change: 4.278 kg (9 lb 6.9 oz) Filed Weights   01/15/17 0412 01/16/17 0500 01/16/17 0920  Weight: 51.1 kg (112 lb 10.5 oz) 49.6 kg (109 lb 4.8 oz) 49.6 kg (109 lb 4.8 oz)    Intake/Output: I/O last 3 completed shifts: In: 460 [P.O.:460] Out: -    Intake/Output this shift:  No intake/output data recorded.  Physical Exam: General: NAD, laying in bed  Head: Normocephalic, atraumatic. Moist oral mucosal membranes  Eyes: Anicteric, PERRL  Neck: Supple, trachea midline  Lungs:  Clear to auscultation  Heart: Regular rate and rhythm  Abdomen:  Soft, nontender,   Extremities: no peripheral edema.  Neurologic: Nonfocal, moving all four extremities  Skin: No lesions  Access: RIJ permcath, maturing Right AVF    Basic Metabolic Panel:  Recent Labs Lab 01/14/17 1444 01/14/17 1856 01/15/17 0416 01/15/17 1554 01/16/17 0502  NA 133* 139 138 137 137  K >7.5* 4.2 5.3* 5.4* 5.5*  CL 92* 96* 96* 95* 97*  CO2 27 30 29 31 30   GLUCOSE 417* 52* 98 220* 123*  BUN 77* 19 26* 33* 43*  CREATININE 7.38* 3.06* 4.03* 5.08* 5.47*  CALCIUM 8.4* 9.1 7.9* 7.9* 7.8*  MG  --   --  1.9  --   --     Liver Function Tests: No results for input(s): AST, ALT, ALKPHOS, BILITOT, PROT, ALBUMIN in the last 168 hours.  Recent Labs Lab 01/14/17 0749  LIPASE 29   No results for input(s): AMMONIA in the last 168  hours.  CBC:  Recent Labs Lab 01/14/17 0749 01/15/17 0416 01/16/17 0502  WBC 12.7* 11.1* 8.5  HGB 11.7* 11.3* 11.9*  HCT 36.6 32.9* 34.8*  MCV 93.0 88.1 90.0  PLT 127* 128* 133*    Cardiac Enzymes:  Recent Labs Lab 01/14/17 0749  TROPONINI <0.03    BNP: Invalid input(s): POCBNP  CBG:  Recent Labs Lab 01/15/17 1216 01/15/17 1233 01/15/17 1738 01/15/17 2119 01/16/17 0751  GLUCAP 63* 153* 259* 100* 140*    Microbiology: Results for orders placed or performed during the hospital encounter of 01/14/17  Blood culture (routine x 2)     Status: None (Preliminary result)   Collection Time: 01/14/17  8:54 AM  Result Value Ref Range Status   Specimen Description BLOOD L HAND  Final   Special Requests BOTTLES DRAWN AEROBIC AND ANAEROBIC BCAV  Final   Culture NO GROWTH 2 DAYS  Final   Report Status PENDING  Incomplete  MRSA PCR Screening     Status: None   Collection Time: 01/14/17 12:25 PM  Result Value Ref Range Status   MRSA by PCR NEGATIVE NEGATIVE Final    Comment:        The GeneXpert MRSA Assay (FDA approved for NASAL specimens only), is one component of a comprehensive MRSA colonization surveillance program. It  is not intended to diagnose MRSA infection nor to guide or monitor treatment for MRSA infections.   Blood culture (routine x 2)     Status: None (Preliminary result)   Collection Time: 01/14/17 12:39 PM  Result Value Ref Range Status   Specimen Description BLOOD LEFT HAND  Final   Special Requests BOTTLES DRAWN AEROBIC AND ANAEROBIC BCAV  Final   Culture NO GROWTH 2 DAYS  Final   Report Status PENDING  Incomplete    Coagulation Studies: No results for input(s): LABPROT, INR in the last 72 hours.  Urinalysis: No results for input(s): COLORURINE, LABSPEC, PHURINE, GLUCOSEU, HGBUR, BILIRUBINUR, KETONESUR, PROTEINUR, UROBILINOGEN, NITRITE, LEUKOCYTESUR in the last 72 hours.  Invalid input(s): APPERANCEUR    Imaging: Ct Head Wo  Contrast  Result Date: 01/14/2017 CLINICAL DATA:  Bradycardia followed by hypertension.  Confusion. EXAM: CT HEAD WITHOUT CONTRAST TECHNIQUE: Contiguous axial images were obtained from the base of the skull through the vertex without intravenous contrast. COMPARISON:  05/21/2006 CT FINDINGS: BRAIN: The ventricles and sulci appear expected for age. No intraparenchymal hemorrhage, mass effect nor midline shift. No acute large vascular territory infarcts. Grey-white matter distinction is maintained. The basal ganglia are unremarkable. No abnormal extra-axial fluid collections. Basal cisterns are not effaced and midline. The brainstem and cerebellar hemispheres are without acute abnormalities. VASCULAR: No hyperdense vessel or unexpected calcification. SKULL/SOFT TISSUES: No skull fracture. No significant soft tissue swelling. ORBITS/SINUSES: The included ocular globes and orbital contents are normal.The mastoid air cells are clear. The included paranasal sinuses are well-aerated. OTHER: None. IMPRESSION: No acute intracranial abnormality. Electronically Signed   By: Ashley Royalty M.D.   On: 01/14/2017 22:16   Dg Chest Port 1 View  Result Date: 01/15/2017 CLINICAL DATA:  Dyspnea EXAM: PORTABLE CHEST 1 VIEW COMPARISON:  05/21/2016 FINDINGS: Right-sided dialysis central venous catheter with tip over the cavoatrial junction region. No pneumothorax. Shallow inspiration. Normal heart size and pulmonary vascularity. Lungs are clear. No blunting of costophrenic angles. IMPRESSION: No active disease. Electronically Signed   By: Lucienne Capers M.D.   On: 01/15/2017 04:44     Medications:    . atorvastatin  40 mg Oral q1800  . heparin  5,000 Units Subcutaneous Q8H  . hydrALAZINE  25 mg Oral Q8H  . insulin aspart  0-5 Units Subcutaneous QHS  . insulin aspart  0-9 Units Subcutaneous TID WC  . metoprolol tartrate  25 mg Oral BID     Assessment/ Plan:  [name] is a 61 y.o. 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   [facility]  1. ESRD on HD TTS: with hyperkalemia.  - emergent hemodialysis on admission.  - Resume TTS schedule. Seen and examined on hemodialysis. Tolerating treatment well.   2. Anemia of chronic kidney disease: Hemoglobin  11.9 - hold epo  3. Secondary hyperparathyroidism. With hypocalcemia - calcium acetate with meals.   4. Hypertension: with bradycaria  Home regimen of losartan, amlodipine and hydralazine. Do not recommend restarting losartan at this time.  - Discontinue metoprolol due to bradycardia - Consult cardiology for reported syncope and bradycardia   LOS: 2 Danitza Schoenfeldt 2/8/201810:36 AM

## 2017-01-16 NOTE — Progress Notes (Signed)
Post hd tx 

## 2017-01-16 NOTE — Progress Notes (Signed)
Pre-hd ZM:[id number] line used.

## 2017-01-16 NOTE — Progress Notes (Signed)
Initial Nutrition Assessment  DOCUMENTATION CODES:   Not applicable  INTERVENTION:  -Renal diet education on High Potassium foods provided to pt and pt's family. Interpretor present. Pt currently consumes high potassium diet; discussed appropriate changes in diet. Pt and family receptive to education, adherence likely -Recommend checking phosphorus level  NUTRITION DIAGNOSIS:   Food and nutrition related knowledge deficit related to limited prior education as evidenced by per patient/family report.  GOAL:    Other (Comment) (Pt will verbalize understanding of diet)  MONITOR:   Other (Comment) (Diet adherence)  REASON FOR ASSESSMENT:   Consult Diet education  ASSESSMENT:   61 y.o. Female with PMH of CKD on Dialysis, Uncontrolled DM Type II, HLD, HTN, Presenting with Hyperkalemia    Pt required emergent HD on admission, pt received HD today, intermittent fluid gains appropriate  Pt is Spanish speaking only and visit required an interpreter. Pt cannot read. Family at bedside to assist in collecting history and express understanding of dietary limits and changes.   Pt reports no recent weight loss. Pt reports UBW ~112 lbs. Per chart review shows pretty stable weigh status since October.   Pt reports no change in appetite, but occasionally experiences intermittent N/V and that impacts her appetite. Pt prepares her meals.  Pt reports consuming plantain, rice and meat for breakfast, chicken and broccoli for lunch, pt states sometimes she is hungry in the afternoon and sometimes not. Pt reports snacking on apples, pears, plantains and beans throughout the day. Discussed with pt the foods that are higher in potassium that she needs to limit, such as the plantains and beans.   Pt and family expressed understanding that they will purchase the food on the low potassium list. Pt's questions regarding Na, Phosphorus, and protein were addressed.  Provided pt with three handouts, "List of  Foods Lower in Potassium", "High-Potassium Food List", "CKD Stage V Nutrition Therapy",all in Spanish so family can interpret upon discharge.  Noted phosphorus not checked this admission. Pt reports phosphorus has been wdl and has been told to stop taking phosphorus binder as outpatient.  Labs reviewed; CBG (51-433), K (5.5) Medications reviewed; Zofran PRN, calcium acetate  Diet Order:  Diet renal/carb modified with fluid restriction Diet-HS Snack? Nothing; Room service appropriate? Yes; Fluid consistency: Thin  Skin:  Reviewed, no issues  Last BM:  2/7  Height:   Ht Readings from Last 1 Encounters:  01/14/17 4\' 5"  (1.346 m)    Weight:   Wt Readings from Last 1 Encounters:  01/16/17 109 lb 4.8 oz (49.6 kg)    Ideal Body Weight:  40 kg  BMI:  Body mass index is 27.36 kg/m.  Estimated Nutritional Needs:   Kcal:  1500-1700  Protein:  65-80 grams  Fluid:  1000 mL + urine output  EDUCATION NEEDS:   Education needs addressed  [employer] Ranger Dietetic Intern

## 2017-01-16 NOTE — Progress Notes (Signed)
Pre-hd tx 

## 2017-01-16 NOTE — Progress Notes (Signed)
Post Hemodialysis:Patient completed 3hour treatment with 1.5liters removed.Blood pressure dropped to 90s during treatment,corrected with uf goal break.Heartrate trended from low 50s to 70s,Dr.Kolluru aware.Post treatment Dr.Vaicukute bedside with intrepretor rounding on patient about bradycardia episodes and left arm redness.HD CVC lumens wrapped with gauze/taped.

## 2017-01-16 NOTE — Progress Notes (Signed)
[facility] NAME: Anita Contreras    MR#:  [id number]  DATE OF BIRTH:  Mar 24, 1956  SUBJECTIVE:  CHIEF COMPLAINT:   Chief Complaint  Patient presents with  . Hyperglycemia   The patient is a 61-year-old [place] female with a history significant for history of end-stage renal disease, dialysis Tuesdays, Thursdays, Saturdays, who missed dialysis on Tuesday due to nausea, vomiting episode. On arrival to emergency room, she was noted to be hyperkalemic and was admitted, she underwent urgent dialysis yesterday and one today due to hyperkalemia. She complains of left arm pain. She was noted to be bradycardic, metoprolol was discontinued. No episodes of syncope anymore, although patient had syncope at home prior to arrival.    Review of Systems  Constitutional: Negative for chills, fever and weight loss.  HENT: Negative for congestion.   Eyes: Negative for blurred vision and double vision.  Respiratory: Negative for cough, sputum production, shortness of breath and wheezing.   Cardiovascular: Negative for chest pain, palpitations, orthopnea, leg swelling and PND.  Gastrointestinal: Negative for abdominal pain, blood in stool, constipation, diarrhea, nausea and vomiting.  Genitourinary: Negative for dysuria, frequency, hematuria and urgency.  Musculoskeletal: Positive for joint pain, myalgias and neck pain. Negative for falls.  Neurological: Negative for dizziness, tremors, focal weakness and headaches.  Endo/Heme/Allergies: Does not bruise/bleed easily.  Psychiatric/Behavioral: Negative for depression. The patient does not have insomnia.     VITAL SIGNS: Blood pressure (!) 153/53, pulse 76, temperature 97.9 F (36.6 C), temperature source Oral, resp. rate 20, height 4\' 5"  (1.346 m), weight 49.6 kg (109 lb 4.8 oz), last menstrual period 09/19/2001, SpO2 100 %.  PHYSICAL EXAMINATION:   GENERAL:  61 y.o.-year-old patient  lying in the bed with no acute distress.  EYES: Pupils equal, round, reactive to light and accommodation. No scleral icterus. Extraocular muscles intact.  HEENT: Head atraumatic, normocephalic. Oropharynx and nasopharynx clear.  NECK:  Supple, no jugular venous distention. No thyroid enlargement, no tenderness.  LUNGS: Normal breath sounds bilaterally, no wheezing, rales,rhonchi or crepitation. No use of accessory muscles of respiration.  CARDIOVASCULAR: S1, S2 normal. No murmurs, rubs, or gallops.  ABDOMEN: Soft, nontender, nondistended. Bowel sounds present. No organomegaly or mass.  EXTREMITIES: No pedal edema, cyanosis, or clubbing.  Petechial erythema but no warmth or pain on palpation of entire left arm, fistula is in the right chest NEUROLOGIC: Cranial nerves II through XII are intact. Muscle strength 5/5 in all extremities. Sensation intact. Gait not checked.  PSYCHIATRIC: The patient is alert and oriented x 3.  SKIN: No obvious rash, lesion, or ulcer.   ORDERS/RESULTS REVIEWED:   CBC  Recent Labs Lab 01/14/17 0749 01/15/17 0416 01/16/17 0502  WBC 12.7* 11.1* 8.5  HGB 11.7* 11.3* 11.9*  HCT 36.6 32.9* 34.8*  PLT 127* 128* 133*  MCV 93.0 88.1 90.0  MCH 29.9 30.3 30.9  MCHC 32.1 34.4 34.3  RDW 16.3* 17.4* 16.6*   ------------------------------------------------------------------------------------------------------------------  Chemistries   Recent Labs Lab 01/14/17 1444 01/14/17 1856 01/15/17 0416 01/15/17 1554 01/16/17 0502  NA 133* 139 138 137 137  K >7.5* 4.2 5.3* 5.4* 5.5*  CL 92* 96* 96* 95* 97*  CO2 27 30 29 31 30   GLUCOSE 417* 52* 98 220* 123*  BUN 77* 19 26* 33* 43*  CREATININE 7.38* 3.06* 4.03* 5.08* 5.47*  CALCIUM 8.4* 9.1 7.9* 7.9* 7.8*  MG  --   --  1.9  --   --    ------------------------------------------------------------------------------------------------------------------  estimated creatinine clearance is 6.5 mL/min (by C-G formula based on  SCr of 5.47 mg/dL (H)). ------------------------------------------------------------------------------------------------------------------  Recent Labs  01/16/17 1014  TSH 1.751    Cardiac Enzymes  Recent Labs Lab 01/14/17 0749  TROPONINI <0.03   ------------------------------------------------------------------------------------------------------------------ Invalid input(s): POCBNP ---------------------------------------------------------------------------------------------------------------  RADIOLOGY: Ct Head Wo Contrast  Result Date: 01/14/2017 CLINICAL DATA:  Bradycardia followed by hypertension.  Confusion. EXAM: CT HEAD WITHOUT CONTRAST TECHNIQUE: Contiguous axial images were obtained from the base of the skull through the vertex without intravenous contrast. COMPARISON:  05/21/2006 CT FINDINGS: BRAIN: The ventricles and sulci appear expected for age. No intraparenchymal hemorrhage, mass effect nor midline shift. No acute large vascular territory infarcts. Grey-white matter distinction is maintained. The basal ganglia are unremarkable. No abnormal extra-axial fluid collections. Basal cisterns are not effaced and midline. The brainstem and cerebellar hemispheres are without acute abnormalities. VASCULAR: No hyperdense vessel or unexpected calcification. SKULL/SOFT TISSUES: No skull fracture. No significant soft tissue swelling. ORBITS/SINUSES: The included ocular globes and orbital contents are normal.The mastoid air cells are clear. The included paranasal sinuses are well-aerated. OTHER: None. IMPRESSION: No acute intracranial abnormality. Electronically Signed   By: Ashley Royalty M.D.   On: 01/14/2017 22:16   Dg Chest Port 1 View  Result Date: 01/15/2017 CLINICAL DATA:  Dyspnea EXAM: PORTABLE CHEST 1 VIEW COMPARISON:  05/21/2016 FINDINGS: Right-sided dialysis central venous catheter with tip over the cavoatrial junction region. No pneumothorax. Shallow inspiration. Normal heart size  and pulmonary vascularity. Lungs are clear. No blunting of costophrenic angles. IMPRESSION: No active disease. Electronically Signed   By: Lucienne Capers M.D.   On: 01/15/2017 04:44    EKG:  Orders placed or performed during the hospital encounter of 01/14/17  . ED EKG  . ED EKG    ASSESSMENT AND PLAN:  Active Problems:   Hyperkalemia  #1. Hyperkalemia due to end-stage renal disease, status post 2 hemodialysis sessions, follow potassium level in the morning, appreciate nephrology's input #2. Bradycardia, now off metoprolol, TSH was found to be normal, suspected hyperkalemia related  #3. Nausea, vomiting, resolved, supportive therapy #4. Diabetes mellitus, continue  sliding scale insulin, resume 7030 insulin whenever patient's oral intake improves 5. Malignant essential hypertension, continue hydralazine, advanced doses as needed, now off metoprolol Management plans discussed with the patient, family and they are in agreement.   DRUG ALLERGIES: No Known Allergies  CODE STATUS:     Code Status Orders        Start     Ordered   01/14/17 0922  Full code  Continuous     01/14/17 0925    Code Status History    Date Active Date Inactive Code Status Order ID Comments User Context   11/08/2016  6:08 PM 11/10/2016  5:54 PM Full Code [id number]  Algernon Huxley, MD Inpatient   10/17/2016  1:19 PM 10/17/2016  7:24 PM Full Code [id number]  Algernon Huxley, MD Inpatient   04/23/2016  8:29 PM 05/07/2016  8:01 PM Full Code [id number]  Vaughan Basta, MD Inpatient   04/15/2016  7:18 PM 04/19/2016  4:08 PM Full Code [id number]  Gladstone Lighter, MD Inpatient      TOTAL TIME TAKING CARE OF THIS PATIENT: 40 minutes.  Discussed via Spanish interpreter  Lidiya Reise M.D on 01/16/2017 at 4:01 PM  Between 7am to 6pm - Pager - [phone]  After 6pm go to [email or web address] - password [email or web address] [facility]  Office  [phone]  CC: Primary care physician; WHITE, CHRISTINA  Jerilynn Mages, FNP

## 2017-01-16 NOTE — Progress Notes (Signed)
Hemodialysis treatment started after language line use.

## 2017-01-16 NOTE — Progress Notes (Signed)
Hemodialysis completed. 

## 2017-01-17 ENCOUNTER — Inpatient Hospital Stay (HOSPITAL_COMMUNITY)
Admit: 2017-01-17 | Discharge: 2017-01-17 | Disposition: A | Discharge status: Home / Self Care | Payer: Self-pay | Attending: Internal Medicine | Admitting: Internal Medicine

## 2017-01-17 DIAGNOSIS — E872 Acidosis, unspecified: Secondary | ICD-10-CM

## 2017-01-17 DIAGNOSIS — R079 Chest pain, unspecified: Secondary | ICD-10-CM

## 2017-01-17 DIAGNOSIS — D72829 Elevated white blood cell count, unspecified: Secondary | ICD-10-CM

## 2017-01-17 DIAGNOSIS — I951 Orthostatic hypotension: Secondary | ICD-10-CM

## 2017-01-17 DIAGNOSIS — I10 Essential (primary) hypertension: Secondary | ICD-10-CM

## 2017-01-17 DIAGNOSIS — R55 Syncope and collapse: Secondary | ICD-10-CM

## 2017-01-17 DIAGNOSIS — R001 Bradycardia, unspecified: Secondary | ICD-10-CM

## 2017-01-17 DIAGNOSIS — R112 Nausea with vomiting, unspecified: Secondary | ICD-10-CM

## 2017-01-17 LAB — ECHOCARDIOGRAM COMPLETE
HEIGHTINCHES: 53 in
WEIGHTICAEL: 1660.8 [oz_av]

## 2017-01-17 LAB — GLUCOSE, CAPILLARY
Glucose-Capillary: 129 mg/dL — ABNORMAL HIGH (ref 65–99)
Glucose-Capillary: 195 mg/dL — ABNORMAL HIGH (ref 65–99)

## 2017-01-17 LAB — POTASSIUM: Potassium: 4.3 mmol/L (ref 3.5–5.1)

## 2017-01-17 NOTE — Progress Notes (Signed)
[facility]  ROUNDING NOTE   Subjective:   Daughter at bedside.  History taken with assistance of Spanish Interpreter   Objective:  Vital signs in last 24 hours:  Temp:  [97.9 F (36.6 C)-98.1 F (36.7 C)] 98 F (36.7 C) (02/09 0814) Pulse Rate:  [59-76] 62 (02/09 1124) Resp:  [18-20] 20 (02/09 0814) BP: (91-163)/(37-54) 96/41 (02/09 1124) SpO2:  [96 %-100 %] 100 % (02/09 1124) Weight:  [47.1 kg (103 lb 12.8 oz)] 47.1 kg (103 lb 12.8 oz) (02/09 0500)  Weight change: -0 kg (-0 oz) Filed Weights   01/16/17 0500 01/16/17 0920 01/17/17 0500  Weight: 49.6 kg (109 lb 4.8 oz) 49.6 kg (109 lb 4.8 oz) 47.1 kg (103 lb 12.8 oz)    Intake/Output: I/O last 3 completed shifts: In: 660 [P.O.:660] Out: 1500 [Other:1500]   Intake/Output this shift:  No intake/output data recorded.  Physical Exam: General: NAD, laying in bed  Head: Normocephalic, atraumatic. Moist oral mucosal membranes  Eyes: Anicteric, PERRL  Neck: Supple, trachea midline  Lungs:  Clear to auscultation  Heart: Regular rate and rhythm  Abdomen:  Soft, nontender,   Extremities: no peripheral edema.  Neurologic: Nonfocal, moving all four extremities  Skin: No lesions  Access: RIJ permcath, maturing Right AVF    Basic Metabolic Panel:  Recent Labs Lab 01/14/17 1444 01/14/17 1856 01/15/17 0416 01/15/17 1554 01/16/17 0502 01/17/17 0438  NA 133* 139 138 137 137  --   K >7.5* 4.2 5.3* 5.4* 5.5* 4.3  CL 92* 96* 96* 95* 97*  --   CO2 27 30 29 31 30   --   GLUCOSE 417* 52* 98 220* 123*  --   BUN 77* 19 26* 33* 43*  --   CREATININE 7.38* 3.06* 4.03* 5.08* 5.47*  --   CALCIUM 8.4* 9.1 7.9* 7.9* 7.8*  --   MG  --   --  1.9  --   --   --   PHOS  --   --   --   --  4.4  --     Liver Function Tests: No results for input(s): AST, ALT, ALKPHOS, BILITOT, PROT, ALBUMIN in the last 168 hours.  Recent Labs Lab 01/14/17 0749  LIPASE 29   No results for input(s): AMMONIA in the last 168  hours.  CBC:  Recent Labs Lab 01/14/17 0749 01/15/17 0416 01/16/17 0502  WBC 12.7* 11.1* 8.5  HGB 11.7* 11.3* 11.9*  HCT 36.6 32.9* 34.8*  MCV 93.0 88.1 90.0  PLT 127* 128* 133*    Cardiac Enzymes:  Recent Labs Lab 01/14/17 0749  TROPONINI <0.03    BNP: Invalid input(s): POCBNP  CBG:  Recent Labs Lab 01/16/17 1306 01/16/17 1635 01/16/17 2132 01/17/17 0732 01/17/17 1141  GLUCAP 120* 239* 225* 129* 195*    Microbiology: Results for orders placed or performed during the hospital encounter of 01/14/17  Blood culture (routine x 2)     Status: None (Preliminary result)   Collection Time: 01/14/17  8:54 AM  Result Value Ref Range Status   Specimen Description BLOOD L HAND  Final   Special Requests BOTTLES DRAWN AEROBIC AND ANAEROBIC BCAV  Final   Culture NO GROWTH 3 DAYS  Final   Report Status PENDING  Incomplete  MRSA PCR Screening     Status: None   Collection Time: 01/14/17 12:25 PM  Result Value Ref Range Status   MRSA by PCR NEGATIVE NEGATIVE Final    Comment:  The GeneXpert MRSA Assay (FDA approved for NASAL specimens only), is one component of a comprehensive MRSA colonization surveillance program. It is not intended to diagnose MRSA infection nor to guide or monitor treatment for MRSA infections.   Blood culture (routine x 2)     Status: None (Preliminary result)   Collection Time: 01/14/17 12:39 PM  Result Value Ref Range Status   Specimen Description BLOOD LEFT HAND  Final   Special Requests BOTTLES DRAWN AEROBIC AND ANAEROBIC BCAV  Final   Culture NO GROWTH 3 DAYS  Final   Report Status PENDING  Incomplete    Coagulation Studies: No results for input(s): LABPROT, INR in the last 72 hours.  Urinalysis: No results for input(s): COLORURINE, LABSPEC, PHURINE, GLUCOSEU, HGBUR, BILIRUBINUR, KETONESUR, PROTEINUR, UROBILINOGEN, NITRITE, LEUKOCYTESUR in the last 72 hours.  Invalid input(s): APPERANCEUR    Imaging: No results  found.   Medications:    . atorvastatin  40 mg Oral q1800  . calcium acetate  667 mg Oral TID WC  . heparin  5,000 Units Subcutaneous Q8H  . hydrALAZINE  25 mg Oral Q8H  . insulin aspart  0-5 Units Subcutaneous QHS  . insulin aspart  0-9 Units Subcutaneous TID WC     Assessment/ Plan:  [name] is a 61 y.o. 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   [facility]  1. ESRD on HD TTS: with hyperkalemia.  - emergent hemodialysis on admission.  - Resume TTS schedule. - discussed low potassium diet.   2. Anemia of chronic kidney disease: Hemoglobin  11.9 - hold epo  3. Secondary hyperparathyroidism. With hypocalcemia - calcium acetate with meals.   4. Hypertension: with bradycaria  Home regimen of losartan, amlodipine and hydralazine. - Discontinue metoprolol due to bradycardia   LOS: 3 Twanda Stakes 2/9/20181:01 PM

## 2017-01-17 NOTE — Discharge Summary (Signed)
[facility] [place] NAME: Anita Contreras    MR#:  [id number]  DATE OF BIRTH:  1956/04/26  DATE OF ADMISSION:  01/14/2017 ADMITTING PHYSICIAN: Laverle Hobby, MD  DATE OF DISCHARGE: 01/17/2017  3:03 PM  PRIMARY CARE PHYSICIAN: WHITE, Arlie Solomons, FNP     ADMISSION DIAGNOSIS:  End stage renal disease ([place]) [N18.6] Hyperkalemia [E87.5] Hyperglycemia [R73.9] Hypothermia, initial encounter [T68.XXXA]  DISCHARGE DIAGNOSIS:  Active Problems:   Hyperkalemia   Orthostatic hypotension   Bradycardia   Syncope   Nausea and vomiting   Essential hypertension, malignant   Lactic acidosis   Leukocytosis   SECONDARY DIAGNOSIS:   Past Medical History:  Diagnosis Date  . Acute cystitis with hematuria   . Anemia   . Cataracts, bilateral   . CKD (chronic kidney disease)   . Diabetic retinopathy, background ([place])   . Dialysis patient ([place])    Tues, Thurs, Sat  . Dizziness   . Hyperlipidemia   . Hypertension   . Migraine variant   . Neuropathy ([place])   . OP (osteoporosis)   . Personal history of noncompliance with medical treatment, presenting hazards to health   . Steal syndrome as complication of dialysis access ([place])   . Stroke ([place])   . Type II diabetes mellitus with ophthalmic manifestations, uncontrolled ([place])   . Vitamin D deficiency     .pro HOSPITAL COURSE:   The patient is a 61-year-old Spanish female with a history significant for history of end-stage renal disease, dialysis Tuesdays, Thursdays, Saturdays, who missed dialysis on Tuesday due to nausea, vomiting episode, questionable syncope. On arrival to emergency room, she was noted to be hyperkalemic and was admitted, she underwent urgent dialysis on the day of admission .  She was also noted to be bradycardic, metoprolol was discontinued.  Echocardiogram done during this admission revealed normal ejection fraction, no wall motion abnormalities, grade 1  diastolic dysfunction, moderate tricuspid regurgitation, pulmonary arterial pressures at the upper normal limits. I discussed patient's case with Dr. Nehemiah Massed, cardiologist, who recommended patient to be followed by him as outpatient for outpatient stress test. Patient was agreeable. The patient was felt to be stable to be discharged home today Discussion by problem: #1. Hyperkalemia due to end-stage renal disease, status post 2 consecutive hemodialysis sessions, potassium level has improved, patient was advised to follow up with [facility] nephrology for hemodialysis according to her schedule  #2. Bradycardia, now off metoprolol, TSH was found to be normal, suspected hyperkalemia related , it is recommended to restart metoprolol as outpatient if the patient remains stable and it is dictated by patient's blood pressure needs.  #3. Nausea, vomiting, resolved, supportive therapy #4. Diabetes mellitus, , resume 70/30 insulin as patient's oral intake improved 5. Malignant essential hypertension, continue hydralazine, Cozaar, Norvasc, now off metoprolol DISCHARGE CONDITIONS:   Stable  CONSULTS OBTAINED:  Treatment Team:  Corey Skains, MD  DRUG ALLERGIES:  No Known Allergies  DISCHARGE MEDICATIONS:   Discharge Medication List as of 01/17/2017  2:34 PM    CONTINUE these medications which have NOT CHANGED   Details  acetaminophen (TYLENOL) 500 MG tablet Take 1,000 mg by mouth every 6 (six) hours as needed for mild pain or headache., Historical Med    atorvastatin (LIPITOR) 40 MG tablet Take 40 mg by mouth daily at 6 PM. , Historical Med    hydrALAZINE (APRESOLINE) 25 MG tablet Take 1 tablet (25 mg total) by mouth 3 (three) times daily., Starting  Fri 04/19/2016, Normal    hydrOXYzine (ATARAX/VISTARIL) 25 MG tablet Take 25 mg by mouth 2 (two) times daily as needed for itching., Historical Med    insulin NPH-regular Human (NOVOLIN 70/30) (70-30) 100 UNIT/ML injection Inject 6 Units into the skin 2  (two) times daily with a meal. , Historical Med    losartan (COZAAR) 50 MG tablet Take 1 tablet (50 mg total) by mouth daily., Starting Tue 05/07/2016, Normal    amLODipine (NORVASC) 10 MG tablet Take 1 tablet (10 mg total) by mouth daily., Starting Fri 04/19/2016, Normal    aspirin EC 81 MG tablet Take 1 tablet (81 mg total) by mouth daily., Starting Fri 11/08/2016, Print    calcium acetate (PHOSLO) 667 MG capsule Take 1 capsule (667 mg total) by mouth 3 (three) times daily with meals., Starting Fri 04/19/2016, Normal    Nutritional Supplements (FEEDING SUPPLEMENT, NEPRO CARB STEADY,) LIQD Take 237 mLs by mouth 2 (two) times daily between meals., Starting Tue 05/07/2016, Normal    oxyCODONE-acetaminophen (PERCOCET) 7.5-325 MG tablet One to Two Tabs Every Four to Six Hours As Needed For Pain, Print    traMADol (ULTRAM) 50 MG tablet Take 1 tablet (50 mg total) by mouth every 6 (six) hours as needed., Starting Wed 05/22/2016, Print      STOP taking these medications     metoprolol tartrate (LOPRESSOR) 25 MG tablet          DISCHARGE INSTRUCTIONS:    The patient is to follow-up with primary care physician, [facility] nephrology for hemodialysis as scheduled  If you experience worsening of your admission symptoms, develop shortness of breath, life threatening emergency, suicidal or homicidal thoughts you must seek medical attention immediately by calling 911 or calling your MD immediately  if symptoms less severe.  You Must read complete instructions/literature along with all the possible adverse reactions/side effects for all the Medicines you take and that have been prescribed to you. Take any new Medicines after you have completely understood and accept all the possible adverse reactions/side effects.   Please note  You were cared for by a hospitalist during your hospital stay. If you have any questions about your discharge medications or the care you received while you were in the hospital  after you are discharged, you can call the unit and asked to speak with the hospitalist on call if the hospitalist that took care of you is not available. Once you are discharged, your primary care physician will handle any further medical issues. Please note that NO REFILLS for any discharge medications will be authorized once you are discharged, as it is imperative that you return to your primary care physician (or establish a relationship with a primary care physician if you do not have one) for your aftercare needs so that they can reassess your need for medications and monitor your lab values.    Today   CHIEF COMPLAINT:   Chief Complaint  Patient presents with  . Hyperglycemia    HISTORY OF PRESENT ILLNESS:  Anita Contreras  is a 61 y.o. female with a known history of end-stage renal disease, dialysis Tuesdays, Thursdays, Saturdays, who missed dialysis on Tuesday due to nausea, vomiting episode, questionable syncope. On arrival to emergency room, she was noted to be hyperkalemic and was admitted, she underwent urgent dialysis on the day of admission .  She was also noted to be bradycardic, metoprolol was discontinued.  Echocardiogram done during this admission revealed normal ejection fraction, no wall motion abnormalities, grade  1 diastolic dysfunction, moderate tricuspid regurgitation, pulmonary arterial pressures at the upper normal limits. I discussed patient's case with Dr. Nehemiah Massed, cardiologist, who recommended patient to be followed by him as outpatient for outpatient stress test. Patient was agreeable. The patient was felt to be stable to be discharged home today Discussion by problem: #1. Hyperkalemia due to end-stage renal disease, status post 2 consecutive hemodialysis sessions, potassium level has improved, patient was advised to follow up with [facility] nephrology for hemodialysis according to her schedule  #2. Bradycardia, now off metoprolol, TSH was found to be normal,  suspected hyperkalemia related , it is recommended to restart metoprolol as outpatient if the patient remains stable and it is dictated by patient's blood pressure needs.  #3. Nausea, vomiting, resolved, supportive therapy #4. Diabetes mellitus, , resume 70/30 insulin as patient's oral intake improved 5. Malignant essential hypertension, continue hydralazine, Cozaar, Norvasc, now off metoprolol    VITAL SIGNS:  Blood pressure (!) 118/46, pulse 63, temperature 98 F (36.7 C), temperature source Oral, resp. rate 16, height 4\' 5"  (1.346 m), weight 47.1 kg (103 lb 12.8 oz), last menstrual period 09/19/2001, SpO2 100 %.  I/O:   Intake/Output Summary (Last 24 hours) at 01/17/17 1635 Last data filed at 01/17/17 0429  Gross per 24 hour  Intake              360 ml  Output                0 ml  Net              360 ml    PHYSICAL EXAMINATION:  GENERAL:  61 y.o.-year-old patient lying in the bed with no acute distress.  EYES: Pupils equal, round, reactive to light and accommodation. No scleral icterus. Extraocular muscles intact.  HEENT: Head atraumatic, normocephalic. Oropharynx and nasopharynx clear.  NECK:  Supple, no jugular venous distention. No thyroid enlargement, no tenderness.  LUNGS: Normal breath sounds bilaterally, no wheezing, rales,rhonchi or crepitation. No use of accessory muscles of respiration.  CARDIOVASCULAR: S1, S2 normal. No murmurs, rubs, or gallops.  ABDOMEN: Soft, non-tender, non-distended. Bowel sounds present. No organomegaly or mass.  EXTREMITIES: No pedal edema, cyanosis, or clubbing.  NEUROLOGIC: Cranial nerves II through XII are intact. Muscle strength 5/5 in all extremities. Sensation intact. Gait not checked.  PSYCHIATRIC: The patient is alert and oriented x 3.  SKIN: No obvious rash, lesion, or ulcer.   DATA REVIEW:   CBC  Recent Labs Lab 01/16/17 0502  WBC 8.5  HGB 11.9*  HCT 34.8*  PLT 133*    Chemistries   Recent Labs Lab 01/15/17 0416   01/16/17 0502 01/17/17 0438  NA 138  < > 137  --   K 5.3*  < > 5.5* 4.3  CL 96*  < > 97*  --   CO2 29  < > 30  --   GLUCOSE 98  < > 123*  --   BUN 26*  < > 43*  --   CREATININE 4.03*  < > 5.47*  --   CALCIUM 7.9*  < > 7.8*  --   MG 1.9  --   --   --   < > = values in this interval not displayed.  Cardiac Enzymes  Recent Labs Lab 01/14/17 0749  TROPONINI <0.03    Microbiology Results  Results for orders placed or performed during the hospital encounter of 01/14/17  Blood culture (routine x 2)     Status: None (  Preliminary result)   Collection Time: 01/14/17  8:54 AM  Result Value Ref Range Status   Specimen Description BLOOD L HAND  Final   Special Requests BOTTLES DRAWN AEROBIC AND ANAEROBIC BCAV  Final   Culture NO GROWTH 3 DAYS  Final   Report Status PENDING  Incomplete  MRSA PCR Screening     Status: None   Collection Time: 01/14/17 12:25 PM  Result Value Ref Range Status   MRSA by PCR NEGATIVE NEGATIVE Final    Comment:        The GeneXpert MRSA Assay (FDA approved for NASAL specimens only), is one component of a comprehensive MRSA colonization surveillance program. It is not intended to diagnose MRSA infection nor to guide or monitor treatment for MRSA infections.   Blood culture (routine x 2)     Status: None (Preliminary result)   Collection Time: 01/14/17 12:39 PM  Result Value Ref Range Status   Specimen Description BLOOD LEFT HAND  Final   Special Requests BOTTLES DRAWN AEROBIC AND ANAEROBIC BCAV  Final   Culture NO GROWTH 3 DAYS  Final   Report Status PENDING  Incomplete    RADIOLOGY:  No results found.  EKG:   Orders placed or performed during the hospital encounter of 01/14/17  . ED EKG  . ED EKG      Management plans discussed with the patient, family and they are in agreement.  CODE STATUS:     Code Status Orders        Start     Ordered   01/14/17 0922  Full code  Continuous     01/14/17 0925    Code Status History     Date Active Date Inactive Code Status Order ID Comments User Context   11/08/2016  6:08 PM 11/10/2016  5:54 PM Full Code [id number]  Algernon Huxley, MD Inpatient   10/17/2016  1:19 PM 10/17/2016  7:24 PM Full Code [id number]  Algernon Huxley, MD Inpatient   04/23/2016  8:29 PM 05/07/2016  8:01 PM Full Code [id number]  Vaughan Basta, MD Inpatient   04/15/2016  7:18 PM 04/19/2016  4:08 PM Full Code [id number]  Gladstone Lighter, MD Inpatient      TOTAL TIME TAKING CARE OF THIS PATIENT: 40 minutes.    Theodoro Grist M.D on 01/17/2017 at 4:35 PM  Between 7am to 6pm - Pager - [phone]  After 6pm go to [email or web address] - password [email or web address] [facility]  Office  [phone]  CC: Primary care physician; WHITE, Arlie Solomons, FNP

## 2017-01-17 NOTE — Progress Notes (Signed)
Inpatient Diabetes Program Recommendations  AACE/ADA: New Consensus Statement on Inpatient Glycemic Control (2015)  Target Ranges:  Prepandial:   less than 140 mg/dL      Peak postprandial:   less than 180 mg/dL (1-2 hours)      Critically ill patients:  140 - 180 mg/dL   Lab Results  Component Value Date   GLUCAP 195 (H) 01/17/2017   HGBA1C 7.2 (H) 04/15/2016    Review of Glycemic Control Results for [name], Anita Contreras (MRN [id number]) as of 01/17/2017 12:28  Ref. Range 01/16/2017 13:06 01/16/2017 16:35 01/16/2017 21:32 01/17/2017 07:32 01/17/2017 11:41  Glucose-Capillary Latest Ref Range: 65 - 99 mg/dL 120 (H) 239 (H) 225 (H) 129 (H) 195 (H)     Diabetes history: Type 2 Outpatient Diabetes medications: NPH 70/30 6 units bid Current orders for Inpatient glycemic control: Novolog 0-9 units tid, Novolog 0-5 units qhs  Inpatient Diabetes Program Recommendations: Agree with current medications for blood sugar management.    Per ADA recommendations "consider performing an A1C on all patients with diabetes or hyperglycemia admitted to the hospital if not performed in the prior 3 months".  Gentry Fitz, RN, BA, MHA, CDE Diabetes Coordinator Inpatient Diabetes Program  [phone] (Team Pager) [phone] ([place]) 01/17/2017 12:29 PM

## 2017-01-17 NOTE — Progress Notes (Signed)
*  PRELIMINARY RESULTS* Echocardiogram 2D Echocardiogram has been performed.  Sherrie Sport 01/17/2017, 12:09 PM

## 2017-01-17 NOTE — Care Management (Signed)
Patient discharging today.  No new prescriptions for discharge. Updated clinical faxed to [facility] liaison and notified of discharge.  RNCM signing off

## 2017-01-18 LAB — URINE CULTURE

## 2017-01-19 LAB — CULTURE, BLOOD (ROUTINE X 2)
CULTURE: NO GROWTH
CULTURE: NO GROWTH

## 2017-01-24 ENCOUNTER — Encounter: Payer: Self-pay | Admitting: Gynecology

## 2017-01-24 ENCOUNTER — Ambulatory Visit
Admission: EM | Admit: 2017-01-24 | Discharge: 2017-01-24 | Disposition: A | Discharge status: Home / Self Care | Payer: Self-pay | Attending: Family Medicine | Admitting: Family Medicine

## 2017-01-24 DIAGNOSIS — R5383 Other fatigue: Secondary | ICD-10-CM

## 2017-01-24 DIAGNOSIS — R531 Weakness: Secondary | ICD-10-CM

## 2017-01-24 LAB — BASIC METABOLIC PANEL
Anion gap: 12 (ref 5–15)
BUN: 36 mg/dL — AB (ref 6–20)
CHLORIDE: 96 mmol/L — AB (ref 101–111)
CO2: 25 mmol/L (ref 22–32)
Calcium: 8.2 mg/dL — ABNORMAL LOW (ref 8.9–10.3)
Creatinine, Ser: 4.48 mg/dL — ABNORMAL HIGH (ref 0.44–1.00)
GFR calc Af Amer: 11 mL/min — ABNORMAL LOW (ref >60)
GFR calc non Af Amer: 10 mL/min — ABNORMAL LOW (ref >60)
Glucose, Bld: 300 mg/dL — ABNORMAL HIGH (ref 65–99)
POTASSIUM: 5 mmol/L (ref 3.5–5.1)
SODIUM: 133 mmol/L — AB (ref 135–145)

## 2017-01-24 LAB — CBC WITH DIFFERENTIAL/PLATELET
Basophils Absolute: 0.1 10*3/uL (ref 0–0.1)
Basophils Relative: 1 %
EOS ABS: 0.2 10*3/uL (ref 0–0.7)
Eosinophils Relative: 3 %
HEMATOCRIT: 34.3 % — AB (ref 35.0–47.0)
HEMOGLOBIN: 11.6 g/dL — AB (ref 12.0–16.0)
LYMPHS ABS: 2 10*3/uL (ref 1.0–3.6)
LYMPHS PCT: 27 %
MCH: 30.4 pg (ref 26.0–34.0)
MCHC: 33.8 g/dL (ref 32.0–36.0)
MCV: 90 fL (ref 80.0–100.0)
Monocytes Absolute: 0.8 10*3/uL (ref 0.2–0.9)
Monocytes Relative: 12 %
NEUTROS ABS: 4.2 10*3/uL (ref 1.4–6.5)
NEUTROS PCT: 57 %
Platelets: 175 10*3/uL (ref 150–440)
RBC: 3.81 MIL/uL (ref 3.80–5.20)
RDW: 17.2 % — ABNORMAL HIGH (ref 11.5–14.5)
WBC: 7.3 10*3/uL (ref 3.6–11.0)

## 2017-01-24 NOTE — Discharge Instructions (Signed)
Hold blood pressure medication tonight Follow up as scheduled for dialysis tomorrow

## 2017-01-24 NOTE — ED Triage Notes (Signed)
Per patient daughters their mom complain of weakness/dizziness today. Daughter stated mom was seen in the ER on 01/14/2017 for same symptom and was discharge with instruction to see cardiologist. Per daughters miss the appointment and never take their mom to see the cardiologist.

## 2017-01-25 NOTE — ED Provider Notes (Signed)
[facility]    CSN: [id number] Arrival date & time: 01/24/17  1947     History   Chief Complaint Chief Complaint  Patient presents with  . Fatigue    HPI Anita Contreras is a 61 y.o. female.   61 yo female with a h/o diabetes, ESRD on dialysis, and multiple chronic medical problems presents with a c/o generalized weakness since last night. Denies any fevers, chills, chest pains, shortness of breath, vomiting, diarrhea. Patient was recently hospitalized with the same and found to be severely hyperkalemic.    The history is provided by the patient. No language interpreter was used.  Weakness  Primary symptoms include dizziness.  Primary symptoms include no focal weakness, no loss of sensation, no speech change, no memory loss, no visual change, no auditory change. Primary symptoms comment: generalized weakness. This is a recurrent problem. The current episode started 12 to 24 hours ago. The problem has not changed since onset.There was no focality noted. There has been no fever. Pertinent negatives include no shortness of breath, no chest pain, no vomiting, no altered mental status, no confusion and no headaches.    Past Medical History:  Diagnosis Date  . Acute cystitis with hematuria   . Anemia   . Cataracts, bilateral   . CKD (chronic kidney disease)   . Diabetic retinopathy, background ([place])   . Dialysis patient ([place])    Tues, Thurs, Sat  . Dizziness   . Hyperlipidemia   . Hypertension   . Migraine variant   . Neuropathy ([place])   . OP (osteoporosis)   . Personal history of noncompliance with medical treatment, presenting hazards to health   . Steal syndrome as complication of dialysis access ([place])   . Stroke ([place])   . Type II diabetes mellitus with ophthalmic manifestations, uncontrolled ([place])   . Vitamin D deficiency     Patient Active Problem List   Diagnosis Date Noted  . Bradycardia 01/17/2017  . Syncope 01/17/2017  . Nausea and vomiting  01/17/2017  . Essential hypertension, malignant 01/17/2017  . Lactic acidosis 01/17/2017  . Leukocytosis 01/17/2017  . Orthostatic hypotension 01/17/2017  . Hyperkalemia 01/14/2017  . Steal syndrome as complication of dialysis access ([place]) 11/08/2016  . ESRD on dialysis ([place]) 10/15/2016  . Renal dialysis device, implant, or graft complication 71/05/2693  . Hyperlipidemia 10/15/2016  . Essential hypertension 10/15/2016  . Controlled diabetes mellitus type 2 with complications ([place]) 85/46/2703  . Acute on chronic renal failure ([place]) 04/23/2016  . 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 Acute right flank pain   . ARF (acute renal failure) ([place]) 04/15/2016  . Acute cystitis 05/26/2015  . Neuropathy ([place]) 05/26/2015    Past Surgical History:  Procedure Laterality Date  . AV FISTULA PLACEMENT Right 10/10/2016   Procedure: ARTERIOVENOUS (AV) FISTULA CREATION ( BRACHIOCEPHALIC );  Surgeon: Algernon Huxley, MD;  Location: [facility] ORS;  Service: Vascular;  Laterality: Right;  . DILATION AND CURETTAGE OF UTERUS    . DISTAL REVASCULARIZATION AND INTERVAL LIGATION (DRIL) Right 11/08/2016   Procedure: DISTAL REVASCULARIZATION AND INTERVAL LIGATION (DRIL);  Surgeon: Algernon Huxley, MD;  Location: [facility] OR[facility];  Service: Vascular;  Laterality: Right;  . PERIPHERAL VASCULAR CATHETERIZATION N/A 04/25/2016   Procedure: Dialysis/Perma Catheter Insertion;  Surgeon: Algernon Huxley, MD;  Location: [facility];  Service: Cardiovascular;  Laterality: N/A;  . PERIPHERAL VASCULAR CATHETERIZATION Right 10/17/2016   Procedure: Upper Extremity Angiography;  Surgeon: Algernon Huxley, MD;  Location: [facility];  Service: Cardiovascular;  Laterality: Right;    OB History    No data available       Home Medications    Prior to Admission medications   Medication Sig Start Date End Date Taking? Authorizing Provider  acetaminophen (TYLENOL) 500 MG tablet Take 1,000 mg by mouth every  6 (six) hours as needed for mild pain or headache.   Yes Historical Provider, MD  amLODipine (NORVASC) 10 MG tablet Take 1 tablet (10 mg total) by mouth daily. 04/19/16  Yes Fritzi Mandes, MD  aspirin EC 81 MG tablet Take 1 tablet (81 mg total) by mouth daily. 11/08/16  Yes Algernon Huxley, MD  atorvastatin (LIPITOR) 40 MG tablet Take 40 mg by mouth daily at 6 PM.    Yes Historical Provider, MD  calcium acetate (PHOSLO) 667 MG capsule Take 1 capsule (667 mg total) by mouth 3 (three) times daily with meals. 04/19/16  Yes Fritzi Mandes, MD  hydrALAZINE (APRESOLINE) 25 MG tablet Take 1 tablet (25 mg total) by mouth 3 (three) times daily. 04/19/16  Yes Fritzi Mandes, MD  hydrOXYzine (ATARAX/VISTARIL) 25 MG tablet Take 25 mg by mouth 2 (two) times daily as needed for itching.   Yes Historical Provider, MD  insulin NPH-regular Human (NOVOLIN 70/30) (70-30) 100 UNIT/ML injection Inject 6 Units into the skin 2 (two) times daily with a meal.    Yes Historical Provider, MD  losartan (COZAAR) 50 MG tablet Take 1 tablet (50 mg total) by mouth daily. 05/07/16  Yes Epifanio Lesches, MD  Nutritional Supplements (FEEDING SUPPLEMENT, NEPRO CARB STEADY,) LIQD Take 237 mLs by mouth 2 (two) times daily between meals. 05/07/16  Yes Epifanio Lesches, MD  oxyCODONE-acetaminophen (PERCOCET) 7.5-325 MG tablet One to Two Tabs Every Four to Six Hours As Needed For Pain 11/10/16  Yes Kimberly A Stegmayer, PA-C  traMADol (ULTRAM) 50 MG tablet Take 1 tablet (50 mg total) by mouth every 6 (six) hours as needed. 05/22/16  Yes Daymon Larsen, MD    Family History Family History  Problem Relation Age of Onset  . Hypertension Mother   . Hyperlipidemia Mother     Social History Social History  Substance Use Topics  . Smoking status: Never Smoker  . Smokeless tobacco: Never Used  . Alcohol use No     Allergies   Patient has no known allergies.   Review of Systems Review of Systems  Respiratory: Negative for shortness of breath.     Cardiovascular: Negative for chest pain.  Gastrointestinal: Negative for vomiting.  Neurological: Positive for dizziness and weakness. Negative for speech change, focal weakness and headaches.  Psychiatric/Behavioral: Negative for confusion and memory loss.     Physical Exam Triage Vital Signs ED Triage Vitals  Enc Vitals Group     BP 01/24/17 2001 (!) 118/42     Pulse Rate 01/24/17 2001 70     Resp 01/24/17 2001 16     Temp 01/24/17 2001 97.9 F (36.6 C)     Temp Source 01/24/17 2001 Oral     SpO2 01/24/17 2001 100 %     Weight 01/24/17 2006 108 lb (49 kg)     Height 01/24/17 2006 4\' 5"  (1.346 m)     Head Circumference --      Peak Flow --      Pain Score 01/24/17 2008 0     Pain Loc --      Pain Edu? --  Excl. in GC? --    No data found.   Updated Vital Signs BP (!) 118/42 (BP Location: Left Arm)   Pulse 70   Temp 97.9 F (36.6 C) (Oral)   Resp 16   Ht 4\' 5"  (1.346 m)   Wt 108 lb (49 kg)   LMP 09/19/2001 (Approximate)   SpO2 100%   BMI 27.03 kg/m   Visual Acuity Right Eye Distance:   Left Eye Distance:   Bilateral Distance:    Right Eye Near:   Left Eye Near:    Bilateral Near:     Physical Exam  Constitutional: She appears well-developed and well-nourished. She appears cachectic.  Non-toxic appearance. She has a sickly appearance. She does not appear ill. No distress.  HENT:  Head: Normocephalic and atraumatic.  Right Ear: Tympanic membrane, external ear and ear canal normal.  Left Ear: Tympanic membrane, external ear and ear canal normal.  Nose: No mucosal edema, rhinorrhea, nose lacerations, sinus tenderness, nasal deformity, septal deviation or nasal septal hematoma. No epistaxis.  No foreign bodies. Right sinus exhibits no maxillary sinus tenderness and no frontal sinus tenderness. Left sinus exhibits no maxillary sinus tenderness and no frontal sinus tenderness.  Mouth/Throat: Uvula is midline, oropharynx is clear and moist and mucous membranes  are normal. No oropharyngeal exudate.  Eyes: Conjunctivae and EOM are normal. Pupils are equal, round, and reactive to light. Right eye exhibits no discharge. Left eye exhibits no discharge. No scleral icterus.  Neck: Normal range of motion. Neck supple. No thyromegaly present.  Cardiovascular: Normal rate, regular rhythm and normal heart sounds.   Pulmonary/Chest: Effort normal and breath sounds normal. No respiratory distress. She has no wheezes. She has no rales.  Abdominal: Soft. Bowel sounds are normal.  Musculoskeletal: She exhibits no edema.  Lymphadenopathy:    She has no cervical adenopathy.  Skin: No rash noted. She is not diaphoretic.  Nursing note and vitals reviewed.    UC Treatments / Results  Labs (all labs ordered are listed, but only abnormal results are displayed) Labs Reviewed  CBC WITH DIFFERENTIAL/PLATELET - Abnormal; Notable for the following:       Result Value   Hemoglobin 11.6 (*)    HCT 34.3 (*)    RDW 17.2 (*)    All other components within normal limits  BASIC METABOLIC PANEL - Abnormal; Notable for the following:    Sodium 133 (*)    Chloride 96 (*)    Glucose, Bld 300 (*)    BUN 36 (*)    Creatinine, Ser 4.48 (*)    Calcium 8.2 (*)    GFR calc non Af Amer 10 (*)    GFR calc Af Amer 11 (*)    All other components within normal limits    EKG  EKG Interpretation None       Radiology No results found.  Procedures Procedures (including critical care time)  Medications Ordered in UC Medications - No data to display   Initial Impression / Assessment and Plan / UC Course  I have reviewed the triage vital signs and the nursing notes.  Pertinent labs & imaging results that were available during my care of the patient were reviewed by me and considered in my medical decision making (see chart for details).       Final Clinical Impressions(s) / UC Diagnoses   Final diagnoses:  Fatigue, unspecified type  Weakness   (likely  multifactorial)  New Prescriptions Discharge Medication List as of 01/24/2017  9:13 PM     1. Lab results and diagnosis reviewed with patient and family 2. Recommend supportive treatment with increased fluids; hold hydralazine dose tonight; monitor blood pressure 4. Follow-up prn if symptoms worsen or don't improve   Norval Gable, MD 01/25/17 1826

## 2017-03-28 ENCOUNTER — Encounter (INDEPENDENT_AMBULATORY_CARE_PROVIDER_SITE_OTHER): Payer: Self-pay

## 2017-03-28 ENCOUNTER — Ambulatory Visit (INDEPENDENT_AMBULATORY_CARE_PROVIDER_SITE_OTHER): Payer: Self-pay | Admitting: Vascular Surgery

## 2017-04-14 ENCOUNTER — Other Ambulatory Visit (INDEPENDENT_AMBULATORY_CARE_PROVIDER_SITE_OTHER): Payer: Self-pay | Admitting: Vascular Surgery

## 2017-06-26 ENCOUNTER — Other Ambulatory Visit (INDEPENDENT_AMBULATORY_CARE_PROVIDER_SITE_OTHER): Payer: Self-pay | Admitting: Vascular Surgery

## 2017-06-26 DIAGNOSIS — N185 Chronic kidney disease, stage 5: Secondary | ICD-10-CM

## 2017-06-27 ENCOUNTER — Ambulatory Visit (INDEPENDENT_AMBULATORY_CARE_PROVIDER_SITE_OTHER): Payer: Self-pay | Admitting: Vascular Surgery

## 2017-06-27 ENCOUNTER — Ambulatory Visit (INDEPENDENT_AMBULATORY_CARE_PROVIDER_SITE_OTHER): Payer: Self-pay

## 2017-06-27 ENCOUNTER — Encounter (INDEPENDENT_AMBULATORY_CARE_PROVIDER_SITE_OTHER): Payer: Self-pay

## 2017-06-27 DIAGNOSIS — N185 Chronic kidney disease, stage 5: Secondary | ICD-10-CM

## 2017-06-27 DIAGNOSIS — N186 End stage renal disease: Secondary | ICD-10-CM

## 2017-06-27 DIAGNOSIS — T82898D Other specified complication of vascular prosthetic devices, implants and grafts, subsequent encounter: Secondary | ICD-10-CM

## 2017-08-23 IMAGING — US US AORTA
1 series · 13 of 25 positions shown · non-contrast
Comparison: Noncontrast abdominal CT 04/15/2016

CLINICAL DATA: Abdominal pain.  Evaluate for aortic dissection.

EXAM:
ULTRASOUND OF ABDOMINAL AORTA
TECHNIQUE: Ultrasound examination of the abdominal aorta was performed to
evaluate for abdominal aortic aneurysm.

[Series 1: us aorta · 0.17mm/px · 13 of 30 slices shown]
[im 1/30]
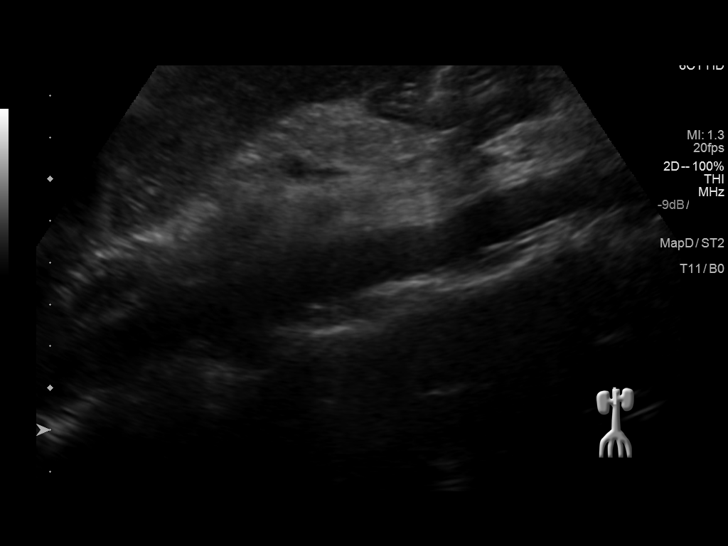
[im 3/30]
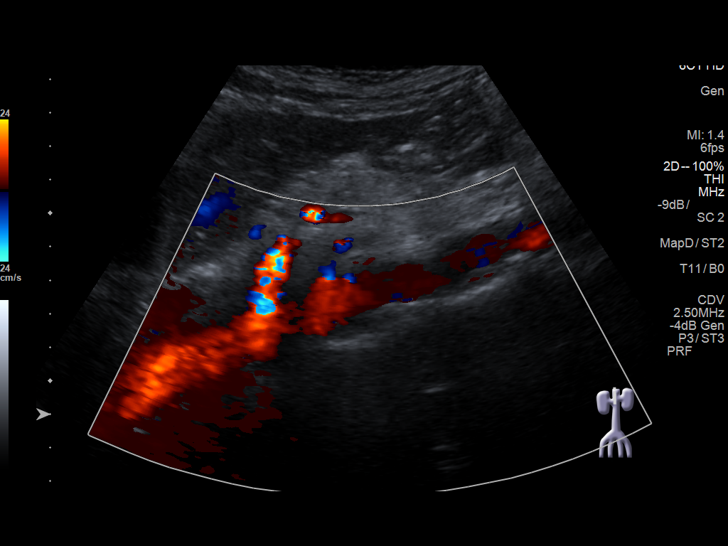
[im 5/30]
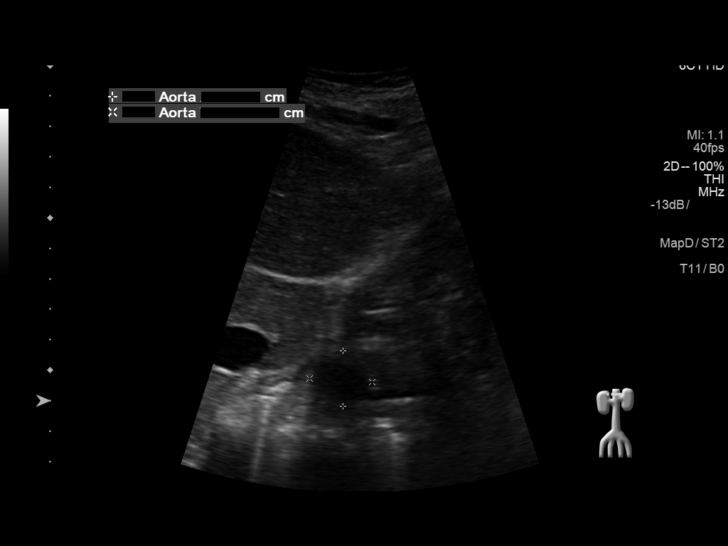
[im 8/30]
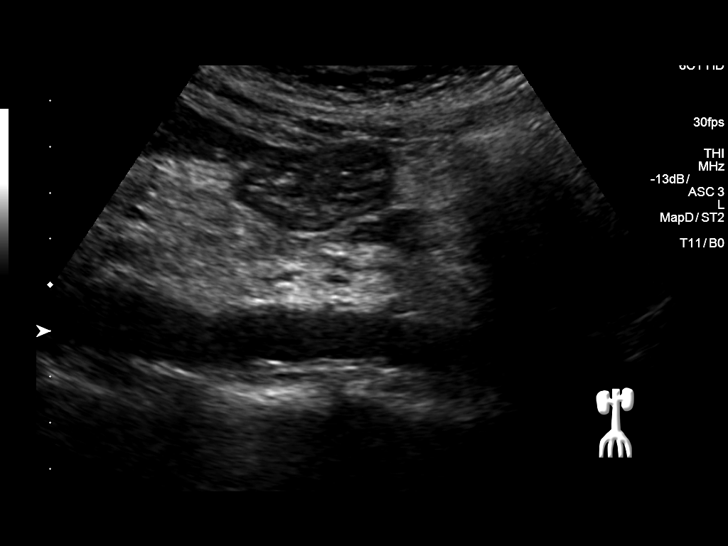
[im 10/30]
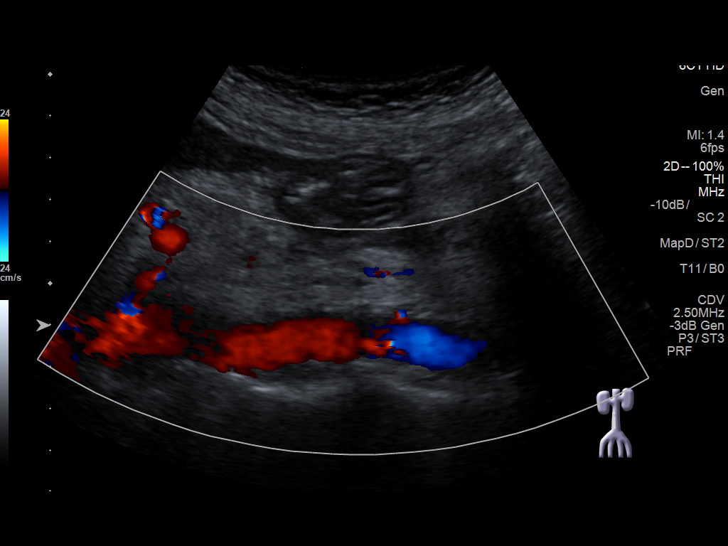
[im 13/30]
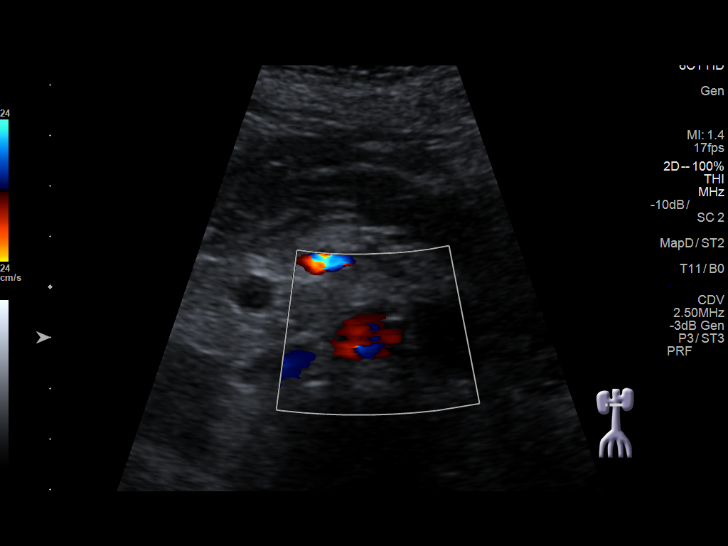
[im 15/30]
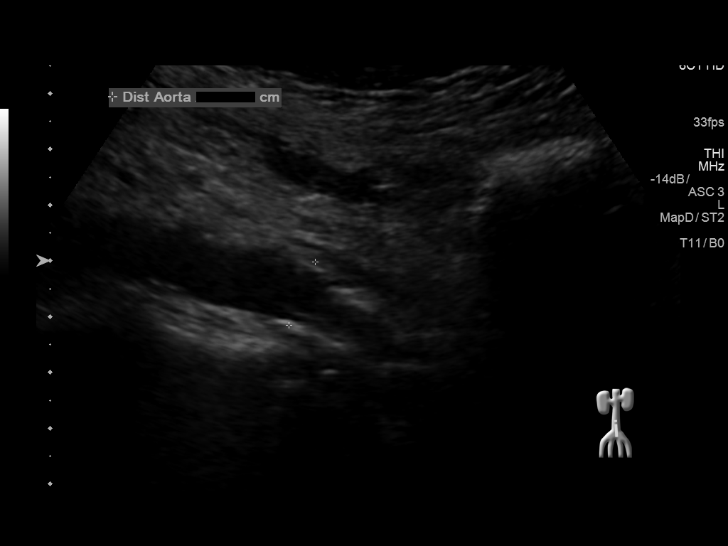
[im 17/30]
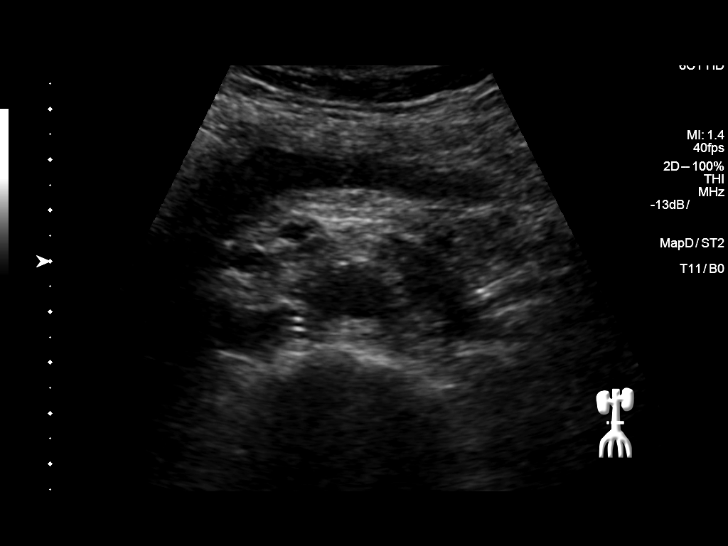
[im 20/30]
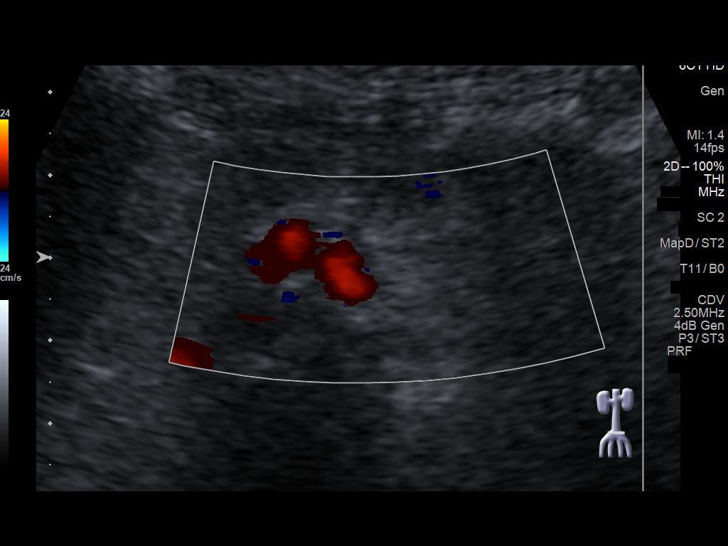
[im 22/30]
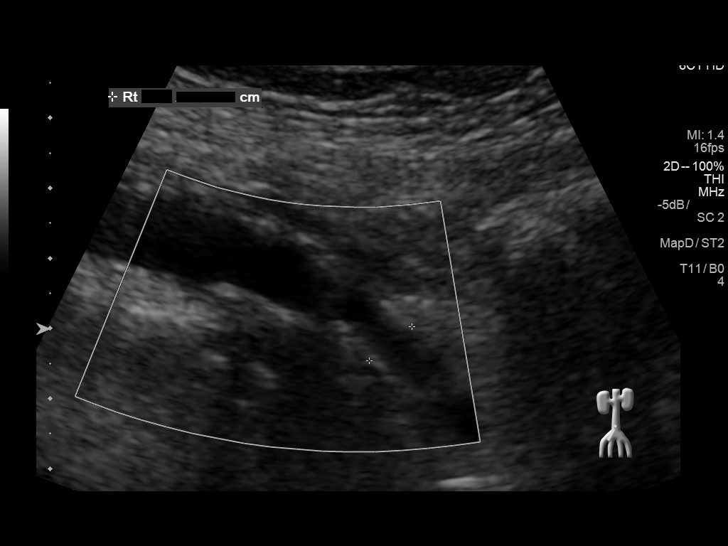
[im 25/30]
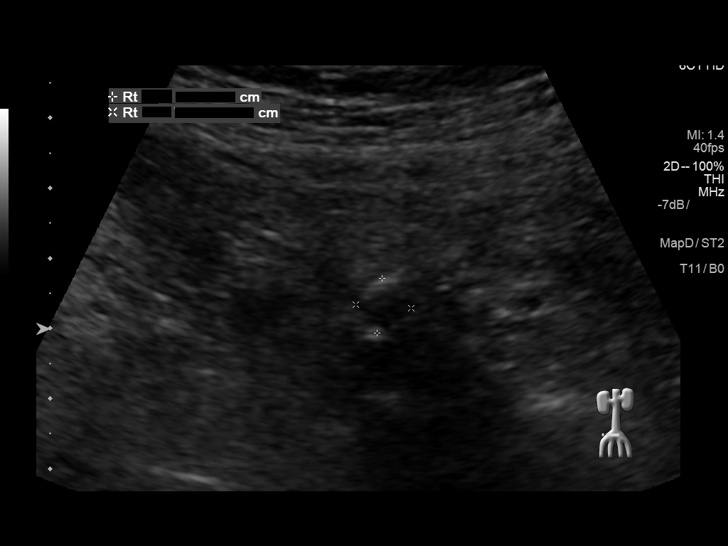
[im 27/30]
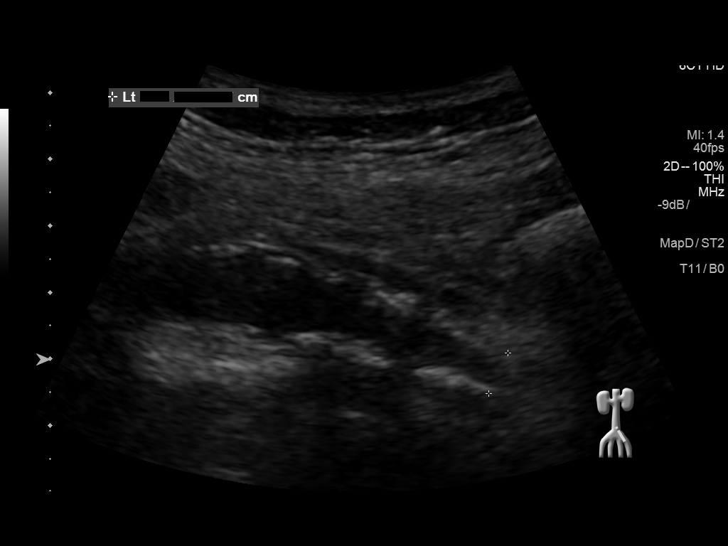
[im 30/30]
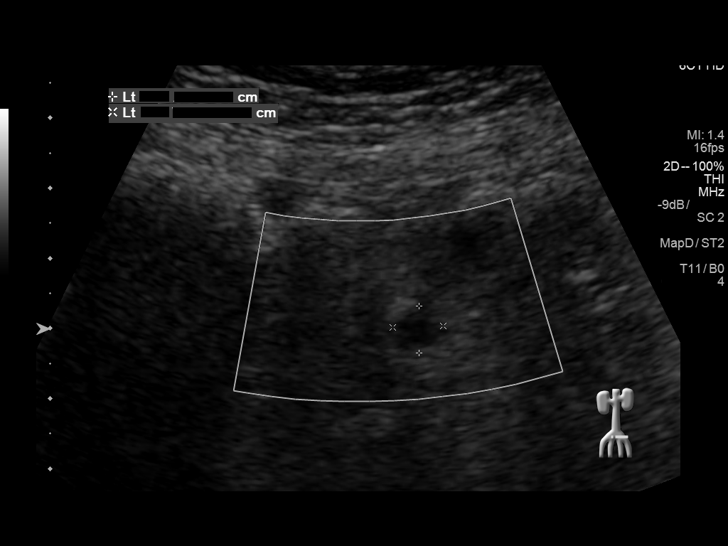

[13 of 25 positions shown; findings below may reference images not displayed]

FINDINGS: Abdominal Aorta

No abdominal aortic aneurysm. The distal abdominal aorta measures up
to 1.3 cm with atherosclerotic disease. Mid abdominal aorta measures
up to 1.4 cm. Maximum size of the proximal abdominal aorta is
cm. This modality is not optimal for evaluation for an aortic
dissection. However, a dissection is thought to be unlikely based on
the size of this vessel and there is no clear evidence for a false
channel.

Iliac arteries: Right common iliac artery measures up to 0.8 cm with
atherosclerotic calcifications. The right common iliac artery is
patent. Left common iliac artery measures up to the 0.7 cm and
patent.
IMPRESSION: Negative for an abdominal aortic aneurysm.

The abdominal aorta is small with atherosclerotic disease. Limited
evaluation for an aortic dissection based on the modality and size
of the aorta. An aortic dissection is thought to be unlikely based
on the color Doppler images and size of the aorta. However,
noncontrast MR would be more definitive evaluation for an aortic
dissection if the patient cannot receive intravenous contrast.

## 2017-08-24 IMAGING — US US PELVIS COMPLETE
1 series · 14 of 25 positions shown · non-contrast
Comparison: Abdominal and pelvic CT scan April 15, 2016.

CLINICAL DATA: Two days of vaginal bleeding ; the patient was
unable to void for the endovaginal portion of the study.

EXAM:
TRANSABDOMINAL ULTRASOUND OF PELVIS
TECHNIQUE: Transabdominal ultrasound examination of the pelvis was performed
including evaluation of the uterus, ovaries, adnexal regions, and
pelvic cul-de-sac.

[Series 1: us pelvis complete · 0.24mm/px · 14 of 50 slices shown]
[im 1/50]
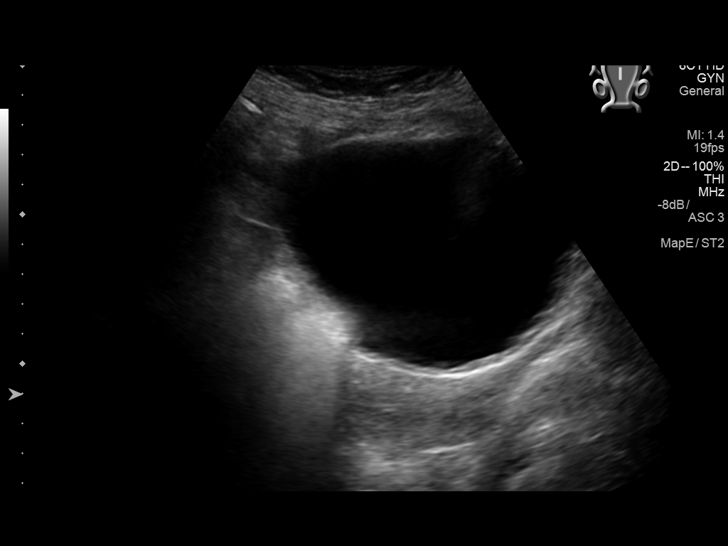
[im 5/50]
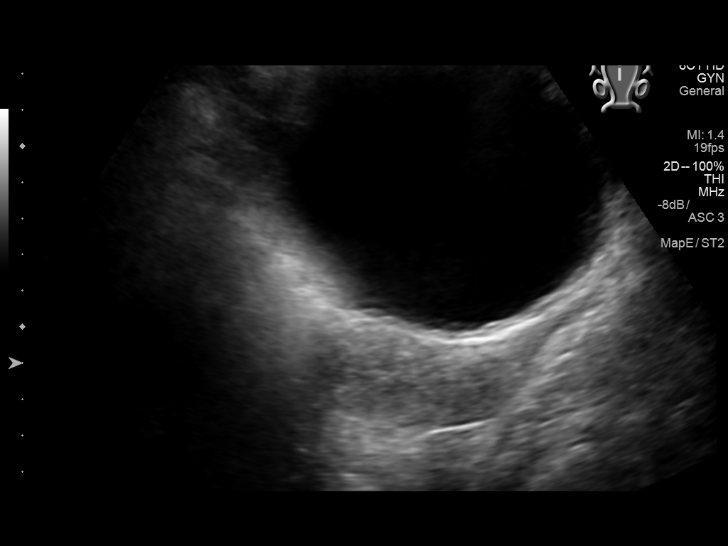
[im 9/50]
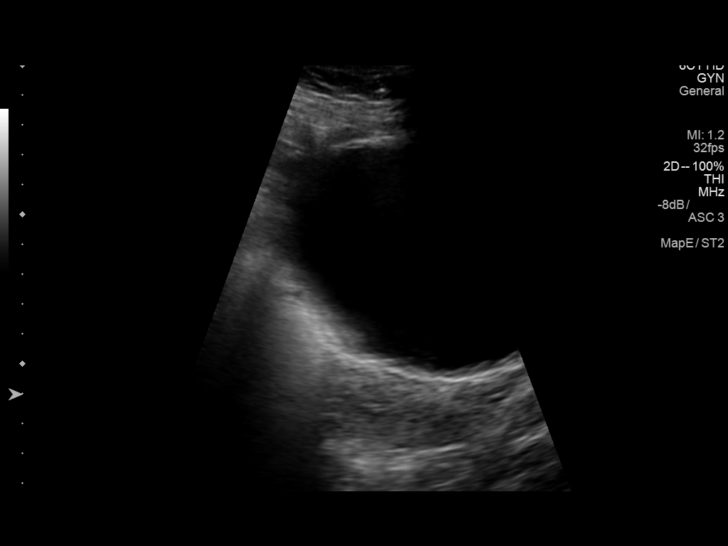
[im 13/50]
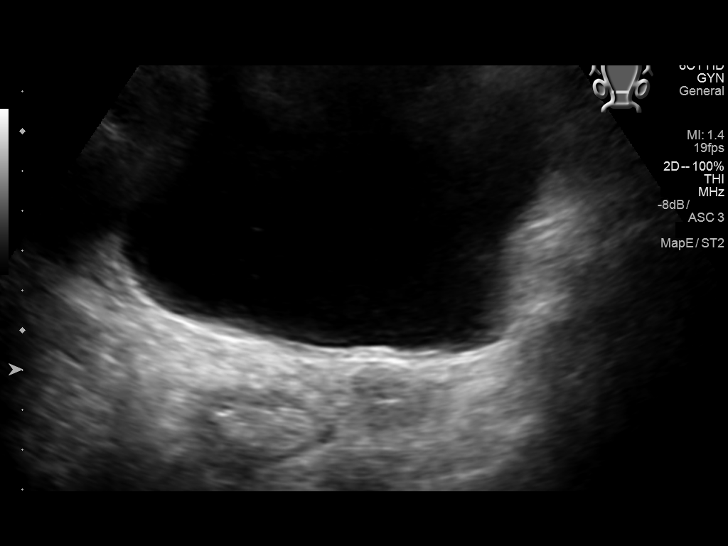
[im 17/50]
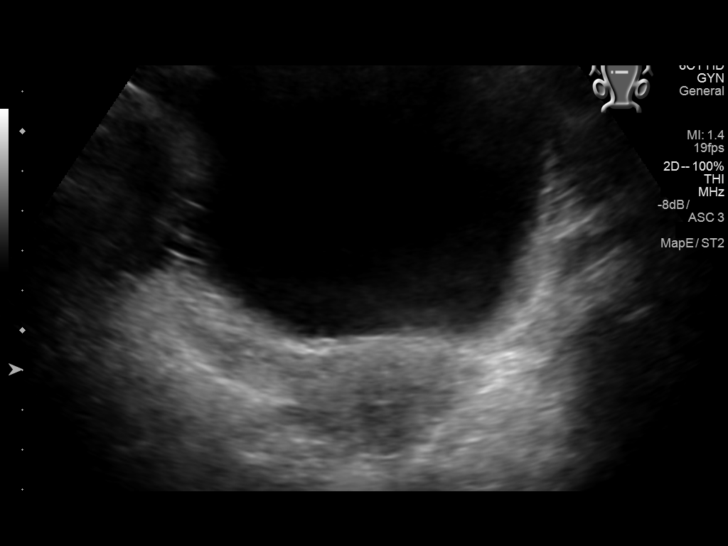
[im 19/50]
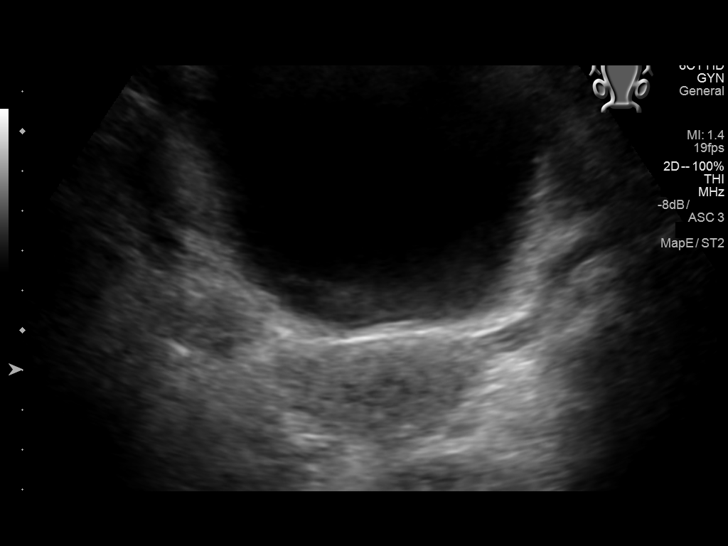
[im 23/50]
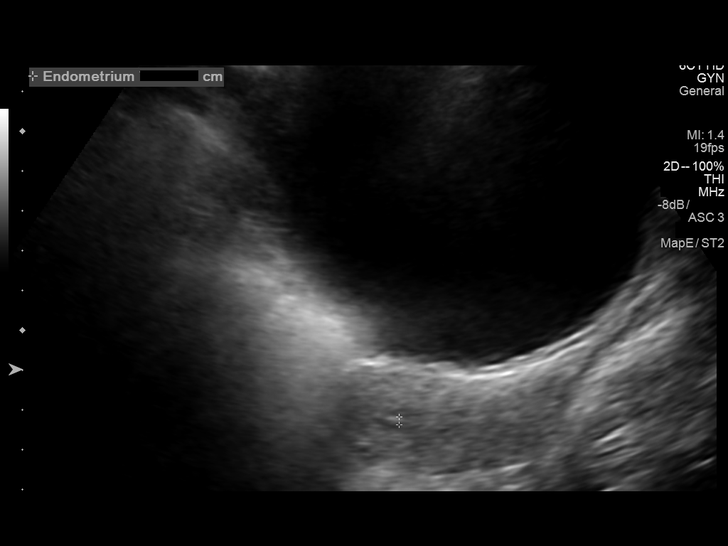
[im 27/50]
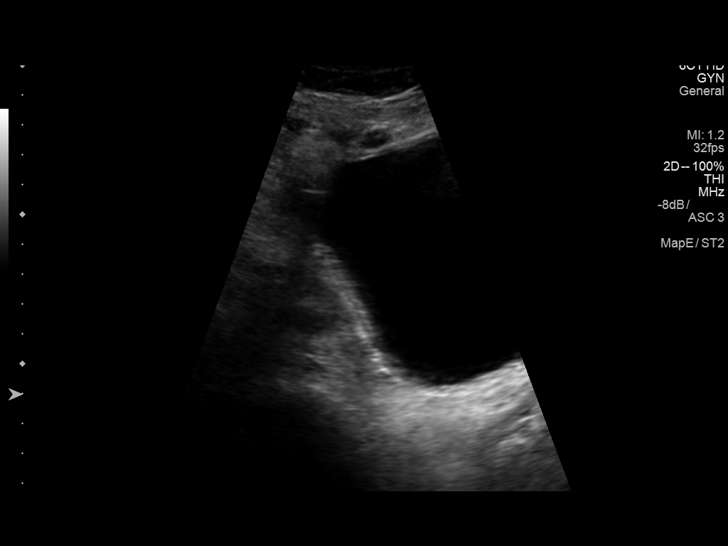
[im 31/50]
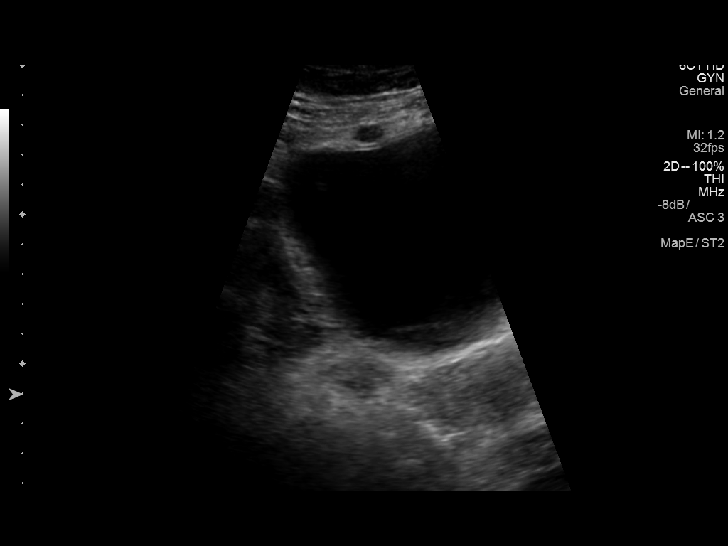
[im 33/50]
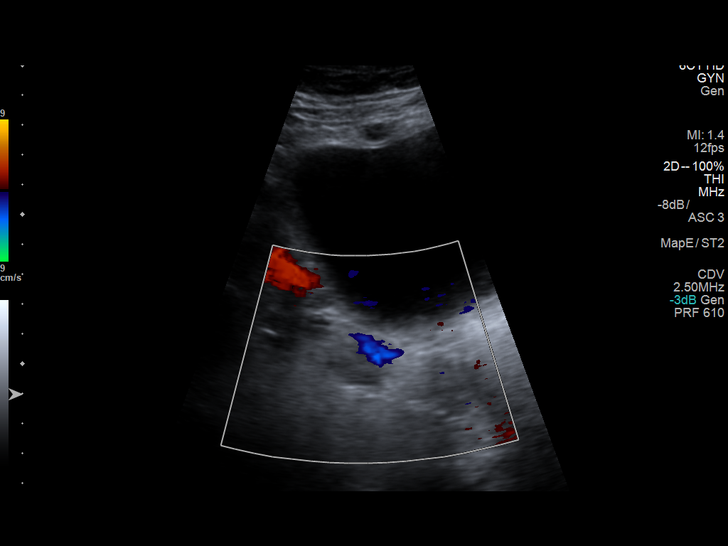
[im 37/50]
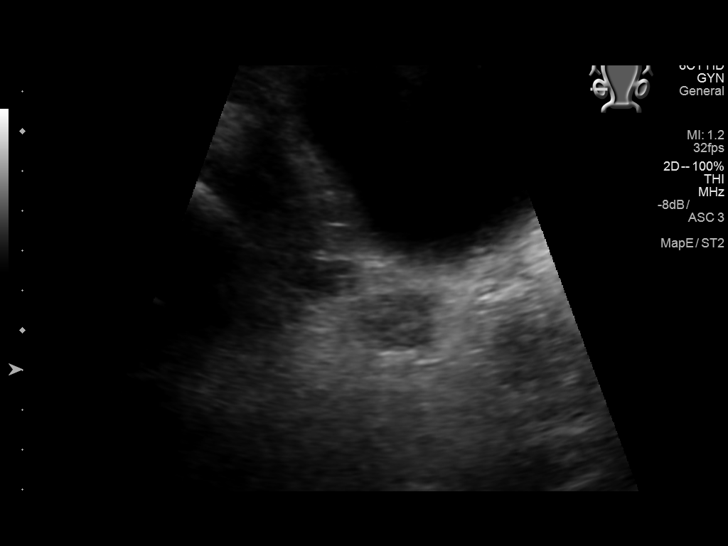
[im 41/50]
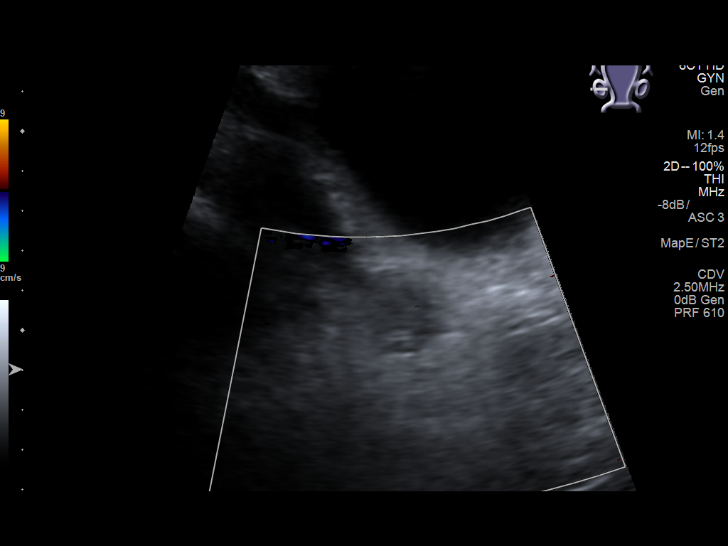
[im 45/50]
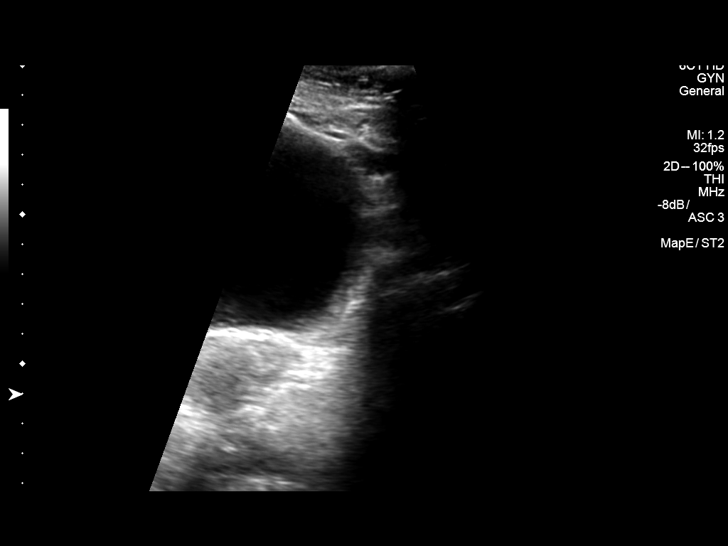
[im 50/50]
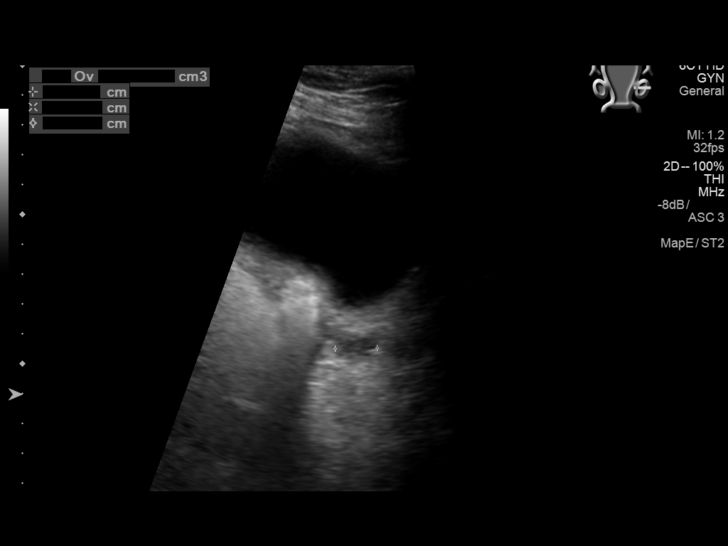

[14 of 25 positions shown; findings below may reference images not displayed]

FINDINGS: Uterus

Measurements: 5.6 x 2.9 x 4.6 cm. No fibroids or other mass
visualized.

Endometrium

Thickness: 2.0 mm.  No focal abnormality visualized.

Right ovary

Measurements: 2.3 x 1.1 x 1.8 cm. Normal appearance/no adnexal mass.

Left ovary

Measurements: 1.7 x 1.0 x 1.4 cm. Normal appearance/no adnexal mass.

Other findings:  No abnormal free fluid.
IMPRESSION: Normal appearance of the uterus, the endometrium, and both ovaries.
No endometrial masses or other abnormalities are observed.

## 2017-12-23 ENCOUNTER — Emergency Department: Payer: Medicaid Other

## 2017-12-23 ENCOUNTER — Encounter: Payer: Self-pay | Admitting: Emergency Medicine

## 2017-12-23 ENCOUNTER — Inpatient Hospital Stay
Admission: EM | Admit: 2017-12-23 | Discharge: 2017-12-25 | DRG: 193 | Disposition: A | Discharge status: Home Health | Payer: Medicaid Other | Attending: Internal Medicine | Admitting: Internal Medicine

## 2017-12-23 ENCOUNTER — Other Ambulatory Visit: Payer: Self-pay

## 2017-12-23 DIAGNOSIS — R509 Fever, unspecified: Secondary | ICD-10-CM | POA: Insufficient documentation

## 2017-12-23 DIAGNOSIS — Z992 Dependence on renal dialysis: Secondary | ICD-10-CM | POA: Diagnosis not present

## 2017-12-23 DIAGNOSIS — E1140 Type 2 diabetes mellitus with diabetic neuropathy, unspecified: Secondary | ICD-10-CM | POA: Diagnosis present

## 2017-12-23 DIAGNOSIS — I120 Hypertensive chronic kidney disease with stage 5 chronic kidney disease or end stage renal disease: Secondary | ICD-10-CM | POA: Diagnosis present

## 2017-12-23 DIAGNOSIS — N186 End stage renal disease: Secondary | ICD-10-CM | POA: Diagnosis present

## 2017-12-23 DIAGNOSIS — Y95 Nosocomial condition: Secondary | ICD-10-CM | POA: Diagnosis present

## 2017-12-23 DIAGNOSIS — E1122 Type 2 diabetes mellitus with diabetic chronic kidney disease: Secondary | ICD-10-CM | POA: Diagnosis present

## 2017-12-23 DIAGNOSIS — R0902 Hypoxemia: Secondary | ICD-10-CM | POA: Diagnosis present

## 2017-12-23 DIAGNOSIS — E118 Type 2 diabetes mellitus with unspecified complications: Secondary | ICD-10-CM | POA: Diagnosis present

## 2017-12-23 DIAGNOSIS — Z794 Long term (current) use of insulin: Secondary | ICD-10-CM | POA: Diagnosis not present

## 2017-12-23 DIAGNOSIS — Z8349 Family history of other endocrine, nutritional and metabolic diseases: Secondary | ICD-10-CM

## 2017-12-23 DIAGNOSIS — M810 Age-related osteoporosis without current pathological fracture: Secondary | ICD-10-CM | POA: Diagnosis present

## 2017-12-23 DIAGNOSIS — J181 Lobar pneumonia, unspecified organism: Secondary | ICD-10-CM | POA: Diagnosis present

## 2017-12-23 DIAGNOSIS — J189 Pneumonia, unspecified organism: Secondary | ICD-10-CM | POA: Diagnosis present

## 2017-12-23 DIAGNOSIS — E785 Hyperlipidemia, unspecified: Secondary | ICD-10-CM | POA: Diagnosis present

## 2017-12-23 DIAGNOSIS — Z8673 Personal history of transient ischemic attack (TIA), and cerebral infarction without residual deficits: Secondary | ICD-10-CM

## 2017-12-23 DIAGNOSIS — Z8249 Family history of ischemic heart disease and other diseases of the circulatory system: Secondary | ICD-10-CM | POA: Diagnosis not present

## 2017-12-23 DIAGNOSIS — Z79899 Other long term (current) drug therapy: Secondary | ICD-10-CM | POA: Diagnosis not present

## 2017-12-23 DIAGNOSIS — N2581 Secondary hyperparathyroidism of renal origin: Secondary | ICD-10-CM | POA: Diagnosis present

## 2017-12-23 DIAGNOSIS — Z7982 Long term (current) use of aspirin: Secondary | ICD-10-CM

## 2017-12-23 DIAGNOSIS — D631 Anemia in chronic kidney disease: Secondary | ICD-10-CM | POA: Diagnosis present

## 2017-12-23 DIAGNOSIS — I10 Essential (primary) hypertension: Secondary | ICD-10-CM | POA: Diagnosis present

## 2017-12-23 DIAGNOSIS — Z79891 Long term (current) use of opiate analgesic: Secondary | ICD-10-CM

## 2017-12-23 DIAGNOSIS — E11319 Type 2 diabetes mellitus with unspecified diabetic retinopathy without macular edema: Secondary | ICD-10-CM | POA: Diagnosis present

## 2017-12-23 LAB — COMPREHENSIVE METABOLIC PANEL
ALBUMIN: 3.5 g/dL (ref 3.5–5.0)
ALK PHOS: 99 U/L (ref 38–126)
ALT: 15 U/L (ref 14–54)
AST: 24 U/L (ref 15–41)
Anion gap: 10 (ref 5–15)
BILIRUBIN TOTAL: 0.8 mg/dL (ref 0.3–1.2)
BUN: 14 mg/dL (ref 6–20)
CALCIUM: 8.7 mg/dL — AB (ref 8.9–10.3)
CO2: 27 mmol/L (ref 22–32)
Chloride: 97 mmol/L — ABNORMAL LOW (ref 101–111)
Creatinine, Ser: 2.63 mg/dL — ABNORMAL HIGH (ref 0.44–1.00)
GFR calc Af Amer: 21 mL/min — ABNORMAL LOW (ref >60)
GFR, EST NON AFRICAN AMERICAN: 18 mL/min — AB (ref >60)
GLUCOSE: 249 mg/dL — AB (ref 65–99)
Potassium: 4.1 mmol/L (ref 3.5–5.1)
Sodium: 134 mmol/L — ABNORMAL LOW (ref 135–145)
TOTAL PROTEIN: 6.7 g/dL (ref 6.5–8.1)

## 2017-12-23 LAB — CBC
HCT: 31 % — ABNORMAL LOW (ref 35.0–47.0)
Hemoglobin: 10.6 g/dL — ABNORMAL LOW (ref 12.0–16.0)
MCH: 32.4 pg (ref 26.0–34.0)
MCHC: 34.2 g/dL (ref 32.0–36.0)
MCV: 94.8 fL (ref 80.0–100.0)
Platelets: 153 10*3/uL (ref 150–440)
RBC: 3.27 MIL/uL — ABNORMAL LOW (ref 3.80–5.20)
RDW: 15 % — AB (ref 11.5–14.5)
WBC: 9.8 10*3/uL (ref 3.6–11.0)

## 2017-12-23 LAB — URINALYSIS, COMPLETE (UACMP) WITH MICROSCOPIC
BACTERIA UA: NONE SEEN
Bilirubin Urine: NEGATIVE
Glucose, UA: 50 mg/dL — AB
Hgb urine dipstick: NEGATIVE
KETONES UR: NEGATIVE mg/dL
Nitrite: NEGATIVE
Specific Gravity, Urine: 1.016 (ref 1.005–1.030)
pH: 7 (ref 5.0–8.0)

## 2017-12-23 LAB — LIPASE, BLOOD: Lipase: 21 U/L (ref 11–51)

## 2017-12-23 MED ORDER — VANCOMYCIN HCL IN DEXTROSE 1-5 GM/200ML-% IV SOLN
1000.0000 mg | Freq: Once | INTRAVENOUS | Status: AC
  Administered 2017-12-24: 1000 mg via INTRAVENOUS
  Filled 2017-12-23: qty 200

## 2017-12-23 MED ORDER — ONDANSETRON HCL 4 MG/2ML IJ SOLN
4.0000 mg | Freq: Once | INTRAMUSCULAR | Status: DC | PRN
  Administered 2017-12-24: 4 mg via INTRAVENOUS
  Filled 2017-12-23: qty 2

## 2017-12-23 MED ORDER — VANCOMYCIN HCL 10 G IV SOLR: 1.0000 g | Freq: Once | INTRAVENOUS | Status: DC

## 2017-12-23 MED ORDER — ONDANSETRON HCL 4 MG/2ML IJ SOLN
4.0000 mg | Freq: Once | INTRAMUSCULAR | Status: AC
  Administered 2017-12-23: 4 mg via INTRAVENOUS
  Filled 2017-12-23: qty 2

## 2017-12-23 MED ORDER — IOPAMIDOL (ISOVUE-300) INJECTION 61%: 15.0000 mL | INTRAVENOUS | Status: AC

## 2017-12-23 MED ORDER — SODIUM CHLORIDE 0.9 % IV BOLUS (SEPSIS)
500.0000 mL | Freq: Once | INTRAVENOUS | Status: AC
  Administered 2017-12-23: 500 mL via INTRAVENOUS

## 2017-12-23 MED ORDER — DEXTROSE 5 % IV SOLN
1.0000 g | Freq: Once | INTRAVENOUS | Status: AC
  Administered 2017-12-23: 1 g via INTRAVENOUS
  Filled 2017-12-23: qty 1

## 2017-12-23 NOTE — ED Provider Notes (Signed)
[facility] Emergency Department Provider Note   ____________________________________________   First MD Initiated Contact with Patient 12/23/17 1506     (approximate)  I have reviewed the triage vital signs and the nursing notes.   HISTORY  Chief Complaint Abdominal Pain  Utilized Spanish interpreter, [place]  HPI Anita Contreras is a 62 y.o. female previous history of chronic kidney disease on dialysis, last treatment was this morning.  Type 2 diabetes, previous stroke.  Patient comes with her daughter.  They report since Sunday she has been experiencing nausea and vomiting with everything trying to eat anything.  She reports that she has minimal discomfort across the lower abdomen.  She does still urinate a small amount.  No fevers or chills.  She does report fatigue.  She reports she feels lightheaded or dizzy when she tries to walk or stand for the last 2-3 days.  She also reports she had a mild to moderate headache around the time that her nausea and vomiting started, but the headache and pain seems to be getting better but she continues to feel lightheaded especially with standing.  She did throw up once this morning.  Denies any black or bloody emesis.  Reports that she throws up "water"  Past Medical History:  Diagnosis Date  . Acute cystitis with hematuria   . Anemia   . Cataracts, bilateral   . CKD (chronic kidney disease)   . Diabetic retinopathy, background ([place])   . Dialysis patient ([place])    Tues, Thurs, Sat  . Dizziness   . Hyperlipidemia   . Hypertension   . Migraine variant   . Neuropathy   . OP (osteoporosis)   . Personal history of noncompliance with medical treatment, presenting hazards to health   . Steal syndrome as complication of dialysis access ([place])   . Stroke ([place])   . Type II diabetes mellitus with ophthalmic manifestations, uncontrolled ([place])   . Vitamin D deficiency     Patient Active Problem List   Diagnosis  Date Noted  . HCAP (healthcare-associated pneumonia) 12/23/2017  . Bradycardia 01/17/2017  . Syncope 01/17/2017  . Nausea and vomiting 01/17/2017  . Essential hypertension, malignant 01/17/2017  . Lactic acidosis 01/17/2017  . Leukocytosis 01/17/2017  . Orthostatic hypotension 01/17/2017  . Hyperkalemia 01/14/2017  . Steal syndrome as complication of dialysis access ([place]) 11/08/2016  . ESRD on dialysis ([place]) 10/15/2016  . Renal dialysis device, implant, or graft complication 00/86/7619  . Hyperlipidemia 10/15/2016  . Essential hypertension 10/15/2016  . Controlled diabetes mellitus type 2 with complications ([place]) 50/93/2671  . Acute on chronic renal failure ([place]) 04/23/2016  . 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 Acute right flank pain   . ARF (acute renal failure) ([place]) 04/15/2016  . Acute cystitis 05/26/2015  . Neuropathy 05/26/2015    Past Surgical History:  Procedure Laterality Date  . AV FISTULA PLACEMENT Right 10/10/2016   Procedure: ARTERIOVENOUS (AV) FISTULA CREATION ( BRACHIOCEPHALIC );  Surgeon: Algernon Huxley, MD;  Location: [facility] ORS;  Service: Vascular;  Laterality: Right;  . DILATION AND CURETTAGE OF UTERUS    . DISTAL REVASCULARIZATION AND INTERVAL LIGATION (DRIL) Right 11/08/2016   Procedure: DISTAL REVASCULARIZATION AND INTERVAL LIGATION (DRIL);  Surgeon: Algernon Huxley, MD;  Location: [facility] ORS;  Service: Vascular;  Laterality: Right;  . PERIPHERAL VASCULAR CATHETERIZATION N/A 04/25/2016   Procedure: Dialysis/Perma Catheter Insertion;  Surgeon: Algernon Huxley, MD;  Location: [place] [facility];  Service: Cardiovascular;  Laterality: N/A;  . PERIPHERAL VASCULAR CATHETERIZATION Right 10/17/2016   Procedure: Upper Extremity Angiography;  Surgeon: Algernon Huxley, MD;  Location: [place] [facility];  Service: Cardiovascular;  Laterality: Right;    Prior to Admission medications   Medication Sig Start Date End Date Taking? Authorizing Provider    acetaminophen (TYLENOL) 500 MG tablet Take 1,000 mg by mouth every 6 (six) hours as needed for mild pain or headache.    [provider]  amLODipine (NORVASC) 10 MG tablet Take 1 tablet (10 mg total) by mouth daily. 04/19/16   Fritzi Mandes, MD  aspirin 81 MG EC tablet TAKE 1 TABLET BY MOUTH EVERY DAY 04/15/17   Stegmayer, Joelene Millin A, PA-C  atorvastatin (LIPITOR) 40 MG tablet Take 40 mg by mouth daily at 6 PM.     [provider]  calcium acetate (PHOSLO) 667 MG capsule Take 1 capsule (667 mg total) by mouth 3 (three) times daily with meals. 04/19/16   Fritzi Mandes, MD  hydrALAZINE (APRESOLINE) 25 MG tablet Take 1 tablet (25 mg total) by mouth 3 (three) times daily. 04/19/16   Fritzi Mandes, MD  hydrOXYzine (ATARAX/VISTARIL) 25 MG tablet Take 25 mg by mouth 2 (two) times daily as needed for itching.    [provider]  insulin NPH-regular Human (NOVOLIN 70/30) (70-30) 100 UNIT/ML injection Inject 6 Units into the skin 2 (two) times daily with a meal.     [provider]  losartan (COZAAR) 50 MG tablet Take 1 tablet (50 mg total) by mouth daily. 05/07/16   Epifanio Lesches, MD  Nutritional Supplements (FEEDING SUPPLEMENT, NEPRO CARB STEADY,) LIQD Take 237 mLs by mouth 2 (two) times daily between meals. 05/07/16   Epifanio Lesches, MD  oxyCODONE-acetaminophen (PERCOCET) 7.5-325 MG tablet One to Two Tabs Every Four to Six Hours As Needed For Pain 11/10/16   Stegmayer, Joelene Millin A, PA-C  traMADol (ULTRAM) 50 MG tablet Take 1 tablet (50 mg total) by mouth every 6 (six) hours as needed. 05/22/16   Daymon Larsen, MD    Allergies Patient has no known allergies.  Family History  Problem Relation Age of Onset  . Hypertension Mother   . Hyperlipidemia Mother     Social History Social History   Tobacco Use  . Smoking status: Never Smoker  . Smokeless tobacco: Never Used  Substance Use Topics  . Alcohol use: No  . Drug use: No    Review of  Systems Constitutional: No fever/chills Eyes: No visual changes. ENT: No sore throat.  Reports mouth feels dry. Cardiovascular: Denies chest pain. Respiratory: Denies shortness of breath.  No cough. Gastrointestinal: Denies distention.  No constipation. Genitourinary: Negative for dysuria reports she only urinates a very small amount. Musculoskeletal: Negative for back pain. Skin: Negative for rash. Neurological: Negative for focal weakness or numbness.  No neck pain or stiffness.    ____________________________________________   PHYSICAL EXAM:  VITAL SIGNS: ED Triage Vitals  Enc Vitals Group     BP 12/23/17 1333 (!) 178/56     Pulse Rate 12/23/17 1333 92     Resp 12/23/17 1333 12     Temp 12/23/17 1333 99.4 F (37.4 C)     Temp Source 12/23/17 1333 Oral     SpO2 12/23/17 1333 93 %     Weight 12/23/17 1333 100 lb (45.4 kg)     Height 12/23/17 1333 4\' 8"  (1.422 m)     Head Circumference --      Peak Flow --  Pain Score 12/23/17 1351 10     Pain Loc --      Pain Edu? --      Excl. in [place]? --     Constitutional: Alert and oriented. Well appearing and in no acute distress.  Does appear chronically ill.  Mildly fatigued.  Somewhat soft spoken. Eyes: Conjunctivae are normal. Head: Atraumatic. Nose: No congestion/rhinnorhea. Mouth/Throat: Mucous membranes are dry. Neck: No stridor.  No meningismus Cardiovascular: Normal rate, regular rhythm. Grossly normal heart sounds.  Good peripheral circulation. Respiratory: Normal respiratory effort.  No retractions. Lungs CTAB. Gastrointestinal: Soft and nontender except she does report some moderate tenderness suprapubically without rebound or guarding. No distention. Musculoskeletal: No lower extremity tenderness nor edema. Neurologic:  Normal speech and language. No gross focal neurologic deficits are appreciated.  Skin:  Skin is warm, dry and intact. No rash noted. Psychiatric: Mood and affect are normal. Speech and behavior  are normal.  ____________________________________________   LABS (all labs ordered are listed, but only abnormal results are displayed)  Labs Reviewed  COMPREHENSIVE METABOLIC PANEL - Abnormal; Notable for the following components:      Result Value   Sodium 134 (*)    Chloride 97 (*)    Glucose, Bld 249 (*)    Creatinine, Ser 2.63 (*)    Calcium 8.7 (*)    GFR calc non Af Amer 18 (*)    GFR calc Af Amer 21 (*)    All other components within normal limits  CBC - Abnormal; Notable for the following components:   RBC 3.27 (*)    Hemoglobin 10.6 (*)    HCT 31.0 (*)    RDW 15.0 (*)    All other components within normal limits  URINALYSIS, COMPLETE (UACMP) WITH MICROSCOPIC - Abnormal; Notable for the following components:   Color, Urine AMBER (*)    APPearance CLOUDY (*)    Glucose, UA 50 (*)    Protein, ur >=300 (*)    Leukocytes, UA LARGE (*)    Squamous Epithelial / LPF TOO NUMEROUS TO COUNT (*)    All other components within normal limits  CULTURE, BLOOD (ROUTINE X 2)  CULTURE, BLOOD (ROUTINE X 2)  LIPASE, BLOOD   ____________________________________________  EKG  Reviewed and interpreted by me at 1340 Heart rate 90 QRS 70 QTC 430 Normal sinus rhythm, no evidence of ischemia.  Left axis.  Probable LVH. ____________________________________________  RADIOLOGY  Ct Abdomen Pelvis Wo Contrast  Result Date: 12/23/2017 CLINICAL DATA:  Nausea and vomiting EXAM: CT ABDOMEN AND PELVIS WITHOUT CONTRAST TECHNIQUE: Multidetector CT imaging of the abdomen and pelvis was performed following the standard protocol without IV contrast. COMPARISON:  CT abdomen pelvis 04/15/2016 FINDINGS: Lower chest: Partially visualized consolidation and adjacent ground-glass density in the right lower lobe with 2 additional foci of nodular density, also in the right lower lobe. No significant pleural effusion. Heart size borderline enlarged. Hepatobiliary: No focal liver abnormality is seen. No  gallstones, gallbladder wall thickening, or biliary dilatation. Pancreas: Unremarkable. No pancreatic ductal dilatation or surrounding inflammatory changes. Spleen: Normal in size without focal abnormality. Adrenals/Urinary Tract: Adrenal glands are unremarkable. Kidneys are normal, without renal calculi, focal lesion, or hydronephrosis. Bladder is unremarkable. Stomach/Bowel: Stomach is within normal limits. Appendix appears normal. No evidence of bowel wall thickening, distention, or inflammatory changes. Vascular/Lymphatic: Moderate severe aortic atherosclerosis. No aneurysmal dilatation. No significantly enlarged lymph nodes. Reproductive: Uterus and bilateral adnexa are unremarkable. Other: Negative for free air or free fluid. Fat in the  umbilical region Musculoskeletal: No acute or suspicious bony finding IMPRESSION: 1. Negative for bowel obstruction, appendicitis, or bowel wall thickening 2. Partially visualized consolidative process in the right lower lobe with adjacent ground-glass density and additional foci of nodular airspace disease, findings are suspicious for a pneumonia. Recommend 6-12 week CT follow-up following adequate antibiotic trial to ensure resolution and exclude underlying mass. Electronically Signed   By: Donavan Foil M.D.   On: 12/23/2017 17:44   Dg Chest 2 View  Result Date: 12/23/2017 CLINICAL DATA:  abd pain and vomiting since Sunday. Says she vomits every thing. No diarrhea. Says she feels dizzy. Never a smoker. Hx CKD, DM, CAD EXAM: CHEST  2 VIEW COMPARISON:  01/15/2017 FINDINGS: The cardiac silhouette is mildly enlarged. No mediastinal or hilar masses. No convincing adenopathy. There are prominent bilateral bronchovascular markings similar to prior studies. No overt pulmonary edema. No evidence of pneumonia. No pleural effusion or pneumothorax. Skeletal structures are intact. IMPRESSION: No acute cardiopulmonary disease. Electronically Signed   By: Lajean Manes M.D.   On:  12/23/2017 18:28   Ct Head Wo Contrast  Result Date: 12/23/2017 CLINICAL DATA:  Dizziness with nausea and vomiting EXAM: CT HEAD WITHOUT CONTRAST TECHNIQUE: Contiguous axial images were obtained from the base of the skull through the vertex without intravenous contrast. COMPARISON:  01/14/2017 head CT FINDINGS: Brain: No evidence of acute infarction, hemorrhage, hydrocephalus, extra-axial collection or mass lesion/mass effect. Vascular: No hyperdense vessel or unexpected calcification. Skull: Normal. Negative for fracture or focal lesion. Sinuses/Orbits: Mild mucosal thickening in the ethmoid sinuses. No acute orbital abnormality Other: None IMPRESSION: No CT evidence for acute intracranial abnormality. Negative non contrasted CT examination of the brain. Electronically Signed   By: Donavan Foil M.D.   On: 12/23/2017 17:47    Chest x-ray clear, CT reviewed notable right lower lobe pneumonia suspected ____________________________________________   PROCEDURES  Procedure(s) performed: None  Procedures  Critical Care performed: No  ____________________________________________   INITIAL IMPRESSION / ASSESSMENT AND PLAN / ED COURSE  Pertinent labs & imaging results that were available during my care of the patient were reviewed by me and considered in my medical decision making (see chart for details).    Clinical Course as of Dec 24 2307  Tue Dec 23, 2017  1919 Patient resting comfortably.  CT scan reassuring with regard to abdominal, no further vomiting or nausea in the ER.  She is alert, family at bedside.  She goes to [facility] nephrology, I have paged Dr. Rowland Lathe to discuss the patient's case as I suspect she has pneumonia which may leading to her low-grade temperature as well as symptoms.  She exhibits no signs of sepsis, no significant leukocytosis, no elevation of heart rate, no hypoxia.  Plan to initiate antibiotics here, discussed with Dr. Rowland Lathe and likely have her continue outpatient  dialysis with antibiotics and close follow-up with [facility] nephrology.  [MQ]  1951 Awaiting callback from [facility] nephrology, have re-paged.  [MQ]  2216 Patient noted to have some mild hypoxia when resting.  At this point, oxygen saturation does drop to about 86-87%, given the diagnosis of pneumonia plan to admit her and discussed with Dr. Candiss Norse (nephrology) who advises on antibiotics and dosing.   [MQ]    Clinical Course User Index [MQ] Delman Kitten, MD   Differential diagnosis includes but is not limited to, abdominal perforation, aortic dissection, cholecystitis, appendicitis, diverticulitis, colitis, esophagitis/gastritis, kidney stone, pyelonephritis, urinary tract infection, aortic aneurysm. All are considered in decision and treatment plan.  Based upon the patient's presentation and risk factors, given the patient is on hemodialysis I will obtain a CT scan with oral contrast only.  She does still urinate minimally.  Does not wish to cause risk to her remaining kidney function with she denies any cardiac or pulmonary symptoms.  Is resting comfortably, when asked if she would like any pain medicine or she reports she does not at the present time.  Her exam is reassuring without peritonitis present suprapubic tenderness, questionably a very low-grade temp.  Certainly we will send a urinalysis, consider urinary tract infection, intra-abdominal etiologies, far less likely felt to be neurological she did report a headache around the time of the onset of nausea and vomiting and given her history of hypertension and hemodialysis, will obtain a CT of the head to exclude intracranial hemorrhage.  She had hemodialysis this morning but reports feeling very lightheaded and dizzy with walking.  Has a history of orthostatic hypotension.  We will give light hydration here and continue to monitor closely.  ----------------------------------------- 11:09 PM on  12/23/2017 -----------------------------------------  Discussed with patient Via Spanish interpreter, she is agreeable with plan for admission.  She will be admitted due to mild hypoxia noted while resting, in the setting of being on dialysis as well as having pneumonia with mild hypoxia she appears a good candidate for inpatient treatment.  Discussed antibiotic choices with nephrology Dr. Candiss Norse.  She is presently resting comfortably agreeable with plan for admission.  Case discussed with Dr. Jannifer Franklin of the hospitalist service ____________________________________________   FINAL CLINICAL IMPRESSION(S) / ED DIAGNOSES  Final diagnoses:  Lobar pneumonia ([place])  ESRD (end stage renal disease) ([place])  Community acquired pneumonia of right lower lobe of lung ([place])      NEW MEDICATIONS STARTED DURING THIS VISIT:  New Prescriptions   No medications on file     Note:  This document was prepared using Dragon voice recognition software and may include unintentional dictation errors.     Delman Kitten, MD 12/23/17 2310

## 2017-12-23 NOTE — ED Notes (Signed)
Pt up to toilet in room, per pt and family request. Urine obtained and sent at this time.

## 2017-12-23 NOTE — ED Notes (Signed)
Pt placed on bedpan at this time. Pt was educated on the need for urine or we would have to I&O cath, with interpreter. Pt request to try on her own. RN will montior

## 2017-12-23 NOTE — ED Triage Notes (Signed)
abd pain and vomiting since Sunday.  Says she vomits every thing.  No diarrhea.  Says she feels dizzy.

## 2017-12-23 NOTE — ED Notes (Signed)
Patient transported to CT 

## 2017-12-23 NOTE — ED Notes (Signed)
O2 applied on pt via nasal cannula at 2L per MD Quale. Pt currently in 89% before O2 and now currently 94%

## 2017-12-23 NOTE — ED Notes (Addendum)
Pt assisted to bathroom. Pt was placed on 2L O2 for on O2 stat of 89. EDP aware.

## 2017-12-23 NOTE — H&P (Signed)
[place] [facility] [place] NAME: Zuleyka Kloc    MR#:  [id number]  DATE OF BIRTH:  10-Apr-1956  DATE OF ADMISSION:  12/23/2017  PRIMARY CARE PHYSICIAN: Arlis Porta, FNP   REQUESTING/REFERRING PHYSICIAN: Jacqualine Code, MD  CHIEF COMPLAINT:   Chief Complaint  Patient presents with  . Abdominal Pain    HISTORY OF PRESENT ILLNESS:  Lizette Pazos  is a Spanish-speaking 62 y.o. female who presents with 2 days of nausea vomiting, fever with chills, cough.  Here in the ED the patient was found on CT scan to have basilar pneumonia.  She is a dialysis patient.  Hospitalist were called for admission and treatment.  PAST MEDICAL HISTORY:   Past Medical History:  Diagnosis Date  . Acute cystitis with hematuria   . Anemia   . Cataracts, bilateral   . CKD (chronic kidney disease)   . Diabetic retinopathy, background ([place])   . Dialysis patient ([place])    Tues, Thurs, Sat  . Dizziness   . Hyperlipidemia   . Hypertension   . Migraine variant   . Neuropathy   . OP (osteoporosis)   . Personal history of noncompliance with medical treatment, presenting hazards to health   . Steal syndrome as complication of dialysis access ([place])   . Stroke ([place])   . Type II diabetes mellitus with ophthalmic manifestations, uncontrolled ([place])   . Vitamin D deficiency     PAST SURGICAL HISTORY:   Past Surgical History:  Procedure Laterality Date  . AV FISTULA PLACEMENT Right 10/10/2016   Procedure: ARTERIOVENOUS (AV) FISTULA CREATION ( BRACHIOCEPHALIC );  Surgeon: Algernon Huxley, MD;  Location: [facility] ORS;  Service: Vascular;  Laterality: Right;  . DILATION AND CURETTAGE OF UTERUS    . DISTAL REVASCULARIZATION AND INTERVAL LIGATION (DRIL) Right 11/08/2016   Procedure: DISTAL REVASCULARIZATION AND INTERVAL LIGATION (DRIL);  Surgeon: Algernon Huxley, MD;  Location: [facility] ORS;  Service: Vascular;  Laterality: Right;  . PERIPHERAL VASCULAR CATHETERIZATION  N/A 04/25/2016   Procedure: Dialysis/Perma Catheter Insertion;  Surgeon: Algernon Huxley, MD;  Location: [place] [facility];  Service: Cardiovascular;  Laterality: N/A;  . PERIPHERAL VASCULAR CATHETERIZATION Right 10/17/2016   Procedure: Upper Extremity Angiography;  Surgeon: Algernon Huxley, MD;  Location: [place] [facility];  Service: Cardiovascular;  Laterality: Right;    SOCIAL HISTORY:   Social History   Tobacco Use  . Smoking status: Never Smoker  . Smokeless tobacco: Never Used  Substance Use Topics  . Alcohol use: No    FAMILY HISTORY:   Family History  Problem Relation Age of Onset  . Hypertension Mother   . Hyperlipidemia Mother     DRUG ALLERGIES:  No Known Allergies  MEDICATIONS AT HOME:   Prior to Admission medications   Medication Sig Start Date End Date Taking? Authorizing Provider  acetaminophen (TYLENOL) 500 MG tablet Take 1,000 mg by mouth every 6 (six) hours as needed for mild pain or headache.    [provider]  amLODipine (NORVASC) 10 MG tablet Take 1 tablet (10 mg total) by mouth daily. 04/19/16   Fritzi Mandes, MD  aspirin 81 MG EC tablet TAKE 1 TABLET BY MOUTH EVERY DAY 04/15/17   Stegmayer, Joelene Millin A, PA-C  atorvastatin (LIPITOR) 40 MG tablet Take 40 mg by mouth daily at 6 PM.     [provider]  calcium acetate (PHOSLO) 667 MG capsule Take 1 capsule (667 mg total) by mouth 3 (three)  times daily with meals. 04/19/16   Fritzi Mandes, MD  hydrALAZINE (APRESOLINE) 25 MG tablet Take 1 tablet (25 mg total) by mouth 3 (three) times daily. 04/19/16   Fritzi Mandes, MD  hydrOXYzine (ATARAX/VISTARIL) 25 MG tablet Take 25 mg by mouth 2 (two) times daily as needed for itching.    [provider]  insulin NPH-regular Human (NOVOLIN 70/30) (70-30) 100 UNIT/ML injection Inject 6 Units into the skin 2 (two) times daily with a meal.     [provider]  losartan (COZAAR) 50 MG tablet Take 1 tablet (50 mg total) by mouth daily. 05/07/16   Epifanio Lesches, MD  Nutritional Supplements (FEEDING SUPPLEMENT, NEPRO CARB STEADY,) LIQD Take 237 mLs by mouth 2 (two) times daily between meals. 05/07/16   Epifanio Lesches, MD  oxyCODONE-acetaminophen (PERCOCET) 7.5-325 MG tablet One to Two Tabs Every Four to Six Hours As Needed For Pain 11/10/16   Stegmayer, Joelene Millin A, PA-C  traMADol (ULTRAM) 50 MG tablet Take 1 tablet (50 mg total) by mouth every 6 (six) hours as needed. 05/22/16   Daymon Larsen, MD    REVIEW OF SYSTEMS:  Review of Systems  Constitutional: Positive for chills and fever. Negative for malaise/fatigue and weight loss.  HENT: Negative for ear pain, hearing loss and tinnitus.   Eyes: Negative for blurred vision, double vision, pain and redness.  Respiratory: Positive for cough. Negative for hemoptysis and shortness of breath.   Cardiovascular: Negative for chest pain, palpitations, orthopnea and leg swelling.  Gastrointestinal: Positive for abdominal pain, nausea and vomiting. Negative for constipation and diarrhea.  Genitourinary: Negative for dysuria, frequency and hematuria.  Musculoskeletal: Negative for back pain, joint pain and neck pain.  Skin:       No acne, rash, or lesions  Neurological: Negative for dizziness, tremors, focal weakness and weakness.  Endo/Heme/Allergies: Negative for polydipsia. Does not bruise/bleed easily.  Psychiatric/Behavioral: Negative for depression. The patient is not nervous/anxious and does not have insomnia.      VITAL SIGNS:   Vitals:   12/23/17 1930 12/23/17 2000 12/23/17 2030 12/23/17 2159  BP: (!) 185/71 (!) 174/63 (!) 172/67   Pulse:   88   Resp:   14   Temp:   99 F (37.2 C)   TempSrc:   Oral   SpO2:   94% (!) 89%  Weight:      Height:       Wt Readings from Last 3 Encounters:  12/23/17 45.4 kg (100 lb)  01/24/17 49 kg (108 lb)  01/17/17 47.1 kg (103 lb 12.8 oz)    PHYSICAL EXAMINATION:  Physical Exam  Vitals reviewed. Constitutional: She is oriented to person,  place, and time. She appears well-developed and well-nourished. No distress.  HENT:  Head: Normocephalic and atraumatic.  Dry mucous membranes  Eyes: Conjunctivae and EOM are normal. Pupils are equal, round, and reactive to light. No scleral icterus.  Neck: Normal range of motion. Neck supple. No JVD present. No thyromegaly present.  Cardiovascular: Normal rate, regular rhythm and intact distal pulses. Exam reveals no gallop and no friction rub.  No murmur heard. Respiratory: Effort normal. No respiratory distress. She has no wheezes. She has no rales.  Basilar rhonchi  GI: Soft. Bowel sounds are normal. She exhibits no distension. There is no tenderness.  Musculoskeletal: Normal range of motion. She exhibits no edema.  No arthritis, no gout  Lymphadenopathy:    She has no cervical adenopathy.  Neurological: She is alert and oriented to  person, place, and time. No cranial nerve deficit.  No dysarthria, no aphasia  Skin: Skin is warm and dry. No rash noted. No erythema.  Psychiatric: She has a normal mood and affect. Her behavior is normal. Judgment and thought content normal.    LABORATORY PANEL:   CBC Recent Labs  Lab 12/23/17 1406  WBC 9.8  HGB 10.6*  HCT 31.0*  PLT 153   ------------------------------------------------------------------------------------------------------------------  Chemistries  Recent Labs  Lab 12/23/17 1406  NA 134*  K 4.1  CL 97*  CO2 27  GLUCOSE 249*  BUN 14  CREATININE 2.63*  CALCIUM 8.7*  AST 24  ALT 15  ALKPHOS 99  BILITOT 0.8   ------------------------------------------------------------------------------------------------------------------  Cardiac Enzymes No results for input(s): TROPONINI in the last 168 hours. ------------------------------------------------------------------------------------------------------------------  RADIOLOGY:  Ct Abdomen Pelvis Wo Contrast  Result Date: 12/23/2017 CLINICAL DATA:  Nausea and  vomiting EXAM: CT ABDOMEN AND PELVIS WITHOUT CONTRAST TECHNIQUE: Multidetector CT imaging of the abdomen and pelvis was performed following the standard protocol without IV contrast. COMPARISON:  CT abdomen pelvis 04/15/2016 FINDINGS: Lower chest: Partially visualized consolidation and adjacent ground-glass density in the right lower lobe with 2 additional foci of nodular density, also in the right lower lobe. No significant pleural effusion. Heart size borderline enlarged. Hepatobiliary: No focal liver abnormality is seen. No gallstones, gallbladder wall thickening, or biliary dilatation. Pancreas: Unremarkable. No pancreatic ductal dilatation or surrounding inflammatory changes. Spleen: Normal in size without focal abnormality. Adrenals/Urinary Tract: Adrenal glands are unremarkable. Kidneys are normal, without renal calculi, focal lesion, or hydronephrosis. Bladder is unremarkable. Stomach/Bowel: Stomach is within normal limits. Appendix appears normal. No evidence of bowel wall thickening, distention, or inflammatory changes. Vascular/Lymphatic: Moderate severe aortic atherosclerosis. No aneurysmal dilatation. No significantly enlarged lymph nodes. Reproductive: Uterus and bilateral adnexa are unremarkable. Other: Negative for free air or free fluid. Fat in the umbilical region Musculoskeletal: No acute or suspicious bony finding IMPRESSION: 1. Negative for bowel obstruction, appendicitis, or bowel wall thickening 2. Partially visualized consolidative process in the right lower lobe with adjacent ground-glass density and additional foci of nodular airspace disease, findings are suspicious for a pneumonia. Recommend 6-12 week CT follow-up following adequate antibiotic trial to ensure resolution and exclude underlying mass. Electronically Signed   By: Donavan Foil M.D.   On: 12/23/2017 17:44   Dg Chest 2 View  Result Date: 12/23/2017 CLINICAL DATA:  abd pain and vomiting since Sunday. Says she vomits every  thing. No diarrhea. Says she feels dizzy. Never a smoker. Hx CKD, DM, CAD EXAM: CHEST  2 VIEW COMPARISON:  01/15/2017 FINDINGS: The cardiac silhouette is mildly enlarged. No mediastinal or hilar masses. No convincing adenopathy. There are prominent bilateral bronchovascular markings similar to prior studies. No overt pulmonary edema. No evidence of pneumonia. No pleural effusion or pneumothorax. Skeletal structures are intact. IMPRESSION: No acute cardiopulmonary disease. Electronically Signed   By: Lajean Manes M.D.   On: 12/23/2017 18:28   Ct Head Wo Contrast  Result Date: 12/23/2017 CLINICAL DATA:  Dizziness with nausea and vomiting EXAM: CT HEAD WITHOUT CONTRAST TECHNIQUE: Contiguous axial images were obtained from the base of the skull through the vertex without intravenous contrast. COMPARISON:  01/14/2017 head CT FINDINGS: Brain: No evidence of acute infarction, hemorrhage, hydrocephalus, extra-axial collection or mass lesion/mass effect. Vascular: No hyperdense vessel or unexpected calcification. Skull: Normal. Negative for fracture or focal lesion. Sinuses/Orbits: Mild mucosal thickening in the ethmoid sinuses. No acute orbital abnormality Other: None IMPRESSION: No CT evidence for  acute intracranial abnormality. Negative non contrasted CT examination of the brain. Electronically Signed   By: Donavan Foil M.D.   On: 12/23/2017 17:47    EKG:   Orders placed or performed during the hospital encounter of 12/23/17  . EKG 12-Lead  . EKG 12-Lead    IMPRESSION AND PLAN:  Principal Problem:   HCAP (healthcare-associated pneumonia) -broad IV antibiotics started, PRN supportive treatment Active Problems:   ESRD on dialysis [facility]) -nephrology consult for dialysis support   Essential hypertension -continue home meds   Controlled diabetes mellitus type 2 with complications ([place]) -sliding scale insulin with corresponding glucose checks  All the records are reviewed and case discussed with ED  provider. Management plans discussed with the patient and/or family.  DVT PROPHYLAXIS: SubQ heparin  GI PROPHYLAXIS: None  ADMISSION STATUS: Inpatient  CODE STATUS: Full Code Status History    Date Active Date Inactive Code Status Order ID Comments User Context   01/14/2017 09:25 01/17/2017 18:03 Full Code [id number]  Laverle Hobby, MD ED   11/08/2016 18:08 11/10/2016 17:54 Full Code [id number]  Algernon Huxley, MD Inpatient   10/17/2016 13:19 10/17/2016 19:24 Full Code [id number]  Algernon Huxley, MD Inpatient   04/23/2016 20:29 05/07/2016 20:01 Full Code [id number]  Vaughan Basta, MD Inpatient   04/15/2016 19:18 04/19/2016 16:08 Full Code [id number]  Gladstone Lighter, MD Inpatient      TOTAL TIME TAKING CARE OF THIS PATIENT: 45 minutes.   Naomi Castrogiovanni Francis 12/23/2017, 11:12 PM  [employer]  [phone]  CC: Primary care physician; Arlis Porta, FNP  Note:  This document was prepared using Dragon voice recognition software and may include unintentional dictation errors.

## 2017-12-23 NOTE — ED Notes (Signed)
Pt receiving contrast oral at this time with interpreter at bedside with  [facility] rad tech.

## 2017-12-24 ENCOUNTER — Other Ambulatory Visit: Payer: Self-pay

## 2017-12-24 LAB — CBC
HEMATOCRIT: 28.7 % — AB (ref 35.0–47.0)
HEMOGLOBIN: 9.7 g/dL — AB (ref 12.0–16.0)
MCH: 32 pg (ref 26.0–34.0)
MCHC: 33.7 g/dL (ref 32.0–36.0)
MCV: 95 fL (ref 80.0–100.0)
Platelets: 167 10*3/uL (ref 150–440)
RBC: 3.02 MIL/uL — ABNORMAL LOW (ref 3.80–5.20)
RDW: 14.5 % (ref 11.5–14.5)
WBC: 7.6 10*3/uL (ref 3.6–11.0)

## 2017-12-24 LAB — GLUCOSE, CAPILLARY
GLUCOSE-CAPILLARY: 162 mg/dL — AB (ref 65–99)
Glucose-Capillary: 130 mg/dL — ABNORMAL HIGH (ref 65–99)
Glucose-Capillary: 132 mg/dL — ABNORMAL HIGH (ref 65–99)
Glucose-Capillary: 228 mg/dL — ABNORMAL HIGH (ref 65–99)
Glucose-Capillary: 239 mg/dL — ABNORMAL HIGH (ref 65–99)

## 2017-12-24 LAB — BASIC METABOLIC PANEL WITH GFR
Anion gap: 12 (ref 5–15)
BUN: 22 mg/dL — ABNORMAL HIGH (ref 6–20)
CO2: 24 mmol/L (ref 22–32)
Calcium: 7.9 mg/dL — ABNORMAL LOW (ref 8.9–10.3)
Chloride: 96 mmol/L — ABNORMAL LOW (ref 101–111)
Creatinine, Ser: 3.89 mg/dL — ABNORMAL HIGH (ref 0.44–1.00)
GFR calc Af Amer: 13 mL/min — ABNORMAL LOW
GFR calc non Af Amer: 12 mL/min — ABNORMAL LOW
Glucose, Bld: 156 mg/dL — ABNORMAL HIGH (ref 65–99)
Potassium: 3.7 mmol/L (ref 3.5–5.1)
Sodium: 132 mmol/L — ABNORMAL LOW (ref 135–145)

## 2017-12-24 LAB — MRSA PCR SCREENING: MRSA by PCR: NEGATIVE

## 2017-12-24 LAB — INFLUENZA PANEL BY PCR (TYPE A & B)
INFLAPCR: NEGATIVE
INFLBPCR: NEGATIVE

## 2017-12-24 MED ORDER — HYDROXYZINE HCL 25 MG PO TABS
25.0000 mg | ORAL_TABLET | Freq: Two times a day (BID) | ORAL | Status: DC | PRN
  Filled 2017-12-24: qty 1

## 2017-12-24 MED ORDER — INSULIN ASPART 100 UNIT/ML ~~LOC~~ SOLN
0.0000 [IU] | Freq: Three times a day (TID) | SUBCUTANEOUS | Status: DC
  Administered 2017-12-24: 3 [IU] via SUBCUTANEOUS
  Administered 2017-12-24: 1 [IU] via SUBCUTANEOUS
  Administered 2017-12-24: 3 [IU] via SUBCUTANEOUS
  Filled 2017-12-24 (×3): qty 1

## 2017-12-24 MED ORDER — HYDRALAZINE HCL 25 MG PO TABS
25.0000 mg | ORAL_TABLET | Freq: Three times a day (TID) | ORAL | Status: DC
  Administered 2017-12-24 (×3): 25 mg via ORAL
  Filled 2017-12-24 (×3): qty 1

## 2017-12-24 MED ORDER — OXYCODONE-ACETAMINOPHEN 7.5-325 MG PO TABS: 1.0000 | ORAL_TABLET | Freq: Four times a day (QID) | ORAL | Status: DC | PRN

## 2017-12-24 MED ORDER — DEXTROSE 5 % IV SOLN
500.0000 mg | INTRAVENOUS | Status: DC
  Filled 2017-12-24: qty 0.5

## 2017-12-24 MED ORDER — TRAMADOL HCL 50 MG PO TABS: 50.0000 mg | ORAL_TABLET | Freq: Two times a day (BID) | ORAL | Status: DC | PRN

## 2017-12-24 MED ORDER — ACETAMINOPHEN 325 MG PO TABS: 650.0000 mg | ORAL_TABLET | Freq: Four times a day (QID) | ORAL | Status: DC | PRN

## 2017-12-24 MED ORDER — HEPARIN SODIUM (PORCINE) 5000 UNIT/ML IJ SOLN
5000.0000 [IU] | Freq: Three times a day (TID) | INTRAMUSCULAR | Status: DC
  Administered 2017-12-24 – 2017-12-25 (×3): 5000 [IU] via SUBCUTANEOUS
  Filled 2017-12-24 (×4): qty 1

## 2017-12-24 MED ORDER — ONDANSETRON HCL 4 MG/2ML IJ SOLN: 4.0000 mg | Freq: Four times a day (QID) | INTRAMUSCULAR | Status: DC | PRN

## 2017-12-24 MED ORDER — ASPIRIN EC 81 MG PO TBEC
81.0000 mg | DELAYED_RELEASE_TABLET | Freq: Every day | ORAL | Status: DC
  Administered 2017-12-24: 81 mg via ORAL
  Filled 2017-12-24: qty 1

## 2017-12-24 MED ORDER — SODIUM CHLORIDE 0.9 % IV SOLN
1.5000 g | Freq: Two times a day (BID) | INTRAVENOUS | Status: DC
  Administered 2017-12-24 – 2017-12-25 (×2): 1.5 g via INTRAVENOUS
  Filled 2017-12-24 (×4): qty 1.5

## 2017-12-24 MED ORDER — ACETAMINOPHEN 650 MG RE SUPP: 650.0000 mg | Freq: Four times a day (QID) | RECTAL | Status: DC | PRN

## 2017-12-24 MED ORDER — ATORVASTATIN CALCIUM 20 MG PO TABS
40.0000 mg | ORAL_TABLET | Freq: Every day | ORAL | Status: DC
  Administered 2017-12-24: 40 mg via ORAL
  Filled 2017-12-24: qty 2

## 2017-12-24 MED ORDER — LABETALOL HCL 5 MG/ML IV SOLN
10.0000 mg | INTRAVENOUS | Status: DC | PRN
  Administered 2017-12-24 (×2): 10 mg via INTRAVENOUS
  Filled 2017-12-24 (×2): qty 4

## 2017-12-24 MED ORDER — INSULIN ASPART 100 UNIT/ML ~~LOC~~ SOLN
0.0000 [IU] | Freq: Every day | SUBCUTANEOUS | Status: DC
  Administered 2017-12-24: 0 [IU] via SUBCUTANEOUS

## 2017-12-24 MED ORDER — AMLODIPINE BESYLATE 10 MG PO TABS
10.0000 mg | ORAL_TABLET | Freq: Every day | ORAL | Status: DC
  Administered 2017-12-24: 10 mg via ORAL
  Filled 2017-12-24: qty 1

## 2017-12-24 MED ORDER — LOSARTAN POTASSIUM 50 MG PO TABS
50.0000 mg | ORAL_TABLET | Freq: Every day | ORAL | Status: DC
  Administered 2017-12-24: 50 mg via ORAL
  Filled 2017-12-24: qty 1

## 2017-12-24 NOTE — Consult Note (Signed)
Pharmacy Antibiotic Note  Anita Contreras is a 62 y.o. female admitted on 12/23/2017 with HCAP, possible aspiration PNA.  Pharmacy now consulted for Unasyn dosing. Patient received ceftazidime 1g IV in ED.   Patient has ESRD and receives HD T, Th, Sat.   Plan: Start Unasyn 1.5g IV every 12 hours.    Height: 4\' 8"  (142.2 cm) Weight: 110 lb (49.9 kg) IBW/kg (Calculated) : 36.3  Temp (24hrs), Avg:98.7 F (37.1 C), Min:97.5 F (36.4 C), Max:99.5 F (37.5 C)  Recent Labs  Lab 12/23/17 1406 12/24/17 0434  WBC 9.8 7.6  CREATININE 2.63* 3.89*    Estimated Creatinine Clearance: 10 mL/min (A) (by C-G formula based on SCr of 3.89 mg/dL (H)).    No Known Allergies  Antimicrobials this admission: 1/15 ceftazidime x 1 dose 1/16 Unasyn >>  Dose adjustments this admission:  Microbiology results: 1/15  BCx: pending  1/16 MRSA PCR: negative   Thank you for allowing pharmacy to be a part of this patient's care.  Pernell Dupre, PharmD, BCPS Clinical Pharmacist 12/24/2017 1:39 PM

## 2017-12-24 NOTE — Care Management (Addendum)
Case discussed with Estill Bamberg with [employer]. Patient is undocumented and has emergency Medicaid only for dialysis. At [facility] [place], TTS. She goes to [facility]. Spoke with [facility] and patient last visit was May 2018. Presley Raddle, NP at [facility] list as PCP.

## 2017-12-24 NOTE — Progress Notes (Signed)
[place] at [place] NAME: Anita Contreras    MR#:  [id number]  DATE OF BIRTH:  December 03, 1956  SUBJECTIVE:  CHIEF COMPLAINT:   Chief Complaint  Patient presents with  . Abdominal Pain   - Communicated using a Spanish interpreter. Came in with nausea, vomiting and fevers and chills. Much improved this morning. -CT abdomen with right lower lobe consolidation. -Due for dialysis tomorrow  REVIEW OF SYSTEMS:  Review of Systems  Constitutional: Positive for malaise/fatigue. Negative for chills and fever.  HENT: Negative for congestion, ear discharge, hearing loss and nosebleeds.   Eyes: Negative for blurred vision and double vision.  Respiratory: Negative for cough, shortness of breath and wheezing.   Cardiovascular: Negative for chest pain, palpitations and leg swelling.  Gastrointestinal: Negative for abdominal pain, constipation, diarrhea, nausea and vomiting.  Genitourinary: Negative for dysuria and urgency.  Musculoskeletal: Positive for myalgias.  Neurological: Negative for dizziness, speech change, focal weakness, seizures and headaches.  Psychiatric/Behavioral: Negative for depression.    DRUG ALLERGIES:  No Known Allergies  VITALS:  Blood pressure (!) 158/74, pulse 82, temperature 99.5 F (37.5 C), temperature source Oral, resp. rate 20, height 4\' 8"  (1.422 m), weight 49.9 kg (110 lb), last menstrual period 09/19/2001, SpO2 100 %.  PHYSICAL EXAMINATION:  Physical Exam  GENERAL:  61 y.o.-year-old patient lying in the bed with no acute distress.  EYES: Pupils equal, round, reactive to light and accommodation. No scleral icterus. Extraocular muscles intact.  HEENT: Head atraumatic, normocephalic. Oropharynx and nasopharynx clear.  NECK:  Supple, no jugular venous distention. No thyroid enlargement, no tenderness.  LUNGS: Normal breath sounds bilaterally, no wheezing, rales  or crepitation. No use of accessory muscles of  respiration. Significant right basilar rhonchi heard CARDIOVASCULAR: S1, S2 normal. No rubs, or gallops. 3/6 systolic murmur is present ABDOMEN: Soft, nontender, nondistended. Bowel sounds present. No organomegaly or mass.  EXTREMITIES: No pedal edema, cyanosis, or clubbing. Right upper extremity AV fistula with good thrill NEUROLOGIC: Cranial nerves II through XII are intact. Muscle strength 5/5 in all extremities. Sensation intact. Gait not checked. Global weakness noted. PSYCHIATRIC: The patient is alert and oriented x 3.  SKIN: No obvious rash, lesion, or ulcer.    LABORATORY PANEL:   CBC Recent Labs  Lab 12/24/17 0434  WBC 7.6  HGB 9.7*  HCT 28.7*  PLT 167   ------------------------------------------------------------------------------------------------------------------  Chemistries  Recent Labs  Lab 12/23/17 1406 12/24/17 0434  NA 134* 132*  K 4.1 3.7  CL 97* 96*  CO2 27 24  GLUCOSE 249* 156*  BUN 14 22*  CREATININE 2.63* 3.89*  CALCIUM 8.7* 7.9*  AST 24  --   ALT 15  --   ALKPHOS 99  --   BILITOT 0.8  --    ------------------------------------------------------------------------------------------------------------------  Cardiac Enzymes No results for input(s): TROPONINI in the last 168 hours. ------------------------------------------------------------------------------------------------------------------  RADIOLOGY:  Ct Abdomen Pelvis Wo Contrast  Result Date: 12/23/2017 CLINICAL DATA:  Nausea and vomiting EXAM: CT ABDOMEN AND PELVIS WITHOUT CONTRAST TECHNIQUE: Multidetector CT imaging of the abdomen and pelvis was performed following the standard protocol without IV contrast. COMPARISON:  CT abdomen pelvis 04/15/2016 FINDINGS: Lower chest: Partially visualized consolidation and adjacent ground-glass density in the right lower lobe with 2 additional foci of nodular density, also in the right lower lobe. No significant pleural effusion. Heart size borderline  enlarged. Hepatobiliary: No focal liver abnormality is seen. No gallstones, gallbladder wall thickening, or biliary dilatation. Pancreas: Unremarkable.  No pancreatic ductal dilatation or surrounding inflammatory changes. Spleen: Normal in size without focal abnormality. Adrenals/Urinary Tract: Adrenal glands are unremarkable. Kidneys are normal, without renal calculi, focal lesion, or hydronephrosis. Bladder is unremarkable. Stomach/Bowel: Stomach is within normal limits. Appendix appears normal. No evidence of bowel wall thickening, distention, or inflammatory changes. Vascular/Lymphatic: Moderate severe aortic atherosclerosis. No aneurysmal dilatation. No significantly enlarged lymph nodes. Reproductive: Uterus and bilateral adnexa are unremarkable. Other: Negative for free air or free fluid. Fat in the umbilical region Musculoskeletal: No acute or suspicious bony finding IMPRESSION: 1. Negative for bowel obstruction, appendicitis, or bowel wall thickening 2. Partially visualized consolidative process in the right lower lobe with adjacent ground-glass density and additional foci of nodular airspace disease, findings are suspicious for a pneumonia. Recommend 6-12 week CT follow-up following adequate antibiotic trial to ensure resolution and exclude underlying mass. Electronically Signed   By: Donavan Foil M.D.   On: 12/23/2017 17:44   Dg Chest 2 View  Result Date: 12/23/2017 CLINICAL DATA:  abd pain and vomiting since Sunday. Says she vomits every thing. No diarrhea. Says she feels dizzy. Never a smoker. Hx CKD, DM, CAD EXAM: CHEST  2 VIEW COMPARISON:  01/15/2017 FINDINGS: The cardiac silhouette is mildly enlarged. No mediastinal or hilar masses. No convincing adenopathy. There are prominent bilateral bronchovascular markings similar to prior studies. No overt pulmonary edema. No evidence of pneumonia. No pleural effusion or pneumothorax. Skeletal structures are intact. IMPRESSION: No acute cardiopulmonary  disease. Electronically Signed   By: Lajean Manes M.D.   On: 12/23/2017 18:28   Ct Head Wo Contrast  Result Date: 12/23/2017 CLINICAL DATA:  Dizziness with nausea and vomiting EXAM: CT HEAD WITHOUT CONTRAST TECHNIQUE: Contiguous axial images were obtained from the base of the skull through the vertex without intravenous contrast. COMPARISON:  01/14/2017 head CT FINDINGS: Brain: No evidence of acute infarction, hemorrhage, hydrocephalus, extra-axial collection or mass lesion/mass effect. Vascular: No hyperdense vessel or unexpected calcification. Skull: Normal. Negative for fracture or focal lesion. Sinuses/Orbits: Mild mucosal thickening in the ethmoid sinuses. No acute orbital abnormality Other: None IMPRESSION: No CT evidence for acute intracranial abnormality. Negative non contrasted CT examination of the brain. Electronically Signed   By: Donavan Foil M.D.   On: 12/23/2017 17:47    EKG:   Orders placed or performed during the hospital encounter of 12/23/17  . EKG 12-Lead  . EKG 12-Lead    ASSESSMENT AND PLAN:   62 year old female with end-stage renal disease on Tuesday, Thursday and Saturday hemodialysis, hypertension, hyperlipidemia, anemia, osteoporosis presents to hospital secondary to fevers and chills and noted to have right basilar pneumonia.  1. Right lower lobe pneumonia-MRSA PCR is negative, discontinue vancomycin. -Continue cefepime, follow blood cultures. -Off oxygen at this point.  2. Generalized weakness-secondary to pneumonia. Physical therapy consult. Independent at baseline.  3. End-stage renal disease on hemodialysis-consult nephrology. Had office yesterday. On Tuesday, Thursday and Saturday schedule. Due for dialysis tomorrow. Continue renal supplements  4. Hypertension-continue losartan.  5. DVT prophylaxis-subcutaneous heparin   All the records are reviewed and case discussed with Care Management/Social Workerr. Management plans discussed with the patient,  family and they are in agreement.  CODE STATUS: Full code  TOTAL TIME TAKING CARE OF THIS PATIENT: 36 minutes.   POSSIBLE D/C IN 1-2 DAYS, DEPENDING ON CLINICAL CONDITION.   Gladstone Lighter M.D on 12/24/2017 at 10:34 AM  Between 7am to 6pm - Pager - [phone]  After 6pm go to [email or web address] - [email or web address]  [place] [facility]  Office  [phone]  CC: Primary care physician; Arlis Porta, FNP

## 2017-12-24 NOTE — Progress Notes (Signed)
[facility], [place] 12/24/17  Subjective:   Anita Contreras is through a Spanish interpreter Patient is known to our practice from previous admissions.  She goes to [place] dialysis unit for chronic dialysis.  She reports that yesterday during dialysis she had cold chills therefore she was referred to the emergency room for evaluation.  In the ER, she was diagnosed with a pneumonia and was hypoxic therefore admitted for further evaluation and management.  Objective:  Vital signs in last 24 hours:  Temp:  [97.5 F (36.4 C)-99.5 F (37.5 C)] 99.5 F (37.5 C) (01/16 0456) Pulse Rate:  [82-92] 82 (01/16 0535) Resp:  [12-20] 20 (01/16 0456) BP: (155-212)/(56-80) 158/74 (01/16 0655) SpO2:  [89 %-100 %] 100 % (01/16 0535) Weight:  [45.4 kg (100 lb)-49.9 kg (110 lb)] 49.9 kg (110 lb) (01/16 0500)  Weight change:  Filed Weights   12/23/17 1333 12/24/17 0500  Weight: 45.4 kg (100 lb) 49.9 kg (110 lb)    Intake/Output:    Intake/Output Summary (Last 24 hours) at 12/24/2017 1310 Last data filed at 12/24/2017 1039 Gross per 24 hour  Intake 410 ml  Output -  Net 410 ml     Physical Exam: General:  No acute distress, ambulatory with physical therapy  HEENT  anicteric, moist oral mucous membranes  Neck  supple  Pulm/lungs  bilateral basilar crackles  CVS/Heart  regular rhythm, soft systolic murmur  Abdomen:   Soft, nontender  Extremities:  No edema  Neurologic:  Alert, oriented  Skin:  No acute rashes  Access:  Right upper arm AV fistula, good thrill       Basic Metabolic Panel:  Recent Labs  Lab 12/23/17 1406 12/24/17 0434  NA 134* 132*  K 4.1 3.7  CL 97* 96*  CO2 27 24  GLUCOSE 249* 156*  BUN 14 22*  CREATININE 2.63* 3.89*  CALCIUM 8.7* 7.9*     CBC: Recent Labs  Lab 12/23/17 1406 12/24/17 0434  WBC 9.8 7.6  HGB 10.6* 9.7*  HCT 31.0* 28.7*  MCV 94.8 95.0  PLT 153 167      Lab Results  Component Value Date   HEPBSAG  Negative 01/14/2017   HEPBSAB Non Reactive 01/14/2017   HEPBIGM Negative 04/16/2016      Microbiology:  Recent Results (from the past 240 hour(s))  Blood Culture (routine x 2)     Status: None (Preliminary result)   Collection Time: 12/23/17  8:31 PM  Result Value Ref Range Status   Specimen Description BLOOD RFOA  Final   Special Requests   Final    BOTTLES DRAWN AEROBIC AND ANAEROBIC Blood Culture adequate volume   Culture   Final    NO GROWTH < 12 HOURS Performed at [facility], [address] [place], [place] [address]    Report Status PENDING  Incomplete  Blood Culture (routine x 2)     Status: None (Preliminary result)   Collection Time: 12/23/17  8:32 PM  Result Value Ref Range Status   Specimen Description BLOOD RIGHT HAND  Final   Special Requests   Final    BOTTLES DRAWN AEROBIC AND ANAEROBIC Blood Culture adequate volume   Culture   Final    NO GROWTH < 12 HOURS Performed at [facility], [address] [place], [place] [address]    Report Status PENDING  Incomplete  MRSA PCR Screening     Status: None   Collection Time: 12/24/17  4:54 AM  Result  Value Ref Range Status   MRSA by PCR NEGATIVE NEGATIVE Final    Comment:        The GeneXpert MRSA Assay (FDA approved for NASAL specimens only), is one component of a comprehensive MRSA colonization surveillance program. It is not intended to diagnose MRSA infection nor to guide or monitor treatment for MRSA infections. Performed at [facility], [address] [place], [place] [address]     Coagulation Studies: No results for input(s): LABPROT, INR in the last 72 hours.  Urinalysis: Recent Labs    12/23/17 [place] 1.016  PHURINE 7.0  GLUCOSEU 50*  HGBUR NEGATIVE  BILIRUBINUR NEGATIVE  KETONESUR NEGATIVE  PROTEINUR >=300*  NITRITE NEGATIVE  LEUKOCYTESUR LARGE*      Imaging: Ct Abdomen Pelvis Wo Contrast  Result Date:  12/23/2017 CLINICAL DATA:  Nausea and vomiting EXAM: CT ABDOMEN AND PELVIS WITHOUT CONTRAST TECHNIQUE: Multidetector CT imaging of the abdomen and pelvis was performed following the standard protocol without IV contrast. COMPARISON:  CT abdomen pelvis 04/15/2016 FINDINGS: Lower chest: Partially visualized consolidation and adjacent ground-glass density in the right lower lobe with 2 additional foci of nodular density, also in the right lower lobe. No significant pleural effusion. Heart size borderline enlarged. Hepatobiliary: No focal liver abnormality is seen. No gallstones, gallbladder wall thickening, or biliary dilatation. Pancreas: Unremarkable. No pancreatic ductal dilatation or surrounding inflammatory changes. Spleen: Normal in size without focal abnormality. Adrenals/Urinary Tract: Adrenal glands are unremarkable. Kidneys are normal, without renal calculi, focal lesion, or hydronephrosis. Bladder is unremarkable. Stomach/Bowel: Stomach is within normal limits. Appendix appears normal. No evidence of bowel wall thickening, distention, or inflammatory changes. Vascular/Lymphatic: Moderate severe aortic atherosclerosis. No aneurysmal dilatation. No significantly enlarged lymph nodes. Reproductive: Uterus and bilateral adnexa are unremarkable. Other: Negative for free air or free fluid. Fat in the umbilical region Musculoskeletal: No acute or suspicious bony finding IMPRESSION: 1. Negative for bowel obstruction, appendicitis, or bowel wall thickening 2. Partially visualized consolidative process in the right lower lobe with adjacent ground-glass density and additional foci of nodular airspace disease, findings are suspicious for a pneumonia. Recommend 6-12 week CT follow-up following adequate antibiotic trial to ensure resolution and exclude underlying mass. Electronically Signed   By: Donavan Foil M.D.   On: 12/23/2017 17:44   Dg Chest 2 View  Result Date: 12/23/2017 CLINICAL DATA:  abd pain and vomiting  since Sunday. Says she vomits every thing. No diarrhea. Says she feels dizzy. Never a smoker. Hx CKD, DM, CAD EXAM: CHEST  2 VIEW COMPARISON:  01/15/2017 FINDINGS: The cardiac silhouette is mildly enlarged. No mediastinal or hilar masses. No convincing adenopathy. There are prominent bilateral bronchovascular markings similar to prior studies. No overt pulmonary edema. No evidence of pneumonia. No pleural effusion or pneumothorax. Skeletal structures are intact. IMPRESSION: No acute cardiopulmonary disease. Electronically Signed   By: Lajean Manes M.D.   On: 12/23/2017 18:28   Ct Head Wo Contrast  Result Date: 12/23/2017 CLINICAL DATA:  Dizziness with nausea and vomiting EXAM: CT HEAD WITHOUT CONTRAST TECHNIQUE: Contiguous axial images were obtained from the base of the skull through the vertex without intravenous contrast. COMPARISON:  01/14/2017 head CT FINDINGS: Brain: No evidence of acute infarction, hemorrhage, hydrocephalus, extra-axial collection or mass lesion/mass effect. Vascular: No hyperdense vessel or unexpected calcification. Skull: Normal. Negative for fracture or focal lesion. Sinuses/Orbits: Mild mucosal thickening in the ethmoid sinuses. No acute orbital abnormality Other: None IMPRESSION: No CT evidence for acute intracranial abnormality.  Negative non contrasted CT examination of the brain. Electronically Signed   By: Donavan Foil M.D.   On: 12/23/2017 17:47     Medications:    . amLODipine  10 mg Oral Daily  . aspirin EC  81 mg Oral Daily  . atorvastatin  40 mg Oral q1800  . ceFEPime (MAXIPIME) IV  500 mg Intravenous Q24H  . heparin  5,000 Units Subcutaneous Q8H  . hydrALAZINE  25 mg Oral TID  . insulin aspart  0-5 Units Subcutaneous QHS  . insulin aspart  0-9 Units Subcutaneous TID WC  . losartan  50 mg Oral Daily   acetaminophen **OR** acetaminophen, hydrOXYzine, labetalol, ondansetron (ZOFRAN) IV, oxyCODONE-acetaminophen, traMADol  Assessment/ Plan:  62 y.o. female  with end-stage renal disease, diabetes with retinopathy, neuropathy admitted for evaluation and management of pneumonia  [facility] nephrology/TTS  1.  End-stage renal disease 2.  Anemia of chronic kidney disease 3.  Secondary hyperparathyroidism 4.  Pneumonia and hypoxia  Patient had her routine dialysis yesterday.  We will plan on her dialysis treatment tomorrow Electrolytes and volume status are acceptable No acute indication for dialysis today Hemoglobin is 9.7, we will initiate Epogen with hemodialysis Patient's home binders are listed as calcium acetate.  Can be resumed with meals. Monitor Phos     LOS: [place] 1/16/20191:10 PM  [facility] [place], [place]

## 2017-12-24 NOTE — Consult Note (Signed)
Pharmacy Antibiotic Note  Anita Contreras is a 62 y.o. female admitted on 12/23/2017 with HCAP.  Pharmacy has been consulted for Cefepime dosing. Patient received ceftazidime 1g IV in ED.  Patient has ESRD and receives HD T, Th, Sat.   Plan: Start cefepime 500mg  IV every 24 hours. Should be given after Hemodialysis on HD days.   Height: 4\' 8"  (142.2 cm) Weight: 110 lb (49.9 kg) IBW/kg (Calculated) : 36.3  Temp (24hrs), Avg:98.9 F (37.2 C), Min:97.5 F (36.4 C), Max:99.5 F (37.5 C)  Recent Labs  Lab 12/23/17 1406 12/24/17 0434  WBC 9.8 7.6  CREATININE 2.63* 3.89*    Estimated Creatinine Clearance: 10 mL/min (A) (by C-G formula based on SCr of 3.89 mg/dL (H)).    No Known Allergies  Antimicrobials this admission: 1/15 ceftazidime x 1 dose 1/16 cefepime >>  Dose adjustments this admission:  Microbiology results: 1/15  BCx: pending  1/16 MRSA PCR: negative   Thank you for allowing pharmacy to be a part of this patient's care.  Pernell Dupre, PharmD, BCPS Clinical Pharmacist 12/24/2017 9:43 AM

## 2017-12-24 NOTE — Evaluation (Signed)
Physical Therapy Evaluation Patient Details Name: Anita Contreras MRN: [id number] DOB: 1956/04/30 Today's Date: 12/24/2017   History of Present Illness  Pt is a49 y.o.femalewho presents with two days of nausea vomiting, fever with chills, cough. In the ED the patient was found on CT scan to have basilar pneumonia. She is a dialysis patient. Hospitalist were called for admission and treatment.  Assessment includes: R lower lobe PNA, generalized weakness, ESRD on HD T, Th, and Sat, and HTN.    Clinical Impression  Pt presents with mild deficits in strength, transfers, and mobility, and with moderate deficits in gait, balance, and activity tolerance.  Pt Ind with bed mobility tasks with good effort and SBA with transfers.  Pt presented with min instability and staggering during amb without UE support requiring close CGA.  Pt's baseline SpO2 on room air 94% and HR 79 bpm.  After sitting at EOB several minutes with low intensity LE therx pt's SpO2 dropped to 91% with HR remaining in the upper 70s.  After amb 80' pt's SpO2 dropped to 88-89% with HR in the upper 70s to low 80s.  Pt's SpO2 quickly returned to the low 90s upon sitting.  Pt educated on importance on being out of bed and up in chair as tolerated with her diagnosis of pneumonia.  Pt will benefit from HHPT services upon discharge to safely address above deficits for decreased caregiver assistance and eventual return to PLOF.        Follow Up Recommendations Home health PT; pt should use her RW with mobility at home with supervision secondary to elevated fall risk.    Equipment Recommendations  None recommended by PT    Recommendations for Other Services       Precautions / Restrictions Precautions Precautions: Fall Restrictions Weight Bearing Restrictions: No      Mobility  Bed Mobility Overal bed mobility: Independent                Transfers Overall transfer level: Needs assistance Equipment used:  None Transfers: Sit to/from Stand Sit to Stand: Supervision         General transfer comment: Good eccentric and concentric control during transfers  Ambulation/Gait Ambulation/Gait assistance: Min guard Ambulation Distance (Feet): 80 Feet Assistive device: None Gait Pattern/deviations: Staggering left;Staggering right;Step-through pattern;Decreased step length - right;Decreased step length - left   Gait velocity interpretation: <1.8 ft/sec, indicative of risk for recurrent falls General Gait Details: Min instability during amb without AD with occasional staggering both L and R but pt able to self-correct; SpO2 dropped to a low of 88-89% with amb on room air but returned to low 90's quickly upon sitting.    Stairs Stairs: (Deferred)          Wheelchair Mobility    Modified Rankin (Stroke Patients Only)       Balance Overall balance assessment: Needs assistance Sitting-balance support: Feet supported;Feet unsupported;No upper extremity supported Sitting balance-Leahy Scale: Good     Standing balance support: During functional activity;No upper extremity supported Standing balance-Leahy Scale: Poor Standing balance comment: Min instability and staggering during functional activity in standing without UE support requiring close CGA                             Pertinent Vitals/Pain Pain Assessment: No/denies pain    Home Living Family/patient expects to be discharged to:: Private residence Living Arrangements: Alone Available Help at Discharge: Family;Available PRN/intermittently Type of Home: Mobile home  Home Access: Stairs to enter Entrance Stairs-Rails: Right;Left(Too wide for both) Entrance Stairs-Number of Steps: 3 Home Layout: One level Home Equipment: Walker - 2 wheels      Prior Function Level of Independence: Needs assistance   Gait / Transfers Assistance Needed: Pt reports amb limited to [facility] distances without AD with no fall history, uses  the w/c at the doctor's office during MD visits, daughter provides HHA with stairs  ADL's / Homemaking Assistance Needed: Pt Ind with bathing and dressing with daugther driving pt and assisting with errands        Hand Dominance   Dominant Hand: Right(Uses L to eat)    Extremity/Trunk Assessment   Upper Extremity Assessment Upper Extremity Assessment: Overall WFL for tasks assessed    Lower Extremity Assessment Lower Extremity Assessment: Generalized weakness       Communication   Communication: Interpreter utilized;Prefers language other than English(Spanish interpreter Ronnald Collum used during session, [phone])  Cognition Arousal/Alertness: Awake/alert Behavior During Therapy: WFL for tasks assessed/performed Overall Cognitive Status: Within Functional Limits for tasks assessed                                        General Comments      Exercises Total Joint Exercises Long Arc Quad: AROM;Both;10 reps Knee Flexion: AROM;Both;10 reps   Assessment/Plan    PT Assessment Patient needs continued PT services  PT Problem List Decreased strength;Decreased balance;Decreased activity tolerance       PT Treatment Interventions DME instruction;Gait training;Stair training;Functional mobility training;Neuromuscular re-education;Balance training;Therapeutic exercise;Therapeutic activities;Patient/family education    PT Goals (Current goals can be found in the Care Plan section)  Acute Rehab PT Goals Patient Stated Goal: Pt reports no functional goals per interpreter PT Goal Formulation: Patient unable to participate in goal setting Time For Goal Achievement: 01/06/18 Potential to Achieve Goals: Good    Frequency Min 2X/week   Barriers to discharge        Co-evaluation               AM-PAC PT "6 Clicks" Daily Activity  Outcome Measure Difficulty turning over in bed (including adjusting bedclothes, sheets and blankets)?: None Difficulty moving from lying  on back to sitting on the side of the bed? : None Difficulty sitting down on and standing up from a chair with arms (e.g., wheelchair, bedside commode, etc,.)?: None Help needed moving to and from a bed to chair (including a wheelchair)?: A Little Help needed walking in hospital room?: A Little Help needed climbing 3-5 steps with a railing? : A Little 6 Click Score: 21    End of Session Equipment Utilized During Treatment: Gait belt Activity Tolerance: Patient tolerated treatment well Patient left: in chair;with call bell/phone within reach;with chair alarm set Nurse Communication: Mobility status PT Visit Diagnosis: Difficulty in walking, not elsewhere classified (R26.2);Muscle weakness (generalized) (M62.81)    Time: 6644-0347 PT Time Calculation (min) (ACUTE ONLY): 24 min   Charges:   PT Evaluation $PT Eval Low Complexity: 1 Low     PT G Codes:        DRoyetta Asal PT, DPT 12/24/17, 2:18 PM

## 2017-12-24 NOTE — Progress Notes (Signed)
Patient alert and oriented, vss.  No pain on dayshift.  Ambulates to bathroom with standby assistance, slightly unsteady on her feet.  No acute events during shift.  Continue to monitor.

## 2017-12-25 LAB — BASIC METABOLIC PANEL
ANION GAP: 11 (ref 5–15)
BUN: 34 mg/dL — ABNORMAL HIGH (ref 6–20)
CALCIUM: 7.6 mg/dL — AB (ref 8.9–10.3)
CO2: 25 mmol/L (ref 22–32)
Chloride: 93 mmol/L — ABNORMAL LOW (ref 101–111)
Creatinine, Ser: 5.55 mg/dL — ABNORMAL HIGH (ref 0.44–1.00)
GFR calc Af Amer: 9 mL/min — ABNORMAL LOW (ref >60)
GFR calc non Af Amer: 7 mL/min — ABNORMAL LOW (ref >60)
GLUCOSE: 148 mg/dL — AB (ref 65–99)
Potassium: 4.1 mmol/L (ref 3.5–5.1)
Sodium: 129 mmol/L — ABNORMAL LOW (ref 135–145)

## 2017-12-25 LAB — GLUCOSE, CAPILLARY
GLUCOSE-CAPILLARY: 224 mg/dL — AB (ref 65–99)
Glucose-Capillary: 148 mg/dL — ABNORMAL HIGH (ref 65–99)

## 2017-12-25 LAB — HIV ANTIBODY (ROUTINE TESTING W REFLEX): HIV SCREEN 4TH GENERATION: NONREACTIVE

## 2017-12-25 MED ORDER — AMOXICILLIN-POT CLAVULANATE 875-125 MG PO TABS: 1.0000 | ORAL_TABLET | Freq: Every day | ORAL | 0 refills | Status: AC

## 2017-12-25 NOTE — Progress Notes (Signed)
HD start 

## 2017-12-25 NOTE — Progress Notes (Signed)
[facility], [place] 12/25/17  Subjective:   Patient tolerated her dialysis well today 2 L of fluid was removed No acute complaints  Objective:  Vital signs in last 24 hours:  Temp:  [98.2 F (36.8 C)-99.6 F (37.6 C)] 98.8 F (37.1 C) (01/17 1344) Pulse Rate:  [73-86] 86 (01/17 1344) Resp:  [14-22] 18 (01/17 1344) BP: (119-163)/(49-89) 140/61 (01/17 1344) SpO2:  [91 %-100 %] 98 % (01/17 1344) Weight:  [52.2 kg (115 lb 1.3 oz)] 52.2 kg (115 lb 1.3 oz) (01/17 0908)  Weight change:  Filed Weights   12/23/17 1333 12/24/17 0500 12/25/17 0908  Weight: 45.4 kg (100 lb) 49.9 kg (110 lb) 52.2 kg (115 lb 1.3 oz)    Intake/Output:    Intake/Output Summary (Last 24 hours) at 12/25/2017 1438 Last data filed at 12/25/2017 1245 Gross per 24 hour  Intake 50 ml  Output 2001 ml  Net -1951 ml     Physical Exam: General:  No acute distress, ambulatory with physical therapy  HEENT  anicteric, moist oral mucous membranes  Neck  supple  Pulm/lungs  right greater than left basilar crackles, room air  CVS/Heart  regular rhythm, soft systolic murmur  Abdomen:   Soft, nontender  Extremities:  No edema  Neurologic:  Alert, oriented  Skin:  No acute rashes  Access:  Right upper arm AV fistula, good thrill       Basic Metabolic Panel:  Recent Labs  Lab 12/23/17 1406 12/24/17 0434 12/25/17 0617  NA 134* 132* 129*  K 4.1 3.7 4.1  CL 97* 96* 93*  CO2 27 24 25   GLUCOSE 249* 156* 148*  BUN 14 22* 34*  CREATININE 2.63* 3.89* 5.55*  CALCIUM 8.7* 7.9* 7.6*     CBC: Recent Labs  Lab 12/23/17 1406 12/24/17 0434  WBC 9.8 7.6  HGB 10.6* 9.7*  HCT 31.0* 28.7*  MCV 94.8 95.0  PLT 153 167      Lab Results  Component Value Date   HEPBSAG Negative 01/14/2017   HEPBSAB Non Reactive 01/14/2017   HEPBIGM Negative 04/16/2016      Microbiology:  Recent Results (from the past 240 hour(s))  Blood Culture (routine x 2)     Status: None (Preliminary  result)   Collection Time: 12/23/17  8:31 PM  Result Value Ref Range Status   Specimen Description BLOOD RFOA  Final   Special Requests   Final    BOTTLES DRAWN AEROBIC AND ANAEROBIC Blood Culture adequate volume   Culture   Final    NO GROWTH 2 DAYS Performed at [facility], [address] [place], [place] [address]    Report Status PENDING  Incomplete  Blood Culture (routine x 2)     Status: None (Preliminary result)   Collection Time: 12/23/17  8:32 PM  Result Value Ref Range Status   Specimen Description BLOOD RIGHT HAND  Final   Special Requests   Final    BOTTLES DRAWN AEROBIC AND ANAEROBIC Blood Culture adequate volume   Culture   Final    NO GROWTH 2 DAYS Performed at [facility], [address] [place], [place] [address]    Report Status PENDING  Incomplete  MRSA PCR Screening     Status: None   Collection Time: 12/24/17  4:54 AM  Result Value Ref Range Status   MRSA by PCR NEGATIVE NEGATIVE Final    Comment:        The GeneXpert MRSA Assay (FDA approved  for NASAL specimens only), is one component of a comprehensive MRSA colonization surveillance program. It is not intended to diagnose MRSA infection nor to guide or monitor treatment for MRSA infections. Performed at [facility], [address] [place], [place] [address]     Coagulation Studies: No results for input(s): LABPROT, INR in the last 72 hours.  Urinalysis: Recent Labs    12/23/17 [place] 1.016  PHURINE 7.0  GLUCOSEU 50*  HGBUR NEGATIVE  BILIRUBINUR NEGATIVE  KETONESUR NEGATIVE  PROTEINUR >=300*  NITRITE NEGATIVE  LEUKOCYTESUR LARGE*      Imaging: Ct Abdomen Pelvis Wo Contrast  Result Date: 12/23/2017 CLINICAL DATA:  Nausea and vomiting EXAM: CT ABDOMEN AND PELVIS WITHOUT CONTRAST TECHNIQUE: Multidetector CT imaging of the abdomen and pelvis was performed following the standard protocol without IV contrast. COMPARISON:  CT  abdomen pelvis 04/15/2016 FINDINGS: Lower chest: Partially visualized consolidation and adjacent ground-glass density in the right lower lobe with 2 additional foci of nodular density, also in the right lower lobe. No significant pleural effusion. Heart size borderline enlarged. Hepatobiliary: No focal liver abnormality is seen. No gallstones, gallbladder wall thickening, or biliary dilatation. Pancreas: Unremarkable. No pancreatic ductal dilatation or surrounding inflammatory changes. Spleen: Normal in size without focal abnormality. Adrenals/Urinary Tract: Adrenal glands are unremarkable. Kidneys are normal, without renal calculi, focal lesion, or hydronephrosis. Bladder is unremarkable. Stomach/Bowel: Stomach is within normal limits. Appendix appears normal. No evidence of bowel wall thickening, distention, or inflammatory changes. Vascular/Lymphatic: Moderate severe aortic atherosclerosis. No aneurysmal dilatation. No significantly enlarged lymph nodes. Reproductive: Uterus and bilateral adnexa are unremarkable. Other: Negative for free air or free fluid. Fat in the umbilical region Musculoskeletal: No acute or suspicious bony finding IMPRESSION: 1. Negative for bowel obstruction, appendicitis, or bowel wall thickening 2. Partially visualized consolidative process in the right lower lobe with adjacent ground-glass density and additional foci of nodular airspace disease, findings are suspicious for a pneumonia. Recommend 6-12 week CT follow-up following adequate antibiotic trial to ensure resolution and exclude underlying mass. Electronically Signed   By: Donavan Foil M.D.   On: 12/23/2017 17:44   Dg Chest 2 View  Result Date: 12/23/2017 CLINICAL DATA:  abd pain and vomiting since Sunday. Says she vomits every thing. No diarrhea. Says she feels dizzy. Never a smoker. Hx CKD, DM, CAD EXAM: CHEST  2 VIEW COMPARISON:  01/15/2017 FINDINGS: The cardiac silhouette is mildly enlarged. No mediastinal or hilar  masses. No convincing adenopathy. There are prominent bilateral bronchovascular markings similar to prior studies. No overt pulmonary edema. No evidence of pneumonia. No pleural effusion or pneumothorax. Skeletal structures are intact. IMPRESSION: No acute cardiopulmonary disease. Electronically Signed   By: Lajean Manes M.D.   On: 12/23/2017 18:28   Ct Head Wo Contrast  Result Date: 12/23/2017 CLINICAL DATA:  Dizziness with nausea and vomiting EXAM: CT HEAD WITHOUT CONTRAST TECHNIQUE: Contiguous axial images were obtained from the base of the skull through the vertex without intravenous contrast. COMPARISON:  01/14/2017 head CT FINDINGS: Brain: No evidence of acute infarction, hemorrhage, hydrocephalus, extra-axial collection or mass lesion/mass effect. Vascular: No hyperdense vessel or unexpected calcification. Skull: Normal. Negative for fracture or focal lesion. Sinuses/Orbits: Mild mucosal thickening in the ethmoid sinuses. No acute orbital abnormality Other: None IMPRESSION: No CT evidence for acute intracranial abnormality. Negative non contrasted CT examination of the brain. Electronically Signed   By: Donavan Foil M.D.   On: 12/23/2017 17:47     Medications:   .  ampicillin-sulbactam (UNASYN) IV 1.5 g (12/25/17 0518)   . amLODipine  10 mg Oral Daily  . aspirin EC  81 mg Oral Daily  . atorvastatin  40 mg Oral q1800  . heparin  5,000 Units Subcutaneous Q8H  . hydrALAZINE  25 mg Oral TID  . insulin aspart  0-5 Units Subcutaneous QHS  . insulin aspart  0-9 Units Subcutaneous TID WC  . losartan  50 mg Oral Daily   acetaminophen **OR** acetaminophen, hydrOXYzine, labetalol, ondansetron (ZOFRAN) IV, oxyCODONE-acetaminophen, traMADol  Assessment/ Plan:  62 y.o. female with end-stage renal disease, diabetes with retinopathy, neuropathy admitted for evaluation and management of pneumonia  [facility] nephrology/TTS  1.  End-stage renal disease 2.  Anemia of chronic kidney disease 3.   Secondary hyperparathyroidism 4.  Pneumonia and hypoxia  Patient is expected to discharge today.  She will resume her usual schedule of hemodialysis as an outpatient      LOS: 2 Anita Contreras Anita Contreras 1/17/20192:38 PM  [facility] [place], [place]

## 2017-12-25 NOTE — Progress Notes (Signed)
Inpatient Diabetes Program Recommendations  AACE/ADA: New Consensus Statement on Inpatient Glycemic Control (2015)  Target Ranges:  Prepandial:   less than 140 mg/dL      Peak postprandial:   less than 180 mg/dL (1-2 hours)      Critically ill patients:  140 - 180 mg/dL   Results for Anita Contreras, Anita Contreras (MRN [id number]) as of 12/25/2017 10:35  Ref. Range 12/24/2017 08:52 12/24/2017 12:21 12/24/2017 17:21 12/24/2017 21:06 12/25/2017 07:35  Glucose-Capillary Latest Ref Range: 65 - 99 mg/dL 132 (H) 228 (H) 239 (H) 162 (H) 148 (H)   Review of Glycemic Control  Diabetes history: DM Outpatient Diabetes medications: 70/30 6 units BID Current orders for Inpatient glycemic control: Novolog 0-9 units TID with meals, Novolog 0-5 units QHS  Inpatient Diabetes Program Recommendations: Insulin - Meal Coverage: Please consider ordering Novolog 2 units TID with meals for meal coverage if patient eats at least 50% of meals.  Thanks, Barnie Alderman, RN, MSN, CDE Diabetes Coordinator Inpatient Diabetes Program [phone] (Team Pager from 8am to 5pm)

## 2017-12-25 NOTE — Care Management (Signed)
Patient set up with [facility] for home health PT under the charity program.

## 2017-12-25 NOTE — Progress Notes (Signed)
DISCHARGE INSTRUCTIONS:  Spanish translator at the bedside, discharge instructions and prescriptions given to pt. Pt verbalized understanding. Pts granddaughter coming to pick pt up.

## 2017-12-25 NOTE — Discharge Summary (Addendum)
[place] [facility] [place] NAME: Anita Contreras    MR#:  [id number]  DATE OF BIRTH:  1956-08-31  DATE OF ADMISSION:  12/23/2017   ADMITTING PHYSICIAN: Lance Coon, MD  DATE OF DISCHARGE: 12/25/2017  PRIMARY CARE PHYSICIAN: Arlis Porta, FNP   ADMISSION DIAGNOSIS:   Lobar pneumonia ([place]) [J18.1] ESRD (end stage renal disease) ([place]) [N18.6] Community acquired pneumonia of right lower lobe of lung ([place]) [J18.1]  DISCHARGE DIAGNOSIS:   Principal Problem:   HCAP (healthcare-associated pneumonia) Active Problems:   ESRD on dialysis [facility])   Essential hypertension   Controlled diabetes mellitus type 2 with complications ([place])   SECONDARY DIAGNOSIS:   Past Medical History:  Diagnosis Date  . Acute cystitis with hematuria   . Anemia   . Cataracts, bilateral   . CKD (chronic kidney disease)   . Diabetic retinopathy, background ([place])   . Dialysis patient ([place])    Tues, Thurs, Sat  . Dizziness   . Hyperlipidemia   . Hypertension   . Migraine variant   . Neuropathy   . OP (osteoporosis)   . Personal history of noncompliance with medical treatment, presenting hazards to health   . Steal syndrome as complication of dialysis access ([place])   . Stroke ([place])   . Type II diabetes mellitus with ophthalmic manifestations, uncontrolled ([place])   . Vitamin D deficiency     HOSPITAL COURSE:   62 year old female with end-stage renal disease on Tuesday, Thursday and Saturday hemodialysis, hypertension, hyperlipidemia, anemia, osteoporosis presents to hospital secondary to fevers and chills and noted to have right basilar pneumonia.  1. Right lower lobe pneumonia-MRSA PCR is negative, discontinued vancomycin. -Off oxygen at this point. -Received cefepime in the hospital. Will be discharged on Augmentin  2. Generalized weakness-secondary to pneumonia. Physical therapy consulted. Independent at baseline. -We'll set up home health  PT. Advised to use her rolling walker for mobility to decrease fall risk  3. End-stage renal disease on hemodialysis- on Tuesday, Thursday and Saturday. -Nephrology consulted. Had dialysis today per schedule  4. Hypertension- Norvasc, hydralazine and losartan. -Discontinue metoprolol as blood pressure is soft    much better today. Will be discharged home today   DISCHARGE CONDITIONS:   Guarded  CONSULTS OBTAINED:   Treatment Team:  Murlean Iba, MD  DRUG ALLERGIES:   No Known Allergies DISCHARGE MEDICATIONS:   Allergies as of 12/25/2017   No Known Allergies     Medication List    STOP taking these medications   azithromycin 250 MG tablet Commonly known as:  ZITHROMAX   metoprolol tartrate 25 MG tablet Commonly known as:  LOPRESSOR   oxyCODONE-acetaminophen 7.5-325 MG tablet Commonly known as:  PERCOCET   traMADol 50 MG tablet Commonly known as:  ULTRAM     TAKE these medications   acetaminophen 500 MG tablet Commonly known as:  TYLENOL Take 1,000 mg by mouth every 6 (six) hours as needed for mild pain or headache.   amLODipine 10 MG tablet Commonly known as:  NORVASC Take 1 tablet (10 mg total) by mouth daily.   amoxicillin-clavulanate 875-125 MG tablet Commonly known as:  AUGMENTIN Take 1 tablet by mouth at bedtime for 8 days.   aspirin 81 MG EC tablet TAKE 1 TABLET BY MOUTH EVERY DAY   atorvastatin 40 MG tablet Commonly known as:  LIPITOR Take 40 mg by mouth daily at 6 PM.   calcium acetate 667 MG capsule Commonly known as:  PHOSLO Take  1 capsule (667 mg total) by mouth 3 (three) times daily with meals.   docusate sodium 100 MG capsule Commonly known as:  COLACE Take 100 mg by mouth 2 (two) times daily.   feeding supplement (NEPRO CARB STEADY) Liqd Take 237 mLs by mouth 2 (two) times daily between meals.   furosemide 20 MG tablet Commonly known as:  LASIX Take 20 mg by mouth daily.   hydrALAZINE 25 MG tablet Commonly known as:   APRESOLINE Take 1 tablet (25 mg total) by mouth 3 (three) times daily.   hydrOXYzine 25 MG tablet Commonly known as:  ATARAX/VISTARIL Take 25 mg by mouth 2 (two) times daily as needed for itching.   insulin NPH-regular Human (70-30) 100 UNIT/ML injection Commonly known as:  NOVOLIN 70/30 Inject 6 Units into the skin 2 (two) times daily with a meal.   lidocaine-prilocaine cream Commonly known as:  EMLA Apply 1 application topically as needed.   losartan 50 MG tablet Commonly known as:  COZAAR Take 1 tablet (50 mg total) by mouth daily.   sodium bicarbonate 650 MG tablet Take 1,300 mg by mouth 2 (two) times daily.        DISCHARGE INSTRUCTIONS:   1. PCP follow-up in 1-2 weeks  DIET:   Renal diet  ACTIVITY:   Activity as tolerated  OXYGEN:   Home Oxygen: No.  Oxygen Delivery: room air  DISCHARGE LOCATION:   home   If you experience worsening of your admission symptoms, develop shortness of breath, life threatening emergency, suicidal or homicidal thoughts you must seek medical attention immediately by calling 911 or calling your MD immediately  if symptoms less severe.  You Must read complete instructions/literature along with all the possible adverse reactions/side effects for all the Medicines you take and that have been prescribed to you. Take any new Medicines after you have completely understood and accpet all the possible adverse reactions/side effects.   Please note  You were cared for by a hospitalist during your hospital stay. If you have any questions about your discharge medications or the care you received while you were in the hospital after you are discharged, you can call the unit and asked to speak with the hospitalist on call if the hospitalist that took care of you is not available. Once you are discharged, your primary care physician will handle any further medical issues. Please note that NO REFILLS for any discharge medications will be authorized  once you are discharged, as it is imperative that you return to your primary care physician (or establish a relationship with a primary care physician if you do not have one) for your aftercare needs so that they can reassess your need for medications and monitor your lab values.    On the day of Discharge:  VITAL SIGNS:   Blood pressure (!) 132/52, pulse 83, temperature 99.5 F (37.5 C), temperature source Oral, resp. rate (!) 22, height 4\' 8"  (1.422 m), weight 52.2 kg (115 lb 1.3 oz), last menstrual period 09/19/2001, SpO2 91 %.  PHYSICAL EXAMINATION:    GENERAL:  62 y.o.-year-old patient lying in the bed with no acute distress.  EYES: Pupils equal, round, reactive to light and accommodation. No scleral icterus. Extraocular muscles intact.  HEENT: Head atraumatic, normocephalic. Oropharynx and nasopharynx clear.  NECK:  Supple, no jugular venous distention. No thyroid enlargement, no tenderness.  LUNGS: Normal breath sounds bilaterally, no wheezing, rales  or crepitation. No use of accessory muscles of respiration. Significant right basilar rhonchi/crackles  heard. Minimal left basilar crackles heard. CARDIOVASCULAR: S1, S2 normal. No rubs, or gallops. 3/6 systolic murmur is present ABDOMEN: Soft, nontender, nondistended. Bowel sounds present. No organomegaly or mass.  EXTREMITIES: No pedal edema, cyanosis, or clubbing. Right upper extremity AV fistula with good thrill NEUROLOGIC: Cranial nerves II through XII are intact. Muscle strength 5/5 in all extremities. Sensation intact. Gait not checked. Global weakness noted. PSYCHIATRIC: The patient is alert and oriented x 3.  SKIN: No obvious rash, lesion, or ulcer.     DATA REVIEW:   CBC Recent Labs  Lab 12/24/17 0434  WBC 7.6  HGB 9.7*  HCT 28.7*  PLT 167    Chemistries  Recent Labs  Lab 12/23/17 1406  12/25/17 0617  NA 134*   < > 129*  K 4.1   < > 4.1  CL 97*   < > 93*  CO2 27   < > 25  GLUCOSE 249*   < > 148*  BUN 14    < > 34*  CREATININE 2.63*   < > 5.55*  CALCIUM 8.7*   < > 7.6*  AST 24  --   --   ALT 15  --   --   ALKPHOS 99  --   --   BILITOT 0.8  --   --    < > = values in this interval not displayed.     Microbiology Results  Results for orders placed or performed during the hospital encounter of 12/23/17  Blood Culture (routine x 2)     Status: None (Preliminary result)   Collection Time: 12/23/17  8:31 PM  Result Value Ref Range Status   Specimen Description BLOOD RFOA  Final   Special Requests   Final    BOTTLES DRAWN AEROBIC AND ANAEROBIC Blood Culture adequate volume   Culture   Final    NO GROWTH 2 DAYS Performed at [facility], [address] [place], [place] [address]    Report Status PENDING  Incomplete  Blood Culture (routine x 2)     Status: None (Preliminary result)   Collection Time: 12/23/17  8:32 PM  Result Value Ref Range Status   Specimen Description BLOOD RIGHT HAND  Final   Special Requests   Final    BOTTLES DRAWN AEROBIC AND ANAEROBIC Blood Culture adequate volume   Culture   Final    NO GROWTH 2 DAYS Performed at [facility], [address], [place], [place] [address]    Report Status PENDING  Incomplete  MRSA PCR Screening     Status: None   Collection Time: 12/24/17  4:54 AM  Result Value Ref Range Status   MRSA by PCR NEGATIVE NEGATIVE Final    Comment:        The GeneXpert MRSA Assay (FDA approved for NASAL specimens only), is one component of a comprehensive MRSA colonization surveillance program. It is not intended to diagnose MRSA infection nor to guide or monitor treatment for MRSA infections. Performed at [facility], [address], [place],  [address]     RADIOLOGY:  No results found.   Management plans discussed with the patient, family and they are in agreement.  CODE STATUS:     Code Status Orders  (From admission, onward)        Start     Ordered   12/24/17 0132  Full code   Continuous     12/24/17 0131    Code Status History  Date Active Date Inactive Code Status Order ID Comments User Context   01/14/2017 09:25 01/17/2017 18:03 Full Code [id number]  Laverle Hobby, MD ED   11/08/2016 18:08 11/10/2016 17:54 Full Code [id number]  Algernon Huxley, MD Inpatient   10/17/2016 13:19 10/17/2016 19:24 Full Code [id number]  Algernon Huxley, MD Inpatient   04/23/2016 20:29 05/07/2016 20:01 Full Code [id number]  Vaughan Basta, MD Inpatient   04/15/2016 19:18 04/19/2016 16:08 Full Code [id number]  Gladstone Lighter, MD Inpatient      TOTAL TIME TAKING CARE OF THIS PATIENT: 38 minutes.    Gladstone Lighter M.D on 12/25/2017 at 1:44 PM  Between 7am to 6pm - Pager - [phone]  After 6pm go to [email or web address] - Technical brewer [facility]  Office  [phone]  CC: Primary care physician; Arlis Porta, FNP   Note: This dictation was prepared with Dragon dictation along with smaller phrase technology. Any transcriptional errors that result from this process are unintentional.

## 2017-12-25 NOTE — Progress Notes (Signed)
Pre HD  

## 2017-12-25 NOTE — Care Management (Addendum)
Patient followed by Peggye Form ANP at [facility] [place]. [name] with [facility] requested to assess patient for home health charity program

## 2017-12-26 LAB — HEPATITIS B SURFACE ANTIBODY, QUANTITATIVE: HEPATITIS B-POST: 310.7 m[IU]/mL

## 2017-12-26 LAB — HEPATITIS B SURFACE ANTIGEN: Hepatitis B Surface Ag: NEGATIVE

## 2017-12-28 LAB — CULTURE, BLOOD (ROUTINE X 2)
Culture: NO GROWTH
Culture: NO GROWTH
SPECIAL REQUESTS: ADEQUATE
Special Requests: ADEQUATE

## 2018-01-14 ENCOUNTER — Encounter (INDEPENDENT_AMBULATORY_CARE_PROVIDER_SITE_OTHER): Payer: Self-pay | Admitting: Vascular Surgery

## 2018-01-14 ENCOUNTER — Ambulatory Visit (INDEPENDENT_AMBULATORY_CARE_PROVIDER_SITE_OTHER): Payer: Self-pay

## 2018-01-14 ENCOUNTER — Encounter (INDEPENDENT_AMBULATORY_CARE_PROVIDER_SITE_OTHER): Payer: Self-pay

## 2018-01-14 ENCOUNTER — Ambulatory Visit (INDEPENDENT_AMBULATORY_CARE_PROVIDER_SITE_OTHER): Payer: Self-pay | Admitting: Vascular Surgery

## 2018-01-14 ENCOUNTER — Other Ambulatory Visit (INDEPENDENT_AMBULATORY_CARE_PROVIDER_SITE_OTHER): Payer: Self-pay | Admitting: Vascular Surgery

## 2018-01-14 VITALS — BP 201/81 | HR 72 | Resp 14 | Ht <= 58 in | Wt 103.0 lb

## 2018-01-14 DIAGNOSIS — T829XXA Unspecified complication of cardiac and vascular prosthetic device, implant and graft, initial encounter: Secondary | ICD-10-CM

## 2018-01-14 DIAGNOSIS — Z992 Dependence on renal dialysis: Secondary | ICD-10-CM

## 2018-01-14 DIAGNOSIS — N186 End stage renal disease: Secondary | ICD-10-CM

## 2018-01-14 DIAGNOSIS — T829XXD Unspecified complication of cardiac and vascular prosthetic device, implant and graft, subsequent encounter: Secondary | ICD-10-CM

## 2018-01-14 NOTE — Progress Notes (Signed)
Subjective:    Patient ID: Anita Contreras, female    DOB: 11-18-1956, 62 y.o.   MRN: [id number] Chief Complaint  Patient presents with  . Follow-up    HDA check, low access flow   Patient presents at the request of her dialysis center to undergo a right upper extremity hemodialysis access duplex exam.  Patient seen and examined in the presence of an interpreter.  The patient's dialysis center is experiencing cannulation issues, low flow rates and prolonged bleeding.  The patient denies any right upper extremity pain or ulceration.  The patient denies or vomiting.  The patient underwent a right upper extremity dialysis access duplex which was notable for thrombus noted with vessel narrowing and elevated velocities at the level of the right cephalic vein confluence level.  Evidence of turbulent flow in the right subclavian vein.  Flow volume of 1082.   Review of Systems  Constitutional: Negative.   HENT: Negative.   Eyes: Negative.   Respiratory: Negative.   Cardiovascular: Negative.   Gastrointestinal: Negative.   Endocrine: Negative.   Genitourinary:       Graft complications  Musculoskeletal: Negative.   Skin: Negative.   Allergic/Immunologic: Negative.   Neurological: Negative.   Hematological: Negative.   Psychiatric/Behavioral: Negative.       Objective:   Physical Exam  Constitutional: She is oriented to person, place, and time. She appears well-developed and well-nourished. No distress.  HENT:  Head: Normocephalic and atraumatic.  Eyes: Conjunctivae are normal. Pupils are equal, round, and reactive to light.  Neck: Normal range of motion.  Cardiovascular: Normal rate, regular rhythm, normal heart sounds and intact distal pulses.  Pulses:      Radial pulses are 2+ on the right side, and 2+ on the left side.  Right upper extremity dialysis graft: A pulsatile thrill and bruit.  Skin is intact.   Pulmonary/Chest: Effort normal and breath sounds normal.    Musculoskeletal: Normal range of motion. She exhibits no edema.  Neurological: She is alert and oriented to person, place, and time.  Skin: Skin is warm and dry.  Psychiatric: She has a normal mood and affect. Her behavior is normal. Judgment and thought content normal.  Vitals reviewed.  BP (!) 201/81 (BP Location: Left Arm, Patient Position: Sitting)   Pulse 72   Resp 14   Ht 4\' 10"  (1.473 m)   Wt 103 lb (46.7 kg)   LMP 09/19/2001 (Approximate)   BMI 21.53 kg/m   Past Medical History:  Diagnosis Date  . Acute cystitis with hematuria   . Anemia   . Cataracts, bilateral   . CKD (chronic kidney disease)   . Diabetic retinopathy, background ([place])   . Dialysis patient ([place])    Tues, Thurs, Sat  . Dizziness   . Hyperlipidemia   . Hypertension   . Migraine variant   . Neuropathy   . OP (osteoporosis)   . Personal history of noncompliance with medical treatment, presenting hazards to health   . Steal syndrome as complication of dialysis access ([place])   . Stroke ([place])   . Type II diabetes mellitus with ophthalmic manifestations, uncontrolled ([place])   . Vitamin D deficiency    Social History   Socioeconomic History  . Marital status: Widowed    Spouse name: Not on file  . Number of children: Not on file  . Years of education: Not on file  . Highest education level: Not on file  Social Needs  . Financial resource strain: Not  on file  . Food insecurity - worry: Not on file  . Food insecurity - inability: Not on file  . Transportation needs - medical: Not on file  . Transportation needs - non-medical: Not on file  Occupational History  . Not on file  Tobacco Use  . Smoking status: Never Smoker  . Smokeless tobacco: Never Used  Substance and Sexual Activity  . Alcohol use: No  . Drug use: No  . Sexual activity: No    Birth control/protection: Abstinence  Other Topics Concern  . Not on file  Social History Narrative   Lives at home with family   Past Surgical  History:  Procedure Laterality Date  . AV FISTULA PLACEMENT Right 10/10/2016   Procedure: ARTERIOVENOUS (AV) FISTULA CREATION ( BRACHIOCEPHALIC );  Surgeon: Algernon Huxley, MD;  Location: [facility];  Service: Vascular;  Laterality: Right;  . DILATION AND CURETTAGE OF UTERUS    . DISTAL REVASCULARIZATION AND INTERVAL LIGATION (DRIL) Right 11/08/2016   Procedure: DISTAL REVASCULARIZATION AND INTERVAL LIGATION (DRIL);  Surgeon: Algernon Huxley, MD;  Location: [facility];  Service: Vascular;  Laterality: Right;  . PERIPHERAL VASCULAR CATHETERIZATION N/A 04/25/2016   Procedure: Dialysis/Perma Catheter Insertion;  Surgeon: Algernon Huxley, MD;  Location: [place];  Service: Cardiovascular;  Laterality: N/A;  . PERIPHERAL VASCULAR CATHETERIZATION Right 10/17/2016   Procedure: Upper Extremity Angiography;  Surgeon: Algernon Huxley, MD;  Location: [facility];  Service: Cardiovascular;  Laterality: Right;   Family History  Problem Relation Age of Onset  . Hypertension Mother   . Hyperlipidemia Mother    Allergies  Allergen Reactions  . Propofol Itching    Pt says " when getting anesthesia by body itches" unsure what type of anesthesia      Assessment & Plan:  Patient presents at the request of her dialysis center to undergo a right upper extremity hemodialysis access duplex exam.  Patient seen and examined in the presence of an interpreter.  The patient's dialysis center is experiencing cannulation issues, low flow rates and prolonged bleeding.  The patient denies any right upper extremity pain or ulceration.  The patient denies or vomiting.  The patient underwent a right upper extremity dialysis access duplex which was notable for thrombus noted with vessel narrowing and elevated velocities at the level of the right cephalic vein confluence level.  Evidence of turbulent flow in the right subclavian vein.  Flow volume of 1082.  1. ESRD on dialysis ([place]) - Stable  Patient presents at the request of  her dialysis center for hard cannulations, low flow rate and prolonged bleeding. Patient was found to have a significant stenosis at the level of the right cephalic vein confluence. Recommend a right upper extremity shuntogram and attempt to assess the patient's anatomy and if appropriate correct the area of stenosis at that time to restore appropriate function to the patient's graft Procedure, risks and benefits explained to the patient All questions answered Patient wishes to proceed  2. Complication from renal dialysis device, subsequent encounter - Stable As above  Current Outpatient Medications on File Prior to Visit  Medication Sig Dispense Refill  . acetaminophen (TYLENOL) 500 MG tablet Take 1,000 mg by mouth every 6 (six) hours as needed for mild pain or headache.    Marland Kitchen amLODipine (NORVASC) 10 MG tablet Take 1 tablet (10 mg total) by mouth daily. 30 tablet 0  . aspirin 81 MG EC tablet TAKE 1 TABLET BY MOUTH EVERY DAY 150  tablet 0  . atorvastatin (LIPITOR) 40 MG tablet Take 40 mg by mouth daily at 6 PM.     . calcium acetate (PHOSLO) 667 MG capsule Take 1 capsule (667 mg total) by mouth 3 (three) times daily with meals. 90 capsule 0  . docusate sodium (COLACE) 100 MG capsule Take 100 mg by mouth 2 (two) times daily.    . furosemide (LASIX) 20 MG tablet Take 20 mg by mouth daily.    . hydrALAZINE (APRESOLINE) 25 MG tablet Take 1 tablet (25 mg total) by mouth 3 (three) times daily. 90 tablet 0  . hydrOXYzine (ATARAX/VISTARIL) 25 MG tablet Take 25 mg by mouth 2 (two) times daily as needed for itching.    . insulin NPH-regular Human (NOVOLIN 70/30) (70-30) 100 UNIT/ML injection Inject 6 Units into the skin 2 (two) times daily with a meal.     . lidocaine-prilocaine (EMLA) cream Apply 1 application topically as needed.    Marland Kitchen losartan (COZAAR) 50 MG tablet Take 1 tablet (50 mg total) by mouth daily. 30 tablet 0  . Nutritional Supplements (FEEDING SUPPLEMENT, NEPRO CARB STEADY,) LIQD Take 237  mLs by mouth 2 (two) times daily between meals. 60 Can 0  . sodium bicarbonate 650 MG tablet Take 1,300 mg by mouth 2 (two) times daily.     No current facility-administered medications on file prior to visit.    There are no Patient Instructions on file for this visit. No Follow-up on file.  Sephora Boyar A Lina Hitch, PA-C

## 2018-01-21 ENCOUNTER — Other Ambulatory Visit (INDEPENDENT_AMBULATORY_CARE_PROVIDER_SITE_OTHER): Payer: Self-pay | Admitting: Vascular Surgery

## 2018-01-22 DIAGNOSIS — R519 Headache, unspecified: Secondary | ICD-10-CM | POA: Insufficient documentation

## 2018-01-25 MED ORDER — CEFAZOLIN SODIUM-DEXTROSE 1-4 GM/50ML-% IV SOLN: 1.0000 g | Freq: Once | INTRAVENOUS | Status: DC

## 2018-01-26 ENCOUNTER — Other Ambulatory Visit: Payer: Self-pay

## 2018-01-26 ENCOUNTER — Inpatient Hospital Stay: Payer: Medicaid Other

## 2018-01-26 ENCOUNTER — Encounter
Admission: RE | Disposition: A | Discharge status: Home / Self Care | Payer: Self-pay | Source: Ambulatory Visit | Attending: Internal Medicine

## 2018-01-26 ENCOUNTER — Inpatient Hospital Stay
Admission: RE | Admit: 2018-01-26 | Discharge: 2018-01-27 | DRG: 673 | Disposition: A | Discharge status: Home / Self Care | Payer: Medicaid Other | Source: Ambulatory Visit | Attending: Internal Medicine | Admitting: Internal Medicine

## 2018-01-26 DIAGNOSIS — N2581 Secondary hyperparathyroidism of renal origin: Secondary | ICD-10-CM | POA: Diagnosis present

## 2018-01-26 DIAGNOSIS — I10 Essential (primary) hypertension: Secondary | ICD-10-CM | POA: Diagnosis present

## 2018-01-26 DIAGNOSIS — E559 Vitamin D deficiency, unspecified: Secondary | ICD-10-CM | POA: Diagnosis present

## 2018-01-26 DIAGNOSIS — Z7982 Long term (current) use of aspirin: Secondary | ICD-10-CM

## 2018-01-26 DIAGNOSIS — T82510A Breakdown (mechanical) of surgically created arteriovenous fistula, initial encounter: Secondary | ICD-10-CM | POA: Diagnosis present

## 2018-01-26 DIAGNOSIS — Y838 Other surgical procedures as the cause of abnormal reaction of the patient, or of later complication, without mention of misadventure at the time of the procedure: Secondary | ICD-10-CM | POA: Diagnosis present

## 2018-01-26 DIAGNOSIS — Y712 Prosthetic and other implants, materials and accessory cardiovascular devices associated with adverse incidents: Secondary | ICD-10-CM | POA: Diagnosis present

## 2018-01-26 DIAGNOSIS — E785 Hyperlipidemia, unspecified: Secondary | ICD-10-CM | POA: Diagnosis present

## 2018-01-26 DIAGNOSIS — Z794 Long term (current) use of insulin: Secondary | ICD-10-CM | POA: Diagnosis not present

## 2018-01-26 DIAGNOSIS — T82898A Other specified complication of vascular prosthetic devices, implants and grafts, initial encounter: Secondary | ICD-10-CM | POA: Diagnosis present

## 2018-01-26 DIAGNOSIS — Z8249 Family history of ischemic heart disease and other diseases of the circulatory system: Secondary | ICD-10-CM | POA: Diagnosis not present

## 2018-01-26 DIAGNOSIS — E1122 Type 2 diabetes mellitus with diabetic chronic kidney disease: Secondary | ICD-10-CM | POA: Diagnosis present

## 2018-01-26 DIAGNOSIS — Z8349 Family history of other endocrine, nutritional and metabolic diseases: Secondary | ICD-10-CM | POA: Diagnosis not present

## 2018-01-26 DIAGNOSIS — N186 End stage renal disease: Secondary | ICD-10-CM | POA: Diagnosis present

## 2018-01-26 DIAGNOSIS — M810 Age-related osteoporosis without current pathological fracture: Secondary | ICD-10-CM | POA: Diagnosis present

## 2018-01-26 DIAGNOSIS — Z8673 Personal history of transient ischemic attack (TIA), and cerebral infarction without residual deficits: Secondary | ICD-10-CM

## 2018-01-26 DIAGNOSIS — E11319 Type 2 diabetes mellitus with unspecified diabetic retinopathy without macular edema: Secondary | ICD-10-CM | POA: Diagnosis present

## 2018-01-26 DIAGNOSIS — Z888 Allergy status to other drugs, medicaments and biological substances status: Secondary | ICD-10-CM

## 2018-01-26 DIAGNOSIS — I120 Hypertensive chronic kidney disease with stage 5 chronic kidney disease or end stage renal disease: Principal | ICD-10-CM | POA: Diagnosis present

## 2018-01-26 DIAGNOSIS — D631 Anemia in chronic kidney disease: Secondary | ICD-10-CM | POA: Diagnosis present

## 2018-01-26 DIAGNOSIS — Z992 Dependence on renal dialysis: Secondary | ICD-10-CM

## 2018-01-26 DIAGNOSIS — E1140 Type 2 diabetes mellitus with diabetic neuropathy, unspecified: Secondary | ICD-10-CM | POA: Diagnosis present

## 2018-01-26 DIAGNOSIS — T82868A Thrombosis of vascular prosthetic devices, implants and grafts, initial encounter: Secondary | ICD-10-CM

## 2018-01-26 HISTORY — PX: A/V SHUNTOGRAM: CATH118297

## 2018-01-26 HISTORY — PX: A/V SHUNT INTERVENTION: CATH118220

## 2018-01-26 LAB — CBC
HCT: 37.3 % (ref 35.0–47.0)
Hemoglobin: 12.4 g/dL (ref 12.0–16.0)
MCH: 32.1 pg (ref 26.0–34.0)
MCHC: 33.3 g/dL (ref 32.0–36.0)
MCV: 96.4 fL (ref 80.0–100.0)
PLATELETS: 176 10*3/uL (ref 150–440)
RBC: 3.87 MIL/uL (ref 3.80–5.20)
RDW: 15.2 % — ABNORMAL HIGH (ref 11.5–14.5)
WBC: 6.2 10*3/uL (ref 3.6–11.0)

## 2018-01-26 LAB — POTASSIUM (ARMC VASCULAR LAB ONLY): Potassium (ARMC vascular lab): 5.2 — ABNORMAL HIGH (ref 3.5–5.1)

## 2018-01-26 LAB — GLUCOSE, CAPILLARY
GLUCOSE-CAPILLARY: 134 mg/dL — AB (ref 65–99)
Glucose-Capillary: 168 mg/dL — ABNORMAL HIGH (ref 65–99)
Glucose-Capillary: 142 mg/dL — ABNORMAL HIGH (ref 65–99)

## 2018-01-26 LAB — CREATININE, SERUM
Creatinine, Ser: 5.72 mg/dL — ABNORMAL HIGH (ref 0.44–1.00)
GFR calc Af Amer: 8 mL/min — ABNORMAL LOW (ref >60)
GFR calc non Af Amer: 7 mL/min — ABNORMAL LOW (ref >60)

## 2018-01-26 SURGERY — A/V SHUNTOGRAM
Anesthesia: Moderate Sedation | Laterality: Right

## 2018-01-26 MED ORDER — ACETAMINOPHEN 325 MG PO TABS: 650.0000 mg | ORAL_TABLET | Freq: Four times a day (QID) | ORAL | Status: DC | PRN

## 2018-01-26 MED ORDER — SODIUM CHLORIDE 0.9 % IV SOLN: INTRAVENOUS | Status: DC

## 2018-01-26 MED ORDER — SODIUM CHLORIDE 0.9% FLUSH
3.0000 mL | Freq: Two times a day (BID) | INTRAVENOUS | Status: DC
  Administered 2018-01-26 – 2018-01-27 (×2): 3 mL via INTRAVENOUS

## 2018-01-26 MED ORDER — ATORVASTATIN CALCIUM 20 MG PO TABS: 40.0000 mg | ORAL_TABLET | Freq: Every day | ORAL | Status: DC

## 2018-01-26 MED ORDER — ONDANSETRON HCL 4 MG/2ML IJ SOLN
INTRAMUSCULAR | Status: AC
  Filled 2018-01-26: qty 2

## 2018-01-26 MED ORDER — METHYLPREDNISOLONE SODIUM SUCC 125 MG IJ SOLR: 125.0000 mg | INTRAMUSCULAR | Status: DC | PRN

## 2018-01-26 MED ORDER — IOPAMIDOL (ISOVUE-300) INJECTION 61%
INTRAVENOUS | Status: DC | PRN
  Administered 2018-01-26: 35 mL via INTRAVENOUS

## 2018-01-26 MED ORDER — FENTANYL CITRATE (PF) 100 MCG/2ML IJ SOLN
INTRAMUSCULAR | Status: AC
  Filled 2018-01-26: qty 2

## 2018-01-26 MED ORDER — HEPARIN (PORCINE) IN NACL 2-0.9 UNIT/ML-% IJ SOLN
INTRAMUSCULAR | Status: AC
  Filled 2018-01-26: qty 1000

## 2018-01-26 MED ORDER — HEPARIN SODIUM (PORCINE) 1000 UNIT/ML IJ SOLN
INTRAMUSCULAR | Status: DC | PRN
  Administered 2018-01-26: 3000 [IU] via INTRAVENOUS

## 2018-01-26 MED ORDER — HYDRALAZINE HCL 20 MG/ML IJ SOLN
INTRAMUSCULAR | Status: AC
  Filled 2018-01-26: qty 1

## 2018-01-26 MED ORDER — FENTANYL CITRATE (PF) 100 MCG/2ML IJ SOLN
INTRAMUSCULAR | Status: DC | PRN
  Administered 2018-01-26: 50 ug via INTRAVENOUS

## 2018-01-26 MED ORDER — ONDANSETRON HCL 4 MG PO TABS: 4.0000 mg | ORAL_TABLET | Freq: Four times a day (QID) | ORAL | Status: DC | PRN

## 2018-01-26 MED ORDER — HYDROXYZINE HCL 25 MG PO TABS: 25.0000 mg | ORAL_TABLET | Freq: Two times a day (BID) | ORAL | Status: DC | PRN

## 2018-01-26 MED ORDER — HYDRALAZINE HCL 25 MG PO TABS
25.0000 mg | ORAL_TABLET | Freq: Three times a day (TID) | ORAL | Status: DC
  Administered 2018-01-26 – 2018-01-27 (×3): 25 mg via ORAL
  Filled 2018-01-26 (×3): qty 1

## 2018-01-26 MED ORDER — NEPRO/CARBSTEADY PO LIQD
237.0000 mL | Freq: Two times a day (BID) | ORAL | Status: DC
  Administered 2018-01-27 (×2): 237 mL via ORAL

## 2018-01-26 MED ORDER — ACETAMINOPHEN 500 MG PO TABS: 1000.0000 mg | ORAL_TABLET | Freq: Four times a day (QID) | ORAL | Status: DC | PRN

## 2018-01-26 MED ORDER — HEPARIN SODIUM (PORCINE) 5000 UNIT/ML IJ SOLN
5000.0000 [IU] | Freq: Three times a day (TID) | INTRAMUSCULAR | Status: DC
  Administered 2018-01-26 – 2018-01-27 (×2): 5000 [IU] via SUBCUTANEOUS
  Filled 2018-01-26 (×3): qty 1

## 2018-01-26 MED ORDER — HYDRALAZINE HCL 20 MG/ML IJ SOLN
10.0000 mg | Freq: Four times a day (QID) | INTRAMUSCULAR | Status: DC | PRN
  Administered 2018-01-26: 10 mg via INTRAVENOUS
  Filled 2018-01-26: qty 1

## 2018-01-26 MED ORDER — DOCUSATE SODIUM 100 MG PO CAPS
100.0000 mg | ORAL_CAPSULE | Freq: Two times a day (BID) | ORAL | Status: DC
  Administered 2018-01-26: 100 mg via ORAL
  Filled 2018-01-26: qty 1

## 2018-01-26 MED ORDER — HEPARIN SODIUM (PORCINE) 1000 UNIT/ML IJ SOLN
INTRAMUSCULAR | Status: AC
  Filled 2018-01-26: qty 1

## 2018-01-26 MED ORDER — LOSARTAN POTASSIUM 50 MG PO TABS
50.0000 mg | ORAL_TABLET | Freq: Every day | ORAL | Status: DC
  Administered 2018-01-27: 50 mg via ORAL
  Filled 2018-01-26 (×2): qty 1

## 2018-01-26 MED ORDER — ONDANSETRON HCL 4 MG/2ML IJ SOLN
4.0000 mg | Freq: Four times a day (QID) | INTRAMUSCULAR | Status: DC | PRN
Start: 2018-01-26 — End: 2018-01-27
  Administered 2018-01-26: 4 mg via INTRAVENOUS

## 2018-01-26 MED ORDER — MIDAZOLAM HCL 2 MG/2ML IJ SOLN
INTRAMUSCULAR | Status: DC | PRN
  Administered 2018-01-26: 2 mg via INTRAVENOUS

## 2018-01-26 MED ORDER — INSULIN ASPART 100 UNIT/ML ~~LOC~~ SOLN
0.0000 [IU] | Freq: Three times a day (TID) | SUBCUTANEOUS | Status: DC
  Administered 2018-01-26: 1 [IU] via SUBCUTANEOUS
  Administered 2018-01-27: 2 [IU] via SUBCUTANEOUS
  Filled 2018-01-26 (×2): qty 1

## 2018-01-26 MED ORDER — ONDANSETRON HCL 4 MG/2ML IJ SOLN
4.0000 mg | Freq: Four times a day (QID) | INTRAMUSCULAR | Status: DC | PRN
  Administered 2018-01-26: 4 mg via INTRAVENOUS
  Filled 2018-01-26: qty 2

## 2018-01-26 MED ORDER — INSULIN ASPART PROT & ASPART (70-30 MIX) 100 UNIT/ML ~~LOC~~ SUSP
6.0000 [IU] | Freq: Two times a day (BID) | SUBCUTANEOUS | Status: DC
  Administered 2018-01-26 – 2018-01-27 (×3): 6 [IU] via SUBCUTANEOUS
  Filled 2018-01-26 (×3): qty 1

## 2018-01-26 MED ORDER — ASPIRIN EC 81 MG PO TBEC
81.0000 mg | DELAYED_RELEASE_TABLET | Freq: Every day | ORAL | Status: DC
  Administered 2018-01-26 – 2018-01-27 (×2): 81 mg via ORAL
  Filled 2018-01-26 (×2): qty 1

## 2018-01-26 MED ORDER — MIDAZOLAM HCL 5 MG/5ML IJ SOLN
INTRAMUSCULAR | Status: AC
  Filled 2018-01-26: qty 5

## 2018-01-26 MED ORDER — LOSARTAN POTASSIUM 50 MG PO TABS
50.0000 mg | ORAL_TABLET | Freq: Once | ORAL | Status: AC
  Administered 2018-01-26: 50 mg via ORAL
  Filled 2018-01-26: qty 1

## 2018-01-26 MED ORDER — SODIUM CHLORIDE 0.9 % IV SOLN: 250.0000 mL | INTRAVENOUS | Status: DC | PRN

## 2018-01-26 MED ORDER — SUMATRIPTAN SUCCINATE 50 MG PO TABS
25.0000 mg | ORAL_TABLET | ORAL | Status: DC | PRN
  Filled 2018-01-26: qty 1

## 2018-01-26 MED ORDER — ACETAMINOPHEN 650 MG RE SUPP: 650.0000 mg | Freq: Four times a day (QID) | RECTAL | Status: DC | PRN

## 2018-01-26 MED ORDER — SODIUM BICARBONATE 650 MG PO TABS
1300.0000 mg | ORAL_TABLET | Freq: Two times a day (BID) | ORAL | Status: DC
  Administered 2018-01-26 (×2): 1300 mg via ORAL
  Filled 2018-01-26 (×2): qty 2

## 2018-01-26 MED ORDER — POLYETHYLENE GLYCOL 3350 17 G PO PACK: 17.0000 g | PACK | Freq: Every day | ORAL | Status: DC | PRN

## 2018-01-26 MED ORDER — TRAMADOL HCL 50 MG PO TABS: 50.0000 mg | ORAL_TABLET | Freq: Four times a day (QID) | ORAL | Status: DC | PRN

## 2018-01-26 MED ORDER — TRAMADOL HCL 50 MG PO TABS
ORAL_TABLET | ORAL | Status: AC
  Administered 2018-01-26: 50 mg via ORAL
  Filled 2018-01-26: qty 1

## 2018-01-26 MED ORDER — LIDOCAINE-EPINEPHRINE (PF) 1 %-1:200000 IJ SOLN
INTRAMUSCULAR | Status: AC
  Filled 2018-01-26: qty 30

## 2018-01-26 MED ORDER — HYDRALAZINE HCL 20 MG/ML IJ SOLN
INTRAMUSCULAR | Status: DC | PRN
  Administered 2018-01-26 (×2): 10 mg via INTRAVENOUS

## 2018-01-26 MED ORDER — CALCIUM ACETATE (PHOS BINDER) 667 MG PO CAPS
667.0000 mg | ORAL_CAPSULE | Freq: Three times a day (TID) | ORAL | Status: DC
  Administered 2018-01-26 – 2018-01-27 (×4): 667 mg via ORAL
  Filled 2018-01-26 (×4): qty 1

## 2018-01-26 MED ORDER — ATORVASTATIN CALCIUM 20 MG PO TABS
40.0000 mg | ORAL_TABLET | Freq: Every day | ORAL | Status: DC
  Administered 2018-01-26: 40 mg via ORAL
  Filled 2018-01-26: qty 2

## 2018-01-26 MED ORDER — HYDROMORPHONE HCL 1 MG/ML IJ SOLN: 1.0000 mg | Freq: Once | INTRAMUSCULAR | Status: DC | PRN

## 2018-01-26 MED ORDER — TRAMADOL HCL 50 MG PO TABS
50.0000 mg | ORAL_TABLET | Freq: Once | ORAL | Status: AC
  Administered 2018-01-26: 50 mg via ORAL

## 2018-01-26 MED ORDER — FAMOTIDINE 20 MG PO TABS: 40.0000 mg | ORAL_TABLET | ORAL | Status: DC | PRN

## 2018-01-26 MED ORDER — FUROSEMIDE 20 MG PO TABS
20.0000 mg | ORAL_TABLET | Freq: Every day | ORAL | Status: DC
  Administered 2018-01-26 – 2018-01-27 (×2): 20 mg via ORAL
  Filled 2018-01-26 (×2): qty 1

## 2018-01-26 MED ORDER — AMLODIPINE BESYLATE 10 MG PO TABS
10.0000 mg | ORAL_TABLET | Freq: Every day | ORAL | Status: DC
  Administered 2018-01-26 – 2018-01-27 (×2): 10 mg via ORAL
  Filled 2018-01-26 (×2): qty 1

## 2018-01-26 SURGICAL SUPPLY — 15 items
BALLN LUTONIX DCB 7X60X130 (BALLOONS) ×4
BALLOON LUTONIX DCB 7X60X130 (BALLOONS) ×2 IMPLANT
CANNULA 5F STIFF (CANNULA) ×4 IMPLANT
CATH BEACON 5 .035 65 KMP TIP (CATHETERS) ×4 IMPLANT
DEVICE PRESTO INFLATION (MISCELLANEOUS) ×4 IMPLANT
DRAPE BRACHIAL (DRAPES) ×4 IMPLANT
PACK ANGIOGRAPHY (CUSTOM PROCEDURE TRAY) ×4 IMPLANT
SHEATH BRITE TIP 6FRX5.5 (SHEATH) ×4 IMPLANT
SHEATH BRITE TIP 7FRX5.5 (SHEATH) ×4 IMPLANT
STENT VIABAHN 8X50X120 (Permanent Stent) ×4 IMPLANT
STENT VIABAHN5X120X8X (Permanent Stent) ×2 IMPLANT
SUT MNCRL AB 4-0 PS2 18 (SUTURE) ×4 IMPLANT
TOWEL OR 17X26 4PK STRL BLUE (TOWEL DISPOSABLE) ×4 IMPLANT
WIRE G V18X300CM (WIRE) ×4 IMPLANT
WIRE MAGIC TOR.035 180C (WIRE) ×4 IMPLANT

## 2018-01-26 NOTE — Progress Notes (Signed)
Patient SBP > 190 and had an episode of Vomiting. Complains of a headache and blurred vision. Neuro check performed and no signs of abnormalities at this time. Dr. Lucky Cowboy notified and requested for hospitalist service to see her. Dr Vianne Bulls notifed. Patient resting quietly on stretcher with side rails up. No complaints at this time. Waiting for Dr. Vianne Bulls

## 2018-01-26 NOTE — H&P (Signed)
[facility] NAME: Anita Contreras    MR#:  [id number]  DATE OF BIRTH:  19-Nov-1956  DATE OF ADMISSION:  01/26/2018  PRIMARY CARE PHYSICIAN: Arlis Porta, FNP   REQUESTING/REFERRING PHYSICIAN: dr dew  CHIEF COMPLAINT:   Elevated BP HISTORY OF PRESENT ILLNESS:  Anita Contreras  is a 62 y.o. female with a known history of end-stage renal disease on hemodialysis, essential hypertension and diabetes who presented today to specials for angioplasty of cephalic vein due to poorly functional right brachiocephalic AV fistula. Patient was planned for discharge after her procedure however patient was complaining of significant headache as well as some blurred vision. It was noted that her systolic blood pressure was in the 190s. Patient reports she did take her blood pressure medications this morning. Patient has had headaches for the past month. She does not know if this occurs with elevated blood pressure. She denies chest pain, shortness of breath. During her her headache she also has nausea and vomiting. She denies photophobia. She denies neck stiffness or fevers.   PAST MEDICAL HISTORY:   Past Medical History:  Diagnosis Date  . Acute cystitis with hematuria   . Anemia   . Cataracts, bilateral   . CKD (chronic kidney disease)   . Diabetic retinopathy, background ([place])   . Dialysis patient ([place])    Tues, Thurs, Sat  . Dizziness   . Hyperlipidemia   . Hypertension   . Migraine variant   . Neuropathy   . OP (osteoporosis)   . Personal history of noncompliance with medical treatment, presenting hazards to health   . Steal syndrome as complication of dialysis access ([place])   . Stroke ([place])   . Type II diabetes mellitus with ophthalmic manifestations, uncontrolled ([place])   . Vitamin D deficiency     PAST SURGICAL HISTORY:   Past Surgical History:  Procedure Laterality Date  . AV FISTULA PLACEMENT Right 10/10/2016    Procedure: ARTERIOVENOUS (AV) FISTULA CREATION ( BRACHIOCEPHALIC );  Surgeon: Algernon Huxley, MD;  Location: [facility];  Service: Vascular;  Laterality: Right;  . DILATION AND CURETTAGE OF UTERUS    . DISTAL REVASCULARIZATION AND INTERVAL LIGATION (DRIL) Right 11/08/2016   Procedure: DISTAL REVASCULARIZATION AND INTERVAL LIGATION (DRIL);  Surgeon: Algernon Huxley, MD;  Location: [facility];  Service: Vascular;  Laterality: Right;  . PERIPHERAL VASCULAR CATHETERIZATION N/A 04/25/2016   Procedure: Dialysis/Perma Catheter Insertion;  Surgeon: Algernon Huxley, MD;  Location: [facility];  Service: Cardiovascular;  Laterality: N/A;  . PERIPHERAL VASCULAR CATHETERIZATION Right 10/17/2016   Procedure: Upper Extremity Angiography;  Surgeon: Algernon Huxley, MD;  Location: [facility] [facility];  Service: Cardiovascular;  Laterality: Right;    SOCIAL HISTORY:   Social History   Tobacco Use  . Smoking status: Never Smoker  . Smokeless tobacco: Never Used  Substance Use Topics  . Alcohol use: No    FAMILY HISTORY:   Family History  Problem Relation Age of Onset  . Hypertension Mother   . Hyperlipidemia Mother     DRUG ALLERGIES:   Allergies  Allergen Reactions  . Propofol Itching    Pt says " when getting anesthesia by body itches" unsure what type of anesthesia    REVIEW OF SYSTEMS:   Review of Systems  Constitutional: Negative.  Negative for chills, fever and malaise/fatigue.  HENT: Negative.  Negative for ear discharge, ear pain, hearing loss, nosebleeds and sore throat.  Eyes: Negative.  Negative for blurred vision and pain.  Respiratory: Negative.  Negative for cough, hemoptysis, shortness of breath and wheezing.   Cardiovascular: Negative.  Negative for chest pain, palpitations and leg swelling.  Gastrointestinal: Negative.  Negative for abdominal pain, blood in stool, diarrhea, nausea and vomiting.  Genitourinary: Negative.  Negative for dysuria.  Musculoskeletal: Negative.   Negative for back pain.  Skin: Negative.   Neurological: Positive for headaches. Negative for dizziness, tremors, speech change, focal weakness and seizures.  Endo/Heme/Allergies: Negative.  Does not bruise/bleed easily.  Psychiatric/Behavioral: Negative.  Negative for depression, hallucinations and suicidal ideas.    MEDICATIONS AT HOME:   Prior to Admission medications   Medication Sig Start Date End Date Taking? Authorizing Provider  acetaminophen (TYLENOL) 500 MG tablet Take 1,000 mg by mouth every 6 (six) hours as needed for mild pain or headache.    [provider]  amLODipine (NORVASC) 10 MG tablet Take 1 tablet (10 mg total) by mouth daily. 04/19/16   Fritzi Mandes, MD  aspirin 81 MG EC tablet TAKE 1 TABLET BY MOUTH EVERY DAY 04/15/17   Stegmayer, Joelene Millin A, PA-C  atorvastatin (LIPITOR) 40 MG tablet Take 40 mg by mouth daily at 6 PM.     [provider]  calcium acetate (PHOSLO) 667 MG capsule Take 1 capsule (667 mg total) by mouth 3 (three) times daily with meals. 04/19/16   Fritzi Mandes, MD  docusate sodium (COLACE) 100 MG capsule Take 100 mg by mouth 2 (two) times daily.    [provider]  furosemide (LASIX) 20 MG tablet Take 20 mg by mouth daily.    [provider]  hydrALAZINE (APRESOLINE) 25 MG tablet Take 1 tablet (25 mg total) by mouth 3 (three) times daily. 04/19/16   Fritzi Mandes, MD  hydrOXYzine (ATARAX/VISTARIL) 25 MG tablet Take 25 mg by mouth 2 (two) times daily as needed for itching.    [provider]  insulin NPH-regular Human (NOVOLIN 70/30) (70-30) 100 UNIT/ML injection Inject 6 Units into the skin 2 (two) times daily with a meal.     [provider]  lidocaine-prilocaine (EMLA) cream Apply 1 application topically as needed.    [provider]  losartan (COZAAR) 50 MG tablet Take 1 tablet (50 mg total) by mouth daily. 05/07/16   Epifanio Lesches, MD  Nutritional Supplements (FEEDING SUPPLEMENT, NEPRO CARB  STEADY,) LIQD Take 237 mLs by mouth 2 (two) times daily between meals. 05/07/16   Epifanio Lesches, MD  sodium bicarbonate 650 MG tablet Take 1,300 mg by mouth 2 (two) times daily.    [provider]      VITAL SIGNS:  Blood pressure (!) 192/72, pulse 70, temperature 98.3 F (36.8 C), temperature source Oral, resp. rate 18, height 4\' 10"  (1.473 m), weight 46.7 kg (103 lb), last menstrual period 09/19/2001, SpO2 94 %.  PHYSICAL EXAMINATION:   Physical Exam  Constitutional: She is oriented to person, place, and time and well-developed, well-nourished, and in no distress. No distress.  HENT:  Head: Normocephalic.  Eyes: No scleral icterus.  Neck: Normal range of motion. Neck supple. No JVD present. No tracheal deviation present.  Cardiovascular: Normal rate, regular rhythm and normal heart sounds. Exam reveals no gallop and no friction rub.  No murmur heard. Pulmonary/Chest: Effort normal and breath sounds normal. No respiratory distress. She has no wheezes. She has no rales. She exhibits no tenderness.  Abdominal: Soft. Bowel sounds are normal. She exhibits no distension and no mass.  There is no tenderness. There is no rebound and no guarding.  Musculoskeletal: Normal range of motion. She exhibits no edema.  Neurological: She is alert and oriented to person, place, and time.  Skin: Skin is warm. No rash noted. No erythema.  Psychiatric: Affect and judgment normal.      LABORATORY PANEL:   CBC No results for input(s): WBC, HGB, HCT, PLT in the last 168 hours. ------------------------------------------------------------------------------------------------------------------  Chemistries  No results for input(s): NA, K, CL, CO2, GLUCOSE, BUN, CREATININE, CALCIUM, MG, AST, ALT, ALKPHOS, BILITOT in the last 168 hours.  Invalid input(s): GFRCGP ------------------------------------------------------------------------------------------------------------------  Cardiac  Enzymes No results for input(s): TROPONINI in the last 168 hours. ------------------------------------------------------------------------------------------------------------------  RADIOLOGY:  No results found.  EKG:  None  IMPRESSION AND PLAN:    62 year female with end-stage renal disease on hemodialysis, essential hypertension and diabetes who presented to specials today for cannulation of the AV fistula who then had elevated blood pressure.  1. Accelerated essential hypertension with headache and vision changes Restart oral medications Monitor blood pressure IV hydralazine ordered when necessary  2. Headache: This could be related to accelerated essential hypertension CT head noncontrast ordered Neurology consultation requested Try Imitrex when necessary  3. End-stage renal disease on hemodialysis: Patient will need dialysis tomorrow. Nephrology consultation requested.  4. Diabetes: Continue outpatient medication with ADA diet and sliding scale    All the records are reviewed and case discussed with ED provider. Management plans discussed with the patient and she is  in agreement  CODE STATUS: Full  TOTAL TIME TAKING CARE OF THIS PATIENT: 40  minutes.    Fredrick Dray M.D on 01/26/2018 at 12:14 PM  Between 7am to 6pm - Pager - [phone]  After 6pm go to [email or web address] - password [email or web address] [facility]  Office  [phone]  CC: Primary care physician; Arlis Porta, FNP

## 2018-01-26 NOTE — Progress Notes (Signed)
BP still elevated.  No complaints of pain.

## 2018-01-26 NOTE — H&P (Signed)
[facility] History & Physical Update  The patient was interviewed and re-examined.  The patient's previous History and Physical has been reviewed and is unchanged.  There is no change in the plan of care. We plan to proceed with the scheduled procedure.  [name], MD  01/26/2018, 9:38 AM

## 2018-01-26 NOTE — Op Note (Signed)
[facility]    OPERATIVE NOTE   PROCEDURE: 1.   Right brachiocephalic arteriovenous fistula cannulation under ultrasound guidance 2.   Right arm fistulagram including central venogram 3.   Percutaneous transluminal angioplasty of the cephalic vein at the cephalic vein subclavian vein confluence with 7 mm diameter by 6 cm length Lutonix drug-coated angioplasty balloon 4.  Viabahn stent placement for extravasation and residual greater than 50% stenosis after angioplasty of the cephalic vein at and just before the cephalic vein subclavian vein confluence with an 8 mm diameter by 5 cm length Viabahn stent  PRE-OPERATIVE DIAGNOSIS: 1. ESRD 2. Poorly functional right brachiocephalic AVF  POST-OPERATIVE DIAGNOSIS: same as above   SURGEON: [name], MD  ANESTHESIA: local with MCS  ESTIMATED BLOOD LOSS: 15 cc  FINDING(S): 1. 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  The remainder of the central venous circulation appeared patent.  There was some narrowing around the sheath at the access site, although that did not appear as stenotic on ultrasound during access and it was unclear how much of that was having a large sheath fistula.  The arterial anastomosis itself appeared widely patent.  SPECIMEN(S):  None  CONTRAST: 35 cc  FLUORO TIME: 1.6 minutes  MODERATE CONSCIOUS SEDATION TIME: Approximately 20 minutes with 2 mg of Versed and 50 Mcg of Fentanyl   INDICATIONS: Anita Contreras is a 62 y.o. female who presents with malfunctioning right brachiocephalic arteriovenous fistula.  The patient is scheduled for right arm fistulagram.  The patient is aware the risks include but are not limited to: bleeding, infection, thrombosis of the cannulated access, and possible anaphylactic reaction to the contrast.  The patient is aware of the risks of the procedure and elects to proceed forward.  DESCRIPTION: After  full informed written consent was obtained, the patient was brought back to the angiography suite and placed supine upon the angiography table.  The patient was connected to monitoring equipment. Moderate conscious sedation was administered with a face to face encounter with the patient throughout the procedure with my supervision of the RN administering medicines and monitoring the patient's vital signs and mental status throughout from the start of the procedure until the patient was taken to the recovery room. The right arm was prepped and draped in the standard fashion for a percutaneous access intervention.  Under ultrasound guidance, the right brachiocephalic arteriovenous fistula was cannulated with a micropuncture needle under direct ultrasound guidance and a permanent image was performed.  The microwire was advanced into the fistula and the needle was exchanged for the a microsheath.  I then upsized to a 6 Fr Sheath and imaging was performed.  Hand injections were completed to image the access including the central venous system. This demonstrated 90-95% stenosis of the cephalic vein 1-2 cm before the cephalic vein subclavian vein confluence.  The remainder of the central venous circulation appeared patent.  There was some narrowing around the sheath at the access site, although that did not appear as stenotic on ultrasound during access and it was unclear how much of that was having a large sheath fistula.  The arterial anastomosis itself appeared widely patent.  Based on the images, this patient will need intervention to this area to avoid thrombosis of the fistula. I then gave the patient 3000 units of intravenous heparin.  I then crossed the stenosis with a Magic Tourqe wire.  Based on the imaging, a 7 mm x  6 cm Lutonix drug-coated angioplasty balloon was selected.  The balloon was centered around the cephalic vein stenosis just before its confluence with the subclavian vein and inflated to 10 ATM for  1 minute(s).  The balloon was removed and imaging was performed demonstrating greater than 50% residual stenosis as well as extravasation from the cephalic vein.  We quickly upsized to a 7 [place] sheath and a 0.018 wire was exchanged for.  An 8 mm diameter by 5 cm length Viabahn stent was then deployed in the cephalic vein with its most proximal tip at the cephalic vein subclavian vein confluence.  This was postdilated with a 7 mm balloon on completion imaging, a 10-15 % residual stenosis was present.  With having upsized to a larger sheath no attempts at clipping the sheath or treating any possible narrowing around the sheath was planned today and she will follow-up with an ultrasound in the office in several weeks to reassess.   Based on the completion imaging, no further intervention is necessary.  The wire and balloon were removed from the sheath.  A 4-0 Monocryl purse-string suture was sewn around the sheath.  The sheath was removed while tying down the suture.  A sterile bandage was applied to the puncture site.  COMPLICATIONS: None  CONDITION: Stable   [name]  01/26/2018 10:49 AM   This note was created with Dragon Medical transcription system. Any errors in dictation are purely unintentional.

## 2018-01-27 DIAGNOSIS — R51 Headache: Secondary | ICD-10-CM

## 2018-01-27 LAB — BASIC METABOLIC PANEL
Anion gap: 10 (ref 5–15)
BUN: 68 mg/dL — AB (ref 6–20)
CHLORIDE: 101 mmol/L (ref 101–111)
CO2: 27 mmol/L (ref 22–32)
CREATININE: 6.28 mg/dL — AB (ref 0.44–1.00)
Calcium: 8.7 mg/dL — ABNORMAL LOW (ref 8.9–10.3)
GFR calc Af Amer: 7 mL/min — ABNORMAL LOW (ref >60)
GFR calc non Af Amer: 6 mL/min — ABNORMAL LOW (ref >60)
GLUCOSE: 107 mg/dL — AB (ref 65–99)
POTASSIUM: 4.7 mmol/L (ref 3.5–5.1)
SODIUM: 138 mmol/L (ref 135–145)

## 2018-01-27 LAB — GLUCOSE, CAPILLARY
GLUCOSE-CAPILLARY: 105 mg/dL — AB (ref 65–99)
Glucose-Capillary: 175 mg/dL — ABNORMAL HIGH (ref 65–99)
Glucose-Capillary: 86 mg/dL (ref 65–99)

## 2018-01-27 LAB — MRSA PCR SCREENING: MRSA by PCR: NEGATIVE

## 2018-01-27 LAB — CBC
HCT: 37.2 % (ref 35.0–47.0)
Hemoglobin: 12.3 g/dL (ref 12.0–16.0)
MCH: 32.2 pg (ref 26.0–34.0)
MCHC: 33.2 g/dL (ref 32.0–36.0)
MCV: 97.2 fL (ref 80.0–100.0)
PLATELETS: 185 10*3/uL (ref 150–440)
RBC: 3.82 MIL/uL (ref 3.80–5.20)
RDW: 16.2 % — AB (ref 11.5–14.5)
WBC: 5.8 10*3/uL (ref 3.6–11.0)

## 2018-01-27 MED ORDER — AMLODIPINE BESYLATE 10 MG PO TABS: 10.0000 mg | ORAL_TABLET | Freq: Every day | ORAL | 0 refills | Status: AC

## 2018-01-27 MED ORDER — LIDOCAINE-PRILOCAINE 2.5-2.5 % EX CREA
1.0000 "application " | TOPICAL_CREAM | CUTANEOUS | Status: DC | PRN
  Filled 2018-01-27: qty 5

## 2018-01-27 MED ORDER — SODIUM CHLORIDE 0.9 % IV SOLN: 100.0000 mL | INTRAVENOUS | Status: DC | PRN

## 2018-01-27 MED ORDER — HEPARIN SODIUM (PORCINE) 1000 UNIT/ML DIALYSIS
1000.0000 [IU] | INTRAMUSCULAR | Status: DC | PRN
  Filled 2018-01-27: qty 1

## 2018-01-27 MED ORDER — LOSARTAN POTASSIUM 50 MG PO TABS: 50.0000 mg | ORAL_TABLET | Freq: Every day | ORAL | 0 refills | Status: DC

## 2018-01-27 MED ORDER — HYDRALAZINE HCL 50 MG PO TABS: 50.0000 mg | ORAL_TABLET | Freq: Three times a day (TID) | ORAL | 0 refills | Status: DC

## 2018-01-27 NOTE — Progress Notes (Signed)
Pt up for d/c per MD order. However pt's bp is elevated last bp 170/63. Dr. Bridgett Larsson notified per Dr. Bridgett Larsson hold discharge and monitor B/P. Will continue to assess

## 2018-01-27 NOTE — Progress Notes (Signed)
Anita Contreras to be D/C'd Home per MD order.  Discussed prescriptions and follow up appointments with the patient and daughter. Prescriptions given to patient, medication list explained in detail. Pt verbalized understanding.  Allergies as of 01/27/2018      Reactions   Propofol Itching   Pt says " when getting anesthesia by body itches" unsure what type of anesthesia      Medication List    TAKE these medications   acetaminophen 500 MG tablet Commonly known as:  TYLENOL Take 1,000 mg by mouth every 6 (six) hours as needed for mild pain or headache.   amLODipine 10 MG tablet Commonly known as:  NORVASC Take 1 tablet (10 mg total) by mouth daily.   aspirin 81 MG EC tablet TAKE 1 TABLET BY MOUTH EVERY DAY   atorvastatin 40 MG tablet Commonly known as:  LIPITOR Take 40 mg by mouth daily at 6 PM.   calcium acetate 667 MG capsule Commonly known as:  PHOSLO Take 1 capsule (667 mg total) by mouth 3 (three) times daily with meals.   docusate sodium 100 MG capsule Commonly known as:  COLACE Take 100 mg by mouth 2 (two) times daily.   feeding supplement (NEPRO CARB STEADY) Liqd Take 237 mLs by mouth 2 (two) times daily between meals.   furosemide 20 MG tablet Commonly known as:  LASIX Take 20 mg by mouth daily.   hydrALAZINE 50 MG tablet Commonly known as:  APRESOLINE Take 1 tablet (50 mg total) by mouth 3 (three) times daily. What changed:    medication strength  how much to take   hydrOXYzine 25 MG tablet Commonly known as:  ATARAX/VISTARIL Take 25 mg by mouth 2 (two) times daily as needed for itching.   insulin NPH-regular Human (70-30) 100 UNIT/ML injection Commonly known as:  NOVOLIN 70/30 Inject 6 Units into the skin 2 (two) times daily with a meal.   lidocaine-prilocaine cream Commonly known as:  EMLA Apply 1 application topically as needed.   losartan 50 MG tablet Commonly known as:  COZAAR Take 1 tablet (50 mg total) by mouth daily.   sodium  bicarbonate 650 MG tablet Take 1,300 mg by mouth 2 (two) times daily.       Vitals:   01/27/18 1826 01/27/18 1851  BP: (!) 170/63 (!) 149/54  Pulse: 82 84  Resp: 18 18  Temp:    SpO2:  96%    Tele box removed and returned.Skin clean, dry and intact without evidence of skin break down, no evidence of skin tears noted. IV catheter discontinued intact. Site without signs and symptoms of complications. Dressing and pressure applied. Pt denies pain at this time. No complaints noted.  An After Visit Summary was printed and given to the patient. Patient escorted via [place], and D/C home via private auto.  Rolley Sims

## 2018-01-27 NOTE — Progress Notes (Signed)
Pre hd 

## 2018-01-27 NOTE — Discharge Summary (Signed)
[facility] NAME: Anita Contreras    MR#:  [id number]  DATE OF BIRTH:  22-Jul-1956  DATE OF ADMISSION:  01/26/2018 ADMITTING PHYSICIAN: Algernon Huxley, MD  DATE OF DISCHARGE: 01/27/2018  PRIMARY CARE PHYSICIAN: Arlis Porta, FNP    ADMISSION DIAGNOSIS:  RT upper extremity shuntogram    End Stage Renal Spanish Interpreter Requested  DISCHARGE DIAGNOSIS:  Active Problems:   HTN (hypertension)   SECONDARY DIAGNOSIS:   Past Medical History:  Diagnosis Date  . Acute cystitis with hematuria   . Anemia   . Cataracts, bilateral   . CKD (chronic kidney disease)   . Diabetic retinopathy, background ([place])   . Dialysis patient ([place])    Tues, Thurs, Sat  . Dizziness   . Hyperlipidemia   . Hypertension   . Migraine variant   . Neuropathy   . OP (osteoporosis)   . Personal history of noncompliance with medical treatment, presenting hazards to health   . Steal syndrome as complication of dialysis access ([place])   . Stroke ([place])   . Type II diabetes mellitus with ophthalmic manifestations, uncontrolled ([place])   . Vitamin D deficiency     HOSPITAL COURSE:   62 year female with end-stage renal disease on hemodialysis, essential hypertension and diabetes who presented to specials today for cannulation of the AV fistula who then had elevated blood pressure.  1. Accelerated essential hypertension with headache and vision changes Her symptoms of headache and vision changes are due to accelerated essential blood pressure. Her blood pressures better controlled. She will continue outpatient medications with some adjustments made to hydralazine. She will need follow-up with PCP regarding her blood pressure.   2. Headache: This is related to accelerated essential hypertension CT head was negative for acute CVA or ice each. . Not better blood pressure is improved she is not complaining of headache.  If she continues to have  headaches as outpatient she may benefit from outpatient neurology evaluation.    3. End-stage renal disease on hemodialysis: Patient has functioning AV fistula.. She had dialysis prior to discharge She will resume dialysis Tuesday, Thursday and Saturday. 4. Diabetes: We'll continue outpatient regimen ADA diet    DISCHARGE CONDITIONS AND DIET:    Discharge on renal/diabetic diet  CONSULTS OBTAINED:  Treatment Team:  Murlean Iba, MD Alexis Goodell, MD  DRUG ALLERGIES:   Allergies  Allergen Reactions  . Propofol Itching    Pt says " when getting anesthesia by body itches" unsure what type of anesthesia    DISCHARGE MEDICATIONS:   Allergies as of 01/27/2018      Reactions   Propofol Itching   Pt says " when getting anesthesia by body itches" unsure what type of anesthesia      Medication List    TAKE these medications   acetaminophen 500 MG tablet Commonly known as:  TYLENOL Take 1,000 mg by mouth every 6 (six) hours as needed for mild pain or headache.   amLODipine 10 MG tablet Commonly known as:  NORVASC Take 1 tablet (10 mg total) by mouth daily.   aspirin 81 MG EC tablet TAKE 1 TABLET BY MOUTH EVERY DAY   atorvastatin 40 MG tablet Commonly known as:  LIPITOR Take 40 mg by mouth daily at 6 PM.   calcium acetate 667 MG capsule Commonly known as:  PHOSLO Take 1 capsule (667 mg total) by mouth 3 (three) times daily with meals.   docusate sodium 100  MG capsule Commonly known as:  COLACE Take 100 mg by mouth 2 (two) times daily.   feeding supplement (NEPRO CARB STEADY) Liqd Take 237 mLs by mouth 2 (two) times daily between meals.   furosemide 20 MG tablet Commonly known as:  LASIX Take 20 mg by mouth daily.   hydrALAZINE 50 MG tablet Commonly known as:  APRESOLINE Take 1 tablet (50 mg total) by mouth 3 (three) times daily. What changed:    medication strength  how much to take   hydrOXYzine 25 MG tablet Commonly known as:   ATARAX/VISTARIL Take 25 mg by mouth 2 (two) times daily as needed for itching.   insulin NPH-regular Human (70-30) 100 UNIT/ML injection Commonly known as:  NOVOLIN 70/30 Inject 6 Units into the skin 2 (two) times daily with a meal.   lidocaine-prilocaine cream Commonly known as:  EMLA Apply 1 application topically as needed.   losartan 50 MG tablet Commonly known as:  COZAAR Take 1 tablet (50 mg total) by mouth daily.   sodium bicarbonate 650 MG tablet Take 1,300 mg by mouth 2 (two) times daily.         Today   CHIEF COMPLAINT:   No headache overnight. Patient doing well   VITAL SIGNS:  Blood pressure (!) 154/58, pulse 78, temperature 98.3 F (36.8 C), temperature source Oral, resp. rate 20, height 4\' 10"  (1.473 m), weight 47.8 kg (105 lb 4.8 oz), last menstrual period 09/19/2001, SpO2 91 %.   REVIEW OF SYSTEMS:  Review of Systems  Constitutional: Negative.  Negative for chills, fever and malaise/fatigue.  HENT: Negative.  Negative for ear discharge, ear pain, hearing loss, nosebleeds and sore throat.   Eyes: Negative.  Negative for blurred vision and pain.  Respiratory: Negative.  Negative for cough, hemoptysis, shortness of breath and wheezing.   Cardiovascular: Negative.  Negative for chest pain, palpitations and leg swelling.  Gastrointestinal: Negative.  Negative for abdominal pain, blood in stool, diarrhea, nausea and vomiting.  Genitourinary: Negative.  Negative for dysuria.  Musculoskeletal: Negative.  Negative for back pain.  Skin: Negative.   Neurological: Negative for dizziness, tremors, speech change, focal weakness, seizures and headaches.  Endo/Heme/Allergies: Negative.  Does not bruise/bleed easily.  Psychiatric/Behavioral: Negative.  Negative for depression, hallucinations and suicidal ideas.     PHYSICAL EXAMINATION:  GENERAL:  62 y.o.-year-old patient lying in the bed with no acute distress.  NECK:  Supple, no jugular venous distention. No  thyroid enlargement, no tenderness.  LUNGS: Normal breath sounds bilaterally, no wheezing, rales,rhonchi  No use of accessory muscles of respiration.  CARDIOVASCULAR: S1, S2 normal. No murmurs, rubs, or gallops.  ABDOMEN: Soft, non-tender, non-distended. Bowel sounds present. No organomegaly or mass.  EXTREMITIES: No pedal edema, cyanosis, or clubbing.  PSYCHIATRIC: The patient is alert and oriented x 3.  SKIN: No obvious rash, lesion, or ulcer.   DATA REVIEW:   CBC Recent Labs  Lab 01/27/18 0421  WBC 5.8  HGB 12.3  HCT 37.2  PLT 185    Chemistries  Recent Labs  Lab 01/27/18 0421  NA 138  K 4.7  CL 101  CO2 27  GLUCOSE 107*  BUN 68*  CREATININE 6.28*  CALCIUM 8.7*    Cardiac Enzymes No results for input(s): TROPONINI in the last 168 hours.  Microbiology Results  @[email or web address] @  RADIOLOGY:  Ct Head Wo Contrast  Result Date: 01/26/2018 CLINICAL DATA:  History of end-stage renal disease on hemodialysis, essential hypertension and diabetes who presented today to specials  for angioplasty of cephalic vein due to poorly functional right brachiocephalic AV fistula. Significant headache and blurred vision. Elevated systolic blood pressure. EXAM: CT HEAD WITHOUT CONTRAST TECHNIQUE: Contiguous axial images were obtained from the base of the skull through the vertex without intravenous contrast. COMPARISON:  12/23/2017 FINDINGS: Brain: There is mild central and cortical atrophy. There is no intra or extra-axial fluid collection or mass lesion. The basilar cisterns and ventricles have a normal appearance. There is no CT evidence for acute infarction or hemorrhage. Vascular: No hyperdense vessel or unexpected calcification. Skull: Normal. Negative for fracture or focal lesion. Sinuses/Orbits: There is mucoperiosteal thickening of the ethmoid and frontal air cells. Other: None IMPRESSION: 1.  No evidence for acute  abnormality. 2. Mild atrophy. 3. Sinus disease. Electronically Signed   By:  Nolon Nations M.D.   On: 01/26/2018 13:10      Allergies as of 01/27/2018      Reactions   Propofol Itching   Pt says " when getting anesthesia by body itches" unsure what type of anesthesia      Medication List    TAKE these medications   acetaminophen 500 MG tablet Commonly known as:  TYLENOL Take 1,000 mg by mouth every 6 (six) hours as needed for mild pain or headache.   amLODipine 10 MG tablet Commonly known as:  NORVASC Take 1 tablet (10 mg total) by mouth daily.   aspirin 81 MG EC tablet TAKE 1 TABLET BY MOUTH EVERY DAY   atorvastatin 40 MG tablet Commonly known as:  LIPITOR Take 40 mg by mouth daily at 6 PM.   calcium acetate 667 MG capsule Commonly known as:  PHOSLO Take 1 capsule (667 mg total) by mouth 3 (three) times daily with meals.   docusate sodium 100 MG capsule Commonly known as:  COLACE Take 100 mg by mouth 2 (two) times daily.   feeding supplement (NEPRO CARB STEADY) Liqd Take 237 mLs by mouth 2 (two) times daily between meals.   furosemide 20 MG tablet Commonly known as:  LASIX Take 20 mg by mouth daily.   hydrALAZINE 50 MG tablet Commonly known as:  APRESOLINE Take 1 tablet (50 mg total) by mouth 3 (three) times daily. What changed:    medication strength  how much to take   hydrOXYzine 25 MG tablet Commonly known as:  ATARAX/VISTARIL Take 25 mg by mouth 2 (two) times daily as needed for itching.   insulin NPH-regular Human (70-30) 100 UNIT/ML injection Commonly known as:  NOVOLIN 70/30 Inject 6 Units into the skin 2 (two) times daily with a meal.   lidocaine-prilocaine cream Commonly known as:  EMLA Apply 1 application topically as needed.   losartan 50 MG tablet Commonly known as:  COZAAR Take 1 tablet (50 mg total) by mouth daily.   sodium bicarbonate 650 MG tablet Take 1,300 mg by mouth 2 (two) times daily.           Management plans discussed with the patient and she is in agreement. Stable for discharge  home  Patient should follow up with pcp  CODE STATUS:     Code Status Orders  (From admission, onward)        Start     Ordered   01/26/18 1533  Full code  Continuous     01/26/18 1532    Code Status History    Date Active Date Inactive Code Status Order ID Comments User Context   12/24/2017 01:31 12/25/2017 20:58 Full Code [id number]  Lance Coon, MD Inpatient   01/14/2017 09:25 01/17/2017 18:03 Full Code [id number]  Laverle Hobby, MD ED   11/08/2016 18:08 11/10/2016 17:54 Full Code [id number]  Algernon Huxley, MD Inpatient   10/17/2016 13:19 10/17/2016 19:24 Full Code [id number]  Algernon Huxley, MD Inpatient   04/23/2016 20:29 05/07/2016 20:01 Full Code [id number]  Vaughan Basta, MD Inpatient   04/15/2016 19:18 04/19/2016 16:08 Full Code [id number]  Gladstone Lighter, MD Inpatient      TOTAL TIME TAKING CARE OF THIS PATIENT: 38 minutes.    Note: This dictation was prepared with Dragon dictation along with smaller phrase technology. Any transcriptional errors that result from this process are unintentional.  Aaralynn Shepheard M.D on 01/27/2018 at 10:18 AM  Between 7am to 6pm - Pager - [phone] After 6pm go to [email or web address] - password [email or web address] [facility]  Office  [phone]  CC: Primary care physician; Arlis Porta, FNP

## 2018-01-27 NOTE — Care Management (Addendum)
Patient admitted from specials for hypertension after procedure to declot fistula.  CM consult for home health needs.  Informed by members of care team there are no home health need concerns. Confirmed that patient is current with care provider at [facility] but it is known and documented there are issues with compliance.  Her care providers at the clinic is now [facility].  Post discharge appointment has been scheduled.  Notified [name] with [facility] of discharge

## 2018-01-27 NOTE — Progress Notes (Signed)
[place] rounding on unit. Alden will provide follow-up once patient is back in room. CH provided silent prayer.

## 2018-01-27 NOTE — Progress Notes (Signed)
HD initiated via L AVF without issue using 15g needles x2. Patent. No heparin treatment. 3K bath. Continue to monitor patient.

## 2018-01-27 NOTE — Progress Notes (Signed)
[facility], [place] 01/27/18  Subjective:   Conversation through [place] interpreter She presented as outpatient for Access delot; after her procedure, patient was admitted due to uncontrolled BP and vomiting BP is better controlled today   Objective:  Vital signs in last 24 hours:  Temp:  [98.2 F (36.8 C)-98.9 F (37.2 C)] 98.3 F (36.8 C) (02/19 0816) Pulse Rate:  [70-78] 78 (02/19 0816) Resp:  [17-20] 20 (02/19 0816) BP: (127-192)/(50-74) 154/58 (02/19 0816) SpO2:  [90 %-97 %] 91 % (02/19 0816) Weight:  [47.8 kg (105 lb 4.8 oz)] 47.8 kg (105 lb 4.8 oz) (02/19 0341)  Weight change:  Filed Weights   01/26/18 1000 01/27/18 0341  Weight: 46.7 kg (103 lb) 47.8 kg (105 lb 4.8 oz)    Intake/Output:    Intake/Output Summary (Last 24 hours) at 01/27/2018 1007 Last data filed at 01/27/2018 0700 Gross per 24 hour  Intake 240 ml  Output 0 ml  Net 240 ml     Physical Exam: General: Laying in bed, NAD  HEENT Anicteric   Neck supple  Pulm/lungs Clear to auscultation  CVS/Heart Regular, no rub  Abdomen:  Soft, NT  Extremities: No edema  Neurologic: Alert, oriented  Skin: No acute rashes  Access: Rt upper arm AVF       Basic Metabolic Panel:  Recent Labs  Lab 01/26/18 1541 01/27/18 0421  NA  --  138  K  --  4.7  CL  --  101  CO2  --  27  GLUCOSE  --  107*  BUN  --  68*  CREATININE 5.72* 6.28*  CALCIUM  --  8.7*     CBC: Recent Labs  Lab 01/26/18 1541 01/27/18 0421  WBC 6.2 5.8  HGB 12.4 12.3  HCT 37.3 37.2  MCV 96.4 97.2  PLT 176 185      Lab Results  Component Value Date   HEPBSAG Negative 12/25/2017   HEPBSAB Non Reactive 01/14/2017   HEPBIGM Negative 04/16/2016      Microbiology:  Recent Results (from the past 240 hour(s))  MRSA PCR Screening     Status: None   Collection Time: 01/26/18 11:27 PM  Result Value Ref Range Status   MRSA by PCR NEGATIVE NEGATIVE Final    Comment:        The GeneXpert MRSA  Assay (FDA approved for NASAL specimens only), is one component of a comprehensive MRSA colonization surveillance program. It is not intended to diagnose MRSA infection nor to guide or monitor treatment for MRSA infections. Performed at [facility], [address] [place]ita Contreras,  [id number]     Coagulation Studies: No results for input(s): LABPROT, INR in the last 72 hours.  Urinalysis: No results for input(s): COLORURINE, LABSPEC, PHURINE, GLUCOSEU, HGBUR, BILIRUBINUR, KETONESUR, PROTEINUR, UROBILINOGEN, NITRITE, LEUKOCYTESUR in the last 72 hours.  Invalid input(s): APPERANCEUR    Imaging: Ct Head Wo Contrast  Result Date: 01/26/2018 CLINICAL DATA:  History of end-stage renal disease on hemodialysis, essential hypertension and diabetes who presented today to specials for angioplasty of cephalic vein due to poorly functional right brachiocephalic AV fistula. Significant headache and blurred vision. Elevated systolic blood pressure. EXAM: CT HEAD WITHOUT CONTRAST TECHNIQUE: Contiguous axial images were obtained from the base of the skull through the vertex without intravenous contrast. COMPARISON:  12/23/2017 FINDINGS: Brain: There is mild central and cortical atrophy. There is no intra or extra-axial fluid collection or mass lesion. The basilar cisterns and ventricles have a  normal appearance. There is no CT evidence for acute infarction or hemorrhage. Vascular: No hyperdense vessel or unexpected calcification. Skull: Normal. Negative for fracture or focal lesion. Sinuses/Orbits: There is mucoperiosteal thickening of the ethmoid and frontal air cells. Other: None IMPRESSION: 1.  No evidence for acute  abnormality. 2. Mild atrophy. 3. Sinus disease. Electronically Signed   By: Nolon Nations M.D.   On: 01/26/2018 13:10     Medications:   . sodium chloride     . amLODipine  10 mg Oral Daily  . aspirin EC  81 mg Oral Daily  . atorvastatin  40 mg Oral q1800  . calcium  acetate  667 mg Oral TID WC  . docusate sodium  100 mg Oral BID  . feeding supplement (NEPRO CARB STEADY)  237 mL Oral BID BM  . furosemide  20 mg Oral Daily  . heparin  5,000 Units Subcutaneous Q8H  . hydrALAZINE  25 mg Oral TID  . insulin aspart  0-9 Units Subcutaneous TID WC  . insulin aspart protamine- aspart  6 Units Subcutaneous BID WC  . losartan  50 mg Oral Daily  . sodium bicarbonate  1,300 mg Oral BID  . sodium chloride flush  3 mL Intravenous Q12H   sodium chloride, acetaminophen **OR** acetaminophen, acetaminophen, hydrALAZINE, HYDROmorphone (DILAUDID) injection, hydrOXYzine, ondansetron (ZOFRAN) IV, ondansetron **OR** ondansetron (ZOFRAN) IV, polyethylene glycol, SUMAtriptan, traMADol  Assessment/ Plan:  62 y.o. female with end-stage renal disease, diabetes with retinopathy, neuropathy admitted for evaluation and management of pneumonia  Anita Contreras [facility] nephrology/TTS am shift  1.  End-stage renal disease 2.  Anemia of chronic kidney disease 3.  Secondary hyperparathyroidism 4. Complication of dialysis access  Patient underwent declot of her AVF 2/18. Admitted for Headache and high BP Home meds restarted. BP control is better Patient is expected to discharge after HD today     LOS: Anita Contreras 2/19/201910:07 Anita Contreras, [place]  Note: This note was prepared with Dragon dictation. Any transcription errors are unintentional

## 2018-01-27 NOTE — Progress Notes (Signed)
Pt bp now 149/54 pulse 78. Per Dr. Bridgett Larsson okay to discharge patient home.

## 2018-01-27 NOTE — Progress Notes (Signed)
HD completed without issue. UF goal 1L met. Patient tolerated well.

## 2018-01-27 NOTE — Progress Notes (Signed)
Post hd assessment unchanged  

## 2018-01-27 NOTE — Consult Note (Signed)
Reason for Consult:Headache, vision changes Referring Physician: Mody  CC: Headache, vision changes  HPI: Anita Contreras is an 62 y.o. female with multiple medical problems who reports complaints of visual changes and headaches.  Patient reports that over a year ago she lost vision in her left eye due to what she was told was a burst blood vessel.  Reports vision in her right eye was poor but she had laser surgery a little over a month ago and it has improved to some degree.  There has been no recent worsening in vision. Patient also reports headaches daily for the past month.  Reports they are frontal and sharp at a severity of 10/10.  They occur when she awakens at night and with dialysis.  They have been recieved with Tylenol but she is requiring multiple doses daily.  Headaches are at their worst when her BP is elevated.  Her compliance with medications is unclear.    Past Medical History:  Diagnosis Date  . Acute cystitis with hematuria   . Anemia   . Cataracts, bilateral   . CKD (chronic kidney disease)   . Diabetic retinopathy, background ([place])   . Dialysis patient ([place])    Tues, Thurs, Sat  . Dizziness   . Hyperlipidemia   . Hypertension   . Migraine variant   . Neuropathy   . OP (osteoporosis)   . Personal history of noncompliance with medical treatment, presenting hazards to health   . Steal syndrome as complication of dialysis access ([place])   . Stroke ([place])   . Type II diabetes mellitus with ophthalmic manifestations, uncontrolled ([place])   . Vitamin D deficiency     Past Surgical History:  Procedure Laterality Date  . A/V SHUNT INTERVENTION N/A 01/26/2018   Procedure: A/V SHUNT INTERVENTION;  Surgeon: Algernon Huxley, MD;  Location: [facility];  Service: Cardiovascular;  Laterality: N/A;  . A/V SHUNTOGRAM Right 01/26/2018   Procedure: A/V SHUNTOGRAM;  Surgeon: Algernon Huxley, MD;  Location: [facility];  Service: Cardiovascular;  Laterality: Right;   . AV FISTULA PLACEMENT Right 10/10/2016   Procedure: ARTERIOVENOUS (AV) FISTULA CREATION ( BRACHIOCEPHALIC );  Surgeon: Algernon Huxley, MD;  Location: [facility];  Service: Vascular;  Laterality: Right;  . DILATION AND CURETTAGE OF UTERUS    . DISTAL REVASCULARIZATION AND INTERVAL LIGATION (DRIL) Right 11/08/2016   Procedure: DISTAL REVASCULARIZATION AND INTERVAL LIGATION (DRIL);  Surgeon: Algernon Huxley, MD;  Location: [facility];  Service: Vascular;  Laterality: Right;  . PERIPHERAL VASCULAR CATHETERIZATION N/A 04/25/2016   Procedure: Dialysis/Perma Catheter Insertion;  Surgeon: Algernon Huxley, MD;  Location: [facility];  Service: Cardiovascular;  Laterality: N/A;  . PERIPHERAL VASCULAR CATHETERIZATION Right 10/17/2016   Procedure: Upper Extremity Angiography;  Surgeon: Algernon Huxley, MD;  Location: [facility];  Service: Cardiovascular;  Laterality: Right;    Family History  Problem Relation Age of Onset  . Hypertension Mother   . Hyperlipidemia Mother     Social History:  reports that  has never smoked. she has never used smokeless tobacco. She reports that she does not drink alcohol or use drugs.  Allergies  Allergen Reactions  . Propofol Itching    Pt says " when getting anesthesia by body itches" unsure what type of anesthesia    Medications:  I have reviewed the patient's current medications. Prior to Admission:  Medications Prior to Admission  Medication Sig Dispense Refill Last Dose  . lidocaine-prilocaine (  EMLA) cream Apply 1 application topically as needed.   PRN at PRN  . acetaminophen (TYLENOL) 500 MG tablet Take 1,000 mg by mouth every 6 (six) hours as needed for mild pain or headache.     Marland Kitchen aspirin 81 MG EC tablet TAKE 1 TABLET BY MOUTH EVERY DAY 150 tablet 0   . atorvastatin (LIPITOR) 40 MG tablet Take 40 mg by mouth daily at 6 PM.      . calcium acetate (PHOSLO) 667 MG capsule Take 1 capsule (667 mg total) by mouth 3 (three) times daily with meals. 90 capsule 0  12/24/2017 at Unknown time  . docusate sodium (COLACE) 100 MG capsule Take 100 mg by mouth 2 (two) times daily.   PRN at PRN  . furosemide (LASIX) 20 MG tablet Take 20 mg by mouth daily.   12/24/2017 at Unknown time  . hydrOXYzine (ATARAX/VISTARIL) 25 MG tablet Take 25 mg by mouth 2 (two) times daily as needed for itching.   PRN at PRN  . insulin NPH-regular Human (NOVOLIN 70/30) (70-30) 100 UNIT/ML injection Inject 6 Units into the skin 2 (two) times daily with a meal.    12/24/2017 at Unknown time  . Nutritional Supplements (FEEDING SUPPLEMENT, NEPRO CARB STEADY,) LIQD Take 237 mLs by mouth 2 (two) times daily between meals. 60 Can 0 12/24/2017 at Unknown time  . sodium bicarbonate 650 MG tablet Take 1,300 mg by mouth 2 (two) times daily.   12/24/2017 at Unknown time   Scheduled: . amLODipine  10 mg Oral Daily  . aspirin EC  81 mg Oral Daily  . atorvastatin  40 mg Oral q1800  . calcium acetate  667 mg Oral TID WC  . docusate sodium  100 mg Oral BID  . feeding supplement (NEPRO CARB STEADY)  237 mL Oral BID BM  . furosemide  20 mg Oral Daily  . heparin  5,000 Units Subcutaneous Q8H  . hydrALAZINE  25 mg Oral TID  . insulin aspart  0-9 Units Subcutaneous TID WC  . insulin aspart protamine- aspart  6 Units Subcutaneous BID WC  . losartan  50 mg Oral Daily  . sodium bicarbonate  1,300 mg Oral BID  . sodium chloride flush  3 mL Intravenous Q12H    ROS: History obtained from the patient  General ROS: negative for - chills, fatigue, fever, night sweats, weight gain or weight loss Psychological ROS: negative for - behavioral disorder, hallucinations, memory difficulties, mood swings or suicidal ideation Ophthalmic ROS: poor vision ENT ROS: negative for - epistaxis, nasal discharge, oral lesions, sore throat, tinnitus or vertigo Allergy and Immunology ROS: negative for - hives or itchy/watery eyes Hematological and Lymphatic ROS: negative for - bleeding problems, bruising or swollen lymph  nodes Endocrine ROS: negative for - galactorrhea, hair pattern changes, polydipsia/polyuria or temperature intolerance Respiratory ROS: negative for - cough, hemoptysis, shortness of breath or wheezing Cardiovascular ROS: negative for - chest pain, dyspnea on exertion, edema or irregular heartbeat Gastrointestinal ROS: nausea/vomiting Genito-Urinary ROS: negative for - dysuria, hematuria, incontinence or urinary frequency/urgency Musculoskeletal ROS: negative for - joint swelling or muscular weakness Neurological ROS: as noted in HPI Dermatological ROS: negative for rash and skin lesion changes  Physical Examination: Blood pressure (!) 154/58, pulse 78, temperature 98.3 F (36.8 C), temperature source Oral, resp. rate 20, height 4\' 10"  (1.473 m), weight 47.8 kg (105 lb 4.8 oz), last menstrual period 09/19/2001, SpO2 91 %.  HEENT-  Normocephalic, no lesions, without obvious abnormality.  Normal external  eye and conjunctiva.  Normal TM's bilaterally.  Normal auditory canals and external ears. Normal external nose, mucus membranes and septum.  Normal pharynx. Cardiovascular- S1, S2 normal, pulses palpable throughout   Lungs- chest clear, no wheezing, rales, normal symmetric air entry Abdomen- soft, non-tender; bowel sounds normal; no masses,  no organomegaly Extremities- no edema Lymph-no adenopathy palpable Musculoskeletal-no joint tenderness, deformity or swelling Skin-warm and dry, no hyperpigmentation, vitiligo, or suspicious lesions  Neurological Examination   Mental Status: Alert, oriented, thought content appropriate.  Speech fluent without evidence of aphasia.  Able to follow 3 step commands without difficulty. Cranial Nerves: II: Unable to see from the left eye. Able to count fingers from the right eye.   III,IV, VI: ptosis not present, extra-ocular motions intact bilaterally V,VII: smile symmetric, facial light touch sensation normal bilaterally VIII: hearing normal  bilaterally IX,X: gag reflex present XI: bilateral shoulder shrug XII: midline tongue extension Motor: Right : Upper extremity   5/5    Left:     Upper extremity   5/5  Lower extremity   5/5     Lower extremity   5/5 Tone and bulk:normal tone throughout; no atrophy noted Sensory: Pinprick and light touch intact throughout, bilaterally Deep Tendon Reflexes: 2+ in the upper extremities and absent in the lower extremities Plantars: Right: mute   Left: mute Cerebellar: Normal finger-to-nose and normal heel-to-shin testing bilaterally Gait: normal gait and station    Laboratory Studies:   Basic Metabolic Panel: Recent Labs  Lab 01/26/18 1541 01/27/18 0421  NA  --  138  K  --  4.7  CL  --  101  CO2  --  27  GLUCOSE  --  107*  BUN  --  68*  CREATININE 5.72* 6.28*  CALCIUM  --  8.7*    Liver Function Tests: No results for input(s): AST, ALT, ALKPHOS, BILITOT, PROT, ALBUMIN in the last 168 hours. No results for input(s): LIPASE, AMYLASE in the last 168 hours. No results for input(s): AMMONIA in the last 168 hours.  CBC: Recent Labs  Lab 01/26/18 1541 01/27/18 0421  WBC 6.2 5.8  HGB 12.4 12.3  HCT 37.3 37.2  MCV 96.4 97.2  PLT 176 185    Cardiac Enzymes: No results for input(s): CKTOTAL, CKMB, CKMBINDEX, TROPONINI in the last 168 hours.  BNP: Invalid input(s): POCBNP  CBG: Recent Labs  Lab 01/26/18 0943 01/26/18 1708 01/26/18 2119 01/27/18 0737  GLUCAP 168* 142* 134* 105*    Microbiology: Results for orders placed or performed during the hospital encounter of 01/26/18  MRSA PCR Screening     Status: None   Collection Time: 01/26/18 11:27 PM  Result Value Ref Range Status   MRSA by PCR NEGATIVE NEGATIVE Final    Comment:        The GeneXpert MRSA Assay (FDA approved for NASAL specimens only), is one component of a comprehensive MRSA colonization surveillance program. It is not intended to diagnose MRSA infection nor to guide or monitor treatment  for MRSA infections. Performed at [facility], [address] [place], [place] [address]     Coagulation Studies: No results for input(s): LABPROT, INR in the last 72 hours.  Urinalysis: No results for input(s): COLORURINE, LABSPEC, PHURINE, GLUCOSEU, HGBUR, BILIRUBINUR, KETONESUR, PROTEINUR, UROBILINOGEN, NITRITE, LEUKOCYTESUR in the last 168 hours.  Invalid input(s): APPERANCEUR  Lipid Panel:     Component Value Date/Time   CHOL 124 04/18/2016 0339   TRIG 162 (H) 04/18/2016 0339   HDL  41 04/18/2016 0339   CHOLHDL 3.0 04/18/2016 0339   VLDL 32 04/18/2016 0339   LDLCALC 51 04/18/2016 0339    HgbA1C:  Lab Results  Component Value Date   HGBA1C 7.2 (H) 04/15/2016    Urine Drug Screen:  No results found for: LABOPIA, COCAINSCRNUR, LABBENZ, AMPHETMU, THCU, LABBARB  Alcohol Level: No results for input(s): ETH in the last 168 hours.   Imaging: Ct Head Wo Contrast  Result Date: 01/26/2018 CLINICAL DATA:  History of end-stage renal disease on hemodialysis, essential hypertension and diabetes who presented today to specials for angioplasty of cephalic vein due to poorly functional right brachiocephalic AV fistula. Significant headache and blurred vision. Elevated systolic blood pressure. EXAM: CT HEAD WITHOUT CONTRAST TECHNIQUE: Contiguous axial images were obtained from the base of the skull through the vertex without intravenous contrast. COMPARISON:  12/23/2017 FINDINGS: Brain: There is mild central and cortical atrophy. There is no intra or extra-axial fluid collection or mass lesion. The basilar cisterns and ventricles have a normal appearance. There is no CT evidence for acute infarction or hemorrhage. Vascular: No hyperdense vessel or unexpected calcification. Skull: Normal. Negative for fracture or focal lesion. Sinuses/Orbits: There is mucoperiosteal thickening of the ethmoid and frontal air cells. Other: None IMPRESSION: 1.  No evidence for acute  abnormality. 2.  Mild atrophy. 3. Sinus disease. Electronically Signed   By: Nolon Nations M.D.   On: 01/26/2018 13:10     Assessment/Plan: 62 year old female with multiple medical problems and complaint of headache for the past month.  Neurological examintion is nonfocal.  Head CT reviewed and shows no acute changes.  Sinus disease is evident.  Pain relieved with Tylenol.  There does seem to be some component related to her poorly controlled hypertension and compliance with medications has been stressed.  There may be some component from her sinus disease as well and further treatment may be indicated and can be initiated on an outpatient basis.    No further neurologic intervention is recommended at this time.  If further questions arise, please call or page at that time.  Thank you for allowing neurology to participate in the care of this patient.  Alexis Goodell, MD Neurology [phone] 01/27/2018  12:28 PM

## 2018-02-05 DIAGNOSIS — Z992 Dependence on renal dialysis: Secondary | ICD-10-CM | POA: Insufficient documentation

## 2018-02-24 ENCOUNTER — Inpatient Hospital Stay
Admission: EM | Admit: 2018-02-24 | Discharge: 2018-02-25 | DRG: 640 | Disposition: A | Discharge status: Home / Self Care | Payer: Medicaid Other | Attending: Internal Medicine | Admitting: Internal Medicine

## 2018-02-24 ENCOUNTER — Encounter: Payer: Self-pay | Admitting: Emergency Medicine

## 2018-02-24 ENCOUNTER — Other Ambulatory Visit: Payer: Self-pay

## 2018-02-24 ENCOUNTER — Emergency Department: Payer: Medicaid Other

## 2018-02-24 DIAGNOSIS — E785 Hyperlipidemia, unspecified: Secondary | ICD-10-CM | POA: Diagnosis present

## 2018-02-24 DIAGNOSIS — E559 Vitamin D deficiency, unspecified: Secondary | ICD-10-CM | POA: Diagnosis present

## 2018-02-24 DIAGNOSIS — Z992 Dependence on renal dialysis: Secondary | ICD-10-CM | POA: Diagnosis not present

## 2018-02-24 DIAGNOSIS — Z8249 Family history of ischemic heart disease and other diseases of the circulatory system: Secondary | ICD-10-CM | POA: Diagnosis not present

## 2018-02-24 DIAGNOSIS — E1139 Type 2 diabetes mellitus with other diabetic ophthalmic complication: Secondary | ICD-10-CM | POA: Diagnosis present

## 2018-02-24 DIAGNOSIS — Z884 Allergy status to anesthetic agent status: Secondary | ICD-10-CM | POA: Diagnosis not present

## 2018-02-24 DIAGNOSIS — N186 End stage renal disease: Secondary | ICD-10-CM | POA: Diagnosis present

## 2018-02-24 DIAGNOSIS — D631 Anemia in chronic kidney disease: Secondary | ICD-10-CM | POA: Diagnosis present

## 2018-02-24 DIAGNOSIS — Z8349 Family history of other endocrine, nutritional and metabolic diseases: Secondary | ICD-10-CM

## 2018-02-24 DIAGNOSIS — Z9119 Patient's noncompliance with other medical treatment and regimen: Secondary | ICD-10-CM | POA: Diagnosis not present

## 2018-02-24 DIAGNOSIS — E1122 Type 2 diabetes mellitus with diabetic chronic kidney disease: Secondary | ICD-10-CM | POA: Diagnosis present

## 2018-02-24 DIAGNOSIS — R197 Diarrhea, unspecified: Secondary | ICD-10-CM

## 2018-02-24 DIAGNOSIS — Z7982 Long term (current) use of aspirin: Secondary | ICD-10-CM | POA: Diagnosis not present

## 2018-02-24 DIAGNOSIS — Z794 Long term (current) use of insulin: Secondary | ICD-10-CM | POA: Diagnosis not present

## 2018-02-24 DIAGNOSIS — E11319 Type 2 diabetes mellitus with unspecified diabetic retinopathy without macular edema: Secondary | ICD-10-CM | POA: Diagnosis present

## 2018-02-24 DIAGNOSIS — H269 Unspecified cataract: Secondary | ICD-10-CM | POA: Diagnosis present

## 2018-02-24 DIAGNOSIS — Z8673 Personal history of transient ischemic attack (TIA), and cerebral infarction without residual deficits: Secondary | ICD-10-CM

## 2018-02-24 DIAGNOSIS — Z79899 Other long term (current) drug therapy: Secondary | ICD-10-CM | POA: Diagnosis not present

## 2018-02-24 DIAGNOSIS — Y838 Other surgical procedures as the cause of abnormal reaction of the patient, or of later complication, without mention of misadventure at the time of the procedure: Secondary | ICD-10-CM | POA: Diagnosis present

## 2018-02-24 DIAGNOSIS — E876 Hypokalemia: Secondary | ICD-10-CM | POA: Diagnosis present

## 2018-02-24 DIAGNOSIS — N2581 Secondary hyperparathyroidism of renal origin: Secondary | ICD-10-CM | POA: Diagnosis present

## 2018-02-24 DIAGNOSIS — R112 Nausea with vomiting, unspecified: Secondary | ICD-10-CM | POA: Diagnosis present

## 2018-02-24 DIAGNOSIS — E1142 Type 2 diabetes mellitus with diabetic polyneuropathy: Secondary | ICD-10-CM | POA: Diagnosis present

## 2018-02-24 DIAGNOSIS — E875 Hyperkalemia: Secondary | ICD-10-CM | POA: Diagnosis present

## 2018-02-24 DIAGNOSIS — Z8701 Personal history of pneumonia (recurrent): Secondary | ICD-10-CM

## 2018-02-24 DIAGNOSIS — I120 Hypertensive chronic kidney disease with stage 5 chronic kidney disease or end stage renal disease: Secondary | ICD-10-CM | POA: Diagnosis present

## 2018-02-24 DIAGNOSIS — T82898A Other specified complication of vascular prosthetic devices, implants and grafts, initial encounter: Secondary | ICD-10-CM | POA: Diagnosis present

## 2018-02-24 LAB — CBC
HCT: 37.1 % (ref 35.0–47.0)
Hemoglobin: 12.4 g/dL (ref 12.0–16.0)
MCH: 31.2 pg (ref 26.0–34.0)
MCHC: 33.4 g/dL (ref 32.0–36.0)
MCV: 93.4 fL (ref 80.0–100.0)
PLATELETS: 198 10*3/uL (ref 150–440)
RBC: 3.98 MIL/uL (ref 3.80–5.20)
RDW: 16 % — AB (ref 11.5–14.5)
WBC: 12.6 10*3/uL — ABNORMAL HIGH (ref 3.6–11.0)

## 2018-02-24 LAB — LIPASE, BLOOD: Lipase: 43 U/L (ref 11–51)

## 2018-02-24 LAB — GLUCOSE, CAPILLARY
GLUCOSE-CAPILLARY: 197 mg/dL — AB (ref 65–99)
GLUCOSE-CAPILLARY: 168 mg/dL — AB (ref 65–99)
Glucose-Capillary: 126 mg/dL — ABNORMAL HIGH (ref 65–99)

## 2018-02-24 LAB — COMPREHENSIVE METABOLIC PANEL
ALBUMIN: 4.3 g/dL (ref 3.5–5.0)
ALT: 24 U/L (ref 14–54)
ANION GAP: 12 (ref 5–15)
AST: 36 U/L (ref 15–41)
Alkaline Phosphatase: 132 U/L — ABNORMAL HIGH (ref 38–126)
BILIRUBIN TOTAL: 0.8 mg/dL (ref 0.3–1.2)
BUN: 64 mg/dL — ABNORMAL HIGH (ref 6–20)
CALCIUM: 8.8 mg/dL — AB (ref 8.9–10.3)
CO2: 19 mmol/L — ABNORMAL LOW (ref 22–32)
Chloride: 105 mmol/L (ref 101–111)
Creatinine, Ser: 5.78 mg/dL — ABNORMAL HIGH (ref 0.44–1.00)
GFR calc Af Amer: 8 mL/min — ABNORMAL LOW (ref >60)
GFR, EST NON AFRICAN AMERICAN: 7 mL/min — AB (ref >60)
GLUCOSE: 213 mg/dL — AB (ref 65–99)
Potassium: 7.1 mmol/L (ref 3.5–5.1)
Sodium: 136 mmol/L (ref 135–145)
TOTAL PROTEIN: 7.5 g/dL (ref 6.5–8.1)

## 2018-02-24 LAB — INFLUENZA PANEL BY PCR (TYPE A & B)
Influenza A By PCR: NEGATIVE
Influenza B By PCR: NEGATIVE

## 2018-02-24 LAB — POTASSIUM: Potassium: 3.4 mmol/L — ABNORMAL LOW (ref 3.5–5.1)

## 2018-02-24 LAB — PHOSPHORUS: Phosphorus: 1.7 mg/dL — ABNORMAL LOW (ref 2.5–4.6)

## 2018-02-24 MED ORDER — HEPARIN SODIUM (PORCINE) 5000 UNIT/ML IJ SOLN
5000.0000 [IU] | Freq: Three times a day (TID) | INTRAMUSCULAR | Status: DC
  Administered 2018-02-24 – 2018-02-25 (×2): 5000 [IU] via SUBCUTANEOUS
  Filled 2018-02-24 (×2): qty 1

## 2018-02-24 MED ORDER — PROMETHAZINE HCL 25 MG/ML IJ SOLN
INTRAMUSCULAR | Status: AC
  Administered 2018-02-24: 12.5 mg via INTRAVENOUS
  Filled 2018-02-24: qty 1

## 2018-02-24 MED ORDER — HYDROXYZINE HCL 25 MG PO TABS: 25.0000 mg | ORAL_TABLET | Freq: Two times a day (BID) | ORAL | Status: DC | PRN

## 2018-02-24 MED ORDER — SODIUM CHLORIDE 0.9 % IV SOLN: 250.0000 mL | INTRAVENOUS | Status: DC | PRN

## 2018-02-24 MED ORDER — ONDANSETRON HCL 4 MG PO TABS: 4.0000 mg | ORAL_TABLET | Freq: Four times a day (QID) | ORAL | Status: DC | PRN

## 2018-02-24 MED ORDER — ACETAMINOPHEN 500 MG PO TABS: 1000.0000 mg | ORAL_TABLET | Freq: Four times a day (QID) | ORAL | Status: DC | PRN

## 2018-02-24 MED ORDER — AMLODIPINE BESYLATE 10 MG PO TABS
10.0000 mg | ORAL_TABLET | Freq: Every day | ORAL | Status: DC
  Administered 2018-02-25: 10 mg via ORAL
  Filled 2018-02-24: qty 1

## 2018-02-24 MED ORDER — CALCIUM ACETATE (PHOS BINDER) 667 MG PO CAPS
667.0000 mg | ORAL_CAPSULE | Freq: Three times a day (TID) | ORAL | Status: DC
  Administered 2018-02-25 (×2): 667 mg via ORAL
  Filled 2018-02-24 (×3): qty 1

## 2018-02-24 MED ORDER — PROMETHAZINE HCL 25 MG/ML IJ SOLN: 12.5000 mg | Freq: Four times a day (QID) | INTRAMUSCULAR | Status: DC | PRN

## 2018-02-24 MED ORDER — SODIUM CHLORIDE 0.9% FLUSH
3.0000 mL | Freq: Two times a day (BID) | INTRAVENOUS | Status: DC
  Administered 2018-02-24 – 2018-02-25 (×3): 3 mL via INTRAVENOUS

## 2018-02-24 MED ORDER — ASPIRIN 81 MG PO TBEC: 81.0000 mg | DELAYED_RELEASE_TABLET | Freq: Every day | ORAL | Status: DC

## 2018-02-24 MED ORDER — ALBUTEROL SULFATE (2.5 MG/3ML) 0.083% IN NEBU
INHALATION_SOLUTION | RESPIRATORY_TRACT | Status: AC
  Administered 2018-02-24: 15 mg via RESPIRATORY_TRACT
  Filled 2018-02-24: qty 18

## 2018-02-24 MED ORDER — IPRATROPIUM-ALBUTEROL 0.5-2.5 (3) MG/3ML IN SOLN
3.0000 mL | RESPIRATORY_TRACT | Status: DC
  Administered 2018-02-24 (×2): 3 mL via RESPIRATORY_TRACT
  Filled 2018-02-24 (×2): qty 3

## 2018-02-24 MED ORDER — ACETAMINOPHEN 650 MG RE SUPP: 650.0000 mg | Freq: Four times a day (QID) | RECTAL | Status: DC | PRN

## 2018-02-24 MED ORDER — ASPIRIN EC 81 MG PO TBEC
81.0000 mg | DELAYED_RELEASE_TABLET | Freq: Every day | ORAL | Status: DC
  Administered 2018-02-25: 81 mg via ORAL
  Filled 2018-02-24: qty 1

## 2018-02-24 MED ORDER — IOPAMIDOL (ISOVUE-300) INJECTION 61%
75.0000 mL | Freq: Once | INTRAVENOUS | Status: AC | PRN
  Administered 2018-02-24: 75 mL via INTRAVENOUS

## 2018-02-24 MED ORDER — SODIUM BICARBONATE 650 MG PO TABS
1300.0000 mg | ORAL_TABLET | Freq: Two times a day (BID) | ORAL | Status: DC
  Administered 2018-02-24 – 2018-02-25 (×3): 1300 mg via ORAL
  Filled 2018-02-24 (×3): qty 2

## 2018-02-24 MED ORDER — SODIUM CHLORIDE 0.9% FLUSH: 3.0000 mL | INTRAVENOUS | Status: DC | PRN

## 2018-02-24 MED ORDER — HYDRALAZINE HCL 50 MG PO TABS
50.0000 mg | ORAL_TABLET | Freq: Three times a day (TID) | ORAL | Status: DC
  Administered 2018-02-24 – 2018-02-25 (×2): 50 mg via ORAL
  Filled 2018-02-24 (×3): qty 1

## 2018-02-24 MED ORDER — ALBUTEROL SULFATE (2.5 MG/3ML) 0.083% IN NEBU
15.0000 mg | INHALATION_SOLUTION | Freq: Once | RESPIRATORY_TRACT | Status: AC
  Administered 2018-02-24: 15 mg via RESPIRATORY_TRACT
  Filled 2018-02-24: qty 18

## 2018-02-24 MED ORDER — ACETAMINOPHEN 325 MG PO TABS
650.0000 mg | ORAL_TABLET | Freq: Four times a day (QID) | ORAL | Status: DC | PRN
  Administered 2018-02-24: 650 mg via ORAL
  Filled 2018-02-24: qty 2

## 2018-02-24 MED ORDER — PROMETHAZINE HCL 25 MG/ML IJ SOLN
12.5000 mg | Freq: Once | INTRAMUSCULAR | Status: AC
  Administered 2018-02-24: 12.5 mg via INTRAVENOUS

## 2018-02-24 MED ORDER — LOSARTAN POTASSIUM 50 MG PO TABS
50.0000 mg | ORAL_TABLET | Freq: Every day | ORAL | Status: DC
  Administered 2018-02-25: 50 mg via ORAL
  Filled 2018-02-24: qty 1

## 2018-02-24 MED ORDER — MORPHINE SULFATE (PF) 4 MG/ML IV SOLN
4.0000 mg | Freq: Once | INTRAVENOUS | Status: AC
  Administered 2018-02-24: 4 mg via INTRAVENOUS
  Filled 2018-02-24: qty 1

## 2018-02-24 MED ORDER — INSULIN ASPART 100 UNIT/ML ~~LOC~~ SOLN
0.0000 [IU] | Freq: Three times a day (TID) | SUBCUTANEOUS | Status: DC
  Administered 2018-02-24: 1 [IU] via SUBCUTANEOUS
  Administered 2018-02-25: 2 [IU] via SUBCUTANEOUS
  Filled 2018-02-24 (×2): qty 1

## 2018-02-24 MED ORDER — ONDANSETRON HCL 4 MG/2ML IJ SOLN: 4.0000 mg | Freq: Four times a day (QID) | INTRAMUSCULAR | Status: DC | PRN

## 2018-02-24 MED ORDER — IPRATROPIUM-ALBUTEROL 0.5-2.5 (3) MG/3ML IN SOLN: 3.0000 mL | RESPIRATORY_TRACT | Status: DC | PRN

## 2018-02-24 MED ORDER — INSULIN ASPART 100 UNIT/ML ~~LOC~~ SOLN
5.0000 [IU] | Freq: Once | SUBCUTANEOUS | Status: AC
  Administered 2018-02-24: 5 [IU] via INTRAVENOUS
  Filled 2018-02-24: qty 1

## 2018-02-24 MED ORDER — DEXTROSE 50 % IV SOLN
1.0000 | Freq: Once | INTRAVENOUS | Status: AC
  Administered 2018-02-24: 50 mL via INTRAVENOUS
  Filled 2018-02-24: qty 50

## 2018-02-24 MED ORDER — ONDANSETRON HCL 4 MG/2ML IJ SOLN
4.0000 mg | Freq: Once | INTRAMUSCULAR | Status: AC
  Administered 2018-02-24: 4 mg via INTRAVENOUS
  Filled 2018-02-24: qty 2

## 2018-02-24 MED ORDER — ATORVASTATIN CALCIUM 20 MG PO TABS
40.0000 mg | ORAL_TABLET | Freq: Every day | ORAL | Status: DC
  Filled 2018-02-24: qty 2

## 2018-02-24 NOTE — Progress Notes (Signed)
Interpreter called up upon patient being admitted to the floor. Patient was too lethargic to complete admission process and interpreter was not comfortable with continuing other than getting consent signed until patient was more alert. Head to toe assessment completed.

## 2018-02-24 NOTE — Progress Notes (Signed)
Late entry

## 2018-02-24 NOTE — Progress Notes (Signed)
Post HD Assessment : pt tolerated tx well, daughter came in after tx, pt needs education on high potassium foods (how much to eat)    02/24/18 1900  Neurological  Level of Consciousness Alert  Orientation Level Oriented to situation  Respiratory  Respiratory Pattern Regular  Chest Assessment Chest expansion symmetrical  Bilateral Breath Sounds Diminished  Cough None  Cardiac  Pulse Regular  Heart Sounds S1, S2  ECG Monitor Yes  Cardiac Rhythm NSR  Vascular  R Radial Pulse +2  L Radial Pulse +2  Edema Generalized  Psychosocial  Psychosocial (WDL) WDL

## 2018-02-24 NOTE — ED Triage Notes (Signed)
Vomiting and diarrhea since last night and hurts all over.  Went to dialysis and then would not dialyse her because she is sick

## 2018-02-24 NOTE — ED Notes (Signed)
Light green tube redrawn and sent to lab.

## 2018-02-24 NOTE — ED Notes (Signed)
I had spoken to patient with video interpretter, and she does admit to low back pain that hurts to move.  I explained clean catch urine and took her to bathroom, but she voided in an emesis basin that she had brought with her.  I informed fam member that she will need to collect again.  Incidentally, the urine looked clear.

## 2018-02-24 NOTE — Progress Notes (Signed)
HD Tx ended. Short. MD made aware, see comments    02/24/18 1830  Vital Signs  Pulse Rate 84  BP (!) 141/59  BP Location Left Arm  BP Method Automatic  Patient Position (if appropriate) Lying  Oxygen Therapy  SpO2 100 %  O2 Device Room Air  During Hemodialysis Assessment  Dialysis Fluid Bolus Normal Saline  Bolus Amount (mL) 250 mL  Intra-Hemodialysis Comments (tx shortend due to multiple machine alarms, MD aware.)  Post-Hemodialysis Assessment  Rinseback Volume (mL) 250 mL  Dialyzer Clearance Heavily streaked  Duration of HD Treatment -hour(s) 2.75 hour(s) (MD aware unable to complete TX , tech/machine issues )  Hemodialysis Intake (mL) 750 mL  UF Total -Machine (mL) 2286 mL  Net UF (mL) 1536 mL  Tolerated HD Treatment Yes  AVG/AVF Arterial Site Held (minutes) 15 minutes  AVG/AVF Venous Site Held (minutes) 5 minutes

## 2018-02-24 NOTE — Progress Notes (Signed)
Post HD [facility]    02/24/18 1845  Hand-Off documentation  Report given to (Full Name) Seth Bake, RN   Report received from (Full Name) Beatris Ship, RN   Vital Signs  Temp 98.6 F (37 C)  Temp Source Oral  Pulse Rate 86  BP (!) 130/54  BP Location Left Arm  BP Method Automatic  Patient Position (if appropriate) Lying  Oxygen Therapy  SpO2 100 %  O2 Device Room Air  Pain Assessment  Pain Assessment No/denies pain  Dialysis Weight  Weight 47.6 kg (104 lb 15 oz)  Type of Weight Post-Dialysis

## 2018-02-24 NOTE — H&P (Signed)
[facility] NAME: Anita Contreras    MR#:  [id number]  DATE OF BIRTH:  [birth year]  DATE OF ADMISSION:  02/24/2018  PRIMARY CARE PHYSICIAN: Frazier Richards, MD   REQUESTING/REFERRING PHYSICIAN:  Darel Hong, MD     CHIEF COMPLAINT:   Chief Complaint  Patient presents with  . Emesis  . Diarrhea    HISTORY OF PRESENT ILLNESS: Anita Contreras  is a 62 y.o. female with a known history of end-stage renal disease, anemia, hypertension, hyperlipidemia and a previous stroke presenting to the hospital with complaint of nausea vomiting that started earlier this morning.  Patient started having generalized body aches and started feeling very cold.  She went to dialysis and was unable to complete her dialysis.  Patient in the ER is noted to have severe hypokalemia.  Nephrology has been consulted to do an urgent dialysis.  CT scan of the abdomen shows no abdominal pathology. PAST MEDICAL HISTORY:   Past Medical History:  Diagnosis Date  . Acute cystitis with hematuria   . Anemia   . Cataracts, bilateral   . CKD (chronic kidney disease)   . Diabetic retinopathy, background ([place])   . Dialysis patient ([place])    Tues, Thurs, Sat  . Dizziness   . Hyperlipidemia   . Hypertension   . Migraine variant   . Neuropathy   . OP (osteoporosis)   . Personal history of noncompliance with medical treatment, presenting hazards to health   . Steal syndrome as complication of dialysis access ([place])   . Stroke ([place])   . Type II diabetes mellitus with ophthalmic manifestations, uncontrolled ([place])   . Vitamin D deficiency     PAST SURGICAL HISTORY:  Past Surgical History:  Procedure Laterality Date  . A/V SHUNT INTERVENTION N/A 01/26/2018   Procedure: A/V SHUNT INTERVENTION;  Surgeon: Algernon Huxley, MD;  Location: [place] [facility];  Service: Cardiovascular;  Laterality: N/A;  . A/V SHUNTOGRAM Right 01/26/2018   Procedure: A/V  SHUNTOGRAM;  Surgeon: Algernon Huxley, MD;  Location: [place] [facility];  Service: Cardiovascular;  Laterality: Right;  . AV FISTULA PLACEMENT Right 10/10/2016   Procedure: ARTERIOVENOUS (AV) FISTULA CREATION ( BRACHIOCEPHALIC );  Surgeon: Algernon Huxley, MD;  Location: [facility];  Service: Vascular;  Laterality: Right;  . DILATION AND CURETTAGE OF UTERUS    . DISTAL REVASCULARIZATION AND INTERVAL LIGATION (DRIL) Right 11/08/2016   Procedure: DISTAL REVASCULARIZATION AND INTERVAL LIGATION (DRIL);  Surgeon: Algernon Huxley, MD;  Location: [facility];  Service: Vascular;  Laterality: Right;  . PERIPHERAL VASCULAR CATHETERIZATION N/A 04/25/2016   Procedure: Dialysis/Perma Catheter Insertion;  Surgeon: Algernon Huxley, MD;  Location: [place] [facility];  Service: Cardiovascular;  Laterality: N/A;  . PERIPHERAL VASCULAR CATHETERIZATION Right 10/17/2016   Procedure: Upper Extremity Angiography;  Surgeon: Algernon Huxley, MD;  Location: [place] [facility];  Service: Cardiovascular;  Laterality: Right;    SOCIAL HISTORY:  Social History   Tobacco Use  . Smoking status: Never Smoker  . Smokeless tobacco: Never Used  Substance Use Topics  . Alcohol use: No    FAMILY HISTORY:  Family History  Problem Relation Age of Onset  . Hypertension Mother   . Hyperlipidemia Mother     DRUG ALLERGIES:  Allergies  Allergen Reactions  . Propofol Itching    Pt says " when getting anesthesia by body itches" unsure what type of anesthesia    REVIEW OF  SYSTEMS:   CONSTITUTIONAL: No fever, positive fatigue or positive weakness.  EYES: No blurred or double vision.  EARS, NOSE, AND THROAT: No tinnitus or ear pain.  RESPIRATORY: No cough, shortness of breath, wheezing or hemoptysis.  CARDIOVASCULAR: No chest pain, orthopnea, edema.  GASTROINTESTINAL: No nausea, vomiting, positive diarrhea or abdominal pain.  GENITOURINARY: No dysuria, hematuria.  ENDOCRINE: No polyuria, nocturia,  HEMATOLOGY: No anemia, easy bruising or  bleeding SKIN: No rash or lesion. MUSCULOSKELETAL: No joint pain or arthritis.   NEUROLOGIC: No tingling, numbness, weakness.  PSYCHIATRY: No anxiety or depression.   MEDICATIONS AT HOME:  Prior to Admission medications   Medication Sig Start Date End Date Taking? Authorizing Provider  amLODipine (NORVASC) 10 MG tablet Take 1 tablet (10 mg total) by mouth daily. 01/27/18  Yes Bettey Costa, MD  aspirin 81 MG EC tablet TAKE 1 TABLET BY MOUTH EVERY DAY 04/15/17  Yes Stegmayer, Joelene Millin A, PA-C  atorvastatin (LIPITOR) 40 MG tablet Take 40 mg by mouth daily at 6 PM.    Yes [provider]  calcium acetate (PHOSLO) 667 MG capsule Take 1 capsule (667 mg total) by mouth 3 (three) times daily with meals. 04/19/16  Yes Fritzi Mandes, MD  docusate sodium (COLACE) 100 MG capsule Take 100 mg by mouth 2 (two) times daily.   Yes [provider]  furosemide (LASIX) 20 MG tablet Take 20 mg by mouth daily.   Yes [provider]  hydrALAZINE (APRESOLINE) 50 MG tablet Take 1 tablet (50 mg total) by mouth 3 (three) times daily. 01/27/18  Yes Mody, Ulice Bold, MD  insulin NPH-regular Human (NOVOLIN 70/30) (70-30) 100 UNIT/ML injection Inject 6 Units into the skin 2 (two) times daily with a meal.    Yes [provider]  losartan (COZAAR) 50 MG tablet Take 1 tablet (50 mg total) by mouth daily. 01/27/18  Yes Mody, Ulice Bold, MD  sodium bicarbonate 650 MG tablet Take 1,300 mg by mouth 2 (two) times daily.   Yes [provider]  acetaminophen (TYLENOL) 500 MG tablet Take 1,000 mg by mouth every 6 (six) hours as needed for mild pain or headache.    [provider]  hydrOXYzine (ATARAX/VISTARIL) 25 MG tablet Take 25 mg by mouth 2 (two) times daily as needed for itching.    [provider]  lidocaine-prilocaine (EMLA) cream Apply 1 application topically as needed.    [provider]  Nutritional Supplements (FEEDING SUPPLEMENT, NEPRO CARB STEADY,) LIQD Take 237 mLs by  mouth 2 (two) times daily between meals. 05/07/16   Epifanio Lesches, MD      PHYSICAL EXAMINATION:   VITAL SIGNS: Blood pressure (!) 215/71, pulse 78, temperature 99.8 F (37.7 C), temperature source Oral, resp. rate (!) 24, height 5' (1.524 m), weight 106 lb (48.1 kg), last menstrual period 09/19/2001, SpO2 98 %.  GENERAL:  62 y.o.-year-old patient lying in the bed with no acute distress.  EYES: Pupils equal, round, reactive to light and accommodation. No scleral icterus. Extraocular muscles intact.  HEENT: Head atraumatic, normocephalic. Oropharynx and nasopharynx clear.  NECK:  Supple, no jugular venous distention. No thyroid enlargement, no tenderness.  LUNGS: Normal breath sounds bilaterally, no wheezing, rales,rhonchi or crepitation. No use of accessory muscles of respiration.  CARDIOVASCULAR: S1, S2 normal. No murmurs, rubs, or gallops.  ABDOMEN: Soft, nontender, nondistended. Bowel sounds present. No organomegaly or mass.  EXTREMITIES: No pedal edema, cyanosis, or clubbing.  NEUROLOGIC: Cranial nerves II through XII are intact. Muscle strength 5/5 in all  extremities. Sensation intact. Gait not checked.  PSYCHIATRIC: The patient is alert and oriented x 3.  SKIN: No obvious rash, lesion, or ulcer.   LABORATORY PANEL:   CBC Recent Labs  Lab 02/24/18 0746  WBC 12.6*  HGB 12.4  HCT 37.1  PLT 198  MCV 93.4  MCH 31.2  MCHC 33.4  RDW 16.0*   ------------------------------------------------------------------------------------------------------------------  Chemistries  Recent Labs  Lab 02/24/18 1001  NA 136  K 7.1*  CL 105  CO2 19*  GLUCOSE 213*  BUN 64*  CREATININE 5.78*  CALCIUM 8.8*  AST 36  ALT 24  ALKPHOS 132*  BILITOT 0.8   ------------------------------------------------------------------------------------------------------------------ estimated creatinine clearance is 7.3 mL/min (A) (by C-G formula based on SCr of 5.78 mg/dL  (H)). ------------------------------------------------------------------------------------------------------------------ No results for input(s): TSH, T4TOTAL, T3FREE, THYROIDAB in the last 72 hours.  Invalid input(s): FREET3   Coagulation profile No results for input(s): INR, PROTIME in the last 168 hours. ------------------------------------------------------------------------------------------------------------------- No results for input(s): DDIMER in the last 72 hours. -------------------------------------------------------------------------------------------------------------------  Cardiac Enzymes No results for input(s): CKMB, TROPONINI, MYOGLOBIN in the last 168 hours.  Invalid input(s): CK ------------------------------------------------------------------------------------------------------------------ Invalid input(s): POCBNP  ---------------------------------------------------------------------------------------------------------------  Urinalysis    Component Value Date/Time   COLORURINE AMBER (A) 12/23/2017 1645   APPEARANCEUR CLOUDY (A) 12/23/2017 1645   LABSPEC 1.016 12/23/2017 1645   PHURINE 7.0 12/23/2017 1645   GLUCOSEU 50 (A) 12/23/2017 1645   HGBUR NEGATIVE 12/23/2017 1645   BILIRUBINUR NEGATIVE 12/23/2017 1645   KETONESUR NEGATIVE 12/23/2017 1645   PROTEINUR >=300 (A) 12/23/2017 1645   NITRITE NEGATIVE 12/23/2017 1645   LEUKOCYTESUR LARGE (A) 12/23/2017 1645     RADIOLOGY: Ct Abdomen Pelvis W Contrast  Result Date: 02/24/2018 CLINICAL DATA:  Vomiting and diarrhea.  Diffuse abdominal pain. EXAM: CT ABDOMEN AND PELVIS WITH CONTRAST TECHNIQUE: Multidetector CT imaging of the abdomen and pelvis was performed using the standard protocol following bolus administration of intravenous contrast. CONTRAST:  3mL ISOVUE-300 IOPAMIDOL (ISOVUE-300) INJECTION 61% COMPARISON:  CT scan dated 12/23/2017 FINDINGS: Lower chest: Interval almost complete clearing of the  infiltrate in the right lower lobe since the prior exam. Minimal residual haziness at that site. Heart size is normal. Small hiatal hernia. Hepatobiliary: No focal liver abnormality is seen. No gallstones, gallbladder wall thickening, or biliary dilatation. Pancreas: Unremarkable. No pancreatic ductal dilatation or surrounding inflammatory changes. Spleen: Normal in size without focal abnormality. Adrenals/Urinary Tract: Adrenal glands are unremarkable. Kidneys are normal, without renal calculi, focal lesion, or hydronephrosis. Bladder is unremarkable. Stomach/Bowel: Stomach is within normal limits. Appendix appears normal. No evidence of bowel wall thickening, distention, or inflammatory changes. Vascular/Lymphatic: Aortic atherosclerosis. No enlarged abdominal or pelvic lymph nodes. Reproductive: Uterus and bilateral adnexa are unremarkable. Other: No abdominal wall hernia or abnormality. No abdominopelvic ascites. Musculoskeletal: No acute or significant osseous findings. IMPRESSION: 1. No acute abnormality of the abdomen or pelvis. 2. Almost complete clearing of the consolidative infiltrate seen in the posterior aspect of the right lower lobe on the prior CT scan 12/23/2017. Electronically Signed   By: Lorriane Shire M.D.   On: 02/24/2018 10:34    EKG: Orders placed or performed during the hospital encounter of 02/24/18  . ED EKG  . ED EKG  . EKG 12-Lead  . EKG 12-Lead    IMPRESSION AND PLAN: Patient 62 year old white female with history of end-stage renal disease, hypertension, diabetes presenting with nausea vomiting and diarrhea  1.  Hyperkalemia nephrology has been consulted she will receive hemodialysis  2.  Nausea  vomiting diarrhea body aches we will check the influenza A make sure she does not have the flu If symptoms persist may need to have stool for C. Difficile  3 end-stage renal disease hemodialysis as above  4.  Diabetes type 2 we will place on sliding scale insulin hold her  7030 insulin since she will not be eating much  5.  Essential hypertension continue amlodipine and Cozaar  6.  Edema continue lipid  7.  Miscellaneous heparin for DVT prophylaxis   All the records are reviewed and case discussed with ED provider. Management plans discussed with the patient, family and they are in agreement.  CODE STATUS: Code Status History    Date Active Date Inactive Code Status Order ID Comments User Context   01/26/2018 15:33 01/27/2018 22:12 Full Code [id number]  Bettey Costa, MD Inpatient   12/24/2017 01:31 12/25/2017 20:58 Full Code [id number]  Lance Coon, MD Inpatient   01/14/2017 09:25 01/17/2017 18:03 Full Code [id number]  Laverle Hobby, MD ED   11/08/2016 18:08 11/10/2016 17:54 Full Code [id number]  Algernon Huxley, MD Inpatient   10/17/2016 13:19 10/17/2016 19:24 Full Code [id number]  Algernon Huxley, MD Inpatient   04/23/2016 20:29 05/07/2016 20:01 Full Code [id number]  Vaughan Basta, MD Inpatient   04/15/2016 19:18 04/19/2016 16:08 Full Code [id number]  Gladstone Lighter, MD Inpatient       TOTAL TIME TAKING CARE OF THIS PATIENT: 49minutes.    Dustin Flock M.D on 02/24/2018 at 11:05 AM  Between 7am to 6pm - Pager - [phone]  After 6pm go to [email or web address] - password [email or web address]  [facility] Office  [phone]  CC: Primary care physician; Frazier Richards, MD

## 2018-02-24 NOTE — ED Notes (Signed)
Patient transported to CT 

## 2018-02-24 NOTE — Progress Notes (Signed)
[facility], [place] 02/24/18  Subjective:   Patient known to our practice from previous admissions.  Last seen on January 27, 2018.   Anita Contreras is through [place] interpreter Patient started to have acute onset of diarrhea, nausea, vomiting, chills at 1 AM.  She presented to dialysis but because of her acute illness, she was referred to the ER for evaluation Labs in the emergency room show critically elevated potassium of 7.1 Peaked T waves noted on telemetry monitor Patient was given shifting measures in the emergency room and urgent dialysis treatment was requested  Objective:  Vital signs in last 24 hours:  Temp:  [99.8 F (37.7 C)] 99.8 F (37.7 C) (03/19 0752) Pulse Rate:  [78] 78 (03/19 0752) Resp:  [24] 24 (03/19 0752) BP: (215)/(71) 215/71 (03/19 0752) SpO2:  [98 %] 98 % (03/19 0752) Weight:  [48.1 kg (106 lb)] 48.1 kg (106 lb) (03/19 0728)  Weight change:  Filed Weights   02/24/18 0728  Weight: 48.1 kg (106 lb)    Intake/Output:   No intake or output data in the 24 hours ending 02/24/18 1114   Physical Exam: General:  Chronically ill-appearing, laying in the bed  HEENT  anicteric, moist oral mucous membranes  Neck  supple  Pulm/lungs  bilateral diffuse crackles  CVS/Heart  tachycardic, regular  Abdomen:   Soft, nontender  Extremities:  Trace edema  Neurologic:  Alert, oriented, able to answer questions  Skin:  No acute rashes  Access:  Right upper arm AV fistula       Basic Metabolic Panel:  Recent Labs  Lab 02/24/18 1001  NA 136  K 7.1*  CL 105  CO2 19*  GLUCOSE 213*  BUN 64*  CREATININE 5.78*  CALCIUM 8.8*     CBC: Recent Labs  Lab 02/24/18 0746  WBC 12.6*  HGB 12.4  HCT 37.1  MCV 93.4  PLT 198      Lab Results  Component Value Date   HEPBSAG Negative 12/25/2017   HEPBSAB Non Reactive 01/14/2017   HEPBIGM Negative 04/16/2016      Microbiology:  No results found for this or any previous  visit (from the past 240 hour(s)).  Coagulation Studies: No results for input(s): LABPROT, INR in the last 72 hours.  Urinalysis: No results for input(s): COLORURINE, LABSPEC, PHURINE, GLUCOSEU, HGBUR, BILIRUBINUR, KETONESUR, PROTEINUR, UROBILINOGEN, NITRITE, LEUKOCYTESUR in the last 72 hours.  Invalid input(s): APPERANCEUR    Imaging: Ct Abdomen Pelvis W Contrast  Result Date: 02/24/2018 CLINICAL DATA:  Vomiting and diarrhea.  Diffuse abdominal pain. EXAM: CT ABDOMEN AND PELVIS WITH CONTRAST TECHNIQUE: Multidetector CT imaging of the abdomen and pelvis was performed using the standard protocol following bolus administration of intravenous contrast. CONTRAST:  73mL ISOVUE-300 IOPAMIDOL (ISOVUE-300) INJECTION 61% COMPARISON:  CT scan dated 12/23/2017 FINDINGS: Lower chest: Interval almost complete clearing of the infiltrate in the right lower lobe since the prior exam. Minimal residual haziness at that site. Heart size is normal. Small hiatal hernia. Hepatobiliary: No focal liver abnormality is seen. No gallstones, gallbladder wall thickening, or biliary dilatation. Pancreas: Unremarkable. No pancreatic ductal dilatation or surrounding inflammatory changes. Spleen: Normal in size without focal abnormality. Adrenals/Urinary Tract: Adrenal glands are unremarkable. Kidneys are normal, without renal calculi, focal lesion, or hydronephrosis. Bladder is unremarkable. Stomach/Bowel: Stomach is within normal limits. Appendix appears normal. No evidence of bowel wall thickening, distention, or inflammatory changes. Vascular/Lymphatic: Aortic atherosclerosis. No enlarged abdominal or pelvic lymph nodes. Reproductive: Uterus and bilateral adnexa are  unremarkable. Other: No abdominal wall hernia or abnormality. No abdominopelvic ascites. Musculoskeletal: No acute or significant osseous findings. IMPRESSION: 1. No acute abnormality of the abdomen or pelvis. 2. Almost complete clearing of the consolidative infiltrate  seen in the posterior aspect of the right lower lobe on the prior CT scan 12/23/2017. Electronically Signed   By: Lorriane Shire M.D.   On: 02/24/2018 10:34     Medications:       Assessment/ Plan:  62 y.o. female with end-stage renal disease, diabetes with retinopathy, neuropathy admitted for evaluation and management of pneumonia  [facility] nephrology/TTS am shift  1.  Severe hyperkalemia 2.  End-stage renal disease 3.  Secondary hyperparathyroidism 4.  Anemia of chronic kidney disease, hemoglobin 12.4  Plan: Urgent hemodialysis for correction of hyperkalemia Volume removal with hemodialysis as tolerated Hemoglobin 12.4, hold ESA for now    LOS: 0 Anita Contreras 3/19/201911:14 AM  [address] [place], [place]  Note: This note was prepared with Dragon dictation. Any transcription errors are unintentional

## 2018-02-24 NOTE — ED Provider Notes (Signed)
[facility] Emergency Department Provider Note  ____________________________________________   First MD Initiated Contact with Patient 02/24/18 1002     (approximate)  I have reviewed the triage vital signs and the nursing notes.   HISTORY  Chief Complaint Emesis and Diarrhea   HPI Anita Contreras is a 62 y.o. female who comes to the emergency department with abdominal pain nausea and diarrhea that began last night.  She has a past medical history of end-stage renal disease and was last dialyzed on Thursday.  She attempted to go to dialysis this morning however she was determined to be "too sick" so they advised to come to the emergency department.  She reports moderate severity intermittent cramping nonradiating diffuse abdominal discomfort along with loose watery stools.  She reports nausea but no vomiting.  She has no history of abdominal surgeries.  Nothing seems to make her symptoms better or worse.  Past Medical History:  Diagnosis Date  . Acute cystitis with hematuria   . Anemia   . Cataracts, bilateral   . CKD (chronic kidney disease)   . Diabetic retinopathy, background ([place])   . Dialysis patient ([place])    Tues, Thurs, Sat  . Dizziness   . Hyperlipidemia   . Hypertension   . Migraine variant   . Neuropathy   . OP (osteoporosis)   . Personal history of noncompliance with medical treatment, presenting hazards to health   . Steal syndrome as complication of dialysis access ([place])   . Stroke ([place])   . Type II diabetes mellitus with ophthalmic manifestations, uncontrolled ([place])   . Vitamin D deficiency     Patient Active Problem List   Diagnosis Date Noted  . HTN (hypertension) 01/26/2018  . HCAP (healthcare-associated pneumonia) 12/23/2017  . Bradycardia 01/17/2017  . Syncope 01/17/2017  . Nausea and vomiting 01/17/2017  . Essential hypertension, malignant 01/17/2017  . Lactic acidosis 01/17/2017  . Leukocytosis 01/17/2017  .  Orthostatic hypotension 01/17/2017  . Hyperkalemia 01/14/2017  . Steal syndrome as complication of dialysis access ([place]) 11/08/2016  . ESRD on dialysis ([place]) 10/15/2016  . Renal dialysis device, implant, or graft complication 40/98/1191  . Hyperlipidemia 10/15/2016  . Essential hypertension 10/15/2016  . Controlled diabetes mellitus type 2 with complications ([place]) 47/82/9562  . Acute on chronic renal failure ([place]) 04/23/2016  . 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 Acute right flank pain   . ARF (acute renal failure) ([place]) 04/15/2016  . Acute cystitis 05/26/2015  . Neuropathy 05/26/2015    Past Surgical History:  Procedure Laterality Date  . A/V SHUNT INTERVENTION N/A 01/26/2018   Procedure: A/V SHUNT INTERVENTION;  Surgeon: Algernon Huxley, MD;  Location: [place] [facility];  Service: Cardiovascular;  Laterality: N/A;  . A/V SHUNTOGRAM Right 01/26/2018   Procedure: A/V SHUNTOGRAM;  Surgeon: Algernon Huxley, MD;  Location: [place] [facility];  Service: Cardiovascular;  Laterality: Right;  . AV FISTULA PLACEMENT Right 10/10/2016   Procedure: ARTERIOVENOUS (AV) FISTULA CREATION ( BRACHIOCEPHALIC );  Surgeon: Algernon Huxley, MD;  Location: [facility];  Service: Vascular;  Laterality: Right;  . DILATION AND CURETTAGE OF UTERUS    . DISTAL REVASCULARIZATION AND INTERVAL LIGATION (DRIL) Right 11/08/2016   Procedure: DISTAL REVASCULARIZATION AND INTERVAL LIGATION (DRIL);  Surgeon: Algernon Huxley, MD;  Location: [facility];  Service: Vascular;  Laterality: Right;  . PERIPHERAL VASCULAR CATHETERIZATION N/A 04/25/2016   Procedure: Dialysis/Perma Catheter Insertion;  Surgeon: Algernon Huxley, MD;  Location:  [place] [facility];  Service: Cardiovascular;  Laterality: N/A;  . PERIPHERAL VASCULAR CATHETERIZATION Right 10/17/2016   Procedure: Upper Extremity Angiography;  Surgeon: Algernon Huxley, MD;  Location: [place] [facility];  Service: Cardiovascular;  Laterality: Right;    Prior  to Admission medications   Medication Sig Start Date End Date Taking? Authorizing Provider  amLODipine (NORVASC) 10 MG tablet Take 1 tablet (10 mg total) by mouth daily. 01/27/18  Yes Bettey Costa, MD  aspirin 81 MG EC tablet TAKE 1 TABLET BY MOUTH EVERY DAY 04/15/17  Yes Stegmayer, Joelene Millin A, PA-C  atorvastatin (LIPITOR) 40 MG tablet Take 40 mg by mouth daily at 6 PM.    Yes [provider]  calcium acetate (PHOSLO) 667 MG capsule Take 1 capsule (667 mg total) by mouth 3 (three) times daily with meals. 04/19/16  Yes Fritzi Mandes, MD  docusate sodium (COLACE) 100 MG capsule Take 100 mg by mouth 2 (two) times daily.   Yes [provider]  furosemide (LASIX) 20 MG tablet Take 20 mg by mouth daily.   Yes [provider]  hydrALAZINE (APRESOLINE) 50 MG tablet Take 1 tablet (50 mg total) by mouth 3 (three) times daily. 01/27/18  Yes Mody, Ulice Bold, MD  insulin NPH-regular Human (NOVOLIN 70/30) (70-30) 100 UNIT/ML injection Inject 6 Units into the skin 2 (two) times daily with a meal.    Yes [provider]  losartan (COZAAR) 50 MG tablet Take 1 tablet (50 mg total) by mouth daily. 01/27/18  Yes Mody, Ulice Bold, MD  sodium bicarbonate 650 MG tablet Take 1,300 mg by mouth 2 (two) times daily.   Yes [provider]  acetaminophen (TYLENOL) 500 MG tablet Take 1,000 mg by mouth every 6 (six) hours as needed for mild pain or headache.    [provider]  hydrOXYzine (ATARAX/VISTARIL) 25 MG tablet Take 25 mg by mouth 2 (two) times daily as needed for itching.    [provider]  lidocaine-prilocaine (EMLA) cream Apply 1 application topically as needed.    [provider]  Nutritional Supplements (FEEDING SUPPLEMENT, NEPRO CARB STEADY,) LIQD Take 237 mLs by mouth 2 (two) times daily between meals. 05/07/16   Epifanio Lesches, MD    Allergies Propofol  Family History  Problem Relation Age of Onset  . Hypertension Mother   . Hyperlipidemia Mother      Social History Social History   Tobacco Use  . Smoking status: Never Smoker  . Smokeless tobacco: Never Used  Substance Use Topics  . Alcohol use: No  . Drug use: No    Review of Systems Constitutional: No fever/chills Eyes: No visual changes. ENT: No sore throat. Cardiovascular: Denies chest pain. Respiratory: Denies shortness of breath. Gastrointestinal: Positive for abdominal pain.  Positive for nausea, no vomiting.  Positive for diarrhea.  No constipation. Genitourinary: Negative for dysuria. Musculoskeletal: Negative for back pain. Skin: Negative for rash. Neurological: Negative for headaches, focal weakness or numbness.   ____________________________________________   PHYSICAL EXAM:  VITAL SIGNS: ED Triage Vitals  Enc Vitals Group     BP 02/24/18 0752 (!) 215/71     Pulse Rate 02/24/18 0752 78     Resp 02/24/18 0752 (!) 24     Temp 02/24/18 0752 99.8 F (37.7 C)     Temp Source 02/24/18 0752 Oral     SpO2 02/24/18 0752 98 %     Weight 02/24/18 0728 106 lb (48.1 kg)     Height 02/24/18 0728 5' (  1.524 m)     Head Circumference --      Peak Flow --      Pain Score 02/24/18 0727 10     Pain Loc --      Pain Edu? --      Excl. in [place]? --     Constitutional: Alert and oriented x4 curled on her side appears uncomfortable moaning in pain Eyes: PERRL EOMI. Head: Atraumatic. Nose: No congestion/rhinnorhea. Mouth/Throat: No trismus Neck: No stridor.   Cardiovascular: Normal rate, regular rhythm. Grossly normal heart sounds.  Good peripheral circulation. Respiratory: Increased respiratory effort.  No retractions. Lungs CTAB and moving good air Gastrointestinal: Soft abdomen diffusely tender although no focality and no frank peritonitis Musculoskeletal: No lower extremity edema   Neurologic:  Normal speech and language. No gross focal neurologic deficits are appreciated. Skin:  Skin is warm, dry and intact. No rash noted. Psychiatric: Mood and affect are  normal. Speech and behavior are normal.    ____________________________________________   DIFFERENTIAL includes but not limited to  Appendicitis, diverticulitis, small bowel obstruction, pyelonephritis, nephrolithiasis ____________________________________________   LABS (all labs ordered are listed, but only abnormal results are displayed)  Labs Reviewed  CBC - Abnormal; Notable for the following components:      Result Value   WBC 12.6 (*)    RDW 16.0 (*)    All other components within normal limits  COMPREHENSIVE METABOLIC PANEL - Abnormal; Notable for the following components:   Potassium 7.1 (*)    CO2 19 (*)    Glucose, Bld 213 (*)    BUN 64 (*)    Creatinine, Ser 5.78 (*)    Calcium 8.8 (*)    Alkaline Phosphatase 132 (*)    GFR calc non Af Amer 7 (*)    GFR calc Af Amer 8 (*)    All other components within normal limits  LIPASE, BLOOD  URINALYSIS, COMPLETE (UACMP) WITH MICROSCOPIC    Lab work reviewed by me with life-threatening hyperkalemia __________________________________________  EKG ED ECG REPORT I, Darel Hong, the attending physician, personally viewed and interpreted this ECG.  Date: 02/24/2018 EKG Time:  Rate: 80 Rhythm: normal sinus rhythm QRS Axis: Leftward axis Intervals: normal ST/T Wave abnormalities: Peaked T waves consistent with hyperkalemia Narrative Interpretation: no evidence of acute ischemia  ____________________________________________  RADIOLOGY  CT abdomen pelvis reviewed by me with no acute disease ____________________________________________   PROCEDURES  Procedure(s) performed: no  .Critical Care Performed by: Darel Hong, MD Authorized by: Darel Hong, MD   Critical care provider statement:    Critical care time (minutes):  30   Critical care time was exclusive of:  Separately billable procedures and treating other patients   Critical care was necessary to treat or prevent imminent or  life-threatening deterioration of the following conditions:  Metabolic crisis   Critical care was time spent personally by me on the following activities:  Development of treatment plan with patient or surrogate, discussions with consultants, evaluation of patient's response to treatment, examination of patient, obtaining history from patient or surrogate, ordering and performing treatments and interventions, ordering and review of laboratory studies, ordering and review of radiographic studies, pulse oximetry, re-evaluation of patient's condition and review of old charts    Critical Care performed: Yes  Observation: no ____________________________________________   INITIAL IMPRESSION / ASSESSMENT AND PLAN / ED COURSE  Pertinent labs & imaging results that were available during my care of the patient were reviewed by me and considered in my medical  decision making (see chart for details).  The patient arrives tachycardic, tachypneic, uncomfortable appearing, with abdominal pain nausea and diarrhea.  Differential in an elderly woman with abdominal pain is broad but she does require a CT scan with IV contrast.  As the patient has been on dialysis for more than 6 months I do not need to wait for labs and I have contacted the CT tech to expedite her to CT now.  IV morphine and Zofran for pain and nausea.     ----------------------------------------- 10:49 AM on 02/24/2018 -----------------------------------------  Patient's potassium came back at 7.1.  5 units of IV insulin along with IV dextrose, 15 mg of nebulized albuterol for hyperkalemia and EKG changes.  I discussed with on-call nephrology Dr. Candiss Norse who will dialyze the patient emergently.  I then discussed with the family who verbalized understanding and agreement with the plan.  I then discussed with the hospitalist who has graciously agreed to admit the patient to his service. ____________________________________________   FINAL  CLINICAL IMPRESSION(S) / ED DIAGNOSES  Final diagnoses:  None      NEW MEDICATIONS STARTED DURING THIS VISIT:  New Prescriptions   No medications on file     Note:  This document was prepared using Dragon voice recognition software and may include unintentional dictation errors.     Darel Hong, MD 02/24/18 1058

## 2018-02-24 NOTE — Progress Notes (Signed)
Received report from floor RN, informed that pt is on isolation. Pt will be a bedside 1:1. Equipment moved from unit to pt's room.    02/24/18 1430  Hand-Off documentation  Report given to (Full Name) Beatris Ship, RN   Report received from (Full Name) Seth Bake, RN   Vital Signs  Temp 99 F (37.2 C)  Temp Source Oral  Pulse Rate (!) 116  Resp 20  BP (!) 193/87  BP Location Left Arm  BP Method Automatic  Patient Position (if appropriate) Lying  Oxygen Therapy  SpO2 100 %  O2 Device Room Air  Pain Assessment  Pain Assessment No/denies pain  Dialysis Weight  Weight 48.5 kg (106 lb 14.8 oz)  Type of Weight Pre-Dialysis  Time-Out for Hemodialysis  What Procedure? HD  Pt Identifiers(min of two) First/Last Name;MRN/Account#  Correct Site? Yes  Correct Side? Yes  Correct Procedure? Yes  Consents Verified? Yes  Rad Studies Available? N/A  Safety Precautions Reviewed? Yes  Engineer, civil (consulting) Number [id number]  Station Number (Bedside)  UF/Alarm Test Passed  Conductivity: Meter 14  Conductivity: Machine  14  pH 7.6  Reverse Osmosis Portable   Normal Saline Lot Number [id number]  Dialyzer Lot Number [id number]  Disposable Set Lot Number [id number]  Machine Temperature 98.6 F (37 C)  Musician and Audible Yes  Blood Lines Intact and Secured Yes  Pre Treatment Patient Checks  Vascular access used during treatment Fistula  Hepatitis B Surface Antigen Results Negative (unknown)  Date Hepatitis B Surface Antigen Drawn 12/25/17  Isolation Initiated Yes  Hepatitis B Surface Antibody 310.7 (>10)  Date Hepatitis B Surface Antibody Drawn 12/25/17  Hemodialysis Consent Verified Yes  Hemodialysis Standing Orders Initiated Yes  ECG (Telemetry) Monitor On Yes  Prime Ordered Normal Saline  Length of  DialysisTreatment -hour(s) 3.5 Hour(s)  Dialysis Treatment Comments Start w/ 1K, check K every hour. Switch when K <5.5  Dialyzer Elisio 17H NR  Dialysate 3K, 2.5 Ca  Dialysis  Anticoagulant None  Dialysate Flow Ordered 800  Blood Flow Rate Ordered 400 mL/min  Ultrafiltration Goal 2 Liters  Education / Care Plan  Dialysis Education Provided Yes  Documented Education in Care Plan Yes  Note  Observations (Patient is spanish speaking )

## 2018-02-24 NOTE — Progress Notes (Signed)
Pre HD Assessment  

## 2018-02-25 ENCOUNTER — Ambulatory Visit (INDEPENDENT_AMBULATORY_CARE_PROVIDER_SITE_OTHER): Payer: MEDICAID | Admitting: Vascular Surgery

## 2018-02-25 ENCOUNTER — Encounter (INDEPENDENT_AMBULATORY_CARE_PROVIDER_SITE_OTHER): Payer: MEDICAID

## 2018-02-25 LAB — BASIC METABOLIC PANEL
Anion gap: 10 (ref 5–15)
BUN: 40 mg/dL — AB (ref 6–20)
CO2: 28 mmol/L (ref 22–32)
CREATININE: 5 mg/dL — AB (ref 0.44–1.00)
Calcium: 7.7 mg/dL — ABNORMAL LOW (ref 8.9–10.3)
Chloride: 98 mmol/L — ABNORMAL LOW (ref 101–111)
GFR, EST AFRICAN AMERICAN: 10 mL/min — AB (ref >60)
GFR, EST NON AFRICAN AMERICAN: 9 mL/min — AB (ref >60)
Glucose, Bld: 126 mg/dL — ABNORMAL HIGH (ref 65–99)
POTASSIUM: 5 mmol/L (ref 3.5–5.1)
SODIUM: 136 mmol/L (ref 135–145)

## 2018-02-25 LAB — CBC
HCT: 28.1 % — ABNORMAL LOW (ref 35.0–47.0)
Hemoglobin: 9.5 g/dL — ABNORMAL LOW (ref 12.0–16.0)
MCH: 31.4 pg (ref 26.0–34.0)
MCHC: 33.6 g/dL (ref 32.0–36.0)
MCV: 93.5 fL (ref 80.0–100.0)
PLATELETS: 145 10*3/uL — AB (ref 150–440)
RBC: 3.01 MIL/uL — AB (ref 3.80–5.20)
RDW: 15.5 % — ABNORMAL HIGH (ref 11.5–14.5)
WBC: 6.1 10*3/uL (ref 3.6–11.0)

## 2018-02-25 LAB — GLUCOSE, CAPILLARY
Glucose-Capillary: 115 mg/dL — ABNORMAL HIGH (ref 65–99)
Glucose-Capillary: 152 mg/dL — ABNORMAL HIGH (ref 65–99)

## 2018-02-25 NOTE — Progress Notes (Signed)
[facility], [place] 02/25/18  Subjective:   Patient known to our practice from previous admissions.  Last seen on January 27, 2018.   Anita Contreras is through [place] interpreter Patient started to have acute onset of diarrhea, nausea, vomiting, chills at 1 AM.  She presented to dialysis but because of her acute illness, she was referred to the ER for evaluation Labs in the emergency room show critically elevated potassium of 7.1 Peaked T waves noted on telemetry monitor Patient was given shifting measures in the emergency room and urgent dialysis treatment was requested  Patient underwent urgent hemodialysis yesterday 1500 cc of fluid was removed.  Hemodialysis treatment was shortened because of technical problems with the machine Potassium this morning is 5.0  Objective:  Vital signs in last 24 hours:  Temp:  [97.9 F (36.6 C)-100.9 F (38.3 C)] 99 F (37.2 C) (03/20 0512) Pulse Rate:  [83-124] 91 (03/20 0516) Resp:  [18-23] 20 (03/20 0408) BP: (130-207)/(47-95) 155/64 (03/20 1028) SpO2:  [97 %-100 %] 97 % (03/20 1028) Weight:  [47.6 kg (104 lb 15 oz)-48.5 kg (106 lb 14.8 oz)] 47.6 kg (104 lb 15 oz) (03/19 1845)  Weight change:  Filed Weights   02/24/18 0728 02/24/18 1430 02/24/18 1845  Weight: 48.1 kg (106 lb) 48.5 kg (106 lb 14.8 oz) 47.6 kg (104 lb 15 oz)    Intake/Output:    Intake/Output Summary (Last 24 hours) at 02/25/2018 1055 Last data filed at 02/25/2018 1004 Gross per 24 hour  Intake 660 ml  Output 1536 ml  Net -876 ml     Physical Exam: General:  No acute distress, sitting up in bed  HEENT  anicteric, moist oral mucous membranes  Neck  supple  Pulm/lungs  mild basilar crackles  CVS/Heart  tachycardic, regular  Abdomen:   Soft, nontender  Extremities:  Trace edema  Neurologic:  Alert, oriented, able to answer questions  Skin:  No acute rashes  Access:  Right upper arm AV fistula       Basic Metabolic Panel:  Recent  Labs  Lab 02/24/18 1001 02/24/18 1814 02/25/18 0537  NA 136  --  136  K 7.1* 3.4* 5.0  CL 105  --  98*  CO2 19*  --  28  GLUCOSE 213*  --  126*  BUN 64*  --  40*  CREATININE 5.78*  --  5.00*  CALCIUM 8.8*  --  7.7*  PHOS  --  1.7*  --      CBC: Recent Labs  Lab 02/24/18 0746 02/25/18 0537  WBC 12.6* 6.1  HGB 12.4 9.5*  HCT 37.1 28.1*  MCV 93.4 93.5  PLT 198 145*      Lab Results  Component Value Date   HEPBSAG Negative 12/25/2017   HEPBSAB Non Reactive 01/14/2017   HEPBIGM Negative 04/16/2016      Microbiology:  No results found for this or any previous visit (from the past 240 hour(s)).  Coagulation Studies: No results for input(s): LABPROT, INR in the last 72 hours.  Urinalysis: No results for input(s): COLORURINE, LABSPEC, PHURINE, GLUCOSEU, HGBUR, BILIRUBINUR, KETONESUR, PROTEINUR, UROBILINOGEN, NITRITE, LEUKOCYTESUR in the last 72 hours.  Invalid input(s): APPERANCEUR    Imaging: Ct Abdomen Pelvis W Contrast  Result Date: 02/24/2018 CLINICAL DATA:  Vomiting and diarrhea.  Diffuse abdominal pain. EXAM: CT ABDOMEN AND PELVIS WITH CONTRAST TECHNIQUE: Multidetector CT imaging of the abdomen and pelvis was performed using the standard protocol following bolus administration of intravenous contrast. CONTRAST:  76mL ISOVUE-300 IOPAMIDOL (ISOVUE-300) INJECTION 61% COMPARISON:  CT scan dated 12/23/2017 FINDINGS: Lower chest: Interval almost complete clearing of the infiltrate in the right lower lobe since the prior exam. Minimal residual haziness at that site. Heart size is normal. Small hiatal hernia. Hepatobiliary: No focal liver abnormality is seen. No gallstones, gallbladder wall thickening, or biliary dilatation. Pancreas: Unremarkable. No pancreatic ductal dilatation or surrounding inflammatory changes. Spleen: Normal in size without focal abnormality. Adrenals/Urinary Tract: Adrenal glands are unremarkable. Kidneys are normal, without renal calculi, focal  lesion, or hydronephrosis. Bladder is unremarkable. Stomach/Bowel: Stomach is within normal limits. Appendix appears normal. No evidence of bowel wall thickening, distention, or inflammatory changes. Vascular/Lymphatic: Aortic atherosclerosis. No enlarged abdominal or pelvic lymph nodes. Reproductive: Uterus and bilateral adnexa are unremarkable. Other: No abdominal wall hernia or abnormality. No abdominopelvic ascites. Musculoskeletal: No acute or significant osseous findings. IMPRESSION: 1. No acute abnormality of the abdomen or pelvis. 2. Almost complete clearing of the consolidative infiltrate seen in the posterior aspect of the right lower lobe on the prior CT scan 12/23/2017. Electronically Signed   By: Lorriane Shire M.D.   On: 02/24/2018 10:34     Medications:       Assessment/ Plan:  62 y.o. female with end-stage renal disease, diabetes with retinopathy, neuropathy admitted for evaluation and management of pneumonia  [facility] nephrology/TTS am shift  1.  Severe hyperkalemia 2.  End-stage renal disease 3.  Secondary hyperparathyroidism 4.  Anemia of chronic kidney disease, hemoglobin 12.4  Plan: Patient underwent urgent hemodialysis for correction of hyperkalemia yesterday  Volume removal with hemodialysis as tolerated Hemoglobin 12.4, hold ESA for now Patient will follow-up at outpatient unit tomorrow for  her routine hemodialysis    LOS: [place] 3/20/201910:55 AM  [place], [place]  Note: This note was prepared with Dragon dictation. Any transcription errors are unintentional

## 2018-02-25 NOTE — Care Management (Signed)
[name] dialysis liaison notified of discharge.  Patient follows up with [facility].  No new scripts for discharge.

## 2018-02-25 NOTE — Discharge Summary (Signed)
[facility] NAME: Anita Contreras    MR#:  [id number]  DATE OF BIRTH:  Feb 16, 1956  DATE OF ADMISSION:  02/24/2018 ADMITTING PHYSICIAN: Dustin Flock, MD  DATE OF DISCHARGE: 02/25/2018  PRIMARY CARE PHYSICIAN: Frazier Richards, MD    ADMISSION DIAGNOSIS:  Diarrhea of presumed infectious origin [R19.7] Hyperkalemia [E87.5]  DISCHARGE DIAGNOSIS:  Active Problems:   Hyperkalemia   SECONDARY DIAGNOSIS:   Past Medical History:  Diagnosis Date  . Acute cystitis with hematuria   . Anemia   . Cataracts, bilateral   . CKD (chronic kidney disease)   . Diabetic retinopathy, background ([place])   . Dialysis patient ([place])    Tues, Thurs, Sat  . Dizziness   . Hyperlipidemia   . Hypertension   . Migraine variant   . Neuropathy   . OP (osteoporosis)   . Personal history of noncompliance with medical treatment, presenting hazards to health   . Steal syndrome as complication of dialysis access ([place])   . Stroke ([place])   . Type II diabetes mellitus with ophthalmic manifestations, uncontrolled ([place])   . Vitamin D deficiency     HOSPITAL COURSE:   62 year old female with history of end-stage renal hemodialysis and essential hypertension who presented with nausea and vomiting found to have hyperkalemia.  1.  Hyperkalemia with EKG changes: Patient went for  dialysis.  Potassium level has improved.  2.  End-stage renal disease and he would also: Patient will continue dialysis Tuesday, Thursday and Saturday.  3.  Nausea vomiting diarrhea: All of her symptoms have subsided.  4.  Diabetes: Patient will continue outpatient regimen with ADA diet  5.  Essential hypertension: Continue Norvasc and Cozaar   DISCHARGE CONDITIONS AND DIET:   Stable for discharge on renal diabetic diet  CONSULTS OBTAINED:    DRUG ALLERGIES:   Allergies  Allergen Reactions  . Propofol Itching    Pt says " when getting anesthesia by body itches"  unsure what type of anesthesia    DISCHARGE MEDICATIONS:   Allergies as of 02/25/2018      Reactions   Propofol Itching   Pt says " when getting anesthesia by body itches" unsure what type of anesthesia      Medication List    TAKE these medications   acetaminophen 500 MG tablet Commonly known as:  TYLENOL Take 1,000 mg by mouth every 6 (six) hours as needed for mild pain or headache.   amLODipine 10 MG tablet Commonly known as:  NORVASC Take 1 tablet (10 mg total) by mouth daily.   aspirin 81 MG EC tablet TAKE 1 TABLET BY MOUTH EVERY DAY   atorvastatin 40 MG tablet Commonly known as:  LIPITOR Take 40 mg by mouth daily at 6 PM.   calcium acetate 667 MG capsule Commonly known as:  PHOSLO Take 1 capsule (667 mg total) by mouth 3 (three) times daily with meals.   docusate sodium 100 MG capsule Commonly known as:  COLACE Take 100 mg by mouth 2 (two) times daily.   feeding supplement (NEPRO CARB STEADY) Liqd Take 237 mLs by mouth 2 (two) times daily between meals.   furosemide 20 MG tablet Commonly known as:  LASIX Take 20 mg by mouth daily.   hydrALAZINE 50 MG tablet Commonly known as:  APRESOLINE Take 1 tablet (50 mg total) by mouth 3 (three) times daily.   hydrOXYzine 25 MG tablet Commonly known as:  ATARAX/VISTARIL Take 25 mg by mouth  2 (two) times daily as needed for itching.   insulin NPH-regular Human (70-30) 100 UNIT/ML injection Commonly known as:  NOVOLIN 70/30 Inject 6 Units into the skin 2 (two) times daily with a meal.   lidocaine-prilocaine cream Commonly known as:  EMLA Apply 1 application topically as needed.   losartan 50 MG tablet Commonly known as:  COZAAR Take 1 tablet (50 mg total) by mouth daily.   sodium bicarbonate 650 MG tablet Take 1,300 mg by mouth 2 (two) times daily.         Today   CHIEF COMPLAINT:  No longer nausea, vomiting or diarrhea   VITAL SIGNS:  Blood pressure (!) 144/56, pulse 91, temperature 99 F (37.2  C), temperature source Oral, resp. rate 20, height 5' (1.524 m), weight 47.6 kg (104 lb 15 oz), last menstrual period 09/19/2001, SpO2 100 %.   REVIEW OF SYSTEMS:  Review of Systems  Constitutional: Negative.  Negative for chills, fever and malaise/fatigue.  HENT: Negative.  Negative for ear discharge, ear pain, hearing loss, nosebleeds and sore throat.   Eyes: Negative.  Negative for blurred vision and pain.  Respiratory: Negative.  Negative for cough, hemoptysis, shortness of breath and wheezing.   Cardiovascular: Negative.  Negative for chest pain, palpitations and leg swelling.  Gastrointestinal: Negative.  Negative for abdominal pain, blood in stool, diarrhea, nausea and vomiting.  Genitourinary: Negative.  Negative for dysuria.  Musculoskeletal: Negative.  Negative for back pain.  Skin: Negative.   Neurological: Negative for dizziness, tremors, speech change, focal weakness, seizures and headaches.  Endo/Heme/Allergies: Negative.  Does not bruise/bleed easily.  Psychiatric/Behavioral: Negative.  Negative for depression, hallucinations and suicidal ideas.     PHYSICAL EXAMINATION:  GENERAL:  62 y.o.-year-old patient lying in the bed with no acute distress.  NECK:  Supple, no jugular venous distention. No thyroid enlargement, no tenderness.  LUNGS: Normal breath sounds bilaterally, no wheezing, rales,rhonchi  No use of accessory muscles of respiration.  CARDIOVASCULAR: S1, S2 normal. No murmurs, rubs, or gallops.  ABDOMEN: Soft, non-tender, non-distended. Bowel sounds present. No organomegaly or mass.  EXTREMITIES: No pedal edema, cyanosis, or clubbing.  PSYCHIATRIC: The patient is alert and oriented x 3.  SKIN: No obvious rash, lesion, or ulcer.   DATA REVIEW:   CBC Recent Labs  Lab 02/25/18 0537  WBC 6.1  HGB 9.5*  HCT 28.1*  PLT 145*    Chemistries  Recent Labs  Lab 02/24/18 1001  02/25/18 0537  NA 136  --  136  K 7.1*   < > 5.0  CL 105  --  98*  CO2 19*  --   28  GLUCOSE 213*  --  126*  BUN 64*  --  40*  CREATININE 5.78*  --  5.00*  CALCIUM 8.8*  --  7.7*  AST 36  --   --   ALT 24  --   --   ALKPHOS 132*  --   --   BILITOT 0.8  --   --    < > = values in this interval not displayed.    Cardiac Enzymes No results for input(s): TROPONINI in the last 168 hours.  Microbiology Results  @MICRORSLT48 @  RADIOLOGY:  Ct Abdomen Pelvis W Contrast  Result Date: 02/24/2018 CLINICAL DATA:  Vomiting and diarrhea.  Diffuse abdominal pain. EXAM: CT ABDOMEN AND PELVIS WITH CONTRAST TECHNIQUE: Multidetector CT imaging of the abdomen and pelvis was performed using the standard protocol following bolus administration of intravenous contrast. CONTRAST:  7mL ISOVUE-300 IOPAMIDOL (ISOVUE-300) INJECTION 61% COMPARISON:  CT scan dated 12/23/2017 FINDINGS: Lower chest: Interval almost complete clearing of the infiltrate in the right lower lobe since the prior exam. Minimal residual haziness at that site. Heart size is normal. Small hiatal hernia. Hepatobiliary: No focal liver abnormality is seen. No gallstones, gallbladder wall thickening, or biliary dilatation. Pancreas: Unremarkable. No pancreatic ductal dilatation or surrounding inflammatory changes. Spleen: Normal in size without focal abnormality. Adrenals/Urinary Tract: Adrenal glands are unremarkable. Kidneys are normal, without renal calculi, focal lesion, or hydronephrosis. Bladder is unremarkable. Stomach/Bowel: Stomach is within normal limits. Appendix appears normal. No evidence of bowel wall thickening, distention, or inflammatory changes. Vascular/Lymphatic: Aortic atherosclerosis. No enlarged abdominal or pelvic lymph nodes. Reproductive: Uterus and bilateral adnexa are unremarkable. Other: No abdominal wall hernia or abnormality. No abdominopelvic ascites. Musculoskeletal: No acute or significant osseous findings. IMPRESSION: 1. No acute abnormality of the abdomen or pelvis. 2. Almost complete clearing of the  consolidative infiltrate seen in the posterior aspect of the right lower lobe on the prior CT scan 12/23/2017. Electronically Signed   By: Lorriane Shire M.D.   On: 02/24/2018 10:34      Allergies as of 02/25/2018      Reactions   Propofol Itching   Pt says " when getting anesthesia by body itches" unsure what type of anesthesia      Medication List    TAKE these medications   acetaminophen 500 MG tablet Commonly known as:  TYLENOL Take 1,000 mg by mouth every 6 (six) hours as needed for mild pain or headache.   amLODipine 10 MG tablet Commonly known as:  NORVASC Take 1 tablet (10 mg total) by mouth daily.   aspirin 81 MG EC tablet TAKE 1 TABLET BY MOUTH EVERY DAY   atorvastatin 40 MG tablet Commonly known as:  LIPITOR Take 40 mg by mouth daily at 6 PM.   calcium acetate 667 MG capsule Commonly known as:  PHOSLO Take 1 capsule (667 mg total) by mouth 3 (three) times daily with meals.   docusate sodium 100 MG capsule Commonly known as:  COLACE Take 100 mg by mouth 2 (two) times daily.   feeding supplement (NEPRO CARB STEADY) Liqd Take 237 mLs by mouth 2 (two) times daily between meals.   furosemide 20 MG tablet Commonly known as:  LASIX Take 20 mg by mouth daily.   hydrALAZINE 50 MG tablet Commonly known as:  APRESOLINE Take 1 tablet (50 mg total) by mouth 3 (three) times daily.   hydrOXYzine 25 MG tablet Commonly known as:  ATARAX/VISTARIL Take 25 mg by mouth 2 (two) times daily as needed for itching.   insulin NPH-regular Human (70-30) 100 UNIT/ML injection Commonly known as:  NOVOLIN 70/30 Inject 6 Units into the skin 2 (two) times daily with a meal.   lidocaine-prilocaine cream Commonly known as:  EMLA Apply 1 application topically as needed.   losartan 50 MG tablet Commonly known as:  COZAAR Take 1 tablet (50 mg total) by mouth daily.   sodium bicarbonate 650 MG tablet Take 1,300 mg by mouth 2 (two) times daily.           Management plans  discussed with the patient and she is in agreement. Stable for discharge home  Patient should follow up with pcp  CODE STATUS:     Code Status Orders  (From admission, onward)        Start     Ordered   02/24/18 1248  Full code  Continuous     02/24/18 1247    Code Status History    Date Active Date Inactive Code Status Order ID Comments User Context   01/26/2018 15:33 01/27/2018 22:12 Full Code [id number]  Bettey Costa, MD Inpatient   12/24/2017 01:31 12/25/2017 20:58 Full Code [id number]  Lance Coon, MD Inpatient   01/14/2017 09:25 01/17/2017 18:03 Full Code [id number]  Laverle Hobby, MD ED   11/08/2016 18:08 11/10/2016 17:54 Full Code [id number]  Algernon Huxley, MD Inpatient   10/17/2016 13:19 10/17/2016 19:24 Full Code [id number]  Algernon Huxley, MD Inpatient   04/23/2016 20:29 05/07/2016 20:01 Full Code [id number]  Vaughan Basta, MD Inpatient   04/15/2016 19:18 04/19/2016 16:08 Full Code [id number]  Gladstone Lighter, MD Inpatient      TOTAL TIME TAKING CARE OF THIS PATIENT: 38 minutes.    Note: This dictation was prepared with Dragon dictation along with smaller phrase technology. Any transcriptional errors that result from this process are unintentional.  Kanon Colunga M.D on 02/25/2018 at 10:22 AM  Between 7am to 6pm - Pager - [phone] After 6pm go to [email or web address] - password [email or web address] [facility]  Office  [phone]  CC: Primary care physician; Frazier Richards, MD

## 2018-02-25 NOTE — Progress Notes (Deleted)
Notified Dr. Leslye Peer of bladder scan results. Per MD do not hang bolus at this time.

## 2018-02-25 NOTE — Progress Notes (Signed)
Anita Contreras  A and O x 4. VSS. Pt tolerating diet well. No complaints of pain or nausea. IV removed intact, no new prescriptions given. Pt voiced understanding of discharge instructions with no further questions, interpreter used. Pt discharged via wheelchair with axillary.  Lynann Bologna MSN, RN-BC  Allergies as of 02/25/2018      Reactions   Propofol Itching   Pt says " when getting anesthesia by body itches" unsure what type of anesthesia      Medication List    TAKE these medications   acetaminophen 500 MG tablet Commonly known as:  TYLENOL Take 1,000 mg by mouth every 6 (six) hours as needed for mild pain or headache.   amLODipine 10 MG tablet Commonly known as:  NORVASC Take 1 tablet (10 mg total) by mouth daily.   aspirin 81 MG EC tablet TAKE 1 TABLET BY MOUTH EVERY DAY   atorvastatin 40 MG tablet Commonly known as:  LIPITOR Take 40 mg by mouth daily at 6 PM.   calcium acetate 667 MG capsule Commonly known as:  PHOSLO Take 1 capsule (667 mg total) by mouth 3 (three) times daily with meals.   docusate sodium 100 MG capsule Commonly known as:  COLACE Take 100 mg by mouth 2 (two) times daily.   feeding supplement (NEPRO CARB STEADY) Liqd Take 237 mLs by mouth 2 (two) times daily between meals.   furosemide 20 MG tablet Commonly known as:  LASIX Take 20 mg by mouth daily.   hydrALAZINE 50 MG tablet Commonly known as:  APRESOLINE Take 1 tablet (50 mg total) by mouth 3 (three) times daily.   hydrOXYzine 25 MG tablet Commonly known as:  ATARAX/VISTARIL Take 25 mg by mouth 2 (two) times daily as needed for itching.   insulin NPH-regular Human (70-30) 100 UNIT/ML injection Commonly known as:  NOVOLIN 70/30 Inject 6 Units into the skin 2 (two) times daily with a meal.   lidocaine-prilocaine cream Commonly known as:  EMLA Apply 1 application topically as needed.   losartan 50 MG tablet Commonly known as:  COZAAR Take 1 tablet (50 mg total) by mouth  daily.   sodium bicarbonate 650 MG tablet Take 1,300 mg by mouth 2 (two) times daily.       Vitals:   02/25/18 0516 02/25/18 1028  BP: (!) 144/56 (!) 155/64  Pulse: 91   Resp:    Temp:    SpO2:  97%

## 2018-05-18 ENCOUNTER — Encounter (INDEPENDENT_AMBULATORY_CARE_PROVIDER_SITE_OTHER): Payer: MEDICAID

## 2018-05-18 ENCOUNTER — Ambulatory Visit (INDEPENDENT_AMBULATORY_CARE_PROVIDER_SITE_OTHER): Payer: MEDICAID | Admitting: Vascular Surgery

## 2018-05-20 ENCOUNTER — Encounter (INDEPENDENT_AMBULATORY_CARE_PROVIDER_SITE_OTHER): Payer: Self-pay | Admitting: Vascular Surgery

## 2018-05-20 ENCOUNTER — Encounter (INDEPENDENT_AMBULATORY_CARE_PROVIDER_SITE_OTHER): Payer: Self-pay

## 2018-05-20 ENCOUNTER — Ambulatory Visit (INDEPENDENT_AMBULATORY_CARE_PROVIDER_SITE_OTHER): Payer: Self-pay | Admitting: Vascular Surgery

## 2018-05-20 ENCOUNTER — Ambulatory Visit (INDEPENDENT_AMBULATORY_CARE_PROVIDER_SITE_OTHER): Payer: Self-pay

## 2018-05-20 ENCOUNTER — Other Ambulatory Visit (INDEPENDENT_AMBULATORY_CARE_PROVIDER_SITE_OTHER): Payer: Self-pay | Admitting: Vascular Surgery

## 2018-05-20 VITALS — BP 186/74 | HR 68 | Resp 16 | Ht 60.0 in | Wt 107.4 lb

## 2018-05-20 DIAGNOSIS — N186 End stage renal disease: Secondary | ICD-10-CM

## 2018-05-20 DIAGNOSIS — Z95820 Peripheral vascular angioplasty status with implants and grafts: Secondary | ICD-10-CM

## 2018-05-20 DIAGNOSIS — T829XXS Unspecified complication of cardiac and vascular prosthetic device, implant and graft, sequela: Secondary | ICD-10-CM

## 2018-05-20 DIAGNOSIS — Z992 Dependence on renal dialysis: Secondary | ICD-10-CM

## 2018-05-20 NOTE — Progress Notes (Signed)
Subjective:    Patient ID: Anita Contreras, female    DOB: 12/19/1955, 62 y.o.   MRN: [id number] Chief Complaint  Patient presents with  . Follow-up    HDA results   Patient presents for her first post procedure follow-up.  The patient is status post a right upper extremity fistulogram with stent placement and PTA.  The patient notes a unremarkable post procedure course.  The patient notes continued difficulty with cannulation.  The patient underwent a right upper extremity HDA which was notable for a total flow volume of 738.  Biphasic antegrade flow in the right distal radial artery.  Patient is not complaining of any right hand pain.  There are 2 areas of hemodynamically significant velocity increases in the antecubital fossa and the proximal/mid upper arm levels of the outflow vein.  When compared to the previous study prior to her recent intervention there is a significant improvement in the confluence level velocities.  The patient denies any issues with the skin surrounding her fistula site.  The patient denies any fever, nausea vomiting.  Patient denies any uremic symptoms.   Review of Systems  Constitutional: Negative.   HENT: Negative.   Eyes: Negative.   Respiratory: Negative.   Cardiovascular: Negative.   Gastrointestinal: Negative.   Endocrine: Negative.   Genitourinary:       ESRD  Musculoskeletal: Negative.   Skin: Negative.   Allergic/Immunologic: Negative.   Neurological: Negative.   Hematological: Negative.   Psychiatric/Behavioral: Negative.       Objective:   Physical Exam  Constitutional: She is oriented to person, place, and time. She appears well-developed and well-nourished. No distress.  HENT:  Head: Normocephalic and atraumatic.  Right Ear: External ear normal.  Left Ear: External ear normal.  Eyes: Pupils are equal, round, and reactive to light. Conjunctivae and EOM are normal.  Neck: Normal range of motion.  Cardiovascular: Normal rate,  regular rhythm, normal heart sounds and intact distal pulses.  Pulses:      Radial pulses are 2+ on the right side, and 2+ on the left side.  Right upper extremity radiocephalic AV fistula: Good bruit and thrill.  Skin is intact.  Pulmonary/Chest: Effort normal and breath sounds normal.  Musculoskeletal: Normal range of motion. She exhibits no edema.  Neurological: She is alert and oriented to person, place, and time.  Skin: Skin is warm and dry. She is not diaphoretic.  Psychiatric: She has a normal mood and affect. Her behavior is normal. Judgment and thought content normal.  Vitals reviewed.  BP (!) 186/74 (BP Location: Left Arm)   Pulse 68   Resp 16   Ht 5' (1.524 m)   Wt 107 lb 6.4 oz (48.7 kg)   LMP 09/19/2001 (Approximate)   BMI 20.98 kg/m   Past Medical History:  Diagnosis Date  . Acute cystitis with hematuria   . Anemia   . Cataracts, bilateral   . CKD (chronic kidney disease)   . Diabetic retinopathy, background ([place])   . Dialysis patient ([place])    Tues, Thurs, Sat  . Dizziness   . Hyperlipidemia   . Hypertension   . Migraine variant   . Neuropathy   . OP (osteoporosis)   . Personal history of noncompliance with medical treatment, presenting hazards to health   . Steal syndrome as complication of dialysis access ([place])   . Stroke ([place])   . Type II diabetes mellitus with ophthalmic manifestations, uncontrolled ([place])   . Vitamin D deficiency  Social History   Socioeconomic History  . Marital status: Widowed    Spouse name: Not on file  . Number of children: Not on file  . Years of education: Not on file  . Highest education level: Not on file  Occupational History  . Not on file  Social Needs  . Financial resource strain: Not on file  . Food insecurity:    Worry: Not on file    Inability: Not on file  . Transportation needs:    Medical: Not on file    Non-medical: Not on file  Tobacco Use  . Smoking status: Never Smoker  . Smokeless tobacco: Never  Used  Substance and Sexual Activity  . Alcohol use: No  . Drug use: No  . Sexual activity: Never    Birth control/protection: Abstinence  Lifestyle  . Physical activity:    Days per week: Not on file    Minutes per session: Not on file  . Stress: Not on file  Relationships  . Social connections:    Talks on phone: Not on file    Gets together: Not on file    Attends religious service: Not on file    Active member of club or organization: Not on file    Attends meetings of clubs or organizations: Not on file    Relationship status: Not on file  . Intimate partner violence:    Fear of current or ex partner: Not on file    Emotionally abused: Not on file    Physically abused: Not on file    Forced sexual activity: Not on file  Other Topics Concern  . Not on file  Social History Narrative   Lives at home with family   Past Surgical History:  Procedure Laterality Date  . A/V SHUNT INTERVENTION N/A 01/26/2018   Procedure: A/V SHUNT INTERVENTION;  Surgeon: Algernon Huxley, MD;  Location: [facility];  Service: Cardiovascular;  Laterality: N/A;  . A/V SHUNTOGRAM Right 01/26/2018   Procedure: A/V SHUNTOGRAM;  Surgeon: Algernon Huxley, MD;  Location: [facility];  Service: Cardiovascular;  Laterality: Right;  . AV FISTULA PLACEMENT Right 10/10/2016   Procedure: ARTERIOVENOUS (AV) FISTULA CREATION ( BRACHIOCEPHALIC );  Surgeon: Algernon Huxley, MD;  Location: [facility];  Service: Vascular;  Laterality: Right;  . DILATION AND CURETTAGE OF UTERUS    . DISTAL REVASCULARIZATION AND INTERVAL LIGATION (DRIL) Right 11/08/2016   Procedure: DISTAL REVASCULARIZATION AND INTERVAL LIGATION (DRIL);  Surgeon: Algernon Huxley, MD;  Location: [facility];  Service: Vascular;  Laterality: Right;  . PERIPHERAL VASCULAR CATHETERIZATION N/A 04/25/2016   Procedure: Dialysis/Perma Catheter Insertion;  Surgeon: Algernon Huxley, MD;  Location: [facility];  Service: Cardiovascular;  Laterality: N/A;  .  PERIPHERAL VASCULAR CATHETERIZATION Right 10/17/2016   Procedure: Upper Extremity Angiography;  Surgeon: Algernon Huxley, MD;  Location: [facility];  Service: Cardiovascular;  Laterality: Right;   Family History  Problem Relation Age of Onset  . Hypertension Mother   . Hyperlipidemia Mother    Allergies  Allergen Reactions  . Propofol Itching    Pt says " when getting anesthesia by body itches" unsure what type of anesthesia      Assessment & Plan:  Patient presents for her first post procedure follow-up.  The patient is status post a right upper extremity fistulogram with stent placement and PTA.  The patient notes a unremarkable post procedure course.  The patient notes continued difficulty  with cannulation.  The patient underwent a right upper extremity HDA which was notable for a total flow volume of 738.  Biphasic antegrade flow in the right distal radial artery.  Patient is not complaining of any right hand pain.  There are 2 areas of hemodynamically significant velocity increases in the antecubital fossa and the proximal/mid upper arm levels of the outflow vein.  When compared to the previous study prior to her recent intervention there is a significant improvement in the confluence level velocities.  The patient denies any issues with the skin surrounding her fistula site.  The patient denies any fever, nausea vomiting.  Patient denies any uremic symptoms.   1. ESRD on dialysis ([place]) - Stable Patient presents for her first post procedure follow-up. The patient reports continued issues with cannulation. There are 2 areas of significant velocity increases in the antecubital fossa and the proximal/mid upper arm levels of the outflow vein. Total flow volume is 738 Due to the patient's total flow volume into areas of significant narrowing worrisome for the possibility of decreased function/thrombosis of the fistula if they are not corrected Recommend a right upper extremity fistulogram  with possible intervention Procedure, risks and benefits explained to the patient All questions answered Patient wishes to proceed  Interpreter was present interpreting during this office visit.  2. Complication from renal dialysis device, sequela - Stable As above  Current Outpatient Medications on File Prior to Visit  Medication Sig Dispense Refill  . acetaminophen (TYLENOL) 500 MG tablet Take 1,000 mg by mouth every 6 (six) hours as needed for mild pain or headache.    Marland Kitchen amLODipine (NORVASC) 10 MG tablet Take 1 tablet (10 mg total) by mouth daily. 30 tablet 0  . aspirin 81 MG EC tablet TAKE 1 TABLET BY MOUTH EVERY DAY 150 tablet 0  . atorvastatin (LIPITOR) 40 MG tablet Take 40 mg by mouth daily at 6 PM.     . calcium acetate (PHOSLO) 667 MG capsule Take 1 capsule (667 mg total) by mouth 3 (three) times daily with meals. 90 capsule 0  . docusate sodium (COLACE) 100 MG capsule Take 100 mg by mouth 2 (two) times daily.    . furosemide (LASIX) 20 MG tablet Take 20 mg by mouth daily.    . hydrALAZINE (APRESOLINE) 50 MG tablet Take 1 tablet (50 mg total) by mouth 3 (three) times daily. 90 tablet 0  . hydrOXYzine (ATARAX/VISTARIL) 25 MG tablet Take 25 mg by mouth 2 (two) times daily as needed for itching.    . insulin NPH-regular Human (NOVOLIN 70/30) (70-30) 100 UNIT/ML injection Inject 6 Units into the skin 2 (two) times daily with a meal.     . lidocaine-prilocaine (EMLA) cream Apply 1 application topically as needed.    Marland Kitchen losartan (COZAAR) 50 MG tablet Take 1 tablet (50 mg total) by mouth daily. 30 tablet 0  . Nutritional Supplements (FEEDING SUPPLEMENT, NEPRO CARB STEADY,) LIQD Take 237 mLs by mouth 2 (two) times daily between meals. 60 Can 0  . sodium bicarbonate 650 MG tablet Take 1,300 mg by mouth 2 (two) times daily.     No current facility-administered medications on file prior to visit.    There are no Patient Instructions on file for this visit. No follow-ups on  file.  Mosi Hannold A Kaiden Dardis, PA-C

## 2018-05-27 ENCOUNTER — Other Ambulatory Visit (INDEPENDENT_AMBULATORY_CARE_PROVIDER_SITE_OTHER): Payer: Self-pay | Admitting: Vascular Surgery

## 2018-06-03 ENCOUNTER — Encounter: Payer: Self-pay | Admitting: Vascular Surgery

## 2018-06-03 ENCOUNTER — Encounter
Admission: RE | Disposition: A | Discharge status: Home / Self Care | Payer: Self-pay | Source: Ambulatory Visit | Attending: Vascular Surgery

## 2018-06-03 ENCOUNTER — Ambulatory Visit
Admission: RE | Admit: 2018-06-03 | Discharge: 2018-06-03 | Disposition: A | Discharge status: Home / Self Care | Payer: Self-pay | Source: Ambulatory Visit | Attending: Vascular Surgery | Admitting: Vascular Surgery

## 2018-06-03 DIAGNOSIS — I120 Hypertensive chronic kidney disease with stage 5 chronic kidney disease or end stage renal disease: Secondary | ICD-10-CM | POA: Insufficient documentation

## 2018-06-03 DIAGNOSIS — Y832 Surgical operation with anastomosis, bypass or graft as the cause of abnormal reaction of the patient, or of later complication, without mention of misadventure at the time of the procedure: Secondary | ICD-10-CM | POA: Insufficient documentation

## 2018-06-03 DIAGNOSIS — Z8249 Family history of ischemic heart disease and other diseases of the circulatory system: Secondary | ICD-10-CM | POA: Insufficient documentation

## 2018-06-03 DIAGNOSIS — T82868A Thrombosis of vascular prosthetic devices, implants and grafts, initial encounter: Secondary | ICD-10-CM

## 2018-06-03 DIAGNOSIS — Z8673 Personal history of transient ischemic attack (TIA), and cerebral infarction without residual deficits: Secondary | ICD-10-CM | POA: Insufficient documentation

## 2018-06-03 DIAGNOSIS — Z794 Long term (current) use of insulin: Secondary | ICD-10-CM | POA: Insufficient documentation

## 2018-06-03 DIAGNOSIS — Z7982 Long term (current) use of aspirin: Secondary | ICD-10-CM | POA: Insufficient documentation

## 2018-06-03 DIAGNOSIS — Z9889 Other specified postprocedural states: Secondary | ICD-10-CM | POA: Insufficient documentation

## 2018-06-03 DIAGNOSIS — N186 End stage renal disease: Secondary | ICD-10-CM

## 2018-06-03 DIAGNOSIS — Z79899 Other long term (current) drug therapy: Secondary | ICD-10-CM | POA: Insufficient documentation

## 2018-06-03 DIAGNOSIS — Z992 Dependence on renal dialysis: Secondary | ICD-10-CM | POA: Insufficient documentation

## 2018-06-03 DIAGNOSIS — E1122 Type 2 diabetes mellitus with diabetic chronic kidney disease: Secondary | ICD-10-CM | POA: Insufficient documentation

## 2018-06-03 DIAGNOSIS — T82858A Stenosis of vascular prosthetic devices, implants and grafts, initial encounter: Secondary | ICD-10-CM | POA: Insufficient documentation

## 2018-06-03 DIAGNOSIS — E11319 Type 2 diabetes mellitus with unspecified diabetic retinopathy without macular edema: Secondary | ICD-10-CM | POA: Insufficient documentation

## 2018-06-03 DIAGNOSIS — E1140 Type 2 diabetes mellitus with diabetic neuropathy, unspecified: Secondary | ICD-10-CM | POA: Insufficient documentation

## 2018-06-03 DIAGNOSIS — M810 Age-related osteoporosis without current pathological fracture: Secondary | ICD-10-CM | POA: Insufficient documentation

## 2018-06-03 DIAGNOSIS — H269 Unspecified cataract: Secondary | ICD-10-CM | POA: Insufficient documentation

## 2018-06-03 DIAGNOSIS — E785 Hyperlipidemia, unspecified: Secondary | ICD-10-CM | POA: Insufficient documentation

## 2018-06-03 DIAGNOSIS — Z888 Allergy status to other drugs, medicaments and biological substances status: Secondary | ICD-10-CM | POA: Insufficient documentation

## 2018-06-03 DIAGNOSIS — G43909 Migraine, unspecified, not intractable, without status migrainosus: Secondary | ICD-10-CM | POA: Insufficient documentation

## 2018-06-03 HISTORY — PX: A/V FISTULAGRAM: CATH118298

## 2018-06-03 LAB — POTASSIUM (ARMC VASCULAR LAB ONLY): Potassium (ARMC vascular lab): 4.9 (ref 3.5–5.1)

## 2018-06-03 LAB — GLUCOSE, CAPILLARY: Glucose-Capillary: 125 mg/dL — ABNORMAL HIGH (ref 70–99)

## 2018-06-03 SURGERY — A/V FISTULAGRAM
Anesthesia: Moderate Sedation | Laterality: Right

## 2018-06-03 MED ORDER — FENTANYL CITRATE (PF) 100 MCG/2ML IJ SOLN
INTRAMUSCULAR | Status: DC | PRN
  Administered 2018-06-03 (×2): 50 ug via INTRAVENOUS

## 2018-06-03 MED ORDER — HYDROMORPHONE HCL 1 MG/ML IJ SOLN: 1.0000 mg | Freq: Once | INTRAMUSCULAR | Status: DC | PRN

## 2018-06-03 MED ORDER — LIDOCAINE-EPINEPHRINE (PF) 1 %-1:200000 IJ SOLN
INTRAMUSCULAR | Status: AC
  Filled 2018-06-03: qty 30

## 2018-06-03 MED ORDER — CEFAZOLIN SODIUM-DEXTROSE 1-4 GM/50ML-% IV SOLN
INTRAVENOUS | Status: AC
  Administered 2018-06-03: 1 g via INTRAVENOUS
  Filled 2018-06-03: qty 50

## 2018-06-03 MED ORDER — FENTANYL CITRATE (PF) 100 MCG/2ML IJ SOLN
INTRAMUSCULAR | Status: AC
Start: 2018-06-03 — End: ?
  Filled 2018-06-03: qty 2

## 2018-06-03 MED ORDER — IOPAMIDOL (ISOVUE-300) INJECTION 61%
INTRAVENOUS | Status: DC | PRN
  Administered 2018-06-03: 30 mL via INTRA_ARTERIAL

## 2018-06-03 MED ORDER — FAMOTIDINE 20 MG PO TABS: 40.0000 mg | ORAL_TABLET | ORAL | Status: DC | PRN

## 2018-06-03 MED ORDER — HEPARIN SODIUM (PORCINE) 1000 UNIT/ML IJ SOLN
INTRAMUSCULAR | Status: DC | PRN
  Administered 2018-06-03: 3000 [IU] via INTRAVENOUS

## 2018-06-03 MED ORDER — METHYLPREDNISOLONE SODIUM SUCC 125 MG IJ SOLR: 125.0000 mg | INTRAMUSCULAR | Status: DC | PRN

## 2018-06-03 MED ORDER — MIDAZOLAM HCL 5 MG/5ML IJ SOLN
INTRAMUSCULAR | Status: AC
  Filled 2018-06-03: qty 5

## 2018-06-03 MED ORDER — FENTANYL CITRATE (PF) 100 MCG/2ML IJ SOLN
INTRAMUSCULAR | Status: AC
  Filled 2018-06-03: qty 2

## 2018-06-03 MED ORDER — HEPARIN (PORCINE) IN NACL 1000-0.9 UT/500ML-% IV SOLN
INTRAVENOUS | Status: AC
  Filled 2018-06-03: qty 1000

## 2018-06-03 MED ORDER — CEFAZOLIN SODIUM-DEXTROSE 1-4 GM/50ML-% IV SOLN
1.0000 g | Freq: Once | INTRAVENOUS | Status: AC
  Administered 2018-06-03: 1 g via INTRAVENOUS

## 2018-06-03 MED ORDER — ONDANSETRON HCL 4 MG/2ML IJ SOLN: 4.0000 mg | Freq: Four times a day (QID) | INTRAMUSCULAR | Status: DC | PRN

## 2018-06-03 MED ORDER — SODIUM CHLORIDE 0.9 % IV SOLN
INTRAVENOUS | Status: DC
  Administered 2018-06-03: 11:00:00 via INTRAVENOUS

## 2018-06-03 MED ORDER — HEPARIN SODIUM (PORCINE) 1000 UNIT/ML IJ SOLN
INTRAMUSCULAR | Status: AC
  Filled 2018-06-03: qty 1

## 2018-06-03 MED ORDER — MIDAZOLAM HCL 2 MG/2ML IJ SOLN
INTRAMUSCULAR | Status: DC | PRN
  Administered 2018-06-03: 1 mg via INTRAVENOUS
  Administered 2018-06-03: 2 mg via INTRAVENOUS

## 2018-06-03 SURGICAL SUPPLY — 10 items
BALLN DORADO 8X60X80 (BALLOONS) ×2
BALLN LUTONIX DCB 7X60X130 (BALLOONS) ×2
BALLOON DORADO 8X60X80 (BALLOONS) ×1 IMPLANT
BALLOON LUTONIX DCB 7X60X130 (BALLOONS) ×1 IMPLANT
CANNULA 5F STIFF (CANNULA) ×2 IMPLANT
DEVICE PRESTO INFLATION (MISCELLANEOUS) ×2 IMPLANT
PACK ANGIOGRAPHY (CUSTOM PROCEDURE TRAY) ×2 IMPLANT
SHEATH BRITE TIP 6FRX5.5 (SHEATH) ×2 IMPLANT
SUT MNCRL AB 4-0 PS2 18 (SUTURE) ×2 IMPLANT
WIRE MAGIC TOR.035 180C (WIRE) ×2 IMPLANT

## 2018-06-03 NOTE — Op Note (Signed)
[facility]    OPERATIVE NOTE   PROCEDURE: 1.   Right brachiocephalic arteriovenous fistula cannulation under ultrasound guidance 2.   Right arm fistulagram including central venogram 3.   Percutaneous transluminal angioplasty mid upper arm cephalic vein stenosis with 7 mm diameter by 6 cm length Lutonix drug-coated angioplasty balloon and 8 mm diameter by 6 cm length high-pressure angioplasty balloon 4.  Percutaneous transluminal angioplasty of proximal upper arm cephalic vein just distal to the previously placed stent at the cephalic vein subclavian vein confluence with 7 mm diameter by 6 cm length angioplasty balloon  PRE-OPERATIVE DIAGNOSIS: 1. ESRD 2. Poorly functional right brachiocephalic AVF  POST-OPERATIVE DIAGNOSIS: same as above   SURGEON: [name], MD  ANESTHESIA: local with MCS  ESTIMATED BLOOD LOSS: 5 cc  FINDING(S): 1. Anastomosis was widely patent.  Moderate stenosis in the cephalic vein between the anastomosis and the access site in the 50 to 60% range.  There was then a high-grade stenosis of about 90% in the mid upper arm cephalic vein possibly at the venous access site.  The vessel then normalized for several centimeters.  At the leading edge of the stent at the cephalic vein subclavian vein confluence there was a napkin ringlike lesion in the 70% range.  The remainder of the central venous circulation was widely patent.  SPECIMEN(S):  None  CONTRAST: 30 cc  FLUORO TIME: 1.1 minutes  MODERATE CONSCIOUS SEDATION TIME: Approximately 20 minutes with 3 mg of Versed and 100 mcg of Fentanyl   INDICATIONS: Anita Contreras is a 62 y.o. female who presents with malfunctioning right brachiocephalic arteriovenous fistula. The patient has prolonged bleeding. The patient is scheduled for right arm fistulagram.  The patient is aware the risks include but are not limited to: bleeding, infection, thrombosis of the cannulated access, and  possible anaphylactic reaction to the contrast.  The patient is aware of the risks of the procedure and elects to proceed forward.  DESCRIPTION: After full informed written consent was obtained, the patient was brought back to the angiography suite and placed supine upon the angiography table.  The patient was connected to monitoring equipment. Moderate conscious sedation was administered with a face to face encounter with the patient throughout the procedure with my supervision of the RN administering medicines and monitoring the patient's vital signs and mental status throughout from the start of the procedure until the patient was taken to the recovery room. The right arm was prepped and draped in the standard fashion for a percutaneous access intervention.  Under ultrasound guidance, the right brachiocephalic arteriovenous fistula was cannulated with a micropuncture needle under direct ultrasound guidance and a permanent image was performed.  The microwire was advanced into the fistula and the needle was exchanged for the a microsheath.  I then upsized to a 6 Fr Sheath and imaging was performed.  Hand injections were completed to image the access including the central venous system. This demonstrated  The anastomosis was widely patent.  Moderate stenosis in the cephalic vein between the anastomosis and the access site in the 50 to 60% range.  There was then a high-grade stenosis of about 90% in the mid upper arm cephalic vein possibly at the venous access site.  The vessel then normalized for several centimeters.  At the leading edge of the stent at the cephalic vein subclavian vein confluence there was a napkin ringlike lesion in the 70% range.  The remainder of the central venous circulation was widely patent.  Based on the images, this patient will need intervention to the mid upper arm cephalic vein stenosis and the more proximal arm cephalic vein stenosis. I then gave the patient 3000 units of  intravenous heparin.  I then crossed the stenosis with a Magic Tourqe wire.  Based on the imaging, a 7 mm x 6 cm Lutonix drug-coated unready angioplasty balloon was selected.  The balloon was centered around the mid upper arm cephalic vein stenosis and inflated to 14 ATM for 1 minute(s).  The balloon was then deflated and advanced to the more proximal stenosis at the leading edge of the stent in the cephalic vein subclavian vein confluence.  It was inflated to 16 atm at this location.  On completion imaging, a 50-60 % residual stenosis was present in the mid upper arm cephalic vein but less than 10% residual stenosis was seen at the more proximal lesion at the leading edge of the stent in the cephalic vein subclavian vein confluence.  I then upsized to an 8 mm diameter by 6 cm length high-pressure angioplasty balloon for the mid upper arm cephalic vein stenosis.  The waist broke at 16 atm and this was held for 1 minute.  Completion imaging showed only about a 10% residual stenosis after this inflation.  I elected not to access in a retrograde fashion to treat the moderate stenosis closer to the anastomosis today, if she has further future.     Based on the completion imaging, no further intervention is necessary.  The wire and balloon were removed from the sheath.  A 4-0 Monocryl purse-string suture was sewn around the sheath.  The sheath was removed while tying down the suture.  A sterile bandage was applied to the puncture site.  COMPLICATIONS: None  CONDITION: Stable   [name]  06/03/2018 11:48 AM   This note was created with Dragon Medical transcription system. Any errors in dictation are purely unintentional.

## 2018-06-03 NOTE — H&P (Signed)
[facility] History & Physical Update  The patient was interviewed and re-examined.  The patient's previous History and Physical has been reviewed and is unchanged.  There is no change in the plan of care. We plan to proceed with the scheduled procedure.  [name], MD  06/03/2018, 10:58 AM

## 2018-06-04 ENCOUNTER — Telehealth (INDEPENDENT_AMBULATORY_CARE_PROVIDER_SITE_OTHER): Payer: Self-pay

## 2018-06-04 NOTE — Telephone Encounter (Signed)
Called back to let them know what was advised. They said that she was already gone for the day, and that they would remove it on Saturday.

## 2018-06-04 NOTE — Telephone Encounter (Signed)
Manuela Schwartz called and asked if the bandage over the access should be removed, and if she is supposed to take the suture out that's in place. She said that the patient usually brings instructions to her dialysis appointments, and this time she didn't have any follow-up instructions?

## 2018-06-08 ENCOUNTER — Encounter (INDEPENDENT_AMBULATORY_CARE_PROVIDER_SITE_OTHER): Payer: Self-pay | Admitting: Vascular Surgery

## 2018-06-08 ENCOUNTER — Ambulatory Visit (INDEPENDENT_AMBULATORY_CARE_PROVIDER_SITE_OTHER): Payer: Self-pay | Admitting: Vascular Surgery

## 2018-06-08 VITALS — BP 135/58 | HR 62 | Resp 14 | Ht <= 58 in | Wt 110.0 lb

## 2018-06-08 DIAGNOSIS — Z992 Dependence on renal dialysis: Secondary | ICD-10-CM

## 2018-06-08 DIAGNOSIS — N186 End stage renal disease: Secondary | ICD-10-CM

## 2018-06-08 NOTE — Progress Notes (Signed)
   Subjective:    Patient ID: Anita Contreras, female    DOB: March 17, 1956, 62 y.o.   MRN: [id number] Chief Complaint  Patient presents with  . Follow-up    Remove suture   Removed suture from right upper extremity fistula site from fistulogram with out complication. Steris applied. Good bruit and thrill. Skin intact.   BP (!) 135/58 (BP Location: Left Arm, Patient Position: Sitting)   Pulse 62   Resp 14   Ht 4\' 9"  (1.448 m)   Wt 110 lb (49.9 kg)   LMP 09/19/2001 (Approximate)   BMI 23.80 kg/m    There are no Patient Instructions on file for this visit. No follow-ups on file.   Andray Assefa A Maliq Pilley, PA-C

## 2018-07-10 ENCOUNTER — Encounter (INDEPENDENT_AMBULATORY_CARE_PROVIDER_SITE_OTHER): Payer: MEDICAID

## 2018-07-10 ENCOUNTER — Other Ambulatory Visit (INDEPENDENT_AMBULATORY_CARE_PROVIDER_SITE_OTHER): Payer: Self-pay | Admitting: Vascular Surgery

## 2018-07-10 ENCOUNTER — Ambulatory Visit (INDEPENDENT_AMBULATORY_CARE_PROVIDER_SITE_OTHER): Payer: MEDICAID | Admitting: Vascular Surgery

## 2018-07-10 DIAGNOSIS — N186 End stage renal disease: Secondary | ICD-10-CM

## 2018-07-29 ENCOUNTER — Encounter (INDEPENDENT_AMBULATORY_CARE_PROVIDER_SITE_OTHER): Payer: Self-pay | Admitting: Vascular Surgery

## 2018-09-30 ENCOUNTER — Encounter (INDEPENDENT_AMBULATORY_CARE_PROVIDER_SITE_OTHER): Payer: Self-pay

## 2018-10-11 ENCOUNTER — Other Ambulatory Visit (INDEPENDENT_AMBULATORY_CARE_PROVIDER_SITE_OTHER): Payer: Self-pay | Admitting: Nurse Practitioner

## 2018-10-11 MED ORDER — CEFAZOLIN SODIUM-DEXTROSE 1-4 GM/50ML-% IV SOLN: 1.0000 g | Freq: Once | INTRAVENOUS | Status: DC

## 2018-10-12 ENCOUNTER — Ambulatory Visit
Admission: RE | Admit: 2018-10-12 | Discharge: 2018-10-12 | Disposition: A | Discharge status: Home / Self Care | Payer: Self-pay | Source: Ambulatory Visit | Attending: Vascular Surgery | Admitting: Vascular Surgery

## 2018-10-12 ENCOUNTER — Encounter
Admission: RE | Disposition: A | Discharge status: Home / Self Care | Payer: Self-pay | Source: Ambulatory Visit | Attending: Vascular Surgery

## 2018-10-12 ENCOUNTER — Other Ambulatory Visit: Payer: Self-pay

## 2018-10-12 DIAGNOSIS — E1122 Type 2 diabetes mellitus with diabetic chronic kidney disease: Secondary | ICD-10-CM | POA: Insufficient documentation

## 2018-10-12 DIAGNOSIS — T82898A Other specified complication of vascular prosthetic devices, implants and grafts, initial encounter: Secondary | ICD-10-CM | POA: Insufficient documentation

## 2018-10-12 DIAGNOSIS — E785 Hyperlipidemia, unspecified: Secondary | ICD-10-CM | POA: Insufficient documentation

## 2018-10-12 DIAGNOSIS — N186 End stage renal disease: Secondary | ICD-10-CM

## 2018-10-12 DIAGNOSIS — I120 Hypertensive chronic kidney disease with stage 5 chronic kidney disease or end stage renal disease: Secondary | ICD-10-CM

## 2018-10-12 DIAGNOSIS — Z992 Dependence on renal dialysis: Secondary | ICD-10-CM

## 2018-10-12 DIAGNOSIS — Z8673 Personal history of transient ischemic attack (TIA), and cerebral infarction without residual deficits: Secondary | ICD-10-CM | POA: Insufficient documentation

## 2018-10-12 DIAGNOSIS — E1140 Type 2 diabetes mellitus with diabetic neuropathy, unspecified: Secondary | ICD-10-CM | POA: Insufficient documentation

## 2018-10-12 DIAGNOSIS — Z8249 Family history of ischemic heart disease and other diseases of the circulatory system: Secondary | ICD-10-CM | POA: Insufficient documentation

## 2018-10-12 DIAGNOSIS — E11319 Type 2 diabetes mellitus with unspecified diabetic retinopathy without macular edema: Secondary | ICD-10-CM | POA: Insufficient documentation

## 2018-10-12 DIAGNOSIS — Y832 Surgical operation with anastomosis, bypass or graft as the cause of abnormal reaction of the patient, or of later complication, without mention of misadventure at the time of the procedure: Secondary | ICD-10-CM | POA: Insufficient documentation

## 2018-10-12 HISTORY — PX: UPPER EXTREMITY ANGIOGRAPHY: CATH118270

## 2018-10-12 LAB — GLUCOSE, CAPILLARY: Glucose-Capillary: 100 mg/dL — ABNORMAL HIGH (ref 70–99)

## 2018-10-12 LAB — POTASSIUM (ARMC VASCULAR LAB ONLY): Potassium (ARMC vascular lab): 4.4 (ref 3.5–5.1)

## 2018-10-12 SURGERY — UPPER EXTREMITY ANGIOGRAPHY
Anesthesia: Moderate Sedation | Laterality: Right

## 2018-10-12 MED ORDER — HYDROMORPHONE HCL 1 MG/ML IJ SOLN: 1.0000 mg | Freq: Once | INTRAMUSCULAR | Status: DC | PRN

## 2018-10-12 MED ORDER — FENTANYL CITRATE (PF) 100 MCG/2ML IJ SOLN
INTRAMUSCULAR | Status: DC | PRN
  Administered 2018-10-12: 50 ug via INTRAVENOUS
  Administered 2018-10-12: 25 ug via INTRAVENOUS

## 2018-10-12 MED ORDER — MIDAZOLAM HCL 5 MG/5ML IJ SOLN
INTRAMUSCULAR | Status: AC
  Filled 2018-10-12: qty 5

## 2018-10-12 MED ORDER — SODIUM CHLORIDE 0.9 % IV SOLN
INTRAVENOUS | Status: DC
  Administered 2018-10-12: 1000 mL via INTRAVENOUS

## 2018-10-12 MED ORDER — IOPAMIDOL (ISOVUE-300) INJECTION 61%
INTRAVENOUS | Status: DC | PRN
  Administered 2018-10-12: 50 mL via INTRA_ARTERIAL

## 2018-10-12 MED ORDER — ONDANSETRON HCL 4 MG/2ML IJ SOLN: 4.0000 mg | Freq: Four times a day (QID) | INTRAMUSCULAR | Status: DC | PRN

## 2018-10-12 MED ORDER — FENTANYL CITRATE (PF) 100 MCG/2ML IJ SOLN
INTRAMUSCULAR | Status: AC
  Filled 2018-10-12: qty 2

## 2018-10-12 MED ORDER — HEPARIN SODIUM (PORCINE) 1000 UNIT/ML IJ SOLN
INTRAMUSCULAR | Status: AC
  Filled 2018-10-12: qty 1

## 2018-10-12 MED ORDER — LABETALOL HCL 5 MG/ML IV SOLN
INTRAVENOUS | Status: AC
  Filled 2018-10-12: qty 4

## 2018-10-12 MED ORDER — HEPARIN (PORCINE) IN NACL 1000-0.9 UT/500ML-% IV SOLN
INTRAVENOUS | Status: AC
  Filled 2018-10-12: qty 500

## 2018-10-12 MED ORDER — CEFAZOLIN SODIUM-DEXTROSE 1-4 GM/50ML-% IV SOLN
INTRAVENOUS | Status: AC
  Administered 2018-10-12: 1 g
  Filled 2018-10-12: qty 50

## 2018-10-12 MED ORDER — LABETALOL HCL 5 MG/ML IV SOLN
INTRAVENOUS | Status: DC | PRN
  Administered 2018-10-12: 20 mg via INTRAVENOUS

## 2018-10-12 MED ORDER — MIDAZOLAM HCL 2 MG/2ML IJ SOLN
INTRAMUSCULAR | Status: DC | PRN
  Administered 2018-10-12 (×3): 1 mg via INTRAVENOUS

## 2018-10-12 MED ORDER — LIDOCAINE-EPINEPHRINE (PF) 1 %-1:200000 IJ SOLN
INTRAMUSCULAR | Status: AC
  Filled 2018-10-12: qty 30

## 2018-10-12 MED ORDER — HEPARIN SODIUM (PORCINE) 1000 UNIT/ML IJ SOLN
INTRAMUSCULAR | Status: DC | PRN
  Administered 2018-10-12: 3000 [IU] via INTRAVENOUS

## 2018-10-12 SURGICAL SUPPLY — 10 items
CATH ANGIO 5F 100CM .035 PIG (CATHETERS) ×3 IMPLANT
CATH BEACON 5 .035 100 H1 TIP (CATHETERS) ×3 IMPLANT
DEVICE PRESTO INFLATION (MISCELLANEOUS) IMPLANT
DEVICE STARCLOSE SE CLOSURE (Vascular Products) ×3 IMPLANT
DRAPE BRACHIAL (DRAPES) ×3 IMPLANT
PACK ANGIOGRAPHY (CUSTOM PROCEDURE TRAY) ×3 IMPLANT
SHEATH BRITE TIP 5FRX11 (SHEATH) ×3 IMPLANT
TUBING CONTRAST HIGH PRESS 72 (TUBING) ×3 IMPLANT
WIRE AQUATRACK .035X260CM (WIRE) ×3 IMPLANT
WIRE J 3MM .035X145CM (WIRE) ×3 IMPLANT

## 2018-10-12 NOTE — Op Note (Signed)
OPERATIVE REPORT   PREOPERATIVE DIAGNOSIS: 1. End-stage renal disease. 2. 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  POSTOPERATIVE DIAGNOSIS: Same as above  PROCEDURE PERFORMED: 1. Ultrasound guidance vascular access to right femoral artery. 2. Catheter placement to right ulnar artery  from right femoral approach. 3. Thoracic aortogram and selective right upper extremity angiogram 4. StarClose closure device right femoral artery.  SURGEON: Algernon Huxley, MD  ANESTHESIA: Local with moderate conscious sedation for 30 minutes using 3 mg of Versed and 75 Mcg of Fentanyl  BLOOD LOSS: Minimal.  FLUOROSCOPY TIME:6.8 minutes  INDICATION FOR PROCEDURE: This is a 62 y.o.female who presented to our office with steal syndrome. The patient's right brachiocephalic AV fistula is working well, but their hand is numb and painful. To further evaluate this to determine what options would be possible to treat the steal syndrome, angiogram of the left upper extremity is indicated. Risks and benefits are discussed. Informed consent was obtained.  DESCRIPTION OF PROCEDURE: The patient was brought to the vascular suite. Moderate conscious sedation was administered during a face to face encounter with the patient throughout the procedure with my supervision of the RN administering medicines and monitoring the patient's vital signs, pulse oximetry, telemetry and mental status throughout from the start of the procedure until the patient was taken to the recovery room.  Groins were shaved and prepped and sterile surgical field was created. The right femoral head was localized with fluoroscopy and the right femoral artery was then visualized with ultrasound and found to be widely patent. It was then accessed under direct ultrasound guidance without difficulty with a Seldinger needle and a permanent image was recorded. A J-wire and 5-French sheath  were then placed. Pigtail catheter was placed into the ascending aorta and a thoracic aortogram was then performed in the LAO projection. This demonstrated normal origins to the great vessels without significant proximal stenoses and a normal configuration of the great vessels. The patient was given 3000 units of intravenous heparin and a headhunter catheter was used to selectively cannulate the innominate artery.  And then the right subclavian artery without difficulty. This was then sequentially advanced to the brachial artery and to the brachial bifurcation.  Some degree of steal was demonstrated with the majority of the flow from the artery going into the fistula and not downstream to the hand on images with the catheter proximal to the access. I then advanced a catheter beyond the access and into the ulnar artery.  On selective injection, there was no significant stenosis within the ulnar artery but it was very small.  There was a patent palmar arch and then there was some retrograde flow down the radial artery although this did not pass the mid upper arm.  The origin of the radial artery was never really seen.  There appeared to be an early bifurcation in the axillary artery of the radial and ulnar arteries, but on selective injection of what would have been the radial artery, this was not continuous past the elbow.  Images distal to the elbow at the typical location of the radial artery takeoff showed no obvious radial artery flow.  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  It did not have any focal stenosis amenable to treatment.  The steal was not absolute and some flow did go to the hand even with the catheter proximal to the access.  Within the AV fistula, there were  actually 2 areas of greater than 70% stenosis in the cephalic vein.  One was in the distal upper arm and one was in the proximal to mid upper arm.  The stent at the cephalic vein  subclavian vein confluence was widely patent.  I elected not to treat the stenosis within the AV fistula as that would have worsened the steal symptoms to the right hand.  At this point, there was no role for any further endovascular therapy. The diagnostic catheter was removed. Oblique arteriogram was performed of the right femoral artery and StarClose closure device deployed in the usual fashion with excellent hemostatic result. The patient tolerated the procedure well and was taken to the recovery room in stable condition.   [name] 10/12/2018 3:18 PM

## 2018-10-12 NOTE — H&P (Signed)
[facility] History & Physical Update  The patient was interviewed and re-examined.  The patient's previous History and Physical has been reviewed and is unchanged.  There is no change in the plan of care. We plan to proceed with the scheduled procedure.  [name], MD  10/12/2018, 1:43 PM

## 2018-10-12 NOTE — Progress Notes (Signed)
1710-Patient took home meds. When sat up in bed-Hydralazine 50 mg and losartin 50 mg p.o.

## 2018-10-12 NOTE — Progress Notes (Signed)
Patient dialyses at [facility] on Tuesday, [place] and Saturday.

## 2018-10-12 NOTE — H&P (Signed)
[facility] Admission History & Physical  MRN : [id number]  Anita Contreras is a 62 y.o. (Oct 30, 1956) female who presents with chief complaint of No chief complaint on file. Marland Kitchen  History of Present Illness: Patient is sent over from the dialysis center for evaluation of steal symptoms in the right arm.  She has a long-standing access in the right arm that has required multiple previous interventions  She is now complaining of right hand and arm pain.  This is worse with dialysis.  No open ulcerations or infection.  Concern from the dialysis center was for steal syndrome so she is sent in for evaluation of this.  Current Facility-Administered Medications  Medication Dose Route Frequency Provider Last Rate Last Dose  . 0.9 %  sodium chloride infusion   Intravenous Continuous Eulogio Ditch E, NP      . ceFAZolin (ANCEF) IVPB 1 g/50 mL premix  1 g Intravenous Once Eulogio Ditch E, NP      . HYDROmorphone (DILAUDID) injection 1 mg  1 mg Intravenous Once PRN Kris Hartmann, NP      . ondansetron (ZOFRAN) injection 4 mg  4 mg Intravenous Q6H PRN Kris Hartmann, NP        Past Medical History:  Diagnosis Date  . Acute cystitis with hematuria   . Anemia   . Cataracts, bilateral   . CKD (chronic kidney disease)   . Diabetic retinopathy, background ([place])   . Dialysis patient ([place])    Tues, Thurs, Sat  . Dizziness   . Hyperlipidemia   . Hypertension   . Migraine variant   . Neuropathy   . OP (osteoporosis)   . Personal history of noncompliance with medical treatment, presenting hazards to health   . Steal syndrome as complication of dialysis access ([place])   . Stroke ([place])   . Type II diabetes mellitus with ophthalmic manifestations, uncontrolled ([place])   . Vitamin D deficiency     Past Surgical History:  Procedure Laterality Date  . A/V FISTULAGRAM Right 06/03/2018   Procedure: A/V FISTULAGRAM;  Surgeon: Algernon Huxley, MD;  Location: [facility];   Service: Cardiovascular;  Laterality: Right;  . A/V SHUNT INTERVENTION N/A 01/26/2018   Procedure: A/V SHUNT INTERVENTION;  Surgeon: Algernon Huxley, MD;  Location: [facility];  Service: Cardiovascular;  Laterality: N/A;  . A/V SHUNTOGRAM Right 01/26/2018   Procedure: A/V SHUNTOGRAM;  Surgeon: Algernon Huxley, MD;  Location: [facility];  Service: Cardiovascular;  Laterality: Right;  . AV FISTULA PLACEMENT Right 10/10/2016   Procedure: ARTERIOVENOUS (AV) FISTULA CREATION ( BRACHIOCEPHALIC );  Surgeon: Algernon Huxley, MD;  Location: [facility];  Service: Vascular;  Laterality: Right;  . DILATION AND CURETTAGE OF UTERUS    . DISTAL REVASCULARIZATION AND INTERVAL LIGATION (DRIL) Right 11/08/2016   Procedure: DISTAL REVASCULARIZATION AND INTERVAL LIGATION (DRIL);  Surgeon: Algernon Huxley, MD;  Location: [facility];  Service: Vascular;  Laterality: Right;  . PERIPHERAL VASCULAR CATHETERIZATION N/A 04/25/2016   Procedure: Dialysis/Perma Catheter Insertion;  Surgeon: Algernon Huxley, MD;  Location: [facility];  Service: Cardiovascular;  Laterality: N/A;  . PERIPHERAL VASCULAR CATHETERIZATION Right 10/17/2016   Procedure: Upper Extremity Angiography;  Surgeon: Algernon Huxley, MD;  Location: [facility];  Service: Cardiovascular;  Laterality: Right;    Social History Social History   Tobacco Use  . Smoking status: Never Smoker  . Smokeless tobacco: Never Used  Substance  Use Topics  . Alcohol use: No  . Drug use: No    Family History Family History  Problem Relation Age of Onset  . Hypertension Mother   . Hyperlipidemia Mother   no bleeding or clotting disorders  Allergies  Allergen Reactions  . Propofol Itching    Pt says " when getting anesthesia by body itches" unsure what type of anesthesia     REVIEW OF SYSTEMS (Negative unless checked)  Constitutional: [x] Weight loss  [] Fever  [] Chills Cardiac: [] Chest pain   [] Chest pressure   [] Palpitations   [] Shortness of breath  when laying flat   [] Shortness of breath at rest   [] Shortness of breath with exertion. Vascular:  [x] Pain in legs with walking   [] Pain in legs at rest   [] Pain in legs when laying flat   [] Claudication   [] Pain in feet when walking  [] Pain in feet at rest  [] Pain in feet when laying flat   [] History of DVT   [] Phlebitis   [] Swelling in legs   [] Varicose veins   [] Non-healing ulcers Pulmonary:   [] Uses home oxygen   [] Productive cough   [] Hemoptysis   [] Wheeze  [] COPD   [] Asthma Neurologic:  [] Dizziness  [] Blackouts   [] Seizures   [] History of stroke   [] History of TIA  [] Aphasia   [] Temporary blindness   [] Dysphagia   [x] Weakness or numbness in arms   [] Weakness or numbness in legs Musculoskeletal:  [x] Arthritis   [] Joint swelling   [] Joint pain   [] Low back pain Hematologic:  [] Easy bruising  [] Easy bleeding   [] Hypercoagulable state   [] Anemic  [] Hepatitis Gastrointestinal:  [] Blood in stool   [] Vomiting blood  [] Gastroesophageal reflux/heartburn   [] Difficulty swallowing. Genitourinary:  [x] Chronic kidney disease   [] Difficult urination  [] Frequent urination  [] Burning with urination   [] Blood in urine Skin:  [] Rashes   [] Ulcers   [] Wounds Psychological:  [] History of anxiety   []  History of major depression.  Physical Examination  Vitals:   10/12/18 1339  BP: (!) 198/78  Pulse: (!) 58  Resp: 17  Temp: 97.7 F (36.5 C)  TempSrc: Oral  SpO2: 99%   There is no height or weight on file to calculate BMI. Gen: WD/WN, NAD Head: [place]/AT, No temporalis wasting. Ear/Nose/Throat: Hearing grossly intact, nares w/o erythema or drainage, oropharynx w/o Erythema/Exudate,  Eyes: Conjunctiva clear, sclera non-icteric Neck: Trachea midline.  No JVD.  Pulmonary:  Good air movement, respirations not labored, no use of accessory muscles.  Cardiac: Irregular Vascular:  Vessel Right Left  Radial 1+ Palpable Palpable                                    Musculoskeletal: M/S 5/5 throughout.   Extremities without ischemic changes.  No deformity or atrophy.  Neurologic: Sensation grossly intact in extremities.  Symmetrical.  Speech is fluent. Motor exam as listed above. Psychiatric: Judgment intact, Mood & affect appropriate for pt's clinical situation. Dermatologic: No rashes or ulcers noted.  No cellulitis or open wounds.      CBC Lab Results  Component Value Date   WBC 6.1 02/25/2018   HGB 9.5 (L) 02/25/2018   HCT 28.1 (L) 02/25/2018   MCV 93.5 02/25/2018   PLT 145 (L) 02/25/2018    BMET    Component Value Date/Time   NA 136 02/25/2018 0537   K 5.0 02/25/2018 0537   CL 98 (L) 02/25/2018 7628  CO2 28 02/25/2018 0537   GLUCOSE 126 (H) 02/25/2018 0537   BUN 40 (H) 02/25/2018 0537   CREATININE 5.00 (H) 02/25/2018 0537   CALCIUM 7.7 (L) 02/25/2018 0537   GFRNONAA 9 (L) 02/25/2018 0537   GFRAA 10 (L) 02/25/2018 0537   CrCl cannot be calculated (Patient's most recent lab result is older than the maximum 21 days allowed.).  COAG Lab Results  Component Value Date   INR 0.95 10/28/2016   INR 0.93 09/09/2016    Radiology No results found.   Assessment/Plan 1.  End-stage renal disease with steal symptoms in the right arm.  Right upper extremity angiogram will be performed to evaluate for steal and potentially treat.  Risks and benefits were discussed with the patient and she is agreeable to proceed. 2.  Diabetes.  Stable on outpatient medications and likely an underlying cause of her renal insufficiency and blood glucose control important in reducing the progression of atherosclerotic disease. Also, involved in wound healing. On appropriate medications. 3.  Hypertension.  Likely an underlying cause of her renal insufficiency and blood pressure control important in reducing the progression of atherosclerotic disease. On appropriate oral medications.    [name], MD  10/12/2018 1:47 PM

## 2018-10-13 ENCOUNTER — Encounter: Payer: Self-pay | Admitting: Vascular Surgery

## 2018-11-09 ENCOUNTER — Encounter (INDEPENDENT_AMBULATORY_CARE_PROVIDER_SITE_OTHER): Payer: Self-pay | Admitting: Nurse Practitioner

## 2018-11-09 ENCOUNTER — Ambulatory Visit (INDEPENDENT_AMBULATORY_CARE_PROVIDER_SITE_OTHER): Payer: Self-pay | Admitting: Nurse Practitioner

## 2018-11-09 VITALS — BP 218/84 | HR 62 | Resp 16 | Wt 109.8 lb

## 2018-11-09 DIAGNOSIS — N186 End stage renal disease: Secondary | ICD-10-CM

## 2018-11-09 DIAGNOSIS — E785 Hyperlipidemia, unspecified: Secondary | ICD-10-CM

## 2018-11-09 DIAGNOSIS — I10 Essential (primary) hypertension: Secondary | ICD-10-CM

## 2018-11-09 DIAGNOSIS — Z992 Dependence on renal dialysis: Secondary | ICD-10-CM

## 2018-11-09 DIAGNOSIS — T82898D Other specified complication of vascular prosthetic devices, implants and grafts, subsequent encounter: Secondary | ICD-10-CM

## 2018-11-09 NOTE — Progress Notes (Signed)
Subjective:    Patient ID: Anita Contreras, female    DOB: 1956/08/01, 62 y.o.   MRN: [id number] Chief Complaint  Patient presents with  . Follow-up    [facility] 4week     HPI  Anita Contreras is a 62 y.o. female presents today for follow-up following a left upper extremity angiogram for steal syndrome of her left brachiocephalic AV fistula.  The patient states that since her angiograms she has not had any pain of her left lower extremity, however she does have numbness during dialysis.  She states that the numbness is tolerable as long as she wears a glove and places her hand on a pillow.  She also complains of cramping during dialysis in her legs as well as abdomen.  She states that previously before angiogram she was unable to maintain motor function of her hand to things such as making tortillas.  She states that now she has full function of her hand and there are no issues with doing her every day activities.  She denies any issues while on dialysis other than the numbness of her hand.  The patient denies any fever, chills, nausea, vomiting or diarrhea while on dialysis.  The patient denies any chest pain or shortness of breath.  Patient denies any long hold times following dialysis.  She denies any issues with cannulation.  This visit was conducted with an interpreter present.   Past Medical History:  Diagnosis Date  . Acute cystitis with hematuria   . Anemia   . Cataracts, bilateral   . CKD (chronic kidney disease)   . Diabetic retinopathy, background ([place])   . Dialysis patient ([place])    Tues, Thurs, Sat  . Dizziness   . Hyperlipidemia   . Hypertension   . Migraine variant   . Neuropathy   . OP (osteoporosis)   . Personal history of noncompliance with medical treatment, presenting hazards to health   . Steal syndrome as complication of dialysis access ([place])   . Stroke ([place])   . Type II diabetes mellitus with ophthalmic manifestations, uncontrolled ([place])   .  Vitamin D deficiency     Past Surgical History:  Procedure Laterality Date  . A/V FISTULAGRAM Right 06/03/2018   Procedure: A/V FISTULAGRAM;  Surgeon: Algernon Huxley, MD;  Location: [place] [facility];  Service: Cardiovascular;  Laterality: Right;  . A/V SHUNT INTERVENTION N/A 01/26/2018   Procedure: A/V SHUNT INTERVENTION;  Surgeon: Algernon Huxley, MD;  Location: [place] [facility];  Service: Cardiovascular;  Laterality: N/A;  . A/V SHUNTOGRAM Right 01/26/2018   Procedure: A/V SHUNTOGRAM;  Surgeon: Algernon Huxley, MD;  Location: [place] [facility];  Service: Cardiovascular;  Laterality: Right;  . AV FISTULA PLACEMENT Right 10/10/2016   Procedure: ARTERIOVENOUS (AV) FISTULA CREATION ( BRACHIOCEPHALIC );  Surgeon: Algernon Huxley, MD;  Location: [facility];  Service: Vascular;  Laterality: Right;  . DILATION AND CURETTAGE OF UTERUS    . DISTAL REVASCULARIZATION AND INTERVAL LIGATION (DRIL) Right 11/08/2016   Procedure: DISTAL REVASCULARIZATION AND INTERVAL LIGATION (DRIL);  Surgeon: Algernon Huxley, MD;  Location: [facility];  Service: Vascular;  Laterality: Right;  . PERIPHERAL VASCULAR CATHETERIZATION N/A 04/25/2016   Procedure: Dialysis/Perma Catheter Insertion;  Surgeon: Algernon Huxley, MD;  Location: [place] [facility];  Service: Cardiovascular;  Laterality: N/A;  . PERIPHERAL VASCULAR CATHETERIZATION Right 10/17/2016   Procedure: Upper Extremity Angiography;  Surgeon: Algernon Huxley, MD;  Location: [place] [facility];  Service: Cardiovascular;  Laterality: Right;  . UPPER EXTREMITY ANGIOGRAPHY Right 10/12/2018   Procedure: UPPER EXTREMITY ANGIOGRAPHY;  Surgeon: Algernon Huxley, MD;  Location: [place] [facility];  Service: Cardiovascular;  Laterality: Right;    Social History   Socioeconomic History  . Marital status: Widowed    Spouse name: Not on file  . Number of children: Not on file  . Years of education: Not on file  . Highest education level: Not on file  Occupational History  . Not on file    Social Needs  . Financial resource strain: Not on file  . Food insecurity:    Worry: Not on file    Inability: Not on file  . Transportation needs:    Medical: Not on file    Non-medical: Not on file  Tobacco Use  . Smoking status: Never Smoker  . Smokeless tobacco: Never Used  Substance and Sexual Activity  . Alcohol use: No  . Drug use: No  . Sexual activity: Never    Birth control/protection: Abstinence  Lifestyle  . Physical activity:    Days per week: Not on file    Minutes per session: Not on file  . Stress: Not on file  Relationships  . Social connections:    Talks on phone: Not on file    Gets together: Not on file    Attends religious service: Not on file    Active member of club or organization: Not on file    Attends meetings of clubs or organizations: Not on file    Relationship status: Not on file  . Intimate partner violence:    Fear of current or ex partner: Not on file    Emotionally abused: Not on file    Physically abused: Not on file    Forced sexual activity: Not on file  Other Topics Concern  . Not on file  Social History Narrative   Lives at home with family    Family History  Problem Relation Age of Onset  . Hypertension Mother   . Hyperlipidemia Mother     Allergies  Allergen Reactions  . Propofol Itching    Pt says " when getting anesthesia by body itches" unsure what type of anesthesia     Review of Systems   Review of Systems: Negative Unless Checked Constitutional: [] Weight loss  [] Fever  [] Chills Cardiac: [] Chest pain   []  Atrial Fibrillation  [] Palpitations   [] Shortness of breath when laying flat   [] Shortness of breath with exertion. Vascular:  [] Pain in legs with walking   [] Pain in legs with standing  [] History of DVT   [] Phlebitis   [] Swelling in legs   [] Varicose veins   [] Non-healing ulcers Pulmonary:   [] Uses home oxygen   [] Productive cough   [] Hemoptysis   [] Wheeze  [] COPD   [] Asthma Neurologic:  [] Dizziness    [] Seizures   [] History of stroke   [] History of TIA  [] Aphasia   [] Vissual changes   [] Weakness or numbness in arm   [] Weakness or numbness in leg Musculoskeletal:   [] Joint swelling   [] Joint pain   [] Low back pain  []  History of Knee Replacement Hematologic:  [] Easy bruising  [] Easy bleeding   [] Hypercoagulable state   [] Anemic Gastrointestinal:  [] Diarrhea   [] Vomiting  [] Gastroesophageal reflux/heartburn   [] Difficulty swallowing. Genitourinary:  [x] Chronic kidney disease   [] Difficult urination  [] Anuric   [] Blood in urine Skin:  [] Rashes   [] Ulcers  Psychological:  [] History of anxiety   []  History  of major depression  []  Memory Difficulties     Objective:   Physical Exam  BP (!) 218/84 (BP Location: Left Arm)   Pulse 62   Resp 16   Wt 109 lb 12.8 oz (49.8 kg)   LMP 09/19/2001 (Approximate)   BMI 23.76 kg/m   Gen: WD/WN, NAD Head: [place]/AT, No temporalis wasting.  Ear/Nose/Throat: Hearing grossly intact, nares w/o erythema or drainage Eyes: PER, EOMI, sclera nonicteric.  Neck: Supple, no masses.  No JVD.  Pulmonary:  Good air movement, no use of accessory muscles.  Cardiac: RRR Vascular:  Good thrill and bruit Vessel Right Left  Radial   palpable  not palpable  Ulnar Palpable Palpable   Gastrointestinal: soft, non-distended. No guarding/no peritoneal signs.  Musculoskeletal: M/S 5/5 throughout.  No deformity or atrophy.  Neurologic: Pain and light touch intact in extremities.  Symmetrical.  Speech is fluent. Motor exam as listed above. Psychiatric: Judgment intact, Mood & affect appropriate for pt's clinical situation. Dermatologic: No Venous rashes. No Ulcers Noted.  No changes consistent with cellulitis. Lymph : No Cervical lymphadenopathy, no lichenification or skin changes of chronic lymphedema.      Assessment & Plan:   1. ESRD on dialysis [facility])  Recommend:  The patient is doing well and currently has adequate dialysis access. The patient's dialysis center is  not reporting any access issues. Flow pattern is stable when compared to the prior ultrasound.  The patient should have a duplex ultrasound of the dialysis access in 6 months. The patient will follow-up with me in the office after each ultrasound   - VAS [place] (AVF, AVG); Future  2. Steal syndrome as complication of dialysis access, subsequent encounter The patient still has evidence of steal syndrome during dialysis.  She states that she has some numbness of her hand during dialysis however soon as it is stopped the numbness goes away.  She states that prior to intervention she had a constant numbness and she was unable to move her hand.  Now, she has no issues with hand strength or hand movement.  Her situation is complicated in that her current dialysis access has some areas of stenosis, which will worsen the steal syndrome if they are intervened on.  Repair of her steal syndrome would require either banding, ligation, or a drill procedure, all of which would necessitate several steps as well as multiple procedures.  This was discussed with the patient and it was decided that at this time we would maintain a conservative approach and continue to monitor her AV fistula and plan appropriate interventions when need arises.  Patient understood and agree.  3. Essential hypertension Continue antihypertensive medications as already ordered, these medications have been reviewed and there are no changes at this time.   4. Hyperlipidemia, unspecified hyperlipidemia type Continue statin as ordered and reviewed, no changes at this time    Current Outpatient Medications on File Prior to Visit  Medication Sig Dispense Refill  . acetaminophen (TYLENOL) 500 MG tablet Take 1,000 mg by mouth every 6 (six) hours as needed for mild pain or headache.    Marland Kitchen amLODipine (NORVASC) 10 MG tablet Take 1 tablet (10 mg total) by mouth daily. 30 tablet 0  . atorvastatin (LIPITOR) 40 MG tablet Take  40 mg by mouth daily at 6 PM.     . docusate sodium (COLACE) 100 MG capsule Take 100 mg by mouth 2 (two) times daily.    . furosemide (LASIX) 20 MG tablet  Take 20 mg by mouth daily.    Marland Kitchen gabapentin (NEURONTIN) 300 MG capsule Take by mouth.    . hydrALAZINE (APRESOLINE) 50 MG tablet Take 1 tablet (50 mg total) by mouth 3 (three) times daily. 90 tablet 0  . hydrOXYzine (ATARAX/VISTARIL) 25 MG tablet Take 25 mg by mouth 2 (two) times daily as needed for itching.    . insulin NPH-regular Human (NOVOLIN 70/30) (70-30) 100 UNIT/ML injection Inject 6 Units into the skin 2 (two) times daily with a meal.     . insulin regular (HUMULIN R) 100 units/mL injection Inject into the skin.    Marland Kitchen lidocaine-prilocaine (EMLA) cream Apply 1 application topically as needed.    Marland Kitchen lisinopril (PRINIVIL,ZESTRIL) 40 MG tablet Take by mouth.    . losartan (COZAAR) 50 MG tablet Take 1 tablet (50 mg total) by mouth daily. 30 tablet 0  . sodium bicarbonate 650 MG tablet Take 1,300 mg by mouth 2 (two) times daily.    Marland Kitchen aspirin 81 MG EC tablet TAKE 1 TABLET BY MOUTH EVERY DAY (Patient not taking: Reported on 10/12/2018) 150 tablet 0  . calcium acetate (PHOSLO) 667 MG capsule Take 1 capsule (667 mg total) by mouth 3 (three) times daily with meals. (Patient not taking: Reported on 10/12/2018) 90 capsule 0  . Nutritional Supplements (FEEDING SUPPLEMENT, NEPRO CARB STEADY,) LIQD Take 237 mLs by mouth 2 (two) times daily between meals. (Patient not taking: Reported on 10/12/2018) 60 Can 0   No current facility-administered medications on file prior to visit.     There are no Patient Instructions on file for this visit. No follow-ups on file.   Kris Hartmann, NP  This note was completed with Sales executive.  Any errors are purely unintentional.

## 2019-02-04 ENCOUNTER — Other Ambulatory Visit
Admission: RE | Admit: 2019-02-04 | Discharge: 2019-02-04 | Disposition: A | Discharge status: Home / Self Care | Payer: Self-pay | Source: Ambulatory Visit | Attending: Nephrology | Admitting: Nephrology

## 2019-02-04 DIAGNOSIS — N186 End stage renal disease: Secondary | ICD-10-CM | POA: Insufficient documentation

## 2019-02-04 LAB — POTASSIUM: POTASSIUM: 5.8 mmol/L — AB (ref 3.5–5.1)

## 2019-02-05 ENCOUNTER — Other Ambulatory Visit (INDEPENDENT_AMBULATORY_CARE_PROVIDER_SITE_OTHER): Payer: Self-pay | Admitting: Nurse Practitioner

## 2019-02-05 ENCOUNTER — Telehealth (INDEPENDENT_AMBULATORY_CARE_PROVIDER_SITE_OTHER): Payer: Self-pay

## 2019-02-05 NOTE — Telephone Encounter (Signed)
Dialysis called about patient having low access flow and sent over a fax to have the provider to recommend the next step

## 2019-02-10 ENCOUNTER — Other Ambulatory Visit: Payer: Self-pay

## 2019-02-10 ENCOUNTER — Encounter (INDEPENDENT_AMBULATORY_CARE_PROVIDER_SITE_OTHER): Payer: Self-pay

## 2019-02-10 ENCOUNTER — Ambulatory Visit (INDEPENDENT_AMBULATORY_CARE_PROVIDER_SITE_OTHER): Payer: Self-pay

## 2019-02-10 ENCOUNTER — Ambulatory Visit (INDEPENDENT_AMBULATORY_CARE_PROVIDER_SITE_OTHER): Payer: Self-pay | Admitting: Nurse Practitioner

## 2019-02-10 ENCOUNTER — Encounter (INDEPENDENT_AMBULATORY_CARE_PROVIDER_SITE_OTHER): Payer: Self-pay | Admitting: Nurse Practitioner

## 2019-02-10 ENCOUNTER — Telehealth (INDEPENDENT_AMBULATORY_CARE_PROVIDER_SITE_OTHER): Payer: Self-pay

## 2019-02-10 VITALS — BP 164/72 | HR 81 | Resp 10 | Ht 59.0 in | Wt 106.0 lb

## 2019-02-10 DIAGNOSIS — Z992 Dependence on renal dialysis: Secondary | ICD-10-CM

## 2019-02-10 DIAGNOSIS — T829XXS Unspecified complication of cardiac and vascular prosthetic device, implant and graft, sequela: Secondary | ICD-10-CM

## 2019-02-10 DIAGNOSIS — Z79899 Other long term (current) drug therapy: Secondary | ICD-10-CM

## 2019-02-10 DIAGNOSIS — E785 Hyperlipidemia, unspecified: Secondary | ICD-10-CM

## 2019-02-10 DIAGNOSIS — N186 End stage renal disease: Secondary | ICD-10-CM

## 2019-02-10 DIAGNOSIS — I10 Essential (primary) hypertension: Secondary | ICD-10-CM

## 2019-02-10 NOTE — Progress Notes (Signed)
SUBJECTIVE:  Patient ID: Anita Contreras, female    DOB: May 09, 1956, 63 y.o.   MRN: [id number] No chief complaint on file.   HPI  Anita Contreras is a 63 y.o. female The patient returns to the office for follow up regarding problem with the dialysis access.  There are dialysis called to report low access flow rates.  The patient previously had issues with steal syndrome and it was elected not to treat a previous stenosis for fear of worsening steal syndrome.  Since that time patient denies any numbness, tingling, or pain of her hand.  The patient denies redness or swelling at the access site. The patient denies fever or chills at home or while on dialysis.  The patient denies amaurosis fugax or recent TIA symptoms. There are no recent neurological changes noted. The patient denies claudication symptoms or rest pain symptoms. The patient denies history of DVT, PE or superficial thrombophlebitis. The patient denies recent episodes of angina or shortness of breath.   Today the patient underwent hemodialysis duplex which revealed a flow volume of 534.  There are elevated velocities located within the proximal upper arm with a diameter of 0.11.  Previous studies done on 05/22/2018 reveal a flow volume of 738.  There was also stenosis within the proximal upper arm however the diameter was 0.27   Past Medical History:  Diagnosis Date  . Acute cystitis with hematuria   . Anemia   . Cataracts, bilateral   . CKD (chronic kidney disease)   . Diabetic retinopathy, background ([place])   . Dialysis patient ([place])    Tues, Thurs, Sat  . Dizziness   . Hyperlipidemia   . Hypertension   . Migraine variant   . Neuropathy   . OP (osteoporosis)   . Personal history of noncompliance with medical treatment, presenting hazards to health   . Steal syndrome as complication of dialysis access ([place])   . Stroke ([place])   . Type II diabetes mellitus with ophthalmic manifestations, uncontrolled  ([place])   . Vitamin D deficiency     Past Surgical History:  Procedure Laterality Date  . A/V FISTULAGRAM Right 06/03/2018   Procedure: A/V FISTULAGRAM;  Surgeon: Algernon Huxley, MD;  Location: [place] [facility];  Service: Cardiovascular;  Laterality: Right;  . A/V SHUNT INTERVENTION N/A 01/26/2018   Procedure: A/V SHUNT INTERVENTION;  Surgeon: Algernon Huxley, MD;  Location: [place] [facility];  Service: Cardiovascular;  Laterality: N/A;  . A/V SHUNTOGRAM Right 01/26/2018   Procedure: A/V SHUNTOGRAM;  Surgeon: Algernon Huxley, MD;  Location: [place] [facility];  Service: Cardiovascular;  Laterality: Right;  . AV FISTULA PLACEMENT Right 10/10/2016   Procedure: ARTERIOVENOUS (AV) FISTULA CREATION ( BRACHIOCEPHALIC );  Surgeon: Algernon Huxley, MD;  Location: [facility];  Service: Vascular;  Laterality: Right;  . DILATION AND CURETTAGE OF UTERUS    . DISTAL REVASCULARIZATION AND INTERVAL LIGATION (DRIL) Right 11/08/2016   Procedure: DISTAL REVASCULARIZATION AND INTERVAL LIGATION (DRIL);  Surgeon: Algernon Huxley, MD;  Location: [facility];  Service: Vascular;  Laterality: Right;  . PERIPHERAL VASCULAR CATHETERIZATION N/A 04/25/2016   Procedure: Dialysis/Perma Catheter Insertion;  Surgeon: Algernon Huxley, MD;  Location: [place] [facility];  Service: Cardiovascular;  Laterality: N/A;  . PERIPHERAL VASCULAR CATHETERIZATION Right 10/17/2016   Procedure: Upper Extremity Angiography;  Surgeon: Algernon Huxley, MD;  Location: [place] [facility];  Service: Cardiovascular;  Laterality: Right;  . UPPER EXTREMITY ANGIOGRAPHY Right 10/12/2018   Procedure:  UPPER EXTREMITY ANGIOGRAPHY;  Surgeon: Algernon Huxley, MD;  Location: [place] [facility];  Service: Cardiovascular;  Laterality: Right;    Social History   Socioeconomic History  . Marital status: Widowed    Spouse name: Not on file  . Number of children: Not on file  . Years of education: Not on file  . Highest education level: Not on file  Occupational History  . Not  on file  Social Needs  . Financial resource strain: Not on file  . Food insecurity:    Worry: Not on file    Inability: Not on file  . Transportation needs:    Medical: Not on file    Non-medical: Not on file  Tobacco Use  . Smoking status: Never Smoker  . Smokeless tobacco: Never Used  Substance and Sexual Activity  . Alcohol use: No  . Drug use: No  . Sexual activity: Never    Birth control/protection: Abstinence  Lifestyle  . Physical activity:    Days per week: Not on file    Minutes per session: Not on file  . Stress: Not on file  Relationships  . Social connections:    Talks on phone: Not on file    Gets together: Not on file    Attends religious service: Not on file    Active member of club or organization: Not on file    Attends meetings of clubs or organizations: Not on file    Relationship status: Not on file  . Intimate partner violence:    Fear of current or ex partner: Not on file    Emotionally abused: Not on file    Physically abused: Not on file    Forced sexual activity: Not on file  Other Topics Concern  . Not on file  Social History Narrative   Lives at home with family    Family History  Problem Relation Age of Onset  . Hypertension Mother   . Hyperlipidemia Mother     Allergies  Allergen Reactions  . Propofol Itching    Pt says " when getting anesthesia by body itches" unsure what type of anesthesia     Review of Systems   Review of Systems: Negative Unless Checked Constitutional: [] Weight loss  [] Fever  [] Chills Cardiac: [] Chest pain   []  Atrial Fibrillation  [] Palpitations   [] Shortness of breath when laying flat   [] Shortness of breath with exertion. [] Shortness of breath at rest Vascular:  [] Pain in legs with walking   [] Pain in legs with standing [] Pain in legs when laying flat   [] Claudication    [] Pain in feet when laying flat    [] History of DVT   [] Phlebitis   [] Swelling in legs   [] Varicose veins   [] Non-healing  ulcers Pulmonary:   [] Uses home oxygen   [] Productive cough   [] Hemoptysis   [] Wheeze  [] COPD   [] Asthma Neurologic:  [x] Dizziness   [] Seizures  [] Blackouts [] History of stroke   [] History of TIA  [] Aphasia   [] Temporary Blindness   [] Weakness or numbness in arm   [] Weakness or numbness in leg Musculoskeletal:   [] Joint swelling   [] Joint pain   [] Low back pain  []  History of Knee Replacement [] Arthritis [] back Surgeries  []  Spinal Stenosis    Hematologic:  [] Easy bruising  [] Easy bleeding   [] Hypercoagulable state   [] Anemic Gastrointestinal:  [] Diarrhea   [x] Vomiting  [] Gastroesophageal reflux/heartburn   [] Difficulty swallowing. [] Abdominal pain Genitourinary:  [] Chronic kidney disease   [] Difficult  urination  [] Anuric   [] Blood in urine [] Frequent urination  [] Burning with urination   [] Hematuria Skin:  [] Rashes   [] Ulcers [] Wounds Psychological:  [] History of anxiety   []  History of major depression  []  Memory Difficulties      OBJECTIVE:   Physical Exam  BP (!) 164/72 (BP Location: Left Arm, Patient Position: Sitting, Cuff Size: Small)   Pulse 81   Resp 10   Ht 4\' 11"  (1.499 m)   Wt 106 lb (48.1 kg)   LMP 09/19/2001 (Approximate)   BMI 21.41 kg/m   Gen: WD/WN, NAD Head: [place]/AT, No temporalis wasting.  Ear/Nose/Throat: Hearing grossly intact, nares w/o erythema or drainage Eyes: PER, EOMI, sclera nonicteric.  Neck: Supple, no masses.  No JVD.  Pulmonary:  Good air movement, no use of accessory muscles.  Cardiac: RRR Vascular:  Decent thrill, bruit is good in some areas while dull and others Vessel Right Left  Radial Palpable Palpable   Gastrointestinal: soft, non-distended. No guarding/no peritoneal signs.  Musculoskeletal: M/S 5/5 throughout.  No deformity or atrophy.  Neurologic: Pain and light touch intact in extremities.  Symmetrical.  Speech is fluent. Motor exam as listed above. Psychiatric: Judgment intact, Mood & affect appropriate for pt's clinical  situation. Dermatologic: No Venous rashes. No Ulcers Noted.  No changes consistent with cellulitis. Lymph : No Cervical lymphadenopathy, no lichenification or skin changes of chronic lymphedema.       ASSESSMENT AND PLAN:  1. Complication from renal dialysis device, sequela Today the patient underwent hemodialysis duplex which revealed a flow volume of 534.  There are elevated velocities located within the proximal upper arm with a diameter of 0.11.  Previous studies done on 05/22/2018 reveal a flow volume of 738.  There was also stenosis within the proximal upper arm however the diameter was 0.27  Recommend:  While the patient denies having problems with her dialysis access, the flow rates are fairly low which places the patient at risk for thrombotic events with in her fistula.  There is also the complicating factor that she had previous steal syndrome.  Patient should have a fistulagram with the intention for intervention.  The intention for intervention is to restore appropriate flow and prevent thrombosis and possible loss of the access.  As well as improve the quality of dialysis therapy.  The risks, benefits and alternative therapies were reviewed in detail with the patient.  All questions were answered.  The patient agrees to proceed with angio/intervention.      2. Hyperlipidemia, unspecified hyperlipidemia type Continue statin as ordered and reviewed, no changes at this time   3. Essential hypertension Continue antihypertensive medications as already ordered, these medications have been reviewed and there are no changes at this time.    Current Outpatient Medications on File Prior to Visit  Medication Sig Dispense Refill  . acetaminophen (TYLENOL) 500 MG tablet Take 1,000 mg by mouth every 6 (six) hours as needed for mild pain or headache.    Marland Kitchen amLODipine (NORVASC) 10 MG tablet Take 1 tablet (10 mg total) by mouth daily. 30 tablet 0  . atorvastatin (LIPITOR) 40 MG tablet  Take 40 mg by mouth daily at 6 PM.     . calcium acetate (PHOSLO) 667 MG capsule Take 1 capsule (667 mg total) by mouth 3 (three) times daily with meals. 90 capsule 0  . hydrALAZINE (APRESOLINE) 50 MG tablet Take 1 tablet (50 mg total) by mouth 3 (three) times daily. 90 tablet 0  .  insulin NPH-regular Human (NOVOLIN 70/30) (70-30) 100 UNIT/ML injection Inject 6 Units into the skin 2 (two) times daily with a meal.     . insulin regular (HUMULIN R) 100 units/mL injection Inject into the skin.    Marland Kitchen lidocaine-prilocaine (EMLA) cream Apply 1 application topically as needed.    Marland Kitchen aspirin 81 MG EC tablet TAKE 1 TABLET BY MOUTH EVERY DAY (Patient not taking: Reported on 10/12/2018) 150 tablet 0  . docusate sodium (COLACE) 100 MG capsule Take 100 mg by mouth 2 (two) times daily.    . furosemide (LASIX) 20 MG tablet Take 20 mg by mouth daily.    Marland Kitchen gabapentin (NEURONTIN) 300 MG capsule Take by mouth.    . hydrOXYzine (ATARAX/VISTARIL) 25 MG tablet Take 25 mg by mouth 2 (two) times daily as needed for itching.    Marland Kitchen lisinopril (PRINIVIL,ZESTRIL) 40 MG tablet Take by mouth.    . losartan (COZAAR) 50 MG tablet Take 1 tablet (50 mg total) by mouth daily. 30 tablet 0  . Nutritional Supplements (FEEDING SUPPLEMENT, NEPRO CARB STEADY,) LIQD Take 237 mLs by mouth 2 (two) times daily between meals. (Patient not taking: Reported on 10/12/2018) 60 Can 0  . sodium bicarbonate 650 MG tablet Take 1,300 mg by mouth 2 (two) times daily.     No current facility-administered medications on file prior to visit.     There are no Patient Instructions on file for this visit. No follow-ups on file.   Kris Hartmann, NP  This note was completed with Sales executive.  Any errors are purely unintentional.

## 2019-02-10 NOTE — Telephone Encounter (Signed)
Spoke with the patient's interpreter in office and the pre-procedure instructions were gone over as well as day and arrival time. Procedure is scheduled for 02/15/2019 with a 7:45 am arrival time with Dr. Lucky Cowboy.

## 2019-02-15 ENCOUNTER — Other Ambulatory Visit (INDEPENDENT_AMBULATORY_CARE_PROVIDER_SITE_OTHER): Payer: Self-pay | Admitting: Nurse Practitioner

## 2019-02-15 ENCOUNTER — Encounter
Admission: RE | Disposition: A | Discharge status: Home / Self Care | Payer: Self-pay | Source: Home / Self Care | Attending: Vascular Surgery

## 2019-02-15 ENCOUNTER — Ambulatory Visit
Admission: RE | Admit: 2019-02-15 | Discharge: 2019-02-15 | Disposition: A | Discharge status: Home / Self Care | Payer: Self-pay | Attending: Vascular Surgery | Admitting: Vascular Surgery

## 2019-02-15 DIAGNOSIS — Z79899 Other long term (current) drug therapy: Secondary | ICD-10-CM | POA: Insufficient documentation

## 2019-02-15 DIAGNOSIS — E11319 Type 2 diabetes mellitus with unspecified diabetic retinopathy without macular edema: Secondary | ICD-10-CM | POA: Insufficient documentation

## 2019-02-15 DIAGNOSIS — Z992 Dependence on renal dialysis: Secondary | ICD-10-CM | POA: Insufficient documentation

## 2019-02-15 DIAGNOSIS — E875 Hyperkalemia: Secondary | ICD-10-CM | POA: Insufficient documentation

## 2019-02-15 DIAGNOSIS — M1990 Unspecified osteoarthritis, unspecified site: Secondary | ICD-10-CM | POA: Insufficient documentation

## 2019-02-15 DIAGNOSIS — E785 Hyperlipidemia, unspecified: Secondary | ICD-10-CM | POA: Insufficient documentation

## 2019-02-15 DIAGNOSIS — N186 End stage renal disease: Secondary | ICD-10-CM

## 2019-02-15 DIAGNOSIS — I120 Hypertensive chronic kidney disease with stage 5 chronic kidney disease or end stage renal disease: Secondary | ICD-10-CM | POA: Insufficient documentation

## 2019-02-15 DIAGNOSIS — E1122 Type 2 diabetes mellitus with diabetic chronic kidney disease: Secondary | ICD-10-CM | POA: Insufficient documentation

## 2019-02-15 DIAGNOSIS — Z794 Long term (current) use of insulin: Secondary | ICD-10-CM | POA: Insufficient documentation

## 2019-02-15 DIAGNOSIS — G43909 Migraine, unspecified, not intractable, without status migrainosus: Secondary | ICD-10-CM | POA: Insufficient documentation

## 2019-02-15 LAB — GLUCOSE, CAPILLARY: GLUCOSE-CAPILLARY: 98 mg/dL (ref 70–99)

## 2019-02-15 LAB — BASIC METABOLIC PANEL
Anion gap: 9 (ref 5–15)
BUN: 17 mg/dL (ref 8–23)
CO2: 28 mmol/L (ref 22–32)
Calcium: 8.1 mg/dL — ABNORMAL LOW (ref 8.9–10.3)
Chloride: 107 mmol/L (ref 98–111)
Creatinine, Ser: 1.96 mg/dL — ABNORMAL HIGH (ref 0.44–1.00)
GFR calc Af Amer: 31 mL/min — ABNORMAL LOW (ref >60)
GFR calc non Af Amer: 27 mL/min — ABNORMAL LOW (ref >60)
Glucose, Bld: 136 mg/dL — ABNORMAL HIGH (ref 70–99)
Potassium: 3.2 mmol/L — ABNORMAL LOW (ref 3.5–5.1)
Sodium: 144 mmol/L (ref 135–145)

## 2019-02-15 LAB — POTASSIUM: Potassium: 6.6 mmol/L (ref 3.5–5.1)

## 2019-02-15 LAB — POTASSIUM (ARMC VASCULAR LAB ONLY): Potassium (ARMC vascular lab): 6.9 (ref 3.5–5.1)

## 2019-02-15 SURGERY — A/V FISTULAGRAM
Anesthesia: Moderate Sedation | Laterality: Right

## 2019-02-15 MED ORDER — CHLORHEXIDINE GLUCONATE CLOTH 2 % EX PADS: 6.0000 | MEDICATED_PAD | Freq: Every day | CUTANEOUS | Status: DC

## 2019-02-15 MED ORDER — HYDROMORPHONE HCL 1 MG/ML IJ SOLN: 1.0000 mg | Freq: Once | INTRAMUSCULAR | Status: DC | PRN

## 2019-02-15 MED ORDER — MIDAZOLAM HCL 2 MG/ML PO SYRP: 8.0000 mg | ORAL_SOLUTION | Freq: Once | ORAL | Status: DC | PRN

## 2019-02-15 MED ORDER — CEFAZOLIN SODIUM-DEXTROSE 1-4 GM/50ML-% IV SOLN: 1.0000 g | Freq: Once | INTRAVENOUS | Status: DC

## 2019-02-15 MED ORDER — HEPARIN SODIUM (PORCINE) 1000 UNIT/ML DIALYSIS
20.0000 [IU]/kg | INTRAMUSCULAR | Status: DC | PRN
  Filled 2019-02-15: qty 1

## 2019-02-15 MED ORDER — ONDANSETRON HCL 4 MG/2ML IJ SOLN: 4.0000 mg | Freq: Four times a day (QID) | INTRAMUSCULAR | Status: DC | PRN

## 2019-02-15 MED ORDER — METHYLPREDNISOLONE SODIUM SUCC 125 MG IJ SOLR: 125.0000 mg | Freq: Once | INTRAMUSCULAR | Status: DC | PRN

## 2019-02-15 MED ORDER — DIPHENHYDRAMINE HCL 50 MG/ML IJ SOLN: 50.0000 mg | Freq: Once | INTRAMUSCULAR | Status: DC | PRN

## 2019-02-15 MED ORDER — SODIUM CHLORIDE 0.9 % IV SOLN
INTRAVENOUS | Status: DC
  Administered 2019-02-15: 09:00:00 via INTRAVENOUS

## 2019-02-15 MED ORDER — FAMOTIDINE 20 MG PO TABS: 40.0000 mg | ORAL_TABLET | Freq: Once | ORAL | Status: DC | PRN

## 2019-02-15 NOTE — Progress Notes (Signed)
[facility], [place] 02/15/19  Subjective:   Patient known to our practice from previous admissions.  Conversation through [place] interpreter. She presents for angiogram but was found to have severe hyperkalemia therefore her procedure has been canceled and nephrology has been requested to evaluate for urgent hemodialysis.  Patient states she has not missed any treatments.  Her last hemodialysis treatment was on Saturday.,  On high potassium foods were reviewed with the patient.  She denies eating excessive amounts of pills.  Denies any shortness of breath or leg edema.  Objective:  Vital signs in last 24 hours:  Temp:  [97.9 F (36.6 C)] 97.9 F (36.6 C) (03/09 0822) Pulse Rate:  [78] 78 (03/09 0822) Resp:  [16] 16 (03/09 0822) BP: (206)/(81) 206/81 (03/09 0822) SpO2:  [97 %] 97 % (03/09 0822) Weight:  [48.1 kg] 48.1 kg (03/09 0822)  Weight change:  Filed Weights   02/15/19 0822  Weight: 48.1 kg    Intake/Output:   No intake or output data in the 24 hours ending 02/15/19 1101   Physical Exam: General:  No acute distress, laying in the bed  HEENT  anicteric, moist oral mucous membranes  Neck  supple  Pulm/lungs  normal breathing effort, clear to auscultation  CVS/Heart  regular rhythm, sinus on telemetry  Abdomen:   Soft, nontender  Extremities:  No peripheral edema  Neurologic:  Alert, oriented  Skin:  No acute rashes  Access:  Right arm AV fistula       Basic Metabolic Panel:  Recent Labs  Lab 02/15/19 0926  K 6.6*     CBC: No results for input(s): WBC, NEUTROABS, HGB, HCT, MCV, PLT in the last 168 hours.    Lab Results  Component Value Date   HEPBSAG Negative 12/25/2017   HEPBSAB Non Reactive 01/14/2017   HEPBIGM Negative 04/16/2016      Microbiology:  No results found for this or any previous visit (from the past 240 hour(s)).  Coagulation Studies: No results for input(s): LABPROT, INR in the last 72  hours.  Urinalysis: No results for input(s): COLORURINE, LABSPEC, PHURINE, GLUCOSEU, HGBUR, BILIRUBINUR, KETONESUR, PROTEINUR, UROBILINOGEN, NITRITE, LEUKOCYTESUR in the last 72 hours.  Invalid input(s): APPERANCEUR    Imaging: No results found.   Medications:   . sodium chloride    .  ceFAZolin (ANCEF) IV      diphenhydrAMINE, famotidine, HYDROmorphone (DILAUDID) injection, methylPREDNISolone (SOLU-MEDROL) injection, midazolam, ondansetron (ZOFRAN) IV  Assessment/ Plan:  63 y.o. Hispanic female With end-stage renal disease, insulin-dependent diabetes, diabetic retinopathy, hypertension, hyperlipidemia, history of hemorrhagic cystitis, migraine headaches, osteoarthritis, osteoporosis  [facility]/TTS/right upper arm AV fistula  1.  Severe hyperkalemia 2.  End-stage renal disease 3.  Hypertension with CKD  Plan: Urgent hemodialysis to correct hyperkalemia. Check BMP prior to completing treatment Continue home dose of antihypertensives Discussed case with Dr. dew.  Patient will be discharged to home after dialysis and procedure will be rescheduled by vascular surgery office.   LOS: 0 Reyne Falconi 3/9/202011:01 AM  [place], [place]  Note: This note was prepared with Dragon dictation. Any transcription errors are unintentional

## 2019-02-15 NOTE — Progress Notes (Signed)
Pt. Transferred to Dialysis now , via stretcher, on monitor for outpatient dialysis x 1. Report given to Dialysis RN.

## 2019-02-15 NOTE — Progress Notes (Signed)
Dr. Lucky Cowboy came by-made aware of K+ level of 6.9 & fact pt. Not taking most of meds. Also made aware of fever 2 days ago. To repeat K+ level. No new orders for BP at present. States "lets hold off on the BP med to see if we do he r not today."

## 2019-02-15 NOTE — Progress Notes (Signed)
Pt. Received back from Dialysis. Called Dr. Bunnie Domino office to reschedule fistulogram for next Mon. 02/22/19. Pt. Grand daughter here to review DC instructions and take pt. Home. Pt. Stable for DC home.

## 2019-02-15 NOTE — Progress Notes (Signed)
Post HD [facility]    02/15/19 1515  Hand-Off documentation  Report given to (Full Name) Fransico Michael, RN   Report received from (Full Name) Beatris Ship, RN   Vital Signs  Temp 98 F (36.7 C)  Temp Source Oral  Pulse Rate 76  Pulse Rate Source Monitor  Resp 20  BP (!) 193/71  BP Location Left Arm  BP Method Automatic  Patient Position (if appropriate) Lying  Oxygen Therapy  SpO2 99 %  O2 Device Room Air  Pain Assessment  Pain Scale 0-10  Pain Score 0  Dialysis Weight  Weight 48.2 kg  Type of Weight Post-Dialysis  Post-Hemodialysis Assessment  Rinseback Volume (mL) 250 mL  KECN 72.6 V  Dialyzer Clearance Lightly streaked  Duration of HD Treatment -hour(s) 3.5 hour(s)  Hemodialysis Intake (mL) 500 mL  UF Total -Machine (mL) 503 mL  Net UF (mL) 3 mL  Tolerated HD Treatment Yes  AVG/AVF Arterial Site Held (minutes) 10 minutes  AVG/AVF Venous Site Held (minutes) 10 minutes  Fistula / Graft Right Upper arm Arteriovenous fistula  Placement Date: 11/13/16   Placed prior to admission: Yes  Orientation: Right  Access Location: Upper arm  Access Type: Arteriovenous fistula  Site Condition No complications  Fistula / Graft Assessment Present;Thrill;Bruit  Status Deaccessed  Drainage Description None

## 2019-02-15 NOTE — Progress Notes (Signed)
HD Tx completed, tolerated well, no UF, pt was NPO prior to tx, UF dropped B/P l   02/15/19 1510  Vital Signs  Pulse Rate 77  Pulse Rate Source Monitor  Resp (!) 22  BP (!) 196/73  BP Location Left Arm  BP Method Automatic  Patient Position (if appropriate) Lying  Oxygen Therapy  SpO2 98 %  O2 Device Room Air  During Hemodialysis Assessment  HD Safety Checks Performed Yes  KECN 72.6 KECN  Dialysis Fluid Bolus Normal Saline  Bolus Amount (mL) 250 mL  Intra-Hemodialysis Comments Tx completed;Tolerated well  ow MD notified.

## 2019-02-15 NOTE — Progress Notes (Signed)
HD TX started w/o coplication. Pt is hypertensive expected to improve w/tx. Pt tx started urgently as pt K is 6.6.   02/15/19 1139  Hand-Off documentation  Report given to (Full Name) Beatris Ship, RN   Report received from (Full Name) Fransico Michael, RN   Vital Signs  Temp 98 F (36.7 C)  Temp Source Oral  Pulse Rate Source Monitor  Resp 18  BP (!) 195/78  BP Location Left Arm  BP Method Automatic  Patient Position (if appropriate) Lying  Oxygen Therapy  SpO2 98 %  O2 Device Room Air  Pulse Oximetry Type Continuous  Pain Assessment  Pain Scale 0-10  Pain Score 0  Dialysis Weight  Weight 48.1 kg  Type of Weight Pre-Dialysis  Time-Out for Hemodialysis  What Procedure? HD  Pt Identifiers(min of two) First/Last Name;MRN/Account#  Correct Site? Yes  Correct Side? Yes  Correct Procedure? Yes  Consents Verified? Yes  Rad Studies Available? N/A  Safety Precautions Reviewed? Yes  Engineer, civil (consulting) Number 4  Station Number 4  UF/Alarm Test Passed  Conductivity: Meter 14  Conductivity: Machine  14  pH 7.4  Reverse Osmosis Main  Normal Saline Lot Number [id number]  Dialyzer Lot Number [id number]  Disposable Set Lot Number [id number]  Machine Temperature 98.6 F (37 C)  Musician and Audible Yes  Blood Lines Intact and Secured Yes  Pre Treatment Patient Checks  Vascular access used during treatment Fistula  Hepatitis B Surface Antigen Results  (unknown)  Isolation Initiated Yes  Hepatitis B Surface Antibody  (unknown)  Hemodialysis Consent Verified Yes  Hemodialysis Standing Orders Initiated Yes  ECG (Telemetry) Monitor On Yes  Prime Ordered Normal Saline  Length of  DialysisTreatment -hour(s) 3.5 Hour(s)  Dialysis Treatment Comments Na 140  Dialyzer Elisio 17H NR  Dialysate 1K  Dialysis Anticoagulant None  Dialysate Flow Ordered 800  Blood Flow Rate Ordered 400 mL/min  Ultrafiltration Goal 0.5 Liters  Dialysis Blood Pressure Support Ordered Normal Saline   During Hemodialysis Assessment  Blood Flow Rate (mL/min) 400 mL/min  Arterial Pressure (mmHg) -130 mmHg  Venous Pressure (mmHg) 200 mmHg  Transmembrane Pressure (mmHg) 30 mmHg  Ultrafiltration Rate (mL/min) 110 mL/min  Dialysate Flow Rate (mL/min) 800 ml/min  Conductivity: Machine  14  HD Safety Checks Performed Yes  Dialysis Fluid Bolus Normal Saline  Bolus Amount (mL) 250 mL  Intra-Hemodialysis Comments Tx initiated  Education / Care Plan  Dialysis Education Provided Yes  Documented Education in Care Plan Yes  Fistula / Graft Right Upper arm Arteriovenous fistula  Placement Date: 11/13/16   Placed prior to admission: Yes  Orientation: Right  Access Location: Upper arm  Access Type: Arteriovenous fistula  Site Condition No complications  Fistula / Graft Assessment Present;Thrill;Bruit  Status Accessed  Needle Size 15 g  Drainage Description None

## 2019-02-15 NOTE — Progress Notes (Signed)
2nd K+ level : 6.6. Consult to nephrology per Dr. Lucky Cowboy order. Interpretor Maritza in and told pt. Of consult. Spoke with grand daughter Renea Ee aware of consult. Lissette states via phone "I should be there in about 20 min."

## 2019-02-15 NOTE — Progress Notes (Signed)
Pre HD assessment    02/15/19 1130  Neurological  Level of Consciousness Alert  Orientation Level Oriented X4  Respiratory  Respiratory Pattern Regular;Unlabored  Chest Assessment Chest expansion symmetrical  Bilateral Breath Sounds Clear;Diminished  Cough None  Cardiac  Pulse Regular  Heart Sounds S1, S2  ECG Monitor Yes  Cardiac Rhythm NSR  Vascular  R Radial Pulse +2  L Radial Pulse +2  Edema Generalized  Generalized Edema None  Psychosocial  Psychosocial (WDL) WDL

## 2019-02-15 NOTE — Progress Notes (Signed)
Pt. States she had a fever last Tues. And then again on Sat. (2 days ago). Pt. Has a + cough, non febrile at present. Called infection control officer Rachelle Hora and spoke with her. Pt. Placed on Droplet precautions now. Pt. States she only takes her BP med and her insulin, and does Not want to take any of her other meds. MD made aware.

## 2019-02-15 NOTE — Progress Notes (Signed)
Dr. Candiss Norse in to see pt. Dr. Candiss Norse spoke with Dr. Lucky Cowboy on phone. Interpretor Martiza here to help Dr. Candiss Norse speak with pt. Pt. To have dialysis here today , then reschedule fistulogram.

## 2019-02-15 NOTE — H&P (Signed)
[facility] History & Physical Update  The patient was interviewed and re-examined.  The patient's previous History and Physical has been reviewed and is unchanged.  There is no change in the plan of care. We plan to proceed with the scheduled procedure.  [name], MD  02/15/2019, 8:13 AM

## 2019-02-15 NOTE — Progress Notes (Signed)
K+ level redrawn now by lab. Pt. Tolerated well.

## 2019-02-15 NOTE — Discharge Instructions (Signed)
Plan de alimentacin para pacientes con dilisis Eating Plan for Dialysis La dilisis es un tratamiento que limpia la [place]. Se realiza cuando tiene dao renal. Las personas que deben realizarse dilisis tienen que cuidar su dieta. Esto se debe a que algunos nutrientes pueden acumularse en la sangre entre un tratamiento y [place], y causar enfermedades. Su mdico o especialista en alimentacin (nutricionista) podr:  Informarle qu nutrientes debe incluir o evitar.  Informarle la cantidad de estos nutrientes que debe consumir Armed forces operational officer.  Ayudarle a planificar las comidas.  Informarle la cantidad de lquido que tiene que beber Armed forces operational officer. Cules son algunos consejos para seguir este plan? Leer las etiquetas de los alimentos  Lea las etiquetas de los alimentos para conocer lo siguiente: ? Potasio. Este se encuentra en [place] y las verduras. ? Fsforo. Este se [employer], el queso, los frijoles, los frutos secos y [place]. ? Sal (sodio). Esta se encuentra en las carnes procesadas, los embutidos, las comidas congeladas preelaboradas, las verduras enlatadas y los refrigerios salados.  Intente encontrar alimentos con bajo contenido de potasio, fsforo y [place].  Busque los alimentos etiquetados sin sodio, muy bajo en sodio o bajo en sodio. Al ir de compras  No compre alimentos integrales y con alto contenido de fibra.  No compre ni use sustitutos de sal.  No compre alimentos procesados. Al cocinar  Elimine todo el lquido de las verduras cocidas y las frutas enlatadas antes de consumirlas.  Antes de cocinar papas, crtelas en trozos pequeos. Luego hirvalas en agua sin sal.  Intente usar hierbas y especias que no contengan sodio, para Animator. Planificacin de las comidas [place] personas en dilisis debe intentar comer:  6 a 11 porciones de Location manager. Una porcin equivale a 1rebanada de pan o a taza de arroz o pasta  cocidos.  2 a 3 porciones de verduras con bajo contenido de Comptroller. Una porcin equivale a taza.  2 a 3 porciones de frutas con bajo contenido de Comptroller. Una porcin equivale a taza.  Protenas, como carne, ave, pescado y [place]. Hable con su mdico o nutricionista sobre la cantidad y el tipo de protenas adecuados que debe consumir.  taza de productos lcteos cada da. Informacin general  Siga las indicaciones del mdico respecto de la cantidad de lquido que tiene que beber. Es posible que le indiquen lo siguiente: ? Anote lo que bebe. ? Anote los alimentos que consume que estn elaborados principalmente a base de agua, como gelatinas y sopas. ? Beba los lquidos en tazas pequeas.  Tome vitaminas y suplementos minerales solamente como se lo haya indicado el mdico.  Tome los medicamentos de venta libre y los recetados solamente como se lo haya indicado el mdico. Qu alimentos puedo comer?     Frutas Manzanas. Frutos rojos frescos o congelados. Peras, duraznos y anan frescos o enlatados. Uvas. Ciruelas. Verduras Brcoli, zanahorias y frijoles verdes frescos o congelados. Repollo. Coliflor. Apio. Pepinos. [place]. Rbanos. [place]. Cereales [place] cocidos. Palomitas de maz sin sal. Tortillas. Pastas. Carnes y otras protenas [place], cerdo, pollo y pescado frescos o congelados. Huevos. Lcteos Queso crema. Crema espesa. Queso ricota. Bebidas Sidra de [place]. Jugo de arndanos. Mosto. Limonada. Caf negro. Leche de arroz (no pasteurizada ni fortificada). Alios y condimentos Hierbas. Especias. Mermelada y [place]. Miel. Dulces y [employer]. Bizcochuelos. Galletitas. Grasas y aceites Aceite de [place], de canola y de [place]. Otros alimentos  Crema no lctea. Cobertura batida no lctea. Caldos caseros sin sal. Es posible que los productos que se enumeran ms [place] no constituyan una lista completa de los  alimentos y las bebidas que puede tomar. Comunquese con el nutricionista para conocer ms opciones. Qu alimentos debo evitar? Lambert Mody Carambola. Bananas. Naranjas. Kiwi. Pelones. Ciruelas pasas. Meln. Frutas secas. [place]. Verduras Papas. Remolachas. Tomates. Zapallo y calabaza. Esprragos. [place]. Chiriva. Cereales Panes integrales. Pastas integrales. Cereales con [place]. Carnes y otras protenas Carnes enlatadas y [place], y embutidos. Embutidos envasados. Sardinas. Frutos secos y semillas. [place] de man. Frijoles y legumbres. Lcteos Leche. Suero de [place]. Yogur. Queso y [employer]. Quesos untables procesados. Bebidas Jugo de naranjas. Jugo de ciruelas. Bebidas gaseosas. Alios y condimentos Sal. Sustitutos de la sal. Salsa de soja. Dulces y [employer]. Chocolate. Pralin de frutos secos. Grasas y [employer]. Margarina. Otros alimentos Comidas congeladas preelaboradas. Sopas enlatadas. Es posible que los productos que se enumeran ms [place] no constituyan una lista completa de los alimentos y las bebidas que Nurse, adult. Comunquese con el nutricionista para obtener ms informacin. Resumen  Si se realiza dilisis, es importante que preste atencin a lo que come. Determinados nutrientes y desechos pueden acumularse en la sangre y causar que se enferme.  El nutricionista lo ayudar a Animal nutritionist de alimentacin para sus necesidades.  Evite los alimentos con alto contenido de potasio, sal (sodio) y fsforo. Limite su ingesta de lquidos como se lo haya indicado el mdico o nutricionista. Esta informacin no tiene Marine scientist el consejo del mdico. Asegrese de hacerle al mdico cualquier pregunta que tenga. Document Released: 05/26/2012 Document Revised: 12/31/2017 Document Reviewed: 12/31/2017 Elsevier Interactive Patient Education  2019 [employer].   Alimentacin bsica para la enfermedad renal Network engineer  for Chronic Kidney Disease Cuando los riones no funcionan bien, no logran [employer] residuos y los excesos de sustancias de la sangre de la misma manera que antes. Esto puede producir Music therapist y un desequilibrio de estas sustancias, lo que puede empeorar el dao renal y [place] funciones corporales. Ciertos alimentos provocan la acumulacin de estas sustancias en el cuerpo. Si hace los cambios en la dieta que su mdico o especialista en alimentacin (nutricionista) le recomendaron, podra lograr prevenir un mayor dao renal y Lexicographer o prevenir la necesidad de dilisis. Consejos para seguir este plan Instrucciones generales   Trabaje con el mdico y con el nutricionista para crear un plan de comidas que sea adecuado para usted. Los alimentos que puede comer, que debe limitar y los que debe evitar sern diferentes para cada persona, segn la etapa de la enfermedad renal y otras afecciones existentes.  Pregunte al mdico si debe consumir algn suplemento de vitaminas o minerales.  Use tazas y cucharas de medicin comunes para calcular las porciones. Use una balanza de cocina para medir las porciones de los alimentos proteicos.  Si el mdico se lo indica, evite tomar demasiado lquido. Mida y contabilice todos los lquidos que consuma, incluidos el agua, el hielo, las sopas, las gelatinas saborizadas y los postres helados, como helados de agua y de crema. Lectura de las etiquetas de los [employer] la cantidad de sodio de los alimentos. Elija alimentos que contengan menos de 374miligramos(mg) por porcin.  Revise la lista de ingredientes para controlar si tienen fsforo, o aditivos o conservantes a base de potasio.  Controle la cantidad de grasas saturadas y trans. Limite la cantidad de estas grasas o evtelas  por completo, segn las indicaciones del nutricionista. De compras  Evite comprar alimentos con las siguientes caractersticas: ? Procesados, congelados o  preenvasados. ? Enriquecidos o fortificados con calcio.  No compre alimentos que contengan sal ni sodio entre los primeros cinco ingredientes de la lista.  No compre verduras enlatadas. Coccin  Remplace las protenas de origen animal, como la carne, el pescado, los huevos o los productos lcteos, por protenas de origen vegetal, como los frijoles, las nueces y la soja. ? Use leche de soja en lugar de [place] de [place]. ? Agregue frijoles o tofu a las sopas, los guisos o los platos de Building surveyor de carne.  Remoje las verduras, como las papas, antes de cocinarlas para disminuir la cantidad de [place]. Haga lo siguiente: ? Plelas y crtelas en trozos pequeos. ? Remjelas en agua tibia durante, al menos, 2horas. Por cada taza de verduras, use 10tazas de agua. ? Escrralas y enjuguelas con agua tibia. ? Hirvalas durante, al menos, 48minutos. Planificacin de las comidas  Limite la cantidad de protenas de origen animal y vegetal que consume Armed forces operational officer.  No agregue sal a los alimentos cuando cocine ni antes de comer.  Coma las comidas principales y las colaciones alrededor de la misma [employer]. Si usted tiene diabetes:  Si tiene diabetes (diabetes mellitus) y enfermedad renal crnica, es importante que mantenga el nivel de glucemia dentro de los valores deseados recomendados por el mdico. Siga su plan de control de la diabetes. Esto puede incluir lo siguiente: ? Controlar el nivel de glucemia con regularidad. ? Tomar medicamentos orales, colocarse insulina o ambos. ? Realizar actividad fsica durante, al menos, 55minutos 5das por semana o ms, o como se lo haya indicado el mdico. ? Registrar cuntas porciones de carbohidratos consume en cada comida.  Es posible que le den indicaciones especficas sobre la cantidad que puede consumir de ciertos alimentos o nutrientes, segn la etapa de la enfermedad renal y si tiene hipertensin arterial. Siga el plan de alimentacin  como se lo haya indicado el nutricionista. La ingesta de qu nutrientes debe limitar? Podra haber otros elementos aparte de los que se Designer, television/film set. Hable con el nutricionista sobre las mejores opciones alimenticias para usted. Potasio El potasio afecta el ritmo cardaco. Si se acumula mucho potasio en la [place], [place] producir latidos cardacos irregulares o incluso un infarto de miocardio. Es posible que deba consumir una menor cantidad de potasio, segn los niveles de potasio en la sangre que tenga y la etapa de la enfermedad renal. Hable con el nutricionista sobre la cantidad de potasio que puede consumir por Training and development officer. Es posible que Conservator, museum/gallery o evitar el consumo de alimentos con alto contenido de potasio, por ejemplo:  Leche y [place] de soja.  Frutas, como bananas, papaya, damascos, pelones, meln, ciruelas, uvas, kiwi y naranjas.  Verduras, como papas, batatas, camotes, tomates, verduras de hoja, remolachas, ocra, aguacate, zapallo y calabaza.  Frijoles blancos y frijoles lima. Fsforo El fsforo es un mineral que se encuentra en los [place]. Es necesario que el calcio y el fsforo estn equilibrados para Actor y Insurance account manager sanos. Una cantidad muy alta de fsforo elimina el calcio de los [place]. Esto har que sus huesos se debiliten y sean ms propensos a romperse. Mucha cantidad de fsforo tambin le causar picazn en la piel. Es posible que deba comer una menor cantidad de fsforo, segn el nivel de fsforo en la sangre que tenga y la etapa de la enfermedad  renal. Hable con el nutricionista sobre la cantidad de potasio que puede consumir por Training and development officer. Posiblemente deba tomar medicamentos para reducir Retail buyer de fsforo en la sangre si los cambios en la dieta no ayudan. Es posible que deba limitar o evitar el consumo de alimentos con alto contenido de fsforo, por ejemplo:  Leche y otros productos lcteos.  Porotos y [employer].  Tofu, leche de soja y otros  sustitutos de la carne derivados de la soja.  Bebidas cola.  Nueces y [place] de man.  Carne, aves y pescado.  Cereales de salvado y avena. Protenas Las protenas lo ayudan a Actor y [employer]. Tambin ayudan a reparar las clulas y los tejidos del organismo. Uno de los productos naturales de la degradacin de las protenas es un residuo llamado urea. Cuando los riones no funcionan adecuadamente, no logran [employer] residuos, como la urea, de la misma manera que antes de tener enfermedad renal crnica. Reducir el consumo de protenas puede ayudar a evitar la acumulacin de urea en la sangre. Segn la etapa de la enfermedad renal, es posible que deba limitar los alimentos con alto contenido de protenas. Algunas fuentes protenas de origen animal:  Carne (todo tipo).  Pescado y [employer].  Aves.  Huevos.  Productos lcteos. Otros alimentos que contienen protenas:  Frijoles y legumbres.  Frutos secos y [place] de frutos secos.  Soja y tofu. Sodio [place], que est presente en la sal, ayuda a Theatre manager un equilibrio saludable de los fluidos corporales. Mucha cantidad de sodio puede aumentar la presin arterial y perjudicar la funcin cardaca y pulmonar. Mucha cantidad de sodio tambin puede hacer que el cuerpo retenga mucho lquido y [place] riones se esfuercen ms. La [employer] de las personas debe consumir menos de 232miligramos(mg) de Electrical engineer. Si tiene hipertensin, es posible que deba limitar el consumo de sodio a 1500mg  por Training and development officer. Hable con el nutricionista sobre la cantidad de sodio que puede consumir por Training and development officer. Es posible que deba limitar o evitar el consumo de alimentos con alto contenido de sodio, por ejemplo:  Condimentos salados.  Salsa de soja.  Carnes curadas y procesadas.  Galletas saladas y bocadillos.  Comida rpida.  Sopas enlatadas y Media planner de los alimentos enlatados.  Alimentos encurtidos.  Jugo de  vegetales.  Mezclas en cajas o alimentos y guarniciones en cajas listos para consumir.  Aderezos, salsas y adobos en botellas. Resumen  La enfermedad renal crnica puede provocar una acumulacin y un desequilibrio de los residuos y los excesos de sustancias en el organismo. Ciertos alimentos producen la acumulacin de estas sustancias. Si ajusta el consumo de estos alimentos, podra lograr prevenir un mayor dao renal y Lexicographer o prevenir la necesidad de dilisis.  Los [employer] alimentacin son diferentes para cada persona con enfermedad renal crnica. Trabaje con un nutricionista para establecer objetivos nutricionales y crear un plan de comidas que sea adecuado para usted.  Si tiene diabetes y enfermedad renal crnica, es importante que mantenga el nivel de glucemia dentro de los valores deseados recomendados por el mdico. Esta informacin no tiene Marine scientist el consejo del mdico. Asegrese de hacerle al mdico cualquier pregunta que tenga. Document Released: 04/13/2009 Document Revised: 02/28/2017 Document Reviewed: 02/28/2017 Elsevier Interactive Patient Education  2019 [employer].

## 2019-02-22 ENCOUNTER — Other Ambulatory Visit (INDEPENDENT_AMBULATORY_CARE_PROVIDER_SITE_OTHER): Payer: Self-pay | Admitting: Vascular Surgery

## 2019-02-22 ENCOUNTER — Other Ambulatory Visit: Payer: Self-pay

## 2019-02-22 ENCOUNTER — Encounter: Payer: Self-pay | Admitting: *Deleted

## 2019-02-22 ENCOUNTER — Encounter
Admission: RE | Disposition: A | Discharge status: Home / Self Care | Payer: Self-pay | Source: Home / Self Care | Attending: Vascular Surgery

## 2019-02-22 ENCOUNTER — Ambulatory Visit
Admission: RE | Admit: 2019-02-22 | Discharge: 2019-02-22 | Disposition: A | Discharge status: Home / Self Care | Payer: Self-pay | Attending: Vascular Surgery | Admitting: Vascular Surgery

## 2019-02-22 DIAGNOSIS — Y832 Surgical operation with anastomosis, bypass or graft as the cause of abnormal reaction of the patient, or of later complication, without mention of misadventure at the time of the procedure: Secondary | ICD-10-CM | POA: Insufficient documentation

## 2019-02-22 DIAGNOSIS — Z7982 Long term (current) use of aspirin: Secondary | ICD-10-CM | POA: Insufficient documentation

## 2019-02-22 DIAGNOSIS — Z888 Allergy status to other drugs, medicaments and biological substances status: Secondary | ICD-10-CM | POA: Insufficient documentation

## 2019-02-22 DIAGNOSIS — N186 End stage renal disease: Secondary | ICD-10-CM | POA: Insufficient documentation

## 2019-02-22 DIAGNOSIS — M810 Age-related osteoporosis without current pathological fracture: Secondary | ICD-10-CM | POA: Insufficient documentation

## 2019-02-22 DIAGNOSIS — Z79899 Other long term (current) drug therapy: Secondary | ICD-10-CM | POA: Insufficient documentation

## 2019-02-22 DIAGNOSIS — Z794 Long term (current) use of insulin: Secondary | ICD-10-CM | POA: Insufficient documentation

## 2019-02-22 DIAGNOSIS — Z8673 Personal history of transient ischemic attack (TIA), and cerebral infarction without residual deficits: Secondary | ICD-10-CM | POA: Insufficient documentation

## 2019-02-22 DIAGNOSIS — T82858A Stenosis of vascular prosthetic devices, implants and grafts, initial encounter: Secondary | ICD-10-CM

## 2019-02-22 DIAGNOSIS — Z8249 Family history of ischemic heart disease and other diseases of the circulatory system: Secondary | ICD-10-CM | POA: Insufficient documentation

## 2019-02-22 DIAGNOSIS — I120 Hypertensive chronic kidney disease with stage 5 chronic kidney disease or end stage renal disease: Secondary | ICD-10-CM | POA: Insufficient documentation

## 2019-02-22 DIAGNOSIS — E1122 Type 2 diabetes mellitus with diabetic chronic kidney disease: Secondary | ICD-10-CM | POA: Insufficient documentation

## 2019-02-22 DIAGNOSIS — Z992 Dependence on renal dialysis: Secondary | ICD-10-CM

## 2019-02-22 DIAGNOSIS — E785 Hyperlipidemia, unspecified: Secondary | ICD-10-CM | POA: Insufficient documentation

## 2019-02-22 DIAGNOSIS — E1140 Type 2 diabetes mellitus with diabetic neuropathy, unspecified: Secondary | ICD-10-CM | POA: Insufficient documentation

## 2019-02-22 DIAGNOSIS — E11319 Type 2 diabetes mellitus with unspecified diabetic retinopathy without macular edema: Secondary | ICD-10-CM | POA: Insufficient documentation

## 2019-02-22 HISTORY — PX: A/V FISTULAGRAM: CATH118298

## 2019-02-22 LAB — GLUCOSE, CAPILLARY
Glucose-Capillary: 99 mg/dL (ref 70–99)
Glucose-Capillary: 99 mg/dL (ref 70–99)

## 2019-02-22 LAB — POTASSIUM (ARMC VASCULAR LAB ONLY): Potassium (ARMC vascular lab): 5.1 (ref 3.5–5.1)

## 2019-02-22 SURGERY — A/V FISTULAGRAM
Anesthesia: Moderate Sedation | Laterality: Right

## 2019-02-22 MED ORDER — MIDAZOLAM HCL 2 MG/2ML IJ SOLN
INTRAMUSCULAR | Status: DC | PRN
  Administered 2019-02-22: 2 mg via INTRAVENOUS

## 2019-02-22 MED ORDER — HEPARIN SODIUM (PORCINE) 1000 UNIT/ML IJ SOLN
INTRAMUSCULAR | Status: DC | PRN
  Administered 2019-02-22: 3000 [IU] via INTRAVENOUS

## 2019-02-22 MED ORDER — FAMOTIDINE 20 MG PO TABS: 40.0000 mg | ORAL_TABLET | Freq: Once | ORAL | Status: DC | PRN

## 2019-02-22 MED ORDER — ONDANSETRON HCL 4 MG/2ML IJ SOLN: 4.0000 mg | Freq: Four times a day (QID) | INTRAMUSCULAR | Status: DC | PRN

## 2019-02-22 MED ORDER — FENTANYL CITRATE (PF) 100 MCG/2ML IJ SOLN
INTRAMUSCULAR | Status: DC | PRN
  Administered 2019-02-22: 25 ug via INTRAVENOUS
  Administered 2019-02-22: 50 ug via INTRAVENOUS

## 2019-02-22 MED ORDER — METHYLPREDNISOLONE SODIUM SUCC 125 MG IJ SOLR: 125.0000 mg | Freq: Once | INTRAMUSCULAR | Status: DC | PRN

## 2019-02-22 MED ORDER — DIPHENHYDRAMINE HCL 50 MG/ML IJ SOLN: 50.0000 mg | Freq: Once | INTRAMUSCULAR | Status: DC | PRN

## 2019-02-22 MED ORDER — FENTANYL CITRATE (PF) 100 MCG/2ML IJ SOLN
INTRAMUSCULAR | Status: AC
  Filled 2019-02-22: qty 2

## 2019-02-22 MED ORDER — HEPARIN SODIUM (PORCINE) 1000 UNIT/ML IJ SOLN
INTRAMUSCULAR | Status: AC
  Filled 2019-02-22: qty 1

## 2019-02-22 MED ORDER — MIDAZOLAM HCL 2 MG/ML PO SYRP: 8.0000 mg | ORAL_SOLUTION | Freq: Once | ORAL | Status: DC | PRN

## 2019-02-22 MED ORDER — IOPAMIDOL (ISOVUE-300) INJECTION 61%
INTRAVENOUS | Status: DC | PRN
  Administered 2019-02-22: 35 mL via INTRA_ARTERIAL

## 2019-02-22 MED ORDER — CEFAZOLIN SODIUM-DEXTROSE 1-4 GM/50ML-% IV SOLN
1.0000 g | Freq: Once | INTRAVENOUS | Status: AC
  Administered 2019-02-22: 1 g via INTRAVENOUS

## 2019-02-22 MED ORDER — LIDOCAINE HCL (PF) 1 % IJ SOLN
INTRAMUSCULAR | Status: AC
  Filled 2019-02-22: qty 30

## 2019-02-22 MED ORDER — SODIUM CHLORIDE 0.9 % IV SOLN
INTRAVENOUS | Status: DC
  Administered 2019-02-22: 09:00:00 via INTRAVENOUS

## 2019-02-22 MED ORDER — HYDRALAZINE HCL 20 MG/ML IJ SOLN
INTRAMUSCULAR | Status: AC
  Filled 2019-02-22: qty 1

## 2019-02-22 MED ORDER — MIDAZOLAM HCL 5 MG/5ML IJ SOLN
INTRAMUSCULAR | Status: AC
  Filled 2019-02-22: qty 5

## 2019-02-22 MED ORDER — HYDRALAZINE HCL 20 MG/ML IJ SOLN
INTRAMUSCULAR | Status: DC | PRN
  Administered 2019-02-22: 10 mg via INTRAVENOUS

## 2019-02-22 MED ORDER — HYDROMORPHONE HCL 1 MG/ML IJ SOLN: 1.0000 mg | Freq: Once | INTRAMUSCULAR | Status: DC | PRN

## 2019-02-22 SURGICAL SUPPLY — 16 items
BALLN LUTONIX 7X100X130 (BALLOONS) ×3
BALLOON LUTONIX 7X100X130 (BALLOONS) ×1 IMPLANT
CANNULA 5F STIFF (CANNULA) ×3 IMPLANT
DEVICE PRESTO INFLATION (MISCELLANEOUS) ×3 IMPLANT
DRAPE BRACHIAL (DRAPES) ×3 IMPLANT
PACK ANGIOGRAPHY (CUSTOM PROCEDURE TRAY) ×3 IMPLANT
SHEATH BRITE TIP 6FRX5.5 (SHEATH) ×3 IMPLANT
SHEATH BRITE TIP 7FRX5.5 (SHEATH) ×6 IMPLANT
STENT VIABAHN 8X150X120 (Permanent Stent) ×2 IMPLANT
STENT VIABAHN 8X15X120 7FR (Permanent Stent) ×1 IMPLANT
SUT MNCRL 4-0 (SUTURE) ×2
SUT MNCRL 4-0 27XMFL (SUTURE) ×1
SUTURE MNCRL 4-0 27XMF (SUTURE) ×1 IMPLANT
TOWEL OR 17X26 4PK STRL BLUE (TOWEL DISPOSABLE) ×3 IMPLANT
WIRE G 018X200 V18 (WIRE) ×3 IMPLANT
WIRE MAGIC TOR.035 180C (WIRE) ×3 IMPLANT

## 2019-02-22 NOTE — OR Nursing (Signed)
PT INTERVIEWED WITH USE OF INTERPRETOR. SHE SAYS SHE DID NOT BRING HER MEDICATIONS FOR REVIEW AND NOT SURE WHAT SHE IS ON BUT THEY ARE  NOT CHANGED. No history change consent reviewed and pt signed but pt can not read or write so consent in [place].

## 2019-02-22 NOTE — H&P (Signed)
[facility] History & Physical Update  The patient was interviewed and re-examined.  The patient's previous History and Physical has been reviewed and is unchanged.  There is no change in the plan of care. We plan to proceed with the scheduled procedure.  [name], MD  02/22/2019, 9:14 AM

## 2019-02-22 NOTE — Op Note (Signed)
[facility]    OPERATIVE NOTE   PROCEDURE: 1.   Right brachiocephalic arteriovenous fistula cannulation under ultrasound guidance 2.   Right arm fistulagram including central venogram 3.   Percutaneous transluminal angioplasty of the proximal arm cephalic vein with 7 mm diameter Lutonix drug-coated angioplasty balloon 4.   Viabahn stent placement to the proximal upper arm cephalic vein with 8 mm diameter by 15 cm length Viabahn stent for 2 areas of greater than 50% stenosis after angioplasty  PRE-OPERATIVE DIAGNOSIS: 1. ESRD 2. Poorly functional right brachiocephalic AVF  POST-OPERATIVE DIAGNOSIS: same as above   SURGEON: [name], MD  ANESTHESIA: local with MCS  ESTIMATED BLOOD LOSS: 3 cc  FINDING(S): 1. Initial portion of the brachiocephalic fistula was patent without focal stenosis until the proximal upper arm cephalic vein had a near occlusive stenosis.  The vessel then normalized for several centimeters and then another greater than 80% stenosis was seen at the leading edge of the previously placed stent in the most proximal cephalic vein and across the cephalic vein subclavian vein confluence.  The remainder of the central venous circulation was then widely patent.  SPECIMEN(S):  None  CONTRAST: 35 cc  FLUORO TIME: 3.7 minutes  MODERATE CONSCIOUS SEDATION TIME: Approximately 25 minutes with 2 mg of Versed and 75 mcg of Fentanyl   INDICATIONS: Anita Contreras is a 63 y.o. female who presents with malfunctioning right arm AV fistula arteriovenous fistula.  The patient is scheduled for right arm fistulagram.  The patient is aware the risks include but are not limited to: bleeding, infection, thrombosis of the cannulated access, and possible anaphylactic reaction to the contrast.  The patient is aware of the risks of the procedure and elects to proceed forward.  DESCRIPTION: After full informed written consent was obtained, the patient was  brought back to the angiography suite and placed supine upon the angiography table.  The patient was connected to monitoring equipment. Moderate conscious sedation was administered with a face to face encounter with the patient throughout the procedure with my supervision of the RN administering medicines and monitoring the patient's vital signs and mental status throughout from the start of the procedure until the patient was taken to the recovery room. The right arm was prepped and draped in the standard fashion for a percutaneous access intervention.  Under ultrasound guidance, the right brachiocephalic arteriovenous fistula was cannulated with a micropuncture needle under direct ultrasound guidance where it was patent and a permanent image was performed.  The microwire was advanced into the fistula and the needle was exchanged for the a microsheath.  I then upsized to a 6 Fr Sheath and imaging was performed.  Hand injections were completed to image the access including the central venous system. This demonstrated Initial portion of the brachiocephalic fistula was patent without focal stenosis until the proximal upper arm cephalic vein had a near occlusive stenosis.  The vessel then normalized for several centimeters and then another greater than 80% stenosis was seen at the leading edge of the previously placed stent in the most proximal cephalic vein and across the cephalic vein subclavian vein confluence.  The remainder of the central venous circulation was then widely patent.  Based on the images, this patient will need intervention to the proximal upper arm cephalic vein to treat these 2 areas of stenosis. I then gave the patient 3000 units of intravenous heparin.  I then crossed the stenosis with a Magic Tourqe wire.  Based on the  imaging, a 7 mm x 10 cm tonics drug-coated angioplasty balloon was selected.  The balloon was centered around the proximal upper arm cephalic vein stenosis and inflated to well  ATM for 1 minute(s).  The balloon was then advanced into the stent and a second inflation to 12 atm was performed a few centimeters more proximally.  On completion imaging, a greater than 50 % residual stenosis was present in both locations.  With that, I elected to place a covered stent that would get both of these lesions.  I exchanged for 7 French sheath and a 0.018 wire.  I then used an 8 mm diameter by 15 cm length Viabahn stent into the previously placed stent but not all the way into the cephalic vein subclavian vein confluence and then back into the proximal to mid upper arm cephalic vein.  This was then postdilated with a 7 mm balloon with excellent angiographic completion result and less than 10% residual stenosis.     Based on the completion imaging, no further intervention is necessary.  The wire and balloon were removed from the sheath.  A 4-0 Monocryl purse-string suture was sewn around the sheath.  The sheath was removed while tying down the suture.  A sterile bandage was applied to the puncture site.  COMPLICATIONS: None  CONDITION: Stable   [name]  02/22/2019 10:43 AM   This note was created with Dragon Medical transcription system. Any errors in dictation are purely unintentional.

## 2019-02-22 NOTE — Discharge Instructions (Signed)
Fistulografía de diálisis, cuidados posteriores  Dialysis Fistulogram, Care After  Esta hoja le proporciona información sobre cómo cuidarse después del procedimiento. Su médico también podrá darle instrucciones más específicas. Comuníquese con su médico si tiene problemas o preguntas.  ¿Qué puedo esperar después del procedimiento?  Después del procedimiento, es común tener los siguientes síntomas:  · Una pequeña molestia en la zona en la que se colocó el tubo delgado y pequeño (catéter) para el procedimiento.  · Un pequeño hematoma alrededor de la fístula.  · Somnolencia y cansancio (fatiga).  Siga estas indicaciones en su casa:  Actividad    · Haga reposo en su casa y no levante objetos que pesen más de 5 lb (2,3 kg) el día después del procedimiento.  · Reanude sus actividades normales según lo indicado por el médico. Pregúntele al médico qué actividades son seguras para usted.  · No conduzca ni use maquinaria pesada mientras toma analgésicos recetados.  · No conduzca durante 24 horas si recibió un medicamento para ayudarlo a relajarse (sedante) durante el procedimiento.  Medicamentos    · Tome los medicamentos de venta libre y los recetados solamente como se lo haya indicado el médico.  Cuidado del lugar de la punción  · Siga las indicaciones de su médico acerca de cómo cuidar el lugar donde se insertaron los catéteres. Asegúrese de hacer lo siguiente:  ? Lávese las manos con agua y jabón antes de cambiar las vendas (vendaje). Use desinfectante para manos si no dispone de agua y jabón.  ? Cambie el vendaje como se lo haya indicado el médico.  ? No retire los puntos (suturas), la goma para cerrar la piel o las tiras adhesivas. Es posible que estos cierres cutáneos deban permanecer en la piel durante 2 semanas o más. Si los bordes de las tiras adhesivas empiezan a despegarse y enroscarse, puede recortar los que estén sueltos. No retire las tiras adhesivas por completo a menos que el médico se lo indique.  · Controle  la zona de la punción todos los días para detectar signos de infección. Esté atento a los siguientes signos:  ? Dolor, hinchazón o enrojecimiento.  ? Líquido o sangre.  ? Calor.  ? Pus o mal olor.  Instrucciones generales  · No tome baños de inmersión, no nade ni use el jacuzzi hasta que el médico lo autorice. Pregúntele al médico si puede ducharse. Tal vez solo le permitan darse baños de esponja.  · Controle atentamente la fístula de diálisis. Verifique para asegurarse de que puede sentir una vibración o un zumbido (frémito) cuando coloca los dedos sobre la fístula.  · Evite dañar el injerto o la fístula:  ? No use ropa ajustada ni joyas en el brazo o la pierna que tiene el injerto o la fístula.  ? Informe a todos sus médicos que tiene un injerto o una fístula para diálisis.  ? No permita extracciones de sangre, terapias intravenosas ni lecturas de la presión arterial en el brazo que tiene la fístula o el injerto.  ? No permita que le apliquen vacunas antigripales ni otras vacunas en el brazo con la fístula o el injerto.  · Concurra a todas las visitas de seguimiento como se lo haya indicado el médico. Esto es importante.  Comuníquese con un médico si:  · Tiene enrojecimiento, hinchazón o dolor en el lugar donde se insertó el catéter.  · Observa líquido o sangre que proviene del lugar de la inserción del catéter.  · El lugar de la inserción del catéter   está caliente al tacto.  · Tiene pus o percibe mal olor que proviene del lugar de la inserción del catéter.  · Tiene fiebre o siente escalofríos.  Solicite ayuda de inmediato si:  · Se siente débil.  · Tiene problemas de equilibrio.  · Tiene dificultad para mover los brazos o las piernas.  · Tiene problemas visuales o para hablar.  · Ya no puede sentir una vibración o un zumbido cuando coloca los dedos sobre la fístula de diálisis.  · La extremidad que se usó para el procedimiento:  ? Se hincha.  ? Duele.  ? Está fría.  ? Cambia de color, por ejemplo, se torna  azulada o blanco pálido.  · Siente falta de aire o dolor en el pecho.  Resumen  · Después de una fistulografía de diálisis, es común tener una pequeña molestia o algunos moretones en la zona donde se colocó el tubo delgado y pequeño (catéter).  · Descanse en su casa el día después del procedimiento. Reanude sus actividades normales según lo indicado por el médico.  · Tome los medicamentos de venta libre y los recetados solamente como se lo haya indicado el médico.  · Siga las indicaciones de su médico acerca de cómo cuidar el lugar donde se insertó el catéter.  · Concurra a todas las visitas de seguimiento como se lo haya indicado el médico.  Esta información no tiene como fin reemplazar el consejo del médico. Asegúrese de hacerle al médico cualquier pregunta que tenga.  Document Released: 04/11/2014 Document Revised: 01/28/2018 Document Reviewed: 01/28/2018  Elsevier Interactive Patient Education © 2019 [employer]

## 2019-02-28 DIAGNOSIS — E441 Mild protein-calorie malnutrition: Secondary | ICD-10-CM | POA: Insufficient documentation

## 2019-04-02 ENCOUNTER — Other Ambulatory Visit (INDEPENDENT_AMBULATORY_CARE_PROVIDER_SITE_OTHER): Payer: Self-pay | Admitting: Nurse Practitioner

## 2019-04-02 DIAGNOSIS — Z95820 Peripheral vascular angioplasty status with implants and grafts: Secondary | ICD-10-CM

## 2019-04-02 DIAGNOSIS — N186 End stage renal disease: Secondary | ICD-10-CM

## 2019-04-02 DIAGNOSIS — Z992 Dependence on renal dialysis: Principal | ICD-10-CM

## 2019-04-05 ENCOUNTER — Ambulatory Visit (INDEPENDENT_AMBULATORY_CARE_PROVIDER_SITE_OTHER): Payer: Self-pay | Admitting: Nurse Practitioner

## 2019-04-05 ENCOUNTER — Encounter (INDEPENDENT_AMBULATORY_CARE_PROVIDER_SITE_OTHER): Payer: Self-pay

## 2019-04-12 ENCOUNTER — Encounter (INDEPENDENT_AMBULATORY_CARE_PROVIDER_SITE_OTHER): Payer: Self-pay | Admitting: Nurse Practitioner

## 2019-05-12 ENCOUNTER — Ambulatory Visit (INDEPENDENT_AMBULATORY_CARE_PROVIDER_SITE_OTHER): Payer: Self-pay | Admitting: Nurse Practitioner

## 2019-05-12 ENCOUNTER — Encounter (INDEPENDENT_AMBULATORY_CARE_PROVIDER_SITE_OTHER): Payer: Self-pay

## 2020-01-20 ENCOUNTER — Emergency Department
Admission: EM | Admit: 2020-01-20 | Discharge: 2020-01-21 | Disposition: A | Discharge status: Home / Self Care | Payer: Self-pay | Attending: Emergency Medicine | Admitting: Emergency Medicine

## 2020-01-20 ENCOUNTER — Emergency Department: Payer: Self-pay

## 2020-01-20 ENCOUNTER — Encounter: Payer: Self-pay | Admitting: Emergency Medicine

## 2020-01-20 DIAGNOSIS — I120 Hypertensive chronic kidney disease with stage 5 chronic kidney disease or end stage renal disease: Secondary | ICD-10-CM | POA: Insufficient documentation

## 2020-01-20 DIAGNOSIS — E1122 Type 2 diabetes mellitus with diabetic chronic kidney disease: Secondary | ICD-10-CM | POA: Insufficient documentation

## 2020-01-20 DIAGNOSIS — R0789 Other chest pain: Secondary | ICD-10-CM | POA: Insufficient documentation

## 2020-01-20 DIAGNOSIS — N186 End stage renal disease: Secondary | ICD-10-CM | POA: Insufficient documentation

## 2020-01-20 DIAGNOSIS — Z992 Dependence on renal dialysis: Secondary | ICD-10-CM | POA: Insufficient documentation

## 2020-01-20 DIAGNOSIS — R079 Chest pain, unspecified: Secondary | ICD-10-CM

## 2020-01-20 DIAGNOSIS — Z794 Long term (current) use of insulin: Secondary | ICD-10-CM | POA: Insufficient documentation

## 2020-01-20 DIAGNOSIS — Z79899 Other long term (current) drug therapy: Secondary | ICD-10-CM | POA: Insufficient documentation

## 2020-01-20 DIAGNOSIS — R770 Abnormality of albumin: Secondary | ICD-10-CM | POA: Insufficient documentation

## 2020-01-20 LAB — BASIC METABOLIC PANEL
Anion gap: 12 (ref 5–15)
BUN: 23 mg/dL (ref 8–23)
CO2: 27 mmol/L (ref 22–32)
Calcium: 8.2 mg/dL — ABNORMAL LOW (ref 8.9–10.3)
Chloride: 97 mmol/L — ABNORMAL LOW (ref 98–111)
Creatinine, Ser: 3.69 mg/dL — ABNORMAL HIGH (ref 0.44–1.00)
GFR calc Af Amer: 14 mL/min — ABNORMAL LOW (ref >60)
GFR calc non Af Amer: 12 mL/min — ABNORMAL LOW (ref >60)
Glucose, Bld: 329 mg/dL — ABNORMAL HIGH (ref 70–99)
Potassium: 5 mmol/L (ref 3.5–5.1)
Sodium: 136 mmol/L (ref 135–145)

## 2020-01-20 LAB — CBC WITH DIFFERENTIAL/PLATELET
Abs Immature Granulocytes: 0.02 10*3/uL (ref 0.00–0.07)
Basophils Absolute: 0 10*3/uL (ref 0.0–0.1)
Basophils Relative: 1 %
Eosinophils Absolute: 0.2 10*3/uL (ref 0.0–0.5)
Eosinophils Relative: 2 %
HCT: 36 % (ref 36.0–46.0)
Hemoglobin: 11.6 g/dL — ABNORMAL LOW (ref 12.0–15.0)
Immature Granulocytes: 0 %
Lymphocytes Relative: 22 %
Lymphs Abs: 1.4 10*3/uL (ref 0.7–4.0)
MCH: 29.9 pg (ref 26.0–34.0)
MCHC: 32.2 g/dL (ref 30.0–36.0)
MCV: 92.8 fL (ref 80.0–100.0)
Monocytes Absolute: 0.5 10*3/uL (ref 0.1–1.0)
Monocytes Relative: 8 %
Neutro Abs: 4.3 10*3/uL (ref 1.7–7.7)
Neutrophils Relative %: 67 %
Platelets: 169 10*3/uL (ref 150–400)
RBC: 3.88 MIL/uL (ref 3.87–5.11)
RDW: 13.8 % (ref 11.5–15.5)
WBC: 6.4 10*3/uL (ref 4.0–10.5)
nRBC: 0 % (ref 0.0–0.2)

## 2020-01-20 LAB — TROPONIN I (HIGH SENSITIVITY): Troponin I (High Sensitivity): 8 ng/L (ref <18)

## 2020-01-20 MED ORDER — INSULIN ASPART 100 UNIT/ML ~~LOC~~ SOLN: 3.0000 [IU] | Freq: Once | SUBCUTANEOUS | Status: DC

## 2020-01-20 MED ORDER — ASPIRIN 81 MG PO CHEW: 324.0000 mg | CHEWABLE_TABLET | Freq: Once | ORAL | Status: DC

## 2020-01-20 MED ORDER — NITROGLYCERIN 0.4 MG SL SUBL
0.4000 mg | SUBLINGUAL_TABLET | SUBLINGUAL | Status: AC | PRN
  Administered 2020-01-20 (×2): 0.4 mg via SUBLINGUAL
  Filled 2020-01-20: qty 1

## 2020-01-20 NOTE — ED Provider Notes (Signed)
-----------------------------------------   11:30 PM on 01/20/2020 -----------------------------------------  Assuming care from Dr. Charna Archer.  In short, Anita Contreras is a 64 y.o. female with a chief complaint of chest pain.  Refer to the original H&P for additional details.  The current plan of care is to discharge after second troponin if labs are reassuring and patient stable.   ----------------------------------------- 1:38 AM on 01/21/2020 -----------------------------------------  The patient is feeling well, no additional chest pain.  Repeat troponin has not changed substantially.  Patient is comfortable with plan for discharge and outpatient follow-up.  I gave my usual customary return precautions.   Anita Kehr, MD 01/21/20 [phone]

## 2020-01-20 NOTE — ED Provider Notes (Signed)
[facility] Emergency Department Provider Note   ____________________________________________   First MD Initiated Contact with Patient 01/20/20 2136     (approximate)  I have reviewed the triage vital signs and the nursing notes.   HISTORY  Chief Complaint Chest Pain    HPI Anita Contreras is a 64 y.o. female with past medical history of ESRD on HD, hypertension, hyperlipidemia, and diabetes who presents to the ED complaining of chest pain.  History is limited as patient is Spanish-speaking only, history obtained via interpreter [phone].  Patient reports that a couple of hours ago she had sudden onset of pain over her left chest that feels similar to a pinprick.  It seems to radiate into her left arm and has been present constantly since onset.  She denies any fevers, cough, or shortness of breath.  She received a full run of dialysis earlier today without complication.  She denies any history of similar symptoms, has not had any recent trauma to her chest.        Past Medical History:  Diagnosis Date  . Acute cystitis with hematuria   . Anemia   . Cataracts, bilateral   . CKD (chronic kidney disease)   . Diabetic retinopathy, background ([place])   . Dialysis patient ([place])    Tues, Thurs, Sat  . Dizziness   . Hyperlipidemia   . Hypertension   . Migraine variant   . Neuropathy   . OP (osteoporosis)   . Personal history of noncompliance with medical treatment, presenting hazards to health   . Steal syndrome as complication of dialysis access ([place])   . Stroke ([place])   . Type II diabetes mellitus with ophthalmic manifestations, uncontrolled ([place])   . Vitamin D deficiency     Patient Active Problem List   Diagnosis Date Noted  . HTN (hypertension) 01/26/2018  . HCAP (healthcare-associated pneumonia) 12/23/2017  . Bradycardia 01/17/2017  . Syncope 01/17/2017  . Nausea and vomiting 01/17/2017  . Essential hypertension, malignant 01/17/2017   . Lactic acidosis 01/17/2017  . Leukocytosis 01/17/2017  . Orthostatic hypotension 01/17/2017  . Hyperkalemia 01/14/2017  . Steal syndrome as complication of dialysis access ([place]) 11/08/2016  . ESRD on dialysis ([place]) 10/15/2016  . Renal dialysis device, implant, or graft complication [id number]  . Hyperlipidemia 10/15/2016  . Essential hypertension 10/15/2016  . Controlled diabetes mellitus type 2 with complications ([place]) [id number]  . Acute on chronic renal failure ([place]) 04/23/2016  . 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 Acute right flank pain   . ARF (acute renal failure) ([place]) 04/15/2016  . Acute cystitis 05/26/2015  . Neuropathy 05/26/2015    Past Surgical History:  Procedure Laterality Date  . A/V FISTULAGRAM Right 06/03/2018   Procedure: A/V FISTULAGRAM;  Surgeon: Algernon Huxley, MD;  Location: [place] [facility];  Service: Cardiovascular;  Laterality: Right;  . A/V FISTULAGRAM Right 02/22/2019   Procedure: A/V FISTULAGRAM;  Surgeon: Algernon Huxley, MD;  Location: [place] [facility];  Service: Cardiovascular;  Laterality: Right;  . A/V SHUNT INTERVENTION N/A 01/26/2018   Procedure: A/V SHUNT INTERVENTION;  Surgeon: Algernon Huxley, MD;  Location: [place] [facility];  Service: Cardiovascular;  Laterality: N/A;  . A/V SHUNTOGRAM Right 01/26/2018   Procedure: A/V SHUNTOGRAM;  Surgeon: Algernon Huxley, MD;  Location: [place] [facility];  Service: Cardiovascular;  Laterality: Right;  . AV FISTULA PLACEMENT Right 10/10/2016   Procedure: ARTERIOVENOUS (AV) FISTULA CREATION ( BRACHIOCEPHALIC );  Surgeon: Algernon Huxley, MD;  Location: [facility];  Service: Vascular;  Laterality: Right;  . DILATION AND CURETTAGE OF UTERUS    . DISTAL REVASCULARIZATION AND INTERVAL LIGATION (DRIL) Right 11/08/2016   Procedure: DISTAL REVASCULARIZATION AND INTERVAL LIGATION (DRIL);  Surgeon: Algernon Huxley, MD;  Location: [facility];  Service: Vascular;  Laterality: Right;  .  PERIPHERAL VASCULAR CATHETERIZATION N/A 04/25/2016   Procedure: Dialysis/Perma Catheter Insertion;  Surgeon: Algernon Huxley, MD;  Location: [place] [facility];  Service: Cardiovascular;  Laterality: N/A;  . PERIPHERAL VASCULAR CATHETERIZATION Right 10/17/2016   Procedure: Upper Extremity Angiography;  Surgeon: Algernon Huxley, MD;  Location: [place] [facility];  Service: Cardiovascular;  Laterality: Right;  . UPPER EXTREMITY ANGIOGRAPHY Right 10/12/2018   Procedure: UPPER EXTREMITY ANGIOGRAPHY;  Surgeon: Algernon Huxley, MD;  Location: [place] [facility];  Service: Cardiovascular;  Laterality: Right;    Prior to Admission medications   Medication Sig Start Date End Date Taking? Authorizing Provider  acetaminophen (TYLENOL) 500 MG tablet Take 1,000 mg by mouth every 6 (six) hours as needed for mild pain or headache.    [provider]  amLODipine (NORVASC) 10 MG tablet Take 1 tablet (10 mg total) by mouth daily. 01/27/18   Bettey Costa, MD  aspirin 81 MG EC tablet TAKE 1 TABLET BY MOUTH EVERY DAY Patient not taking: Reported on 10/12/2018 04/15/17   Stegmayer, Joelene Millin A, PA-C  atorvastatin (LIPITOR) 40 MG tablet Take 40 mg by mouth daily at 6 PM.     [provider]  calcium acetate (PHOSLO) 667 MG capsule Take 1 capsule (667 mg total) by mouth 3 (three) times daily with meals. 04/19/16   Fritzi Mandes, MD  docusate sodium (COLACE) 100 MG capsule Take 100 mg by mouth 2 (two) times daily.    [provider]  furosemide (LASIX) 20 MG tablet Take 20 mg by mouth daily.    [provider]  gabapentin (NEURONTIN) 300 MG capsule Take by mouth.    [provider]  hydrALAZINE (APRESOLINE) 50 MG tablet Take 1 tablet (50 mg total) by mouth 3 (three) times daily. 01/27/18   Bettey Costa, MD  hydrOXYzine (ATARAX/VISTARIL) 25 MG tablet Take 25 mg by mouth 2 (two) times daily as needed for itching.    [provider]  insulin NPH-regular Human (NOVOLIN 70/30) (70-30) 100  UNIT/ML injection Inject 6 Units into the skin 2 (two) times daily with a meal.     [provider]  insulin regular (HUMULIN R) 100 units/mL injection Inject into the skin.    [provider]  lidocaine-prilocaine (EMLA) cream Apply 1 application topically as needed.    [provider]  lisinopril (PRINIVIL,ZESTRIL) 40 MG tablet Take by mouth.    [provider]  losartan (COZAAR) 50 MG tablet Take 1 tablet (50 mg total) by mouth daily. Patient not taking: Reported on 02/15/2019 01/27/18   Bettey Costa, MD  Nutritional Supplements (FEEDING SUPPLEMENT, NEPRO CARB STEADY,) LIQD Take 237 mLs by mouth 2 (two) times daily between meals. 05/07/16   Epifanio Lesches, MD  sodium bicarbonate 650 MG tablet Take 1,300 mg by mouth 2 (two) times daily.    [provider]    Allergies Propofol  Family History  Problem Relation Age of Onset  . Hypertension Mother   . Hyperlipidemia Mother     Social History Social History   Tobacco Use  . Smoking status: Never Smoker  . Smokeless tobacco: Never Used  Substance Use Topics  . Alcohol use: No  . Drug use: No    Review of Systems  Constitutional: No fever/chills Eyes: No visual changes. ENT: No sore throat. Cardiovascular: Positive for chest pain. Respiratory: Denies shortness of breath. Gastrointestinal: No abdominal pain.  No nausea, no vomiting.  No diarrhea.  No constipation. Genitourinary: Negative for dysuria. Musculoskeletal: Negative for back pain. Skin: Negative for rash. Neurological: Negative for headaches, focal weakness or numbness.  ____________________________________________   PHYSICAL EXAM:  VITAL SIGNS: ED Triage Vitals  Enc Vitals Group     BP      Pulse      Resp      Temp      Temp src      SpO2      Weight      Height      Head Circumference      Peak Flow      Pain Score      Pain Loc      Pain Edu?      Excl. in [place]?     Constitutional: Alert and  oriented. Eyes: Conjunctivae are normal. Head: Atraumatic. Nose: No congestion/rhinnorhea. Mouth/Throat: Mucous membranes are moist. Neck: Normal ROM Cardiovascular: Normal rate, regular rhythm. Grossly normal heart sounds.  Right upper extremity AV fistula with palpable thrill. Respiratory: Normal respiratory effort.  No retractions. Lungs CTAB.  Central chest wall tenderness to palpation. Gastrointestinal: Soft and nontender. No distention. Genitourinary: deferred Musculoskeletal: No lower extremity tenderness nor edema. Neurologic:  Normal speech and language. No gross focal neurologic deficits are appreciated. Skin:  Skin is warm, dry and intact. No rash noted. Psychiatric: Mood and affect are normal. Speech and behavior are normal.  ____________________________________________   LABS (all labs ordered are listed, but only abnormal results are displayed)  Labs Reviewed  BASIC METABOLIC PANEL - Abnormal; Notable for the following components:      Result Value   Chloride 97 (*)    Glucose, Bld 329 (*)    Creatinine, Ser 3.69 (*)    Calcium 8.2 (*)    GFR calc non Af Amer 12 (*)    GFR calc Af Amer 14 (*)    All other components within normal limits  CBC WITH DIFFERENTIAL/PLATELET - Abnormal; Notable for the following components:   Hemoglobin 11.6 (*)    All other components within normal limits  TROPONIN I (HIGH SENSITIVITY)  TROPONIN I (HIGH SENSITIVITY)   ____________________________________________  EKG  ED ECG REPORT I, Blake Divine, the attending physician, personally viewed and interpreted this ECG.   Date: 01/21/2020  EKG Time: 21:48  Rate: 63  Rhythm: normal sinus rhythm  Axis: Normal  Intervals:none  ST&T Change: None   PROCEDURES  Procedure(s) performed (including Critical Care):  Procedures   ____________________________________________   INITIAL IMPRESSION / ASSESSMENT AND PLAN / ED COURSE       64 year old female with history of ESRD  on HD presents to the ED for acute onset of perking pain in the center of her chest while at rest earlier this evening.  EKG without acute ischemic changes and initial troponin negative.  Remainder of labs are unremarkable and chest x-ray negative for acute process.  Symptoms sound very atypical for ACS and if repeat troponin within normal limits she would be appropriate for discharge home with PCP follow-up.  Patient turned over to oncoming provider pending repeat troponin.      ____________________________________________   FINAL CLINICAL IMPRESSION(S) / ED DIAGNOSES  Final  diagnoses:  Chest pain     ED Discharge Orders    None       Note:  This document was prepared using Dragon voice recognition software and may include unintentional dictation errors.   Blake Divine, MD 01/21/20 0020

## 2020-01-20 NOTE — ED Notes (Signed)
Pt given first nitro for a pain of 8/10. After 5 mins pain down to 5/10. With 2nd nitro pain pain came down to 2/10

## 2020-01-21 LAB — TROPONIN I (HIGH SENSITIVITY): Troponin I (High Sensitivity): 9 ng/L (ref <18)

## 2020-01-21 NOTE — ED Notes (Signed)
Pt assisted to restroom. Pt able to ambulate independently without assistance and c/o minimal pain

## 2020-05-19 ENCOUNTER — Other Ambulatory Visit (INDEPENDENT_AMBULATORY_CARE_PROVIDER_SITE_OTHER): Payer: Self-pay | Admitting: Vascular Surgery

## 2020-05-19 DIAGNOSIS — Z95820 Peripheral vascular angioplasty status with implants and grafts: Secondary | ICD-10-CM

## 2020-05-19 DIAGNOSIS — T829XXS Unspecified complication of cardiac and vascular prosthetic device, implant and graft, sequela: Secondary | ICD-10-CM

## 2020-05-22 ENCOUNTER — Ambulatory Visit (INDEPENDENT_AMBULATORY_CARE_PROVIDER_SITE_OTHER): Payer: Self-pay | Admitting: Nurse Practitioner

## 2020-05-22 ENCOUNTER — Encounter (INDEPENDENT_AMBULATORY_CARE_PROVIDER_SITE_OTHER): Payer: Self-pay | Admitting: Nurse Practitioner

## 2020-05-22 ENCOUNTER — Ambulatory Visit (INDEPENDENT_AMBULATORY_CARE_PROVIDER_SITE_OTHER): Payer: Self-pay

## 2020-05-22 ENCOUNTER — Other Ambulatory Visit: Payer: Self-pay

## 2020-05-22 VITALS — BP 146/65 | HR 62 | Ht <= 58 in | Wt 116.0 lb

## 2020-05-22 DIAGNOSIS — T829XXS Unspecified complication of cardiac and vascular prosthetic device, implant and graft, sequela: Secondary | ICD-10-CM

## 2020-05-22 DIAGNOSIS — Z95820 Peripheral vascular angioplasty status with implants and grafts: Secondary | ICD-10-CM

## 2020-05-22 DIAGNOSIS — N186 End stage renal disease: Secondary | ICD-10-CM

## 2020-05-22 DIAGNOSIS — Z992 Dependence on renal dialysis: Secondary | ICD-10-CM

## 2020-05-22 DIAGNOSIS — I10 Essential (primary) hypertension: Secondary | ICD-10-CM

## 2020-05-22 DIAGNOSIS — E118 Type 2 diabetes mellitus with unspecified complications: Secondary | ICD-10-CM

## 2020-05-22 NOTE — Progress Notes (Signed)
Subjective:    Patient ID: Anita Contreras, female    DOB: May 09, 1956, 64 y.o.   MRN: [id number] Chief Complaint  Patient presents with  . Follow-up    u/s follow up    The patient returns to the office for followup of their dialysis access. The function of the access has been stable. The patient denies increased bleeding time or increased recirculation. Patient denies difficulty with cannulation. The patient denies hand pain or other symptoms consistent with steal phenomena.  No significant arm swelling.  The patient has a history of a previous drill placement in 2017 for steal syndrome.  The patient's most recent intervention was on 02/22/2019 however the patient has been lost to follow-up since that intervention.  The patient denies redness or swelling at the access site. The patient denies fever or chills at home or while on dialysis.  The patient denies amaurosis fugax or recent TIA symptoms. There are no recent neurological changes noted. The patient denies claudication symptoms or rest pain symptoms. The patient denies history of DVT, PE or superficial thrombophlebitis. The patient denies recent episodes of angina or shortness of breath.    Today the patient has a flow volume of 761.  The right brachiocephalic AV fistula and stent with narrowing to 0.19 cm and increased velocities in the AV fistula that were seen previously.  Previous review of the patient's noninvasive studies over the years reveals that her flow volumes have typically been within the 7-800 range.     Review of Systems  Hematological: Bruises/bleeds easily (Only when she moves around during dialysis).  All other systems reviewed and are negative.      Objective:   Physical Exam Vitals reviewed. Exam conducted with a chaperone present (Interpreter present).  HENT:     Head: Normocephalic.  Cardiovascular:     Rate and Rhythm: Normal rate and regular rhythm.     Pulses: Normal pulses.           Radial pulses are 2+ on the left side.     Heart sounds: Normal heart sounds.     Arteriovenous access: right arteriovenous access is present.    Comments: Right brachiocephalic AV fistula with previous dril procedure.  Good thrill and bruit Pulmonary:     Effort: Pulmonary effort is normal.     Breath sounds: Normal breath sounds.  Neurological:     Mental Status: She is alert and oriented to person, place, and time.  Psychiatric:        Mood and Affect: Mood normal.        Behavior: Behavior normal.        Thought Content: Thought content normal.        Judgment: Judgment normal.     BP (!) 146/65   Pulse 62   Ht 4\' 1"  (1.245 m)   Wt 116 lb (52.6 kg)   LMP 09/19/2001 (Approximate)   BMI 33.97 kg/m   Past Medical History:  Diagnosis Date  . Acute cystitis with hematuria   . Anemia   . Cataracts, bilateral   . CKD (chronic kidney disease)   . Diabetic retinopathy, background ([place])   . Dialysis patient ([place])    Tues, Thurs, Sat  . Dizziness   . Hyperlipidemia   . Hypertension   . Migraine variant   . Neuropathy   . OP (osteoporosis)   . Personal history of noncompliance with medical treatment, presenting hazards to health   . Steal syndrome as complication of dialysis  access [facility])   . Stroke ([place])   . Type II diabetes mellitus with ophthalmic manifestations, uncontrolled ([place])   . Vitamin D deficiency     Social History   Socioeconomic History  . Marital status: Widowed    Spouse name: Not on file  . Number of children: 4  . Years of education: Not on file  . Highest education level: Not on file  Occupational History  . Not on file  Tobacco Use  . Smoking status: Never Smoker  . Smokeless tobacco: Never Used  Substance and Sexual Activity  . Alcohol use: No  . Drug use: No  . Sexual activity: Never    Birth control/protection: Abstinence  Other Topics Concern  . Not on file  Social History Narrative   Lives at home with family   Social Determinants  of Health   Financial Resource Strain:   . Difficulty of Paying Living Expenses:   Food Insecurity:   . Worried About Charity fundraiser in the Last Year:   . Arboriculturist in the Last Year:   Transportation Needs:   . Film/video editor (Medical):   Marland Kitchen Lack of Transportation (Non-Medical):   Physical Activity:   . Days of Exercise per Week:   . Minutes of Exercise per Session:   Stress:   . Feeling of Stress :   Social Connections:   . Frequency of Communication with Friends and Family:   . Frequency of Social Gatherings with Friends and Family:   . Attends Religious Services:   . Active Member of Clubs or Organizations:   . Attends Archivist Meetings:   Marland Kitchen Marital Status:   Intimate Partner Violence:   . Fear of Current or Ex-Partner:   . Emotionally Abused:   Marland Kitchen Physically Abused:   . Sexually Abused:     Past Surgical History:  Procedure Laterality Date  . A/V FISTULAGRAM Right 06/03/2018   Procedure: A/V FISTULAGRAM;  Surgeon: Algernon Huxley, MD;  Location: [place] [facility];  Service: Cardiovascular;  Laterality: Right;  . A/V FISTULAGRAM Right 02/22/2019   Procedure: A/V FISTULAGRAM;  Surgeon: Algernon Huxley, MD;  Location: [place] [facility];  Service: Cardiovascular;  Laterality: Right;  . A/V SHUNT INTERVENTION N/A 01/26/2018   Procedure: A/V SHUNT INTERVENTION;  Surgeon: Algernon Huxley, MD;  Location: [place] [facility];  Service: Cardiovascular;  Laterality: N/A;  . A/V SHUNTOGRAM Right 01/26/2018   Procedure: A/V SHUNTOGRAM;  Surgeon: Algernon Huxley, MD;  Location: [place] [facility];  Service: Cardiovascular;  Laterality: Right;  . AV FISTULA PLACEMENT Right 10/10/2016   Procedure: ARTERIOVENOUS (AV) FISTULA CREATION ( BRACHIOCEPHALIC );  Surgeon: Algernon Huxley, MD;  Location: [facility];  Service: Vascular;  Laterality: Right;  . DILATION AND CURETTAGE OF UTERUS    . DISTAL REVASCULARIZATION AND INTERVAL LIGATION (DRIL) Right 11/08/2016   Procedure:  DISTAL REVASCULARIZATION AND INTERVAL LIGATION (DRIL);  Surgeon: Algernon Huxley, MD;  Location: [facility];  Service: Vascular;  Laterality: Right;  . PERIPHERAL VASCULAR CATHETERIZATION N/A 04/25/2016   Procedure: Dialysis/Perma Catheter Insertion;  Surgeon: Algernon Huxley, MD;  Location: [place] [facility];  Service: Cardiovascular;  Laterality: N/A;  . PERIPHERAL VASCULAR CATHETERIZATION Right 10/17/2016   Procedure: Upper Extremity Angiography;  Surgeon: Algernon Huxley, MD;  Location: [place] [facility];  Service: Cardiovascular;  Laterality: Right;  . UPPER EXTREMITY ANGIOGRAPHY Right 10/12/2018   Procedure: UPPER EXTREMITY ANGIOGRAPHY;  Surgeon:  Algernon Huxley, MD;  Location: [place] [facility];  Service: Cardiovascular;  Laterality: Right;    Family History  Problem Relation Age of Onset  . Hypertension Mother   . Hyperlipidemia Mother     Allergies  Allergen Reactions  . Dextromethorphan-Guaifenesin     Other reaction(s): Itching of skin (finding)  . Propofol Itching    Pt says " when getting anesthesia by body itches" unsure what type of anesthesia       Assessment & Plan:   1. ESRD on dialysis [facility]) Recommend:  The patient is doing well and currently has adequate dialysis access. The patient's dialysis center is not reporting any major access issues.  However, the flow rates in the patient's dialysis access are low, in the prethrombotic range, and there is a known stenosis that was previously seen on ultrasound over a year ago.  This raises concerns that the access is at moderate but not high risk for a problem or thrombosis and should be followed more closely  The patient will follow-up with me in the office in 3 months without an HDA   2. Essential hypertension Continue antihypertensive medications as already ordered, these medications have been reviewed and there are no changes at this time.   3. Controlled diabetes mellitus type 2 with complications, unspecified whether  long term insulin use ([place]) Continue hypoglycemic medications as already ordered, these medications have been reviewed and there are no changes at this time.  Hgb A1C to be monitored as already arranged by primary service    Current Outpatient Medications on File Prior to Visit  Medication Sig Dispense Refill  . acetaminophen (TYLENOL) 500 MG tablet Take 1,000 mg by mouth every 6 (six) hours as needed for mild pain or headache.    Marland Kitchen amLODipine (NORVASC) 10 MG tablet Take 1 tablet (10 mg total) by mouth daily. 30 tablet 0  . calcium acetate (PHOSLO) 667 MG capsule Take 1 capsule (667 mg total) by mouth 3 (three) times daily with meals. 90 capsule 0  . lidocaine-prilocaine (EMLA) cream Apply 1 application topically as needed.    Marland Kitchen aspirin 81 MG EC tablet TAKE 1 TABLET BY MOUTH EVERY DAY (Patient not taking: Reported on 10/12/2018) 150 tablet 0  . atorvastatin (LIPITOR) 40 MG tablet Take 40 mg by mouth daily at 6 PM.  (Patient not taking: Reported on 05/22/2020)    . docusate sodium (COLACE) 100 MG capsule Take 100 mg by mouth 2 (two) times daily. (Patient not taking: Reported on 05/22/2020)    . furosemide (LASIX) 20 MG tablet Take 20 mg by mouth daily. (Patient not taking: Reported on 05/22/2020)    . gabapentin (NEURONTIN) 300 MG capsule Take by mouth. (Patient not taking: Reported on 05/22/2020)    . hydrALAZINE (APRESOLINE) 50 MG tablet Take 1 tablet (50 mg total) by mouth 3 (three) times daily. 90 tablet 0  . hydrOXYzine (ATARAX/VISTARIL) 25 MG tablet Take 25 mg by mouth 2 (two) times daily as needed for itching.    . insulin NPH-regular Human (NOVOLIN 70/30) (70-30) 100 UNIT/ML injection Inject 6 Units into the skin 2 (two) times daily with a meal.  (Patient not taking: Reported on 05/22/2020)    . insulin regular (HUMULIN R) 100 units/mL injection Inject into the skin. (Patient not taking: Reported on 05/22/2020)    . lisinopril (PRINIVIL,ZESTRIL) 40 MG tablet Take by mouth. (Patient not taking:  Reported on 05/22/2020)    . losartan (COZAAR) 50 MG tablet Take 1  tablet (50 mg total) by mouth daily. (Patient not taking: Reported on 02/15/2019) 30 tablet 0  . Nutritional Supplements (FEEDING SUPPLEMENT, NEPRO CARB STEADY,) LIQD Take 237 mLs by mouth 2 (two) times daily between meals. (Patient not taking: Reported on 05/22/2020) 60 Can 0  . sodium bicarbonate 650 MG tablet Take 1,300 mg by mouth 2 (two) times daily. (Patient not taking: Reported on 05/22/2020)     No current facility-administered medications on file prior to visit.    There are no Patient Instructions on file for this visit. No follow-ups on file.   Kris Hartmann, NP

## 2020-07-06 ENCOUNTER — Other Ambulatory Visit
Admission: RE | Admit: 2020-07-06 | Discharge: 2020-07-06 | Disposition: A | Discharge status: Home / Self Care | Payer: Self-pay | Source: Ambulatory Visit | Attending: Nephrology | Admitting: Nephrology

## 2020-07-06 DIAGNOSIS — N186 End stage renal disease: Secondary | ICD-10-CM | POA: Insufficient documentation

## 2020-07-06 LAB — POTASSIUM: Potassium: 5.8 mmol/L — ABNORMAL HIGH (ref 3.5–5.1)

## 2020-08-07 ENCOUNTER — Emergency Department: Payer: Medicaid Other

## 2020-08-07 ENCOUNTER — Inpatient Hospital Stay
Admission: EM | Admit: 2020-08-07 | Discharge: 2020-08-11 | DRG: 177 | Disposition: A | Discharge status: Home / Self Care | Payer: Medicaid Other | Attending: Internal Medicine | Admitting: Internal Medicine

## 2020-08-07 ENCOUNTER — Other Ambulatory Visit: Payer: Self-pay

## 2020-08-07 ENCOUNTER — Encounter: Payer: Self-pay | Admitting: Emergency Medicine

## 2020-08-07 DIAGNOSIS — Z992 Dependence on renal dialysis: Secondary | ICD-10-CM

## 2020-08-07 DIAGNOSIS — Z79899 Other long term (current) drug therapy: Secondary | ICD-10-CM

## 2020-08-07 DIAGNOSIS — I10 Essential (primary) hypertension: Secondary | ICD-10-CM | POA: Diagnosis not present

## 2020-08-07 DIAGNOSIS — J1282 Pneumonia due to coronavirus disease 2019: Secondary | ICD-10-CM | POA: Diagnosis present

## 2020-08-07 DIAGNOSIS — E559 Vitamin D deficiency, unspecified: Secondary | ICD-10-CM | POA: Diagnosis present

## 2020-08-07 DIAGNOSIS — N2581 Secondary hyperparathyroidism of renal origin: Secondary | ICD-10-CM | POA: Diagnosis present

## 2020-08-07 DIAGNOSIS — N186 End stage renal disease: Secondary | ICD-10-CM | POA: Diagnosis present

## 2020-08-07 DIAGNOSIS — Z8673 Personal history of transient ischemic attack (TIA), and cerebral infarction without residual deficits: Secondary | ICD-10-CM | POA: Diagnosis not present

## 2020-08-07 DIAGNOSIS — E875 Hyperkalemia: Secondary | ICD-10-CM | POA: Diagnosis present

## 2020-08-07 DIAGNOSIS — M810 Age-related osteoporosis without current pathological fracture: Secondary | ICD-10-CM | POA: Diagnosis present

## 2020-08-07 DIAGNOSIS — R5081 Fever presenting with conditions classified elsewhere: Secondary | ICD-10-CM

## 2020-08-07 DIAGNOSIS — E1122 Type 2 diabetes mellitus with diabetic chronic kidney disease: Secondary | ICD-10-CM | POA: Diagnosis present

## 2020-08-07 DIAGNOSIS — E11319 Type 2 diabetes mellitus with unspecified diabetic retinopathy without macular edema: Secondary | ICD-10-CM | POA: Diagnosis present

## 2020-08-07 DIAGNOSIS — R1310 Dysphagia, unspecified: Secondary | ICD-10-CM | POA: Diagnosis present

## 2020-08-07 DIAGNOSIS — IMO0002 Reserved for concepts with insufficient information to code with codable children: Secondary | ICD-10-CM

## 2020-08-07 DIAGNOSIS — I120 Hypertensive chronic kidney disease with stage 5 chronic kidney disease or end stage renal disease: Secondary | ICD-10-CM | POA: Diagnosis present

## 2020-08-07 DIAGNOSIS — Z794 Long term (current) use of insulin: Secondary | ICD-10-CM

## 2020-08-07 DIAGNOSIS — J9600 Acute respiratory failure, unspecified whether with hypoxia or hypercapnia: Secondary | ICD-10-CM

## 2020-08-07 DIAGNOSIS — U071 COVID-19: Secondary | ICD-10-CM | POA: Diagnosis present

## 2020-08-07 DIAGNOSIS — D72810 Lymphocytopenia: Secondary | ICD-10-CM | POA: Diagnosis present

## 2020-08-07 DIAGNOSIS — T82898A Other specified complication of vascular prosthetic devices, implants and grafts, initial encounter: Secondary | ICD-10-CM | POA: Diagnosis present

## 2020-08-07 DIAGNOSIS — E785 Hyperlipidemia, unspecified: Secondary | ICD-10-CM | POA: Diagnosis present

## 2020-08-07 DIAGNOSIS — R531 Weakness: Secondary | ICD-10-CM | POA: Diagnosis not present

## 2020-08-07 DIAGNOSIS — Z7982 Long term (current) use of aspirin: Secondary | ICD-10-CM

## 2020-08-07 DIAGNOSIS — R112 Nausea with vomiting, unspecified: Secondary | ICD-10-CM | POA: Diagnosis not present

## 2020-08-07 DIAGNOSIS — R0902 Hypoxemia: Secondary | ICD-10-CM

## 2020-08-07 DIAGNOSIS — J9601 Acute respiratory failure with hypoxia: Secondary | ICD-10-CM | POA: Diagnosis present

## 2020-08-07 DIAGNOSIS — D631 Anemia in chronic kidney disease: Secondary | ICD-10-CM | POA: Diagnosis present

## 2020-08-07 DIAGNOSIS — I160 Hypertensive urgency: Secondary | ICD-10-CM | POA: Diagnosis present

## 2020-08-07 LAB — FIBRIN DERIVATIVES D-DIMER (ARMC ONLY): Fibrin derivatives D-dimer (ARMC): 1240.78 ng{FEU}/mL — ABNORMAL HIGH (ref 0.00–499.00)

## 2020-08-07 LAB — COMPREHENSIVE METABOLIC PANEL
ALT: 19 U/L (ref 0–44)
AST: 40 U/L (ref 15–41)
Albumin: 3.9 g/dL (ref 3.5–5.0)
Alkaline Phosphatase: 102 U/L (ref 38–126)
Anion gap: 13 (ref 5–15)
BUN: 59 mg/dL — ABNORMAL HIGH (ref 8–23)
CO2: 20 mmol/L — ABNORMAL LOW (ref 22–32)
Calcium: 7.9 mg/dL — ABNORMAL LOW (ref 8.9–10.3)
Chloride: 103 mmol/L (ref 98–111)
Creatinine, Ser: 10.07 mg/dL — ABNORMAL HIGH (ref 0.44–1.00)
GFR calc Af Amer: 4 mL/min — ABNORMAL LOW (ref >60)
GFR calc non Af Amer: 4 mL/min — ABNORMAL LOW (ref >60)
Glucose, Bld: 183 mg/dL — ABNORMAL HIGH (ref 70–99)
Potassium: 7.2 mmol/L (ref 3.5–5.1)
Sodium: 136 mmol/L (ref 135–145)
Total Bilirubin: 1.2 mg/dL (ref 0.3–1.2)
Total Protein: 7.1 g/dL (ref 6.5–8.1)

## 2020-08-07 LAB — CBC WITH DIFFERENTIAL/PLATELET
Abs Immature Granulocytes: 0.07 10*3/uL (ref 0.00–0.07)
Basophils Absolute: 0 10*3/uL (ref 0.0–0.1)
Basophils Relative: 0 %
Eosinophils Absolute: 0 10*3/uL (ref 0.0–0.5)
Eosinophils Relative: 0 %
HCT: 27.4 % — ABNORMAL LOW (ref 36.0–46.0)
Hemoglobin: 8.9 g/dL — ABNORMAL LOW (ref 12.0–15.0)
Immature Granulocytes: 1 %
Lymphocytes Relative: 8 %
Lymphs Abs: 0.6 10*3/uL — ABNORMAL LOW (ref 0.7–4.0)
MCH: 32.1 pg (ref 26.0–34.0)
MCHC: 32.5 g/dL (ref 30.0–36.0)
MCV: 98.9 fL (ref 80.0–100.0)
Monocytes Absolute: 0.3 10*3/uL (ref 0.1–1.0)
Monocytes Relative: 4 %
Neutro Abs: 6.4 10*3/uL (ref 1.7–7.7)
Neutrophils Relative %: 87 %
Platelets: 175 10*3/uL (ref 150–400)
RBC: 2.77 MIL/uL — ABNORMAL LOW (ref 3.87–5.11)
RDW: 14.8 % (ref 11.5–15.5)
WBC: 7.3 10*3/uL (ref 4.0–10.5)
nRBC: 0 % (ref 0.0–0.2)

## 2020-08-07 LAB — SARS CORONAVIRUS 2 BY RT PCR (HOSPITAL ORDER, PERFORMED IN ~~LOC~~ HOSPITAL LAB): SARS Coronavirus 2: POSITIVE — AB

## 2020-08-07 LAB — URINALYSIS, COMPLETE (UACMP) WITH MICROSCOPIC
Bacteria, UA: NONE SEEN
Bilirubin Urine: NEGATIVE
Glucose, UA: NEGATIVE mg/dL
Hgb urine dipstick: NEGATIVE
Ketones, ur: NEGATIVE mg/dL
Leukocytes,Ua: NEGATIVE
Nitrite: NEGATIVE
Protein, ur: >=300 mg/dL — AB
Specific Gravity, Urine: 1.013 (ref 1.005–1.030)
pH: 8 (ref 5.0–8.0)

## 2020-08-07 LAB — C-REACTIVE PROTEIN: CRP: 5 mg/dL — ABNORMAL HIGH (ref <1.0)

## 2020-08-07 LAB — FERRITIN: Ferritin: 1477 ng/mL — ABNORMAL HIGH (ref 11–307)

## 2020-08-07 LAB — PROCALCITONIN: Procalcitonin: 0.62 ng/mL

## 2020-08-07 LAB — TROPONIN I (HIGH SENSITIVITY)
Troponin I (High Sensitivity): 19 ng/L — ABNORMAL HIGH (ref <18)
Troponin I (High Sensitivity): 23 ng/L — ABNORMAL HIGH

## 2020-08-07 LAB — GLUCOSE, CAPILLARY
Glucose-Capillary: 164 mg/dL — ABNORMAL HIGH (ref 70–99)
Glucose-Capillary: 181 mg/dL — ABNORMAL HIGH (ref 70–99)

## 2020-08-07 LAB — ABO/RH: ABO/RH(D): O POS

## 2020-08-07 LAB — LACTIC ACID, PLASMA: Lactic Acid, Venous: 1.4 mmol/L (ref 0.5–1.9)

## 2020-08-07 MED ORDER — INSULIN ASPART 100 UNIT/ML ~~LOC~~ SOLN
0.0000 [IU] | Freq: Three times a day (TID) | SUBCUTANEOUS | Status: DC
  Administered 2020-08-07 – 2020-08-08 (×2): 1 [IU] via SUBCUTANEOUS
  Administered 2020-08-08: 18:00:00 4 [IU] via SUBCUTANEOUS
  Administered 2020-08-08: 12:00:00 2 [IU] via SUBCUTANEOUS
  Administered 2020-08-09: 3 [IU] via SUBCUTANEOUS
  Administered 2020-08-09: 10:00:00 1 [IU] via SUBCUTANEOUS
  Administered 2020-08-09: 13:00:00 3 [IU] via SUBCUTANEOUS
  Administered 2020-08-10: 09:00:00 5 [IU] via SUBCUTANEOUS
  Administered 2020-08-11: 13:00:00 1 [IU] via SUBCUTANEOUS
  Filled 2020-08-07 (×9): qty 1

## 2020-08-07 MED ORDER — HEPARIN SODIUM (PORCINE) 5000 UNIT/ML IJ SOLN
5000.0000 [IU] | Freq: Three times a day (TID) | INTRAMUSCULAR | Status: DC
  Administered 2020-08-07 – 2020-08-11 (×12): 5000 [IU] via SUBCUTANEOUS
  Filled 2020-08-07 (×12): qty 1

## 2020-08-07 MED ORDER — DEXAMETHASONE SODIUM PHOSPHATE 10 MG/ML IJ SOLN
6.0000 mg | Freq: Once | INTRAMUSCULAR | Status: AC
  Administered 2020-08-07: 6 mg via INTRAVENOUS
  Filled 2020-08-07: qty 1

## 2020-08-07 MED ORDER — SODIUM ZIRCONIUM CYCLOSILICATE 10 G PO PACK
10.0000 g | PACK | Freq: Three times a day (TID) | ORAL | Status: DC
  Administered 2020-08-07 (×3): 10 g via ORAL
  Filled 2020-08-07 (×5): qty 1

## 2020-08-07 MED ORDER — SODIUM CHLORIDE 0.9% FLUSH
3.0000 mL | Freq: Two times a day (BID) | INTRAVENOUS | Status: DC
  Administered 2020-08-07 – 2020-08-11 (×8): 3 mL via INTRAVENOUS

## 2020-08-07 MED ORDER — SODIUM CHLORIDE 0.9 % IV SOLN
200.0000 mg | Freq: Once | INTRAVENOUS | Status: AC
  Administered 2020-08-07: 200 mg via INTRAVENOUS
  Filled 2020-08-07: qty 200

## 2020-08-07 MED ORDER — ACETAMINOPHEN 325 MG PO TABS
650.0000 mg | ORAL_TABLET | Freq: Four times a day (QID) | ORAL | Status: DC | PRN
  Administered 2020-08-07 – 2020-08-11 (×4): 650 mg via ORAL
  Filled 2020-08-07 (×4): qty 2

## 2020-08-07 MED ORDER — FUROSEMIDE 10 MG/ML IJ SOLN
40.0000 mg | Freq: Once | INTRAMUSCULAR | Status: AC
  Administered 2020-08-07: 40 mg via INTRAVENOUS
  Filled 2020-08-07: qty 4

## 2020-08-07 MED ORDER — HYDRALAZINE HCL 50 MG PO TABS
50.0000 mg | ORAL_TABLET | Freq: Three times a day (TID) | ORAL | Status: DC
  Administered 2020-08-07 – 2020-08-09 (×3): 50 mg via ORAL
  Filled 2020-08-07 (×4): qty 1

## 2020-08-07 MED ORDER — ATORVASTATIN CALCIUM 20 MG PO TABS
40.0000 mg | ORAL_TABLET | Freq: Every day | ORAL | Status: DC
  Administered 2020-08-07 – 2020-08-10 (×4): 40 mg via ORAL
  Filled 2020-08-07 (×4): qty 2

## 2020-08-07 MED ORDER — ONDANSETRON HCL 4 MG PO TABS: 4.0000 mg | ORAL_TABLET | Freq: Four times a day (QID) | ORAL | Status: DC | PRN

## 2020-08-07 MED ORDER — ACETAMINOPHEN 500 MG PO TABS
1000.0000 mg | ORAL_TABLET | Freq: Once | ORAL | Status: AC
  Administered 2020-08-07: 1000 mg via ORAL
  Filled 2020-08-07: qty 2

## 2020-08-07 MED ORDER — AMLODIPINE BESYLATE 5 MG PO TABS
10.0000 mg | ORAL_TABLET | Freq: Every day | ORAL | Status: DC
  Administered 2020-08-07 – 2020-08-09 (×2): 10 mg via ORAL
  Filled 2020-08-07 (×3): qty 2

## 2020-08-07 MED ORDER — POLYETHYLENE GLYCOL 3350 17 G PO PACK: 17.0000 g | PACK | Freq: Every day | ORAL | Status: DC | PRN

## 2020-08-07 MED ORDER — DEXTROSE 50 % IV SOLN
1.0000 | Freq: Once | INTRAVENOUS | Status: AC
  Administered 2020-08-07: 50 mL via INTRAVENOUS
  Filled 2020-08-07: qty 50

## 2020-08-07 MED ORDER — INSULIN ASPART 100 UNIT/ML IV SOLN
10.0000 [IU] | Freq: Once | INTRAVENOUS | Status: AC
  Filled 2020-08-07: qty 0.1

## 2020-08-07 MED ORDER — HYDROXYZINE HCL 25 MG PO TABS
25.0000 mg | ORAL_TABLET | Freq: Two times a day (BID) | ORAL | Status: DC | PRN
  Filled 2020-08-07: qty 1

## 2020-08-07 MED ORDER — DEXAMETHASONE SODIUM PHOSPHATE 10 MG/ML IJ SOLN
6.0000 mg | INTRAMUSCULAR | Status: DC
  Administered 2020-08-07 – 2020-08-11 (×5): 6 mg via INTRAVENOUS
  Filled 2020-08-07 (×4): qty 1

## 2020-08-07 MED ORDER — CALCIUM GLUCONATE-NACL 1-0.675 GM/50ML-% IV SOLN
1.0000 g | Freq: Once | INTRAVENOUS | Status: AC
  Administered 2020-08-07: 1000 mg via INTRAVENOUS
  Filled 2020-08-07: qty 50

## 2020-08-07 MED ORDER — FUROSEMIDE 40 MG PO TABS
20.0000 mg | ORAL_TABLET | Freq: Every day | ORAL | Status: DC
  Administered 2020-08-07 – 2020-08-11 (×3): 20 mg via ORAL
  Filled 2020-08-07 (×4): qty 1

## 2020-08-07 MED ORDER — DEXAMETHASONE SODIUM PHOSPHATE 10 MG/ML IJ SOLN: 6.0000 mg | Freq: Once | INTRAMUSCULAR | Status: DC

## 2020-08-07 MED ORDER — SODIUM CHLORIDE 0.9 % IV SOLN: 250.0000 mL | INTRAVENOUS | Status: DC | PRN

## 2020-08-07 MED ORDER — EPOETIN ALFA 10000 UNIT/ML IJ SOLN
10000.0000 [IU] | INTRAMUSCULAR | Status: DC
  Administered 2020-08-08 – 2020-08-10 (×2): 10000 [IU] via INTRAVENOUS
  Filled 2020-08-07 (×3): qty 1

## 2020-08-07 MED ORDER — ONDANSETRON HCL 4 MG/2ML IJ SOLN
4.0000 mg | Freq: Four times a day (QID) | INTRAMUSCULAR | Status: DC | PRN
  Administered 2020-08-08 – 2020-08-10 (×4): 4 mg via INTRAVENOUS
  Filled 2020-08-07 (×5): qty 2

## 2020-08-07 MED ORDER — SODIUM CHLORIDE 0.9 % IV SOLN
100.0000 mg | Freq: Every day | INTRAVENOUS | Status: AC
  Administered 2020-08-08 – 2020-08-11 (×4): 100 mg via INTRAVENOUS
  Filled 2020-08-07 (×4): qty 20

## 2020-08-07 MED ORDER — LOPERAMIDE HCL 2 MG PO CAPS
2.0000 mg | ORAL_CAPSULE | ORAL | Status: DC | PRN
  Administered 2020-08-07 – 2020-08-11 (×2): 2 mg via ORAL
  Filled 2020-08-07 (×2): qty 1

## 2020-08-07 MED ORDER — SODIUM CHLORIDE 0.9% FLUSH: 3.0000 mL | INTRAVENOUS | Status: DC | PRN

## 2020-08-07 MED ORDER — CALCIUM ACETATE (PHOS BINDER) 667 MG PO CAPS
2001.0000 mg | ORAL_CAPSULE | Freq: Three times a day (TID) | ORAL | Status: DC
  Administered 2020-08-07 – 2020-08-11 (×12): 2001 mg via ORAL
  Filled 2020-08-07 (×15): qty 3

## 2020-08-07 MED ORDER — SODIUM BICARBONATE 8.4 % IV SOLN: INTRAVENOUS | Status: DC

## 2020-08-07 MED ORDER — SODIUM BICARBONATE-DEXTROSE 150-5 MEQ/L-% IV SOLN
150.0000 meq | INTRAVENOUS | Status: DC
  Administered 2020-08-07 – 2020-08-10 (×5): 150 meq via INTRAVENOUS
  Filled 2020-08-07 (×10): qty 1000

## 2020-08-07 MED ORDER — METHADONE HCL 5 MG PO TABS
2.5000 mg | ORAL_TABLET | Freq: Every day | ORAL | Status: DC | PRN
  Filled 2020-08-07: qty 1

## 2020-08-07 MED ORDER — DEXAMETHASONE SODIUM PHOSPHATE 10 MG/ML IJ SOLN: 6.0000 mg | INTRAMUSCULAR | Status: DC

## 2020-08-07 MED ORDER — INSULIN ASPART 100 UNIT/ML ~~LOC~~ SOLN
SUBCUTANEOUS | Status: AC
  Administered 2020-08-07: 10 [IU] via INTRAVENOUS
  Filled 2020-08-07: qty 1

## 2020-08-07 MED ORDER — ASPIRIN EC 81 MG PO TBEC
81.0000 mg | DELAYED_RELEASE_TABLET | Freq: Every day | ORAL | Status: DC
  Administered 2020-08-07 – 2020-08-11 (×5): 81 mg via ORAL
  Filled 2020-08-07 (×5): qty 1

## 2020-08-07 MED ORDER — LOSARTAN POTASSIUM 50 MG PO TABS
50.0000 mg | ORAL_TABLET | Freq: Every day | ORAL | Status: DC
  Administered 2020-08-09 – 2020-08-11 (×2): 50 mg via ORAL
  Filled 2020-08-07 (×3): qty 1

## 2020-08-07 NOTE — ED Notes (Signed)
Updated Joy RN that pt is almost finished with dialysis.

## 2020-08-07 NOTE — ED Notes (Signed)
Pt trial to RA; O2 sat dipped to 88%; placed back on 2L via [place]

## 2020-08-07 NOTE — Consult Note (Signed)
Remdesivir - Pharmacy Brief Note   O:  ALT: 19 CXR: "Diffuse interstitial and alveolar airspace opacities bilaterally, findings could reflect pulmonary edema versus pneumonia" SpO2: Hypoxic requiring supplemental oxygen   A/P:  08/07/20 SARS-CoV-2 PCR (+)  Remdesivir 200 mg IVPB once followed by 100 mg IVPB daily x 4 days.   Anita Contreras 08/07/2020 12:16 PM

## 2020-08-07 NOTE — ED Notes (Signed)
Pt receiving dialysis at bedside at this time. Call bell within reach, stretcher locked in lowest position, NAD noted,.

## 2020-08-07 NOTE — ED Notes (Signed)
Per Anita Contreras, pt has approximately 2.5 hours of dialysis left. Remdesivir has been paused, as well as sodium bicarb.

## 2020-08-07 NOTE — ED Notes (Signed)
Per MD Chastterjee, no meds for BP at this moment. Reassess after dialysis

## 2020-08-07 NOTE — ED Notes (Signed)
Pt in NAD at this time, RR even and unlabored.  Dialysis nurse at bedside

## 2020-08-07 NOTE — Progress Notes (Signed)
[facility]  ROUNDING NOTE   Subjective:   Ms. Natania Finigan was admitted to [facility] on 08/07/2020 for Hyperkalemia [E87.5]  Last hemodialysis treatment was August 26. She missed Saturday's treatment.   Patient was experiencing, nausea, vomiting, and diarrhea.   Objective:  Vital signs in last 24 hours:  Temp:  [100.8 F (38.2 C)] 100.8 F (38.2 C) (08/30 0752) Pulse Rate:  [65-78] 65 (08/30 1230) Resp:  [18-28] 24 (08/30 1230) BP: (155-198)/(33-51) 173/51 (08/30 1230) SpO2:  [88 %-99 %] 96 % (08/30 1230) Weight:  [55.4 kg-512 kg] 55.4 kg (08/30 1324)  Weight change:  Filed Weights   08/07/20 0752 08/07/20 1230 08/07/20 1324  Weight: (!) 512 kg 55.4 kg 55.4 kg    Intake/Output: No intake/output data recorded.   Intake/Output this shift:  Total I/O In: 50 [IV Piggyback:50] Out: -   Physical Exam: General: NAD,   Head: Normocephalic, atraumatic. Moist oral mucosal membranes  Eyes: Anicteric, PERRL  Neck: Supple, trachea midline  Lungs:  Clear to auscultation  Heart: Regular rate and rhythm  Abdomen:  Soft, nontender,   Extremities:  + peripheral edema.  Neurologic: Nonfocal, moving all four extremities  Skin: No lesions  Access: Right AVF    Basic Metabolic Panel: Recent Labs  Lab 08/07/20 0841  NA 136  K 7.2*  CL 103  CO2 20*  GLUCOSE 183*  BUN 59*  CREATININE 10.07*  CALCIUM 7.9*    Liver Function Tests: Recent Labs  Lab 08/07/20 0841  AST 40  ALT 19  ALKPHOS 102  BILITOT 1.2  PROT 7.1  ALBUMIN 3.9   No results for input(s): LIPASE, AMYLASE in the last 168 hours. No results for input(s): AMMONIA in the last 168 hours.  CBC: Recent Labs  Lab 08/07/20 0841  WBC 7.3  NEUTROABS 6.4  HGB 8.9*  HCT 27.4*  MCV 98.9  PLT 175    Cardiac Enzymes: No results for input(s): CKTOTAL, CKMB, CKMBINDEX, TROPONINI in the last 168 hours.  BNP: Invalid input(s): POCBNP  CBG: No results for input(s): GLUCAP in the last  168 hours.  Microbiology: Results for orders placed or performed during the hospital encounter of 08/07/20  SARS Coronavirus 2 by RT PCR (hospital order, performed in [facility] hospital lab) Nasopharyngeal Urine, Catheterized     Status: Abnormal   Collection Time: 08/07/20  8:41 AM   Specimen: Urine, Catheterized; Nasopharyngeal  Result Value Ref Range Status   SARS Coronavirus 2 POSITIVE (A) NEGATIVE Final    Comment: RESULT CALLED TO, READ BACK BY AND VERIFIED WITH: MARY NEEDHAM AT 1019 ON 08/07/2020 [place]. (NOTE) SARS-CoV-2 target nucleic acids are DETECTED  SARS-CoV-2 RNA is generally detectable in upper respiratory specimens  during the acute phase of infection.  Positive results are indicative  of the presence of the identified virus, but do not rule out bacterial infection or co-infection with other pathogens not detected by the test.  Clinical correlation with patient history and  other diagnostic information is necessary to determine patient infection status.  The expected result is negative.  Fact Sheet for Patients:   StrictlyIdeas.no   Fact Sheet for Healthcare Providers:   BankingDealers.co.za    This test is not yet approved or cleared by the [place] FDA and  has been authorized for detection and/or diagnosis of SARS-CoV-2 by FDA under an Emergency Use Authorization (EUA).  This EUA will remain in effect (meaning thi s test can be used) for the duration of  the COVID-19  declaration under Section 564(b)(1) of the Act, 21 U.S.C. section 360-bbb-3(b)(1), unless the authorization is terminated or revoked sooner.  Performed at [facility], [address] [place], [place] [address]     Coagulation Studies: No results for input(s): LABPROT, INR in the last 72 hours.  Urinalysis: Recent Labs    08/07/20 0858  COLORURINE YELLOW*  LABSPEC 1.013  PHURINE 8.0  GLUCOSEU NEGATIVE  HGBUR NEGATIVE   BILIRUBINUR NEGATIVE  KETONESUR NEGATIVE  PROTEINUR >=300*  NITRITE NEGATIVE  LEUKOCYTESUR NEGATIVE      Imaging: DG Chest Port 1 View  Result Date: 08/07/2020 CLINICAL DATA:  Vomiting, fever, hypoxia EXAM: PORTABLE CHEST 1 VIEW COMPARISON:  01/20/2020 FINDINGS: Stable cardiomegaly. Atherosclerotic calcification of the aortic knob. Diffuse interstitial and alveolar airspace opacities bilaterally. No appreciable pleural fluid collection. No pneumothorax. Right axillary vascular stent. IMPRESSION: Diffuse interstitial and alveolar airspace opacities bilaterally, findings could reflect pulmonary edema versus pneumonia. Electronically Signed   By: Davina Poke D.O.   On: 08/07/2020 09:04     Medications:   . sodium chloride    . remdesivir 200 mg in sodium chloride 0.9% 250 mL IVPB 200 mg (08/07/20 1333)   Followed by  . [START ON 08/08/2020] remdesivir 100 mg in NS 100 mL    . sodium bicarbonate 150 mEq in dextrose 5% 1000 mL Stopped (08/07/20 1326)   . [START ON 08/08/2020] dexamethasone (DECADRON) injection  6 mg Intravenous Q24H  . [START ON 08/08/2020] epoetin (EPOGEN/PROCRIT) injection  10,000 Units Intravenous Q T,Th,Sa-HD  . heparin  5,000 Units Subcutaneous Q8H  . insulin aspart  0-6 Units Subcutaneous TID WC  . sodium chloride flush  3 mL Intravenous Q12H  . sodium zirconium cyclosilicate  10 g Oral TID   sodium chloride, acetaminophen, loperamide, ondansetron **OR** ondansetron (ZOFRAN) IV, polyethylene glycol, sodium chloride flush  Assessment/ Plan:  Ms. Heavyn Yearsley is a 64 y.o. Hispanic Spanish Speaking only female with end stage renal disease on hemodialysis, insulin-dependent diabetes, diabetic retinopathy, hypertension, hyperlipidemia, history of hemorrhagic cystitis, migraine headaches, osteoarthritis, osteoporosis who is admitted to [facility] on Hyperkalemia [E87.5]   [facility] [place] right upper arm AV fistula 51.5kg  1. End Stage Renal  Disease with hyperkalemia: emergent hemodialysis treatment in the Emergency department.  Monitor daily for dailysis.  - sodium zirconium cyclosilicate - bicarb infusion.   2. Hypertension: elevated on admission.  Home regimen of amlodipine, hydralazine, losartan - recommend holding losartan due to hyperkalemia.   3. Anemia of chronic kidney disease: hemoglobin 8.9. normocytic. Mircera as outpatient.  - EPO with HD treatment.   4. Secondary Hyperparathyroidism:  - calcium acetate with meals.    LOS: 0 Greysyn Vanderberg 8/30/20211:49 PM

## 2020-08-07 NOTE — ED Notes (Signed)
Pt's SBP noted to be greater than 180 continuously, Dr Jamse Arn messaged via secure chat to request prn BP med. Awaiting response

## 2020-08-07 NOTE — ED Provider Notes (Signed)
[facility] Emergency Department Provider Note ____________________________________________   First MD Initiated Contact with Patient 08/07/20 0815     (approximate)  I have reviewed the triage vital signs and the nursing notes.  HISTORY  Chief Complaint No chief complaint on file.   HPI Anita Contreras is a 64 y.o. femalewho presents to the ED for evaluation of vomiting and difficulty walking.  Chart review indicates history of ESRD on hemodialysis, T,Th,S schedule.  HTN, HLD and DM on insulin. Patient reports that she lives in [place] and does not know her nephrologist or where she gets dialysis.  Reports her last dialysis session was Thursday and that she missed Saturday without a reason.  She still makes urine.  Patient reports feeling poorly since Friday with subjective fevers and chills, diarrhea that is watery, diffuse myalgias and particularly to her left lower back.  Patient denies trauma or injury.  Patient reports difficulty walking due to her myalgias and feeling poorly today, so she presents to the ED for evaluation.  Patient is not vaccinated for COVID-19, and denies known contacts.  She reports subjective shortness of breath and nonproductive cough.  Denies wearing home oxygen.  In-person interpreter used for my evaluation.  Past Medical History:  Diagnosis Date  . Acute cystitis with hematuria   . Anemia   . Cataracts, bilateral   . CKD (chronic kidney disease)   . Diabetic retinopathy, background ([place])   . Dialysis patient ([place])    Tues, Thurs, Sat  . Dizziness   . Hyperlipidemia   . Hypertension   . Migraine variant   . Neuropathy   . OP (osteoporosis)   . Personal history of noncompliance with medical treatment, presenting hazards to health   . Steal syndrome as complication of dialysis access ([place])   . Stroke ([place])   . Type II diabetes mellitus with ophthalmic manifestations, uncontrolled ([place])   . Vitamin D deficiency      Patient Active Problem List   Diagnosis Date Noted  . Pneumonia due to COVID-19 virus 08/07/2020  . Type II diabetes mellitus with ophthalmic manifestations, uncontrolled ([place])   . Abnormality of albumin 01/20/2020  . Mild protein-calorie malnutrition ([place]) 02/28/2019  . Dependence on renal dialysis ([place]) 02/05/2018  . HTN (hypertension) 01/26/2018  . Headache, unspecified 01/22/2018  . HCAP (healthcare-associated pneumonia) 12/23/2017  . Fever, unspecified 12/23/2017  . Bradycardia 01/17/2017  . Syncope 01/17/2017  . Nausea and vomiting 01/17/2017  . Essential hypertension, malignant 01/17/2017  . Lactic acidosis 01/17/2017  . Leukocytosis 01/17/2017  . Orthostatic hypotension 01/17/2017  . Hyperkalemia 01/14/2017  . Other chronic pain 12/14/2016  . Steal syndrome as complication of dialysis access ([place]) 11/08/2016  . Pain in left arm 10/23/2016  . ESRD on dialysis ([place]) 10/15/2016  . Renal dialysis device, implant, or graft complication 63/33/5456  . Hyperlipidemia 10/15/2016  . Essential hypertension 10/15/2016  . Controlled diabetes mellitus type 2 with complications ([place]) 25/63/8937  . Disorder of phosphorus metabolism, unspecified 07/01/2016  . Encounter for immunization 06/05/2016  . Other disorders of plasma-protein metabolism, not elsewhere classified 06/01/2016  . Anemia in chronic kidney disease 05/12/2016  . Iron deficiency anemia, unspecified 05/12/2016  . Coagulation defect, unspecified ([place]) 05/06/2016  . Heart failure, unspecified ([place]) 05/06/2016  . Shortness of breath 05/06/2016  . Acute on chronic renal failure ([place]) 04/23/2016  . 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 Acute right flank pain   . ARF (acute renal failure) ([place]) 04/15/2016  .  Acute cystitis 05/26/2015  . Neuropathy 05/26/2015    Past Surgical History:  Procedure Laterality Date  . A/V FISTULAGRAM Right 06/03/2018   Procedure: A/V FISTULAGRAM;   Surgeon: Algernon Huxley, MD;  Location: [place] [facility];  Service: Cardiovascular;  Laterality: Right;  . A/V FISTULAGRAM Right 02/22/2019   Procedure: A/V FISTULAGRAM;  Surgeon: Algernon Huxley, MD;  Location: [place] [facility];  Service: Cardiovascular;  Laterality: Right;  . A/V SHUNT INTERVENTION N/A 01/26/2018   Procedure: A/V SHUNT INTERVENTION;  Surgeon: Algernon Huxley, MD;  Location: [place] [facility];  Service: Cardiovascular;  Laterality: N/A;  . A/V SHUNTOGRAM Right 01/26/2018   Procedure: A/V SHUNTOGRAM;  Surgeon: Algernon Huxley, MD;  Location: [place] [facility];  Service: Cardiovascular;  Laterality: Right;  . AV FISTULA PLACEMENT Right 10/10/2016   Procedure: ARTERIOVENOUS (AV) FISTULA CREATION ( BRACHIOCEPHALIC );  Surgeon: Algernon Huxley, MD;  Location: [facility];  Service: Vascular;  Laterality: Right;  . DILATION AND CURETTAGE OF UTERUS    . DISTAL REVASCULARIZATION AND INTERVAL LIGATION (DRIL) Right 11/08/2016   Procedure: DISTAL REVASCULARIZATION AND INTERVAL LIGATION (DRIL);  Surgeon: Algernon Huxley, MD;  Location: [facility];  Service: Vascular;  Laterality: Right;  . PERIPHERAL VASCULAR CATHETERIZATION N/A 04/25/2016   Procedure: Dialysis/Perma Catheter Insertion;  Surgeon: Algernon Huxley, MD;  Location: [place] [facility];  Service: Cardiovascular;  Laterality: N/A;  . PERIPHERAL VASCULAR CATHETERIZATION Right 10/17/2016   Procedure: Upper Extremity Angiography;  Surgeon: Algernon Huxley, MD;  Location: [place] [facility];  Service: Cardiovascular;  Laterality: Right;  . UPPER EXTREMITY ANGIOGRAPHY Right 10/12/2018   Procedure: UPPER EXTREMITY ANGIOGRAPHY;  Surgeon: Algernon Huxley, MD;  Location: [place] [facility];  Service: Cardiovascular;  Laterality: Right;    Prior to Admission medications   Medication Sig Start Date End Date Taking? Authorizing Provider  acetaminophen (TYLENOL) 500 MG tablet Take 1,000 mg by mouth every 6 (six) hours as needed for mild pain or headache.     [provider]  amLODipine (NORVASC) 10 MG tablet Take 1 tablet (10 mg total) by mouth daily. 01/27/18   Bettey Costa, MD  aspirin 81 MG EC tablet TAKE 1 TABLET BY MOUTH EVERY DAY Patient not taking: Reported on 10/12/2018 04/15/17   Stegmayer, Janalyn Harder, PA-C  atorvastatin (LIPITOR) 40 MG tablet Take 40 mg by mouth daily at 6 PM.  Patient not taking: Reported on 05/22/2020    [provider]  calcium acetate (PHOSLO) 667 MG capsule Take 1 capsule (667 mg total) by mouth 3 (three) times daily with meals. 04/19/16   Fritzi Mandes, MD  docusate sodium (COLACE) 100 MG capsule Take 100 mg by mouth 2 (two) times daily. Patient not taking: Reported on 05/22/2020    [provider]  furosemide (LASIX) 20 MG tablet Take 20 mg by mouth daily. Patient not taking: Reported on 05/22/2020    [provider]  gabapentin (NEURONTIN) 300 MG capsule Take by mouth. Patient not taking: Reported on 05/22/2020    [provider]  hydrALAZINE (APRESOLINE) 50 MG tablet Take 1 tablet (50 mg total) by mouth 3 (three) times daily. 01/27/18   Bettey Costa, MD  hydrOXYzine (ATARAX/VISTARIL) 25 MG tablet Take 25 mg by mouth 2 (two) times daily as needed for itching.    [provider]  insulin NPH-regular Human (NOVOLIN 70/30) (70-30) 100 UNIT/ML injection Inject 6 Units into the skin 2 (two) times daily with a meal.  Patient not taking: Reported on 05/22/2020    [provider]  insulin regular (HUMULIN R) 100 units/mL injection Inject into the skin. Patient not taking: Reported on 05/22/2020    [provider]  lidocaine-prilocaine (EMLA) cream Apply 1 application topically as needed.    [provider]  lisinopril (PRINIVIL,ZESTRIL) 40 MG tablet Take by mouth. Patient not taking: Reported on 05/22/2020    [provider]  losartan (COZAAR) 50 MG tablet Take 1 tablet (50 mg total) by mouth daily. Patient not taking: Reported on 02/15/2019 01/27/18    Bettey Costa, MD  Nutritional Supplements (FEEDING SUPPLEMENT, NEPRO CARB STEADY,) LIQD Take 237 mLs by mouth 2 (two) times daily between meals. Patient not taking: Reported on 05/22/2020 05/07/16   Epifanio Lesches, MD  sodium bicarbonate 650 MG tablet Take 1,300 mg by mouth 2 (two) times daily. Patient not taking: Reported on 05/22/2020    [provider]    Allergies Dextromethorphan-guaifenesin and Propofol  Family History  Problem Relation Age of Onset  . Hypertension Mother   . Hyperlipidemia Mother     Social History Social History   Tobacco Use  . Smoking status: Never Smoker  . Smokeless tobacco: Never Used  Substance Use Topics  . Alcohol use: No  . Drug use: No    Review of Systems  Constitutional: Positive for fever/chills Eyes: No visual changes. ENT: No sore throat. Cardiovascular: Denies chest pain. Respiratory: Positive for shortness of breath and nonproductive cough. Gastrointestinal: No abdominal pain.  No diarrhea.  No constipation.  Positive for nausea and vomiting. Genitourinary: Negative for dysuria. Musculoskeletal: Positive for left lower back pain. Skin: Negative for rash. Neurological: Negative for headaches, focal weakness or numbness.   ____________________________________________   PHYSICAL EXAM:  VITAL SIGNS: Vitals:   08/07/20 1000 08/07/20 1030  BP: (!) 172/48 (!) 155/42  Pulse: 66 69  Resp: (!) 24 (!) 25  Temp:    SpO2: 98% 99%      Constitutional: Alert and oriented.  Warm to the touch and uncomfortable-appearing..  Conversational in full sentences via the in-person interpreter. Eyes: Conjunctivae are normal. PERRL. EOMI. Head: Atraumatic. Nose: No congestion/rhinnorhea. Mouth/Throat: Mucous membranes are dry.  Oropharynx non-erythematous. Neck: No stridor. No cervical spine tenderness to palpation. Cardiovascular: Normal rate, regular rhythm. Grossly normal heart sounds.  Good peripheral  circulation. Respiratory: No retractions. Lungs CTAB.  Tachypneic to the mid 20s on nasal cannula.  Air movement throughout. Gastrointestinal: Soft , nondistended, nontender to palpation. No abdominal bruits.  Left-sided CVA tenderness is present. Musculoskeletal: No lower extremity tenderness nor edema.  No joint effusions. No signs of acute trauma. AV fistula to RUE with palpable thrill.  Distally neurovascularly intact. No signs of trauma to the back of the extremities. No spinal tenderness or bony step-offs throughout.  Left CVA tenderness is present. Neurologic:  Normal speech and language. No gross focal neurologic deficits are appreciated. No gait instability noted. Skin:  Skin is warm, dry and intact. No rash noted. Psychiatric: Mood and affect are normal. Speech and behavior are normal.  ____________________________________________   LABS (all labs ordered are listed, but only abnormal results are displayed)  Labs Reviewed  SARS CORONAVIRUS 2 BY RT PCR (HOSPITAL ORDER, [facility]) - Abnormal; Notable for the following components:      Result Value   SARS Coronavirus 2 POSITIVE (*)    All other components within normal limits  CBC WITH DIFFERENTIAL/PLATELET - Abnormal; Notable for the following components:  RBC 2.77 (*)    Hemoglobin 8.9 (*)    HCT 27.4 (*)    Lymphs Abs 0.6 (*)    All other components within normal limits  COMPREHENSIVE METABOLIC PANEL - Abnormal; Notable for the following components:   Potassium 7.2 (*)    CO2 20 (*)    Glucose, Bld 183 (*)    BUN 59 (*)    Creatinine, Ser 10.07 (*)    Calcium 7.9 (*)    GFR calc non Af Amer 4 (*)    GFR calc Af Amer 4 (*)    All other components within normal limits  URINALYSIS, COMPLETE (UACMP) WITH MICROSCOPIC - Abnormal; Notable for the following components:   Color, Urine YELLOW (*)    APPearance CLEAR (*)    Protein, ur >=300 (*)    All other components within normal limits   TROPONIN I (HIGH SENSITIVITY) - Abnormal; Notable for the following components:   Troponin I (High Sensitivity) 19 (*)    All other components within normal limits  LACTIC ACID, PLASMA  PROCALCITONIN  FERRITIN  C-REACTIVE PROTEIN  FIBRIN DERIVATIVES D-DIMER ([facility] ONLY)  TROPONIN I (HIGH SENSITIVITY)   ____________________________________________  12 Lead EKG  Sinus rhythm, rate of 80 bpm, normal axis and intervals.  Peaked T waves throughout.  No evidence of acute ischemia. ____________________________________________  RADIOLOGY  ED MD interpretation:  1 view CXR with mild and diffuse interstitial opacities  Official radiology report(s): DG Chest Port 1 View  Result Date: 08/07/2020 CLINICAL DATA:  Vomiting, fever, hypoxia EXAM: PORTABLE CHEST 1 VIEW COMPARISON:  01/20/2020 FINDINGS: Stable cardiomegaly. Atherosclerotic calcification of the aortic knob. Diffuse interstitial and alveolar airspace opacities bilaterally. No appreciable pleural fluid collection. No pneumothorax. Right axillary vascular stent. IMPRESSION: Diffuse interstitial and alveolar airspace opacities bilaterally, findings could reflect pulmonary edema versus pneumonia. Electronically Signed   By: Davina Poke D.O.   On: 08/07/2020 09:04    ____________________________________________   PROCEDURES and INTERVENTIONS  Procedure(s) performed (including Critical Care):  .Critical Care Performed by: Vladimir Crofts, MD Authorized by: Vladimir Crofts, MD   Critical care provider statement:    Critical care time (minutes):  35   Critical care was necessary to treat or prevent imminent or life-threatening deterioration of the following conditions:  Circulatory failure, metabolic crisis and respiratory failure   Critical care was time spent personally by me on the following activities:  Discussions with consultants, evaluation of patient's response to treatment, examination of patient, ordering and performing  treatments and interventions, ordering and review of laboratory studies, ordering and review of radiographic studies, pulse oximetry, re-evaluation of patient's condition, obtaining history from patient or surrogate and review of old charts    Medications  calcium gluconate 1 g/ 50 mL sodium chloride IVPB (1,000 mg Intravenous New Bag/Given 08/07/20 1029)  sodium zirconium cyclosilicate (LOKELMA) packet 10 g (10 g Oral Given 08/07/20 1040)  sodium bicarbonate 150 mEq in dextrose 5% 1000 mL infusion (has no administration in time range)  dexamethasone (DECADRON) injection 6 mg (has no administration in time range)  acetaminophen (TYLENOL) tablet 1,000 mg (1,000 mg Oral Given 08/07/20 0848)  furosemide (LASIX) injection 40 mg (40 mg Intravenous Given 08/07/20 1023)  insulin aspart (novoLOG) injection 10 Units (10 Units Intravenous Given 08/07/20 1016)    And  dextrose 50 % solution 50 mL (50 mLs Intravenous Given 08/07/20 1020)    ____________________________________________   MDM / ED COURSE  64 year old unvaccinated ESRD patient presents with diarrhea and difficulty walking,  found to have severe hyperkalemia requiring urgent dialysis, and COVID-19 with hypoxia requiring Decadron and medical admission.  Patient with low-grade fever on presentation and hypoxic to the upper 80s requiring a new nasal cannula, otherwise she is hemodynamically stable.  Exam demonstrates an uncomfortable-appearing patient who is in no acute distress.  She has a benign abdomen, though does report left CVA tenderness.  Patient still produces urine, and urinalysis without evidence of pyelonephritis as the source of her pain.  Blood work does demonstrate severe hyperkalemia, and her EKG has peaked T waves.  Discussed the case with nephrology on-call who advises urgent dialysis, which I agree with. Unfortunately due to staffing issues, we only have one RN available for dialysis today, and they plan to dialyze non-Covid patients  in the hospital first before moving on to Covid patients.  Dr. Juleen China requests all temporizing measures for hyperkalemia to be administered at this time to hopefully bridge the patient until she can be dialyzed. Patient clinically has COVID-19, and her swab returns positive for this.  Due to her coexisting hypoxia, initiate a course of Decadron to the patient.  She is stable on nasal cannula and does not require further respiratory support at this time. Will admit the patient to hospitalist medicine for further work-up management.  Clinical Course as of Aug 08 1111  Mon Aug 07, 2020  0923 Blood returning with hyperkalemia, consistent with peaked T waves seen on EKG.  Asked secretary to get nephrology for me, calcium ordered.   [DS]  [id number] Spoke with Dr. Juleen China, nephrologist on-call.  We discussed patient's presentation consistent with hyperkalemia due to missed dialysis, and her need for urgent dialysis at this time.  We discussed her clinical positivity for COVID-19, swab in process.   [DS]  37 Urged RN to provide hyperkalemic treatment medications.  She reports that this is her next task.  I advised her of the importance of these medications in the setting of hyperkalemia   [DS]  62 Spoke with admitting hospitalist who requests I order CRP, D Dimer, Ferritin and Procal. She agrees to admit the patient    [DS]    Clinical Course User Index [DS] Vladimir Crofts, MD     ____________________________________________   FINAL CLINICAL IMPRESSION(S) / ED DIAGNOSES  Final diagnoses:  Hyperkalemia  ESRD (end stage renal disease) ([place])  COVID-19  Fever in other diseases  Lymphopenia  Hypoxia     ED Discharge Orders    None       Noella Kipnis   Note:  This document was prepared using Dragon voice recognition software and may include unintentional dictation errors.   Vladimir Crofts, MD 08/07/20 [phone]

## 2020-08-07 NOTE — ED Notes (Signed)
ED TO INPATIENT HANDOFF REPORT  ED Nurse Name and Phone #: Stanton Kidney [phone]  S Name/Age/Gender Anita Contreras 64 y.o. female Room/Bed: ED17A/ED17A  Code Status   Code Status: Full Code  Home/SNF/Other Home Patient oriented to: self, place, time and situation Is this baseline? Yes   Triage Complete: Triage complete  Chief Complaint Hyperkalemia [E87.5]  Triage Note Presents with vomiting and diarrhea since Friday    Febrile on arrival    Allergies Allergies  Allergen Reactions  . Dextromethorphan-Guaifenesin     Other reaction(s): Itching of skin (finding)  . Propofol Itching    Pt says " when getting anesthesia by body itches" unsure what type of anesthesia    Level of Care/Admitting Diagnosis ED Disposition    ED Disposition Condition [place]: [place] [[id number]]  Level of Care: Med-Surg [16]  Covid Evaluation: Confirmed COVID Positive  Diagnosis: Hyperkalemia [481856]  Admitting Physician: Vashti Hey [[id number]]  Attending Physician: Vashti Hey [[id number]]  Estimated length of stay: past midnight tomorrow  Certification:: I certify this patient will need inpatient services for at least 2 midnights  Bed request comments: Can go to med surg bed AFTER HD, if she needs to go before HD, will need a progressive bed temporarily.       B Medical/Surgery History Past Medical History:  Diagnosis Date  . Acute cystitis with hematuria   . Anemia   . Cataracts, bilateral   . CKD (chronic kidney disease)   . Diabetic retinopathy, background ([place])   . Dialysis patient ([place])    Tues, Thurs, Sat  . Dizziness   . Hyperlipidemia   . Hypertension   . Migraine variant   . Neuropathy   . OP (osteoporosis)   . Personal history of noncompliance with medical treatment, presenting hazards to health   . Steal syndrome as complication of dialysis access ([place])   . Stroke ([place])   . Type II  diabetes mellitus with ophthalmic manifestations, uncontrolled ([place])   . Vitamin D deficiency    Past Surgical History:  Procedure Laterality Date  . A/V FISTULAGRAM Right 06/03/2018   Procedure: A/V FISTULAGRAM;  Surgeon: Algernon Huxley, MD;  Location: [place] [facility];  Service: Cardiovascular;  Laterality: Right;  . A/V FISTULAGRAM Right 02/22/2019   Procedure: A/V FISTULAGRAM;  Surgeon: Algernon Huxley, MD;  Location: [place] [facility];  Service: Cardiovascular;  Laterality: Right;  . A/V SHUNT INTERVENTION N/A 01/26/2018   Procedure: A/V SHUNT INTERVENTION;  Surgeon: Algernon Huxley, MD;  Location: [place] [facility];  Service: Cardiovascular;  Laterality: N/A;  . A/V SHUNTOGRAM Right 01/26/2018   Procedure: A/V SHUNTOGRAM;  Surgeon: Algernon Huxley, MD;  Location: [place] [facility];  Service: Cardiovascular;  Laterality: Right;  . AV FISTULA PLACEMENT Right 10/10/2016   Procedure: ARTERIOVENOUS (AV) FISTULA CREATION ( BRACHIOCEPHALIC );  Surgeon: Algernon Huxley, MD;  Location: [facility];  Service: Vascular;  Laterality: Right;  . DILATION AND CURETTAGE OF UTERUS    . DISTAL REVASCULARIZATION AND INTERVAL LIGATION (DRIL) Right 11/08/2016   Procedure: DISTAL REVASCULARIZATION AND INTERVAL LIGATION (DRIL);  Surgeon: Algernon Huxley, MD;  Location: [facility];  Service: Vascular;  Laterality: Right;  . PERIPHERAL VASCULAR CATHETERIZATION N/A 04/25/2016   Procedure: Dialysis/Perma Catheter Insertion;  Surgeon: Algernon Huxley, MD;  Location: [place] [facility];  Service: Cardiovascular;  Laterality: N/A;  . PERIPHERAL VASCULAR CATHETERIZATION Right 10/17/2016   Procedure: Upper Extremity  Angiography;  Surgeon: Algernon Huxley, MD;  Location: [facility];  Service: Cardiovascular;  Laterality: Right;  . UPPER EXTREMITY ANGIOGRAPHY Right 10/12/2018   Procedure: UPPER EXTREMITY ANGIOGRAPHY;  Surgeon: Algernon Huxley, MD;  Location: [facility] [facility];  Service: Cardiovascular;  Laterality: Right;     A IV  Location/Drains/Wounds Patient Lines/Drains/Airways Status    Active Line/Drains/Airways    Name Placement date Placement time Site Days   Peripheral IV 08/07/20 Left Wrist 08/07/20  0829  Wrist  less than 1   Fistula / Graft Right Upper arm Arteriovenous fistula 11/13/16  --  Upper arm  1363   Fistula / Graft Right Upper arm 12/25/17  0745  Upper arm  956   Hemodialysis Catheter Right Subclavian Double-lumen 04/25/16  --  Subclavian  1565   Sheath 02/22/19 Right Venous;Brachial 02/22/19  0951  Venous;Brachial  532   Incision (Closed) 01/14/17 Thigh Left;Medial;Upper 01/14/17  1200   1301          Intake/Output Last 24 hours  Intake/Output Summary (Last 24 hours) at 08/07/2020 1411 Last data filed at 08/07/2020 1157 Gross per 24 hour  Intake 50 ml  Output --  Net 50 ml    Labs/Imaging Results for orders placed or performed during the hospital encounter of 08/07/20 (from the past 48 hour(s))  CBC with Differential     Status: Abnormal   Collection Time: 08/07/20  8:41 AM  Result Value Ref Range   WBC 7.3 4.0 - 10.5 K/uL   RBC 2.77 (L) 3.87 - 5.11 MIL/uL   Hemoglobin 8.9 (L) 12.0 - 15.0 g/dL   HCT 27.4 (L) 36 - 46 %   MCV 98.9 80.0 - 100.0 fL   MCH 32.1 26.0 - 34.0 pg   MCHC 32.5 30.0 - 36.0 g/dL   RDW 14.8 11.5 - 15.5 %   Platelets 175 150 - 400 K/uL   nRBC 0.0 0.0 - 0.2 %   Neutrophils Relative % 87 %   Neutro Abs 6.4 1.7 - 7.7 K/uL   Lymphocytes Relative 8 %   Lymphs Abs 0.6 (L) 0.7 - 4.0 K/uL   Monocytes Relative 4 %   Monocytes Absolute 0.3 0 - 1 K/uL   Eosinophils Relative 0 %   Eosinophils Absolute 0.0 0 - 0 K/uL   Basophils Relative 0 %   Basophils Absolute 0.0 0 - 0 K/uL   Immature Granulocytes 1 %   Abs Immature Granulocytes 0.07 0.00 - 0.07 K/uL    Comment: Performed at [facility], [address] [place], [place] [address]  Comprehensive metabolic panel     Status: Abnormal   Collection Time: 08/07/20  8:41 AM  Result Value Ref Range    Sodium 136 135 - 145 mmol/L   Potassium 7.2 (HH) 3.5 - 5.1 mmol/L    Comment: HEMOLYSIS AT THIS LEVEL MAY AFFECT RESULT CRITICAL RESULT CALLED TO, READ BACK BY AND VERIFIED WITH Analeia Ismael AT 5885 08/07/20 DAS    Chloride 103 98 - 111 mmol/L   CO2 20 (L) 22 - 32 mmol/L   Glucose, Bld 183 (H) 70 - 99 mg/dL    Comment: Glucose reference range applies only to samples taken after fasting for at least 8 hours.   BUN 59 (H) 8 - 23 mg/dL   Creatinine, Ser 10.07 (H) 0.44 - 1.00 mg/dL   Calcium 7.9 (L) 8.9 - 10.3 mg/dL   Total Protein 7.1 6.5 - 8.1 g/dL   Albumin  3.9 3.5 - 5.0 g/dL   AST 40 15 - 41 U/L    Comment: HEMOLYSIS AT THIS LEVEL MAY AFFECT RESULT   ALT 19 0 - 44 U/L    Comment: HEMOLYSIS AT THIS LEVEL MAY AFFECT RESULT   Alkaline Phosphatase 102 38 - 126 U/L   Total Bilirubin 1.2 0.3 - 1.2 mg/dL    Comment: HEMOLYSIS AT THIS LEVEL MAY AFFECT RESULT   GFR calc non Af Amer 4 (L) >60 mL/min   GFR calc Af Amer 4 (L) >60 mL/min   Anion gap 13 5 - 15    Comment: Performed at [facility], [place], [place] [address]  Troponin I (High Sensitivity)     Status: Abnormal   Collection Time: 08/07/20  8:41 AM  Result Value Ref Range   Troponin I (High Sensitivity) 19 (H) <18 ng/L    Comment: (NOTE) Elevated high sensitivity troponin I (hsTnI) values and significant  changes across serial measurements may suggest ACS but many other  chronic and acute conditions are known to elevate hsTnI results.  Refer to the "Links" section for chest pain algorithms and additional  guidance. Performed at [facility], [address] [place], [place] [address]   SARS Coronavirus 2 by RT PCR (hospital order, performed in [facility] hospital lab) Nasopharyngeal Urine, Catheterized     Status: Abnormal   Collection Time: 08/07/20  8:41 AM   Specimen: Urine, Catheterized; Nasopharyngeal  Result Value Ref Range   SARS Coronavirus 2 POSITIVE (A) NEGATIVE    Comment:  RESULT CALLED TO, READ BACK BY AND VERIFIED WITH: Herminio Kniskern AT 1019 ON 08/07/2020 [place]. (NOTE) SARS-CoV-2 target nucleic acids are DETECTED  SARS-CoV-2 RNA is generally detectable in upper respiratory specimens  during the acute phase of infection.  Positive results are indicative  of the presence of the identified virus, but do not rule out bacterial infection or co-infection with other pathogens not detected by the test.  Clinical correlation with patient history and  other diagnostic information is necessary to determine patient infection status.  The expected result is negative.  Fact Sheet for Patients:   StrictlyIdeas.no   Fact Sheet for Healthcare Providers:   BankingDealers.co.za    This test is not yet approved or cleared by the [place] FDA and  has been authorized for detection and/or diagnosis of SARS-CoV-2 by FDA under an Emergency Use Authorization (EUA).  This EUA will remain in effect (meaning thi s test can be used) for the duration of  the COVID-19 declaration under Section 564(b)(1) of the Act, 21 U.S.C. section 360-bbb-3(b)(1), unless the authorization is terminated or revoked sooner.  Performed at [facility], [address] [place], [place] [address]   Lactic acid, plasma     Status: None   Collection Time: 08/07/20  8:41 AM  Result Value Ref Range   Lactic Acid, Venous 1.4 0.5 - 1.9 mmol/L    Comment: Performed at [facility], [address] [place], [place] [address]  Urinalysis, Complete w Microscopic Urine, Catheterized     Status: Abnormal   Collection Time: 08/07/20  8:58 AM  Result Value Ref Range   Color, Urine YELLOW (A) YELLOW   APPearance CLEAR (A) CLEAR   Specific Gravity, Urine 1.013 1.005 - 1.030   pH 8.0 5.0 - 8.0   Glucose, UA NEGATIVE NEGATIVE mg/dL   Hgb urine dipstick NEGATIVE NEGATIVE   Bilirubin Urine NEGATIVE NEGATIVE   Ketones, ur NEGATIVE  NEGATIVE  mg/dL   Protein, ur >=300 (A) NEGATIVE mg/dL   Nitrite NEGATIVE NEGATIVE   Leukocytes,Ua NEGATIVE NEGATIVE   RBC / HPF 0-5 0 - 5 RBC/hpf   WBC, UA 11-20 0 - 5 WBC/hpf   Bacteria, UA NONE SEEN NONE SEEN   Squamous Epithelial / LPF 0-5 0 - 5   Mucus PRESENT     Comment: Performed at [facility], [place], [place] [address]  Troponin I (High Sensitivity)     Status: Abnormal   Collection Time: 08/07/20 11:59 AM  Result Value Ref Range   Troponin I (High Sensitivity) 23 (H) <18 ng/L    Comment: (NOTE) Elevated high sensitivity troponin I (hsTnI) values and significant  changes across serial measurements may suggest ACS but many other  chronic and acute conditions are known to elevate hsTnI results.  Refer to the "Links" section for chest pain algorithms and additional  guidance. Performed at [facility], [place], [place] [address]   Procalcitonin - Baseline     Status: None   Collection Time: 08/07/20 11:59 AM  Result Value Ref Range   Procalcitonin 0.62 ng/mL    Comment:        Interpretation: PCT > 0.5 ng/mL and <= 2 ng/mL: Systemic infection (sepsis) is possible, but other conditions are known to elevate PCT as well. (NOTE)       Sepsis PCT Algorithm           Lower Respiratory Tract                                      Infection PCT Algorithm    ----------------------------     ----------------------------         PCT < 0.25 ng/mL                PCT < 0.10 ng/mL          Strongly encourage             Strongly discourage   discontinuation of antibiotics    initiation of antibiotics    ----------------------------     -----------------------------       PCT 0.25 - 0.50 ng/mL            PCT 0.10 - 0.25 ng/mL               OR       >80% decrease in PCT            Discourage initiation of                                            antibiotics      Encourage discontinuation           of antibiotics     ----------------------------     -----------------------------         PCT >= 0.50 ng/mL              PCT 0.26 - 0.50 ng/mL                AND       <80% decrease in PCT             Encourage initiation of  antibiotics       Encourage continuation           of antibiotics    ----------------------------     -----------------------------        PCT >= 0.50 ng/mL                  PCT > 0.50 ng/mL               AND         increase in PCT                  Strongly encourage                                      initiation of antibiotics    Strongly encourage escalation           of antibiotics                                     -----------------------------                                           PCT <= 0.25 ng/mL                                                 OR                                        > 80% decrease in PCT                                      Discontinue / Do not initiate                                             antibiotics  Performed at [facility], [address] [place], [place] [address]   Ferritin (Iron Binding Protein)     Status: Abnormal   Collection Time: 08/07/20 11:59 AM  Result Value Ref Range   Ferritin 1,477 (H) 11 - 307 ng/mL    Comment: Performed at [facility], [address], [place], [place] [address]  Fibrin derivatives [facility] only)     Status: Abnormal   Collection Time: 08/07/20 11:59 AM  Result Value Ref Range   Fibrin derivatives D-dimer ([facility]) 1,240.78 (H) 0.00 - 499.00 ng/mL (FEU)    Comment: (NOTE) <> Exclusion of Venous Thromboembolism (VTE) - OUTPATIENT ONLY   (Emergency Department or [place])    0-499 ng/ml (FEU): With a low to intermediate pretest probability                      for VTE this test result excludes the diagnosis  of VTE.   >499 ng/ml (FEU) : VTE not excluded; additional work up for VTE is                       required.  <> Testing on Inpatients and Evaluation of Disseminated Intravascular   Coagulation (DIC) Reference Range:   0-499 ng/ml (FEU) Performed at [facility], [address] [place], [place] [address]    DG Chest Port 1 View  Result Date: 08/07/2020 CLINICAL DATA:  Vomiting, fever, hypoxia EXAM: PORTABLE CHEST 1 VIEW COMPARISON:  01/20/2020 FINDINGS: Stable cardiomegaly. Atherosclerotic calcification of the aortic knob. Diffuse interstitial and alveolar airspace opacities bilaterally. No appreciable pleural fluid collection. No pneumothorax. Right axillary vascular stent. IMPRESSION: Diffuse interstitial and alveolar airspace opacities bilaterally, findings could reflect pulmonary edema versus pneumonia. Electronically Signed   By: Davina Poke D.O.   On: 08/07/2020 09:04    Pending Labs Unresulted Labs (From admission, onward)          Start     Ordered   08/08/20 0500  CBC with Differential/Platelet  Daily,   STAT      08/07/20 1210   08/08/20 0500  Comprehensive metabolic panel  Daily,   STAT      08/07/20 1210   08/08/20 0500  C-reactive protein  Daily,   STAT      08/07/20 1210   08/08/20 0500  Fibrin derivatives D-Dimer ([place] only)  Daily,   STAT      08/07/20 1210   08/07/20 1216  Hemoglobin A1c  Once,   STAT       Comments: To assess prior glycemic control    08/07/20 1215   08/07/20 1205  HIV Antibody (routine testing w rflx)  (HIV Antibody (Routine testing w reflex) panel)  Once,   STAT        08/07/20 1210   08/07/20 1205  ABO/Rh  Once,   STAT        08/07/20 1210   08/07/20 1205  CBC  (heparin)  Once,   STAT       Comments: Baseline for heparin therapy IF NOT ALREADY DRAWN.  Notify MD if PLT < 100 K.    08/07/20 1210   08/07/20 1205  Creatinine, serum  (heparin)  Once,   STAT       Comments: Baseline for heparin therapy IF NOT ALREADY DRAWN.    08/07/20 1210   08/07/20 1100  C-reactive protein  Once,   STAT        08/07/20 1059           Vitals/Pain Today's Vitals   08/07/20 1030 08/07/20 1230 08/07/20 1324 08/07/20 1405  BP: (!) 155/42 (!) 173/51  (!) 185/58  Pulse: 69 65    Resp: (!) 25 (!) 24    Temp:    99.3 F (37.4 C)  TempSrc:      SpO2: 99% 96%    Weight:  55.4 kg 55.4 kg   Height:      PainSc:        Isolation Precautions Airborne and Contact precautions  Medications Medications  sodium zirconium cyclosilicate (LOKELMA) packet 10 g (10 g Oral Given 08/07/20 1040)  sodium bicarbonate 150 mEq in dextrose 5% 1000 mL infusion (0 mEq Intravenous Paused 08/07/20 1326)  epoetin alfa (EPOGEN) injection 10,000 Units (has no administration in time range)  heparin injection 5,000 Units (has no administration in time range)  sodium chloride flush (NS) 0.9 %  injection 3 mL (3 mLs Intravenous Not Given 08/07/20 1301)  sodium chloride flush (NS) 0.9 % injection 3 mL (has no administration in time range)  0.9 %  sodium chloride infusion (has no administration in time range)  remdesivir 200 mg in sodium chloride 0.9% 250 mL IVPB (200 mg Intravenous New Bag/Given 08/07/20 1333)    Followed by  remdesivir 100 mg in sodium chloride 0.9 % 100 mL IVPB (has no administration in time range)  acetaminophen (TYLENOL) tablet 650 mg (has no administration in time range)  polyethylene glycol (MIRALAX / GLYCOLAX) packet 17 g (has no administration in time range)  ondansetron (ZOFRAN) tablet 4 mg (has no administration in time range)    Or  ondansetron (ZOFRAN) injection 4 mg (has no administration in time range)  loperamide (IMODIUM) capsule 2 mg (has no administration in time range)  insulin aspart (novoLOG) injection 0-6 Units (has no administration in time range)  dexamethasone (DECADRON) injection 6 mg (6 mg Intravenous Given 08/07/20 1327)  calcium acetate (PHOSLO) capsule 2,001 mg (has no administration in time range)  acetaminophen (TYLENOL) tablet 1,000 mg (1,000 mg Oral Given 08/07/20 0848)  calcium gluconate 1 g/ 50 mL  sodium chloride IVPB (0 g Intravenous Stopped 08/07/20 1157)  furosemide (LASIX) injection 40 mg (40 mg Intravenous Given 08/07/20 1023)  insulin aspart (novoLOG) injection 10 Units (10 Units Intravenous Given 08/07/20 1016)    And  dextrose 50 % solution 50 mL (50 mLs Intravenous Given 08/07/20 1020)  dexamethasone (DECADRON) injection 6 mg (6 mg Intravenous Given 08/07/20 1329)    Mobility walks Low fall risk   Focused Assessments Pulmonary Assessment Handoff:  Lung sounds: Bilateral Breath Sounds: Fine crackles O2 Device: Nasal Cannula O2 Flow Rate (L/min): 2 L/min      R Recommendations: See Admitting Provider Note  Report given to:   Additional Notes: pt speaks spanish

## 2020-08-07 NOTE — H&P (Addendum)
History and Physical:    Anita Contreras   BPZ:[id number] DOB: 16-Jun-1956 DOA: 08/07/2020  Referring MD/provider: Dr Tamala Julian PCP: Frazier Richards, MD   Patient coming from: Home  Chief Complaint: Weakness, abdominal pain and vomiting  History of Present Illness:   Anita Contreras is an 64 y.o. female with HTN, DM 2, ESRD on HD who missed dialysis x2 now presents with 2 days of diarrhea and weakness.  Patient is a poor historian states she does not know why she is feeling unwell.  Notes that she was doing okay until 2 days ago when she had diarrhea and vomiting multiple times a day.  She said she treated the vomiting by not eating much, but she does not have much of an appetite anyway.  She treated the diarrhea with "pills" with resolution of diarrhea.  She did not have any diarrhea or vomiting yesterday.  She has however become progressively weaker and notes she is having a hard time getting about because she is so tired and weak so came to the ED.   Patient does not think she has had any fevers or chills.  She says that her back has been hurting, which is chronic for her but otherwise denies muscle aches.  Patient denies any cough or shortness of breath as far she knows.  No chest pain or palpitations.  No real abdominal pain.  Patient was surprised to hear that she was Covid positive.  She has not had a vaccine.  Does not know of any exposures.  She reiterated she did not think she had any fevers at home.  ED Course:  The patient was noted to have fever to 100.9, to be hypertensive at 200/50 and have 88% O2 saturation on room air.  Patient was also noted to have marked hyperkalemia with K of 7.2.  She was also noted to be Covid positive with chest x-ray suggestive of vascular congestion versus atypical pneumonia.  Nephrology was consulted for urgent dialysis for hyperkalemia and she was treated with insulin/D50, Lasix, Lokelma and calcium.  Patient is now admitted for Covid  infection likely pneumonia.  ROS:   ROS   Review of Systems: General: Denies fever, chills, malaise,  Respiratory: Denies cough, SOB at rest or hemoptysis Cardiovascular: Denies chest pain or palpitations GU: Denies dysuria, frequency or hematuria Blood/lymphatics: Denies easy bruising or bleeding Mood/affect: Denies anxiety/depression    Past Medical History:   Past Medical History:  Diagnosis Date  . Acute cystitis with hematuria   . Anemia   . Cataracts, bilateral   . CKD (chronic kidney disease)   . Diabetic retinopathy, background ([place])   . Dialysis patient ([place])    Tues, Thurs, Sat  . Dizziness   . Hyperlipidemia   . Hypertension   . Migraine variant   . Neuropathy   . OP (osteoporosis)   . Personal history of noncompliance with medical treatment, presenting hazards to health   . Steal syndrome as complication of dialysis access ([place])   . Stroke ([place])   . Type II diabetes mellitus with ophthalmic manifestations, uncontrolled ([place])   . Vitamin D deficiency     Past Surgical History:   Past Surgical History:  Procedure Laterality Date  . A/V FISTULAGRAM Right 06/03/2018   Procedure: A/V FISTULAGRAM;  Surgeon: Algernon Huxley, MD;  Location: [place] [facility];  Service: Cardiovascular;  Laterality: Right;  . A/V FISTULAGRAM Right 02/22/2019   Procedure: A/V FISTULAGRAM;  Surgeon: Algernon Huxley, MD;  Location: [place] [facility];  Service: Cardiovascular;  Laterality: Right;  . A/V SHUNT INTERVENTION N/A 01/26/2018   Procedure: A/V SHUNT INTERVENTION;  Surgeon: Algernon Huxley, MD;  Location: [place] [facility];  Service: Cardiovascular;  Laterality: N/A;  . A/V SHUNTOGRAM Right 01/26/2018   Procedure: A/V SHUNTOGRAM;  Surgeon: Algernon Huxley, MD;  Location: [place] [facility];  Service: Cardiovascular;  Laterality: Right;  . AV FISTULA PLACEMENT Right 10/10/2016   Procedure: ARTERIOVENOUS (AV) FISTULA CREATION ( BRACHIOCEPHALIC );  Surgeon: Algernon Huxley, MD;   Location: [facility];  Service: Vascular;  Laterality: Right;  . DILATION AND CURETTAGE OF UTERUS    . DISTAL REVASCULARIZATION AND INTERVAL LIGATION (DRIL) Right 11/08/2016   Procedure: DISTAL REVASCULARIZATION AND INTERVAL LIGATION (DRIL);  Surgeon: Algernon Huxley, MD;  Location: [facility];  Service: Vascular;  Laterality: Right;  . PERIPHERAL VASCULAR CATHETERIZATION N/A 04/25/2016   Procedure: Dialysis/Perma Catheter Insertion;  Surgeon: Algernon Huxley, MD;  Location: [place] [facility];  Service: Cardiovascular;  Laterality: N/A;  . PERIPHERAL VASCULAR CATHETERIZATION Right 10/17/2016   Procedure: Upper Extremity Angiography;  Surgeon: Algernon Huxley, MD;  Location: [place] [facility];  Service: Cardiovascular;  Laterality: Right;  . UPPER EXTREMITY ANGIOGRAPHY Right 10/12/2018   Procedure: UPPER EXTREMITY ANGIOGRAPHY;  Surgeon: Algernon Huxley, MD;  Location: [place] [facility];  Service: Cardiovascular;  Laterality: Right;    Social History:   Social History   Socioeconomic History  . Marital status: Widowed    Spouse name: Not on file  . Number of children: 4  . Years of education: Not on file  . Highest education level: Not on file  Occupational History  . Not on file  Tobacco Use  . Smoking status: Never Smoker  . Smokeless tobacco: Never Used  Substance and Sexual Activity  . Alcohol use: No  . Drug use: No  . Sexual activity: Never    Birth control/protection: Abstinence  Other Topics Concern  . Not on file  Social History Narrative   Lives at home with family   Social Determinants of Health   Financial Resource Strain:   . Difficulty of Paying Living Expenses: Not on file  Food Insecurity:   . Worried About Charity fundraiser in the Last Year: Not on file  . Ran Out of Food in the Last Year: Not on file  Transportation Needs:   . Lack of Transportation (Medical): Not on file  . Lack of Transportation (Non-Medical): Not on file  Physical Activity:   . Days of Exercise  per Week: Not on file  . Minutes of Exercise per Session: Not on file  Stress:   . Feeling of Stress : Not on file  Social Connections:   . Frequency of Communication with Friends and Family: Not on file  . Frequency of Social Gatherings with Friends and Family: Not on file  . Attends Religious Services: Not on file  . Active Member of Clubs or Organizations: Not on file  . Attends Archivist Meetings: Not on file  . Marital Status: Not on file  Intimate Partner Violence:   . Fear of Current or Ex-Partner: Not on file  . Emotionally Abused: Not on file  . Physically Abused: Not on file  . Sexually Abused: Not on file    Allergies   Dextromethorphan-guaifenesin and Propofol  Family history:   Family History  Problem Relation Age of Onset  . Hypertension Mother   .  Hyperlipidemia Mother     Current Medications:   Prior to Admission medications   Medication Sig Start Date End Date Taking? Authorizing Provider  acetaminophen (TYLENOL) 500 MG tablet Take 1,000 mg by mouth every 6 (six) hours as needed for mild pain or headache.    [provider]  amLODipine (NORVASC) 10 MG tablet Take 1 tablet (10 mg total) by mouth daily. 01/27/18   Bettey Costa, MD  aspirin 81 MG EC tablet TAKE 1 TABLET BY MOUTH EVERY DAY Patient not taking: Reported on 10/12/2018 04/15/17   Stegmayer, Janalyn Harder, PA-C  atorvastatin (LIPITOR) 40 MG tablet Take 40 mg by mouth daily at 6 PM.  Patient not taking: Reported on 05/22/2020    [provider]  calcium acetate (PHOSLO) 667 MG capsule Take 1 capsule (667 mg total) by mouth 3 (three) times daily with meals. 04/19/16   Fritzi Mandes, MD  docusate sodium (COLACE) 100 MG capsule Take 100 mg by mouth 2 (two) times daily. Patient not taking: Reported on 05/22/2020    [provider]  furosemide (LASIX) 20 MG tablet Take 20 mg by mouth daily. Patient not taking: Reported on 05/22/2020    [provider]  gabapentin  (NEURONTIN) 300 MG capsule Take by mouth. Patient not taking: Reported on 05/22/2020    [provider]  hydrALAZINE (APRESOLINE) 50 MG tablet Take 1 tablet (50 mg total) by mouth 3 (three) times daily. 01/27/18   Bettey Costa, MD  hydrOXYzine (ATARAX/VISTARIL) 25 MG tablet Take 25 mg by mouth 2 (two) times daily as needed for itching.    [provider]  insulin NPH-regular Human (NOVOLIN 70/30) (70-30) 100 UNIT/ML injection Inject 6 Units into the skin 2 (two) times daily with a meal.  Patient not taking: Reported on 05/22/2020    [provider]  insulin regular (HUMULIN R) 100 units/mL injection Inject into the skin. Patient not taking: Reported on 05/22/2020    [provider]  lidocaine-prilocaine (EMLA) cream Apply 1 application topically as needed.    [provider]  lisinopril (PRINIVIL,ZESTRIL) 40 MG tablet Take by mouth. Patient not taking: Reported on 05/22/2020    [provider]  losartan (COZAAR) 50 MG tablet Take 1 tablet (50 mg total) by mouth daily. Patient not taking: Reported on 02/15/2019 01/27/18   Bettey Costa, MD  Nutritional Supplements (FEEDING SUPPLEMENT, NEPRO CARB STEADY,) LIQD Take 237 mLs by mouth 2 (two) times daily between meals. Patient not taking: Reported on 05/22/2020 05/07/16   Epifanio Lesches, MD  sodium bicarbonate 650 MG tablet Take 1,300 mg by mouth 2 (two) times daily. Patient not taking: Reported on 05/22/2020    [provider]    Physical Exam:   Vitals:   08/07/20 0753 08/07/20 0900 08/07/20 1000 08/07/20 1030  BP: (!) 198/33 (!) 197/50 (!) 172/48 (!) 155/42  Pulse: 77 78 66 69  Resp:  (!) 28 (!) 24 (!) 25  Temp:      TempSrc:      SpO2: (!) 88% 97% 98% 99%  Weight:      Height:         Physical Exam: Blood pressure (!) 155/42, pulse 69, temperature (!) 100.8 F (38.2 C), temperature source Oral, resp. rate (!) 25, height 4\' 1"  (1.245 m), weight (!) 512 kg, last menstrual period  09/19/2001, SpO2 99 %. Gen: Tired appearing female lying flat in bed in no acute distress. Eyes: sclera anicteric, conjuctiva mildly injected bilaterally CVS: S1-S2, regulary, no  gallops Respiratory:  decreased air entry likely secondary to decreased inspiratory effort GI: NABS, soft, NT  LE: No edema. No cyanosis Neuro: A/O x 3, Moving all extremities equally with normal strength, CN 3-12 intact, grossly nonfocal.  Psych: Conversation is coherent, mood and affect are appropriate, insight is extremely poor. Skin: no rashes or lesions or ulcers,    Data Review:    Labs: Basic Metabolic Panel: Recent Labs  Lab 08/07/20 0841  NA 136  K 7.2*  CL 103  CO2 20*  GLUCOSE 183*  BUN 59*  CREATININE 10.07*  CALCIUM 7.9*   Liver Function Tests: Recent Labs  Lab 08/07/20 0841  AST 40  ALT 19  ALKPHOS 102  BILITOT 1.2  PROT 7.1  ALBUMIN 3.9   No results for input(s): LIPASE, AMYLASE in the last 168 hours. No results for input(s): AMMONIA in the last 168 hours. CBC: Recent Labs  Lab 08/07/20 0841  WBC 7.3  NEUTROABS 6.4  HGB 8.9*  HCT 27.4*  MCV 98.9  PLT 175   Cardiac Enzymes: No results for input(s): CKTOTAL, CKMB, CKMBINDEX, TROPONINI in the last 168 hours.  BNP (last 3 results) No results for input(s): PROBNP in the last 8760 hours. CBG: No results for input(s): GLUCAP in the last 168 hours.  Urinalysis    Component Value Date/Time   COLORURINE YELLOW (A) 08/07/2020 0858   APPEARANCEUR CLEAR (A) 08/07/2020 0858   LABSPEC 1.013 08/07/2020 0858   PHURINE 8.0 08/07/2020 0858   GLUCOSEU NEGATIVE 08/07/2020 0858   HGBUR NEGATIVE 08/07/2020 0858   BILIRUBINUR NEGATIVE 08/07/2020 0858   KETONESUR NEGATIVE 08/07/2020 0858   PROTEINUR >=300 (A) 08/07/2020 0858   NITRITE NEGATIVE 08/07/2020 0858   LEUKOCYTESUR NEGATIVE 08/07/2020 0858      Radiographic Studies: DG Chest Port 1 View  Result Date: 08/07/2020 CLINICAL DATA:  Vomiting, fever, hypoxia EXAM:  PORTABLE CHEST 1 VIEW COMPARISON:  01/20/2020 FINDINGS: Stable cardiomegaly. Atherosclerotic calcification of the aortic knob. Diffuse interstitial and alveolar airspace opacities bilaterally. No appreciable pleural fluid collection. No pneumothorax. Right axillary vascular stent. IMPRESSION: Diffuse interstitial and alveolar airspace opacities bilaterally, findings could reflect pulmonary edema versus pneumonia. Electronically Signed   By: Davina Poke D.O.   On: 08/07/2020 09:04    EKG: Independently reviewed.  Sinus rhythm around 80.  LAD.  May be some slight peaking of T waves.   Assessment/Plan:   Principal Problem:   Hyperkalemia Active Problems:   ESRD on dialysis [facility])   Essential hypertension   Steal syndrome as complication of dialysis access ([place])   Pneumonia due to COVID-19 virus   Type II diabetes mellitus with ophthalmic manifestations, uncontrolled ([place])  64 year old female missed 2 HD sessions and is now admitted for K of 7.2 and new diagnosis of Covid pneumonia.  Hyperkalemia secondary to missed dialysis Patient is to undergo urgent HD in ED before coming to the floor Discussed with Dr. Juleen China who is performing dialysis ASAP.  COVID-19 infection and possible pneumonia She was noted to be hypoxic to 88% on room air This may be vascular congestion or pneumonia secondary to SARS-CoV-2. We will start patient on steroids and remdesivir Repeat chest x-ray in the morning after dialysis Inflammatory markers and procalcitonin are pending Conservative management for vomiting and diarrhea with prn Zofran and Imodium  ESRD HD per renal service as noted above  HTN Continue amlodipine, hydralazine, losartan and Lasix per home doses HD will help with present high blood pressure  DM2 SSI AC at bedtime  Basal insulin can be added once medicines are reconciled if warranted    Other information:   DVT prophylaxis: Subcu heparin ordered. Code Status: Full Family  Communication: Spoke with patient's daughter Therisa Doyne to inform her that her mother was Covid positive and that the rest of the family should be tested especially since they are not vaccinated. Disposition Plan: Home Consults called: Renal Admission status: Inpatient  [facility]  If 7PM-7AM, please contact night-coverage [email or web address] Password [email or web address] 08/07/2020, 11:05 AM

## 2020-08-07 NOTE — ED Notes (Signed)
Remdesiver restarted, sodium bicarb on hold d/t non compatible with remdesiver

## 2020-08-07 NOTE — ED Triage Notes (Signed)
Presents with vomiting and diarrhea since Friday    Febrile on arrival

## 2020-08-08 LAB — COMPREHENSIVE METABOLIC PANEL
ALT: 13 U/L (ref 0–44)
AST: 27 U/L (ref 15–41)
Albumin: 3 g/dL — ABNORMAL LOW (ref 3.5–5.0)
Alkaline Phosphatase: 79 U/L (ref 38–126)
Anion gap: 10 (ref 5–15)
BUN: 26 mg/dL — ABNORMAL HIGH (ref 8–23)
CO2: 36 mmol/L — ABNORMAL HIGH (ref 22–32)
Calcium: 7.5 mg/dL — ABNORMAL LOW (ref 8.9–10.3)
Chloride: 94 mmol/L — ABNORMAL LOW (ref 98–111)
Creatinine, Ser: 5.37 mg/dL — ABNORMAL HIGH (ref 0.44–1.00)
GFR calc Af Amer: 9 mL/min — ABNORMAL LOW (ref >60)
GFR calc non Af Amer: 8 mL/min — ABNORMAL LOW (ref >60)
Glucose, Bld: 190 mg/dL — ABNORMAL HIGH (ref 70–99)
Potassium: 3.9 mmol/L (ref 3.5–5.1)
Sodium: 140 mmol/L (ref 135–145)
Total Bilirubin: 0.8 mg/dL (ref 0.3–1.2)
Total Protein: 5.9 g/dL — ABNORMAL LOW (ref 6.5–8.1)

## 2020-08-08 LAB — CBC WITH DIFFERENTIAL/PLATELET
Abs Immature Granulocytes: 0.03 10*3/uL (ref 0.00–0.07)
Basophils Absolute: 0 10*3/uL (ref 0.0–0.1)
Basophils Relative: 0 %
Eosinophils Absolute: 0 10*3/uL (ref 0.0–0.5)
Eosinophils Relative: 0 %
HCT: 24.2 % — ABNORMAL LOW (ref 36.0–46.0)
Hemoglobin: 8 g/dL — ABNORMAL LOW (ref 12.0–15.0)
Immature Granulocytes: 1 %
Lymphocytes Relative: 15 %
Lymphs Abs: 0.7 10*3/uL (ref 0.7–4.0)
MCH: 31.7 pg (ref 26.0–34.0)
MCHC: 33.1 g/dL (ref 30.0–36.0)
MCV: 96 fL (ref 80.0–100.0)
Monocytes Absolute: 0.4 10*3/uL (ref 0.1–1.0)
Monocytes Relative: 8 %
Neutro Abs: 3.5 10*3/uL (ref 1.7–7.7)
Neutrophils Relative %: 76 %
Platelets: 166 10*3/uL (ref 150–400)
RBC: 2.52 MIL/uL — ABNORMAL LOW (ref 3.87–5.11)
RDW: 14.8 % (ref 11.5–15.5)
WBC: 4.7 10*3/uL (ref 4.0–10.5)
nRBC: 0 % (ref 0.0–0.2)

## 2020-08-08 LAB — PROCALCITONIN: Procalcitonin: 1.62 ng/mL

## 2020-08-08 LAB — GLUCOSE, CAPILLARY
Glucose-Capillary: 182 mg/dL — ABNORMAL HIGH (ref 70–99)
Glucose-Capillary: 225 mg/dL — ABNORMAL HIGH (ref 70–99)
Glucose-Capillary: 321 mg/dL — ABNORMAL HIGH (ref 70–99)
Glucose-Capillary: 489 mg/dL — ABNORMAL HIGH (ref 70–99)

## 2020-08-08 LAB — HIV ANTIBODY (ROUTINE TESTING W REFLEX): HIV Screen 4th Generation wRfx: NONREACTIVE

## 2020-08-08 LAB — HEPATITIS B SURFACE ANTIGEN: Hepatitis B Surface Ag: NONREACTIVE

## 2020-08-08 LAB — HEMOGLOBIN A1C
Hgb A1c MFr Bld: 6.4 % — ABNORMAL HIGH (ref 4.8–5.6)
Mean Plasma Glucose: 136.98 mg/dL

## 2020-08-08 LAB — FIBRIN DERIVATIVES D-DIMER (ARMC ONLY): Fibrin derivatives D-dimer (ARMC): 1474.46 ng/mL (FEU) — ABNORMAL HIGH (ref 0.00–499.00)

## 2020-08-08 LAB — C-REACTIVE PROTEIN: CRP: 9.9 mg/dL — ABNORMAL HIGH (ref <1.0)

## 2020-08-08 MED ORDER — PANTOPRAZOLE SODIUM 40 MG PO TBEC
40.0000 mg | DELAYED_RELEASE_TABLET | Freq: Every day | ORAL | Status: DC
  Administered 2020-08-08 – 2020-08-11 (×4): 40 mg via ORAL
  Filled 2020-08-08 (×4): qty 1

## 2020-08-08 MED ORDER — FAMOTIDINE 20 MG PO TABS: 20.0000 mg | ORAL_TABLET | Freq: Two times a day (BID) | ORAL | Status: DC

## 2020-08-08 MED ORDER — INSULIN ASPART 100 UNIT/ML ~~LOC~~ SOLN
2.0000 [IU] | Freq: Three times a day (TID) | SUBCUTANEOUS | Status: DC
  Administered 2020-08-08 – 2020-08-09 (×4): 2 [IU] via SUBCUTANEOUS
  Filled 2020-08-08 (×4): qty 1

## 2020-08-08 MED ORDER — ALUM & MAG HYDROXIDE-SIMETH 200-200-20 MG/5ML PO SUSP: 15.0000 mL | ORAL | Status: DC | PRN

## 2020-08-08 MED ORDER — CHLORHEXIDINE GLUCONATE CLOTH 2 % EX PADS
6.0000 | MEDICATED_PAD | Freq: Every day | CUTANEOUS | Status: DC
  Administered 2020-08-08 – 2020-08-11 (×4): 6 via TOPICAL

## 2020-08-08 MED ORDER — SUCRALFATE 1 GM/10ML PO SUSP: 1.0000 g | Freq: Three times a day (TID) | ORAL | Status: DC

## 2020-08-08 NOTE — Evaluation (Addendum)
Clinical/Bedside Swallow Evaluation Patient Details  Name: Anita Contreras MRN: [id number] Date of Birth: February 16, 1956  Today's Date: 08/08/2020 Time: SLP Start Time (ACUTE ONLY): 8366 SLP Stop Time (ACUTE ONLY): 1620 SLP Time Calculation (min) (ACUTE ONLY): 50 min  Past Medical History:  Past Medical History:  Diagnosis Date  . Acute cystitis with hematuria   . Anemia   . Cataracts, bilateral   . CKD (chronic kidney disease)   . Diabetic retinopathy, background ([place])   . Dialysis patient ([place])    Tues, Thurs, Sat  . Dizziness   . Hyperlipidemia   . Hypertension   . Migraine variant   . Neuropathy   . OP (osteoporosis)   . Personal history of noncompliance with medical treatment, presenting hazards to health   . Steal syndrome as complication of dialysis access ([place])   . Stroke ([place])   . Type II diabetes mellitus with ophthalmic manifestations, uncontrolled ([place])   . Vitamin D deficiency    Past Surgical History:  Past Surgical History:  Procedure Laterality Date  . A/V FISTULAGRAM Right 06/03/2018   Procedure: A/V FISTULAGRAM;  Surgeon: Algernon Huxley, MD;  Location: [place] [facility];  Service: Cardiovascular;  Laterality: Right;  . A/V FISTULAGRAM Right 02/22/2019   Procedure: A/V FISTULAGRAM;  Surgeon: Algernon Huxley, MD;  Location: [place] [facility];  Service: Cardiovascular;  Laterality: Right;  . A/V SHUNT INTERVENTION N/A 01/26/2018   Procedure: A/V SHUNT INTERVENTION;  Surgeon: Algernon Huxley, MD;  Location: [place] [facility];  Service: Cardiovascular;  Laterality: N/A;  . A/V SHUNTOGRAM Right 01/26/2018   Procedure: A/V SHUNTOGRAM;  Surgeon: Algernon Huxley, MD;  Location: [place] [facility];  Service: Cardiovascular;  Laterality: Right;  . AV FISTULA PLACEMENT Right 10/10/2016   Procedure: ARTERIOVENOUS (AV) FISTULA CREATION ( BRACHIOCEPHALIC );  Surgeon: Algernon Huxley, MD;  Location: [facility];  Service: Vascular;  Laterality: Right;  . DILATION AND CURETTAGE OF  UTERUS    . DISTAL REVASCULARIZATION AND INTERVAL LIGATION (DRIL) Right 11/08/2016   Procedure: DISTAL REVASCULARIZATION AND INTERVAL LIGATION (DRIL);  Surgeon: Algernon Huxley, MD;  Location: [facility];  Service: Vascular;  Laterality: Right;  . PERIPHERAL VASCULAR CATHETERIZATION N/A 04/25/2016   Procedure: Dialysis/Perma Catheter Insertion;  Surgeon: Algernon Huxley, MD;  Location: [place] [facility];  Service: Cardiovascular;  Laterality: N/A;  . PERIPHERAL VASCULAR CATHETERIZATION Right 10/17/2016   Procedure: Upper Extremity Angiography;  Surgeon: Algernon Huxley, MD;  Location: [place] [facility];  Service: Cardiovascular;  Laterality: Right;  . UPPER EXTREMITY ANGIOGRAPHY Right 10/12/2018   Procedure: UPPER EXTREMITY ANGIOGRAPHY;  Surgeon: Algernon Huxley, MD;  Location: [place] [facility];  Service: Cardiovascular;  Laterality: Right;   HPI:  Pt is n 64 y.o. female with HTN, DM 2, ESRD on HD who missed dialysis x2 now presents with 2 days of diarrhea and weakness.  Patient is a poor historian states she does not know why she is feeling unwell.  Notes that she was doing okay until 2 days ago when she had diarrhea and vomiting multiple times a day.  She said she treated the vomiting by not eating much, but she does not have much of an appetite anyway.  She treated the diarrhea with "pills" with resolution of diarrhea.  She did not have any diarrhea or vomiting yesterday.  She has however become progressively weaker and notes she is having a hard time getting about because she is so tired and  weak so came to the ED.  Pt is Covid positive.  She has not had a vaccine.  Chest x-ray suggestive of vascular congestion versus atypical pneumonia.   Assessment / Plan / Recommendation Clinical Impression  This BSE was performed w/ an Interpreter, Constance Holster, as pt is noted as Spanish-speaking. Pt was agreeable to use of an Interpreter. Pt appears to present w/ adequate oropharyngeal phase swallow w/ No oropharyngeal phase  dysphagia noted, No neuromuscular deficits noted.Pt consumed po trials w/ No overt, clinical s/s of aspiration during po trials. Pt appears at reduced risk for aspiration when following general aspiration precautions. During po trials, pt consumed all consistencies w/ no overt coughing, decline in vocal quality, or change in respiratory presentation during/post trials. Oral phase appeared [facility] w/ timely bolus management, mastication, and control of bolus propulsion for A-P transfer for swallowing. Oral clearing achieved w/ all trial consistencies. OM Exam appeared [facility] w/ no unilateral weakness noted. Native dentition. Speech Clear. Pt fed self w/ setup support.  Of note, pt repeatedly c/o concern for N/V stating "it's my stomach that bothers me; I feel nauseated when I think about eating foods". Pt denied that she had difficulty swallowing but that it was the N/V that kept her from eating solid foods; drinking thin liquids was "fine" she stated. This was discussed w/ NSG/MD. Recommend a Regular consistency diet w/ well-Cut meats, moistened foods; Thin liquids. Recommend general aspiration precautions, Pills in a Puree for easier swallowing if needed. Education given on Pills in Puree; food consistencies and easy to eat options; foods of preference d/t N/V feelings(per pt); general aspiration precautions. NSG to reconsult if any new needs arise. NSG/MD updated, agreed.  SLP Visit Diagnosis: Dysphagia, unspecified (R13.10) (N/V per pt)    Aspiration Risk   (reduced following general precs.)    Diet Recommendation  Regular diet w/ thin liquids (cut meats/foods, moistened). General aspiration and Reflux precautions  Medication Administration: Whole meds with liquid (or w/ a Puree IF needed for ease of swallow)    Other  Recommendations Recommended Consults: Consider GI evaluation (dietician f/u) Oral Care Recommendations: Oral care BID;Oral care before and after PO;Patient independent with oral care Other  Recommendations:  (n/a)   Follow up Recommendations None      Frequency and Duration  (n/a)   (n/a)       Prognosis Prognosis for Safe Diet Advancement: Good Barriers to Reach Goals:  (N/V)      Swallow Study   General Date of Onset: 08/07/20 HPI: Pt is n 64 y.o. female with HTN, DM 2, ESRD on HD who missed dialysis x2 now presents with 2 days of diarrhea and weakness.  Patient is a poor historian states she does not know why she is feeling unwell.  Notes that she was doing okay until 2 days ago when she had diarrhea and vomiting multiple times a day.  She said she treated the vomiting by not eating much, but she does not have much of an appetite anyway.  She treated the diarrhea with "pills" with resolution of diarrhea.  She did not have any diarrhea or vomiting yesterday.  She has however become progressively weaker and notes she is having a hard time getting about because she is so tired and weak so came to the ED.  Pt is Covid positive.  She has not had a vaccine.  Chest x-ray suggestive of vascular congestion versus atypical pneumonia. Type of Study: Bedside Swallow Evaluation Previous Swallow Assessment: none Diet Prior to  this Study: Regular;Thin liquids Temperature Spikes Noted: No (wbc 4.7) Respiratory Status: Nasal cannula (2L) History of Recent Intubation: No Behavior/Cognition: Alert;Cooperative;Pleasant mood (Spanish-speaking w/ Interpreter present) Oral Cavity Assessment: Within Functional Limits Oral Care Completed by SLP: Recent completion by staff Oral Cavity - Dentition: Adequate natural dentition Vision: Functional for self-feeding Self-Feeding Abilities: Able to feed self;Needs set up Patient Positioning: Upright in bed (supported w/ pillow) Baseline Vocal Quality: Normal Volitional Cough: Strong Volitional Swallow: Able to elicit    Oral/Motor/Sensory Function Overall Oral Motor/Sensory Function: Within functional limits   Ice Chips Ice chips: Not tested    Thin Liquid Thin Liquid: Within functional limits Presentation: Cup;Self Fed (8 trials; then added in trials of Jello)    Nectar Thick Nectar Thick Liquid: Not tested   Honey Thick Honey Thick Liquid: Not tested   Puree Puree: Within functional limits Presentation: Self Fed;Spoon (4 trials)   Solid     Solid: Within functional limits Presentation: Self Fed (1 cracker(1/2))       Orinda Kenner, MS, CCC-SLP Norene Oliveri 08/08/2020,4:30 PM

## 2020-08-08 NOTE — TOC Initial Note (Signed)
Transition of Care [facility]) - Initial/Assessment Note    Patient Details  Name: Anita Contreras MRN: [id number] Date of Birth: January 17, 1956  Transition of Care [facility]) CM/SW Contact:    Shelbie Hutching, RN Phone Number: 08/08/2020, 10:13 AM  Clinical Narrative:                 Patient admitted for COVID 19, history of ESRD on hemodialysis T,Th,S.  RNCM was able to speak with the patient's daughter, [name], via phone.  [name] reports that the patient lives in [place] with daughter, [name].  [name] reports that she lives close by and visits her mother everyday.  Patient is independent in ADL's, but if she is not feeling well her daughters help her out.  Patient goes to dialysis in [place] and has transportation provided on T and Th and the family provides transportation on Sat.  Patient is current at [facility] and also gets prescriptions from there.  Patient is currently on [place] 2L.  [name] with [facility] is aware of admission.    Expected Discharge Plan: Home/Self Care Barriers to Discharge: Continued Medical Work up   Patient Goals and CMS Choice     Choice offered to / list presented to : NA  Expected Discharge Plan and Services Expected Discharge Plan: Home/Self Care   Discharge Planning Services: CM Consult Post Acute Care Choice: NA Living arrangements for the past 2 months: Mobile Home                   DME Agency: NA       HH Arranged: NA          Prior Living Arrangements/Services Living arrangements for the past 2 months: Mobile Home Lives with:: Adult Children Patient language and need for interpreter reviewed:: Yes Do you feel safe going back to the place where you live?: Yes      Need for Family Participation in Patient Care: Yes (Comment) (COVID and ESRD on dialysis) Care giver support system in place?: Yes (comment) (daughters)   Criminal Activity/Legal Involvement Pertinent to Current Situation/Hospitalization: No - Comment as  needed  Activities of Daily Living      Permission Sought/Granted Permission sought to share information with : Case Manager, Family Supports Permission granted to share information with : Yes, Verbal Permission Granted  Share Information with NAME: [name] and [name]     Permission granted to share info w Relationship: daughters     Emotional Assessment       Orientation: : Oriented to Self, Oriented to Place, Oriented to  Time, Oriented to Situation Alcohol / Substance Use: Not Applicable Psych Involvement: No (comment)  Admission diagnosis:  Hyperkalemia [E87.5] Lymphopenia [D72.810] Hypoxia [R09.02] Hypoxic [R09.02] ESRD (end stage renal disease) ([place]) [N18.6] Fever in other diseases [R50.81] COVID-19 [U07.1] Patient Active Problem List   Diagnosis Date Noted  . Pneumonia due to COVID-19 virus 08/07/2020  . Type II diabetes mellitus with ophthalmic manifestations, uncontrolled ([place])   . Abnormality of albumin 01/20/2020  . Mild protein-calorie malnutrition ([place]) 02/28/2019  . Dependence on renal dialysis ([place]) 02/05/2018  . HTN (hypertension) 01/26/2018  . Headache, unspecified 01/22/2018  . HCAP (healthcare-associated pneumonia) 12/23/2017  . Fever, unspecified 12/23/2017  . Bradycardia 01/17/2017  . Syncope 01/17/2017  . Nausea and vomiting 01/17/2017  . Essential hypertension, malignant 01/17/2017  . Lactic acidosis 01/17/2017  . Leukocytosis 01/17/2017  . Orthostatic hypotension 01/17/2017  . Hyperkalemia 01/14/2017  . Other chronic pain 12/14/2016  .  Steal syndrome as complication of dialysis access ([place]) 11/08/2016  . Pain in left arm 10/23/2016  . ESRD on dialysis ([place]) 10/15/2016  . Renal dialysis device, implant, or graft complication 82/80/0349  . Hyperlipidemia 10/15/2016  . Essential hypertension 10/15/2016  . Controlled diabetes mellitus type 2 with complications ([place]) 17/91/5056  . Disorder of phosphorus metabolism, unspecified 07/01/2016   . Encounter for immunization 06/05/2016  . Other disorders of plasma-protein metabolism, not elsewhere classified 06/01/2016  . Anemia in chronic kidney disease 05/12/2016  . Iron deficiency anemia, unspecified 05/12/2016  . Coagulation defect, unspecified ([place]) 05/06/2016  . Heart failure, unspecified ([place]) 05/06/2016  . Shortness of breath 05/06/2016  . Acute on chronic renal failure ([place]) 04/23/2016  . 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 Acute right flank pain   . ARF (acute renal failure) ([place]) 04/15/2016  . Acute cystitis 05/26/2015  . Neuropathy 05/26/2015   PCP:  Frazier Richards, MD Pharmacy:   [place], [place] [address] [place] [address] Phone: [phone] Fax: [phone]  [place] [address] [place] [address] Phone: [phone] Fax: [phone]  [id number] [address], [place] [address] Phone: [phone] Fax: [phone]     Social Determinants of Health (SDOH) Interventions    Readmission Risk Interventions Readmission Risk Prevention Plan 08/08/2020  Transportation Screening Complete  PCP or Specialist Appt within 3-5 Days Complete  HRI or [place] Complete  Social Work Consult for [place] Planning/Counseling Complete  Palliative Care Screening Not Applicable  Medication Review Press photographer) Complete  Some recent data might be hidden

## 2020-08-08 NOTE — Progress Notes (Signed)
Inpatient Diabetes Program Recommendations  AACE/ADA: New Consensus Statement on Inpatient Glycemic Control   Target Ranges:  Prepandial:   less than 140 mg/dL      Peak postprandial:   less than 180 mg/dL (1-2 hours)      Critically ill patients:  140 - 180 mg/dL   Results for BABE, ANTHIS (MRN [id number]) as of 08/08/2020 12:01  Ref. Range 08/07/2020 17:43 08/07/2020 21:18 08/08/2020 07:29 08/08/2020 11:41  Glucose-Capillary Latest Ref Range: 70 - 99 mg/dL 164 (H) 181 (H) 182 (H) 225 (H)   Review of Glycemic Control   Outpatient Diabetes medications: 70/30 3 units BID Current orders for Inpatient glycemic control: Novlog 0-6 units TID with meals; Decadron 6 mg Q24H  Inpatient Diabetes Program Recommendations:    Insulin-Meal Coverage: If steroids are continued, please consider ordering Novolog 2 units TID with meals for meal coverage if patient eats at least 50% of meals.  Thanks, Barnie Alderman, RN, MSN, CDE Diabetes Coordinator Inpatient Diabetes Program [phone] (Team Pager from 8am to 5pm)

## 2020-08-08 NOTE — Progress Notes (Signed)
PROGRESS NOTE    Anita Contreras  TML:[id number] DOB: May 01, 1956 DOA: 08/07/2020 PCP: Frazier Richards, MD    Brief Narrative:  Anita Contreras is an 64 y.o. female with HTN, DM 2, ESRD on HD who missed dialysis x2 now presents with 2 days of diarrhea and weakness.  Patient is a poor historian states she does not know why she is feeling unwell.  Notes that she was doing okay until 2 days ago when she had diarrhea and vomiting multiple times a day.  She said she treated the vomiting by not eating much, but she does not have much of an appetite anyway.  She treated the diarrhea with "pills" with resolution of diarrhea.  She did not have any diarrhea or vomiting yesterday.  She has however become progressively weaker and notes she is having a hard time getting about because she is so tired and weak so came to the ED.  Patient Covid positive.  Unvaccinated  8/31: Patient seen and examined.  Remains on 2 L nasal cannula.  Fevers noted overnight.  T-max 103.2.  Hemodynamically stable.  Emergent HD yesterday.  Hyperkalemia resolved.  Discussed with nephrology.  Plan for repeat HD today.  Assessment & Plan:   Principal Problem:   Hyperkalemia Active Problems:   ESRD on dialysis [facility])   Essential hypertension   Steal syndrome as complication of dialysis access ([place])   Pneumonia due to COVID-19 virus   Type II diabetes mellitus with ophthalmic manifestations, uncontrolled ([place]) 64 year old female missed 2 HD sessions and is now admitted for K of 7.2 and new diagnosis of Covid pneumonia.  Hyperkalemia secondary to missed dialysis Potassium 7.2 on admission Emergent HD on 08/07/2020 Serum potassium recovered Plan: Hold [place] HD today per nephrology  COVID-19 infection and likely pneumonia She was noted to be hypoxic to 88% on room air This may be vascular congestion or pneumonia secondary to SARS-CoV-2. Fevers overnight indicated infectious focus Procalcitonin mildly  elevated This may be affected by ESRD Plan: Continue steroids and remdesivir, 08/07/2020- Trend pro calcitonin Consider starting antibiotics if patient continues to be febrile procalcitonin uptrending Trend inflammatory markers Prone as tolerated Incentive spirometry  ESRD on HD TTS HD per renal service as noted above  Hypertensive urgency Blood pressure is improved since admission Still suboptimal control Plan: Continue amlodipine, hydralazine, losartan and Lasix per home doses HD will help with present high blood pressure  DM2 SSI AC at bedtime Basal insulin can be added once medicines are reconciled if warranted  Sugars reasonably well controlled over interval     DVT prophylaxis: Heparin subcu Code Status: Full Family Communication: Attempted to call daughter [name].  No answer and voicemail not set up Disposition Plan: Status is: Inpatient  Remains inpatient appropriate because:Inpatient level of care appropriate due to severity of illness   Dispo: The patient is from: Home              Anticipated d/c is to: Home              Anticipated d/c date is: 2 days              Patient currently is not medically stable to d/c.   Mildly hypoxic.  Currently requiring 2 L.  Suspect was to be ability to titrate this off with hemodialysis today.  Patient has been febrile.  T-max 103.2.  Suspect this is all secondary to Covid.  Patient is unvaccinated.  Anticipate 2 additional days of inpatient monitoring and treatment prior  to disposition planning.  Consultants:   Nephrology-[place]  Procedures:   Hemodialysis  Antimicrobials:   Remdesivir   Subjective: Seen and examined.  Complains of fatigue.  Objective: Vitals:   08/07/20 2155 08/07/20 2338 08/08/20 0450 08/08/20 0728  BP: (!) 148/60 (!) 129/46 (!) 153/47 (!) 143/42  Pulse:  78 73 68  Resp:  17 18 (!) 22  Temp: 100.2 F (37.9 C) (!) 100.4 F (38 C) 100.2 F (37.9 C) 100.3 F (37.9 C)  TempSrc:  Oral  Oral   SpO2:  95% 92% 92%  Weight:      Height:        Intake/Output Summary (Last 24 hours) at 08/08/2020 1035 Last data filed at 08/08/2020 0300 Gross per 24 hour  Intake 933.87 ml  Output 1500 ml  Net -566.13 ml   Filed Weights   08/07/20 0752 08/07/20 1230 08/07/20 1324  Weight: (!) 512 kg 55.4 kg 55.4 kg    Examination:  General exam: No apparent distress. Respiratory system: Scattered crackles bilaterally.  Mild and expiratory wheeze.  Normal work of breathing.  2 L Cardiovascular system: S1 & S2 heard, RRR. No JVD, murmurs, rubs, gallops or clicks.  Trace pedal edema Gastrointestinal system: Abdomen is nondistended, soft and nontender. No organomegaly or masses felt. Normal bowel sounds heard. Central nervous system: Alert and oriented. No focal neurological deficits. Extremities: Symmetric 5 x 5 power. Skin: No rashes, lesions or ulcers Psychiatry: Poor insight into disease process.  Flattened affect    Data Reviewed: I have personally reviewed following labs and imaging studies  CBC: Recent Labs  Lab 08/07/20 0841 08/08/20 0508  WBC 7.3 4.7  NEUTROABS 6.4 3.5  HGB 8.9* 8.0*  HCT 27.4* 24.2*  MCV 98.9 96.0  PLT 175 709   Basic Metabolic Panel: Recent Labs  Lab 08/07/20 0841 08/08/20 0508  NA 136 140  K 7.2* 3.9  CL 103 94*  CO2 20* 36*  GLUCOSE 183* 190*  BUN 59* 26*  CREATININE 10.07* 5.37*  CALCIUM 7.9* 7.5*   GFR: Estimated Creatinine Clearance: 5.7 mL/min (A) (by C-G formula based on SCr of 5.37 mg/dL (H)). Liver Function Tests: Recent Labs  Lab 08/07/20 0841 08/08/20 0508  AST 40 27  ALT 19 13  ALKPHOS 102 79  BILITOT 1.2 0.8  PROT 7.1 5.9*  ALBUMIN 3.9 3.0*   No results for input(s): LIPASE, AMYLASE in the last 168 hours. No results for input(s): AMMONIA in the last 168 hours. Coagulation Profile: No results for input(s): INR, PROTIME in the last 168 hours. Cardiac Enzymes: No results for input(s): CKTOTAL, CKMB, CKMBINDEX,  TROPONINI in the last 168 hours. BNP (last 3 results) No results for input(s): PROBNP in the last 8760 hours. HbA1C: No results for input(s): HGBA1C in the last 72 hours. CBG: Recent Labs  Lab 08/07/20 1743 08/07/20 2118 08/08/20 0729  GLUCAP 164* 181* 182*   Lipid Profile: No results for input(s): CHOL, HDL, LDLCALC, TRIG, CHOLHDL, LDLDIRECT in the last 72 hours. Thyroid Function Tests: No results for input(s): TSH, T4TOTAL, FREET4, T3FREE, THYROIDAB in the last 72 hours. Anemia Panel: Recent Labs    08/07/20 1159  FERRITIN 1,477*   Sepsis Labs: Recent Labs  Lab 08/07/20 0841 08/07/20 1159  PROCALCITON  --  0.62  LATICACIDVEN 1.4  --     Recent Results (from the past 240 hour(s))  SARS Coronavirus 2 by RT PCR (hospital order, performed in [facility] hospital lab) Nasopharyngeal Urine, Catheterized  Status: Abnormal   Collection Time: 08/07/20  8:41 AM   Specimen: Urine, Catheterized; Nasopharyngeal  Result Value Ref Range Status   SARS Coronavirus 2 POSITIVE (A) NEGATIVE Final    Comment: RESULT CALLED TO, READ BACK BY AND VERIFIED WITH: MARY NEEDHAM AT 1019 ON 08/07/2020 [place]. (NOTE) SARS-CoV-2 target nucleic acids are DETECTED  SARS-CoV-2 RNA is generally detectable in upper respiratory specimens  during the acute phase of infection.  Positive results are indicative  of the presence of the identified virus, but do not rule out bacterial infection or co-infection with other pathogens not detected by the test.  Clinical correlation with patient history and  other diagnostic information is necessary to determine patient infection status.  The expected result is negative.  Fact Sheet for Patients:   StrictlyIdeas.no   Fact Sheet for Healthcare Providers:   BankingDealers.co.za    This test is not yet approved or cleared by the [place] FDA and  has been authorized for detection and/or diagnosis of SARS-CoV-2  by FDA under an Emergency Use Authorization (EUA).  This EUA will remain in effect (meaning thi s test can be used) for the duration of  the COVID-19 declaration under Section 564(b)(1) of the Act, 21 U.S.C. section 360-bbb-3(b)(1), unless the authorization is terminated or revoked sooner.  Performed at [facility], [address], [place], [place] [address]          Radiology Studies: [facility] Chest [place] 1 View  Result Date: 08/07/2020 CLINICAL DATA:  Vomiting, fever, hypoxia EXAM: PORTABLE CHEST 1 VIEW COMPARISON:  01/20/2020 FINDINGS: Stable cardiomegaly. Atherosclerotic calcification of the aortic knob. Diffuse interstitial and alveolar airspace opacities bilaterally. No appreciable pleural fluid collection. No pneumothorax. Right axillary vascular stent. IMPRESSION: Diffuse interstitial and alveolar airspace opacities bilaterally, findings could reflect pulmonary edema versus pneumonia. Electronically Signed   By: Davina Poke D.O.   On: 08/07/2020 09:04        Scheduled Meds: . amLODipine  10 mg Oral Daily  . aspirin EC  81 mg Oral Daily  . atorvastatin  40 mg Oral q1800  . calcium acetate  2,001 mg Oral TID WC  . Chlorhexidine Gluconate Cloth  6 each Topical Daily  . dexamethasone (DECADRON) injection  6 mg Intravenous Q24H  . epoetin (EPOGEN/PROCRIT) injection  10,000 Units Intravenous Q T,Th,Sa-HD  . furosemide  20 mg Oral Daily  . heparin  5,000 Units Subcutaneous Q8H  . hydrALAZINE  50 mg Oral TID  . insulin aspart  0-6 Units Subcutaneous TID WC  . losartan  50 mg Oral Daily  . sodium chloride flush  3 mL Intravenous Q12H   Continuous Infusions: . sodium chloride    . remdesivir 100 mg in NS 100 mL    . sodium bicarbonate 150 mEq in dextrose 5% 1000 mL 150 mEq (08/08/20 1007)     LOS: 1 day    Time spent: 25 minutes    Sidney Ace, MD [facility] Pager [phone]  If 7PM-7AM, please contact night-coverage 08/08/2020, 10:35  AM

## 2020-08-08 NOTE — Progress Notes (Signed)
Hemodialysis patient known at [place] 6:00am. However, patient will be treating at [facility] upon discharge at [place] 11:00am. If family can not transport to treatments, home clinics stated they may be able to arrange transportation. Please contact me with any dialysis placement concerns.  [name] Dialysis Coordinator [phone]

## 2020-08-08 NOTE — Progress Notes (Signed)
[facility]  ROUNDING NOTE   Subjective:   [name] was admitted to [facility] on 08/07/2020 for Hyperkalemia [E87.5] Lymphopenia [D72.810] Hypoxia [R09.02] Hypoxic [R09.02] ESRD (end stage renal disease) ([place]) [N18.6] Fever in other diseases [R50.81] COVID-19 [U07.1]  Patient sitting up in bed, in no acute distress, pleasant and cooperative, communicated by using the remote interpreter services.  Complains of dysphagia, nausea and vomiting.  Patient denies SOB, enquiring about discharge plans.  Tmax 103.2  Objective:  Vital signs in last 24 hours:  Temp:  [98.6 F (37 C)-103.2 F (39.6 C)] 100.3 F (37.9 C) (08/31 0728) Pulse Rate:  [63-94] 63 (08/31 1109) Resp:  [16-28] 17 (08/31 1109) BP: (122-199)/(36-98) 122/36 (08/31 1109) SpO2:  [89 %-100 %] 97 % (08/31 1109)  Weight change:  Filed Weights   08/07/20 0752 08/07/20 1230 08/07/20 1324  Weight: (!) 512 kg 55.4 kg 55.4 kg    Intake/Output: I/O last 3 completed shifts: In: 933.9 [P.O.:240; I.V.:643.9; IV Piggyback:50] Out: 1500 [Other:1500]   Intake/Output this shift:  No intake/output data recorded.  Physical Exam: General: No acute distress  Head: Oal mucosal membranes moist  Eyes: Sclerae and conjunctivae clear   Neck: Supple, trachea at the midline  Lungs:  Normal symmetrical respiratory effort, Lungs clear  Heart: S1S2, Regular rate and rhythm  Abdomen:  Non distended, non tender  Extremities: Trace  peripheral edema.  Neurologic: Alert, oriented, answers simple questions appropriately  Skin: No rashes or lesions,Rt AVF with thrill +, bruit +  Access: Right AVF    Basic Metabolic Panel: Recent Labs  Lab 08/07/20 0841 08/08/20 0508  NA 136 140  K 7.2* 3.9  CL 103 94*  CO2 20* 36*  GLUCOSE 183* 190*  BUN 59* 26*  CREATININE 10.07* 5.37*  CALCIUM 7.9* 7.5*    Liver Function Tests: Recent Labs  Lab 08/07/20 0841 08/08/20 0508  AST 40 27  ALT 19 13  ALKPHOS  102 79  BILITOT 1.2 0.8  PROT 7.1 5.9*  ALBUMIN 3.9 3.0*   No results for input(s): LIPASE, AMYLASE in the last 168 hours. No results for input(s): AMMONIA in the last 168 hours.  CBC: Recent Labs  Lab 08/07/20 0841 08/08/20 0508  WBC 7.3 4.7  NEUTROABS 6.4 3.5  HGB 8.9* 8.0*  HCT 27.4* 24.2*  MCV 98.9 96.0  PLT 175 166    Cardiac Enzymes: No results for input(s): CKTOTAL, CKMB, CKMBINDEX, TROPONINI in the last 168 hours.  BNP: Invalid input(s): POCBNP  CBG: Recent Labs  Lab 08/07/20 1743 08/07/20 2118 08/08/20 0729 08/08/20 1141  GLUCAP 164* 181* 182* 225*    Microbiology: Results for orders placed or performed during the hospital encounter of 08/07/20  SARS Coronavirus 2 by RT PCR (hospital order, performed in [facility] hospital lab) Nasopharyngeal Urine, Catheterized     Status: Abnormal   Collection Time: 08/07/20  8:41 AM   Specimen: Urine, Catheterized; Nasopharyngeal  Result Value Ref Range Status   SARS Coronavirus 2 POSITIVE (A) NEGATIVE Final    Comment: RESULT CALLED TO, READ BACK BY AND VERIFIED WITH: MARY NEEDHAM AT 1019 ON 08/07/2020 [place]. (NOTE) SARS-CoV-2 target nucleic acids are DETECTED  SARS-CoV-2 RNA is generally detectable in upper respiratory specimens  during the acute phase of infection.  Positive results are indicative  of the presence of the identified virus, but do not rule out bacterial infection or co-infection with other pathogens not detected by the test.  Clinical correlation with patient history and  other diagnostic information is necessary to determine patient infection status.  The expected result is negative.  Fact Sheet for Patients:   StrictlyIdeas.no   Fact Sheet for Healthcare Providers:   BankingDealers.co.za    This test is not yet approved or cleared by the [place] FDA and  has been authorized for detection and/or diagnosis of SARS-CoV-2 by FDA under an  Emergency Use Authorization (EUA).  This EUA will remain in effect (meaning thi s test can be used) for the duration of  the COVID-19 declaration under Section 564(b)(1) of the Act, 21 U.S.C. section 360-bbb-3(b)(1), unless the authorization is terminated or revoked sooner.  Performed at [facility], [address] [place], [place] [address]     Coagulation Studies: No results for input(s): LABPROT, INR in the last 72 hours.  Urinalysis: Recent Labs    08/07/20 0858  COLORURINE YELLOW*  LABSPEC 1.013  PHURINE 8.0  GLUCOSEU NEGATIVE  HGBUR NEGATIVE  BILIRUBINUR NEGATIVE  KETONESUR NEGATIVE  PROTEINUR >=300*  NITRITE NEGATIVE  LEUKOCYTESUR NEGATIVE      Imaging: DG Chest Port 1 View  Result Date: 08/07/2020 CLINICAL DATA:  Vomiting, fever, hypoxia EXAM: PORTABLE CHEST 1 VIEW COMPARISON:  01/20/2020 FINDINGS: Stable cardiomegaly. Atherosclerotic calcification of the aortic knob. Diffuse interstitial and alveolar airspace opacities bilaterally. No appreciable pleural fluid collection. No pneumothorax. Right axillary vascular stent. IMPRESSION: Diffuse interstitial and alveolar airspace opacities bilaterally, findings could reflect pulmonary edema versus pneumonia. Electronically Signed   By: Davina Poke D.O.   On: 08/07/2020 09:04     Medications:   . sodium chloride    . remdesivir 100 mg in NS 100 mL 100 mg (08/08/20 1223)  . sodium bicarbonate 150 mEq in dextrose 5% 1000 mL 150 mEq (08/08/20 1007)   . amLODipine  10 mg Oral Daily  . aspirin EC  81 mg Oral Daily  . atorvastatin  40 mg Oral q1800  . calcium acetate  2,001 mg Oral TID WC  . Chlorhexidine Gluconate Cloth  6 each Topical Daily  . dexamethasone (DECADRON) injection  6 mg Intravenous Q24H  . epoetin (EPOGEN/PROCRIT) injection  10,000 Units Intravenous Q T,Th,Sa-HD  . furosemide  20 mg Oral Daily  . heparin  5,000 Units Subcutaneous Q8H  . hydrALAZINE  50 mg Oral TID  . insulin aspart   0-6 Units Subcutaneous TID WC  . losartan  50 mg Oral Daily  . sodium chloride flush  3 mL Intravenous Q12H   sodium chloride, acetaminophen, hydrOXYzine, loperamide, methadone, ondansetron **OR** ondansetron (ZOFRAN) IV, polyethylene glycol, sodium chloride flush  Assessment/ Plan:  [name] is a 64 y.o. Hispanic Spanish Speaking only female with end stage renal disease on hemodialysis, insulin-dependent diabetes, diabetic retinopathy, hypertension, hyperlipidemia, history of hemorrhagic cystitis, migraine headaches, osteoarthritis, osteoporosis who is admitted to [facility] on Hyperkalemia [E87.5] Lymphopenia [D72.810] Hypoxia [R09.02] Hypoxic [R09.02] ESRD (end stage renal disease) ([employer]) [N18.6] Fever in other diseases [R50.81] COVID-19 [U07.1]   [facility] right upper arm AV fistula 51.5kg  1. End Stage Renal Disease with hyperkalemia: emergent dialysis on admission.   Patient will get dialyzed today again.  Cr   down to 5.4  K+ down to 3.9 today  2. Hypertension: Controlled with current treatment  Home regimen of amlodipine, hydralazine, losartan - recommend holding losartan due to hyperkalemia.   3. Anemia of chronic kidney disease: hemoglobin 8.0. Receiving Epogen with dialysis  Plan to continue  Mircera as outpatient. Marland Kitchen   4.  Secondary Hyperparathyroidism:  Currently on Phos[place] , will keep monitoring  Patient was seen and examined with Crosby Oyster, DNP. Hemodialysis scheduled for today. Orders prepared. Above plan was discussed and agreed upon on the signing of this note.     LOS: 1 Lucetta Baehr 8/31/20211:30 PM

## 2020-08-08 NOTE — Plan of Care (Signed)
  Problem: Education: Goal: Knowledge of General Education information will improve Description Including pain rating scale, medication(s)/side effects and non-pharmacologic comfort measures Outcome: Progressing   

## 2020-08-09 DIAGNOSIS — E785 Hyperlipidemia, unspecified: Secondary | ICD-10-CM | POA: Diagnosis not present

## 2020-08-09 DIAGNOSIS — I10 Essential (primary) hypertension: Secondary | ICD-10-CM | POA: Diagnosis not present

## 2020-08-09 DIAGNOSIS — R531 Weakness: Secondary | ICD-10-CM

## 2020-08-09 DIAGNOSIS — E1122 Type 2 diabetes mellitus with diabetic chronic kidney disease: Secondary | ICD-10-CM | POA: Diagnosis not present

## 2020-08-09 DIAGNOSIS — U071 COVID-19: Secondary | ICD-10-CM | POA: Diagnosis not present

## 2020-08-09 LAB — GLUCOSE, CAPILLARY
Glucose-Capillary: 245 mg/dL — ABNORMAL HIGH (ref 70–99)
Glucose-Capillary: 166 mg/dL — ABNORMAL HIGH (ref 70–99)
Glucose-Capillary: 289 mg/dL — ABNORMAL HIGH (ref 70–99)
Glucose-Capillary: 264 mg/dL — ABNORMAL HIGH (ref 70–99)
Glucose-Capillary: 296 mg/dL — ABNORMAL HIGH (ref 70–99)

## 2020-08-09 LAB — COMPREHENSIVE METABOLIC PANEL
ALT: 14 U/L (ref 0–44)
AST: 33 U/L (ref 15–41)
Albumin: 2.8 g/dL — ABNORMAL LOW (ref 3.5–5.0)
Alkaline Phosphatase: 68 U/L (ref 38–126)
Anion gap: 12 (ref 5–15)
BUN: 22 mg/dL (ref 8–23)
CO2: 35 mmol/L — ABNORMAL HIGH (ref 22–32)
Calcium: 7.8 mg/dL — ABNORMAL LOW (ref 8.9–10.3)
Chloride: 92 mmol/L — ABNORMAL LOW (ref 98–111)
Creatinine, Ser: 3.76 mg/dL — ABNORMAL HIGH (ref 0.44–1.00)
GFR calc Af Amer: 14 mL/min — ABNORMAL LOW (ref >60)
GFR calc non Af Amer: 12 mL/min — ABNORMAL LOW (ref >60)
Glucose, Bld: 201 mg/dL — ABNORMAL HIGH (ref 70–99)
Potassium: 3.8 mmol/L (ref 3.5–5.1)
Sodium: 139 mmol/L (ref 135–145)
Total Bilirubin: 0.8 mg/dL (ref 0.3–1.2)
Total Protein: 5.5 g/dL — ABNORMAL LOW (ref 6.5–8.1)

## 2020-08-09 LAB — CBC WITH DIFFERENTIAL/PLATELET
Abs Immature Granulocytes: 0.03 10*3/uL (ref 0.00–0.07)
Basophils Absolute: 0 10*3/uL (ref 0.0–0.1)
Basophils Relative: 0 %
Eosinophils Absolute: 0 10*3/uL (ref 0.0–0.5)
Eosinophils Relative: 0 %
HCT: 25.4 % — ABNORMAL LOW (ref 36.0–46.0)
Hemoglobin: 8.3 g/dL — ABNORMAL LOW (ref 12.0–15.0)
Immature Granulocytes: 1 %
Lymphocytes Relative: 13 %
Lymphs Abs: 0.7 10*3/uL (ref 0.7–4.0)
MCH: 31.3 pg (ref 26.0–34.0)
MCHC: 32.7 g/dL (ref 30.0–36.0)
MCV: 95.8 fL (ref 80.0–100.0)
Monocytes Absolute: 0.4 10*3/uL (ref 0.1–1.0)
Monocytes Relative: 6 %
Neutro Abs: 4.4 10*3/uL (ref 1.7–7.7)
Neutrophils Relative %: 80 %
Platelets: 180 10*3/uL (ref 150–400)
RBC: 2.65 MIL/uL — ABNORMAL LOW (ref 3.87–5.11)
RDW: 14.3 % (ref 11.5–15.5)
WBC: 5.5 10*3/uL (ref 4.0–10.5)
nRBC: 0 % (ref 0.0–0.2)

## 2020-08-09 LAB — C-REACTIVE PROTEIN: CRP: 9.3 mg/dL — ABNORMAL HIGH (ref <1.0)

## 2020-08-09 LAB — FIBRIN DERIVATIVES D-DIMER (ARMC ONLY): Fibrin derivatives D-dimer (ARMC): 1259.32 ng/mL (FEU) — ABNORMAL HIGH (ref 0.00–499.00)

## 2020-08-09 LAB — HEPATITIS B SURFACE ANTIBODY, QUANTITATIVE: Hep B S AB Quant (Post): 59.9 m[IU]/mL (ref >9.9)

## 2020-08-09 NOTE — Progress Notes (Signed)
[facility]  ROUNDING NOTE   Subjective:   Hemodialysis treatment yesterday. Tolerated treatment well. UF of 1 liter.   History taken with assistance of Spanish Interpreter.   Objective:  Vital signs in last 24 hours:  Temp:  [97.6 F (36.4 C)-98.9 F (37.2 C)] 98.9 F (37.2 C) (09/01 1159) Pulse Rate:  [63-87] 64 (09/01 1159) Resp:  [15-24] 18 (09/01 1159) BP: (103-170)/(29-76) 134/39 (09/01 1159) SpO2:  [89 %-100 %] 98 % (09/01 1159)  Weight change:  Filed Weights   08/07/20 0752 08/07/20 1230 08/07/20 1324  Weight: (!) 512 kg 55.4 kg 55.4 kg    Intake/Output: I/O last 3 completed shifts: In: 2015.5 [P.O.:360; I.V.:1540.2; IV Piggyback:115.4] Out: 1000 [Other:1000]   Intake/Output this shift:  No intake/output data recorded.  Physical Exam: General: No acute distress  Head: Oral mucosal membranes moist  Eyes: PERRL   Neck: Supple, trachea at the midline  Lungs:  Clear, 2L [place] O2  Heart: regular  Abdomen:  Non distended, non tender  Extremities: No peripheral edema.  Neurologic: Alert, oriented   Skin:  no lesions  Access: Right AVF    Basic Metabolic Panel: Recent Labs  Lab 08/07/20 0841 08/08/20 0508 08/09/20 0633  NA 136 140 139  K 7.2* 3.9 3.8  CL 103 94* 92*  CO2 20* 36* 35*  GLUCOSE 183* 190* 201*  BUN 59* 26* 22  CREATININE 10.07* 5.37* 3.76*  CALCIUM 7.9* 7.5* 7.8*    Liver Function Tests: Recent Labs  Lab 08/07/20 0841 08/08/20 0508 08/09/20 0633  AST 40 27 33  ALT 19 13 14   ALKPHOS 102 79 68  BILITOT 1.2 0.8 0.8  PROT 7.1 5.9* 5.5*  ALBUMIN 3.9 3.0* 2.8*   No results for input(s): LIPASE, AMYLASE in the last 168 hours. No results for input(s): AMMONIA in the last 168 hours.  CBC: Recent Labs  Lab 08/07/20 0841 08/08/20 0508 08/09/20 0633  WBC 7.3 4.7 5.5  NEUTROABS 6.4 3.5 4.4  HGB 8.9* 8.0* 8.3*  HCT 27.4* 24.2* 25.4*  MCV 98.9 96.0 95.8  PLT 175 166 180    Cardiac Enzymes: No results for input(s):  CKTOTAL, CKMB, CKMBINDEX, TROPONINI in the last 168 hours.  BNP: Invalid input(s): POCBNP  CBG: Recent Labs  Lab 08/08/20 1652 08/08/20 2136 08/09/20 0147 08/09/20 0833 08/09/20 1157  GLUCAP 321* 489* 245* 166* 289*    Microbiology: Results for orders placed or performed during the hospital encounter of 08/07/20  SARS Coronavirus 2 by RT PCR (hospital order, performed in [facility] hospital lab) Nasopharyngeal Urine, Catheterized     Status: Abnormal   Collection Time: 08/07/20  8:41 AM   Specimen: Urine, Catheterized; Nasopharyngeal  Result Value Ref Range Status   SARS Coronavirus 2 POSITIVE (A) NEGATIVE Final    Comment: RESULT CALLED TO, READ BACK BY AND VERIFIED WITH: MARY NEEDHAM AT 1019 ON 08/07/2020 [place]. (NOTE) SARS-CoV-2 target nucleic acids are DETECTED  SARS-CoV-2 RNA is generally detectable in upper respiratory specimens  during the acute phase of infection.  Positive results are indicative  of the presence of the identified virus, but do not rule out bacterial infection or co-infection with other pathogens not detected by the test.  Clinical correlation with patient history and  other diagnostic information is necessary to determine patient infection status.  The expected result is negative.  Fact Sheet for Patients:   StrictlyIdeas.no   Fact Sheet for Healthcare Providers:   BankingDealers.co.za    This test is not  yet approved or cleared by the [place] and  has been authorized for detection and/or diagnosis of SARS-CoV-2 by FDA under an Emergency Use Authorization (EUA).  This EUA will remain in effect (meaning thi s test can be used) for the duration of  the COVID-19 declaration under Section 564(b)(1) of the Act, 21 U.S.C. section 360-bbb-3(b)(1), unless the authorization is terminated or revoked sooner.  Performed at [facility], [address] [place], [place] [address]      Coagulation Studies: No results for input(s): LABPROT, INR in the last 72 hours.  Urinalysis: Recent Labs    08/07/20 0858  COLORURINE YELLOW*  LABSPEC 1.013  PHURINE 8.0  GLUCOSEU NEGATIVE  HGBUR NEGATIVE  BILIRUBINUR NEGATIVE  KETONESUR NEGATIVE  PROTEINUR >=300*  NITRITE NEGATIVE  LEUKOCYTESUR NEGATIVE      Imaging: No results found.   Medications:   . sodium chloride    . remdesivir 100 mg in NS 100 mL 100 mg (08/09/20 0938)  . sodium bicarbonate 150 mEq in dextrose 5% 1000 mL 150 mEq (08/09/20 1428)   . amLODipine  10 mg Oral Daily  . aspirin EC  81 mg Oral Daily  . atorvastatin  40 mg Oral q1800  . calcium acetate  2,001 mg Oral TID WC  . Chlorhexidine Gluconate Cloth  6 each Topical Daily  . dexamethasone (DECADRON) injection  6 mg Intravenous Q24H  . epoetin (EPOGEN/PROCRIT) injection  10,000 Units Intravenous Q T,Th,Sa-HD  . furosemide  20 mg Oral Daily  . heparin  5,000 Units Subcutaneous Q8H  . hydrALAZINE  50 mg Oral TID  . insulin aspart  0-6 Units Subcutaneous TID WC  . insulin aspart  2 Units Subcutaneous TID WC  . losartan  50 mg Oral Daily  . pantoprazole  40 mg Oral Daily  . sodium chloride flush  3 mL Intravenous Q12H   sodium chloride, acetaminophen, alum & mag hydroxide-simeth, hydrOXYzine, loperamide, methadone, ondansetron **OR** ondansetron (ZOFRAN) IV, polyethylene glycol, sodium chloride flush  Assessment/ Plan:  [name] is a 64 y.o. Hispanic Spanish Speaking only female with end stage renal disease on hemodialysis, insulin-dependent diabetes, diabetic retinopathy, hypertension, hyperlipidemia, history of hemorrhagic cystitis, migraine headaches, osteoarthritis, osteoporosis who is admitted to [facility] on Hyperkalemia [E87.5] Lymphopenia [D72.810] Hypoxia [R09.02] Hypoxic [R09.02] ESRD (end stage renal disease) ([place]) [N18.6] Fever in other diseases [R50.81] COVID-19 [U07.1]   [facility] [place] right  upper arm AV fistula 51.5kg  1. End Stage Renal Disease with hyperkalemia: emergent dialysis on admission.  Hemodialysis yesterday. Continue TTS schedule.   2. Hypertension:  Home regimen of amlodipine, hydralazine, losartan  3. Anemia of chronic kidney disease: hemoglobin 8.3 Receiving Epogen with dialysis  Plan to continue  Mircera as outpatient. .   4. Secondary Hyperparathyroidism:  - calcium acetate with meals.    LOS: 2 Breck Hollinger 9/1/20212:29 PM

## 2020-08-09 NOTE — Progress Notes (Signed)
Patient ID: Anita Contreras, female   DOB: 12-31-1955, 65 y.o.   MRN: [id number] [facility] PROGRESS NOTE  Brittny Spangle [id number]:[id number] DOB: 10-Aug-1956 DOA: 08/07/2020 PCP: Frazier Richards, MD  HPI/Subjective: Patient states that she has pains in her hands and her back and all over.  Patient states that her breathing is okay.  I told the patient that she had a Covid infection and she did not believe me.  Patient states that she had a lot of sweating especially at night.  Objective: Vitals:   08/09/20 0922 08/09/20 1159  BP: (!) 149/76 (!) 134/39  Pulse: 87 64  Resp: (!) 24 18  Temp: 98.4 F (36.9 C) 98.9 F (37.2 C)  SpO2: (!) 89% 98%    Intake/Output Summary (Last 24 hours) at 08/09/2020 1548 Last data filed at 08/09/2020 0045 Gross per 24 hour  Intake 1131.67 ml  Output 1000 ml  Net 131.67 ml   Filed Weights   08/07/20 0752 08/07/20 1230 08/07/20 1324  Weight: (!) 512 kg 55.4 kg 55.4 kg    ROS: Review of Systems  Constitutional: Positive for diaphoresis.  Respiratory: Positive for cough. Negative for shortness of breath.   Cardiovascular: Negative for chest pain.  Gastrointestinal: Negative for abdominal pain, nausea and vomiting.   Exam: Physical Exam HENT:     Nose: No mucosal edema.     Mouth/Throat:     Pharynx: No oropharyngeal exudate.  Eyes:     General: Lids are normal.     Conjunctiva/sclera: Conjunctivae normal.     Pupils: Pupils are equal, round, and reactive to light.  Cardiovascular:     Rate and Rhythm: Normal rate and regular rhythm.     Heart sounds: Normal heart sounds, S1 normal and S2 normal.  Pulmonary:     Breath sounds: Examination of the right-lower field reveals decreased breath sounds. Examination of the left-lower field reveals decreased breath sounds. Decreased breath sounds present. No wheezing or rhonchi.  Abdominal:     Palpations: Abdomen is soft.     Tenderness: There is no abdominal tenderness.   Musculoskeletal:     Right ankle: No swelling.     Left ankle: No swelling.  Skin:    General: Skin is warm.     Findings: No rash.  Neurological:     Mental Status: She is alert and oriented to person, place, and time.       Data Reviewed: Basic Metabolic Panel: Recent Labs  Lab 08/07/20 0841 08/08/20 0508 08/09/20 0633  NA 136 140 139  K 7.2* 3.9 3.8  CL 103 94* 92*  CO2 20* 36* 35*  GLUCOSE 183* 190* 201*  BUN 59* 26* 22  CREATININE 10.07* 5.37* 3.76*  CALCIUM 7.9* 7.5* 7.8*   Liver Function Tests: Recent Labs  Lab 08/07/20 0841 08/08/20 0508 08/09/20 0633  AST 40 27 33  ALT 19 13 14   ALKPHOS 102 79 68  BILITOT 1.2 0.8 0.8  PROT 7.1 5.9* 5.5*  ALBUMIN 3.9 3.0* 2.8*   CBC: Recent Labs  Lab 08/07/20 0841 08/08/20 0508 08/09/20 0633  WBC 7.3 4.7 5.5  NEUTROABS 6.4 3.5 4.4  HGB 8.9* 8.0* 8.3*  HCT 27.4* 24.2* 25.4*  MCV 98.9 96.0 95.8  PLT 175 166 180    CBG: Recent Labs  Lab 08/08/20 1652 08/08/20 2136 08/09/20 0147 08/09/20 0833 08/09/20 1157  GLUCAP 321* 489* 245* 166* 289*    Recent Results (from the past 240 hour(s))  SARS Coronavirus 2 by RT  PCR (hospital order, performed in [facility] hospital lab) Nasopharyngeal Urine, Catheterized     Status: Abnormal   Collection Time: 08/07/20  8:41 AM   Specimen: Urine, Catheterized; Nasopharyngeal  Result Value Ref Range Status   SARS Coronavirus 2 POSITIVE (A) NEGATIVE Final    Comment: RESULT CALLED TO, READ BACK BY AND VERIFIED WITH: MARY NEEDHAM AT 1019 ON 08/07/2020 [place]. (NOTE) SARS-CoV-2 target nucleic acids are DETECTED  SARS-CoV-2 RNA is generally detectable in upper respiratory specimens  during the acute phase of infection.  Positive results are indicative  of the presence of the identified virus, but do not rule out bacterial infection or co-infection with other pathogens not detected by the test.  Clinical correlation with patient history and  other diagnostic information is  necessary to determine patient infection status.  The expected result is negative.  Fact Sheet for Patients:   StrictlyIdeas.no   Fact Sheet for Healthcare Providers:   BankingDealers.co.za    This test is not yet approved or cleared by the [place] FDA and  has been authorized for detection and/or diagnosis of SARS-CoV-2 by FDA under an Emergency Use Authorization (EUA).  This EUA will remain in effect (meaning thi s test can be used) for the duration of  the COVID-19 declaration under Section 564(b)(1) of the Act, 21 U.S.C. section 360-bbb-3(b)(1), unless the authorization is terminated or revoked sooner.  Performed at [facility], [address] [place], [place] [address]       Scheduled Meds: . amLODipine  10 mg Oral Daily  . aspirin EC  81 mg Oral Daily  . atorvastatin  40 mg Oral q1800  . calcium acetate  2,001 mg Oral TID WC  . Chlorhexidine Gluconate Cloth  6 each Topical Daily  . dexamethasone (DECADRON) injection  6 mg Intravenous Q24H  . epoetin (EPOGEN/PROCRIT) injection  10,000 Units Intravenous Q T,Th,Sa-HD  . furosemide  20 mg Oral Daily  . heparin  5,000 Units Subcutaneous Q8H  . hydrALAZINE  50 mg Oral TID  . insulin aspart  0-6 Units Subcutaneous TID WC  . insulin aspart  2 Units Subcutaneous TID WC  . losartan  50 mg Oral Daily  . pantoprazole  40 mg Oral Daily  . sodium chloride flush  3 mL Intravenous Q12H   Continuous Infusions: . sodium chloride    . remdesivir 100 mg in NS 100 mL 100 mg (08/09/20 0938)  . sodium bicarbonate 150 mEq in dextrose 5% 1000 mL 150 mEq (08/09/20 1428)    Assessment/Plan:  1. Acute respiratory failure secondary to COVID-19 pneumonia.  Try to taper off oxygen get pulse ox on room air tomorrow morning. 2. COVID-19 pneumonia.  Remdesivir day 3 today of 5.  Continue Decadron.  Temperature curve trending better.  Unfortunately no cultures sent on  admission. 3. Type 2 diabetes mellitus with end-stage renal disease.  Hemoglobin A1c good at 6.4.  Dialysis Tuesday Thursday and Saturday.  Case discussed with transitional care team about whether or not they will change dialysis center with Covid diagnosis or not. 4. Essential hypertension on amlodipine, losartan, Lasix and hydralazine. 5. Hyperlipidemia on atorvastatin 6. Weakness.  Physical therapy evaluation  Code Status:     Code Status Orders  (From admission, onward)         Start     Ordered   08/07/20 1206  Full code  Continuous        08/07/20 1210        Code  Status History    Date Active Date Inactive Code Status Order ID Comments User Context   02/24/2018 1247 02/25/2018 1737 Full Code [id number]  Dustin Flock, MD Inpatient   01/26/2018 1533 01/27/2018 2212 Full Code [id number]  Bettey Costa, MD Inpatient   12/24/2017 0131 12/25/2017 2058 Full Code [id number]  Lance Coon, MD Inpatient   01/14/2017 0925 01/17/2017 1803 Full Code [id number]  Laverle Hobby, MD ED   11/08/2016 1808 11/10/2016 1754 Full Code [id number]  Algernon Huxley, MD Inpatient   10/17/2016 1319 10/17/2016 1924 Full Code [id number]  Algernon Huxley, MD Inpatient   04/23/2016 2029 05/07/2016 2001 Full Code [id number]  Vaughan Basta, MD Inpatient   04/15/2016 1918 04/19/2016 1608 Full Code [id number]  Gladstone Lighter, MD Inpatient   Advance Care Planning Activity     Family Communication: Spoke with daughter on the phone Disposition Plan: Status is: Inpatient  Dispo: The patient is from: Home              Anticipated d/c is to: Home              Anticipated d/c date is: 08/11/2020              Patient currently being treated for acute hypoxic respiratory failure second to COVID-19 pneumonia  Consultants:  Nephrology  Time spent: 28 minutes  [place]

## 2020-08-09 NOTE — Progress Notes (Signed)
Inpatient Diabetes Program Recommendations  AACE/ADA: New Consensus Statement on Inpatient Glycemic Control   Target Ranges:  Prepandial:   less than 140 mg/dL      Peak postprandial:   less than 180 mg/dL (1-2 hours)      Critically ill patients:  140 - 180 mg/dL   Results for Anita Contreras, Anita Contreras (MRN [id number]) as of 08/09/2020 11:56  Ref. Range 08/08/2020 07:29 08/08/2020 11:41 08/08/2020 16:52 08/08/2020 21:36 08/09/2020 01:47 08/09/2020 08:33  Glucose-Capillary Latest Ref Range: 70 - 99 mg/dL 182 (H) 225 (H) 321 (H) 489 (H) 245 (H) 166 (H)   Review of Glycemic Control  Outpatient Diabetes medications: 70/30 3 units BID Current orders for Inpatient glycemic control: Novlog 0-6 units TID with meals, Novolog 2 units TID with meals for meal coverage; Decadron 6 mg Q24H  Inpatient Diabetes Program Recommendations:    Insulin-Meal Coverage: If steroids will be continued as ordered, please consider increasing meal coverage to Novolog 5 units TID with meals.  Thanks, Barnie Alderman, RN, MSN, CDE Diabetes Coordinator Inpatient Diabetes Program [phone] (Team Pager from 8am to 5pm)

## 2020-08-09 NOTE — Progress Notes (Signed)
Called patients daughter [name] and gave her an update on her mother.

## 2020-08-09 NOTE — Progress Notes (Signed)
PT Cancellation Note  Patient Details Name: Anita Contreras MRN: [id number] DOB: [birth year]   Cancelled Treatment:    Reason Eval/Treat Not Completed: Patient declined, no reason specified (Consult received and chart reviewed.  Patient declining participation with session, repeatedly declining participation with session.  Unable to reason with or redirect.  Will continue efforts next date.)  Tele-interpreter utilized throughout session to assist with communication.   Andalyn Heckstall H. Owens Shark, PT, DPT, NCS 08/09/20, 5:31 PM [phone]

## 2020-08-10 DIAGNOSIS — U071 COVID-19: Secondary | ICD-10-CM | POA: Diagnosis not present

## 2020-08-10 DIAGNOSIS — R531 Weakness: Secondary | ICD-10-CM

## 2020-08-10 DIAGNOSIS — R112 Nausea with vomiting, unspecified: Secondary | ICD-10-CM | POA: Diagnosis not present

## 2020-08-10 DIAGNOSIS — E1122 Type 2 diabetes mellitus with diabetic chronic kidney disease: Secondary | ICD-10-CM | POA: Diagnosis not present

## 2020-08-10 LAB — CBC WITH DIFFERENTIAL/PLATELET
Abs Immature Granulocytes: 0.06 10*3/uL (ref 0.00–0.07)
Basophils Absolute: 0 10*3/uL (ref 0.0–0.1)
Basophils Relative: 0 %
Eosinophils Absolute: 0 10*3/uL (ref 0.0–0.5)
Eosinophils Relative: 0 %
HCT: 23 % — ABNORMAL LOW (ref 36.0–46.0)
Hemoglobin: 8 g/dL — ABNORMAL LOW (ref 12.0–15.0)
Immature Granulocytes: 1 %
Lymphocytes Relative: 13 %
Lymphs Abs: 0.9 10*3/uL (ref 0.7–4.0)
MCH: 31.9 pg (ref 26.0–34.0)
MCHC: 34.8 g/dL (ref 30.0–36.0)
MCV: 91.6 fL (ref 80.0–100.0)
Monocytes Absolute: 0.3 10*3/uL (ref 0.1–1.0)
Monocytes Relative: 5 %
Neutro Abs: 5.4 10*3/uL (ref 1.7–7.7)
Neutrophils Relative %: 81 %
Platelets: 192 10*3/uL (ref 150–400)
RBC: 2.51 MIL/uL — ABNORMAL LOW (ref 3.87–5.11)
RDW: 14.3 % (ref 11.5–15.5)
WBC: 6.7 10*3/uL (ref 4.0–10.5)
nRBC: 0 % (ref 0.0–0.2)

## 2020-08-10 LAB — COMPREHENSIVE METABOLIC PANEL
ALT: 13 U/L (ref 0–44)
AST: 29 U/L (ref 15–41)
Albumin: 2.3 g/dL — ABNORMAL LOW (ref 3.5–5.0)
Alkaline Phosphatase: 59 U/L (ref 38–126)
Anion gap: 14 (ref 5–15)
BUN: 42 mg/dL — ABNORMAL HIGH (ref 8–23)
CO2: 46 mmol/L — ABNORMAL HIGH (ref 22–32)
Calcium: 6.5 mg/dL — ABNORMAL LOW (ref 8.9–10.3)
Chloride: 76 mmol/L — ABNORMAL LOW (ref 98–111)
Creatinine, Ser: 4.48 mg/dL — ABNORMAL HIGH (ref 0.44–1.00)
GFR calc Af Amer: 11 mL/min — ABNORMAL LOW (ref >60)
GFR calc non Af Amer: 10 mL/min — ABNORMAL LOW (ref >60)
Glucose, Bld: 892 mg/dL (ref 70–99)
Potassium: 3.8 mmol/L (ref 3.5–5.1)
Sodium: 136 mmol/L (ref 135–145)
Total Bilirubin: 0.6 mg/dL (ref 0.3–1.2)
Total Protein: 4.6 g/dL — ABNORMAL LOW (ref 6.5–8.1)

## 2020-08-10 LAB — BASIC METABOLIC PANEL
Anion gap: 15 (ref 5–15)
BUN: 51 mg/dL — ABNORMAL HIGH (ref 8–23)
CO2: 37 mmol/L — ABNORMAL HIGH (ref 22–32)
Calcium: 8 mg/dL — ABNORMAL LOW (ref 8.9–10.3)
Chloride: 84 mmol/L — ABNORMAL LOW (ref 98–111)
Creatinine, Ser: 5.39 mg/dL — ABNORMAL HIGH (ref 0.44–1.00)
GFR calc Af Amer: 9 mL/min — ABNORMAL LOW (ref >60)
GFR calc non Af Amer: 8 mL/min — ABNORMAL LOW (ref >60)
Glucose, Bld: 400 mg/dL — ABNORMAL HIGH (ref 70–99)
Potassium: 4.4 mmol/L (ref 3.5–5.1)
Sodium: 136 mmol/L (ref 135–145)

## 2020-08-10 LAB — GLUCOSE, CAPILLARY
Glucose-Capillary: 420 mg/dL — ABNORMAL HIGH (ref 70–99)
Glucose-Capillary: 374 mg/dL — ABNORMAL HIGH (ref 70–99)
Glucose-Capillary: 435 mg/dL — ABNORMAL HIGH (ref 70–99)
Glucose-Capillary: 141 mg/dL — ABNORMAL HIGH (ref 70–99)
Glucose-Capillary: 101 mg/dL — ABNORMAL HIGH (ref 70–99)

## 2020-08-10 LAB — FERRITIN: Ferritin: 2622 ng/mL — ABNORMAL HIGH (ref 11–307)

## 2020-08-10 LAB — C-REACTIVE PROTEIN: CRP: 5 mg/dL — ABNORMAL HIGH (ref <1.0)

## 2020-08-10 LAB — PROCALCITONIN: Procalcitonin: 1.59 ng/mL

## 2020-08-10 LAB — FIBRIN DERIVATIVES D-DIMER (ARMC ONLY): Fibrin derivatives D-dimer (ARMC): 752 ng/mL (FEU) — ABNORMAL HIGH (ref 0.00–499.00)

## 2020-08-10 MED ORDER — INSULIN DETEMIR 100 UNIT/ML ~~LOC~~ SOLN
5.0000 [IU] | Freq: Every day | SUBCUTANEOUS | Status: DC
  Administered 2020-08-10 – 2020-08-11 (×2): 5 [IU] via SUBCUTANEOUS
  Filled 2020-08-10 (×4): qty 0.05

## 2020-08-10 MED ORDER — INSULIN ASPART 100 UNIT/ML ~~LOC~~ SOLN
14.0000 [IU] | Freq: Once | SUBCUTANEOUS | Status: AC
  Administered 2020-08-10: 14 [IU] via SUBCUTANEOUS
  Filled 2020-08-10: qty 1

## 2020-08-10 MED ORDER — METOCLOPRAMIDE HCL 5 MG/ML IJ SOLN
5.0000 mg | Freq: Four times a day (QID) | INTRAMUSCULAR | Status: DC
  Administered 2020-08-10 – 2020-08-11 (×5): 5 mg via INTRAVENOUS
  Filled 2020-08-10 (×5): qty 2

## 2020-08-10 MED ORDER — PROMETHAZINE HCL 25 MG/ML IJ SOLN
6.2500 mg | Freq: Once | INTRAMUSCULAR | Status: AC
  Administered 2020-08-10: 13:00:00 6.25 mg via INTRAVENOUS
  Filled 2020-08-10: qty 1

## 2020-08-10 MED ORDER — INSULIN ASPART 100 UNIT/ML ~~LOC~~ SOLN
5.0000 [IU] | Freq: Three times a day (TID) | SUBCUTANEOUS | Status: DC
  Administered 2020-08-10 – 2020-08-11 (×4): 5 [IU] via SUBCUTANEOUS
  Filled 2020-08-10 (×4): qty 1

## 2020-08-10 NOTE — Progress Notes (Signed)
Patient ID: Anita Contreras, female   DOB: 1955-12-17, 64 y.o.   MRN: [id number] [facility] PROGRESS NOTE  Anita Contreras [id number]:[id number] DOB: June 17, 1956 DOA: 08/07/2020 PCP: Frazier Richards, MD  HPI/Subjective: I was called by nurse yesterday evening that she has not been eating.  Was called by nursing staff this morning that she was vomiting.  She ate a little bit today.  She was feeling a little bit better.  Does have some swelling in her upper neck and shoulder area which goes down after dialysis.  Not having the joint pain or sweating that she has been having.  Objective: Vitals:   08/10/20 1423 08/10/20 1430  BP:    Pulse: 66 88  Resp:    Temp: 98.6 F (37 C)   SpO2: 98%     Intake/Output Summary (Last 24 hours) at 08/10/2020 1457 Last data filed at 08/10/2020 1128 Gross per 24 hour  Intake 2350.89 ml  Output 3 ml  Net 2347.89 ml   Filed Weights   08/07/20 0752 08/07/20 1230 08/07/20 1324  Weight: (!) 512 kg 55.4 kg 55.4 kg    ROS: Review of Systems  Respiratory: Positive for shortness of breath. Negative for cough.   Cardiovascular: Negative for chest pain.  Gastrointestinal: Positive for nausea and vomiting. Negative for abdominal pain and diarrhea.   Exam: Physical Exam HENT:     Head: Normocephalic.     Nose: No mucosal edema.     Mouth/Throat:     Pharynx: No oropharyngeal exudate.  Eyes:     General: Lids are normal.     Conjunctiva/sclera: Conjunctivae normal.     Pupils: Pupils are equal, round, and reactive to light.  Neck:     Comments: Slight swelling neck into the shoulders. Cardiovascular:     Rate and Rhythm: Normal rate and regular rhythm.     Heart sounds: Normal heart sounds, S1 normal and S2 normal.  Pulmonary:     Breath sounds: Examination of the right-lower field reveals decreased breath sounds. Examination of the left-lower field reveals decreased breath sounds. Decreased breath sounds present. No wheezing, rhonchi or  rales.  Abdominal:     Palpations: Abdomen is soft.     Tenderness: There is no abdominal tenderness.  Musculoskeletal:     Right lower leg: No swelling.     Left lower leg: No swelling.  Skin:    General: Skin is warm.     Findings: No rash.  Neurological:     Mental Status: She is alert and oriented to person, place, and time.       Data Reviewed: Basic Metabolic Panel: Recent Labs  Lab 08/07/20 0841 08/08/20 0508 08/09/20 0633 08/10/20 0517 08/10/20 0800  NA 136 140 139 136 136  K 7.2* 3.9 3.8 3.8 4.4  CL 103 94* 92* 76* 84*  CO2 20* 36* 35* 46* 37*  GLUCOSE 183* 190* 201* 892* 400*  BUN 59* 26* 22 42* 51*  CREATININE 10.07* 5.37* 3.76* 4.48* 5.39*  CALCIUM 7.9* 7.5* 7.8* 6.5* 8.0*   Liver Function Tests: Recent Labs  Lab 08/07/20 0841 08/08/20 0508 08/09/20 0633 08/10/20 0517  AST 40 27 33 29  ALT 19 13 14 13   ALKPHOS 102 79 68 59  BILITOT 1.2 0.8 0.8 0.6  PROT 7.1 5.9* 5.5* 4.6*  ALBUMIN 3.9 3.0* 2.8* 2.3*   CBC: Recent Labs  Lab 08/07/20 0841 08/08/20 0508 08/09/20 0633 08/10/20 0517  WBC 7.3 4.7 5.5 6.7  NEUTROABS 6.4 3.5 4.4  5.4  HGB 8.9* 8.0* 8.3* 8.0*  HCT 27.4* 24.2* 25.4* 23.0*  MCV 98.9 96.0 95.8 91.6  PLT 175 166 180 192    CBG: Recent Labs  Lab 08/09/20 1552 08/09/20 2123 08/10/20 0700 08/10/20 0753 08/10/20 1149  GLUCAP 264* 296* 420* 374* 435*    Recent Results (from the past 240 hour(s))  SARS Coronavirus 2 by RT PCR (hospital order, performed in [facility] hospital lab) Nasopharyngeal Urine, Catheterized     Status: Abnormal   Collection Time: 08/07/20  8:41 AM   Specimen: Urine, Catheterized; Nasopharyngeal  Result Value Ref Range Status   SARS Coronavirus 2 POSITIVE (A) NEGATIVE Final    Comment: RESULT CALLED TO, READ BACK BY AND VERIFIED WITH: MARY NEEDHAM AT 1019 ON 08/07/2020 [place]. (NOTE) SARS-CoV-2 target nucleic acids are DETECTED  SARS-CoV-2 RNA is generally detectable in upper respiratory specimens   during the acute phase of infection.  Positive results are indicative  of the presence of the identified virus, but do not rule out bacterial infection or co-infection with other pathogens not detected by the test.  Clinical correlation with patient history and  other diagnostic information is necessary to determine patient infection status.  The expected result is negative.  Fact Sheet for Patients:   StrictlyIdeas.no   Fact Sheet for Healthcare Providers:   BankingDealers.co.za    This test is not yet approved or cleared by the [place] FDA and  has been authorized for detection and/or diagnosis of SARS-CoV-2 by FDA under an Emergency Use Authorization (EUA).  This EUA will remain in effect (meaning thi s test can be used) for the duration of  the COVID-19 declaration under Section 564(b)(1) of the Act, 21 U.S.C. section 360-bbb-3(b)(1), unless the authorization is terminated or revoked sooner.  Performed at [facility], [address] [place], [place] [address]      Scheduled Meds: . aspirin EC  81 mg Oral Daily  . atorvastatin  40 mg Oral q1800  . calcium acetate  2,001 mg Oral TID WC  . Chlorhexidine Gluconate Cloth  6 each Topical Daily  . dexamethasone (DECADRON) injection  6 mg Intravenous Q24H  . epoetin (EPOGEN/PROCRIT) injection  10,000 Units Intravenous Q T,Th,Sa-HD  . furosemide  20 mg Oral Daily  . heparin  5,000 Units Subcutaneous Q8H  . insulin aspart  0-6 Units Subcutaneous TID WC  . insulin aspart  5 Units Subcutaneous TID WC  . insulin detemir  5 Units Subcutaneous Daily  . losartan  50 mg Oral Daily  . metoCLOPramide (REGLAN) injection  5 mg Intravenous Q6H  . pantoprazole  40 mg Oral Daily  . sodium chloride flush  3 mL Intravenous Q12H   Continuous Infusions: . sodium chloride    . remdesivir 100 mg in NS 100 mL 100 mg (08/10/20 1100)    Assessment/Plan:  1. Acute respiratory  failure secondary to COVID-19 pneumonia.  Patient off oxygen at this point. 2. COVID-19 pneumonia.  Remdesivir day 4 out of 5.  Continue Decadron.  Temperature curve trending better. Unfortunately no blood culture sent on presentation. Since procalcitonin is trending higher we will send blood cultures today. Hold on antibiotic. 3. Nausea vomiting likely secondary to Covid.  Did have to give 1 dose of Phenergan.  Started Reglan. 4. Type 2 diabetes mellitus with end-stage renal disease.  Hemoglobin A1c good at 6.4. Dialysis Tuesday Thursday and Saturday. 5. Essential hypertension on losartan and Lasix. Holding amlodipine and hydralazine 6. Hyperlipidemia unspecified on atorvastatin  7. Weakness physical therapy evaluation     Code Status:     Code Status Orders  (From admission, onward)         Start     Ordered   08/07/20 1206  Full code  Continuous        08/07/20 1210        Code Status History    Date Active Date Inactive Code Status Order ID Comments User Context   02/24/2018 1247 02/25/2018 1737 Full Code [id number]  Dustin Flock, MD Inpatient   01/26/2018 1533 01/27/2018 2212 Full Code [id number]  Bettey Costa, MD Inpatient   12/24/2017 0131 12/25/2017 2058 Full Code [id number]  Lance Coon, MD Inpatient   01/14/2017 0925 01/17/2017 1803 Full Code [id number]  Laverle Hobby, MD ED   11/08/2016 1808 11/10/2016 1754 Full Code [id number]  Algernon Huxley, MD Inpatient   10/17/2016 1319 10/17/2016 1924 Full Code [id number]  Algernon Huxley, MD Inpatient   04/23/2016 2029 05/07/2016 2001 Full Code [id number]  Vaughan Basta, MD Inpatient   04/15/2016 1918 04/19/2016 1608 Full Code [id number]  Gladstone Lighter, MD Inpatient   Advance Care Planning Activity     Family Communication: Spoke with daughter on the phone Disposition Plan: Status is: Inpatient  Dispo: The patient is from: Home              Anticipated d/c is to: Home              Anticipated d/c date is: I will take things day by  day. Tomorrow will be day 5 of remdesivir but need to make sure she is able to tolerate food prior to disposition.              Patient currently being treated for COVID-19 pneumonia.  Time spent: 28 minutes  [place]

## 2020-08-10 NOTE — Progress Notes (Signed)
PT Cancellation Note  Patient Details Name: Temesha Queener MRN: [id number] DOB: 01-Dec-1956   Cancelled Treatment:    Reason Eval/Treat Not Completed: Medical issues which prohibited therapy: Pt's BG 400 falling outside guidelines for participation with PT services.  Will attempt to see pt at a future date/time as medically appropriate.     Blanche [place] Yitzel Shasteen 08/10/2020, 10:07 AM

## 2020-08-10 NOTE — Progress Notes (Signed)
[facility]  ROUNDING NOTE   Subjective:   Hemodialysis for  today  History taken with assistance of Spanish Interpreter.   Objective:  Vital signs in last 24 hours:  Temp:  [98 F (36.7 C)-98.2 F (36.8 C)] 98.1 F (36.7 C) (09/02 1114) Pulse Rate:  [59-66] 65 (09/02 1114) Resp:  [16-22] 18 (09/02 1114) BP: (146-178)/(38-55) 157/50 (09/02 1114) SpO2:  [81 %-99 %] 96 % (09/02 1114)  Weight change:  Filed Weights   08/07/20 0752 08/07/20 1230 08/07/20 1324  Weight: (!) 512 kg 55.4 kg 55.4 kg    Intake/Output: I/O last 3 completed shifts: In: 303 [P.O.:120; I.V.:183] Out: 1002 [Urine:1; Other:1000; Stool:1]   Intake/Output this shift:  Total I/O In: 2350.9 [I.V.:2142; IV Piggyback:208.9] Out: 1 [Emesis/NG output:1]  Physical Exam: General: No acute distress  Head: Oral mucosal membranes moist  Eyes: PERRL   Neck: Supple, trachea at the midline  Lungs:  Clear, 2L [place] O2  Heart: regular  Abdomen:  Non distended, non tender  Extremities: No peripheral edema.  Neurologic: Alert, oriented   Skin:  no lesions  Access: Right AVF    Basic Metabolic Panel: Recent Labs  Lab 08/07/20 0841 08/07/20 0841 08/08/20 0508 08/08/20 0508 08/09/20 0633 08/10/20 0517 08/10/20 0800  NA 136  --  140  --  139 136 136  K 7.2*  --  3.9  --  3.8 3.8 4.4  CL 103  --  94*  --  92* 76* 84*  CO2 20*  --  36*  --  35* 46* 37*  GLUCOSE 183*  --  190*  --  201* 892* 400*  BUN 59*  --  26*  --  22 42* 51*  CREATININE 10.07*  --  5.37*  --  3.76* 4.48* 5.39*  CALCIUM 7.9*   < > 7.5*   < > 7.8* 6.5* 8.0*   < > = values in this interval not displayed.    Liver Function Tests: Recent Labs  Lab 08/07/20 0841 08/08/20 0508 08/09/20 0633 08/10/20 0517  AST 40 27 33 29  ALT 19 13 14 13   ALKPHOS 102 79 68 59  BILITOT 1.2 0.8 0.8 0.6  PROT 7.1 5.9* 5.5* 4.6*  ALBUMIN 3.9 3.0* 2.8* 2.3*   No results for input(s): LIPASE, AMYLASE in the last 168 hours. No results for  input(s): AMMONIA in the last 168 hours.  CBC: Recent Labs  Lab 08/07/20 0841 08/08/20 0508 08/09/20 0633 08/10/20 0517  WBC 7.3 4.7 5.5 6.7  NEUTROABS 6.4 3.5 4.4 5.4  HGB 8.9* 8.0* 8.3* 8.0*  HCT 27.4* 24.2* 25.4* 23.0*  MCV 98.9 96.0 95.8 91.6  PLT 175 166 180 192    Cardiac Enzymes: No results for input(s): CKTOTAL, CKMB, CKMBINDEX, TROPONINI in the last 168 hours.  BNP: Invalid input(s): POCBNP  CBG: Recent Labs  Lab 08/09/20 1552 08/09/20 2123 08/10/20 0700 08/10/20 0753 08/10/20 1149  GLUCAP 264* 296* 420* 374* 435*    Microbiology: Results for orders placed or performed during the hospital encounter of 08/07/20  SARS Coronavirus 2 by RT PCR (hospital order, performed in [facility] hospital lab) Nasopharyngeal Urine, Catheterized     Status: Abnormal   Collection Time: 08/07/20  8:41 AM   Specimen: Urine, Catheterized; Nasopharyngeal  Result Value Ref Range Status   SARS Coronavirus 2 POSITIVE (A) NEGATIVE Final    Comment: RESULT CALLED TO, READ BACK BY AND VERIFIED WITH: Anita Contreras AT 1019 ON 08/07/2020 [place]. (NOTE) SARS-CoV-2  target nucleic acids are DETECTED  SARS-CoV-2 RNA is generally detectable in upper respiratory specimens  during the acute phase of infection.  Positive results are indicative  of the presence of the identified virus, but do not rule out bacterial infection or co-infection with other pathogens not detected by the test.  Clinical correlation with patient history and  other diagnostic information is necessary to determine patient infection status.  The expected result is negative.  Fact Sheet for Patients:   StrictlyIdeas.no   Fact Sheet for Healthcare Providers:   BankingDealers.co.za    This test is not yet approved or cleared by the [place] FDA and  has been authorized for detection and/or diagnosis of SARS-CoV-2 by FDA under an Emergency Use Authorization (EUA).  This  EUA will remain in effect (meaning thi s test can be used) for the duration of  the COVID-19 declaration under Section 564(b)(1) of the Act, 21 U.S.C. section 360-bbb-3(b)(1), unless the authorization is terminated or revoked sooner.  Performed at [facility], [address] [place], [place] [address]     Coagulation Studies: No results for input(s): LABPROT, INR in the last 72 hours.  Urinalysis: No results for input(s): COLORURINE, LABSPEC, PHURINE, GLUCOSEU, HGBUR, BILIRUBINUR, KETONESUR, PROTEINUR, UROBILINOGEN, NITRITE, LEUKOCYTESUR in the last 72 hours.  Invalid input(s): APPERANCEUR    Imaging: No results found.   Medications:   . sodium chloride    . remdesivir 100 mg in NS 100 mL 100 mg (08/10/20 1100)  . sodium bicarbonate 150 mEq in dextrose 5% 1000 mL Stopped (08/10/20 1107)   . aspirin EC  81 mg Oral Daily  . atorvastatin  40 mg Oral q1800  . calcium acetate  2,001 mg Oral TID WC  . Chlorhexidine Gluconate Cloth  6 each Topical Daily  . dexamethasone (DECADRON) injection  6 mg Intravenous Q24H  . epoetin (EPOGEN/PROCRIT) injection  10,000 Units Intravenous Q T,Th,Sa-HD  . furosemide  20 mg Oral Daily  . heparin  5,000 Units Subcutaneous Q8H  . insulin aspart  0-6 Units Subcutaneous TID WC  . insulin aspart  5 Units Subcutaneous TID WC  . insulin detemir  5 Units Subcutaneous Daily  . losartan  50 mg Oral Daily  . metoCLOPramide (REGLAN) injection  5 mg Intravenous Q6H  . pantoprazole  40 mg Oral Daily  . sodium chloride flush  3 mL Intravenous Q12H   sodium chloride, acetaminophen, alum & mag hydroxide-simeth, hydrOXYzine, loperamide, methadone, ondansetron **OR** ondansetron (ZOFRAN) IV, polyethylene glycol, sodium chloride flush  Assessment/ Plan:  [name] is a 64 y.o. Hispanic Spanish Speaking only female with end stage renal disease on hemodialysis, insulin-dependent diabetes, diabetic retinopathy, hypertension,  hyperlipidemia, history of hemorrhagic cystitis, migraine headaches, osteoarthritis, osteoporosis who is admitted to [facility] on Hyperkalemia [E87.5] Lymphopenia [D72.810] Hypoxia [R09.02] Hypoxic [R09.02] ESRD (end stage renal disease) ([place]) [N18.6] Fever in other diseases [R50.81] COVID-19 [U07.1]   [facility] [place] right upper arm AV fistula 51.5kg  1. End Stage Renal Disease with hyperkalemia: emergent dialysis on admission.  Continue TTS schedule. Dialysis for today. Orders prepared. 3.5 hour treatment. UF goal of 2 liters - discontinue sodium bicarbonate infusion.   2. Hypertension:  Home regimen of amlodipine, hydralazine, losartan  3. Anemia of chronic kidney disease: hemoglobin 8 Receiving Epogen with dialysis  Mircera as outpatient. .   4. Secondary Hyperparathyroidism:  - calcium acetate with meals.    LOS: 3 Anita Contreras 9/2/202112:46 PM

## 2020-08-10 NOTE — Progress Notes (Signed)
Inpatient Diabetes Program Recommendations  AACE/ADA: New Consensus Statement on Inpatient Glycemic Control   Target Ranges:  Prepandial:   less than 140 mg/dL      Peak postprandial:   less than 180 mg/dL (1-2 hours)      Critically ill patients:  140 - 180 mg/dL   Results for Anita Contreras, Anita Contreras (MRN [id number]) as of 08/10/2020 08:16  Ref. Range 08/09/2020 08:33 08/09/2020 11:57 08/09/2020 15:52 08/09/2020 21:23 08/10/2020 07:00 08/10/2020 07:53  Glucose-Capillary Latest Ref Range: 70 - 99 mg/dL 166 (H) 289 (H) 264 (H) 296 (H) 420 (H) 374 (H)   Review of Glycemic Control  Outpatient Diabetes medications:70/30 3 units BID Current orders for Inpatient glycemic control:Novlog 0-6 units TID with meals, Novolog 5 units TID with meals for meal coverage; Decadron 6 mg Q24H  Inpatient Diabetes Program Recommendations:    Insulin:  If steroids will be continued as ordered, please consider ordering Levemir 5 units Q24H.  Thanks, Barnie Alderman, RN, MSN, CDE Diabetes Coordinator Inpatient Diabetes Program [phone] (Team Pager from 8am to 5pm)

## 2020-08-10 NOTE — Progress Notes (Signed)
Hydralazine 50mg  po is being held due to low diastolic BP. MD notified

## 2020-08-11 DIAGNOSIS — J9600 Acute respiratory failure, unspecified whether with hypoxia or hypercapnia: Secondary | ICD-10-CM

## 2020-08-11 DIAGNOSIS — R112 Nausea with vomiting, unspecified: Secondary | ICD-10-CM | POA: Diagnosis not present

## 2020-08-11 DIAGNOSIS — E1122 Type 2 diabetes mellitus with diabetic chronic kidney disease: Secondary | ICD-10-CM | POA: Diagnosis not present

## 2020-08-11 DIAGNOSIS — U071 COVID-19: Secondary | ICD-10-CM | POA: Diagnosis not present

## 2020-08-11 DIAGNOSIS — I10 Essential (primary) hypertension: Secondary | ICD-10-CM | POA: Diagnosis not present

## 2020-08-11 DIAGNOSIS — N186 End stage renal disease: Secondary | ICD-10-CM

## 2020-08-11 DIAGNOSIS — E785 Hyperlipidemia, unspecified: Secondary | ICD-10-CM

## 2020-08-11 DIAGNOSIS — J1282 Pneumonia due to coronavirus disease 2019: Secondary | ICD-10-CM

## 2020-08-11 LAB — CBC WITH DIFFERENTIAL/PLATELET
Abs Immature Granulocytes: 0.24 10*3/uL — ABNORMAL HIGH (ref 0.00–0.07)
Basophils Absolute: 0 10*3/uL (ref 0.0–0.1)
Basophils Relative: 0 %
Eosinophils Absolute: 0 10*3/uL (ref 0.0–0.5)
Eosinophils Relative: 0 %
HCT: 27.6 % — ABNORMAL LOW (ref 36.0–46.0)
Hemoglobin: 9.3 g/dL — ABNORMAL LOW (ref 12.0–15.0)
Immature Granulocytes: 2 %
Lymphocytes Relative: 16 %
Lymphs Abs: 1.6 10*3/uL (ref 0.7–4.0)
MCH: 31.4 pg (ref 26.0–34.0)
MCHC: 33.7 g/dL (ref 30.0–36.0)
MCV: 93.2 fL (ref 80.0–100.0)
Monocytes Absolute: 0.7 10*3/uL (ref 0.1–1.0)
Monocytes Relative: 6 %
Neutro Abs: 8 10*3/uL — ABNORMAL HIGH (ref 1.7–7.7)
Neutrophils Relative %: 76 %
Platelets: 277 10*3/uL (ref 150–400)
RBC: 2.96 MIL/uL — ABNORMAL LOW (ref 3.87–5.11)
RDW: 14.4 % (ref 11.5–15.5)
WBC: 10.5 10*3/uL (ref 4.0–10.5)
nRBC: 1.8 % — ABNORMAL HIGH (ref 0.0–0.2)

## 2020-08-11 LAB — COMPREHENSIVE METABOLIC PANEL
ALT: 18 U/L (ref 0–44)
AST: 41 U/L (ref 15–41)
Albumin: 2.8 g/dL — ABNORMAL LOW (ref 3.5–5.0)
Alkaline Phosphatase: 78 U/L (ref 38–126)
Anion gap: 10 (ref 5–15)
BUN: 30 mg/dL — ABNORMAL HIGH (ref 8–23)
CO2: 34 mmol/L — ABNORMAL HIGH (ref 22–32)
Calcium: 7.9 mg/dL — ABNORMAL LOW (ref 8.9–10.3)
Chloride: 94 mmol/L — ABNORMAL LOW (ref 98–111)
Creatinine, Ser: 3.38 mg/dL — ABNORMAL HIGH (ref 0.44–1.00)
GFR calc Af Amer: 16 mL/min — ABNORMAL LOW (ref >60)
GFR calc non Af Amer: 14 mL/min — ABNORMAL LOW (ref >60)
Glucose, Bld: 128 mg/dL — ABNORMAL HIGH (ref 70–99)
Potassium: 4.2 mmol/L (ref 3.5–5.1)
Sodium: 138 mmol/L (ref 135–145)
Total Bilirubin: 0.7 mg/dL (ref 0.3–1.2)
Total Protein: 5.6 g/dL — ABNORMAL LOW (ref 6.5–8.1)

## 2020-08-11 LAB — PROCALCITONIN: Procalcitonin: 1.78 ng/mL

## 2020-08-11 LAB — GLUCOSE, CAPILLARY
Glucose-Capillary: 136 mg/dL — ABNORMAL HIGH (ref 70–99)
Glucose-Capillary: 172 mg/dL — ABNORMAL HIGH (ref 70–99)
Glucose-Capillary: 116 mg/dL — ABNORMAL HIGH (ref 70–99)

## 2020-08-11 LAB — C-REACTIVE PROTEIN: CRP: 4.1 mg/dL — ABNORMAL HIGH (ref <1.0)

## 2020-08-11 LAB — FIBRIN DERIVATIVES D-DIMER (ARMC ONLY): Fibrin derivatives D-dimer (ARMC): 655.64 ng/mL (FEU) — ABNORMAL HIGH (ref 0.00–499.00)

## 2020-08-11 LAB — FERRITIN: Ferritin: 3544 ng/mL — ABNORMAL HIGH (ref 11–307)

## 2020-08-11 MED ORDER — AZITHROMYCIN 500 MG PO TABS: 250.0000 mg | ORAL_TABLET | Freq: Every day | ORAL | Status: DC

## 2020-08-11 MED ORDER — AMLODIPINE BESYLATE 5 MG PO TABS
5.0000 mg | ORAL_TABLET | Freq: Every day | ORAL | Status: DC
  Administered 2020-08-11: 5 mg via ORAL
  Filled 2020-08-11: qty 1

## 2020-08-11 MED ORDER — PANTOPRAZOLE SODIUM 40 MG PO TBEC
40.0000 mg | DELAYED_RELEASE_TABLET | Freq: Every day | ORAL | 0 refills | Status: DC
Start: 2020-08-12 — End: 2023-08-29

## 2020-08-11 MED ORDER — ONDANSETRON HCL 4 MG PO TABS: 4.0000 mg | ORAL_TABLET | Freq: Four times a day (QID) | ORAL | 0 refills | Status: DC | PRN

## 2020-08-11 MED ORDER — AZITHROMYCIN 500 MG PO TABS
500.0000 mg | ORAL_TABLET | Freq: Every day | ORAL | Status: AC
  Administered 2020-08-11: 500 mg via ORAL
  Filled 2020-08-11: qty 1

## 2020-08-11 MED ORDER — AZITHROMYCIN 250 MG PO TABS
250.0000 mg | ORAL_TABLET | Freq: Every day | ORAL | 0 refills | Status: DC
Start: 2020-08-12 — End: 2021-02-11

## 2020-08-11 MED ORDER — DEXAMETHASONE 2 MG PO TABS: ORAL_TABLET | ORAL | 0 refills | Status: DC

## 2020-08-11 NOTE — Progress Notes (Signed)
Out via w/c at this time  For discharge home.

## 2020-08-11 NOTE — Discharge Instructions (Signed)
COVID-19: Cmo protegerse y proteger a los dems COVID-19: How to Protect Yourself and Others Sepa cmo se propaga  Actualmente, no existe ninguna vacuna para prevenir la enfermedad por coronavirus 2019 (COVID-19).  La mejor forma de prevenir la enfermedad es evitar exponerse a este virus.  Se cree que el virus se transmite principalmente de [place] persona a [place]. ? [employer] que estn en contacto directo entre s (a una distancia inferior a 6 pies [1.87m]). ? A travs de las gotitas respiratorias producidas cuando una persona infectada tose, estornuda o habla. ? Estas gotitas pueden caer en la boca o en la nariz de las personas que estn cerca o pueden ser [employer] pulmones. ? El COVID-19 puede ser transmitido por personas que no presentan sntomas. Lo que todos deben hacer [employer] las manos con frecuencia  Lvese las manos con frecuencia con agua y jabn durante al menos 20 segundos, especialmente despus de Investment banker, corporate en un lugar pblico o despus de sonarse la nariz, toser o estornudar.  Si no dispone de [place] y [place], use un desinfectante de manos que contenga al menos un 60% de alcohol. [place] y frtelas hasta que se sientan secas.  No se toque los ojos, la nariz y la boca sin antes lavarse las manos. Evite el contacto cercano  Limite el contacto directo con otras personas tanto como sea posible.  Evite el contacto cercano con personas que estn enfermas.  Establezca distancia entre usted y [employer]. ? Recuerde que [employer] no tienen sntomas pueden transmitir el virus. ? Esto es Scientist, research (life sciences) para las personas que tienen ms riesgo de [email or web address] Cbrase la boca y la nariz con una mascarilla cuando est cerca de otras personas  Puede transmitir el COVID-19 a otras personas aunque no se sienta enfermo.  Todas las  Proofreader en lugares pblicos y cuando estn con otras que no vivan en su casa, especialmente cuando el distanciamiento social sea difcil de [place]. ? Las mascarillas no deben colocarse a nios menores de 2 aos de [place], a las personas que tienen problemas respiratorios o que estn inconscientes, incapacitadas o que por algn motivo no puedan quitarse la mascarilla sin [place].  El propsito de Quarry manager es proteger a Producer, television/film/video en caso de que usted est infectado.  NO utilice las [place] [employer] a los trabajadores de [employer]	[place].  Contine manteniendo una distancia aproximada de 6 pies (1.14m) entre usted y [employer]. La mascarilla no reemplaza el distanciamiento social. Cbrase al toser y estornudar  Al toser o al estornudar, siempre cbrase la boca y la nariz con un pauelo descartable o use el interior del codo.  Deseche los pauelos descartables usados en la basura.  Inmediatamente, lvese las manos con agua y [place] durante al menos 20segundos. Si no dispone de agua y [place], [employer] con un desinfectante de manos que contenga al menos un 60% de alcohol. [place] y desinfecte  [place]pie Y desinfecte las superficies que se tocan con frecuencia todos los das. Esto incluye mesas, picaportes, interruptores de luz, encimeras, mangos, escritorios, telfonos, teclados, inodoros, grifos y lavabos. [email or web address]  Si las superficies estn sucias, lmpielas: Use detergente o jabn y agua antes de la desinfeccin.  Luego, use un desinfectante domstico. Physicist, medical de los desinfectantes domsticos registrados en Conservation officer, historic buildings (EPA) (Agencia de Proteccin Ambiental) aqu. [email or web address] 08/11/2019 Esta informacin no tiene como fin reemplazar el consejo del mdico.  Asegrese de hacerle al mdico cualquier pregunta que tenga. Document Revised:  08/25/2019 Document Reviewed: 06/18/2019 Elsevier Patient Education  [place].   COVID-19 COVID-19 El COVID-19 es una infeccin respiratoria causada por un virus llamado coronavirus tipo 2 causante del sndrome respiratorio agudo grave (SARS-CoV-2). La enfermedad tambin se conoce como enfermedad por coronavirus o nuevo coronavirus. En algunas personas, el virus puede no ocasionar sntomas. En otras, puede producir una infeccin grave. La infeccin puede empeorar rpidamente y causar complicaciones, como:  Neumona o infeccin en los pulmones.  Sndrome de dificultad respiratoria aguda o SDRA. Es una afeccin que se caracteriza por la acumulacin de lquido en los pulmones, que impide que los pulmones se llenen de aire y pasen oxgeno a Herbalist.  Insuficiencia respiratoria aguda. Es una afeccin que se caracteriza porque no pasa suficiente oxgeno de los pulmones al cuerpo o porque el dixido de carbono no pasa de los pulmones hacia afuera del cuerpo.  Sepsis o choque sptico. Se trata de una reaccin grave del cuerpo ante una infeccin.  Problemas de coagulacin.  Infecciones secundarias debido a bacterias u hongos.  Falla de rganos. Ocurre cuando los rganos del cuerpo dejan de funcionar. El virus que causa el COVID-19 es contagioso. Esto significa que puede transmitirse de [place] persona a otra a travs de las gotitas de saliva de la tos y de los estornudos (secreciones respiratorias). Cules son las causas? Esta enfermedad es causada por un virus. Usted puede contagiarse con este virus:  Al inspirar las gotitas de una persona infectada. Las [employer] pueden diseminarse cuando una persona respira, habla, canta, tose o estornuda.  Al tocar algo, como una mesa o el picaportes de [place], que estuvo expuesto al virus (contaminado) y luego tocarse la boca, nariz o los ojos. Qu incrementa el riesgo? Riesgo de infeccin Es ms probable que se infecte con este virus si:  Se  encuentra a Physiological scientist a 6 pies (2 metros) de Arts administrator con COVID-19.  Cuida o vive con una persona infectada con COVID-19.  Pasa tiempo en espacios interiores repletos de gente o vive en viviendas compartidas. Riesgo de enfermedad grave Es ms probable que se enferme gravemente por el virus si:  Tiene 50aos o ms. Cuanto mayor sea su edad, mayor ser el riesgo de tener una enfermedad grave.  Vive en un hogar de ancianos o centro de atencin a Barrister's clerk.  Tiene cncer.  Tiene una enfermedad prolongada (crnica), como las siguientes: ? Enfermedad pulmonar crnica, que incluye la enfermedad pulmonar obstructiva crnica o asma. ? Una enfermedad crnica que disminuye la capacidad del cuerpo para combatir las infecciones (immunocomprometido). ? Enfermedad cardaca, que incluye insuficiencia cardaca, una afeccin que se caracteriza porque las arterias que llegan al corazn se Investment banker, corporate u obstruyen (arteriopata coronaria) o una enfermedad que provoca que el msculo cardaco se engrose, se debilite o endurezca (miocardiopata). ? Diabetes. ? Enfermedad renal crnica. ? Anemia drepanoctica, una enfermedad que se caracteriza porque los glbulos rojos tienen una forma anormal de "hoz". ? Enfermedad heptica.  Es obeso. Cules son los signos o sntomas? Los sntomas de esta afeccin pueden ser de leves a graves. Los sntomas pueden aparecer en el trmino de 2 a [address] despus de haber estado expuesto al virus. Incluyen los siguientes:  Fiebre o escalofros.  Tos.  Dificultad para respirar.  Dolores de [place], dolores en el cuerpo o dolores musculares.  Secrecin o congestin nasal.  Dolor de garganta.  Nueva prdida del sentido del gusto  o del olfato. Algunas personas tambin pueden [employer], como nuseas, vmitos o diarrea. Es posible que otras personas no tengan sntomas de COVID-19. Cmo se diagnostica? Esta afeccin se puede diagnosticar en  funcin de lo siguiente:  Sus signos y sntomas, especialmente si: ? Vive en una zona donde hay un brote de COVID-19. ? Viaj recientemente a una zona donde el virus es frecuente. ? Cuida o vive con Ardelia Mems persona a quien se le diagnostic COVID-19. ? [address] expuesto a una persona a la que se le diagnostic COVID-19.  Un examen fsico.  Anlisis de laboratorio que pueden incluir: ? Tomar una muestra de lquido de la parte posterior de la nariz y la garganta (lquido nasofarngeo), la nariz o la garganta, con un hisopo. ? Una muestra de mucosidad de los pulmones (esputo). ? Anlisis de [place].  Los estudios de diagnstico por imgenes pueden incluir radiografas, exploracin por tomografa computarizada (TC) o ecografa. Cmo se trata? En este momento, no hay ningn medicamento para tratar el COVID-19. Los medicamentos para tratar otras enfermedades se usan a modo de ensayo para comprobar si son eficaces contra el COVID-19. El mdico le informar sobre las maneras de tratar los sntomas. En la [employer], la infeccin es leve y puede controlarse en el hogar con reposo, lquidos y medicamentos de [place]. El tratamiento para una infeccin grave suele realizarse en la unidad de cuidados intensivos (UCI) de un hospital. Puede incluir uno o ms de los siguientes. Estos tratamientos se administran hasta que los sntomas mejoran.  Recibir lquidos y [employer] a travs de una va intravenosa.  Oxgeno complementario. Para administrar oxgeno extra, se [place] un tubo en la [place], una mascarilla o una campana de oxgeno.  Colocarlo para que se recueste boca abajo (decbito prono). Esto facilita el ingreso de oxgeno a los pulmones.  Uso continuo de [place] de presin positiva de las vas areas (CPAP) o de presin positiva de las vas areas de dos niveles (BPAP). Este tratamiento utiliza una presin de aire leve para Theatre manager las vas respiratorias abiertas. Un tubo conectado  a un motor administra oxgeno al cuerpo.  Respirador. Este tratamiento mueve el aire dentro y fuera de los pulmones mediante el uso de un tubo que se coloca en la trquea.  Traqueostoma. En este procedimiento se hace un orificio en el cuello para insertar un tubo de respiracin.  Oxigenacin por membrana extracorprea (OMEC). En este procedimiento, los pulmones tienen la posibilidad de recuperarse al asumir las funciones del corazn y los pulmones. Suministra oxgeno al cuerpo y elimina el dixido de carbono. Siga estas instrucciones en su casa: Estilo de vida  Si est enfermo, qudese en su casa, excepto para obtener atencin mdica. El mdico le indicar cunto tiempo debe quedarse en casa. Llame al mdico antes de buscar atencin mdica.  Haga reposo en su casa como se lo haya indicado el mdico.  No consuma ningn producto que contenga nicotina o tabaco, como cigarrillos, cigarrillos electrnicos y tabaco de Higher education careers adviser. Si necesita ayuda para dejar de fumar, consulte al mdico.  Retome sus actividades normales segn lo indicado por el mdico. Pregntele al mdico qu actividades son seguras para usted. Instrucciones generales  Use los medicamentos de venta libre y los recetados solamente como se lo haya indicado el mdico.  Beba suficiente lquido como para Theatre manager la orina de color amarillo plido.  Concurra a todas las visitas de seguimiento como se lo haya indicado el mdico. Esto es importante. Cmo se evita?  No hay ninguna vacuna que ayude a prevenir la infeccin por COVID-19. Sin embargo, hay medidas que puede tomar para protegerse y Museum/gallery curator a Producer, television/film/video de este virus. Para protegerse:   No viaje a zonas donde el COVID-19 sea un riesgo. Las zonas donde se informa la presencia del COVID-19 cambian con frecuencia. Para identificar las zonas de alto riesgo y las restricciones de viaje, consulte el sitio web de viajes de Garment/textile technologist for [employer] and Prevention Librarian, academic)  (Centros para el Control y la Prevencin de Arboriculturist): FatFares.com.br  Si vive o debe viajar a una zona donde el COVID-19 es un riesgo, tome precauciones para evitar infecciones. ? Aljese de [place]. ? Lvese las manos frecuentemente con agua y [place]. Use desinfectante para manos con alcohol si no dispone de [place] y [place]. ? Evite tocarse la boca, la cara, los ojos o la [place]. ? Evite salir de su casa, siga las indicaciones de su estado y [place] autoridades sanitarias locales. ? Si debe salir de su casa, use un barbijo de tela o una mascarilla facial. Asegrese de que le cubra la nariz y la boca. ? Evite los espacios interiores repletos de gente. Mantenga una distancia de al menos 6 pies (2 metros) de Producer, television/film/video. ? Desinfecte los objetos y las superficies que se tocan con frecuencia todos [place]. Pueden incluir:  Encimeras y [place].  Picaportes e interruptores de luz.  Lavabos, fregaderos y grifos.  Aparatos electrnicos tales como telfonos, controles remotos, teclados, computadoras y tabletas. Cmo proteger a los dems: Si tiene sntomas de COVID-19, tome medidas para evitar que el virus se propague a Producer, television/film/video.  Si cree que tiene una infeccin por COVID-19, comunquese de inmediato con su mdico. Informe al equipo de atencin mdica que cree que puede tener una infeccin por el COVID-19.  Qudese en su casa. Salga de su casa solo para buscar atencin mdica. No utilice el transporte pblico.  No viaje mientras est enfermo.  Lvese las manos frecuentemente con agua y [place]. Usar desinfectante para manos con alcohol si no dispone de [place] y [place].  Mantngase alejado de quienes vivan con usted. Permita que los miembros de la familia sanos cuiden a los nios y las [place], si es posible. Si tiene que cuidar a los nios o las mascotas, lvese las manos con frecuencia y use un barbijo. Si es posible,  permanezca en su habitacin, separado de los dems. Utilice un bao diferente.  Asegrese de que todas las personas que viven en su casa se laven bien las manos y con frecuencia.  Tosa o estornude en un pauelo de papel o sobre su manga o codo. No tosa o estornude al aire ni se cubra la boca o la nariz con la [place].  Use un barbijo de tela o una mascarilla facial. Asegrese de que le cubra la nariz y la boca. Dnde buscar ms informacin  Centers for Disease Control and Prevention (Centros para el Control y la Prevencin de Arboriculturist): PurpleGadgets.be  World Health Organization (Organizacin Mundial de la Salud): https://www.castaneda.info/ Comunquese con un mdico si:  Vive o ha viajado a una zona donde el COVID-19 es un riesgo y tiene sntomas de infeccin.  Ha tenido contacto con alguien que tiene COVID-19 y usted tiene sntomas de infeccin. Solicite ayuda inmediatamente si:  Tiene dificultad para respirar.  Siente dolor u opresin en el pecho.  Experimenta confusin.  Aleneva uas de los dedos y los labios de  color azulado.  Tiene dificultad para despertarse.  Los sntomas empeoran. Estos sntomas pueden representar un problema grave que constituye Engineer, maintenance (IT). No espere a ver si los sntomas desaparecen. Solicite atencin mdica de inmediato. Comunquese con el servicio de emergencias de su localidad (911 en los [place]). No conduzca por sus propios medios [employer] hospital. Informe al personal mdico de emergencias si cree que tiene COVID-19. Resumen  El COVID-19 es una infeccin respiratoria causada por un virus. Tambin se conoce como enfermedad por coronavirus o nuevo coronavirus. Puede causar infecciones graves, como neumona, sndrome de dificultad respiratoria aguda, insuficiencia respiratoria aguda o sepsis.  El virus que causa el COVID-19 es contagioso. Esto significa que puede transmitirse de [place] persona a otra a  travs de las gotitas que se despiden al respirar, Electrical engineer, cantar, toser y Brewing technologist.  Es ms probable que desarrolle una enfermedad grave si tiene 58 aos o ms, tiene el sistema inmunitario dbil, vive en un hogar de ancianos o tiene una enfermedad crnica.  No hay ningn medicamento para tratar el COVID-19. El mdico le informar sobre las maneras de tratar los sntomas.  Tome medidas para protegerse y Museum/gallery curator a los Location manager las infecciones. Lvese las manos con frecuencia y desinfecte los objetos y las superficies que se tocan con frecuencia todos [place]. Mantngase alejado de las personas que estn enfermas y use un barbijo si est enfermo. Esta informacin no tiene Marine scientist el consejo del mdico. Asegrese de hacerle al mdico cualquier pregunta que tenga. Document Revised: 09/30/2019 Document Reviewed: 01/23/2019 Elsevier Patient Education  [place].

## 2020-08-11 NOTE — Plan of Care (Signed)
  Problem: Education: Goal: Knowledge of General Education information will improve Description: Including pain rating scale, medication(s)/side effects and non-pharmacologic comfort measures Outcome: Progressing   Problem: Health Behavior/Discharge Planning: Goal: Ability to manage health-related needs will improve Outcome: Progressing   Problem: Clinical Measurements: Goal: Ability to maintain clinical measurements within normal limits will improve Outcome: Progressing Goal: Will remain free from infection Outcome: Progressing Goal: Diagnostic test results will improve Outcome: Progressing Goal: Respiratory complications will improve Outcome: Progressing Goal: Cardiovascular complication will be avoided Outcome: Progressing   Problem: Activity: Goal: Risk for activity intolerance will decrease Outcome: Progressing   Problem: Nutrition: Goal: Adequate nutrition will be maintained Outcome: Progressing   Problem: Coping: Goal: Level of anxiety will decrease Outcome: Progressing   Problem: Elimination: Goal: Will not experience complications related to bowel motility Outcome: Progressing Goal: Will not experience complications related to urinary retention Outcome: Progressing   Problem: Pain Managment: Goal: General experience of comfort will improve Outcome: Progressing   Problem: Safety: Goal: Ability to remain free from injury will improve Outcome: Progressing   Problem: Skin Integrity: Goal: Risk for impaired skin integrity will decrease Outcome: Progressing   Problem: Education: Goal: Knowledge of General Education information will improve Description: Including pain rating scale, medication(s)/side effects and non-pharmacologic comfort measures Outcome: Progressing   Problem: Health Behavior/Discharge Planning: Goal: Ability to manage health-related needs will improve Outcome: Progressing   Problem: Clinical Measurements: Goal: Ability to maintain  clinical measurements within normal limits will improve Outcome: Progressing   Problem: Clinical Measurements: Goal: Will remain free from infection Outcome: Progressing   Problem: Clinical Measurements: Goal: Diagnostic test results will improve Outcome: Progressing   Problem: Clinical Measurements: Goal: Respiratory complications will improve Outcome: Progressing   Problem: Clinical Measurements: Goal: Cardiovascular complication will be avoided Outcome: Progressing   Problem: Nutrition: Goal: Adequate nutrition will be maintained Outcome: Progressing   Problem: Coping: Goal: Level of anxiety will decrease Outcome: Progressing   Problem: Elimination: Goal: Will not experience complications related to bowel motility Outcome: Progressing   Problem: Elimination: Goal: Will not experience complications related to urinary retention Outcome: Progressing   Problem: Pain Managment: Goal: General experience of comfort will improve Outcome: Progressing

## 2020-08-11 NOTE — Progress Notes (Signed)
[facility]  ROUNDING NOTE   Subjective:   Patient resting in bed, in no acute distress, communicated via interpreter service. Patient denies SOB,nausea and vomiting at this time, has c/o pain in her Rt.thighs, advised to use cold compresses.  Objective:  Vital signs in last 24 hours:  Temp:  [97.8 F (36.6 C)-98.6 F (37 C)] 98.2 F (36.8 C) (09/03 1200) Pulse Rate:  [58-88] 64 (09/03 1200) Resp:  [15-18] 18 (09/03 1200) BP: (115-182)/(30-63) 143/50 (09/03 1200) SpO2:  [89 %-99 %] 93 % (09/03 1200)  Weight change:  Filed Weights   08/07/20 0752 08/07/20 1230 08/07/20 1324  Weight: (!) 512 kg 55.4 kg 55.4 kg    Intake/Output: I/O last 3 completed shifts: In: 2350.9 [I.V.:2142; IV Piggyback:208.9] Out: 2003 [Urine:1; Emesis/NG output:1; Other:2000; Stool:1]   Intake/Output this shift:  No intake/output data recorded.  Physical Exam: General: Resting in bed, in no distress  Head: Oral mucosal membranes moist  Eyes: Anicteric  Neck:  Trachea at the midline  Lungs:  Normal and symmetrical effort, lungs clear bilaterally  Heart: S1 S2, no rubs or gallop  Abdomen:  Soft, non tender, BS +   Extremities: No peripheral edema.  Neurologic: Alert, oriented   Skin:  No acute rashes or  lesions  Access: Right AVF    Basic Metabolic Panel: Recent Labs  Lab 08/08/20 0508 08/08/20 0508 08/09/20 1219 08/09/20 0633 08/10/20 0517 08/10/20 0800 08/11/20 0511  NA 140  --  139  --  136 136 138  K 3.9  --  3.8  --  3.8 4.4 4.2  CL 94*  --  92*  --  76* 84* 94*  CO2 36*  --  35*  --  46* 37* 34*  GLUCOSE 190*  --  201*  --  892* 400* 128*  BUN 26*  --  22  --  42* 51* 30*  CREATININE 5.37*  --  3.76*  --  4.48* 5.39* 3.38*  CALCIUM 7.5*   < > 7.8*   < > 6.5* 8.0* 7.9*   < > = values in this interval not displayed.    Liver Function Tests: Recent Labs  Lab 08/07/20 0841 08/08/20 0508 08/09/20 0633 08/10/20 0517 08/11/20 0511  AST 40 27 33 29 41  ALT 19  13 14 13 18   ALKPHOS 102 79 68 59 78  BILITOT 1.2 0.8 0.8 0.6 0.7  PROT 7.1 5.9* 5.5* 4.6* 5.6*  ALBUMIN 3.9 3.0* 2.8* 2.3* 2.8*   No results for input(s): LIPASE, AMYLASE in the last 168 hours. No results for input(s): AMMONIA in the last 168 hours.  CBC: Recent Labs  Lab 08/07/20 0841 08/08/20 0508 08/09/20 0633 08/10/20 0517 08/11/20 0511  WBC 7.3 4.7 5.5 6.7 10.5  NEUTROABS 6.4 3.5 4.4 5.4 8.0*  HGB 8.9* 8.0* 8.3* 8.0* 9.3*  HCT 27.4* 24.2* 25.4* 23.0* 27.6*  MCV 98.9 96.0 95.8 91.6 93.2  PLT 175 166 180 192 277    Cardiac Enzymes: No results for input(s): CKTOTAL, CKMB, CKMBINDEX, TROPONINI in the last 168 hours.  BNP: Invalid input(s): POCBNP  CBG: Recent Labs  Lab 08/10/20 1149 08/10/20 1611 08/10/20 2000 08/11/20 0813 08/11/20 1201  GLUCAP 435* 141* 101* 136* 172*    Microbiology: Results for orders placed or performed during the hospital encounter of 08/07/20  SARS Coronavirus 2 by RT PCR (hospital order, performed in [facility] hospital lab) Nasopharyngeal Urine, Catheterized     Status: Abnormal   Collection Time: 08/07/20  8:41 AM   Specimen: Urine, Catheterized; Nasopharyngeal  Result Value Ref Range Status   SARS Coronavirus 2 POSITIVE (A) NEGATIVE Final    Comment: RESULT CALLED TO, READ BACK BY AND VERIFIED WITH: MARY NEEDHAM AT 1019 ON 08/07/2020 Martin. (NOTE) SARS-CoV-2 target nucleic acids are DETECTED  SARS-CoV-2 RNA is generally detectable in upper respiratory specimens  during the acute phase of infection.  Positive results are indicative  of the presence of the identified virus, but do not rule out bacterial infection or co-infection with other pathogens not detected by the test.  Clinical correlation with patient history and  other diagnostic information is necessary to determine patient infection status.  The expected result is negative.  Fact Sheet for Patients:   StrictlyIdeas.no   Fact Sheet for  Healthcare Providers:   BankingDealers.co.za    This test is not yet approved or cleared by the [place] FDA and  has been authorized for detection and/or diagnosis of SARS-CoV-2 by FDA under an Emergency Use Authorization (EUA).  This EUA will remain in effect (meaning thi s test can be used) for the duration of  the COVID-19 declaration under Section 564(b)(1) of the Act, 21 U.S.C. section 360-bbb-3(b)(1), unless the authorization is terminated or revoked sooner.  Performed at [facility], [address] [place], [place] [address]   CULTURE, BLOOD (ROUTINE X 2) w Reflex to ID Panel     Status: None (Preliminary result)   Collection Time: 08/10/20  6:15 PM   Specimen: BLOOD  Result Value Ref Range Status   Specimen Description BLOOD LEFT AC  Final   Special Requests   Final    BOTTLES DRAWN AEROBIC AND ANAEROBIC Blood Culture adequate volume   Culture   Final    NO GROWTH < 12 HOURS Performed at [facility], [address], [place], [place] [address]    Report Status PENDING  Incomplete  CULTURE, BLOOD (ROUTINE X 2) w Reflex to ID Panel     Status: None (Preliminary result)   Collection Time: 08/10/20  6:20 PM   Specimen: BLOOD  Result Value Ref Range Status   Specimen Description BLOOD LEFT HAND  Final   Special Requests   Final    BOTTLES DRAWN AEROBIC AND ANAEROBIC Blood Culture adequate volume   Culture   Final    NO GROWTH < 12 HOURS Performed at [facility], [address] [place], [place] [address]    Report Status PENDING  Incomplete    Coagulation Studies: No results for input(s): LABPROT, INR in the last 72 hours.  Urinalysis: No results for input(s): COLORURINE, LABSPEC, PHURINE, GLUCOSEU, HGBUR, BILIRUBINUR, KETONESUR, PROTEINUR, UROBILINOGEN, NITRITE, LEUKOCYTESUR in the last 72 hours.  Invalid input(s): APPERANCEUR    Imaging: No results found.   Medications:    sodium chloride      remdesivir 100 mg in NS 100 mL 100 mg (08/10/20 1100)    amLODipine  5 mg Oral Daily   aspirin EC  81 mg Oral Daily   atorvastatin  40 mg Oral q1800   [START ON 08/12/2020] azithromycin  250 mg Oral Daily   calcium acetate  2,001 mg Oral TID WC   Chlorhexidine Gluconate Cloth  6 each Topical Daily   dexamethasone (DECADRON) injection  6 mg Intravenous Q24H   epoetin (EPOGEN/PROCRIT) injection  10,000 Units Intravenous Q T,Th,Sa-HD   furosemide  20 mg Oral Daily   heparin  5,000 Units Subcutaneous Q8H   insulin aspart  0-6  Units Subcutaneous TID WC   insulin aspart  5 Units Subcutaneous TID WC   insulin detemir  5 Units Subcutaneous Daily   losartan  50 mg Oral Daily   metoCLOPramide (REGLAN) injection  5 mg Intravenous Q6H   pantoprazole  40 mg Oral Daily   sodium chloride flush  3 mL Intravenous Q12H   sodium chloride, acetaminophen, alum & mag hydroxide-simeth, hydrOXYzine, loperamide, methadone, ondansetron **OR** ondansetron (ZOFRAN) IV, polyethylene glycol, sodium chloride flush  Assessment/ Plan:  [name] is a 64 y.o. Hispanic Spanish Speaking only female with end stage renal disease on hemodialysis, insulin-dependent diabetes, diabetic retinopathy, hypertension, hyperlipidemia, history of hemorrhagic cystitis, migraine headaches, osteoarthritis, osteoporosis who is admitted to [facility] on Hyperkalemia [E87.5] Lymphopenia [D72.810] Hypoxia [R09.02] Hypoxic [R09.02] ESRD (end stage renal disease) ([place]) [N18.6] Fever in other diseases [R50.81] COVID-19 [U07.1]    [facility] [place] right upper arm AV fistula 51.5kg  1. End Stage Renal Disease with hyperkalemia: emergent dialysis on admission.  Patient got dialyzed yesterday, Fluid and volume status acceptable, will continue TTS schedule.  2. Hypertension:  BP elevated this morning. Will continue  Amlodipine and furosemide.Will continue monitoring BP readings.  3. Anemia of chronic kidney disease:  hemoglobin improving 9.3 today Receiving Epogen with dialysis  Plan to continue  Mircera as outpatient. .   4. Secondary Hyperparathyroidism:  Will continue monitoring  Patient was seen and examined with Crosby Oyster, DNP. Additionally right thigh pain is without erythema, edema or induration. Above plan was discussed and agreed upon on the signing of this note.    LOS: 4 Romney Compean 9/3/20211:07 PM

## 2020-08-11 NOTE — Progress Notes (Signed)
Pt for discharge home. dtr called to pick pt up earlier. Discharge instructions discussed with pt/ meds diet activity and f/u.   Pt verbalizes understanding.  Instructions in spanish.  Sl/d/cd/ no voiced c/o.waiting on dtr to pick her up

## 2020-08-11 NOTE — Evaluation (Signed)
Physical Therapy Evaluation Patient Details Name: Anita Contreras MRN: [id number] DOB: 1956/08/16 Today's Date: 08/11/2020   History of Present Illness  Per MD notes: Pt is a 64 y.o. female with HTN, DM 2, ESRD on HD who missed dialysis x2 presented with 2 days of diarrhea and weakness.  Pt became progressively weaker and noted having a hard time getting about because she is so tired and weak so came to the ED.  MD assessment includes: Acute respiratory failure secondary to COVID-19 pneumonia, N&V secondary to Covid, Type 2 diabetes mellitus with end-stage renal disease, HTN, and weakness.    Clinical Impression  Pt put forth good effort during the session and demonstrated very good functional strength throughout.  Pt was able to quickly and easily perform sup to/from sit and presented with very good eccentric and concentric control and stability with transfers.  Pt was able to amb with a RW, which she uses mostly at baseline per phone conversation with her daughter, with very good stability and control including during sharp turns and start/stops.  Pt then ambulated without an AD with fair to good stability occasionally furniture cruising but ambulated with confidence and good gait speed.  Pt's SpO2 remained in the low to mid 90s on room air during the session with HR WNL. Pt reported feeling that she has returned to her functional baseline at this time.  Will complete PT orders at this time but will reassess pt pending a change in status upon receipt of new PT orders.     Follow Up Recommendations No PT follow up    Equipment Recommendations  None recommended by PT    Recommendations for Other Services       Precautions / Restrictions Precautions Precautions: Fall Restrictions Weight Bearing Restrictions: No      Mobility  Bed Mobility Overal bed mobility: Independent                Transfers Overall transfer level: Independent               General transfer  comment: Good concentric and eccentric control with transfers  Ambulation/Gait Ambulation/Gait assistance: Modified independent (Device/Increase time) Gait Distance (Feet): 80 Feet Assistive device: Rolling walker (2 wheeled);None Gait Pattern/deviations: WFL(Within Functional Limits) Gait velocity: WFL   General Gait Details: Pt steady with amb with a RW with no LOB including during sharp turns; Pt steady with amb without an AD with occasional furniture cruising  Stairs            Wheelchair Mobility    Modified Rankin (Stroke Patients Only)       Balance Overall balance assessment: No apparent balance deficits (not formally assessed)                                           Pertinent Vitals/Pain Pain Assessment: No/denies pain    Home Living Family/patient expects to be discharged to:: Private residence Living Arrangements: Children Available Help at Discharge: Family;Available 24 hours/day Type of Home: Mobile home Home Access: Stairs to enter Entrance Stairs-Rails: Right;Left (Too wide for both) Entrance Stairs-Number of Steps: 3 Home Layout: One level Home Equipment: Walker - 2 wheels Additional Comments: Pt lives with one daughter and the other daughter assists to ensure 24/7 supervision available    Prior Function Level of Independence: Independent with assistive device(s)         Comments:  Mod Ind amb with a RW with occasional amb without an AD, no falls in the last year, Ind with bathing and dressing, daughters assist with errands and driving pt to appointments     Hand Dominance        Extremity/Trunk Assessment   Upper Extremity Assessment Upper Extremity Assessment: Overall WFL for tasks assessed    Lower Extremity Assessment Lower Extremity Assessment: Overall WFL for tasks assessed       Communication   Communication: Interpreter utilized Public house manager on a stick utilized throughout the session)  Cognition  Arousal/Alertness: Awake/alert Behavior During Therapy: WFL for tasks assessed/performed Overall Cognitive Status: Within Functional Limits for tasks assessed                                        General Comments      Exercises     Assessment/Plan    PT Assessment Patent does not need any further PT services  PT Problem List         PT Treatment Interventions      PT Goals (Current goals can be found in the Care Plan section)  Acute Rehab PT Goals PT Goal Formulation: All assessment and education complete, DC therapy    Frequency     Barriers to discharge        Co-evaluation               AM-PAC PT "6 Clicks" Mobility  Outcome Measure Help needed turning from your back to your side while in a flat bed without using bedrails?: None Help needed moving from lying on your back to sitting on the side of a flat bed without using bedrails?: None Help needed moving to and from a bed to a chair (including a wheelchair)?: None Help needed standing up from a chair using your arms (e.g., wheelchair or bedside chair)?: None Help needed to walk in hospital room?: None Help needed climbing 3-5 steps with a railing? : None 6 Click Score: 24    End of Session Equipment Utilized During Treatment: Gait belt Activity Tolerance: Patient tolerated treatment well Patient left: in bed;with call bell/phone within reach;with bed alarm set Nurse Communication: Mobility status PT Visit Diagnosis: Muscle weakness (generalized) (M62.81)    Time: 4656-8127 PT Time Calculation (min) (ACUTE ONLY): 49 min   Charges:   PT Evaluation $PT Eval Low Complexity: 1 Low          D. Royetta Asal PT, DPT 08/11/20, 4:54 PM

## 2020-08-11 NOTE — Discharge Summary (Signed)
[facility] NAME: Anita Contreras    MR#:  [id number]  DATE OF BIRTH:  06/01/1956  DATE OF ADMISSION:  08/07/2020 ADMITTING PHYSICIAN: Vashti Hey, MD  DATE OF DISCHARGE: 08/11/2020  PRIMARY CARE PHYSICIAN: Frazier Richards, MD    ADMISSION DIAGNOSIS:  Hyperkalemia [E87.5] Lymphopenia [D72.810] Hypoxia [R09.02] Hypoxic [R09.02] ESRD (end stage renal disease) ([place]) [N18.6] Fever in other diseases [R50.81] COVID-19 [U07.1]  DISCHARGE DIAGNOSIS:  Principal Problem:   Hyperkalemia Active Problems:   ESRD on dialysis [facility])   Essential hypertension   Type 2 diabetes mellitus with ESRD (end-stage renal disease) (HCC)   Steal syndrome as complication of dialysis access ([place])   Pneumonia due to COVID-19 virus   Type II diabetes mellitus with ophthalmic manifestations, uncontrolled ([place])   Weakness   SECONDARY DIAGNOSIS:   Past Medical History:  Diagnosis Date  . Acute cystitis with hematuria   . Anemia   . Cataracts, bilateral   . CKD (chronic kidney disease)   . Diabetic retinopathy, background ([place])   . Dialysis patient ([place])    Tues, Thurs, Sat  . Dizziness   . Hyperlipidemia   . Hypertension   . Migraine variant   . Neuropathy   . OP (osteoporosis)   . Personal history of noncompliance with medical treatment, presenting hazards to health   . Steal syndrome as complication of dialysis access ([place])   . Stroke ([place])   . Type II diabetes mellitus with ophthalmic manifestations, uncontrolled ([place])   . Vitamin D deficiency     HOSPITAL COURSE:   1.  Acute respiratory failure secondary to COVID-19 pneumonia.  Patient was tapered off oxygen the other day. 2.  COVID-19 pneumonia and gastroenteritis.  Finished remdesivir day 5 out of 5.  The patient was on IV Decadron while here and will switch over to oral Decadron for another 5 days.  The patient had a high temperature of 103 when she came in and no  blood cultures were sent on presentation.  I sent blood cultures yesterday which are so far negative.  Patient is looking better and her procalcitonin did trend higher so I will give empiric Zithromax. 2.  Nausea vomiting secondary to Covid.  This has resolved and patient tolerating diet. 3.  Type 2 diabetes mellitus with end-stage renal disease.  Hemoglobin A1c good at 6.4.  Dialysis on Tuesday Thursday and Saturday.  She will have dialysis at [facility] starting tomorrow. 4.  Essential hypertension on amlodipine losartan and Lasix 5.  Hyperlipidemia unspecified.  Continue atorvastatin 6.  Weakness.  Patient did well today with physical therapy was not feeling well enough to do physical therapy anytime sooner than today. 7.  Hyperkalemia on presentation improved with dialysis  DISCHARGE CONDITIONS:   Satisfactory  CONSULTS OBTAINED:  Nephrology  DRUG ALLERGIES:   Allergies  Allergen Reactions  . Dextromethorphan-Guaifenesin     Other reaction(s): Itching of skin (finding)  . Propofol Itching    Pt says " when getting anesthesia by body itches" unsure what type of anesthesia    DISCHARGE MEDICATIONS:   Allergies as of 08/11/2020      Reactions   Dextromethorphan-guaifenesin    Other reaction(s): Itching of skin (finding)   Propofol Itching   Pt says " when getting anesthesia by body itches" unsure what type of anesthesia      Medication List    STOP taking these medications   docusate sodium 100 MG capsule Commonly known  as: COLACE   hydrALAZINE 50 MG tablet Commonly known as: APRESOLINE     TAKE these medications   acetaminophen 500 MG tablet Commonly known as: TYLENOL Take 1,000 mg by mouth every 6 (six) hours as needed for mild pain or headache.   amLODipine 10 MG tablet Commonly known as: NORVASC Take 1 tablet (10 mg total) by mouth daily. Notes to patient: 08-12-20   aspirin 81 MG EC tablet TAKE 1 TABLET BY MOUTH EVERY DAY Notes to patient: 08-12-20    atorvastatin 40 MG tablet Commonly known as: LIPITOR Take 40 mg by mouth daily at 6 PM. Notes to patient: Take today at 6 pm   azithromycin 250 MG tablet Commonly known as: ZITHROMAX Take 1 tablet (250 mg total) by mouth daily. Start taking on: August 12, 2020   calcium acetate 667 MG capsule Commonly known as: PHOSLO Take 1 capsule (667 mg total) by mouth 3 (three) times daily with meals. Notes to patient: Take this pm dinner time 08-11-20   dexamethasone 2 MG tablet Commonly known as: DECADRON Two tabs po daily for three days then one tab po daily for two days then stop   furosemide 20 MG tablet Commonly known as: LASIX Take 20 mg by mouth daily.   hydrOXYzine 25 MG tablet Commonly known as: ATARAX/VISTARIL Take 25 mg by mouth 2 (two) times daily as needed for itching.   insulin NPH-regular Human (70-30) 100 UNIT/ML injection Inject 3 Units into the skin 2 (two) times daily with a meal.   lidocaine-prilocaine cream Commonly known as: EMLA Apply 1 application topically as needed.   losartan 50 MG tablet Commonly known as: COZAAR Take 1 tablet (50 mg total) by mouth daily.   methadone 5 MG tablet Commonly known as: DOLOPHINE TAKE 1/2 TABLET BY MOUTH EVERY SIX HOURS AS NEEDED FOR PAIN   ondansetron 4 MG tablet Commonly known as: ZOFRAN Take 1 tablet (4 mg total) by mouth every 6 (six) hours as needed for nausea.   pantoprazole 40 MG tablet Commonly known as: PROTONIX Take 1 tablet (40 mg total) by mouth daily. Start taking on: August 12, 2020        DISCHARGE INSTRUCTIONS:   Follow-up with dialysis on Saturday, Tuesday and Thursday Follow-up PMD 2 weeks  If you experience worsening of your admission symptoms, develop shortness of breath, life threatening emergency, suicidal or homicidal thoughts you must seek medical attention immediately by calling 911 or calling your MD immediately  if symptoms less severe.  You Must read complete  instructions/literature along with all the possible adverse reactions/side effects for all the Medicines you take and that have been prescribed to you. Take any new Medicines after you have completely understood and accept all the possible adverse reactions/side effects.   Please note  You were cared for by a hospitalist during your hospital stay. If you have any questions about your discharge medications or the care you received while you were in the hospital after you are discharged, you can call the unit and asked to speak with the hospitalist on call if the hospitalist that took care of you is not available. Once you are discharged, your primary care physician will handle any further medical issues. Please note that NO REFILLS for any discharge medications will be authorized once you are discharged, as it is imperative that you return to your primary care physician (or establish a relationship with a primary care physician if you do not have one) for your aftercare needs  so that they can reassess your need for medications and monitor your lab values.    Today   CHIEF COMPLAINT:  No chief complaint on file.   HISTORY OF PRESENT ILLNESS:   64 year old female with hypertension type 2 diabetes mellitus end-stage renal disease presented with 2 days of diarrhea and weakness.   VITAL SIGNS:  Blood pressure (!) 156/56, pulse 61, temperature 98.8 F (37.1 C), resp. rate 16, height 4\' 1"  (1.245 m), weight 55.4 kg, last menstrual period 09/19/2001, SpO2 95 %.  I/O:    Intake/Output Summary (Last 24 hours) at 08/11/2020 1740 Last data filed at 08/10/2020 1800 Gross per 24 hour  Intake --  Output 2000 ml  Net -2000 ml    PHYSICAL EXAMINATION:  GENERAL:  64 y.o.-year-old patient lying in the bed with no acute distress.  EYES: Pupils equal, round, reactive to light and accommodation. No scleral icterus.  HEENT: Head atraumatic, normocephalic. Oropharynx and nasopharynx clear.  LUNGS: Normal  breath sounds bilaterally, no wheezing, rales,rhonchi or crepitation. No use of accessory muscles of respiration.  CARDIOVASCULAR: S1, S2 normal. No murmurs, rubs, or gallops.  ABDOMEN: Soft, non-tender, non-distended.  EXTREMITIES: No pedal edema.  NEUROLOGIC: Cranial nerves II through XII are intact. Muscle strength 5/5 in all extremities. Sensation intact. Gait not checked.  PSYCHIATRIC: The patient is alert and oriented x 3.  SKIN: No obvious rash, lesion, or ulcer.   DATA REVIEW:   CBC Recent Labs  Lab 08/11/20 0511  WBC 10.5  HGB 9.3*  HCT 27.6*  PLT 277    Chemistries  Recent Labs  Lab 08/11/20 0511  NA 138  K 4.2  CL 94*  CO2 34*  GLUCOSE 128*  BUN 30*  CREATININE 3.38*  CALCIUM 7.9*  AST 41  ALT 18  ALKPHOS 78  BILITOT 0.7    Microbiology Results  Results for orders placed or performed during the hospital encounter of 08/07/20  SARS Coronavirus 2 by RT PCR (hospital order, performed in [facility] hospital lab) Nasopharyngeal Urine, Catheterized     Status: Abnormal   Collection Time: 08/07/20  8:41 AM   Specimen: Urine, Catheterized; Nasopharyngeal  Result Value Ref Range Status   SARS Coronavirus 2 POSITIVE (A) NEGATIVE Final    Comment: RESULT CALLED TO, READ BACK BY AND VERIFIED WITH: MARY NEEDHAM AT 1019 ON 08/07/2020 [place]. (NOTE) SARS-CoV-2 target nucleic acids are DETECTED  SARS-CoV-2 RNA is generally detectable in upper respiratory specimens  during the acute phase of infection.  Positive results are indicative  of the presence of the identified virus, but do not rule out bacterial infection or co-infection with other pathogens not detected by the test.  Clinical correlation with patient history and  other diagnostic information is necessary to determine patient infection status.  The expected result is negative.  Fact Sheet for Patients:   StrictlyIdeas.no   Fact Sheet for Healthcare Providers:    BankingDealers.co.za    This test is not yet approved or cleared by the [place] FDA and  has been authorized for detection and/or diagnosis of SARS-CoV-2 by FDA under an Emergency Use Authorization (EUA).  This EUA will remain in effect (meaning thi s test can be used) for the duration of  the COVID-19 declaration under Section 564(b)(1) of the Act, 21 U.S.C. section 360-bbb-3(b)(1), unless the authorization is terminated or revoked sooner.  Performed at [facility], [place], [place] [address]   CULTURE, BLOOD (ROUTINE X 2) w Reflex to ID  Panel     Status: None (Preliminary result)   Collection Time: 08/10/20  6:15 PM   Specimen: BLOOD  Result Value Ref Range Status   Specimen Description BLOOD LEFT AC  Final   Special Requests   Final    BOTTLES DRAWN AEROBIC AND ANAEROBIC Blood Culture adequate volume   Culture   Final    NO GROWTH < 12 HOURS Performed at [facility], [address], [place], [place] [address]    Report Status PENDING  Incomplete  CULTURE, BLOOD (ROUTINE X 2) w Reflex to ID Panel     Status: None (Preliminary result)   Collection Time: 08/10/20  6:20 PM   Specimen: BLOOD  Result Value Ref Range Status   Specimen Description BLOOD LEFT HAND  Final   Special Requests   Final    BOTTLES DRAWN AEROBIC AND ANAEROBIC Blood Culture adequate volume   Culture   Final    NO GROWTH < 12 HOURS Performed at [facility], [address], [place], [place] [address]    Report Status PENDING  Incomplete      Management plans discussed with the patient, family and they are in agreement.  CODE STATUS:     Code Status Orders  (From admission, onward)         Start     Ordered   08/07/20 1206  Full code  Continuous        08/07/20 1210        Code Status History    Date Active Date Inactive Code Status Order ID Comments User Context   02/24/2018 1247 02/25/2018 1737 Full Code [id number]   Dustin Flock, MD Inpatient   01/26/2018 1533 01/27/2018 2212 Full Code [id number]  Bettey Costa, MD Inpatient   12/24/2017 0131 12/25/2017 2058 Full Code [id number]  Lance Coon, MD Inpatient   01/14/2017 0925 01/17/2017 1803 Full Code [id number]  Laverle Hobby, MD ED   11/08/2016 1808 11/10/2016 1754 Full Code [id number]  Algernon Huxley, MD Inpatient   10/17/2016 1319 10/17/2016 1924 Full Code [id number]  Algernon Huxley, MD Inpatient   04/23/2016 2029 05/07/2016 2001 Full Code [id number]  Vaughan Basta, MD Inpatient   04/15/2016 1918 04/19/2016 1608 Full Code [id number]  Gladstone Lighter, MD Inpatient   Advance Care Planning Activity      TOTAL TIME TAKING CARE OF THIS PATIENT: 38 minutes.    Loletha Grayer M.D on 08/11/2020 at 5:40 PM  Between 7am to 6pm - Pager - [phone]  After 6pm go to [email or web address] - password [email or web address]  [facility]  CC: Primary care physician; Frazier Richards, MD

## 2020-08-15 LAB — CULTURE, BLOOD (ROUTINE X 2)
Culture: NO GROWTH
Culture: NO GROWTH
Special Requests: ADEQUATE
Special Requests: ADEQUATE

## 2020-08-21 ENCOUNTER — Ambulatory Visit (INDEPENDENT_AMBULATORY_CARE_PROVIDER_SITE_OTHER): Payer: Self-pay | Admitting: Nurse Practitioner

## 2020-08-21 ENCOUNTER — Encounter (INDEPENDENT_AMBULATORY_CARE_PROVIDER_SITE_OTHER): Payer: Self-pay

## 2020-09-05 ENCOUNTER — Other Ambulatory Visit (INDEPENDENT_AMBULATORY_CARE_PROVIDER_SITE_OTHER): Payer: Self-pay | Admitting: Nurse Practitioner

## 2020-09-05 DIAGNOSIS — N186 End stage renal disease: Secondary | ICD-10-CM

## 2020-09-06 ENCOUNTER — Ambulatory Visit (INDEPENDENT_AMBULATORY_CARE_PROVIDER_SITE_OTHER): Payer: Self-pay | Admitting: Nurse Practitioner

## 2020-09-06 ENCOUNTER — Encounter (INDEPENDENT_AMBULATORY_CARE_PROVIDER_SITE_OTHER): Payer: Self-pay

## 2020-09-27 ENCOUNTER — Encounter (INDEPENDENT_AMBULATORY_CARE_PROVIDER_SITE_OTHER): Payer: Self-pay | Admitting: Nurse Practitioner

## 2021-02-10 ENCOUNTER — Observation Stay
Admission: EM | Admit: 2021-02-10 | Discharge: 2021-02-11 | DRG: 189 | Disposition: A | Discharge status: Home / Self Care | Payer: Medicaid Other | Attending: Hospitalist | Admitting: Hospitalist

## 2021-02-10 ENCOUNTER — Emergency Department: Payer: Medicaid Other

## 2021-02-10 ENCOUNTER — Other Ambulatory Visit: Payer: Self-pay

## 2021-02-10 ENCOUNTER — Encounter: Payer: Self-pay | Admitting: Emergency Medicine

## 2021-02-10 DIAGNOSIS — H269 Unspecified cataract: Secondary | ICD-10-CM | POA: Diagnosis not present

## 2021-02-10 DIAGNOSIS — N186 End stage renal disease: Secondary | ICD-10-CM | POA: Diagnosis present

## 2021-02-10 DIAGNOSIS — Z8673 Personal history of transient ischemic attack (TIA), and cerebral infarction without residual deficits: Secondary | ICD-10-CM | POA: Diagnosis not present

## 2021-02-10 DIAGNOSIS — R0603 Acute respiratory distress: Secondary | ICD-10-CM

## 2021-02-10 DIAGNOSIS — I120 Hypertensive chronic kidney disease with stage 5 chronic kidney disease or end stage renal disease: Secondary | ICD-10-CM | POA: Diagnosis not present

## 2021-02-10 DIAGNOSIS — J810 Acute pulmonary edema: Secondary | ICD-10-CM | POA: Diagnosis not present

## 2021-02-10 DIAGNOSIS — J9601 Acute respiratory failure with hypoxia: Secondary | ICD-10-CM | POA: Diagnosis present

## 2021-02-10 DIAGNOSIS — Z794 Long term (current) use of insulin: Secondary | ICD-10-CM | POA: Diagnosis not present

## 2021-02-10 DIAGNOSIS — I10 Essential (primary) hypertension: Secondary | ICD-10-CM | POA: Diagnosis present

## 2021-02-10 DIAGNOSIS — Z992 Dependence on renal dialysis: Secondary | ICD-10-CM | POA: Diagnosis not present

## 2021-02-10 DIAGNOSIS — J9691 Respiratory failure, unspecified with hypoxia: Secondary | ICD-10-CM | POA: Diagnosis present

## 2021-02-10 DIAGNOSIS — E1140 Type 2 diabetes mellitus with diabetic neuropathy, unspecified: Secondary | ICD-10-CM | POA: Diagnosis not present

## 2021-02-10 DIAGNOSIS — N2581 Secondary hyperparathyroidism of renal origin: Secondary | ICD-10-CM | POA: Diagnosis not present

## 2021-02-10 DIAGNOSIS — E11319 Type 2 diabetes mellitus with unspecified diabetic retinopathy without macular edema: Secondary | ICD-10-CM | POA: Diagnosis not present

## 2021-02-10 DIAGNOSIS — E785 Hyperlipidemia, unspecified: Secondary | ICD-10-CM | POA: Diagnosis not present

## 2021-02-10 DIAGNOSIS — Z79899 Other long term (current) drug therapy: Secondary | ICD-10-CM | POA: Diagnosis not present

## 2021-02-10 DIAGNOSIS — D631 Anemia in chronic kidney disease: Secondary | ICD-10-CM | POA: Diagnosis not present

## 2021-02-10 DIAGNOSIS — Z8616 Personal history of COVID-19: Secondary | ICD-10-CM

## 2021-02-10 DIAGNOSIS — Z20822 Contact with and (suspected) exposure to covid-19: Secondary | ICD-10-CM | POA: Diagnosis not present

## 2021-02-10 DIAGNOSIS — E1122 Type 2 diabetes mellitus with diabetic chronic kidney disease: Secondary | ICD-10-CM | POA: Diagnosis not present

## 2021-02-10 DIAGNOSIS — K219 Gastro-esophageal reflux disease without esophagitis: Secondary | ICD-10-CM | POA: Diagnosis not present

## 2021-02-10 DIAGNOSIS — R0902 Hypoxemia: Secondary | ICD-10-CM

## 2021-02-10 DIAGNOSIS — J90 Pleural effusion, not elsewhere classified: Secondary | ICD-10-CM | POA: Diagnosis not present

## 2021-02-10 DIAGNOSIS — E1139 Type 2 diabetes mellitus with other diabetic ophthalmic complication: Secondary | ICD-10-CM | POA: Diagnosis present

## 2021-02-10 DIAGNOSIS — Z7982 Long term (current) use of aspirin: Secondary | ICD-10-CM | POA: Diagnosis not present

## 2021-02-10 LAB — CBC
HCT: 34.1 % — ABNORMAL LOW (ref 36.0–46.0)
Hemoglobin: 11.4 g/dL — ABNORMAL LOW (ref 12.0–15.0)
MCH: 31.9 pg (ref 26.0–34.0)
MCHC: 33.4 g/dL (ref 30.0–36.0)
MCV: 95.5 fL (ref 80.0–100.0)
Platelets: 166 10*3/uL (ref 150–400)
RBC: 3.57 MIL/uL — ABNORMAL LOW (ref 3.87–5.11)
RDW: 14.5 % (ref 11.5–15.5)
WBC: 4.6 10*3/uL (ref 4.0–10.5)
nRBC: 0 % (ref 0.0–0.2)

## 2021-02-10 LAB — HEPATIC FUNCTION PANEL
ALT: 17 U/L (ref 0–44)
AST: 30 U/L (ref 15–41)
Albumin: 4.1 g/dL (ref 3.5–5.0)
Alkaline Phosphatase: 120 U/L (ref 38–126)
Bilirubin, Direct: 0.2 mg/dL (ref 0.0–0.2)
Indirect Bilirubin: 0.9 mg/dL (ref 0.3–0.9)
Total Bilirubin: 1.1 mg/dL (ref 0.3–1.2)
Total Protein: 7.1 g/dL (ref 6.5–8.1)

## 2021-02-10 LAB — TROPONIN I (HIGH SENSITIVITY)
Troponin I (High Sensitivity): 10 ng/L (ref <18)
Troponin I (High Sensitivity): 12 ng/L (ref <18)

## 2021-02-10 LAB — BASIC METABOLIC PANEL
Anion gap: 12 (ref 5–15)
BUN: 11 mg/dL (ref 8–23)
CO2: 28 mmol/L (ref 22–32)
Calcium: 8.3 mg/dL — ABNORMAL LOW (ref 8.9–10.3)
Chloride: 99 mmol/L (ref 98–111)
Creatinine, Ser: 2.86 mg/dL — ABNORMAL HIGH (ref 0.44–1.00)
GFR, Estimated: 18 mL/min — ABNORMAL LOW (ref >60)
Glucose, Bld: 269 mg/dL — ABNORMAL HIGH (ref 70–99)
Potassium: 3.5 mmol/L (ref 3.5–5.1)
Sodium: 139 mmol/L (ref 135–145)

## 2021-02-10 LAB — BLOOD GAS, VENOUS
Acid-Base Excess: 13.3 mmol/L — ABNORMAL HIGH (ref 0.0–2.0)
Bicarbonate: 36.6 mmol/L — ABNORMAL HIGH (ref 20.0–28.0)
O2 Saturation: 99.8 %
Patient temperature: 37
pCO2, Ven: 40 mmHg — ABNORMAL LOW (ref 44.0–60.0)
pH, Ven: 7.57 — ABNORMAL HIGH (ref 7.250–7.430)
pO2, Ven: 188 mmHg — ABNORMAL HIGH (ref 32.0–45.0)

## 2021-02-10 LAB — RESP PANEL BY RT-PCR (FLU A&B, COVID) ARPGX2
Influenza A by PCR: NEGATIVE
Influenza B by PCR: NEGATIVE
SARS Coronavirus 2 by RT PCR: NEGATIVE

## 2021-02-10 LAB — GLUCOSE, CAPILLARY: Glucose-Capillary: 123 mg/dL — ABNORMAL HIGH (ref 70–99)

## 2021-02-10 MED ORDER — HYDRALAZINE HCL 20 MG/ML IJ SOLN
10.0000 mg | Freq: Four times a day (QID) | INTRAMUSCULAR | Status: DC | PRN
  Administered 2021-02-10 – 2021-02-11 (×2): 10 mg via INTRAVENOUS
  Filled 2021-02-10 (×2): qty 1

## 2021-02-10 MED ORDER — ACETAMINOPHEN 500 MG PO TABS: 1000.0000 mg | ORAL_TABLET | Freq: Four times a day (QID) | ORAL | Status: DC | PRN

## 2021-02-10 MED ORDER — LOSARTAN POTASSIUM 50 MG PO TABS
50.0000 mg | ORAL_TABLET | Freq: Every day | ORAL | Status: DC
  Administered 2021-02-11: 50 mg via ORAL
  Filled 2021-02-10: qty 1

## 2021-02-10 MED ORDER — PANTOPRAZOLE SODIUM 40 MG PO TBEC
40.0000 mg | DELAYED_RELEASE_TABLET | Freq: Every day | ORAL | Status: DC
  Administered 2021-02-11: 40 mg via ORAL
  Filled 2021-02-10: qty 1

## 2021-02-10 MED ORDER — HYDROXYZINE HCL 25 MG PO TABS: 25.0000 mg | ORAL_TABLET | Freq: Two times a day (BID) | ORAL | Status: DC | PRN

## 2021-02-10 MED ORDER — FUROSEMIDE 20 MG PO TABS
20.0000 mg | ORAL_TABLET | Freq: Every day | ORAL | Status: DC
  Administered 2021-02-11: 20 mg via ORAL
  Filled 2021-02-10: qty 1

## 2021-02-10 MED ORDER — CALCIUM ACETATE (PHOS BINDER) 667 MG PO CAPS
667.0000 mg | ORAL_CAPSULE | Freq: Three times a day (TID) | ORAL | Status: DC
Start: 2021-02-11 — End: 2021-02-12
  Administered 2021-02-11 (×2): 667 mg via ORAL
  Filled 2021-02-10 (×4): qty 1

## 2021-02-10 MED ORDER — AMLODIPINE BESYLATE 10 MG PO TABS
10.0000 mg | ORAL_TABLET | Freq: Every day | ORAL | Status: DC
  Administered 2021-02-11: 10 mg via ORAL
  Filled 2021-02-10: qty 1

## 2021-02-10 MED ORDER — ATORVASTATIN CALCIUM 20 MG PO TABS: 40.0000 mg | ORAL_TABLET | Freq: Every day | ORAL | Status: DC

## 2021-02-10 MED ORDER — ONDANSETRON HCL 4 MG PO TABS: 4.0000 mg | ORAL_TABLET | Freq: Four times a day (QID) | ORAL | Status: DC | PRN

## 2021-02-10 MED ORDER — HEPARIN SODIUM (PORCINE) 5000 UNIT/ML IJ SOLN
5000.0000 [IU] | Freq: Three times a day (TID) | INTRAMUSCULAR | Status: DC
  Administered 2021-02-10 – 2021-02-11 (×2): 5000 [IU] via SUBCUTANEOUS
  Filled 2021-02-10 (×2): qty 1

## 2021-02-10 MED ORDER — ASPIRIN EC 81 MG PO TBEC
81.0000 mg | DELAYED_RELEASE_TABLET | Freq: Every day | ORAL | Status: DC
  Administered 2021-02-11: 81 mg via ORAL
  Filled 2021-02-10: qty 1

## 2021-02-10 MED ORDER — IOHEXOL 350 MG/ML SOLN
75.0000 mL | Freq: Once | INTRAVENOUS | Status: AC | PRN
  Administered 2021-02-10: 75 mL via INTRAVENOUS

## 2021-02-10 NOTE — ED Notes (Signed)
Pt resting comfortably at this time. Pt denies any needs at this time. Call bell in reach.

## 2021-02-10 NOTE — ED Notes (Signed)
Spoke with christen, rn, per floor rn is ready for pt on floor, will proceed to floor.

## 2021-02-10 NOTE — ED Notes (Signed)
Lab called to assist with VBG

## 2021-02-10 NOTE — ED Notes (Signed)
Pt found to not be placed on BP cuff, O2 or oxygen after CT. Pt found to be 87% on RA, PA aware, pt placed back on [place] 2L/min and is maintaining a O2 sat of 97%.

## 2021-02-10 NOTE — ED Notes (Signed)
With interpreter at bedside, pt states she came to ED today due to SOB. Pt states on Thursday she "felt a whistle in her throat and it scared me". Pt states she did dialysis today and felt okay and states she was on the whole time. Pt states possible fever or chills but denies taking her temp. Pt is A&Ox4. Pt denies chest pain. Pt denies N/V/D. NAD noted. Pt is 100% on on 2L/min via [place], pt states she does not chronically wear oxygen.

## 2021-02-10 NOTE — ED Triage Notes (Signed)
Pt via POV from home. Pt c/o SOB since Thursday. Denies cough. Denies pain. Pt is a dialysis pt and get dialysis every TThS and pt states that she had the full treatment today. Pt does have a hx of HTN and states she did take her medication this morning. Pt is A&Ox4 and NAD.

## 2021-02-10 NOTE — ED Provider Notes (Signed)
[facility] Emergency Department Provider Note  ____________________________________________   Event Date/Time   First MD Initiated Contact with Patient 02/10/21 1544     (approximate)  I have reviewed the triage vital signs and the nursing notes.   HISTORY  Chief Complaint Shortness of Breath    HPI Anita Duke is a 65 y.o. female presents to the emergency department with her daughter complaining of shortness of breath.  Patient's O2 saturation on room air was 87.  Increased to 94% on 2 L nasal cannula.  Patient states she heard some whistling in her throat on Thursday after dialysis.  Is felt weak and had some shortness of breath.  Mild cough.  Some fever and chills.  Patient is not vaccinated for Covid.    Past Medical History:  Diagnosis Date  . Acute cystitis with hematuria   . Anemia   . Cataracts, bilateral   . CKD (chronic kidney disease)   . Diabetic retinopathy, background ([place])   . Dialysis patient ([place])    Tues, Thurs, Sat  . Dizziness   . Hyperlipidemia   . Hypertension   . Migraine variant   . Neuropathy   . OP (osteoporosis)   . Personal history of noncompliance with medical treatment, presenting hazards to health   . Steal syndrome as complication of dialysis access ([place])   . Stroke ([place])   . Type II diabetes mellitus with ophthalmic manifestations, uncontrolled ([place])   . Vitamin D deficiency     Patient Active Problem List   Diagnosis Date Noted  . Weakness   . Acute respiratory failure due to COVID-19 ([place]) 08/07/2020  . Type II diabetes mellitus with ophthalmic manifestations, uncontrolled ([place])   . Abnormality of albumin 01/20/2020  . Mild protein-calorie malnutrition ([place]) 02/28/2019  . Dependence on renal dialysis ([place]) 02/05/2018  . HTN (hypertension) 01/26/2018  . Headache, unspecified 01/22/2018  . HCAP (healthcare-associated pneumonia) 12/23/2017  . Fever, unspecified 12/23/2017  . Bradycardia  01/17/2017  . Syncope 01/17/2017  . Nausea and vomiting 01/17/2017  . Essential hypertension, malignant 01/17/2017  . Lactic acidosis 01/17/2017  . Leukocytosis 01/17/2017  . Orthostatic hypotension 01/17/2017  . Hyperkalemia 01/14/2017  . Other chronic pain 12/14/2016  . Steal syndrome as complication of dialysis access ([place]) 11/08/2016  . Pain in left arm 10/23/2016  . ESRD on dialysis ([place]) 10/15/2016  . Renal dialysis device, implant, or graft complication [id number]  . Hyperlipidemia 10/15/2016  . Essential hypertension 10/15/2016  . Type 2 diabetes mellitus with ESRD (end-stage renal disease) ([place]) 10/15/2016  . Disorder of phosphorus metabolism, unspecified 07/01/2016  . Encounter for immunization 06/05/2016  . Other disorders of plasma-protein metabolism, not elsewhere classified 06/01/2016  . Anemia in chronic kidney disease 05/12/2016  . Iron deficiency anemia, unspecified 05/12/2016  . Coagulation defect, unspecified ([place]) 05/06/2016  . Heart failure, unspecified ([place]) 05/06/2016  . Shortness of breath 05/06/2016  . Acute on chronic renal failure ([place]) 04/23/2016  . 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 Acute right flank pain   . ARF (acute renal failure) ([place]) 04/15/2016  . Acute cystitis 05/26/2015  . Neuropathy 05/26/2015    Past Surgical History:  Procedure Laterality Date  . A/V FISTULAGRAM Right 06/03/2018   Procedure: A/V FISTULAGRAM;  Surgeon: Algernon Huxley, MD;  Location: [facility];  Service: Cardiovascular;  Laterality: Right;  . A/V FISTULAGRAM Right 02/22/2019   Procedure: A/V FISTULAGRAM;  Surgeon: Algernon Huxley, MD;  Location: [facility]  INVASIVE CV LAB;  Service: Cardiovascular;  Laterality: Right;  . A/V SHUNT INTERVENTION N/A 01/26/2018   Procedure: A/V SHUNT INTERVENTION;  Surgeon: Algernon Huxley, MD;  Location: [facility] LAB;  Service: Cardiovascular;  Laterality: N/A;  . A/V SHUNTOGRAM Right 01/26/2018    Procedure: A/V SHUNTOGRAM;  Surgeon: Algernon Huxley, MD;  Location: [facility];  Service: Cardiovascular;  Laterality: Right;  . AV FISTULA PLACEMENT Right 10/10/2016   Procedure: ARTERIOVENOUS (AV) FISTULA CREATION ( BRACHIOCEPHALIC );  Surgeon: Algernon Huxley, MD;  Location: [facility] ORS;  Service: Vascular;  Laterality: Right;  . DILATION AND CURETTAGE OF UTERUS    . DISTAL REVASCULARIZATION AND INTERVAL LIGATION (DRIL) Right 11/08/2016   Procedure: DISTAL REVASCULARIZATION AND INTERVAL LIGATION (DRIL);  Surgeon: Algernon Huxley, MD;  Location: [facility] ORS;  Service: Vascular;  Laterality: Right;  . PERIPHERAL VASCULAR CATHETERIZATION N/A 04/25/2016   Procedure: Dialysis/Perma Catheter Insertion;  Surgeon: Algernon Huxley, MD;  Location: [facility];  Service: Cardiovascular;  Laterality: N/A;  . PERIPHERAL VASCULAR CATHETERIZATION Right 10/17/2016   Procedure: Upper Extremity Angiography;  Surgeon: Algernon Huxley, MD;  Location: [place] [facility];  Service: Cardiovascular;  Laterality: Right;  . UPPER EXTREMITY ANGIOGRAPHY Right 10/12/2018   Procedure: UPPER EXTREMITY ANGIOGRAPHY;  Surgeon: Algernon Huxley, MD;  Location: [place];  Service: Cardiovascular;  Laterality: Right;    Prior to Admission medications   Medication Sig Start Date End Date Taking? Authorizing Provider  acetaminophen (TYLENOL) 500 MG tablet Take 1,000 mg by mouth every 6 (six) hours as needed for mild pain or headache.    [provider]  amLODipine (NORVASC) 10 MG tablet Take 1 tablet (10 mg total) by mouth daily. 01/27/18   Bettey Costa, MD  aspirin 81 MG EC tablet TAKE 1 TABLET BY MOUTH EVERY DAY 04/15/17   Stegmayer, Joelene Millin A, PA-C  atorvastatin (LIPITOR) 40 MG tablet Take 40 mg by mouth daily at 6 PM.     [provider]  azithromycin (ZITHROMAX) 250 MG tablet Take 1 tablet (250 mg total) by mouth daily. 08/12/20   Loletha Grayer, MD  calcium acetate (PHOSLO) 667 MG capsule Take 1 capsule (667  mg total) by mouth 3 (three) times daily with meals. 04/19/16   Fritzi Mandes, MD  dexamethasone (DECADRON) 2 MG tablet Two tabs po daily for three days then one tab po daily for two days then stop 08/11/20   Loletha Grayer, MD  furosemide (LASIX) 20 MG tablet Take 20 mg by mouth daily.    [provider]  hydrOXYzine (ATARAX/VISTARIL) 25 MG tablet Take 25 mg by mouth 2 (two) times daily as needed for itching.    [provider]  insulin NPH-regular Human (70-30) 100 UNIT/ML injection Inject 3 Units into the skin 2 (two) times daily with a meal.    [provider]  lidocaine-prilocaine (EMLA) cream Apply 1 application topically as needed.    [provider]  losartan (COZAAR) 50 MG tablet Take 1 tablet (50 mg total) by mouth daily. 01/27/18   Bettey Costa, MD  methadone (DOLOPHINE) 5 MG tablet TAKE 1/2 TABLET BY MOUTH EVERY SIX HOURS AS NEEDED FOR PAIN 07/06/20   [provider]  ondansetron (ZOFRAN) 4 MG tablet Take 1 tablet (4 mg total) by mouth every 6 (six) hours as needed for nausea. 08/11/20   Loletha Grayer, MD  pantoprazole (PROTONIX) 40 MG tablet Take 1 tablet (40 mg total) by mouth  daily. 08/12/20   Loletha Grayer, MD    Allergies Dextromethorphan-guaifenesin and Propofol  Family History  Problem Relation Age of Onset  . Hypertension Mother   . Hyperlipidemia Mother     Social History Social History   Tobacco Use  . Smoking status: Never Smoker  . Smokeless tobacco: Never Used  Substance Use Topics  . Alcohol use: No  . Drug use: No    Review of Systems  Constitutional: Positive fever/chills Eyes: No visual changes. ENT: No sore throat. Respiratory: Positive for shortness of breath and mild cough Cardiovascular: Denies chest pain Gastrointestinal: Denies abdominal pain Genitourinary: Negative for dysuria. Musculoskeletal: Negative for back pain. Skin: Negative for rash. Psychiatric: no mood changes,      ____________________________________________   PHYSICAL EXAM:  VITAL SIGNS: ED Triage Vitals  Enc Vitals Group     BP 02/10/21 1335 (!) 191/63     Pulse Rate 02/10/21 1335 81     Resp 02/10/21 1335 (!) 26     Temp 02/10/21 1335 97.8 F (36.6 C)     Temp Source 02/10/21 1335 Oral     SpO2 02/10/21 1335 98 %     Weight 02/10/21 1346 100 lb (45.4 kg)     Height 02/10/21 1346 '4\' 11"'$  (1.499 m)     Head Circumference --      Peak Flow --      Pain Score 02/10/21 1345 0     Pain Loc --      Pain Edu? --      Excl. in [place]? --     Constitutional: Alert and oriented. Well appearing and in no acute distress. Eyes: Conjunctivae are normal.  Head: Atraumatic. Nose: No congestion/rhinnorhea. Mouth/Throat: Mucous membranes are moist.   Neck:  supple no lymphadenopathy noted Cardiovascular: Normal rate, regular rhythm. Heart sounds are normal Respiratory: Normal respiratory effort.  No retractions, lungs decreased bilaterally, no wheezing Abd: soft nontender bs normal all 4 quad GU: deferred Musculoskeletal: FROM all extremities, warm and well perfused Neurologic:  Normal speech and language.  Skin:  Skin is warm, dry and intact. No rash noted. Psychiatric: Mood and affect are normal. Speech and behavior are normal.  ____________________________________________   LABS (all labs ordered are listed, but only abnormal results are displayed)  Labs Reviewed  CBC - Abnormal; Notable for the following components:      Result Value   RBC 3.57 (*)    Hemoglobin 11.4 (*)    HCT 34.1 (*)    All other components within normal limits  BASIC METABOLIC PANEL - Abnormal; Notable for the following components:   Glucose, Bld 269 (*)    Creatinine, Ser 2.86 (*)    Calcium 8.3 (*)    GFR, Estimated 18 (*)    All other components within normal limits  RESP PANEL BY RT-PCR (FLU A&B, COVID) ARPGX2  HEPATIC FUNCTION PANEL  BLOOD GAS, VENOUS  TROPONIN I (HIGH SENSITIVITY)  TROPONIN I  (HIGH SENSITIVITY)   ____________________________________________   ____________________________________________  RADIOLOGY  Chest x-ray CTA for PE  ____________________________________________   PROCEDURES  Procedure(s) performed: No  Procedures    ____________________________________________   INITIAL IMPRESSION / ASSESSMENT AND PLAN / ED COURSE  Pertinent labs & imaging results that were available during my care of the patient were reviewed by me and considered in my medical decision making (see chart for details).   Patient is a 65 year old female presents to the emergency department with shortness of breath.  See HPI.  Physical exam shows patient to appear stable.  O2 saturation on room air was 87% and she is at 94 to 98% on 2 L nasal cannula.  DDx: Respiratory failure, Covid, CAP, CHF, PE  CBC has decreased H&H of 11.4 34.1, hepatic panel is normal, basic metabolic panel has increased creatinine 2.86 and glucose of 269.  These labs appear to be stable especially due to her recently having dialysis earlier today.  Troponin and Covid test are both pending  Chest x-ray reviewed by me confirmed by radiology shows some pleural effusions, some cardiomegaly  CTA for PE  CTA for PE shows pulmonary edema, atelectasis, and effusions, no PE  The patient is hypoxic on room air.  We did a trial of room air and she dropped back into the low 70s, she was placed back on oxygen 2 L by nasal cannula  Paged hospitalist for admission due to hypoxia along with respiratory distress    Dr. Posey Pronto, for hospital admission.  Patient will be admitted.  Anita Contreras was evaluated in Emergency Department on 02/10/2021 for the symptoms described in the history of present illness. She was evaluated in the context of the global COVID-19 pandemic, which necessitated consideration that the patient might be at risk for infection with the SARS-CoV-2 virus that causes COVID-19.  Institutional protocols and algorithms that pertain to the evaluation of patients at risk for COVID-19 are in a state of rapid change based on information released by regulatory bodies including the CDC and federal and state organizations. These policies and algorithms were followed during the patient's care in the ED.    As part of my medical decision making, I reviewed the following data within the [place] History obtained from family, Nursing notes reviewed and incorporated, Interpreter needed, Labs reviewed , EKG interpreted , Old chart reviewed, Radiograph reviewed , Discussed with admitting physician , Evaluated by EM attending , Notes from prior ED visits and [place] Controlled Substance Database  ____________________________________________   FINAL CLINICAL IMPRESSION(S) / ED DIAGNOSES  Final diagnoses:  Acute pulmonary edema ([place])  Acute respiratory distress  Hypoxia      NEW MEDICATIONS STARTED DURING THIS VISIT:  New Prescriptions   No medications on file     Note:  This document was prepared using Dragon voice recognition software and may include unintentional dictation errors.    Versie Starks, PA-C 02/10/21 Dayna Barker, MD 02/10/21 [phone]

## 2021-02-10 NOTE — H&P (Signed)
History and Physical    Anita Contreras [id number] DOB: 1956/01/09 DOA: 02/10/2021  PCP: Frazier Richards, MD    Patient coming from:  Home   Chief Complaint:  Shortness of breath   HPI: Anita Contreras is a 65 y.o. female been in the emergency room with complaints of shortness of breath.  Per report shortness of breath has been going on since Thursday with no cough no chest pain.  Patient is a dialysis patient and her scheduled dialysis days are Tuesday Thursday and Saturday.  She was sent from dialysis for being short of breath patient did complete her session today.  On arrival patient was noted to be 87% O2 saturation on room air she was started on 2 L and her sats increased to 94%.  Patient had COVID-19 infection few weeks ago as well.Today sob started 2 hours ago after she finished her HD session.  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  Allergies reviewed patient is allergic to dextromethorphan, guaifenesin and propofol.  No history of tobacco alcohol or drugs.   ED Course:  Vitals:   02/10/21 1600 02/10/21 1803 02/10/21 1815 02/10/21 1830  BP: (!) 194/71 (!) 184/66  (!) 193/70  Pulse: 73 72 77 73  Resp: 18   20  Temp:      TempSrc:      SpO2: 100% (!) 86% 96% 98%  Weight:      Height:      In the emergency room patient is alert awake oriented, afebrile temperature 97.8, blood pressure 191/63, respiratory rate of 26 oxygenation at 98% on 2 L nasal cannula. Blood work shows hemoglobin of 11.4, serum creatinine of 2.86, glucose of 269 otherwise within normal limits.  Interpreter was used as at bedside.  Per report patient felt like she had a fever but she had not checked it.  In the emergency room patient was  started on 2 L nasal cannula, CTA of the chest done which was negative for pulmonary embolism, patient has moderate right and small left pleural effusions associated with compressive atelectasis, pulmonary edema, prominent mediastinal lymph nodes likely reactive per CT report, cardiomegaly with small pericardial effusion.  Patient's last echocardiogram was in February 2018 which showed left ventricular ejection fraction of 65 to 70%, with grade 1 diastolic dysfunction.  Blood gases pending.  Covid serology report is pending.   Review of Systems:  Review of Systems  Reason unable to perform ROS: ROS negative using Spanish interpreter.  Constitutional: Negative.   HENT: Positive for sore throat.   Eyes: Positive for blurred vision.       BL cataract.  Respiratory: Positive for shortness of breath.   Cardiovascular: Negative.   Gastrointestinal: Positive for heartburn.  Genitourinary: Negative.   Musculoskeletal: Negative.   Neurological: Negative.      Past Medical History:  Diagnosis Date  . Acute cystitis with hematuria   . Anemia   . Cataracts, bilateral   . CKD (chronic kidney disease)   . Diabetic retinopathy, background ([place])   . Dialysis patient ([place])    Tues, Thurs, Sat  . Dizziness   . Hyperlipidemia   . Hypertension   . Migraine variant   . Neuropathy   . OP (osteoporosis)   . Personal history of noncompliance with medical treatment, presenting hazards to health   . Steal  syndrome as complication of dialysis access ([place])   . Stroke ([place])   . Type II diabetes mellitus with ophthalmic manifestations, uncontrolled ([place])   . Vitamin D deficiency     Past Surgical History:  Procedure Laterality Date  . A/V FISTULAGRAM Right 06/03/2018   Procedure: A/V FISTULAGRAM;  Surgeon: Algernon Huxley, MD;  Location: [place] [facility];  Service: Cardiovascular;  Laterality: Right;  . A/V FISTULAGRAM Right 02/22/2019   Procedure: A/V FISTULAGRAM;  Surgeon: Algernon Huxley, MD;   Location: [place] [facility];  Service: Cardiovascular;  Laterality: Right;  . A/V SHUNT INTERVENTION N/A 01/26/2018   Procedure: A/V SHUNT INTERVENTION;  Surgeon: Algernon Huxley, MD;  Location: [place] [facility];  Service: Cardiovascular;  Laterality: N/A;  . A/V SHUNTOGRAM Right 01/26/2018   Procedure: A/V SHUNTOGRAM;  Surgeon: Algernon Huxley, MD;  Location: [place] [facility];  Service: Cardiovascular;  Laterality: Right;  . AV FISTULA PLACEMENT Right 10/10/2016   Procedure: ARTERIOVENOUS (AV) FISTULA CREATION ( BRACHIOCEPHALIC );  Surgeon: Algernon Huxley, MD;  Location: [facility];  Service: Vascular;  Laterality: Right;  . DILATION AND CURETTAGE OF UTERUS    . DISTAL REVASCULARIZATION AND INTERVAL LIGATION (DRIL) Right 11/08/2016   Procedure: DISTAL REVASCULARIZATION AND INTERVAL LIGATION (DRIL);  Surgeon: Algernon Huxley, MD;  Location: [facility];  Service: Vascular;  Laterality: Right;  . PERIPHERAL VASCULAR CATHETERIZATION N/A 04/25/2016   Procedure: Dialysis/Perma Catheter Insertion;  Surgeon: Algernon Huxley, MD;  Location: [place] [facility];  Service: Cardiovascular;  Laterality: N/A;  . PERIPHERAL VASCULAR CATHETERIZATION Right 10/17/2016   Procedure: Upper Extremity Angiography;  Surgeon: Algernon Huxley, MD;  Location: [place] [facility];  Service: Cardiovascular;  Laterality: Right;  . UPPER EXTREMITY ANGIOGRAPHY Right 10/12/2018   Procedure: UPPER EXTREMITY ANGIOGRAPHY;  Surgeon: Algernon Huxley, MD;  Location: [place] [facility];  Service: Cardiovascular;  Laterality: Right;     reports that she has never smoked. She has never used smokeless tobacco. She reports that she does not drink alcohol and does not use drugs.  Allergies  Allergen Reactions  . Dextromethorphan-Guaifenesin     Other reaction(s): Itching of skin (finding)  . Propofol Itching    Pt says " when getting anesthesia by body itches" unsure what type of anesthesia    Family History  Problem Relation Age of Onset  .  Hypertension Mother   . Hyperlipidemia Mother     Prior to Admission medications   Medication Sig Start Date End Date Taking? Authorizing Provider  acetaminophen (TYLENOL) 500 MG tablet Take 1,000 mg by mouth every 6 (six) hours as needed for mild pain or headache.    [provider]  amLODipine (NORVASC) 10 MG tablet Take 1 tablet (10 mg total) by mouth daily. 01/27/18   Bettey Costa, MD  aspirin 81 MG EC tablet TAKE 1 TABLET BY MOUTH EVERY DAY 04/15/17   Stegmayer, Joelene Millin A, PA-C  atorvastatin (LIPITOR) 40 MG tablet Take 40 mg by mouth daily at 6 PM.     [provider]  azithromycin (ZITHROMAX) 250 MG tablet Take 1 tablet (250 mg total) by mouth daily. 08/12/20   Loletha Grayer, MD  calcium acetate (PHOSLO) 667 MG capsule Take 1 capsule (667 mg total) by mouth 3 (three) times daily with meals. 04/19/16   Fritzi Mandes, MD  dexamethasone (DECADRON) 2 MG tablet Two tabs po daily for three days then one tab po daily for two days then stop  08/11/20   Loletha Grayer, MD  furosemide (LASIX) 20 MG tablet Take 20 mg by mouth daily.    [provider]  hydrOXYzine (ATARAX/VISTARIL) 25 MG tablet Take 25 mg by mouth 2 (two) times daily as needed for itching.    [provider]  insulin NPH-regular Human (70-30) 100 UNIT/ML injection Inject 3 Units into the skin 2 (two) times daily with a meal.    [provider]  lidocaine-prilocaine (EMLA) cream Apply 1 application topically as needed.    [provider]  losartan (COZAAR) 50 MG tablet Take 1 tablet (50 mg total) by mouth daily. 01/27/18   Bettey Costa, MD  methadone (DOLOPHINE) 5 MG tablet TAKE 1/2 TABLET BY MOUTH EVERY SIX HOURS AS NEEDED FOR PAIN 07/06/20   [provider]  ondansetron (ZOFRAN) 4 MG tablet Take 1 tablet (4 mg total) by mouth every 6 (six) hours as needed for nausea. 08/11/20   Loletha Grayer, MD  pantoprazole (PROTONIX) 40 MG tablet Take 1 tablet (40 mg total) by mouth daily.  08/12/20   Loletha Grayer, MD    Physical Exam: Vitals:   02/10/21 1600 02/10/21 1803 02/10/21 1815 02/10/21 1830  BP: (!) 194/71 (!) 184/66  (!) 193/70  Pulse: 73 72 77 73  Resp: 18   20  Temp:      TempSrc:      SpO2: 100% (!) 86% 96% 98%  Weight:      Height:        Physical Exam Vitals and nursing note reviewed.  Constitutional:      General: She is not in acute distress.    Appearance: Normal appearance.     Interventions: Nasal cannula in place.  HENT:     Head: Normocephalic and atraumatic.     Right Ear: External ear normal.     Left Ear: External ear normal.     Mouth/Throat:     Mouth: Mucous membranes are moist.  Eyes:     Extraocular Movements: Extraocular movements intact.     Pupils: Pupils are equal, round, and reactive to light.  Neck:     Vascular: No carotid bruit.  Cardiovascular:     Rate and Rhythm: Normal rate and regular rhythm.     Pulses: Normal pulses.     Heart sounds: Normal heart sounds.  Pulmonary:     Effort: Pulmonary effort is normal.     Breath sounds: Normal breath sounds.  Abdominal:     General: Bowel sounds are normal. There is no distension.     Palpations: Abdomen is soft. There is no mass.     Tenderness: There is no abdominal tenderness. There is no guarding.     Hernia: No hernia is present.  Musculoskeletal:     Right lower leg: No edema.     Left lower leg: No edema.  Neurological:     General: No focal deficit present.     Mental Status: She is alert and oriented to person, place, and time.  Psychiatric:        Mood and Affect: Mood normal.        Behavior: Behavior normal.     Labs on Admission: I have personally reviewed following labs and imaging studies No results for input(s): CKTOTAL, CKMB, TROPONINI in the last 72 hours. Lab Results  Component Value Date   WBC 4.6 02/10/2021   HGB 11.4 (L) 02/10/2021   HCT 34.1 (L) 02/10/2021   MCV 95.5 02/10/2021   PLT  166 02/10/2021    Recent Labs  Lab  02/10/21 1340  NA 139  K 3.5  CL 99  CO2 28  BUN 11  CREATININE 2.86*  CALCIUM 8.3*  PROT 7.1  BILITOT 1.1  ALKPHOS 120  ALT 17  AST 30  GLUCOSE 269*   Lab Results  Component Value Date   CHOL 124 04/18/2016   HDL 41 04/18/2016   LDLCALC 51 04/18/2016   TRIG 162 (H) 04/18/2016   No results found for: DDIMER Invalid input(s): POCBNP  Urinalysis    Component Value Date/Time   COLORURINE YELLOW (A) 08/07/2020 0858   APPEARANCEUR CLEAR (A) 08/07/2020 0858   LABSPEC 1.013 08/07/2020 0858   PHURINE 8.0 08/07/2020 0858   GLUCOSEU NEGATIVE 08/07/2020 0858   HGBUR NEGATIVE 08/07/2020 0858   BILIRUBINUR NEGATIVE 08/07/2020 0858   KETONESUR NEGATIVE 08/07/2020 0858   PROTEINUR >=300 (A) 08/07/2020 0858   NITRITE NEGATIVE 08/07/2020 0858   LEUKOCYTESUR NEGATIVE 08/07/2020 0858   COVID-19 Labs Pending.  Lab Results  Component Value Date   SARSCOV2NAA NEGATIVE 02/10/2021   SARSCOV2NAA POSITIVE (A) 08/07/2020    Radiological Exams on Admission: DG Chest 2 View  Result Date: 02/10/2021 CLINICAL DATA:  Shortness of breath. EXAM: CHEST - 2 VIEW COMPARISON:  August 07, 2020 FINDINGS: A vascular stent is seen in the right upper extremity extending into the axilla. No pneumothorax. Stable cardiomegaly. The hila and mediastinum are unremarkable. Small bilateral pleural effusions. Increased interstitial markings bilaterally are improved compared to August 07, 2020. IMPRESSION: Cardiomegaly and mild edema.  Small bilateral pleural effusions. Electronically Signed   By: Dorise Bullion III M.D   On: 02/10/2021 14:20   CT Angio Chest PE W and/or Wo Contrast  Result Date: 02/10/2021 CLINICAL DATA:  Shortness of breath EXAM: CT ANGIOGRAPHY CHEST WITH CONTRAST TECHNIQUE: Multidetector CT imaging of the chest was performed using the standard protocol during bolus administration of intravenous contrast. Multiplanar CT image reconstructions and MIPs were obtained to evaluate the vascular  anatomy. CONTRAST:  84m OMNIPAQUE IOHEXOL 350 MG/ML SOLN COMPARISON:  February 24, 2018 FINDINGS: Cardiovascular: Satisfactory opacification of the pulmonary arteries to the segmental level. No evidence of pulmonary embolism. Cardiomegaly. Small pericardial effusion. LEFT-sided coronary artery atherosclerotic calcifications. Moderate atherosclerotic calcifications of the aorta. LEFT vertebral artery arises directly from the aorta. Mediastinum/Nodes: Thyroid is unremarkable. No axillary adenopathy. Prominent mediastinal lymph nodes. Lymph node of the AP window measures 10 mm in the short axis (series 5, image 82). Subcarinal lymph node measures 16 mm in the short axis (series 5, image 111). Lungs/Pleura: Moderate RIGHT and small LEFT pleural effusion. No pneumothorax. Enhancing bibasilar consolidative opacities most consistent with atelectasis. Interlobular septal thickening and centrilobular ground-glass opacities. Bronchial wall thickening. Upper Abdomen: No acute abnormality. Musculoskeletal: No acute osseous abnormality. Diffuse mild sclerosis likely reflecting underlying renal osteodystrophy. Review of the MIP images confirms the above findings. IMPRESSION: 1. No evidence of acute pulmonary embolism. 2. Moderate RIGHT and small LEFT pleural effusions with associated compressive atelectasis. 3. Pulmonary edema. 4. Prominent mediastinal lymph nodes, likely reactive. 5. Cardiomegaly with small pericardial effusion. Aortic Atherosclerosis (ICD10-I70.0). Electronically Signed   By: SValentino SaxonMD   On: 02/10/2021 17:33    EKG: Independently reviewed.  SR 80 with prolonged qtc 493.    Assessment/Plan Principal Problem:   Acute respiratory failure with hypoxia (HCC) Active Problems:   ESRD on dialysis ([facility]   Essential hypertension   Type 2 diabetes mellitus with ESRD (end-stage renal disease) [place]  A/c respiratory failure with hypoxia: -Admit to Progressive cardiac unit with continuous  monitoring.  PT/INR pending. -Left pleural effusion we will get usg guided thoracentesis in am. -Supplemental oxygen.  -2 D Echo.   ESRD ON HD: -consult nephrology for dialysis.  -renal diet.   HTN: -Home regimen of amlodipine continued, along with Cozaar. -Sodium restricted heart healthy diet.  Diabetes mellitus type 2: -Sliding scale insulin, Accu-Cheks, A1c, hypoglycemia protocol. -Carb consistent diet.    DVT prophylaxis:  Heparin   Code Status:  Full Code  Family Communication:  Therisa Doyne (Daughter)  [phone] (Mobile)   Disposition Plan:  Home  Consults called:  None   Admission status: Inpatient.    Para Skeans MD [facility] [phone] How to contact the [facility] Attending or Consulting provider .  1. Check the care team in [facility] and look for a) attending/consulting [facility] provider listed and b) the [facility] team listed 2. Log into [email or web address] and use [place] universal password to access. If you do not have the password, please contact the hospital operator. 3. Locate the [facility] provider you are looking for under [facility] and page to a number that you can be directly reached. 4. If you still have difficulty reaching the provider, please page the [facility] (Director on Call) for the Hospitalists listed on amion for assistance. [email or web address] Password [facility] 02/10/2021, 7:16 PM

## 2021-02-10 NOTE — ED Notes (Signed)
On arrival to the ED room, pt O2 saturation was 87%. Pt placed on 2L [place], increased to 94% at this time. Elsie Amis, primary RN made aware.

## 2021-02-11 ENCOUNTER — Inpatient Hospital Stay (HOSPITAL_COMMUNITY)
Admission: EM | Admit: 2021-02-11 | Discharge: 2021-02-11 | Disposition: A | Discharge status: Home / Self Care | Payer: Medicaid Other | Source: Home / Self Care | Attending: Internal Medicine | Admitting: Internal Medicine

## 2021-02-11 DIAGNOSIS — R0602 Shortness of breath: Secondary | ICD-10-CM

## 2021-02-11 DIAGNOSIS — J9601 Acute respiratory failure with hypoxia: Secondary | ICD-10-CM

## 2021-02-11 LAB — C-REACTIVE PROTEIN: CRP: 0.5 mg/dL (ref <1.0)

## 2021-02-11 LAB — MRSA PCR SCREENING: MRSA by PCR: NEGATIVE

## 2021-02-11 MED ORDER — CHLORHEXIDINE GLUCONATE CLOTH 2 % EX PADS
6.0000 | MEDICATED_PAD | Freq: Every day | CUTANEOUS | Status: DC
  Administered 2021-02-11: 6 via TOPICAL

## 2021-02-11 NOTE — Progress Notes (Signed)
Patient discharged at this time to home with granddaughter with all belongings. Patient verbalized understanding of discharge instructions. PIV removed. NAD noted upon departure.

## 2021-02-11 NOTE — Progress Notes (Signed)
*  PRELIMINARY RESULTS* Echocardiogram 2D Echocardiogram has been performed.  Jonette Mate Janice Bodine 02/11/2021, 1:22 PM

## 2021-02-11 NOTE — Discharge Summary (Signed)
Physician Discharge Summary   Anita Contreras  female DOB: 11-27-1956  RK:[id number]  PCP: Frazier Richards, MD  Admit date: 02/10/2021 Discharge date: 02/11/2021  Admitted From: home Disposition:  Home Daughter updated on discharge plans prior to discharge.  CODE STATUS: Full code    30 Day Unplanned Readmission Risk Score   Flowsheet Row ED to Hosp-Admission (Current) from 02/10/2021 in [facility]  30 Day Unplanned Readmission Risk Score (%) 19.9 Filed at 02/11/2021 0801     This score is the patient's risk of an unplanned readmission within 30 days of being discharged (0 -100%). The score is based on dignosis, age, lab data, medications, orders, and past utilization.   Low:  0-14.9   Medium: 15-21.9   High: 22-29.9   Extreme: 30 and above         Hospital Course:  For full details, please see H&P, progress notes, consult notes and ancillary notes.  Briefly,  Anita Contreras is a 65 y.o. 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    On arrival patient was noted to be 87% O2 saturation on room air she was started on 2 L and her sats increased to 94%.  Patient had COVID-19 infection few weeks ago as well, tested neg on presentation.  Acute respiratory failure with hypoxia 2/2 Bilateral pleural effusion and pulm edema CTA chest neg for PE.  Pt's dyspnea improved the next day even before dialysis.  Pt received dialysis on Sunday before discharge home.  Echo done, not yet read.  ESRD on HD TTS consulted nephrology for dialysis. Pt received dialysis on Sunday before discharge home to resume regular outpatient  schedule.  HTN: Continued home amlodipine and Cozaar.  BP remained elevated, which may be contributed by volume overload.  Will defer to outpatient provider for further BP med adjustment.  Diabetes mellitus type 2: Pt reportedly was not taking insulin or diabetic meds prior to presentation.  Discharged on prior regimen of 70/30  3u BID with meals.   Discharge Diagnoses:  Principal Problem:   Acute respiratory failure with hypoxia (HCC) Active Problems:   End stage renal disease (HCC)   Essential hypertension   Type 2 diabetes mellitus with ESRD (end-stage renal disease) (HCC)   GERD (gastroesophageal reflux disease)   Respiratory failure with hypoxia ([place])    Discharge Instructions:  Allergies as of 02/11/2021      Reactions   Dextromethorphan-guaifenesin    Other reaction(s): Itching of skin (finding)   Propofol Itching   Pt says " when getting anesthesia by body itches" unsure what type of anesthesia      Medication List    STOP taking these medications   azithromycin 250 MG tablet Commonly known as: ZITHROMAX   dexamethasone 2 MG tablet Commonly known as: DECADRON   ondansetron 4 MG tablet Commonly known as: ZOFRAN     TAKE these medications   acetaminophen 500 MG tablet Commonly known as: TYLENOL Take 1,000 mg by mouth every 6 (six) hours as needed for mild pain or headache.   amLODipine 10 MG tablet Commonly known as: NORVASC Take 1 tablet (10 mg total) by mouth daily.   aspirin 81 MG EC tablet TAKE 1 TABLET BY MOUTH EVERY DAY   atorvastatin 40  MG tablet Commonly known as: LIPITOR Take 40 mg by mouth daily at 6 PM.   calcium acetate 667 MG capsule Commonly known as: PHOSLO Take 1 capsule (667 mg total) by mouth 3 (three) times daily with meals.   furosemide 20 MG tablet Commonly known as: LASIX Take 20 mg by mouth daily.   hydrOXYzine 25 MG tablet Commonly known as: ATARAX/VISTARIL Take 25 mg by mouth 2 (two) times daily as needed for  itching.   insulin NPH-regular Human (70-30) 100 UNIT/ML injection Inject 3 Units into the skin 2 (two) times daily with a meal.   lidocaine-prilocaine cream Commonly known as: EMLA Apply 1 application topically as needed.   losartan 50 MG tablet Commonly known as: COZAAR Take 1 tablet (50 mg total) by mouth daily.   pantoprazole 40 MG tablet Commonly known as: PROTONIX Take 1 tablet (40 mg total) by mouth daily.        Follow-up Information    Frazier Richards, MD. Schedule an appointment as soon as possible for a visit in 1 week(s).   Specialty: Family Medicine Contact information: [place] [address] [phone]               Allergies  Allergen Reactions  . Dextromethorphan-Guaifenesin     Other reaction(s): Itching of skin (finding)  . Propofol Itching    Pt says " when getting anesthesia by body itches" unsure what type of anesthesia     The results of significant diagnostics from this hospitalization (including imaging, microbiology, ancillary and laboratory) are listed below for reference.   Consultations:   Procedures/Studies: DG Chest 2 View  Result Date: 02/10/2021 CLINICAL DATA:  Shortness of breath. EXAM: CHEST - 2 VIEW COMPARISON:  August 07, 2020 FINDINGS: A vascular stent is seen in the right upper extremity extending into the axilla. No pneumothorax. Stable cardiomegaly. The hila and mediastinum are unremarkable. Small bilateral pleural effusions. Increased interstitial markings bilaterally are improved compared to August 07, 2020. IMPRESSION: Cardiomegaly and mild edema.  Small bilateral pleural effusions. Electronically Signed   By: Dorise Bullion III M.D   On: 02/10/2021 14:20   CT Angio Chest PE W and/or Wo Contrast  Result Date: 02/10/2021 CLINICAL DATA:  Shortness of breath EXAM: CT ANGIOGRAPHY CHEST WITH CONTRAST TECHNIQUE: Multidetector CT imaging of the chest was performed using the standard protocol during bolus  administration of intravenous contrast. Multiplanar CT image reconstructions and MIPs were obtained to evaluate the vascular anatomy. CONTRAST:  55m OMNIPAQUE IOHEXOL 350 MG/ML SOLN COMPARISON:  February 24, 2018 FINDINGS: Cardiovascular: Satisfactory opacification of the pulmonary arteries to the segmental level. No evidence of pulmonary embolism. Cardiomegaly. Small pericardial effusion. LEFT-sided coronary artery atherosclerotic calcifications. Moderate atherosclerotic calcifications of the aorta. LEFT vertebral artery arises directly from the aorta. Mediastinum/Nodes: Thyroid is unremarkable. No axillary adenopathy. Prominent mediastinal lymph nodes. Lymph node of the AP window measures 10 mm in the short axis (series 5, image 82). Subcarinal lymph node measures 16 mm in the short axis (series 5, image 111). Lungs/Pleura: Moderate RIGHT and small LEFT pleural effusion. No pneumothorax. Enhancing bibasilar consolidative opacities most consistent with atelectasis. Interlobular septal thickening and centrilobular ground-glass opacities. Bronchial wall thickening. Upper Abdomen: No acute abnormality. Musculoskeletal: No acute osseous abnormality. Diffuse mild sclerosis likely reflecting underlying renal osteodystrophy. Review of the MIP images confirms the above findings. IMPRESSION: 1. No evidence of acute pulmonary embolism. 2. Moderate RIGHT and small LEFT pleural effusions with associated compressive atelectasis. 3. Pulmonary edema.  4. Prominent mediastinal lymph nodes, likely reactive. 5. Cardiomegaly with small pericardial effusion. Aortic Atherosclerosis (ICD10-I70.0). Electronically Signed   By: Valentino Saxon MD   On: 02/10/2021 17:33      Labs: BNP (last 3 results) No results for input(s): BNP in the last 8760 hours. Basic Metabolic Panel: Recent Labs  Lab 02/10/21 1340  NA 139  K 3.5  CL 99  CO2 28  GLUCOSE 269*  BUN 11  CREATININE 2.86*  CALCIUM 8.3*   Liver Function Tests: Recent  Labs  Lab 02/10/21 1340  AST 30  ALT 17  ALKPHOS 120  BILITOT 1.1  PROT 7.1  ALBUMIN 4.1   No results for input(s): LIPASE, AMYLASE in the last 168 hours. No results for input(s): AMMONIA in the last 168 hours. CBC: Recent Labs  Lab 02/10/21 1340  WBC 4.6  HGB 11.4*  HCT 34.1*  MCV 95.5  PLT 166   Cardiac Enzymes: No results for input(s): CKTOTAL, CKMB, CKMBINDEX, TROPONINI in the last 168 hours. BNP: Invalid input(s): POCBNP CBG: Recent Labs  Lab 02/10/21 2118  GLUCAP 123*   D-Dimer No results for input(s): DDIMER in the last 72 hours. Hgb A1c No results for input(s): HGBA1C in the last 72 hours. Lipid Profile No results for input(s): CHOL, HDL, LDLCALC, TRIG, CHOLHDL, LDLDIRECT in the last 72 hours. Thyroid function studies No results for input(s): TSH, T4TOTAL, T3FREE, THYROIDAB in the last 72 hours.  Invalid input(s): FREET3 Anemia work up No results for input(s): VITAMINB12, FOLATE, FERRITIN, TIBC, IRON, RETICCTPCT in the last 72 hours. Urinalysis    Component Value Date/Time   COLORURINE YELLOW (A) 08/07/2020 0858   APPEARANCEUR CLEAR (A) 08/07/2020 0858   LABSPEC 1.013 08/07/2020 0858   PHURINE 8.0 08/07/2020 0858   GLUCOSEU NEGATIVE 08/07/2020 0858   HGBUR NEGATIVE 08/07/2020 0858   BILIRUBINUR NEGATIVE 08/07/2020 0858   KETONESUR NEGATIVE 08/07/2020 0858   PROTEINUR >=300 (A) 08/07/2020 0858   NITRITE NEGATIVE 08/07/2020 0858   LEUKOCYTESUR NEGATIVE 08/07/2020 0858   Sepsis Labs Invalid input(s): PROCALCITONIN,  WBC,  LACTICIDVEN Microbiology Recent Results (from the past 240 hour(s))  Resp Panel by RT-PCR (Flu A&B, Covid) Nasopharyngeal Swab     Status: None   Collection Time: 02/10/21  4:20 PM   Specimen: Nasopharyngeal Swab; Nasopharyngeal(NP) swabs in vial transport medium  Result Value Ref Range Status   SARS Coronavirus 2 by RT PCR NEGATIVE NEGATIVE Final    Comment: (NOTE) SARS-CoV-2 target nucleic acids are NOT DETECTED.  The  SARS-CoV-2 RNA is generally detectable in upper respiratory specimens during the acute phase of infection. The lowest concentration of SARS-CoV-2 viral copies this assay can detect is 138 copies/mL. A negative result does not preclude SARS-Cov-2 infection and should not be used as the sole basis for treatment or other patient management decisions. A negative result may occur with  improper specimen collection/handling, submission of specimen other than nasopharyngeal swab, presence of viral mutation(s) within the areas targeted by this assay, and inadequate number of viral copies(<138 copies/mL). A negative result must be combined with clinical observations, patient history, and epidemiological information. The expected result is Negative.  Fact Sheet for Patients:  EntrepreneurPulse.com.au  Fact Sheet for Healthcare Providers:  IncredibleEmployment.be  This test is no t yet approved or cleared by the [place] FDA and  has been authorized for detection and/or diagnosis of SARS-CoV-2 by FDA under an Emergency Use Authorization (EUA). This EUA will remain  in effect (meaning this test can be used)  for the duration of the COVID-19 declaration under Section 564(b)(1) of the Act, 21 U.S.C.section 360bbb-3(b)(1), unless the authorization is terminated  or revoked sooner.       Influenza A by PCR NEGATIVE NEGATIVE Final   Influenza B by PCR NEGATIVE NEGATIVE Final    Comment: (NOTE) The Xpert Xpress SARS-CoV-2/FLU/RSV plus assay is intended as an aid in the diagnosis of influenza from Nasopharyngeal swab specimens and should not be used as a sole basis for treatment. Nasal washings and aspirates are unacceptable for Xpert Xpress SARS-CoV-2/FLU/RSV testing.  Fact Sheet for Patients: EntrepreneurPulse.com.au  Fact Sheet for Healthcare Providers: IncredibleEmployment.be  This test is not yet approved or  cleared by the [place] FDA and has been authorized for detection and/or diagnosis of SARS-CoV-2 by FDA under an Emergency Use Authorization (EUA). This EUA will remain in effect (meaning this test can be used) for the duration of the COVID-19 declaration under Section 564(b)(1) of the Act, 21 U.S.C. section 360bbb-3(b)(1), unless the authorization is terminated or revoked.  Performed at [facility], [address] [place], [place] [address]   MRSA PCR Screening     Status: None   Collection Time: 02/11/21  5:30 AM   Specimen: Nasopharyngeal  Result Value Ref Range Status   MRSA by PCR NEGATIVE NEGATIVE Final    Comment:        The GeneXpert MRSA Assay (FDA approved for NASAL specimens only), is one component of a comprehensive MRSA colonization surveillance program. It is not intended to diagnose MRSA infection nor to guide or monitor treatment for MRSA infections. Performed at [facility], [address] [place], [place] [address]      Total time spend on discharging this patient, including the last patient exam, discussing the hospital stay, instructions for ongoing care as it relates to all pertinent caregivers, as well as preparing the medical discharge records, prescriptions, and/or referrals as applicable, is 50 minutes.    Enzo Bi, MD  [facility] 02/11/2021, 10:43 AM

## 2021-02-11 NOTE — Progress Notes (Signed)
[facility]  ROUNDING NOTE   Subjective:   Anita Contreras was admitted to [facility] on 02/10/2021 for Acute respiratory distress [R06.03] Acute pulmonary edema ([place]) [J81.0] Hypoxia [R09.02] Respiratory failure with hypoxia ([place]) [J96.91]  Last hemodialysis treatment was yesterday  Patient was found to have pulmonary edema and nephrology consulted for an extra treatment today.   History taken with assistance of Spanish Interpreter.   Objective:  Vital signs in last 24 hours:  Temp:  [97.8 F (36.6 C)-98.6 F (37 C)] 98.2 F (36.8 C) (03/06 0813) Pulse Rate:  [68-81] 75 (03/06 0813) Resp:  [18-26] 18 (03/06 0813) BP: (179-194)/(59-71) 188/59 (03/06 0813) SpO2:  [86 %-100 %] 98 % (03/06 0813) Weight:  [45.4 kg-50.9 kg] 50.9 kg (03/06 0100)  Weight change:  Filed Weights   02/10/21 1346 02/10/21 2109 02/11/21 0100  Weight: 45.4 kg 50.9 kg 50.9 kg    Intake/Output: No intake/output data recorded.   Intake/Output this shift:  No intake/output data recorded.  Physical Exam: General: NAD, laying in bed  Head: Normocephalic, atraumatic. Moist oral mucosal membranes  Eyes: Anicteric, PERRL  Neck: Supple, trachea midline  Lungs:  Bilateral crackles  Heart: Regular rate and rhythm  Abdomen:  Soft, nontender,   Extremities: trace peripheral edema.  Neurologic: Nonfocal, moving all four extremities  Skin: No lesions  Access: Right AVF    Basic Metabolic Panel: Recent Labs  Lab 02/10/21 1340  NA 139  K 3.5  CL 99  CO2 28  GLUCOSE 269*  BUN 11  CREATININE 2.86*  CALCIUM 8.3*    Liver Function Tests: Recent Labs  Lab 02/10/21 1340  AST 30  ALT 17  ALKPHOS 120  BILITOT 1.1  PROT 7.1  ALBUMIN 4.1   No results for input(s): LIPASE, AMYLASE in the last 168 hours. No results for input(s): AMMONIA in the last 168 hours.  CBC: Recent Labs  Lab 02/10/21 1340  WBC 4.6  HGB 11.4*  HCT 34.1*  MCV 95.5  PLT 166    Cardiac  Enzymes: No results for input(s): CKTOTAL, CKMB, CKMBINDEX, TROPONINI in the last 168 hours.  BNP: Invalid input(s): POCBNP  CBG: Recent Labs  Lab 02/10/21 2118  GLUCAP 123*    Microbiology: Results for orders placed or performed during the hospital encounter of 02/10/21  Resp Panel by RT-PCR (Flu A&B, Covid) Nasopharyngeal Swab     Status: None   Collection Time: 02/10/21  4:20 PM   Specimen: Nasopharyngeal Swab; Nasopharyngeal(NP) swabs in vial transport medium  Result Value Ref Range Status   SARS Coronavirus 2 by RT PCR NEGATIVE NEGATIVE Final    Comment: (NOTE) SARS-CoV-2 target nucleic acids are NOT DETECTED.  The SARS-CoV-2 RNA is generally detectable in upper respiratory specimens during the acute phase of infection. The lowest concentration of SARS-CoV-2 viral copies this assay can detect is 138 copies/mL. A negative result does not preclude SARS-Cov-2 infection and should not be used as the sole basis for treatment or other patient management decisions. A negative result may occur with  improper specimen collection/handling, submission of specimen other than nasopharyngeal swab, presence of viral mutation(s) within the areas targeted by this assay, and inadequate number of viral copies(<138 copies/mL). A negative result must be combined with clinical observations, patient history, and epidemiological information. The expected result is Negative.  Fact Sheet for Patients:  EntrepreneurPulse.com.au  Fact Sheet for Healthcare Providers:  IncredibleEmployment.be  This test is no t yet approved or cleared by the [place] and  has been authorized for detection and/or diagnosis of SARS-CoV-2 by FDA under an Emergency Use Authorization (EUA). This EUA will remain  in effect (meaning this test can be used) for the duration of the COVID-19 declaration under Section 564(b)(1) of the Act, 21 U.S.C.section 360bbb-3(b)(1), unless  the authorization is terminated  or revoked sooner.       Influenza A by PCR NEGATIVE NEGATIVE Final   Influenza B by PCR NEGATIVE NEGATIVE Final    Comment: (NOTE) The Xpert Xpress SARS-CoV-2/FLU/RSV plus assay is intended as an aid in the diagnosis of influenza from Nasopharyngeal swab specimens and should not be used as a sole basis for treatment. Nasal washings and aspirates are unacceptable for Xpert Xpress SARS-CoV-2/FLU/RSV testing.  Fact Sheet for Patients: EntrepreneurPulse.com.au  Fact Sheet for Healthcare Providers: IncredibleEmployment.be  This test is not yet approved or cleared by the [place] FDA and has been authorized for detection and/or diagnosis of SARS-CoV-2 by FDA under an Emergency Use Authorization (EUA). This EUA will remain in effect (meaning this test can be used) for the duration of the COVID-19 declaration under Section 564(b)(1) of the Act, 21 U.S.C. section 360bbb-3(b)(1), unless the authorization is terminated or revoked.  Performed at [facility], [address] [place], [place] [address]   MRSA PCR Screening     Status: None   Collection Time: 02/11/21  5:30 AM   Specimen: Nasopharyngeal  Result Value Ref Range Status   MRSA by PCR NEGATIVE NEGATIVE Final    Comment:        The GeneXpert MRSA Assay (FDA approved for NASAL specimens only), is one component of a comprehensive MRSA colonization surveillance program. It is not intended to diagnose MRSA infection nor to guide or monitor treatment for MRSA infections. Performed at [facility], [address] [place], [place] [address]     Coagulation Studies: No results for input(s): LABPROT, INR in the last 72 hours.  Urinalysis: No results for input(s): COLORURINE, LABSPEC, PHURINE, GLUCOSEU, HGBUR, BILIRUBINUR, KETONESUR, PROTEINUR, UROBILINOGEN, NITRITE, LEUKOCYTESUR in the last 72 hours.  Invalid input(s):  APPERANCEUR    Imaging: DG Chest 2 View  Result Date: 02/10/2021 CLINICAL DATA:  Shortness of breath. EXAM: CHEST - 2 VIEW COMPARISON:  August 07, 2020 FINDINGS: A vascular stent is seen in the right upper extremity extending into the axilla. No pneumothorax. Stable cardiomegaly. The hila and mediastinum are unremarkable. Small bilateral pleural effusions. Increased interstitial markings bilaterally are improved compared to August 07, 2020. IMPRESSION: Cardiomegaly and mild edema.  Small bilateral pleural effusions. Electronically Signed   By: Dorise Bullion III M.D   On: 02/10/2021 14:20   CT Angio Chest PE W and/or Wo Contrast  Result Date: 02/10/2021 CLINICAL DATA:  Shortness of breath EXAM: CT ANGIOGRAPHY CHEST WITH CONTRAST TECHNIQUE: Multidetector CT imaging of the chest was performed using the standard protocol during bolus administration of intravenous contrast. Multiplanar CT image reconstructions and MIPs were obtained to evaluate the vascular anatomy. CONTRAST:  34m OMNIPAQUE IOHEXOL 350 MG/ML SOLN COMPARISON:  February 24, 2018 FINDINGS: Cardiovascular: Satisfactory opacification of the pulmonary arteries to the segmental level. No evidence of pulmonary embolism. Cardiomegaly. Small pericardial effusion. LEFT-sided coronary artery atherosclerotic calcifications. Moderate atherosclerotic calcifications of the aorta. LEFT vertebral artery arises directly from the aorta. Mediastinum/Nodes: Thyroid is unremarkable. No axillary adenopathy. Prominent mediastinal lymph nodes. Lymph node of the AP window measures 10 mm in the short axis (series 5, image 82). Subcarinal lymph node measures 16 mm in the  short axis (series 5, image 111). Lungs/Pleura: Moderate RIGHT and small LEFT pleural effusion. No pneumothorax. Enhancing bibasilar consolidative opacities most consistent with atelectasis. Interlobular septal thickening and centrilobular ground-glass opacities. Bronchial wall thickening. Upper Abdomen: No  acute abnormality. Musculoskeletal: No acute osseous abnormality. Diffuse mild sclerosis likely reflecting underlying renal osteodystrophy. Review of the MIP images confirms the above findings. IMPRESSION: 1. No evidence of acute pulmonary embolism. 2. Moderate RIGHT and small LEFT pleural effusions with associated compressive atelectasis. 3. Pulmonary edema. 4. Prominent mediastinal lymph nodes, likely reactive. 5. Cardiomegaly with small pericardial effusion. Aortic Atherosclerosis (ICD10-I70.0). Electronically Signed   By: Valentino Saxon MD   On: 02/10/2021 17:33     Medications:    . amLODipine  10 mg Oral Daily  . aspirin EC  81 mg Oral Daily  . atorvastatin  40 mg Oral q1800  . calcium acetate  667 mg Oral TID WC  . Chlorhexidine Gluconate Cloth  6 each Topical Daily  . furosemide  20 mg Oral Daily  . heparin  5,000 Units Subcutaneous Q8H  . losartan  50 mg Oral Daily  . pantoprazole  40 mg Oral Daily   acetaminophen, hydrALAZINE, hydrOXYzine, ondansetron  Assessment/ Plan:  Anita Contreras is a 65 y.o. Hispanic Spanish speaking female with end stage renal disease on hemodialysis, hypertension, diabetes mellitus type II, hyperlipidemia, CVA, diabetic retinopathy, who is admitted to [facility] on 02/10/2021 for Acute respiratory distress [R06.03] Acute pulmonary edema (HCC) [J81.0] Hypoxia [R09.02] Respiratory failure with hypoxia ([place]) [J96.91]  [facility] Nephrology TTS [facility] Right AVF 51.5kg  1. End Stage Renal Disease: with pulmonary edema - extra hemodialysis treatment for today.  - Continue TTS schedule - patient may need her estimated dry weight changed  2. Hypertension: elevated 188/59. Home regimen of losartan, furosemide, amlodipine and hydralazine.   3. Anemia with chronic kidney disease: hemoglobin 11.4  - Mircera and venofer as outpatient.   4. Secondary Hyperparathyroidism: outpatient PTH, calcium and phosphorus at goal.  - calcium acetate with  meals.    LOS: 1 Dimetrius Montfort 3/6/202212:23 PM

## 2021-02-12 LAB — ECHOCARDIOGRAM COMPLETE
AR max vel: 1.66 cm2
AV Peak grad: 7.5 mmHg
Ao pk vel: 1.37 m/s
Area-P 1/2: 4.57 cm2
Height: 59 in
S' Lateral: 3.02 cm
Weight: 1796.8 oz

## 2021-08-06 ENCOUNTER — Encounter: Payer: Self-pay | Admitting: Internal Medicine

## 2021-08-06 ENCOUNTER — Other Ambulatory Visit: Payer: Self-pay

## 2021-08-06 ENCOUNTER — Observation Stay
Admission: EM | Admit: 2021-08-06 | Discharge: 2021-08-07 | DRG: 640 | Disposition: A | Discharge status: Home / Self Care | Payer: Medicaid Other | Attending: Internal Medicine | Admitting: Internal Medicine

## 2021-08-06 DIAGNOSIS — D631 Anemia in chronic kidney disease: Secondary | ICD-10-CM | POA: Diagnosis not present

## 2021-08-06 DIAGNOSIS — R001 Bradycardia, unspecified: Secondary | ICD-10-CM

## 2021-08-06 DIAGNOSIS — I10 Essential (primary) hypertension: Secondary | ICD-10-CM | POA: Diagnosis present

## 2021-08-06 DIAGNOSIS — Z8673 Personal history of transient ischemic attack (TIA), and cerebral infarction without residual deficits: Secondary | ICD-10-CM | POA: Diagnosis not present

## 2021-08-06 DIAGNOSIS — E11319 Type 2 diabetes mellitus with unspecified diabetic retinopathy without macular edema: Secondary | ICD-10-CM | POA: Diagnosis not present

## 2021-08-06 DIAGNOSIS — E875 Hyperkalemia: Secondary | ICD-10-CM | POA: Diagnosis present

## 2021-08-06 DIAGNOSIS — I120 Hypertensive chronic kidney disease with stage 5 chronic kidney disease or end stage renal disease: Secondary | ICD-10-CM | POA: Diagnosis not present

## 2021-08-06 DIAGNOSIS — N2581 Secondary hyperparathyroidism of renal origin: Secondary | ICD-10-CM | POA: Diagnosis present

## 2021-08-06 DIAGNOSIS — Z83438 Family history of other disorder of lipoprotein metabolism and other lipidemia: Secondary | ICD-10-CM | POA: Diagnosis not present

## 2021-08-06 DIAGNOSIS — E785 Hyperlipidemia, unspecified: Secondary | ICD-10-CM | POA: Diagnosis not present

## 2021-08-06 DIAGNOSIS — Z794 Long term (current) use of insulin: Secondary | ICD-10-CM

## 2021-08-06 DIAGNOSIS — Z8249 Family history of ischemic heart disease and other diseases of the circulatory system: Secondary | ICD-10-CM | POA: Diagnosis not present

## 2021-08-06 DIAGNOSIS — M810 Age-related osteoporosis without current pathological fracture: Secondary | ICD-10-CM | POA: Diagnosis not present

## 2021-08-06 DIAGNOSIS — Z20822 Contact with and (suspected) exposure to covid-19: Secondary | ICD-10-CM | POA: Diagnosis present

## 2021-08-06 DIAGNOSIS — N186 End stage renal disease: Secondary | ICD-10-CM | POA: Diagnosis not present

## 2021-08-06 DIAGNOSIS — Z79899 Other long term (current) drug therapy: Secondary | ICD-10-CM | POA: Diagnosis not present

## 2021-08-06 DIAGNOSIS — Z884 Allergy status to anesthetic agent status: Secondary | ICD-10-CM | POA: Diagnosis not present

## 2021-08-06 DIAGNOSIS — E1165 Type 2 diabetes mellitus with hyperglycemia: Secondary | ICD-10-CM | POA: Diagnosis present

## 2021-08-06 DIAGNOSIS — Z992 Dependence on renal dialysis: Secondary | ICD-10-CM

## 2021-08-06 DIAGNOSIS — R9431 Abnormal electrocardiogram [ECG] [EKG]: Secondary | ICD-10-CM | POA: Diagnosis not present

## 2021-08-06 DIAGNOSIS — Z888 Allergy status to other drugs, medicaments and biological substances status: Secondary | ICD-10-CM

## 2021-08-06 DIAGNOSIS — E1136 Type 2 diabetes mellitus with diabetic cataract: Secondary | ICD-10-CM | POA: Diagnosis not present

## 2021-08-06 DIAGNOSIS — E1122 Type 2 diabetes mellitus with diabetic chronic kidney disease: Secondary | ICD-10-CM | POA: Diagnosis present

## 2021-08-06 DIAGNOSIS — R531 Weakness: Secondary | ICD-10-CM

## 2021-08-06 DIAGNOSIS — Z7982 Long term (current) use of aspirin: Secondary | ICD-10-CM

## 2021-08-06 LAB — GLUCOSE, CAPILLARY
Glucose-Capillary: 132 mg/dL — ABNORMAL HIGH (ref 70–99)
Glucose-Capillary: 152 mg/dL — ABNORMAL HIGH (ref 70–99)
Glucose-Capillary: 154 mg/dL — ABNORMAL HIGH (ref 70–99)
Glucose-Capillary: 208 mg/dL — ABNORMAL HIGH (ref 70–99)

## 2021-08-06 LAB — CBC WITH DIFFERENTIAL/PLATELET
Abs Immature Granulocytes: 0.06 10*3/uL (ref 0.00–0.07)
Basophils Absolute: 0.1 10*3/uL (ref 0.0–0.1)
Basophils Relative: 1 %
Eosinophils Absolute: 0.1 10*3/uL (ref 0.0–0.5)
Eosinophils Relative: 1 %
HCT: 31.8 % — ABNORMAL LOW (ref 36.0–46.0)
Hemoglobin: 10.8 g/dL — ABNORMAL LOW (ref 12.0–15.0)
Immature Granulocytes: 1 %
Lymphocytes Relative: 13 %
Lymphs Abs: 1.3 10*3/uL (ref 0.7–4.0)
MCH: 34.3 pg — ABNORMAL HIGH (ref 26.0–34.0)
MCHC: 34 g/dL (ref 30.0–36.0)
MCV: 101 fL — ABNORMAL HIGH (ref 80.0–100.0)
Monocytes Absolute: 0.4 10*3/uL (ref 0.1–1.0)
Monocytes Relative: 4 %
Neutro Abs: 8 10*3/uL — ABNORMAL HIGH (ref 1.7–7.7)
Neutrophils Relative %: 80 %
Platelets: 196 10*3/uL (ref 150–400)
RBC: 3.15 MIL/uL — ABNORMAL LOW (ref 3.87–5.11)
RDW: 13.3 % (ref 11.5–15.5)
WBC: 9.8 10*3/uL (ref 4.0–10.5)
nRBC: 0 % (ref 0.0–0.2)

## 2021-08-06 LAB — BASIC METABOLIC PANEL
Anion gap: 12 (ref 5–15)
BUN: 74 mg/dL — ABNORMAL HIGH (ref 8–23)
CO2: 26 mmol/L (ref 22–32)
Calcium: 8.9 mg/dL (ref 8.9–10.3)
Chloride: 96 mmol/L — ABNORMAL LOW (ref 98–111)
Creatinine, Ser: 7.8 mg/dL — ABNORMAL HIGH (ref 0.44–1.00)
GFR, Estimated: 5 mL/min — ABNORMAL LOW (ref >60)
Glucose, Bld: 261 mg/dL — ABNORMAL HIGH (ref 70–99)
Potassium: >7.5 mmol/L (ref 3.5–5.1)
Sodium: 134 mmol/L — ABNORMAL LOW (ref 135–145)

## 2021-08-06 LAB — PHOSPHORUS: Phosphorus: 2.3 mg/dL — ABNORMAL LOW (ref 2.5–4.6)

## 2021-08-06 LAB — CBG MONITORING, ED
Glucose-Capillary: 249 mg/dL — ABNORMAL HIGH (ref 70–99)
Glucose-Capillary: 396 mg/dL — ABNORMAL HIGH (ref 70–99)

## 2021-08-06 LAB — HEPATIC FUNCTION PANEL
ALT: 16 U/L (ref 0–44)
AST: 31 U/L (ref 15–41)
Albumin: 3.2 g/dL — ABNORMAL LOW (ref 3.5–5.0)
Alkaline Phosphatase: 113 U/L (ref 38–126)
Bilirubin, Direct: 0.1 mg/dL (ref 0.0–0.2)
Indirect Bilirubin: 0.8 mg/dL (ref 0.3–0.9)
Total Bilirubin: 0.9 mg/dL (ref 0.3–1.2)
Total Protein: 7.8 g/dL (ref 6.5–8.1)

## 2021-08-06 LAB — MAGNESIUM: Magnesium: 2.7 mg/dL — ABNORMAL HIGH (ref 1.7–2.4)

## 2021-08-06 LAB — RESP PANEL BY RT-PCR (FLU A&B, COVID) ARPGX2
Influenza A by PCR: NEGATIVE
Influenza B by PCR: NEGATIVE
SARS Coronavirus 2 by RT PCR: NEGATIVE

## 2021-08-06 LAB — TROPONIN I (HIGH SENSITIVITY)
Troponin I (High Sensitivity): 13 ng/L (ref <18)
Troponin I (High Sensitivity): 13 ng/L (ref <18)

## 2021-08-06 LAB — POTASSIUM: Potassium: 4.9 mmol/L (ref 3.5–5.1)

## 2021-08-06 LAB — MRSA NEXT GEN BY PCR, NASAL: MRSA by PCR Next Gen: NOT DETECTED

## 2021-08-06 MED ORDER — INSULIN ASPART 100 UNIT/ML IJ SOLN: 0.0000 [IU] | INTRAMUSCULAR | Status: DC

## 2021-08-06 MED ORDER — DEXTROSE 50 % IV SOLN
INTRAVENOUS | Status: AC
  Administered 2021-08-06: 25 g
  Filled 2021-08-06: qty 50

## 2021-08-06 MED ORDER — DEXTROSE 50 % IV SOLN
25.0000 mL | Freq: Once | INTRAVENOUS | Status: AC
  Administered 2021-08-06: 25 mL via INTRAVENOUS
  Filled 2021-08-06: qty 50

## 2021-08-06 MED ORDER — DEXTROSE 50 % IV SOLN
1.0000 | Freq: Once | INTRAVENOUS | Status: AC
  Administered 2021-08-06: 50 mL via INTRAVENOUS

## 2021-08-06 MED ORDER — CALCIUM GLUCONATE 10 % IV SOLN
1.0000 g | Freq: Once | INTRAVENOUS | Status: AC
  Administered 2021-08-06: 1 g via INTRAVENOUS

## 2021-08-06 MED ORDER — ALBUTEROL SULFATE (2.5 MG/3ML) 0.083% IN NEBU
10.0000 mg | INHALATION_SOLUTION | Freq: Once | RESPIRATORY_TRACT | Status: AC
  Administered 2021-08-06: 10 mg via RESPIRATORY_TRACT
  Filled 2021-08-06: qty 12

## 2021-08-06 MED ORDER — DEXTROSE 50 % IV SOLN
12.5000 g | Freq: Once | INTRAVENOUS | Status: AC
  Administered 2021-08-06: 12.5 g via INTRAVENOUS

## 2021-08-06 MED ORDER — DOCUSATE SODIUM 100 MG PO CAPS: 100.0000 mg | ORAL_CAPSULE | Freq: Two times a day (BID) | ORAL | Status: DC | PRN

## 2021-08-06 MED ORDER — POLYETHYLENE GLYCOL 3350 17 G PO PACK: 17.0000 g | PACK | Freq: Every day | ORAL | Status: DC | PRN

## 2021-08-06 MED ORDER — DEXTROSE 50 % IV SOLN
1.0000 | Freq: Once | INTRAVENOUS | Status: DC
  Filled 2021-08-06: qty 50

## 2021-08-06 MED ORDER — INSULIN ASPART 100 UNIT/ML IV SOLN
5.0000 [IU] | Freq: Once | INTRAVENOUS | Status: DC
  Filled 2021-08-06: qty 0.05

## 2021-08-06 MED ORDER — HEPARIN SODIUM (PORCINE) 1000 UNIT/ML DIALYSIS
20.0000 [IU]/kg | INTRAMUSCULAR | Status: DC | PRN
  Filled 2021-08-06: qty 1

## 2021-08-06 MED ORDER — CHLORHEXIDINE GLUCONATE CLOTH 2 % EX PADS
6.0000 | MEDICATED_PAD | Freq: Every day | CUTANEOUS | Status: DC
  Administered 2021-08-06 – 2021-08-07 (×2): 6 via TOPICAL
  Filled 2021-08-06: qty 6

## 2021-08-06 MED ORDER — INSULIN ASPART 100 UNIT/ML IV SOLN
10.0000 [IU] | Freq: Once | INTRAVENOUS | Status: AC
  Administered 2021-08-06: 10 [IU] via INTRAVENOUS
  Filled 2021-08-06: qty 0.1

## 2021-08-06 MED ORDER — ATROPINE SULFATE 1 MG/10ML IJ SOSY
1.0000 mg | PREFILLED_SYRINGE | Freq: Once | INTRAMUSCULAR | Status: AC
  Administered 2021-08-06: 0.5 mg via INTRAVENOUS
  Filled 2021-08-06: qty 10

## 2021-08-06 MED ORDER — PANTOPRAZOLE SODIUM 40 MG IV SOLR
40.0000 mg | Freq: Every day | INTRAVENOUS | Status: DC
  Administered 2021-08-06: 40 mg via INTRAVENOUS
  Filled 2021-08-06: qty 40

## 2021-08-06 MED ORDER — SODIUM BICARBONATE 8.4 % IV SOLN
50.0000 meq | Freq: Once | INTRAVENOUS | Status: AC
  Administered 2021-08-06: 50 meq via INTRAVENOUS
  Filled 2021-08-06: qty 50

## 2021-08-06 MED ORDER — CALCIUM CHLORIDE 10 % IV SOLN
INTRAVENOUS | Status: AC
  Administered 2021-08-06: 1000 mg
  Filled 2021-08-06: qty 10

## 2021-08-06 MED ORDER — HEPARIN SODIUM (PORCINE) 5000 UNIT/ML IJ SOLN
5000.0000 [IU] | Freq: Three times a day (TID) | INTRAMUSCULAR | Status: DC
  Administered 2021-08-06 – 2021-08-07 (×2): 5000 [IU] via SUBCUTANEOUS
  Filled 2021-08-06 (×2): qty 1

## 2021-08-06 NOTE — ED Provider Notes (Signed)
[facility] Emergency Department Provider Note   ____________________________________________   I have reviewed the triage vital signs and the nursing notes.   HISTORY  Chief Complaint Weakness   History limited by: Not Limited   HPI Anita Contreras is a 65 y.o. female who presents to the emergency department today because of concern for weakness and altered mental status. The history is obtained through an interpreter from family at bedside. They stated they first noticed a change in the patient around noon today. The patient's symptoms have gotten worse sense then. Family says patient had her normal session of dialysis on Saturday. Patient has had similar symptoms in the past when her sugars have been either very high or low. No known sick contacts. No recent fever or infections.    Records reviewed. Per medical record review patient has a history of ESRD on dialysis.   Past Medical History:  Diagnosis Date   Acute cystitis with hematuria    Anemia    Cataracts, bilateral    CKD (chronic kidney disease)    Diabetic retinopathy, background ([place])    Dialysis patient ([place])    Tues, Thurs, Sat   Dizziness    Hyperlipidemia    Hypertension    Migraine variant    Neuropathy    OP (osteoporosis)    Personal history of noncompliance with medical treatment, presenting hazards to health    Steal syndrome as complication of dialysis access [facility])    Stroke ([place])    Type II diabetes mellitus with ophthalmic manifestations, uncontrolled ([place])    Vitamin D deficiency     Patient Active Problem List   Diagnosis Date Noted   Acute respiratory failure with hypoxia ([place]) 02/10/2021   GERD (gastroesophageal reflux disease) 02/10/2021   Respiratory failure with hypoxia ([place]) 02/10/2021   Weakness    Acute respiratory failure due to COVID-19 ([place]) 08/07/2020   Type II diabetes mellitus with ophthalmic manifestations, uncontrolled ([place])    Abnormality  of albumin 01/20/2020   Mild protein-calorie malnutrition ([place]) 02/28/2019   Dependence on renal dialysis ([place]) 02/05/2018   HTN (hypertension) 01/26/2018   Headache, unspecified 01/22/2018   HCAP (healthcare-associated pneumonia) 12/23/2017   Fever, unspecified 12/23/2017   Bradycardia 01/17/2017   Syncope 01/17/2017   Nausea and vomiting 01/17/2017   Lactic acidosis 01/17/2017   Leukocytosis 01/17/2017   Hyperkalemia 01/14/2017   Other chronic pain 12/14/2016   Steal syndrome as complication of dialysis access ([place]) 11/08/2016   Pain in left arm 10/23/2016   Renal dialysis device, implant, or graft complication [id number]   Hyperlipidemia 10/15/2016   Essential hypertension 10/15/2016   Type 2 diabetes mellitus with ESRD (end-stage renal disease) ([place]) 10/15/2016   Disorder of phosphorus metabolism, unspecified 07/01/2016   Encounter for immunization 06/05/2016   Other disorders of plasma-protein metabolism, not elsewhere classified 06/01/2016   Anemia in chronic kidney disease 05/12/2016   Iron deficiency anemia, unspecified 05/12/2016   End stage renal disease (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 ([place])    Acute right flank pain    Acute cystitis 05/26/2015   Neuropathy 05/26/2015    Past Surgical History:  Procedure Laterality Date   A/V FISTULAGRAM Right 06/03/2018   Procedure: A/V FISTULAGRAM;  Surgeon: Algernon Huxley, MD;  Location: [place] [facility];  Service: Cardiovascular;  Laterality: Right;   A/V FISTULAGRAM Right  02/22/2019   Procedure: A/V FISTULAGRAM;  Surgeon: Algernon Huxley, MD;  Location: [place] [facility];  Service: Cardiovascular;  Laterality: Right;   A/V SHUNT INTERVENTION N/A 01/26/2018   Procedure: A/V SHUNT INTERVENTION;  Surgeon: Algernon Huxley, MD;  Location: [place] [facility]  [facility];  Service: Cardiovascular;  Laterality: N/A;   A/V SHUNTOGRAM Right 01/26/2018   Procedure: A/V SHUNTOGRAM;  Surgeon: Algernon Huxley, MD;  Location: [place] [facility];  Service: Cardiovascular;  Laterality: Right;   AV FISTULA PLACEMENT Right 10/10/2016   Procedure: ARTERIOVENOUS (AV) FISTULA CREATION ( BRACHIOCEPHALIC );  Surgeon: Algernon Huxley, MD;  Location: [facility] ORS;  Service: Vascular;  Laterality: Right;   DILATION AND CURETTAGE OF UTERUS     DISTAL REVASCULARIZATION AND INTERVAL LIGATION (DRIL) Right 11/08/2016   Procedure: DISTAL REVASCULARIZATION AND INTERVAL LIGATION (DRIL);  Surgeon: Algernon Huxley, MD;  Location: [facility] ORS;  Service: Vascular;  Laterality: Right;   PERIPHERAL VASCULAR CATHETERIZATION N/A 04/25/2016   Procedure: Dialysis/Perma Catheter Insertion;  Surgeon: Algernon Huxley, MD;  Location: [place] [facility];  Service: Cardiovascular;  Laterality: N/A;   PERIPHERAL VASCULAR CATHETERIZATION Right 10/17/2016   Procedure: Upper Extremity Angiography;  Surgeon: Algernon Huxley, MD;  Location: [place] [facility];  Service: Cardiovascular;  Laterality: Right;   UPPER EXTREMITY ANGIOGRAPHY Right 10/12/2018   Procedure: UPPER EXTREMITY ANGIOGRAPHY;  Surgeon: Algernon Huxley, MD;  Location: [place] [facility];  Service: Cardiovascular;  Laterality: Right;    Prior to Admission medications   Medication Sig Start Date End Date Taking? Authorizing Provider  acetaminophen (TYLENOL) 500 MG tablet Take 1,000 mg by mouth every 6 (six) hours as needed for mild pain or headache.    [provider]  amLODipine (NORVASC) 10 MG tablet Take 1 tablet (10 mg total) by mouth daily. 01/27/18   Bettey Costa, MD  aspirin 81 MG EC tablet TAKE 1 TABLET BY MOUTH EVERY DAY 04/15/17   Stegmayer, Joelene Millin A, PA-C  atorvastatin (LIPITOR) 40 MG tablet Take 40 mg by mouth daily at 6 PM.     [provider]  calcium acetate (PHOSLO) 667 MG capsule Take 1 capsule (667 mg total) by mouth 3 (three) times  daily with meals. 04/19/16   Fritzi Mandes, MD  furosemide (LASIX) 20 MG tablet Take 20 mg by mouth daily.    [provider]  hydrOXYzine (ATARAX/VISTARIL) 25 MG tablet Take 25 mg by mouth 2 (two) times daily as needed for itching.    [provider]  insulin NPH-regular Human (70-30) 100 UNIT/ML injection Inject 3 Units into the skin 2 (two) times daily with a meal. Patient not taking: Reported on 02/10/2021    [provider]  lidocaine-prilocaine (EMLA) cream Apply 1 application topically as needed.    [provider]  losartan (COZAAR) 50 MG tablet Take 1 tablet (50 mg total) by mouth daily. 01/27/18   Bettey Costa, MD  pantoprazole (PROTONIX) 40 MG tablet Take 1 tablet (40 mg total) by mouth daily. 08/12/20   Loletha Grayer, MD    Allergies Dextromethorphan-guaifenesin and Propofol  Family History  Problem Relation Age of Onset   Hypertension Mother    Hyperlipidemia Mother     Social History Social History   Tobacco Use   Smoking status: Never   Smokeless tobacco: Never  Substance Use Topics   Alcohol use: No   Drug use: No    Review of Systems Constitutional: No fever. Generalized weakness.  Eyes:  No visual changes. ENT: No sore throat. Cardiovascular: Denies chest pain. Respiratory: Denies shortness of breath. Gastrointestinal: No abdominal pain.  No nausea, no vomiting.  No diarrhea.   Genitourinary: Negative for dysuria. Musculoskeletal: Negative for back pain. Skin: Negative for rash. Neurological: Negative for headaches, focal weakness or numbness.  ____________________________________________   PHYSICAL EXAM:  VITAL SIGNS: ED Triage Vitals  Enc Vitals Group     BP 08/06/21 1906 (!) 121/38     Pulse Rate 08/06/21 1906 (!) 35     Resp 08/06/21 1906 20     Temp 08/06/21 1906 97.7 F (36.5 C)     Temp Source 08/06/21 1906 Oral     SpO2 08/06/21 1906 100 %     Weight 08/06/21 1904 100 lb (45.4 kg)     Height --      Head  Circumference --      Peak Flow --      Pain Score 08/06/21 1904 0   Constitutional: Awake and alert.  Eyes: Conjunctivae are normal.  ENT      Head: Normocephalic and atraumatic.      Nose: No congestion/rhinnorhea.      Mouth/Throat: Mucous membranes are moist.      Neck: No stridor. Hematological/Lymphatic/Immunilogical: No cervical lymphadenopathy. Cardiovascular: Bradycardia, regular rhythm.  No murmurs, rubs, or gallops.  Respiratory: Normal respiratory effort without tachypnea nor retractions. Breath sounds are clear and equal bilaterally. No wheezes/rales/rhonchi. Gastrointestinal: Soft and non tender. No rebound. No guarding.  Genitourinary: Deferred Musculoskeletal: Normal range of motion in all extremities. Fistula in right upper extremity.  Neurologic:  Awake and alert. Moving all extremities.  Skin:  Skin is warm, clammy.  ____________________________________________    LABS (pertinent positives/negatives)  COVID negative BMP na 134, k >7.5, glu 261, cr 7.80 CBC wbc 9.8, hgb 10.8, plt 196  ____________________________________________   EKG  I, Nance Pear, attending physician, personally viewed and interpreted this EKG  EKG Time: 1906 Rate: 36 Rhythm: sinus bradycardia with 1st degree av block Axis: left axis deviation Intervals: qtc 442 QRS: narrow ST changes: no st elevation Impression: abnormal ekg ____________________________________________    RADIOLOGY  None  ____________________________________________   PROCEDURES  Procedures  CRITICAL CARE Performed by: Nance Pear   Total critical care time: 40 minutes  Critical care time was exclusive of separately billable procedures and treating other patients.  Critical care was necessary to treat or prevent imminent or life-threatening deterioration.  Critical care was time spent personally by me on the following activities: development of treatment plan with patient and/or  surrogate as well as nursing, discussions with consultants, evaluation of patient's response to treatment, examination of patient, obtaining history from patient or surrogate, ordering and performing treatments and interventions, ordering and review of laboratory studies, ordering and review of radiographic studies, pulse oximetry and re-evaluation of patient's condition.  ____________________________________________   INITIAL IMPRESSION / ASSESSMENT AND PLAN / ED COURSE  Pertinent labs & imaging results that were available during my care of the patient were reviewed by me and considered in my medical decision making (see chart for details).   Patient presented to the emergency department today because of concerns for weakness as well as some altered mental status.  Patient's symptoms started around noon today.  On exam patient is awake and alert although not very verbal.  It is possible that there is some language barrier however hospital interpreter was used.  Family was supplying history.  Notably patient was found to be quite bradycardic  upon arrival to the emergency department.  EKG was also concern for some possible peaked T waves.  Patient did undergo normal dialysis session 2 days ago.  Given bradycardia did try atropine which did help increase her heart rate.  Patient appeared more weak.  Blood work then returned with significantly elevated potassium levels.  She was ordered medication to help start bringing at that time.  I talked to Dr. Candiss Norse with nephrology to help arrange emergent dialysis.  Additionally discussed with ICU team for admission.  ____________________________________________   FINAL CLINICAL IMPRESSION(S) / ED DIAGNOSES  Final diagnoses:  Sinus bradycardia  Weakness  Hyperkalemia     Note: This dictation was prepared with Dragon dictation. Any transcriptional errors that result from this process are unintentional     Nance Pear, MD 08/06/21 2035

## 2021-08-06 NOTE — H&P (Signed)
Anita Contreras, MRN:  [id number], DOB:  January 18, 1956, LOS: 0 ADMISSION DATE:  08/06/2021, CONSULTATION DATE:  08/06/21 REFERRING MD:  Dr. Archie Balboa, CHIEF COMPLAINT:  generalized weakness   History of Present Illness:  65 year old female presented to [facility] ED on 08/06/2021 from home with complaints of generalized weakness and altered mental status.   History obtained bedside via daughter and patient with Spanish interpreter assistance.  The patient's daughter reports that she was in her normal state of health until yesterday 08/05/2021 when she began having " kidney pain" and complaining of nausea.  Because of the nausea the patient was unable to tolerate very much food.  Then patient confirms around noon on 08/06/2021 she became very weak requiring assistance to walk.  The patient admits to feeling cold, with continuing nausea/ generalized weakness, slight dizziness upon standing & the " kidney pain" is described as bilateral flank pain, unclear on severity of pain. Both the patient and daughter deny fevers, vomiting, diarrhea, and no current complaints of pain & no known sick contacts. Family confirms patient received her normal dialysis session on Saturday, and denied any recent dietary changes including specifically drinking orange juice and/or eating excessive tomatoes. ED course: Initial vitals: afebrile-T: 97.8, RR stable at 13, bradycardic- 34, BP stable: 123/39 (63) & SpO2 99% on room air Labs/ Imaging personally reviewed Significant labs: severely hyperkalemic at >7.5, Na+: 134, BUN/Cr.: 74/ 7.8, Serum CO2/ AG: 26/ 12, Hgb: 10.8, Troponin: 13, WBC: 9.8, COVID-19 & influenza A/B: Negative I, Domingo Pulse [place], AGACNP-BC, personally viewed and interpreted this ECG. EKG Interpretation Date: 08/06/21 EKG Time: 20:08 Rate: 70 Rhythm: Accelerated Junctional  QRS Axis:  extreme axis Intervals: prolonged QTc, BBB ST/T Wave abnormalities: peaked T waves Narrative Interpretation:  Accelerated junctional rhythm with peaked T waves  Upon arrival to ED patient was significantly bradycardic in the 30s with correlating altered mental status.  Serial EKGs reveal peaked T waves and intermittent  accelerated junctional rhythm.  Patient received 1 mg of atropine with some improvement in heart rate to the 60's with correlating improvement in mentation. EDP consulted Dr. Candiss Norse, nephrology who arrived bedside and consented the patient for emergent HD. Patient received calcium gluconate, sodium bicarb, insulin IV & D 50 for hyperkalemia.  PCCM consulted for admission due to severe electrolyte disturbances   Pertinent  Medical History  ESRD on HD ([facility]) HLD HTN CVA T2DM Anemia Steal syndrome Diabetic retinopathy cataracts  Significant Hospital Events: Including procedures, antibiotic start and stop dates in addition to other pertinent events   08/06/21: Patient admitted to ICU for emergent HD due to severe hyperkalemia with EKG changes  Interim History / Subjective:  Patient appears to be resting comfortably in bed with no current complaints of pain/nausea. Daughter is bedside with Spanish interpreter present.  Objective   Blood pressure (!) 149/62, pulse 81, temperature 97.8 F (36.6 C), temperature source Oral, resp. rate 19, weight 45.4 kg, last menstrual period 09/19/2001, SpO2 100 %.       No intake or output data in the 24 hours ending 08/06/21 2032 Filed Weights   08/06/21 1904  Weight: 45.4 kg    Examination: General: Adult female, acutely ill, lying in bed, frail appearing, NAD HEENT: MM pink/moist, anicteric, atraumatic, neck supple, unable to see from L eye at baseline  Neuro: A&O x 3, able to follow commands, PERRL L not reactive/ R sluggish, MAE CV: s1s2, SB on monitor, no r/m/g Pulm: Regular, non labored on RA, breath sounds clear-BUL &  diminished-BLL GI: soft, rounded, non tender, bs x 4 Skin: scattered ecchymosis, no rashes/lesions  noted Extremities: warm/dry, pulses + 2 R/P, no edema noted  Resolved Hospital Problem list     Assessment & Plan:  Hyperkalemia ESRD on HD Cr on admission: 7.80, K+: > 7.5 Ca+ gluconate 1 g & albuterol given. As patient is now denying any "kidney pain" will hold off on further imaging. - give 10 units of insulin with D50 amp & 1 amp of bicarb - will have Emergent HD in ICU - consider renal US/CT abdomen if flank pain returns - Strict I/O's - Daily BMP, replace electrolytes PRN - Avoid nephrotoxic agents as able, ensure adequate renal perfusion - Nephrology following, appreciate input   - consider restarting home medications: lasix, calcium acetate as patient stabilizes  Bradycardia & Accelerated Junctional rhythm due to sever hyperkalemia PMHx: HTN, HLD Atropine given. Expect HR to improve with emergent HD. Troponin: 13 - shifting measures ordered, see above - continuous cardiac monitoring - Outpatient medications on hold: amlodipine & losartan, consider restarting as patient stabilizes. Due to complaints of weakness > f/u hepatic function panel, Lipitor on hold, consider restarting as patient stabilizes.  Poorly controlled Type 2 Diabetes Mellitus Hemoglobin A1C: pending, daughter reports severe hyperglycemia in the past - Monitor CBG Q 4 hours - SSI moderate dosing - target range while in ICU: 140-180 - follow ICU hyper/hypo-glycemia protocol - consult Diabetes Coordinator if needed, depending on H A1C  Best Practice (right click and "Reselect all SmartList Selections" daily)  Diet/type: NPO DVT prophylaxis: prophylactic heparin  GI prophylaxis: PPI Lines: N/A Foley:  N/A Code Status:  full code Last date of multidisciplinary goals of care discussion [08/06/21]  Labs   CBC: Recent Labs  Lab 08/06/21 1920  WBC 9.8  NEUTROABS 8.0*  HGB 10.8*  HCT 31.8*  MCV 101.0*  PLT 123456    Basic Metabolic Panel: Recent Labs  Lab 08/06/21 1920  NA 134*  K >7.5*  CL 96*   CO2 26  GLUCOSE 261*  BUN 74*  CREATININE 7.80*  CALCIUM 8.9   GFR: Estimated Creatinine Clearance: 4.9 mL/min (A) (by C-G formula based on SCr of 7.8 mg/dL (H)). Recent Labs  Lab 08/06/21 1920  WBC 9.8    Liver Function Tests: No results for input(s): AST, ALT, ALKPHOS, BILITOT, PROT, ALBUMIN in the last 168 hours. No results for input(s): LIPASE, AMYLASE in the last 168 hours. No results for input(s): AMMONIA in the last 168 hours.  ABG    Component Value Date/Time   PHART 7.26 (L) 01/14/2017 0813   PCO2ART 52 (H) 01/14/2017 0813   PO2ART 71 (L) 01/14/2017 0813   HCO3 36.6 (H) 02/10/2021 1903   ACIDBASEDEF 4.1 (H) 01/14/2017 0813   O2SAT 99.8 02/10/2021 1903     Coagulation Profile: No results for input(s): INR, PROTIME in the last 168 hours.  Cardiac Enzymes: No results for input(s): CKTOTAL, CKMB, CKMBINDEX, TROPONINI in the last 168 hours.  HbA1C: Hgb A1c MFr Bld  Date/Time Value Ref Range Status  08/08/2020 04:58 AM 6.4 (H) 4.8 - 5.6 % Final    Comment:    (NOTE) Pre diabetes:          5.7%-6.4%  Diabetes:              >6.4%  Glycemic control for   <7.0% adults with diabetes   04/15/2016 07:28 PM 7.2 (H) 4.0 - 6.0 % Final    CBG: Recent Labs  Lab 08/06/21  1933  GLUCAP 249*    Review of Systems: Positives in BOLD   Gen: Denies fever, chills, weight change, fatigue, night sweats HEENT: Denies blurred vision, double vision, hearing loss, tinnitus, sinus congestion, rhinorrhea, sore throat, neck stiffness, dysphagia PULM: Denies shortness of breath, cough, sputum production, hemoptysis, wheezing CV: Denies chest pain, edema, orthopnea, paroxysmal nocturnal dyspnea, palpitations GI: Denies abdominal pain, nausea, vomiting, diarrhea, hematochezia, melena, constipation, change in bowel habits GU: Denies dysuria, hematuria, polyuria, oliguria, urethral discharge Endocrine: Denies hot or cold intolerance, polyuria, polyphagia or appetite change Derm:  Denies rash, dry skin, scaling or peeling skin change Heme: Denies easy bruising, bleeding, bleeding gums Neuro: Denies headache, numbness, weakness, slurred speech, loss of memory or consciousness  Past Medical History:  She,  has a past medical history of Acute cystitis with hematuria, Anemia, Cataracts, bilateral, CKD (chronic kidney disease), Diabetic retinopathy, background ([place]), Dialysis patient ([place]), Dizziness, Hyperlipidemia, Hypertension, Migraine variant, Neuropathy, OP (osteoporosis), Personal history of noncompliance with medical treatment, presenting hazards to health, Steal syndrome as complication of dialysis access [facility]), Stroke ([place]), Type II diabetes mellitus with ophthalmic manifestations, uncontrolled ([place]), and Vitamin D deficiency.   Surgical History:   Past Surgical History:  Procedure Laterality Date   A/V FISTULAGRAM Right 06/03/2018   Procedure: A/V FISTULAGRAM;  Surgeon: Algernon Huxley, MD;  Location: [place] [facility];  Service: Cardiovascular;  Laterality: Right;   A/V FISTULAGRAM Right 02/22/2019   Procedure: A/V FISTULAGRAM;  Surgeon: Algernon Huxley, MD;  Location: [place] [facility];  Service: Cardiovascular;  Laterality: Right;   A/V SHUNT INTERVENTION N/A 01/26/2018   Procedure: A/V SHUNT INTERVENTION;  Surgeon: Algernon Huxley, MD;  Location: [place] [facility];  Service: Cardiovascular;  Laterality: N/A;   A/V SHUNTOGRAM Right 01/26/2018   Procedure: A/V SHUNTOGRAM;  Surgeon: Algernon Huxley, MD;  Location: [place] [facility];  Service: Cardiovascular;  Laterality: Right;   AV FISTULA PLACEMENT Right 10/10/2016   Procedure: ARTERIOVENOUS (AV) FISTULA CREATION ( BRACHIOCEPHALIC );  Surgeon: Algernon Huxley, MD;  Location: [facility] ORS;  Service: Vascular;  Laterality: Right;   DILATION AND CURETTAGE OF UTERUS     DISTAL REVASCULARIZATION AND INTERVAL LIGATION (DRIL) Right 11/08/2016   Procedure: DISTAL REVASCULARIZATION AND INTERVAL LIGATION (DRIL);  Surgeon: Algernon Huxley,  MD;  Location: [facility] ORS;  Service: Vascular;  Laterality: Right;   PERIPHERAL VASCULAR CATHETERIZATION N/A 04/25/2016   Procedure: Dialysis/Perma Catheter Insertion;  Surgeon: Algernon Huxley, MD;  Location: [place] [facility];  Service: Cardiovascular;  Laterality: N/A;   PERIPHERAL VASCULAR CATHETERIZATION Right 10/17/2016   Procedure: Upper Extremity Angiography;  Surgeon: Algernon Huxley, MD;  Location: [place] [facility];  Service: Cardiovascular;  Laterality: Right;   UPPER EXTREMITY ANGIOGRAPHY Right 10/12/2018   Procedure: UPPER EXTREMITY ANGIOGRAPHY;  Surgeon: Algernon Huxley, MD;  Location: [place] [facility];  Service: Cardiovascular;  Laterality: Right;     Social History:   reports that she has never smoked. She has never used smokeless tobacco. She reports that she does not drink alcohol and does not use drugs.   Family History:  Her family history includes Hyperlipidemia in her mother; Hypertension in her mother.   Allergies Allergies  Allergen Reactions   Dextromethorphan-Guaifenesin     Other reaction(s): Itching of skin (finding)   Propofol Itching    Pt says " when getting anesthesia by body itches" unsure what type of anesthesia     Home Medications  Prior to Admission  medications   Medication Sig Start Date End Date Taking? Authorizing Provider  acetaminophen (TYLENOL) 500 MG tablet Take 1,000 mg by mouth every 6 (six) hours as needed for mild pain or headache.    [provider]  amLODipine (NORVASC) 10 MG tablet Take 1 tablet (10 mg total) by mouth daily. 01/27/18   Bettey Costa, MD  aspirin 81 MG EC tablet TAKE 1 TABLET BY MOUTH EVERY DAY 04/15/17   Stegmayer, Joelene Millin A, PA-C  atorvastatin (LIPITOR) 40 MG tablet Take 40 mg by mouth daily at 6 PM.     [provider]  calcium acetate (PHOSLO) 667 MG capsule Take 1 capsule (667 mg total) by mouth 3 (three) times daily with meals. 04/19/16   Fritzi Mandes, MD  furosemide (LASIX) 20 MG tablet Take 20 mg by  mouth daily.    [provider]  hydrOXYzine (ATARAX/VISTARIL) 25 MG tablet Take 25 mg by mouth 2 (two) times daily as needed for itching.    [provider]  insulin NPH-regular Human (70-30) 100 UNIT/ML injection Inject 3 Units into the skin 2 (two) times daily with a meal. Patient not taking: Reported on 02/10/2021    [provider]  lidocaine-prilocaine (EMLA) cream Apply 1 application topically as needed.    [provider]  losartan (COZAAR) 50 MG tablet Take 1 tablet (50 mg total) by mouth daily. 01/27/18   Bettey Costa, MD  pantoprazole (PROTONIX) 40 MG tablet Take 1 tablet (40 mg total) by mouth daily. 08/12/20   Loletha Grayer, MD     Critical care time: 50 minutes       Venetia Night, AGACNP-BC Acute Care Nurse Practitioner [facility]   [phone] / [phone] Please see Amion for pager details.

## 2021-08-06 NOTE — H&P (View-Only) (Signed)
Anita Contreras, MRN:  [id number], DOB:  01-Feb-1956, LOS: 0 ADMISSION DATE:  08/06/2021, CONSULTATION DATE:  08/06/21 REFERRING MD:  Dr. Archie Balboa, CHIEF COMPLAINT:  generalized weakness   History of Present Illness:  65 year old female presented to [facility] ED on 08/06/2021 from home with complaints of generalized weakness and altered mental status.   History obtained bedside via daughter and patient with Spanish interpreter assistance.  The patient's daughter reports that she was in her normal state of health until yesterday 08/05/2021 when she began having " kidney pain" and complaining of nausea.  Because of the nausea the patient was unable to tolerate very much food.  Then patient confirms around noon on 08/06/2021 she became very weak requiring assistance to walk.  The patient admits to feeling cold, with continuing nausea/ generalized weakness, slight dizziness upon standing & the " kidney pain" is described as bilateral flank pain, unclear on severity of pain. Both the patient and daughter deny fevers, vomiting, diarrhea, and no current complaints of pain & no known sick contacts. Family confirms patient received her normal dialysis session on Saturday, and denied any recent dietary changes including specifically drinking orange juice and/or eating excessive tomatoes. ED course: Initial vitals: afebrile-T: 97.8, RR stable at 13, bradycardic- 34, BP stable: 123/39 (63) & SpO2 99% on room air Labs/ Imaging personally reviewed Significant labs: severely hyperkalemic at >7.5, Na+: 134, BUN/Cr.: 74/ 7.8, Serum CO2/ AG: 26/ 12, Hgb: 10.8, Troponin: 13, WBC: 9.8, COVID-19 & influenza A/B: Negative I, Domingo Pulse [place], AGACNP-BC, personally viewed and interpreted this ECG. EKG Interpretation Date: 08/06/21 EKG Time: 20:08 Rate: 70 Rhythm: Accelerated Junctional  QRS Axis:  extreme axis Intervals: prolonged QTc, BBB ST/T Wave abnormalities: peaked T waves Narrative Interpretation:  Accelerated junctional rhythm with peaked T waves  Upon arrival to ED patient was significantly bradycardic in the 30s with correlating altered mental status.  Serial EKGs reveal peaked T waves and intermittent  accelerated junctional rhythm.  Patient received 1 mg of atropine with some improvement in heart rate to the 60's with correlating improvement in mentation. EDP consulted Dr. Candiss Norse, nephrology who arrived bedside and consented the patient for emergent HD. Patient received calcium gluconate, sodium bicarb, insulin IV & D 50 for hyperkalemia.  PCCM consulted for admission due to severe electrolyte disturbances   Pertinent  Medical History  ESRD on HD ([facility]) HLD HTN CVA T2DM Anemia Steal syndrome Diabetic retinopathy cataracts  Significant Hospital Events: Including procedures, antibiotic start and stop dates in addition to other pertinent events   08/06/21: Patient admitted to ICU for emergent HD due to severe hyperkalemia with EKG changes  Interim History / Subjective:  Patient appears to be resting comfortably in bed with no current complaints of pain/nausea. Daughter is bedside with Spanish interpreter present.  Objective   Blood pressure (!) 149/62, pulse 81, temperature 97.8 F (36.6 C), temperature source Oral, resp. rate 19, weight 45.4 kg, last menstrual period 09/19/2001, SpO2 100 %.       No intake or output data in the 24 hours ending 08/06/21 2032 Filed Weights   08/06/21 1904  Weight: 45.4 kg    Examination: General: Adult female, acutely ill, lying in bed, frail appearing, NAD HEENT: MM pink/moist, anicteric, atraumatic, neck supple, unable to see from L eye at baseline  Neuro: A&O x 3, able to follow commands, PERRL L not reactive/ R sluggish, MAE CV: s1s2, SB on monitor, no r/m/g Pulm: Regular, non labored on RA, breath sounds clear-BUL &  diminished-BLL GI: soft, rounded, non tender, bs x 4 Skin: scattered ecchymosis, no rashes/lesions  noted Extremities: warm/dry, pulses + 2 R/P, no edema noted  Resolved Hospital Problem list     Assessment & Plan:  Hyperkalemia ESRD on HD Cr on admission: 7.80, K+: > 7.5 Ca+ gluconate 1 g & albuterol given. As patient is now denying any "kidney pain" will hold off on further imaging. - give 10 units of insulin with D50 amp & 1 amp of bicarb - will have Emergent HD in ICU - consider renal US/CT abdomen if flank pain returns - Strict I/O's - Daily BMP, replace electrolytes PRN - Avoid nephrotoxic agents as able, ensure adequate renal perfusion - Nephrology following, appreciate input   - consider restarting home medications: lasix, calcium acetate as patient stabilizes  Bradycardia & Accelerated Junctional rhythm due to sever hyperkalemia PMHx: HTN, HLD Atropine given. Expect HR to improve with emergent HD. Troponin: 13 - shifting measures ordered, see above - continuous cardiac monitoring - Outpatient medications on hold: amlodipine & losartan, consider restarting as patient stabilizes. Due to complaints of weakness > f/u hepatic function panel, Lipitor on hold, consider restarting as patient stabilizes.  Poorly controlled Type 2 Diabetes Mellitus Hemoglobin A1C: pending, daughter reports severe hyperglycemia in the past - Monitor CBG Q 4 hours - SSI moderate dosing - target range while in ICU: 140-180 - follow ICU hyper/hypo-glycemia protocol - consult Diabetes Coordinator if needed, depending on H A1C  Best Practice (right click and "Reselect all SmartList Selections" daily)  Diet/type: NPO DVT prophylaxis: prophylactic heparin  GI prophylaxis: PPI Lines: N/A Foley:  N/A Code Status:  full code Last date of multidisciplinary goals of care discussion [08/06/21]  Labs   CBC: Recent Labs  Lab 08/06/21 1920  WBC 9.8  NEUTROABS 8.0*  HGB 10.8*  HCT 31.8*  MCV 101.0*  PLT 123456    Basic Metabolic Panel: Recent Labs  Lab 08/06/21 1920  NA 134*  K >7.5*  CL 96*   CO2 26  GLUCOSE 261*  BUN 74*  CREATININE 7.80*  CALCIUM 8.9   GFR: Estimated Creatinine Clearance: 4.9 mL/min (A) (by C-G formula based on SCr of 7.8 mg/dL (H)). Recent Labs  Lab 08/06/21 1920  WBC 9.8    Liver Function Tests: No results for input(s): AST, ALT, ALKPHOS, BILITOT, PROT, ALBUMIN in the last 168 hours. No results for input(s): LIPASE, AMYLASE in the last 168 hours. No results for input(s): AMMONIA in the last 168 hours.  ABG    Component Value Date/Time   PHART 7.26 (L) 01/14/2017 0813   PCO2ART 52 (H) 01/14/2017 0813   PO2ART 71 (L) 01/14/2017 0813   HCO3 36.6 (H) 02/10/2021 1903   ACIDBASEDEF 4.1 (H) 01/14/2017 0813   O2SAT 99.8 02/10/2021 1903     Coagulation Profile: No results for input(s): INR, PROTIME in the last 168 hours.  Cardiac Enzymes: No results for input(s): CKTOTAL, CKMB, CKMBINDEX, TROPONINI in the last 168 hours.  HbA1C: Hgb A1c MFr Bld  Date/Time Value Ref Range Status  08/08/2020 04:58 AM 6.4 (H) 4.8 - 5.6 % Final    Comment:    (NOTE) Pre diabetes:          5.7%-6.4%  Diabetes:              >6.4%  Glycemic control for   <7.0% adults with diabetes   04/15/2016 07:28 PM 7.2 (H) 4.0 - 6.0 % Final    CBG: Recent Labs  Lab 08/06/21  1933  GLUCAP 249*    Review of Systems: Positives in BOLD   Gen: Denies fever, chills, weight change, fatigue, night sweats HEENT: Denies blurred vision, double vision, hearing loss, tinnitus, sinus congestion, rhinorrhea, sore throat, neck stiffness, dysphagia PULM: Denies shortness of breath, cough, sputum production, hemoptysis, wheezing CV: Denies chest pain, edema, orthopnea, paroxysmal nocturnal dyspnea, palpitations GI: Denies abdominal pain, nausea, vomiting, diarrhea, hematochezia, melena, constipation, change in bowel habits GU: Denies dysuria, hematuria, polyuria, oliguria, urethral discharge Endocrine: Denies hot or cold intolerance, polyuria, polyphagia or appetite change Derm:  Denies rash, dry skin, scaling or peeling skin change Heme: Denies easy bruising, bleeding, bleeding gums Neuro: Denies headache, numbness, weakness, slurred speech, loss of memory or consciousness  Past Medical History:  She,  has a past medical history of Acute cystitis with hematuria, Anemia, Cataracts, bilateral, CKD (chronic kidney disease), Diabetic retinopathy, background ([place]), Dialysis patient ([place]), Dizziness, Hyperlipidemia, Hypertension, Migraine variant, Neuropathy, OP (osteoporosis), Personal history of noncompliance with medical treatment, presenting hazards to health, Steal syndrome as complication of dialysis access [facility]), Stroke ([place]), Type II diabetes mellitus with ophthalmic manifestations, uncontrolled ([place]), and Vitamin D deficiency.   Surgical History:   Past Surgical History:  Procedure Laterality Date   A/V FISTULAGRAM Right 06/03/2018   Procedure: A/V FISTULAGRAM;  Surgeon: Algernon Huxley, MD;  Location: [place] [facility];  Service: Cardiovascular;  Laterality: Right;   A/V FISTULAGRAM Right 02/22/2019   Procedure: A/V FISTULAGRAM;  Surgeon: Algernon Huxley, MD;  Location: [place] [facility];  Service: Cardiovascular;  Laterality: Right;   A/V SHUNT INTERVENTION N/A 01/26/2018   Procedure: A/V SHUNT INTERVENTION;  Surgeon: Algernon Huxley, MD;  Location: [place] [facility];  Service: Cardiovascular;  Laterality: N/A;   A/V SHUNTOGRAM Right 01/26/2018   Procedure: A/V SHUNTOGRAM;  Surgeon: Algernon Huxley, MD;  Location: [place] [facility];  Service: Cardiovascular;  Laterality: Right;   AV FISTULA PLACEMENT Right 10/10/2016   Procedure: ARTERIOVENOUS (AV) FISTULA CREATION ( BRACHIOCEPHALIC );  Surgeon: Algernon Huxley, MD;  Location: [facility];  Service: Vascular;  Laterality: Right;   DILATION AND CURETTAGE OF UTERUS     DISTAL REVASCULARIZATION AND INTERVAL LIGATION (DRIL) Right 11/08/2016   Procedure: DISTAL REVASCULARIZATION AND INTERVAL LIGATION (DRIL);  Surgeon: Algernon Huxley,  MD;  Location: [facility];  Service: Vascular;  Laterality: Right;   PERIPHERAL VASCULAR CATHETERIZATION N/A 04/25/2016   Procedure: Dialysis/Perma Catheter Insertion;  Surgeon: Algernon Huxley, MD;  Location: [place] [facility];  Service: Cardiovascular;  Laterality: N/A;   PERIPHERAL VASCULAR CATHETERIZATION Right 10/17/2016   Procedure: Upper Extremity Angiography;  Surgeon: Algernon Huxley, MD;  Location: [place] [facility];  Service: Cardiovascular;  Laterality: Right;   UPPER EXTREMITY ANGIOGRAPHY Right 10/12/2018   Procedure: UPPER EXTREMITY ANGIOGRAPHY;  Surgeon: Algernon Huxley, MD;  Location: [place] [facility];  Service: Cardiovascular;  Laterality: Right;     Social History:   reports that she has never smoked. She has never used smokeless tobacco. She reports that she does not drink alcohol and does not use drugs.   Family History:  Her family history includes Hyperlipidemia in her mother; Hypertension in her mother.   Allergies Allergies  Allergen Reactions   Dextromethorphan-Guaifenesin     Other reaction(s): Itching of skin (finding)   Propofol Itching    Pt says " when getting anesthesia by body itches" unsure what type of anesthesia     Home Medications  Prior to Admission  medications   Medication Sig Start Date End Date Taking? Authorizing Provider  acetaminophen (TYLENOL) 500 MG tablet Take 1,000 mg by mouth every 6 (six) hours as needed for mild pain or headache.    [provider]  amLODipine (NORVASC) 10 MG tablet Take 1 tablet (10 mg total) by mouth daily. 01/27/18   Bettey Costa, MD  aspirin 81 MG EC tablet TAKE 1 TABLET BY MOUTH EVERY DAY 04/15/17   Stegmayer, Joelene Millin A, PA-C  atorvastatin (LIPITOR) 40 MG tablet Take 40 mg by mouth daily at 6 PM.     [provider]  calcium acetate (PHOSLO) 667 MG capsule Take 1 capsule (667 mg total) by mouth 3 (three) times daily with meals. 04/19/16   Fritzi Mandes, MD  furosemide (LASIX) 20 MG tablet Take 20 mg by  mouth daily.    [provider]  hydrOXYzine (ATARAX/VISTARIL) 25 MG tablet Take 25 mg by mouth 2 (two) times daily as needed for itching.    [provider]  insulin NPH-regular Human (70-30) 100 UNIT/ML injection Inject 3 Units into the skin 2 (two) times daily with a meal. Patient not taking: Reported on 02/10/2021    [provider]  lidocaine-prilocaine (EMLA) cream Apply 1 application topically as needed.    [provider]  losartan (COZAAR) 50 MG tablet Take 1 tablet (50 mg total) by mouth daily. 01/27/18   Bettey Costa, MD  pantoprazole (PROTONIX) 40 MG tablet Take 1 tablet (40 mg total) by mouth daily. 08/12/20   Loletha Grayer, MD     Critical care time: 50 minutes       Venetia Night, AGACNP-BC Acute Care Nurse Practitioner [facility]   [phone] / [phone] Please see Amion for pager details.

## 2021-08-06 NOTE — Progress Notes (Signed)
eLink Physician-Brief Progress Note Patient Name: Anita Contreras DOB: 1956-11-11 MRN: [id number]   Date of Service  08/06/2021  HPI/Events of Note  Patient with ESRD-DD admitted with uremia and severe hyperkalemia with mental status and EKG changes, she is currently receiving emergent hemodialysis.  eICU Interventions  New Patient Evaluation.        Kerry Kass Chandell Attridge 08/06/2021, 10:31 PM

## 2021-08-06 NOTE — Progress Notes (Signed)
Pre HD RN assessment, primary staff used interpreter services

## 2021-08-06 NOTE — ED Triage Notes (Addendum)
Pt in with co generalized weakness since 1200, pt is a dialysis pt last treatment was Saturday. Pt denies any pain, no recent illness. Daughter states hx of the same but unsure of cause.

## 2021-08-06 NOTE — Progress Notes (Signed)
[facility], [place] 08/06/21  Subjective:   LOS: 0  Patient known to our practice from previous admissions.  At this time she presents to the emergency room for feeling weakness, hurting in her "kidneys", feeling sweaty.  She is accompanied by her daughter.  Forde Dandy is through video [place] interpreter. She is otherwise able to eat okay.  Denies any vomiting.  She has been feeling nauseous occasionally.  No mention of fever, chills.  No dysuria.  She still produces urine.  Denies missing any treatment.  Denies drinking orange juice or eating excessive tomatoes Last HD was on Saturday  In the ER, she was found to have severely elevated potassium.  Urgent nephrology consult requested for dialysis.  Objective:  Vital signs in last 24 hours:  Temp:  [97.7 F (36.5 C)-97.8 F (36.6 C)] 97.8 F (36.6 C) (08/29 1914) Pulse Rate:  [34-81] 46 (08/29 2040) Resp:  [0-29] 19 (08/29 2015) BP: (119-149)/(38-62) 130/44 (08/29 2040) SpO2:  [95 %-100 %] 100 % (08/29 2040) Weight:  [45.4 kg] 45.4 kg (08/29 1904)  Weight change:  Filed Weights   08/06/21 1904  Weight: 45.4 kg    Intake/Output:   No intake or output data in the 24 hours ending 08/06/21 2101  Physical Exam: General:  No acute distress, laying in the bed  HEENT  anicteric, moist oral mucous membrane  Pulm/lungs  normal breathing effort, lungs are clear to auscultation  CVS/Heart  regular rhythm, no rub or gallop  Abdomen:   Soft, nontender  Extremities:  No peripheral edema  Neurologic:  Alert, oriented, able to follow commands  Skin:  No acute rashes  Right arm AV fistula  Basic Metabolic Panel:  Recent Labs  Lab 08/06/21 1920  NA 134*  K >7.5*  CL 96*  CO2 26  GLUCOSE 261*  BUN 74*  CREATININE 7.80*  CALCIUM 8.9     CBC: Recent Labs  Lab 08/06/21 1920  WBC 9.8  NEUTROABS 8.0*  HGB 10.8*  HCT 31.8*  MCV 101.0*  PLT 196      Lab Results  Component Value Date    HEPBSAG NON REACTIVE 08/08/2020   HEPBSAB Non Reactive 01/14/2017   HEPBIGM Negative 04/16/2016      Microbiology:  Recent Results (from the past 240 hour(s))  Resp Panel by RT-PCR (Flu A&B, Covid) Nasopharyngeal Swab     Status: None   Collection Time: 08/06/21  7:20 PM   Specimen: Nasopharyngeal Swab; Nasopharyngeal(NP) swabs in vial transport medium  Result Value Ref Range Status   SARS Coronavirus 2 by RT PCR NEGATIVE NEGATIVE Final    Comment: (NOTE) SARS-CoV-2 target nucleic acids are NOT DETECTED.  The SARS-CoV-2 RNA is generally detectable in upper respiratory specimens during the acute phase of infection. The lowest concentration of SARS-CoV-2 viral copies this assay can detect is 138 copies/mL. A negative result does not preclude SARS-Cov-2 infection and should not be used as the sole basis for treatment or other patient management decisions. A negative result may occur with  improper specimen collection/handling, submission of specimen other than nasopharyngeal swab, presence of viral mutation(s) within the areas targeted by this assay, and inadequate number of viral copies(<138 copies/mL). A negative result must be combined with clinical observations, patient history, and epidemiological information. The expected result is Negative.  Fact Sheet for Patients:  EntrepreneurPulse.com.au  Fact Sheet for Healthcare Providers:  IncredibleEmployment.be  This test is no t yet approved or cleared by the [place] and  has been authorized for detection and/or diagnosis of SARS-CoV-2 by FDA under an Emergency Use Authorization (EUA). This EUA will remain  in effect (meaning this test can be used) for the duration of the COVID-19 declaration under Section 564(b)(1) of the Act, 21 U.S.C.section 360bbb-3(b)(1), unless the authorization is terminated  or revoked sooner.       Influenza A by PCR NEGATIVE NEGATIVE Final   Influenza  B by PCR NEGATIVE NEGATIVE Final    Comment: (NOTE) The Xpert Xpress SARS-CoV-2/FLU/RSV plus assay is intended as an aid in the diagnosis of influenza from Nasopharyngeal swab specimens and should not be used as a sole basis for treatment. Nasal washings and aspirates are unacceptable for Xpert Xpress SARS-CoV-2/FLU/RSV testing.  Fact Sheet for Patients: EntrepreneurPulse.com.au  Fact Sheet for Healthcare Providers: IncredibleEmployment.be  This test is not yet approved or cleared by the [place] FDA and has been authorized for detection and/or diagnosis of SARS-CoV-2 by FDA under an Emergency Use Authorization (EUA). This EUA will remain in effect (meaning this test can be used) for the duration of the COVID-19 declaration under Section 564(b)(1) of the Act, 21 U.S.C. section 360bbb-3(b)(1), unless the authorization is terminated or revoked.  Performed at [facility], [address] [place], [place] [address]     Coagulation Studies: No results for input(s): LABPROT, INR in the last 72 hours.  Urinalysis: No results for input(s): COLORURINE, LABSPEC, PHURINE, GLUCOSEU, HGBUR, BILIRUBINUR, KETONESUR, PROTEINUR, UROBILINOGEN, NITRITE, LEUKOCYTESUR in the last 72 hours.  Invalid input(s): APPERANCEUR    Imaging: No results found.   Medications:     [START ON 08/07/2021] Chlorhexidine Gluconate Cloth  6 each Topical Q0600   heparin  5,000 Units Subcutaneous Q8H   [START ON 08/07/2021] insulin aspart  0-15 Units Subcutaneous Q4H   insulin aspart  10 Units Intravenous Once   pantoprazole (PROTONIX) IV  40 mg Intravenous QHS   docusate sodium, polyethylene glycol  Assessment/ Plan:  65 y.o. female with end-stage renal disease (4 to 5 years), diabetes type 2, hyperlipidemia, history of stroke, diabetic retinopathy was admitted on 08/06/2021 for  Active Problems:   Hyperkalemia  Hyperkalemia [E87.5]   [facility]  nephrology/TTS/dizziness [place]/right arm AV fistula  #.  Hyperkalemia with bradycardia in setting of ESRD We will arrange for urgent hemodialysis for symptomatic hyperkalemia causing bradycardia Patient has received shifting measures in the emergency room including insulin IV, dextrose, sodium bicarbonate, albuterol -Urgent hemodialysis 1K dialysate. -Dialysis nurse has been contacted  #. Anemia of CKD  Lab Results  Component Value Date   HGB 10.8 (L) 08/06/2021   Low dose EPO with HD  #. Secondary hyperparathyroidism of renal origin N 25.81      Component Value Date/Time   PTH 183 (H) 11/09/2016 0541   Lab Results  Component Value Date   PHOS 1.7 (L) 02/24/2018   Monitor calcium and phos level during this admission   #. Diabetes type 2 with CKD Hgb A1c MFr Bld (%)  Date Value  08/08/2020 6.4 (H)     LOS: 0 Anita Contreras 8/29/20229:01 PM  [place] 53 min

## 2021-08-06 NOTE — Progress Notes (Addendum)
PHARMACY CONSULT NOTE - FOLLOW UP  Pharmacy Consult for Electrolyte Monitoring and Replacement   Recent Labs: Potassium (mmol/L)  Date Value  08/06/2021 >7.5 [facility])   Magnesium (mg/dL)  Date Value  01/15/2017 1.9   Calcium (mg/dL)  Date Value  08/06/2021 8.9   Albumin (g/dL)  Date Value  02/10/2021 4.1   Phosphorus (mg/dL)  Date Value  02/24/2018 1.7 (L)   Sodium (mmol/L)  Date Value  08/06/2021 134 (L)     Assessment: 65yo female who presents to the ED with concern for weakness and AMS. Pt last dialysis session was on Saturday. Pharmacy has been consulted for electrolyte management.   Goal of Therapy:  Electrolytes WNL  Plan:  K >7.5, pt to received IV insuline 10 units. Pt already received Ca gluconate All other electrolytes WNL Recheck electrolytes with AM labs  Sherilyn Banker ,PharmD Clinical Pharmacist 08/06/2021 8:33 PM

## 2021-08-06 NOTE — ED Notes (Signed)
MD Archie Balboa notified of Critical Potassium 7.5 (face to face)

## 2021-08-06 NOTE — Progress Notes (Signed)
Stat potassium result of 4.9 '@2225'$ . Acid bath changed from 1k+ to 2k+. Pt stable and resting.

## 2021-08-07 ENCOUNTER — Encounter (INDEPENDENT_AMBULATORY_CARE_PROVIDER_SITE_OTHER): Payer: Self-pay

## 2021-08-07 LAB — GLUCOSE, CAPILLARY
Glucose-Capillary: 104 mg/dL — ABNORMAL HIGH (ref 70–99)
Glucose-Capillary: 158 mg/dL — ABNORMAL HIGH (ref 70–99)
Glucose-Capillary: 323 mg/dL — ABNORMAL HIGH (ref 70–99)
Glucose-Capillary: 94 mg/dL (ref 70–99)

## 2021-08-07 LAB — HEPATITIS C ANTIBODY: HCV Ab: NONREACTIVE

## 2021-08-07 LAB — CBC
HCT: 24.6 % — ABNORMAL LOW (ref 36.0–46.0)
Hemoglobin: 8.4 g/dL — ABNORMAL LOW (ref 12.0–15.0)
MCH: 34 pg (ref 26.0–34.0)
MCHC: 34.1 g/dL (ref 30.0–36.0)
MCV: 99.6 fL (ref 80.0–100.0)
Platelets: 170 10*3/uL (ref 150–400)
RBC: 2.47 MIL/uL — ABNORMAL LOW (ref 3.87–5.11)
RDW: 13.2 % (ref 11.5–15.5)
WBC: 7.2 10*3/uL (ref 4.0–10.5)
nRBC: 0 % (ref 0.0–0.2)

## 2021-08-07 LAB — HEPATITIS B CORE ANTIBODY, TOTAL: Hep B Core Total Ab: NONREACTIVE

## 2021-08-07 LAB — POTASSIUM: Potassium: 3.2 mmol/L — ABNORMAL LOW (ref 3.5–5.1)

## 2021-08-07 LAB — RENAL FUNCTION PANEL
Albumin: 3.5 g/dL (ref 3.5–5.0)
Anion gap: 11 (ref 5–15)
BUN: 24 mg/dL — ABNORMAL HIGH (ref 8–23)
CO2: 33 mmol/L — ABNORMAL HIGH (ref 22–32)
Calcium: 8.6 mg/dL — ABNORMAL LOW (ref 8.9–10.3)
Chloride: 97 mmol/L — ABNORMAL LOW (ref 98–111)
Creatinine, Ser: 3.56 mg/dL — ABNORMAL HIGH (ref 0.44–1.00)
GFR, Estimated: 14 mL/min — ABNORMAL LOW (ref >60)
Glucose, Bld: 94 mg/dL (ref 70–99)
Phosphorus: 3.4 mg/dL (ref 2.5–4.6)
Potassium: 5.2 mmol/L — ABNORMAL HIGH (ref 3.5–5.1)
Sodium: 141 mmol/L (ref 135–145)

## 2021-08-07 LAB — PROTIME-INR
INR: 1 (ref 0.8–1.2)
Prothrombin Time: 13 seconds (ref 11.4–15.2)

## 2021-08-07 LAB — HEPATITIS B SURFACE ANTIGEN: Hepatitis B Surface Ag: NONREACTIVE

## 2021-08-07 LAB — HEPATITIS B SURFACE ANTIBODY,QUALITATIVE: Hep B S Ab: REACTIVE — AB

## 2021-08-07 LAB — MAGNESIUM: Magnesium: 1.8 mg/dL (ref 1.7–2.4)

## 2021-08-07 MED ORDER — AMLODIPINE BESYLATE 10 MG PO TABS
10.0000 mg | ORAL_TABLET | Freq: Every day | ORAL | Status: DC
  Filled 2021-08-07: qty 1

## 2021-08-07 MED ORDER — HYDRALAZINE HCL 20 MG/ML IJ SOLN
10.0000 mg | INTRAMUSCULAR | Status: DC | PRN
  Administered 2021-08-07: 10 mg via INTRAVENOUS
  Filled 2021-08-07: qty 1

## 2021-08-07 MED ORDER — SODIUM ZIRCONIUM CYCLOSILICATE 5 G PO PACK
10.0000 g | PACK | Freq: Once | ORAL | Status: AC
  Administered 2021-08-07: 10 g via ORAL
  Filled 2021-08-07: qty 2

## 2021-08-07 NOTE — Progress Notes (Signed)
[facility], [place] 08/07/21  Subjective:   LOS: 1  Patient known to our practice from previous admissions.  At this time she presents to the emergency room for feeling weakness, hurting in her "kidneys", feeling sweaty.  She is accompanied by her daughter.  Forde Dandy is through video [employer] interpreter. Patient underwent emergent hemodialysis yesterday.  Tolerated well This morning potassium level has improved to 5.2  Objective:  Vital signs in last 24 hours:  Temp:  [97.7 F (36.5 C)-98.4 F (36.9 C)] 98.2 F (36.8 C) (08/30 0800) Pulse Rate:  [34-91] 68 (08/30 0900) Resp:  [0-29] 12 (08/30 0900) BP: (103-195)/(38-104) 175/46 (08/30 0900) SpO2:  [95 %-100 %] 99 % (08/30 0900) Weight:  [45.4 kg-53.8 kg] 53.8 kg (08/30 0500)  Weight change:  Filed Weights   08/06/21 1904 08/06/21 2145 08/07/21 0500  Weight: 45.4 kg 52.2 kg 53.8 kg    Intake/Output:    Intake/Output Summary (Last 24 hours) at 08/07/2021 0956 Last data filed at 08/07/2021 0030 Gross per 24 hour  Intake --  Output -152 ml  Net 152 ml    Physical Exam: General:  No acute distress, laying in the bed  HEENT  anicteric, moist oral mucous membrane  Pulm/lungs  normal breathing effort, lungs are clear to auscultation  CVS/Heart  regular rhythm, no rub or gallop  Abdomen:   Soft, nontender  Extremities:  No peripheral edema  Neurologic:  Alert, oriented, able to follow commands  Skin:  No acute rashes  Right arm AV fistula  Basic Metabolic Panel:  Recent Labs  Lab 08/06/21 1920 08/06/21 2225 08/07/21 0051 08/07/21 0607  NA 134*  --   --  141  K >7.5* 4.9 3.2* 5.2*  CL 96*  --   --  97*  CO2 26  --   --  33*  GLUCOSE 261*  --   --  94  BUN 74*  --   --  24*  CREATININE 7.80*  --   --  3.56*  CALCIUM 8.9  --   --  8.6*  MG 2.7*  --   --  1.8  PHOS 2.3*  --   --  3.4      CBC: Recent Labs  Lab 08/06/21 1920 08/07/21 0607  WBC 9.8 7.2  NEUTROABS 8.0*  --    HGB 10.8* 8.4*  HCT 31.8* 24.6*  MCV 101.0* 99.6  PLT 196 170       Lab Results  Component Value Date   HEPBSAG NON REACTIVE 08/06/2021   HEPBSAB Non Reactive 01/14/2017   HEPBIGM Negative 04/16/2016      Microbiology:  Recent Results (from the past 240 hour(s))  Resp Panel by RT-PCR (Flu A&B, Covid) Nasopharyngeal Swab     Status: None   Collection Time: 08/06/21  7:20 PM   Specimen: Nasopharyngeal Swab; Nasopharyngeal(NP) swabs in vial transport medium  Result Value Ref Range Status   SARS Coronavirus 2 by RT PCR NEGATIVE NEGATIVE Final    Comment: (NOTE) SARS-CoV-2 target nucleic acids are NOT DETECTED.  The SARS-CoV-2 RNA is generally detectable in upper respiratory specimens during the acute phase of infection. The lowest concentration of SARS-CoV-2 viral copies this assay can detect is 138 copies/mL. A negative result does not preclude SARS-Cov-2 infection and should not be used as the sole basis for treatment or other patient management decisions. A negative result may occur with  improper specimen collection/handling, submission of specimen other than nasopharyngeal swab, presence of viral  mutation(s) within the areas targeted by this assay, and inadequate number of viral copies(<138 copies/mL). A negative result must be combined with clinical observations, patient history, and epidemiological information. The expected result is Negative.  Fact Sheet for Patients:  EntrepreneurPulse.com.au  Fact Sheet for Healthcare Providers:  IncredibleEmployment.be  This test is no t yet approved or cleared by the [place] FDA and  has been authorized for detection and/or diagnosis of SARS-CoV-2 by FDA under an Emergency Use Authorization (EUA). This EUA will remain  in effect (meaning this test can be used) for the duration of the COVID-19 declaration under Section 564(b)(1) of the Act, 21 U.S.C.section 360bbb-3(b)(1), unless  the authorization is terminated  or revoked sooner.       Influenza A by PCR NEGATIVE NEGATIVE Final   Influenza B by PCR NEGATIVE NEGATIVE Final    Comment: (NOTE) The Xpert Xpress SARS-CoV-2/FLU/RSV plus assay is intended as an aid in the diagnosis of influenza from Nasopharyngeal swab specimens and should not be used as a sole basis for treatment. Nasal washings and aspirates are unacceptable for Xpert Xpress SARS-CoV-2/FLU/RSV testing.  Fact Sheet for Patients: EntrepreneurPulse.com.au  Fact Sheet for Healthcare Providers: IncredibleEmployment.be  This test is not yet approved or cleared by the [place] FDA and has been authorized for detection and/or diagnosis of SARS-CoV-2 by FDA under an Emergency Use Authorization (EUA). This EUA will remain in effect (meaning this test can be used) for the duration of the COVID-19 declaration under Section 564(b)(1) of the Act, 21 U.S.C. section 360bbb-3(b)(1), unless the authorization is terminated or revoked.  Performed at [facility], [address] [place], [place] [address]   MRSA Next Gen by PCR, Nasal     Status: None   Collection Time: 08/06/21  9:51 PM   Specimen: Nasal Mucosa; Nasal Swab  Result Value Ref Range Status   MRSA by PCR Next Gen NOT DETECTED NOT DETECTED Final    Comment: (NOTE) The GeneXpert MRSA Assay (FDA approved for NASAL specimens only), is one component of a comprehensive MRSA colonization surveillance program. It is not intended to diagnose MRSA infection nor to guide or monitor treatment for MRSA infections. Test performance is not FDA approved in patients less than 64 years old. Performed at [facility], [address] [place], [place] [address]     Coagulation Studies: Recent Labs    08/07/21 0053  LABPROT 13.0  INR 1.0    Urinalysis: No results for input(s): COLORURINE, LABSPEC, PHURINE, GLUCOSEU, HGBUR, BILIRUBINUR,  KETONESUR, PROTEINUR, UROBILINOGEN, NITRITE, LEUKOCYTESUR in the last 72 hours.  Invalid input(s): APPERANCEUR    Imaging: No results found.   Medications:     amLODipine  10 mg Oral Daily   Chlorhexidine Gluconate Cloth  6 each Topical Q0600   heparin  5,000 Units Subcutaneous Q8H   insulin aspart  0-15 Units Subcutaneous Q4H   pantoprazole (PROTONIX) IV  40 mg Intravenous QHS   docusate sodium, heparin, hydrALAZINE, polyethylene glycol  Assessment/ Plan:  65 y.o. female with end-stage renal disease (4 to 5 years), diabetes type 2, hyperlipidemia, history of stroke, diabetic retinopathy was admitted on 08/06/2021 for  Active Problems:   End stage renal disease ([place])   Hyperlipidemia   Essential hypertension   Type 2 diabetes mellitus with ESRD (end-stage renal disease) (HCC)   Hyperkalemia   Sinus bradycardia   Weakness  Hyperkalemia [E87.5] Sinus bradycardia [R00.1] Weakness [R53.1]   [facility] nephrology/TTS/dizziness [place]/right arm AV fistula  #.  Hyperkalemia  with bradycardia in setting of ESRD Urgent hemodialysis last night This morning potassium is 5.2.  Given 1 dose of oral Lokelma -In light of hyperkalemia and prolonged postdialysis bleeding yesterday, recirculation in the dialysis access is suspected.  Contacted vascular surgery.  Patient is set up for outpatient angiogram and evaluation on Thursday.  She will go to her regular hemodialysis treatment tomorrow.  #. Anemia of CKD  Lab Results  Component Value Date   HGB 8.4 (L) 08/07/2021   Low dose EPO with HD Continue as outpatient  #. Secondary hyperparathyroidism of renal origin N 25.81      Component Value Date/Time   PTH 183 (H) 11/09/2016 0541   Lab Results  Component Value Date   PHOS 3.4 08/07/2021   Monitor calcium and phos level during this admission   #. Diabetes type 2 with CKD Hgb A1c MFr Bld (%)  Date Value  08/08/2020 6.4 (H)     LOS: [place] 8/30/20229:56  AM  [place], [facility] 53 min

## 2021-08-07 NOTE — Progress Notes (Signed)
Pt HD ended 30 minutes early d/t arterial alarm and air detector alarm. Post HD prolonged bleeding >30 from arterial site. Primary staff present and aware. H. Candiss Norse, MD notified by this RN. Post HD patient sbp <190 and heart rate regular again. -152 total fluid.

## 2021-08-07 NOTE — Progress Notes (Signed)
ESRD patient admitted with bradycardia and hyperkalemia requiring emergent dialysis.  Now stable and ready to get out of ICU.  TRH to resume care on 08/08/2021

## 2021-08-07 NOTE — Discharge Summary (Signed)
Physician Discharge Summary  Patient ID: Anita Contreras MRN: [id number] DOB/AGE: 1956/10/07 65 y.o.  Admit date: 08/06/2021 Discharge date: 08/07/2021   Discharge Diagnoses:   Hyperkalemia Bradycardia/Accelerated Junctional Rhythm ESRD on Hemodialysis Diabetes Mellitus Type II                                                  Discharge Summary:  65 year old female presented to [facility] ED on 08/06/2021 from home with complaints of generalized weakness and altered mental status.   History obtained bedside via daughter and patient with Spanish interpreter assistance.  The patient's daughter reports that she was in her normal state of health until yesterday 08/05/2021 when she began having " kidney pain" and complaining of nausea.  Because of the nausea the patient was unable to tolerate very much food.  Then patient confirms around noon on 08/06/2021 she became very weak requiring assistance to walk.  The patient admits to feeling cold, with continuing nausea/ generalized weakness, slight dizziness upon standing & the " kidney pain" is described as bilateral flank pain, unclear on severity of pain. Both the patient and daughter deny fevers, vomiting, diarrhea, and no current complaints of pain & no known sick contacts. Family confirms patient received her normal dialysis session on Saturday, and denied any recent dietary changes including specifically drinking orange juice and/or eating excessive tomatoes. ED course: Initial vitals: afebrile-T: 97.8, RR stable at 13, bradycardic- 34, BP stable: 123/39 (63) & SpO2 99% on room air Labs/ Imaging personally reviewed Significant labs: severely hyperkalemic at >7.5, Na+: 134, BUN/Cr.: 74/ 7.8, Serum CO2/ AG: 26/ 12, Hgb: 10.8, Troponin: 13, WBC: 9.8, COVID-19 & influenza A/B: Negative I, Domingo Pulse [place], AGACNP-BC, personally viewed and interpreted this ECG. EKG Interpretation Date: 08/06/21 EKG Time: 20:08 Rate: 70 Rhythm: Accelerated  Junctional  QRS Axis:  extreme axis Intervals: prolonged QTc, BBB ST/T Wave abnormalities: peaked T waves Narrative Interpretation: Accelerated junctional rhythm with peaked T waves   Upon arrival to ED patient was significantly bradycardic in the 30s with correlating altered mental status.  Serial EKGs reveal peaked T waves and intermittent  accelerated junctional rhythm.  Patient received 1 mg of atropine with some improvement in heart rate to the 60's with correlating improvement in mentation. EDP consulted Dr. Candiss Norse, nephrology who arrived bedside and consented the patient for emergent HD. Patient received calcium gluconate, sodium bicarb, insulin IV & D 50 for hyperkalemia.   PCCM consulted for admission due to severe electrolyte disturbances    Discharge Plan by Diagnosis:   Hyperkalemia ESRD on HD -Hyperkalemia resolved with emergent HD -Follow normal HD schedule (T,Th,S) and follow up with Nephrology -Continue home medications: lasix, calcium acetate  -Continue with scheduled appointment with Vascular Surgery on 08/08/21 for Angiogram to evaluate HD access   Bradycardia & Accelerated Junctional rhythm due to severe hyperkalemia>>resolved PMHx: HTN, HLD -Continue Outpatient medications: amlodipine & losartan, Lipitor   Poorly controlled Type 2 Diabetes Mellitus -Hgb A1c pending -Continue home insulin NPH-regular Human (70/30)    Significant Events:  08/06/21: Patient admitted to ICU for emergent HD due to severe hyperkalemia with EKG changes 08/07/21: Improved following emergent HD.  Cleared for discharge home                 Significant Diagnostic Studies:  None  Micro Data:  08/06/2021: SARS-CoV-2 & Influenza PCR>>negative 08/06/2021: MRSA PCR>>negative  Antimicrobials:  None required  Consults:  PCCM Nephrology  Discharge Exam:  General: Adult female, acutely ill, lying in bed, frail appearing, NAD HEENT: MM pink/moist, anicteric, atraumatic, neck  supple, unable to see from L eye at baseline  Neuro: A&O x 3, able to follow commands, PERRL L not reactive/ R sluggish, MAE CV: s1s2, SB on monitor, no r/m/g Pulm: Regular, non labored on RA, breath sounds clear-BUL & diminished-BLL GI: soft, rounded, non tender, bs x 4 Skin: scattered ecchymosis, no rashes/lesions noted Extremities: warm/dry, pulses + 2 R/P, no edema noted  Vitals:   08/07/21 0700 08/07/21 0800 08/07/21 0900 08/07/21 1000  BP: (!) 160/55 (!) 141/47 (!) 175/46 (!) 152/61  Pulse: 67 68 68 67  Resp: _0 Temp:  98.2 F (36.8 C)    TempSrc:      SpO2: 96% 100% 99% 98%  Weight:      Height:         Discharge Labs:   BMET Recent Labs  Lab 08/06/21 1920 08/06/21 2225 08/07/21 0051 08/07/21 0607  NA 134*  --   --  141  K >7.5*   < > 3.2* 5.2*  CL 96*  --   --  97*  CO2 26  --   --  33*  GLUCOSE 261*  --   --  94  BUN 74*  --   --  24*  CREATININE 7.80*  --   --  3.56*  CALCIUM 8.9  --   --  8.6*  MG 2.7*  --   --  1.8  PHOS 2.3*  --   --  3.4   < > = values in this interval not displayed.    CBC Recent Labs  Lab 08/06/21 1920 08/07/21 0607  HGB 10.8* 8.4*  HCT 31.8* 24.6*  WBC 9.8 7.2  PLT 196 170    Anti-Coagulation Recent Labs  Lab 08/07/21 0053  INR 1.0          Allergies as of 08/07/2021       Reactions   Dextromethorphan-guaifenesin    Other reaction(s): Itching of skin (finding)   Propofol Itching   Pt says " when getting anesthesia by body itches" unsure what type of anesthesia        Medication List     TAKE these medications    acetaminophen 500 MG tablet Commonly known as: TYLENOL Take 1,000 mg by mouth every 6 (six) hours as needed for mild pain or headache.   amLODipine 10 MG tablet Commonly known as: NORVASC Take 1 tablet (10 mg total) by mouth daily.   aspirin 81 MG EC tablet TAKE 1 TABLET BY MOUTH EVERY DAY   atorvastatin 40 MG tablet Commonly known as: LIPITOR Take 40 mg by mouth daily  at 6 PM.   calcium acetate 667 MG capsule Commonly known as: PHOSLO Take 1 capsule (667 mg total) by mouth 3 (three) times daily with meals.   furosemide 20 MG tablet Commonly known as: LASIX Take 20 mg by mouth daily.   hydrOXYzine 50 MG tablet Commonly known as: ATARAX/VISTARIL Take 25 mg by mouth 2 (two) times daily as needed for itching.   insulin NPH-regular Human (70-30) 100 UNIT/ML injection Inject 3 Units into the skin 2 (two) times daily with a meal.   lidocaine-prilocaine cream Commonly known as: EMLA Apply 1 application topically as needed.   losartan 50  MG tablet Commonly known as: COZAAR Take 1 tablet (50 mg total) by mouth daily.   pantoprazole 40 MG tablet Commonly known as: PROTONIX Take 1 tablet (40 mg total) by mouth daily.            Disposition: Home  Discharged Condition: Anita Contreras has met maximum benefit of inpatient care and is medically stable and cleared for discharge.  Patient is pending follow up as above.      Time spent on disposition:  50 Minutes.     Signed: Darel Hong, AGACNP-BC [facility] Prefer epic messenger for cross cover needs If after hours, please call E-link

## 2021-08-08 LAB — HEMOGLOBIN A1C
Hgb A1c MFr Bld: 6 % — ABNORMAL HIGH (ref 4.8–5.6)
Mean Plasma Glucose: 126 mg/dL

## 2021-08-09 ENCOUNTER — Ambulatory Visit
Admission: RE | Disposition: A | Discharge status: Home / Self Care | Payer: Self-pay | Source: Home / Self Care | Attending: Vascular Surgery

## 2021-08-09 ENCOUNTER — Other Ambulatory Visit (INDEPENDENT_AMBULATORY_CARE_PROVIDER_SITE_OTHER): Payer: Self-pay | Admitting: Nurse Practitioner

## 2021-08-09 ENCOUNTER — Ambulatory Visit
Admission: RE | Admit: 2021-08-09 | Discharge: 2021-08-09 | Disposition: A | Discharge status: Home / Self Care | Payer: Self-pay | Attending: Vascular Surgery | Admitting: Vascular Surgery

## 2021-08-09 ENCOUNTER — Encounter (INDEPENDENT_AMBULATORY_CARE_PROVIDER_SITE_OTHER): Payer: Self-pay | Admitting: Nurse Practitioner

## 2021-08-09 ENCOUNTER — Encounter: Payer: Self-pay | Admitting: Vascular Surgery

## 2021-08-09 DIAGNOSIS — E875 Hyperkalemia: Secondary | ICD-10-CM | POA: Insufficient documentation

## 2021-08-09 DIAGNOSIS — Z884 Allergy status to anesthetic agent status: Secondary | ICD-10-CM | POA: Insufficient documentation

## 2021-08-09 DIAGNOSIS — N186 End stage renal disease: Secondary | ICD-10-CM | POA: Insufficient documentation

## 2021-08-09 DIAGNOSIS — T82858A Stenosis of vascular prosthetic devices, implants and grafts, initial encounter: Secondary | ICD-10-CM

## 2021-08-09 DIAGNOSIS — Z992 Dependence on renal dialysis: Secondary | ICD-10-CM | POA: Insufficient documentation

## 2021-08-09 DIAGNOSIS — Z794 Long term (current) use of insulin: Secondary | ICD-10-CM | POA: Insufficient documentation

## 2021-08-09 DIAGNOSIS — Z79899 Other long term (current) drug therapy: Secondary | ICD-10-CM | POA: Insufficient documentation

## 2021-08-09 DIAGNOSIS — I120 Hypertensive chronic kidney disease with stage 5 chronic kidney disease or end stage renal disease: Secondary | ICD-10-CM | POA: Insufficient documentation

## 2021-08-09 DIAGNOSIS — Z7982 Long term (current) use of aspirin: Secondary | ICD-10-CM | POA: Insufficient documentation

## 2021-08-09 DIAGNOSIS — Z888 Allergy status to other drugs, medicaments and biological substances status: Secondary | ICD-10-CM | POA: Insufficient documentation

## 2021-08-09 DIAGNOSIS — Y841 Kidney dialysis as the cause of abnormal reaction of the patient, or of later complication, without mention of misadventure at the time of the procedure: Secondary | ICD-10-CM | POA: Insufficient documentation

## 2021-08-09 DIAGNOSIS — R001 Bradycardia, unspecified: Secondary | ICD-10-CM | POA: Insufficient documentation

## 2021-08-09 DIAGNOSIS — E1122 Type 2 diabetes mellitus with diabetic chronic kidney disease: Secondary | ICD-10-CM | POA: Insufficient documentation

## 2021-08-09 HISTORY — PX: A/V FISTULAGRAM: CATH118298

## 2021-08-09 LAB — POTASSIUM
Potassium: 3.8 mmol/L (ref 3.5–5.1)
Potassium: 3.3 mmol/L — ABNORMAL LOW (ref 3.5–5.1)

## 2021-08-09 LAB — POTASSIUM (ARMC VASCULAR LAB ONLY): Potassium (ARMC vascular lab): 6.4 (ref 3.5–5.1)

## 2021-08-09 LAB — GLUCOSE, CAPILLARY: Glucose-Capillary: 109 mg/dL — ABNORMAL HIGH (ref 70–99)

## 2021-08-09 SURGERY — A/V FISTULAGRAM
Anesthesia: Moderate Sedation | Laterality: Right

## 2021-08-09 MED ORDER — MIDAZOLAM HCL 2 MG/2ML IJ SOLN
INTRAMUSCULAR | Status: DC | PRN
  Administered 2021-08-09: 1 mg via INTRAVENOUS

## 2021-08-09 MED ORDER — LIDOCAINE HCL (PF) 1 % IJ SOLN: 5.0000 mL | INTRAMUSCULAR | Status: DC | PRN

## 2021-08-09 MED ORDER — CHLORHEXIDINE GLUCONATE CLOTH 2 % EX PADS: 6.0000 | MEDICATED_PAD | Freq: Every day | CUTANEOUS | Status: DC

## 2021-08-09 MED ORDER — FAMOTIDINE 20 MG PO TABS: 40.0000 mg | ORAL_TABLET | Freq: Once | ORAL | Status: DC | PRN

## 2021-08-09 MED ORDER — SODIUM CHLORIDE 0.9 % IV SOLN: INTRAVENOUS | Status: DC

## 2021-08-09 MED ORDER — HYDROMORPHONE HCL 1 MG/ML IJ SOLN: 1.0000 mg | Freq: Once | INTRAMUSCULAR | Status: DC | PRN

## 2021-08-09 MED ORDER — MIDAZOLAM HCL 2 MG/2ML IJ SOLN
INTRAMUSCULAR | Status: AC
  Filled 2021-08-09: qty 2

## 2021-08-09 MED ORDER — FENTANYL CITRATE (PF) 100 MCG/2ML IJ SOLN
INTRAMUSCULAR | Status: DC | PRN
  Administered 2021-08-09: 50 ug via INTRAVENOUS

## 2021-08-09 MED ORDER — HEPARIN SODIUM (PORCINE) 1000 UNIT/ML IJ SOLN
INTRAMUSCULAR | Status: AC
  Filled 2021-08-09: qty 1

## 2021-08-09 MED ORDER — CEFAZOLIN SODIUM-DEXTROSE 1-4 GM/50ML-% IV SOLN: 1.0000 g | Freq: Once | INTRAVENOUS | Status: DC

## 2021-08-09 MED ORDER — METHYLPREDNISOLONE SODIUM SUCC 125 MG IJ SOLR: 125.0000 mg | Freq: Once | INTRAMUSCULAR | Status: DC | PRN

## 2021-08-09 MED ORDER — SODIUM CHLORIDE 0.9 % IV SOLN: 100.0000 mL | INTRAVENOUS | Status: DC | PRN

## 2021-08-09 MED ORDER — ONDANSETRON HCL 4 MG/2ML IJ SOLN: 4.0000 mg | Freq: Four times a day (QID) | INTRAMUSCULAR | Status: DC | PRN

## 2021-08-09 MED ORDER — MIDAZOLAM HCL 2 MG/ML PO SYRP: 8.0000 mg | ORAL_SOLUTION | Freq: Once | ORAL | Status: DC | PRN

## 2021-08-09 MED ORDER — DIPHENHYDRAMINE HCL 50 MG/ML IJ SOLN: 50.0000 mg | Freq: Once | INTRAMUSCULAR | Status: DC | PRN

## 2021-08-09 MED ORDER — HEPARIN SODIUM (PORCINE) 1000 UNIT/ML IJ SOLN
INTRAMUSCULAR | Status: DC | PRN
  Administered 2021-08-09: 3000 [IU] via INTRAVENOUS

## 2021-08-09 MED ORDER — LIDOCAINE-PRILOCAINE 2.5-2.5 % EX CREA: 1.0000 "application " | TOPICAL_CREAM | CUTANEOUS | Status: DC | PRN

## 2021-08-09 MED ORDER — CEFAZOLIN SODIUM-DEXTROSE 1-4 GM/50ML-% IV SOLN
INTRAVENOUS | Status: AC
  Filled 2021-08-09: qty 50

## 2021-08-09 MED ORDER — PENTAFLUOROPROP-TETRAFLUOROETH EX AERO: 1.0000 "application " | INHALATION_SPRAY | CUTANEOUS | Status: DC | PRN

## 2021-08-09 MED ORDER — FENTANYL CITRATE PF 50 MCG/ML IJ SOSY
PREFILLED_SYRINGE | INTRAMUSCULAR | Status: AC
  Filled 2021-08-09: qty 1

## 2021-08-09 MED ORDER — IODIXANOL 320 MG/ML IV SOLN
INTRAVENOUS | Status: DC | PRN
  Administered 2021-08-09: 25 mL

## 2021-08-09 MED ORDER — HEPARIN SODIUM (PORCINE) 1000 UNIT/ML DIALYSIS: 1000.0000 [IU] | INTRAMUSCULAR | Status: DC | PRN

## 2021-08-09 SURGICAL SUPPLY — 10 items
BALLN LUTONIX AV 8X60X75 (BALLOONS) ×2
BALLOON LUTONIX AV 8X60X75 (BALLOONS) ×1 IMPLANT
DRAPE BRACHIAL (DRAPES) ×2 IMPLANT
KIT ENCORE 26 ADVANTAGE (KITS) ×2 IMPLANT
KIT MICROPUNCTURE NIT STIFF (SHEATH) ×2 IMPLANT
NEEDLE ENTRY 21GA 7CM ECHOTIP (NEEDLE) ×2 IMPLANT
PACK ANGIOGRAPHY (CUSTOM PROCEDURE TRAY) ×2 IMPLANT
SHEATH BRITE TIP 6FRX5.5 (SHEATH) ×2 IMPLANT
SUT MNCRL AB 4-0 PS2 18 (SUTURE) ×2 IMPLANT
WIRE MAGIC TOR.035 180C (WIRE) ×2 IMPLANT

## 2021-08-09 NOTE — Op Note (Signed)
[facility]    OPERATIVE NOTE   PROCEDURE: 1.   Right brachiocephalic arteriovenous fistula cannulation under ultrasound guidance 2.   Right arm fistulagram including central venogram 3.   PTA of the cephalic vein subclavian confluence with 8 mm diameter by 6 cm length Lutonix drug-coated angioplasty balloon  PRE-OPERATIVE DIAGNOSIS: 1. ESRD 2. Poorly functional right brachiocephalic AVF  POST-OPERATIVE DIAGNOSIS: same as above   SURGEON: [name], MD  ANESTHESIA: local with MCS  ESTIMATED BLOOD LOSS: 3 cc  FINDING(S): This demonstrated the right brachiocephalic AV fistula to be patent in the arm including the previously placed stents, but there was about a 70% stenosis at the edge of the previously placed stent at the cephalic vein subclavian vein confluence.  The remainder the central venous circulation was widely patent.  SPECIMEN(S):  None  CONTRAST: 20 cc  FLUORO TIME: 0.8 minutes  MODERATE CONSCIOUS SEDATION TIME: Approximately 16 minutes with 1 mg of Versed and 50 mcg of Fentanyl   INDICATIONS: Anita Contreras is a 65 y.o. female who presents with malfunctioning right brachiocephalic arteriovenous fistula.  The patient is scheduled for right arm fistulagram.  The patient is aware the risks include but are not limited to: bleeding, infection, thrombosis of the cannulated access, and possible anaphylactic reaction to the contrast.  The patient is aware of the risks of the procedure and elects to proceed forward.  DESCRIPTION: After full informed written consent was obtained, the patient was brought back to the angiography suite and placed supine upon the angiography table.  The patient was connected to monitoring equipment. Moderate conscious sedation was administered with a face to face encounter with the patient throughout the procedure with my supervision of the RN administering medicines and monitoring the patient's vital signs and  mental status throughout from the start of the procedure until the patient was taken to the recovery room. The right arm was prepped and draped in the standard fashion for a percutaneous access intervention.  Under ultrasound guidance, the right brachiocephalic arteriovenous fistula was cannulated with a micropuncture needle under direct ultrasound guidance where it was patent and a permanent image was performed.  The microwire was advanced into the fistula and the needle was exchanged for the a microsheath.  I then upsized to a 6 Fr Sheath and imaging was performed.  Hand injections were completed to image the access including the central venous system. This demonstrated the right brachiocephalic AV fistula to be patent in the arm including the previously placed stents, but there was about a 70% stenosis at the edge of the previously placed stent at the cephalic vein subclavian vein confluence.  The remainder the central venous circulation was widely patent.  Based on the images, this patient will need intervention to this stenosis. I then gave the patient 3000 units of intravenous heparin.  I then crossed the stenosis with a Magic Tourqe wire.  Based on the imaging, a 8 mm x 6 cm Lutonix drug-coated angioplasty balloon was selected.  The balloon was centered around the cephalic vein subclavian vein confluence stenosis and inflated to 10 ATM for 1 minute(s).  On completion imaging, a 20% residual stenosis was present.     Based on the completion imaging, no further intervention is necessary.  The wire and balloon were removed from the sheath.  A 4-0 Monocryl purse-string suture was sewn around the sheath.  The sheath was removed while tying down the suture.  A sterile bandage was applied to the  puncture site.  COMPLICATIONS: None  CONDITION: Stable   [name]  08/09/2021 1:13 PM   This note was created with Dragon Medical transcription system. Any errors in dictation are purely unintentional.

## 2021-08-09 NOTE — Progress Notes (Signed)
Pt. post vascular procedure, with RUA AVF, cannulated without difficulty, maintained prescribed BFR, reached targeted UFR, fld removed 1 liter. Labs drawn throughout tx per MD order. Pt discharged from unit to home, [facility] contacted as requested.

## 2021-08-09 NOTE — Interval H&P Note (Signed)
History and Physical Interval Note:  08/09/2021 10:48 AM  Anita Contreras  has presented today for surgery, with the diagnosis of RT Arm Fistulagram   ESRD     INTERPRETER REQUESTED.  The various methods of treatment have been discussed with the patient and family. After consideration of risks, benefits and other options for treatment, the patient has consented to  Procedure(s): A/V FISTULAGRAM (Right) as a surgical intervention.  The patient's history has been reviewed, patient examined, no change in status, stable for surgery.  I have reviewed the patient's chart and labs.  Questions were answered to the patient's satisfaction.     [name]

## 2021-08-09 NOTE — Progress Notes (Signed)
[facility], [place] 08/09/21  Subjective:   LOS: 0  Anita Contreras is a 65 y.o. [place] speaking female seen in special procedures. She presents for right arm fisulagram and found to have a potassium >6.   Patient states she was not given dialysis yesterday due to HD access issues. Visit conducted with spanish interpretor. Denies pain, denies loss of appetite, diarrhea, nausea and vomiting. Denies shortness of breath.   Objective:  Vital signs in last 24 hours:  Temp:  [98.1 F (36.7 C)] 98.1 F (36.7 C) (09/01 1018) Pulse Rate:  [69-74] 74 (09/01 1330) Resp:  [15-23] 23 (09/01 1345) BP: (156-180)/(49-65) 175/49 (09/01 1330) SpO2:  [93 %-99 %] 94 % (09/01 1330) Weight:  [45.4 kg] 45.4 kg (09/01 1018)  Weight change:  Filed Weights   08/09/21 1018  Weight: 45.4 kg    Intake/Output:   No intake or output data in the 24 hours ending 08/09/21 1545   Physical Exam: General:  No acute distress, laying on stretcher  HEENT  anicteric, moist oral mucous membrane  Pulm/lungs  normal breathing effort, lungs are clear to auscultation  CVS/Heart  regular rhythm, no rub or gallop  Abdomen:   Soft, nontender  Extremities:  No peripheral edema  Neurologic:  Alert, oriented, able to follow commands  Skin:  No acute rashes  Right arm AV fistula  Basic Metabolic Panel:  Recent Labs  Lab 08/06/21 1920 08/06/21 2225 08/07/21 0051 08/07/21 0607  NA 134*  --   --  141  K >7.5* 4.9 3.2* 5.2*  CL 96*  --   --  97*  CO2 26  --   --  33*  GLUCOSE 261*  --   --  94  BUN 74*  --   --  24*  CREATININE 7.80*  --   --  3.56*  CALCIUM 8.9  --   --  8.6*  MG 2.7*  --   --  1.8  PHOS 2.3*  --   --  3.4      CBC: Recent Labs  Lab 08/06/21 1920 08/07/21 0607  WBC 9.8 7.2  NEUTROABS 8.0*  --   HGB 10.8* 8.4*  HCT 31.8* 24.6*  MCV 101.0* 99.6  PLT 196 170       Lab Results  Component Value Date   HEPBSAG NON REACTIVE 08/06/2021    HEPBSAB Reactive (A) 08/06/2021   HEPBIGM Negative 04/16/2016      Microbiology:  Recent Results (from the past 240 hour(s))  Resp Panel by RT-PCR (Flu A&B, Covid) Nasopharyngeal Swab     Status: None   Collection Time: 08/06/21  7:20 PM   Specimen: Nasopharyngeal Swab; Nasopharyngeal(NP) swabs in vial transport medium  Result Value Ref Range Status   SARS Coronavirus 2 by RT PCR NEGATIVE NEGATIVE Final    Comment: (NOTE) SARS-CoV-2 target nucleic acids are NOT DETECTED.  The SARS-CoV-2 RNA is generally detectable in upper respiratory specimens during the acute phase of infection. The lowest concentration of SARS-CoV-2 viral copies this assay can detect is 138 copies/mL. A negative result does not preclude SARS-Cov-2 infection and should not be used as the sole basis for treatment or other patient management decisions. A negative result may occur with  improper specimen collection/handling, submission of specimen other than nasopharyngeal swab, presence of viral mutation(s) within the areas targeted by this assay, and inadequate number of viral copies(<138 copies/mL). A negative result must be combined with clinical observations, patient history, and epidemiological  information. The expected result is Negative.  Fact Sheet for Patients:  EntrepreneurPulse.com.au  Fact Sheet for Healthcare Providers:  IncredibleEmployment.be  This test is no t yet approved or cleared by the [place] FDA and  has been authorized for detection and/or diagnosis of SARS-CoV-2 by FDA under an Emergency Use Authorization (EUA). This EUA will remain  in effect (meaning this test can be used) for the duration of the COVID-19 declaration under Section 564(b)(1) of the Act, 21 U.S.C.section 360bbb-3(b)(1), unless the authorization is terminated  or revoked sooner.       Influenza A by PCR NEGATIVE NEGATIVE Final   Influenza B by PCR NEGATIVE NEGATIVE Final     Comment: (NOTE) The Xpert Xpress SARS-CoV-2/FLU/RSV plus assay is intended as an aid in the diagnosis of influenza from Nasopharyngeal swab specimens and should not be used as a sole basis for treatment. Nasal washings and aspirates are unacceptable for Xpert Xpress SARS-CoV-2/FLU/RSV testing.  Fact Sheet for Patients: EntrepreneurPulse.com.au  Fact Sheet for Healthcare Providers: IncredibleEmployment.be  This test is not yet approved or cleared by the [place] FDA and has been authorized for detection and/or diagnosis of SARS-CoV-2 by FDA under an Emergency Use Authorization (EUA). This EUA will remain in effect (meaning this test can be used) for the duration of the COVID-19 declaration under Section 564(b)(1) of the Act, 21 U.S.C. section 360bbb-3(b)(1), unless the authorization is terminated or revoked.  Performed at [facility], [address] [place], [place] [address]   MRSA Next Gen by PCR, Nasal     Status: None   Collection Time: 08/06/21  9:51 PM   Specimen: Nasal Mucosa; Nasal Swab  Result Value Ref Range Status   MRSA by PCR Next Gen NOT DETECTED NOT DETECTED Final    Comment: (NOTE) The GeneXpert MRSA Assay (FDA approved for NASAL specimens only), is one component of a comprehensive MRSA colonization surveillance program. It is not intended to diagnose MRSA infection nor to guide or monitor treatment for MRSA infections. Test performance is not FDA approved in patients less than 25 years old. Performed at [facility], [address] [place], [place] [address]     Coagulation Studies: Recent Labs    08/07/21 0053  LABPROT 13.0  INR 1.0     Urinalysis: No results for input(s): COLORURINE, LABSPEC, PHURINE, GLUCOSEU, HGBUR, BILIRUBINUR, KETONESUR, PROTEINUR, UROBILINOGEN, NITRITE, LEUKOCYTESUR in the last 72 hours.  Invalid input(s): APPERANCEUR    Imaging: PERIPHERAL VASCULAR  CATHETERIZATION  Result Date: 08/09/2021 See surgical note for result.    Medications:    sodium chloride     sodium chloride     ceFAZolin      Chlorhexidine Gluconate Cloth  6 each Topical Q0600   fentaNYL       heparin sodium (porcine)       midazolam       sodium chloride, sodium chloride, heparin, HYDROmorphone (DILAUDID) injection, lidocaine (PF), lidocaine-prilocaine, ondansetron (ZOFRAN) IV, pentafluoroprop-tetrafluoroeth  Assessment/ Plan:  65 y.o. female spanish speaking female seen in special procedures. She presents for right arm fisulagram and found to have a potassium >6.   RT Arm Fistulagram   ESRD     INTERPRETER REQUESTED   [facility] nephrology/TTS/dizziness [place]/right arm AV fistula  #.  Hyperkalemia with end stage renal disease on dialysis Last dialysis treatment on Monday. Dialysis not received yesterday due to HD access concerns. Potassium 6.2 prior to procedure. Will dialyze after procedure. Will order 1K bath with hourly potassium checks,  change to 2K when potassium <5. Patient will resume outpatient treatments once discharged.      LOS: 0 Reyes [place] 9/1/20223:45 PM

## 2021-08-10 ENCOUNTER — Encounter: Payer: Self-pay | Admitting: Vascular Surgery

## 2021-08-27 ENCOUNTER — Inpatient Hospital Stay
Admission: EM | Admit: 2021-08-27 | Discharge: 2021-08-31 | DRG: 640 | Disposition: A | Discharge status: Home Health | Payer: Medicaid Other | Attending: Internal Medicine | Admitting: Internal Medicine

## 2021-08-27 ENCOUNTER — Emergency Department: Payer: Medicaid Other

## 2021-08-27 ENCOUNTER — Encounter: Payer: Self-pay | Admitting: *Deleted

## 2021-08-27 ENCOUNTER — Emergency Department: Admission: EM | Admit: 2021-08-27 | Discharge: 2021-08-27 | Discharge status: Absent without leave | Payer: Self-pay

## 2021-08-27 ENCOUNTER — Other Ambulatory Visit: Payer: Self-pay

## 2021-08-27 DIAGNOSIS — Z794 Long term (current) use of insulin: Secondary | ICD-10-CM

## 2021-08-27 DIAGNOSIS — R55 Syncope and collapse: Secondary | ICD-10-CM | POA: Diagnosis present

## 2021-08-27 DIAGNOSIS — Z20822 Contact with and (suspected) exposure to covid-19: Secondary | ICD-10-CM | POA: Diagnosis present

## 2021-08-27 DIAGNOSIS — I959 Hypotension, unspecified: Secondary | ICD-10-CM | POA: Diagnosis present

## 2021-08-27 DIAGNOSIS — E785 Hyperlipidemia, unspecified: Secondary | ICD-10-CM | POA: Diagnosis present

## 2021-08-27 DIAGNOSIS — R001 Bradycardia, unspecified: Secondary | ICD-10-CM | POA: Diagnosis present

## 2021-08-27 DIAGNOSIS — E875 Hyperkalemia: Principal | ICD-10-CM | POA: Diagnosis present

## 2021-08-27 DIAGNOSIS — I120 Hypertensive chronic kidney disease with stage 5 chronic kidney disease or end stage renal disease: Secondary | ICD-10-CM | POA: Diagnosis present

## 2021-08-27 DIAGNOSIS — Z992 Dependence on renal dialysis: Secondary | ICD-10-CM

## 2021-08-27 DIAGNOSIS — R0902 Hypoxemia: Secondary | ICD-10-CM

## 2021-08-27 DIAGNOSIS — E1139 Type 2 diabetes mellitus with other diabetic ophthalmic complication: Secondary | ICD-10-CM | POA: Diagnosis present

## 2021-08-27 DIAGNOSIS — E11649 Type 2 diabetes mellitus with hypoglycemia without coma: Secondary | ICD-10-CM | POA: Diagnosis not present

## 2021-08-27 DIAGNOSIS — I248 Other forms of acute ischemic heart disease: Secondary | ICD-10-CM | POA: Diagnosis present

## 2021-08-27 DIAGNOSIS — Z8249 Family history of ischemic heart disease and other diseases of the circulatory system: Secondary | ICD-10-CM | POA: Diagnosis not present

## 2021-08-27 DIAGNOSIS — Z8349 Family history of other endocrine, nutritional and metabolic diseases: Secondary | ICD-10-CM | POA: Diagnosis not present

## 2021-08-27 DIAGNOSIS — R230 Cyanosis: Secondary | ICD-10-CM | POA: Diagnosis present

## 2021-08-27 DIAGNOSIS — D631 Anemia in chronic kidney disease: Secondary | ICD-10-CM | POA: Diagnosis present

## 2021-08-27 DIAGNOSIS — N186 End stage renal disease: Secondary | ICD-10-CM | POA: Diagnosis present

## 2021-08-27 DIAGNOSIS — E1122 Type 2 diabetes mellitus with diabetic chronic kidney disease: Secondary | ICD-10-CM | POA: Diagnosis present

## 2021-08-27 DIAGNOSIS — Z8673 Personal history of transient ischemic attack (TIA), and cerebral infarction without residual deficits: Secondary | ICD-10-CM | POA: Diagnosis not present

## 2021-08-27 DIAGNOSIS — M810 Age-related osteoporosis without current pathological fracture: Secondary | ICD-10-CM | POA: Diagnosis present

## 2021-08-27 DIAGNOSIS — Z7982 Long term (current) use of aspirin: Secondary | ICD-10-CM | POA: Diagnosis not present

## 2021-08-27 DIAGNOSIS — R509 Fever, unspecified: Secondary | ICD-10-CM | POA: Diagnosis not present

## 2021-08-27 DIAGNOSIS — Z888 Allergy status to other drugs, medicaments and biological substances status: Secondary | ICD-10-CM

## 2021-08-27 DIAGNOSIS — E1165 Type 2 diabetes mellitus with hyperglycemia: Secondary | ICD-10-CM | POA: Diagnosis not present

## 2021-08-27 DIAGNOSIS — Z79899 Other long term (current) drug therapy: Secondary | ICD-10-CM

## 2021-08-27 DIAGNOSIS — R4182 Altered mental status, unspecified: Secondary | ICD-10-CM

## 2021-08-27 DIAGNOSIS — J9601 Acute respiratory failure with hypoxia: Secondary | ICD-10-CM | POA: Diagnosis not present

## 2021-08-27 DIAGNOSIS — E559 Vitamin D deficiency, unspecified: Secondary | ICD-10-CM | POA: Diagnosis present

## 2021-08-27 DIAGNOSIS — Z884 Allergy status to anesthetic agent status: Secondary | ICD-10-CM

## 2021-08-27 DIAGNOSIS — I10 Essential (primary) hypertension: Secondary | ICD-10-CM | POA: Diagnosis present

## 2021-08-27 LAB — CBC WITH DIFFERENTIAL/PLATELET
Abs Immature Granulocytes: 0.05 10*3/uL (ref 0.00–0.07)
Basophils Absolute: 0 10*3/uL (ref 0.0–0.1)
Basophils Relative: 0 %
Eosinophils Absolute: 0 10*3/uL (ref 0.0–0.5)
Eosinophils Relative: 1 %
HCT: 27.8 % — ABNORMAL LOW (ref 36.0–46.0)
Hemoglobin: 8.7 g/dL — ABNORMAL LOW (ref 12.0–15.0)
Immature Granulocytes: 1 %
Lymphocytes Relative: 14 %
Lymphs Abs: 0.7 10*3/uL (ref 0.7–4.0)
MCH: 34.5 pg — ABNORMAL HIGH (ref 26.0–34.0)
MCHC: 31.3 g/dL (ref 30.0–36.0)
MCV: 110.3 fL — ABNORMAL HIGH (ref 80.0–100.0)
Monocytes Absolute: 0 10*3/uL — ABNORMAL LOW (ref 0.1–1.0)
Monocytes Relative: 1 %
Neutro Abs: 4.1 10*3/uL (ref 1.7–7.7)
Neutrophils Relative %: 83 %
Platelets: 170 10*3/uL (ref 150–400)
RBC: 2.52 MIL/uL — ABNORMAL LOW (ref 3.87–5.11)
RDW: 14.3 % (ref 11.5–15.5)
Smear Review: NORMAL
WBC: 4.9 10*3/uL (ref 4.0–10.5)
nRBC: 0 % (ref 0.0–0.2)

## 2021-08-27 LAB — BASIC METABOLIC PANEL
Anion gap: 14 (ref 5–15)
BUN: 59 mg/dL — ABNORMAL HIGH (ref 8–23)
CO2: 20 mmol/L — ABNORMAL LOW (ref 22–32)
Calcium: 12.4 mg/dL — ABNORMAL HIGH (ref 8.9–10.3)
Chloride: 106 mmol/L (ref 98–111)
Creatinine, Ser: 6.47 mg/dL — ABNORMAL HIGH (ref 0.44–1.00)
GFR, Estimated: 7 mL/min — ABNORMAL LOW (ref >60)
Glucose, Bld: 362 mg/dL — ABNORMAL HIGH (ref 70–99)
Potassium: 7.1 mmol/L (ref 3.5–5.1)
Sodium: 140 mmol/L (ref 135–145)

## 2021-08-27 LAB — RESP PANEL BY RT-PCR (FLU A&B, COVID) ARPGX2
Influenza A by PCR: NEGATIVE
Influenza B by PCR: NEGATIVE
SARS Coronavirus 2 by RT PCR: NEGATIVE

## 2021-08-27 LAB — HEPATIC FUNCTION PANEL
ALT: 125 U/L — ABNORMAL HIGH (ref 0–44)
AST: 270 U/L — ABNORMAL HIGH (ref 15–41)
Albumin: 3.1 g/dL — ABNORMAL LOW (ref 3.5–5.0)
Alkaline Phosphatase: 137 U/L — ABNORMAL HIGH (ref 38–126)
Bilirubin, Direct: 0.1 mg/dL (ref 0.0–0.2)
Indirect Bilirubin: 0.8 mg/dL (ref 0.3–0.9)
Total Bilirubin: 0.9 mg/dL (ref 0.3–1.2)
Total Protein: 5.8 g/dL — ABNORMAL LOW (ref 6.5–8.1)

## 2021-08-27 LAB — TROPONIN I (HIGH SENSITIVITY): Troponin I (High Sensitivity): 11 ng/L (ref <18)

## 2021-08-27 LAB — CBG MONITORING, ED: Glucose-Capillary: 424 mg/dL — ABNORMAL HIGH (ref 70–99)

## 2021-08-27 LAB — MAGNESIUM: Magnesium: 2.5 mg/dL — ABNORMAL HIGH (ref 1.7–2.4)

## 2021-08-27 MED ORDER — DOPAMINE-DEXTROSE 3.2-5 MG/ML-% IV SOLN
2.5000 ug/kg/min | INTRAVENOUS | Status: DC
  Administered 2021-08-27: 2.5 ug/kg/min via INTRAVENOUS
  Filled 2021-08-27: qty 250

## 2021-08-27 MED ORDER — SODIUM BICARBONATE 8.4 % IV SOLN
50.0000 meq | Freq: Once | INTRAVENOUS | Status: AC
  Administered 2021-08-27: 50 meq via INTRAVENOUS
  Filled 2021-08-27: qty 50

## 2021-08-27 MED ORDER — SODIUM ZIRCONIUM CYCLOSILICATE 5 G PO PACK
10.0000 g | PACK | Freq: Every day | ORAL | Status: DC
  Filled 2021-08-27 (×2): qty 1

## 2021-08-27 MED ORDER — CHLORHEXIDINE GLUCONATE CLOTH 2 % EX PADS
6.0000 | MEDICATED_PAD | Freq: Every day | CUTANEOUS | Status: DC
  Filled 2021-08-27: qty 6

## 2021-08-27 MED ORDER — HEPARIN SODIUM (PORCINE) 5000 UNIT/ML IJ SOLN
5000.0000 [IU] | Freq: Three times a day (TID) | INTRAMUSCULAR | Status: DC
  Administered 2021-08-28 – 2021-08-31 (×10): 5000 [IU] via SUBCUTANEOUS
  Filled 2021-08-27 (×10): qty 1

## 2021-08-27 MED ORDER — DOCUSATE SODIUM 100 MG PO CAPS: 100.0000 mg | ORAL_CAPSULE | Freq: Two times a day (BID) | ORAL | Status: DC | PRN

## 2021-08-27 MED ORDER — EPOETIN ALFA 4000 UNIT/ML IJ SOLN
4000.0000 [IU] | INTRAMUSCULAR | Status: DC
  Filled 2021-08-27: qty 1

## 2021-08-27 MED ORDER — INSULIN ASPART 100 UNIT/ML IJ SOLN
0.0000 [IU] | INTRAMUSCULAR | Status: DC
  Administered 2021-08-28: 3 [IU] via SUBCUTANEOUS
  Filled 2021-08-27: qty 1

## 2021-08-27 MED ORDER — POLYETHYLENE GLYCOL 3350 17 G PO PACK: 17.0000 g | PACK | Freq: Every day | ORAL | Status: DC | PRN

## 2021-08-27 MED ORDER — INSULIN ASPART 100 UNIT/ML IJ SOLN
INTRAMUSCULAR | Status: AC
  Filled 2021-08-27: qty 1

## 2021-08-27 MED ORDER — DOPAMINE-DEXTROSE 3.2-5 MG/ML-% IV SOLN: 2.5000 ug/kg/min | INTRAVENOUS | Status: DC

## 2021-08-27 MED ORDER — ATROPINE SULFATE 1 MG/10ML IJ SOSY: 1.0000 mg | PREFILLED_SYRINGE | Freq: Once | INTRAMUSCULAR | Status: DC

## 2021-08-27 MED ORDER — CALCIUM GLUCONATE 10 % IV SOLN
1.0000 g | Freq: Once | INTRAVENOUS | Status: AC
  Administered 2021-08-27: 1 g via INTRAVENOUS
  Filled 2021-08-27: qty 10

## 2021-08-27 MED ORDER — SODIUM ZIRCONIUM CYCLOSILICATE 10 G PO PACK
10.0000 g | PACK | Freq: Every day | ORAL | Status: DC
  Filled 2021-08-27: qty 1

## 2021-08-27 NOTE — H&P (Signed)
NAME:  Anita Contreras, MRN:  [id number], DOB:  1956/03/22, LOS: 0 ADMISSION DATE:  08/27/2021, CONSULTATION DATE: 08/27/2021 REFERRING MD: Dr. Jari Pigg, CHIEF COMPLAINT:   Loss of consciousness  History of Present Illness:  65 year old female with significant history of ESRD on HD, presented to [facility] ED on 08/27/2021 from home via EMS due to loss of consciousness.  Patient described, with Spanish interpreter assistance, being in her normal state of health until around 4 PM.  She reports feeling extremely fatigued, nauseous and then her vision " went black" and she vomited what she believes was nonbloody bilious emesis.  The patient had no knowledge of transporting to the hospital.  Patient and daughter confirmed that she did not fall to the ground with this syncopal episode.  Both confirmed her last dialysis session was on Saturday as per her normal Tuesday Thursday Saturday schedule.  Patient reports making herself tomato salsa with her morning egg today, otherwise no changes to her diet.  She denies chest pain, dyspnea, no current complaints of blurred vision/dizziness/headache/nausea. Per ED documentation EMS staff reported the patient was found unresponsive with heart rate in the 20s.  They started transcutaneously pacing, administered 2 mg of Versed, calcium and sodium bicarbonate in the field due to concerns for potential hyperkalemia.  ED course: Per ED documentation upon arrival patient was altered with low heart rates in the 20s to 30s.  She received an additional dose of calcium & sodium bicarbonate with the addition of 10 units of insulin.  CBG was in the 400s so D50 was held.  EDP paused TC pacing, but then had to restart within 2 to 3 minutes as the heart rate dropped back down into the 20s.  Cardiologist Dr. Fletcher Anon was consulted due to potential need for temporary pacing wires.  Dr. Fletcher Anon recommended initiation of dopamine drip and verifying potassium level prior to any cardiology  intervention.  After these interventions patient became more alert with heart rate in the 70s and normal blood pressure.  Dr. Candiss Norse was consulted, who recommended [facility] and emergent dialysis.  PCCM was consulted for admission.  Of note patient had similar presentation on 08/06/2021, with hyperkalemia greater than 7.5 and correlating severe bradycardia.  At the time of this admission which was also on a Sunday after her normal Saturday dialysis session. Initial vitals: Significant labs: (Labs/ Imaging personally reviewed) I, Domingo Pulse [place], AGACNP-BC, personally viewed and interpreted this ECG. EKG Interpretation Date: 08/28/2019 EKG Time: 2042 Rate: 52 Rhythm: Sinus bradycardia QRS Axis: Normal Intervals: RSR in V1 ST/T Wave abnormalities: T wave inversion in V1 and aVL, peaked T waves in inferior and precordial leads Narrative Interpretation: Sinus bradycardia with lateral T wave inversion and peaked T waves in inferior and precordial leads suggestive of hyperkalemia Na+: 140, K+:7.1, BUN/Cr.:  59/6.47, Serum CO2/ AG: 20/362 Hgb: 8.7, Troponin: 11, WBC: 4.9 CXR 08/27/2021: No acute abnormality noted  Pertinent  Medical History  ESRD on HD -T TH SA Hyperlipidemia Hypertension Stroke Type 2 diabetes mellitus Anemia Steal syndrome Diabetic retinopathy Cataracts Significant Hospital Events: Including procedures, antibiotic start and stop dates in addition to other pertinent events   08/27/2021: Patient admitted to ICU for emergent dialysis due to severe hyperkalemia causing severe bradycardia requiring dopamine and intermittent TC pacing support.  Interim History / Subjective:  Patient alert and responsive, interviewed with assistance of Spanish interpreter.   Patient reports "feeling much better".  No current complaints, however does appear weak with some chills.  Daughter bedside, all questions  and concerns answered at this time. Objective   Blood pressure (!) 140/58,  pulse 66, temperature 98.6 F (37 C), temperature source Axillary, resp. rate 14, height '5\' 2"'$  (1.575 m), weight 45.4 kg, last menstrual period 09/19/2001, SpO2 100 %.       No intake or output data in the 24 hours ending 08/27/21 2233 Filed Weights   08/27/21 2106  Weight: 45.4 kg    Examination: General: Adult female, critically ill, lying in bed-frail-appearing but NAD HEENT: MM pink/moist, anicteric, atraumatic, neck supple Neuro: A&O x 4, able to follow commands, PERRL L not reactive/R sluggish (baseline), MAE-generalized weakness CV: s1s2 RRR, NSR on monitor, no r/m/g Pulm: Regular, non labored on room air, breath sounds clear-BUL & diminished-BLL GI: soft, flat, non tender, bs x 4 Skin: no rashes/lesions noted Extremities: warm/dry, pulses + 2 R/P, no edema noted  Resolved Hospital Problem list     Assessment & Plan:  Severe hyperkalemia ESRD on HD Creatinine on admission: 6.47, K+ on admission 7.1 (after 2 rounds of calcium and bicarb as well as 10 units of insulin) -Continue Lokelma recommended by nephrology -Dr. Candiss Norse bedside organizing emergent dialysis -Strict I's and O's -Daily BMP, monitor electrolytes -Nephrology will continue to follow, appreciate input -Of note patient reports not taking most of prescribed outpatient medication including calcium acetate and Lasix  Severe bradycardia secondary to severe hyperkalemia PMHx: Hypertension, hyperlipidemia Patient intermittently transcutaneously paced due to heart rate in the 20s, low-dose dopamine drip initiated while shifting measures administered for hyperkalemia. Troponin: 11 - Emergent dialysis ordered as above, continue dopamine drip until dialysis is initiated - Maintain pacer pads for TC pacing as needed - Continuous cardiac monitoring -Outpatient medications on hold: Amlodipine and losartan, consider restarting as patient stabilizes.  Patient reports not taking PTA Lipitor prescription  Poorly controlled  Type 2 Diabetes Mellitus Glucose 424 on admission - Monitor CBG Q 4 hours - SSI moderate dosing - target range while in ICU: 140-180 - follow ICU hyper/hypo-glycemia protocol  Best Practice (right click and "Reselect all SmartList Selections" daily)  Diet/type: NPO w/ oral meds DVT prophylaxis: prophylactic heparin  GI prophylaxis: PPI Lines: N/A Foley:  N/A Code Status:  full code Last date of multidisciplinary goals of care discussion [08/27/21]  Labs   CBC: Recent Labs  Lab 08/27/21 2101  WBC 4.9  NEUTROABS 4.1  HGB 8.7*  HCT 27.8*  MCV 110.3*  PLT 123XX123    Basic Metabolic Panel: No results for input(s): NA, K, CL, CO2, GLUCOSE, BUN, CREATININE, CALCIUM, MG, PHOS in the last 168 hours. GFR: Estimated Creatinine Clearance: 11.3 mL/min (A) (by C-G formula based on SCr of 3.56 mg/dL (H)). Recent Labs  Lab 08/27/21 2101  WBC 4.9    Liver Function Tests: No results for input(s): AST, ALT, ALKPHOS, BILITOT, PROT, ALBUMIN in the last 168 hours. No results for input(s): LIPASE, AMYLASE in the last 168 hours. No results for input(s): AMMONIA in the last 168 hours.  ABG    Component Value Date/Time   PHART 7.26 (L) 01/14/2017 0813   PCO2ART 52 (H) 01/14/2017 0813   PO2ART 71 (L) 01/14/2017 0813   HCO3 36.6 (H) 02/10/2021 1903   ACIDBASEDEF 4.1 (H) 01/14/2017 0813   O2SAT 99.8 02/10/2021 1903     Coagulation Profile: No results for input(s): INR, PROTIME in the last 168 hours.  Cardiac Enzymes: No results for input(s): CKTOTAL, CKMB, CKMBINDEX, TROPONINI in the last 168 hours.  HbA1C: Hgb A1c MFr Bld  Date/Time Value  Ref Range Status  08/07/2021 06:07 AM 6.0 (H) 4.8 - 5.6 % Final    Comment:    (NOTE)         Prediabetes: 5.7 - 6.4         Diabetes: >6.4         Glycemic control for adults with diabetes: <7.0   08/08/2020 04:58 AM 6.4 (H) 4.8 - 5.6 % Final    Comment:    (NOTE) Pre diabetes:          5.7%-6.4%  Diabetes:              >6.4%  Glycemic  control for   <7.0% adults with diabetes     CBG: No results for input(s): GLUCAP in the last 168 hours.  Review of Systems: Positives in BOLD  Gen: Denies fever, chills, weight change, fatigue, night sweats HEENT: Denies blurred vision, double vision, hearing loss, tinnitus, sinus congestion, rhinorrhea, sore throat, neck stiffness, dysphagia PULM: Denies shortness of breath, cough, sputum production, hemoptysis, wheezing CV: Denies chest pain, edema, orthopnea, paroxysmal nocturnal dyspnea, palpitations GI: Denies abdominal pain, nausea, vomiting, diarrhea, hematochezia, melena, constipation, change in bowel habits GU: Denies dysuria, hematuria, polyuria, oliguria, urethral discharge Endocrine: Denies hot or cold intolerance, polyuria, polyphagia or appetite change Derm: Denies rash, dry skin, scaling or peeling skin change Heme: Denies easy bruising, bleeding, bleeding gums Neuro: Denies headache, numbness, weakness, slurred speech, loss of memory or consciousness  Past Medical History:  She,  has a past medical history of Acute cystitis with hematuria, Anemia, Cataracts, bilateral, CKD (chronic kidney disease), Diabetic retinopathy, background ([place]), Dialysis patient ([place]), Dizziness, Hyperlipidemia, Hypertension, Migraine variant, Neuropathy, OP (osteoporosis), Personal history of noncompliance with medical treatment, presenting hazards to health, Steal syndrome as complication of dialysis access [facility]), Stroke ([place]), Type II diabetes mellitus with ophthalmic manifestations, uncontrolled ([place]), and Vitamin D deficiency.   Surgical History:   Past Surgical History:  Procedure Laterality Date   A/V FISTULAGRAM Right 06/03/2018   Procedure: A/V FISTULAGRAM;  Surgeon: Algernon Huxley, MD;  Location: [place] [facility];  Service: Cardiovascular;  Laterality: Right;   A/V FISTULAGRAM Right 02/22/2019   Procedure: A/V FISTULAGRAM;  Surgeon: Algernon Huxley, MD;  Location: [place] [facility];   Service: Cardiovascular;  Laterality: Right;   A/V FISTULAGRAM Right 08/09/2021   Procedure: A/V FISTULAGRAM;  Surgeon: Algernon Huxley, MD;  Location: [place] [facility];  Service: Cardiovascular;  Laterality: Right;   A/V SHUNT INTERVENTION N/A 01/26/2018   Procedure: A/V SHUNT INTERVENTION;  Surgeon: Algernon Huxley, MD;  Location: [place] [facility];  Service: Cardiovascular;  Laterality: N/A;   A/V SHUNTOGRAM Right 01/26/2018   Procedure: A/V SHUNTOGRAM;  Surgeon: Algernon Huxley, MD;  Location: [place] [facility];  Service: Cardiovascular;  Laterality: Right;   AV FISTULA PLACEMENT Right 10/10/2016   Procedure: ARTERIOVENOUS (AV) FISTULA CREATION ( BRACHIOCEPHALIC );  Surgeon: Algernon Huxley, MD;  Location: [facility];  Service: Vascular;  Laterality: Right;   DILATION AND CURETTAGE OF UTERUS     DISTAL REVASCULARIZATION AND INTERVAL LIGATION (DRIL) Right 11/08/2016   Procedure: DISTAL REVASCULARIZATION AND INTERVAL LIGATION (DRIL);  Surgeon: Algernon Huxley, MD;  Location: [facility];  Service: Vascular;  Laterality: Right;   PERIPHERAL VASCULAR CATHETERIZATION N/A 04/25/2016   Procedure: Dialysis/Perma Catheter Insertion;  Surgeon: Algernon Huxley, MD;  Location: [place] [facility];  Service: Cardiovascular;  Laterality: N/A;   PERIPHERAL VASCULAR CATHETERIZATION Right 10/17/2016  Procedure: Upper Extremity Angiography;  Surgeon: Algernon Huxley, MD;  Location: [place] [facility];  Service: Cardiovascular;  Laterality: Right;   UPPER EXTREMITY ANGIOGRAPHY Right 10/12/2018   Procedure: UPPER EXTREMITY ANGIOGRAPHY;  Surgeon: Algernon Huxley, MD;  Location: [place] [facility];  Service: Cardiovascular;  Laterality: Right;     Social History:   reports that she has never smoked. She has never used smokeless tobacco. She reports that she does not drink alcohol and does not use drugs.   Family History:  Her family history includes Hyperlipidemia in her mother; Hypertension in her mother.   Allergies Allergies   Allergen Reactions   Dextromethorphan-Guaifenesin     Other reaction(s): Itching of skin (finding)   Propofol Itching    Pt says " when getting anesthesia by body itches" unsure what type of anesthesia     Home Medications  Prior to Admission medications   Medication Sig Start Date End Date Taking? Authorizing Provider  acetaminophen (TYLENOL) 500 MG tablet Take 1,000 mg by mouth every 6 (six) hours as needed for mild pain or headache.    [provider]  amLODipine (NORVASC) 10 MG tablet Take 1 tablet (10 mg total) by mouth daily. 01/27/18   Bettey Costa, MD  aspirin 81 MG EC tablet TAKE 1 TABLET BY MOUTH EVERY DAY Patient not taking: Reported on 08/06/2021 04/15/17   Stegmayer, Janalyn Harder, PA-C  atorvastatin (LIPITOR) 40 MG tablet Take 40 mg by mouth daily at 6 PM.  Patient not taking: Reported on 08/06/2021    [provider]  calcium acetate (PHOSLO) 667 MG capsule Take 1 capsule (667 mg total) by mouth 3 (three) times daily with meals. Patient not taking: Reported on 08/06/2021 04/19/16   Fritzi Mandes, MD  furosemide (LASIX) 20 MG tablet Take 20 mg by mouth daily. Patient not taking: Reported on 08/06/2021    [provider]  hydrOXYzine (ATARAX/VISTARIL) 50 MG tablet Take 25 mg by mouth 2 (two) times daily as needed for itching.    [provider]  insulin NPH-regular Human (70-30) 100 UNIT/ML injection Inject 3 Units into the skin 2 (two) times daily with a meal.    [provider]  lidocaine-prilocaine (EMLA) cream Apply 1 application topically as needed. Patient not taking: Reported on 08/06/2021    [provider]  losartan (COZAAR) 50 MG tablet Take 1 tablet (50 mg total) by mouth daily. 01/27/18   Bettey Costa, MD  pantoprazole (PROTONIX) 40 MG tablet Take 1 tablet (40 mg total) by mouth daily. Patient not taking: Reported on 08/06/2021 08/12/20   Loletha Grayer, MD     Critical care time: 52 minutes      Venetia Night, AGACNP-BC Acute Care Nurse Practitioner [facility]   [phone] / [phone] Please see Amion for pager details.

## 2021-08-27 NOTE — ED Provider Notes (Addendum)
[facility] Emergency Department Provider Note  ____________________________________________   Event Date/Time   First MD Initiated Contact with Patient 08/27/21 2054     (approximate)  I have reviewed the triage vital signs and the nursing notes.   HISTORY  Chief Complaint Loss of Consciousness    HPI Anita Contreras is a 65 y.o. female with ESRD who comes in with loss of consciousness.  Patient was found unresponsive with heart rates in the 20s was paced with EMS given 2 mg of Versed.  I did receive a phone call from EMS and recommended they give some calcium as well as some bicarb due to concern for potential hyperkalemia.  Patient was transferred here with pacing  Unable to get full HPI from patient due to altered mental status     Past Medical History:  Diagnosis Date   Acute cystitis with hematuria    Anemia    Cataracts, bilateral    CKD (chronic kidney disease)    Diabetic retinopathy, background ([place])    Dialysis patient ([place])    Tues, Thurs, Sat   Dizziness    Hyperlipidemia    Hypertension    Migraine variant    Neuropathy    OP (osteoporosis)    Personal history of noncompliance with medical treatment, presenting hazards to health    Steal syndrome as complication of dialysis access [facility])    Stroke ([place])    Type II diabetes mellitus with ophthalmic manifestations, uncontrolled ([place])    Vitamin D deficiency     Patient Active Problem List   Diagnosis Date Noted   Acute respiratory failure with hypoxia ([place]) 02/10/2021   GERD (gastroesophageal reflux disease) 02/10/2021   Respiratory failure with hypoxia ([place]) 02/10/2021   Weakness    Acute respiratory failure due to COVID-19 ([place]) 08/07/2020   Type II diabetes mellitus with ophthalmic manifestations, uncontrolled ([place])    Abnormality of albumin 01/20/2020   Mild protein-calorie malnutrition ([place]) 02/28/2019   Dependence on renal dialysis ([place]) 02/05/2018   HTN  (hypertension) 01/26/2018   Headache, unspecified 01/22/2018   HCAP (healthcare-associated pneumonia) 12/23/2017   Fever, unspecified 12/23/2017   Sinus bradycardia 01/17/2017   Syncope 01/17/2017   Nausea and vomiting 01/17/2017   Lactic acidosis 01/17/2017   Leukocytosis 01/17/2017   Hyperkalemia 01/14/2017   Other chronic pain 12/14/2016   Steal syndrome as complication of dialysis access ([place]) 11/08/2016   Pain in left arm 10/23/2016   Renal dialysis device, implant, or graft complication [id number]   Hyperlipidemia 10/15/2016   Essential hypertension 10/15/2016   Type 2 diabetes mellitus with ESRD (end-stage renal disease) ([place]) 10/15/2016   Disorder of phosphorus metabolism, unspecified 07/01/2016   Encounter for immunization 06/05/2016   Other disorders of plasma-protein metabolism, not elsewhere classified 06/01/2016   Anemia in chronic kidney disease 05/12/2016   Iron deficiency anemia, unspecified 05/12/2016   End stage renal disease ([place]) 05/06/2016   Coagulation defect, unspecified ([place]) 05/06/2016   Heart failure, unspecified ([place]) 05/06/2016   Shortness of breath 05/06/2016   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 ([place])    Acute right flank pain    Acute cystitis 05/26/2015   Neuropathy 05/26/2015    Past Surgical History:  Procedure Laterality Date   A/V FISTULAGRAM Right 06/03/2018   Procedure: A/V FISTULAGRAM;  Surgeon: Algernon Huxley, MD;  Location: [facility];  Service: Cardiovascular;  Laterality: Right;   A/V FISTULAGRAM Right 02/22/2019   Procedure: A/V FISTULAGRAM;  Surgeon: Algernon Huxley,  MD;  Location: [facility];  Service: Cardiovascular;  Laterality: Right;   A/V FISTULAGRAM Right 08/09/2021   Procedure: A/V FISTULAGRAM;  Surgeon: Algernon Huxley, MD;  Location: [facility];  Service: Cardiovascular;  Laterality: Right;   A/V SHUNT INTERVENTION N/A 01/26/2018   Procedure: A/V SHUNT INTERVENTION;  Surgeon:  Algernon Huxley, MD;  Location: [facility];  Service: Cardiovascular;  Laterality: N/A;   A/V SHUNTOGRAM Right 01/26/2018   Procedure: A/V SHUNTOGRAM;  Surgeon: Algernon Huxley, MD;  Location: [facility];  Service: Cardiovascular;  Laterality: Right;   AV FISTULA PLACEMENT Right 10/10/2016   Procedure: ARTERIOVENOUS (AV) FISTULA CREATION ( BRACHIOCEPHALIC );  Surgeon: Algernon Huxley, MD;  Location: [facility];  Service: Vascular;  Laterality: Right;   DILATION AND CURETTAGE OF UTERUS     DISTAL REVASCULARIZATION AND INTERVAL LIGATION (DRIL) Right 11/08/2016   Procedure: DISTAL REVASCULARIZATION AND INTERVAL LIGATION (DRIL);  Surgeon: Algernon Huxley, MD;  Location: [facility];  Service: Vascular;  Laterality: Right;   PERIPHERAL VASCULAR CATHETERIZATION N/A 04/25/2016   Procedure: Dialysis/Perma Catheter Insertion;  Surgeon: Algernon Huxley, MD;  Location: [facility];  Service: Cardiovascular;  Laterality: N/A;   PERIPHERAL VASCULAR CATHETERIZATION Right 10/17/2016   Procedure: Upper Extremity Angiography;  Surgeon: Algernon Huxley, MD;  Location: [facility];  Service: Cardiovascular;  Laterality: Right;   UPPER EXTREMITY ANGIOGRAPHY Right 10/12/2018   Procedure: UPPER EXTREMITY ANGIOGRAPHY;  Surgeon: Algernon Huxley, MD;  Location: [facility];  Service: Cardiovascular;  Laterality: Right;    Prior to Admission medications   Medication Sig Start Date End Date Taking? Authorizing Provider  acetaminophen (TYLENOL) 500 MG tablet Take 1,000 mg by mouth every 6 (six) hours as needed for mild pain or headache.    [provider]  amLODipine (NORVASC) 10 MG tablet Take 1 tablet (10 mg total) by mouth daily. 01/27/18   Bettey Costa, MD  aspirin 81 MG EC tablet TAKE 1 TABLET BY MOUTH EVERY DAY Patient not taking: Reported on 08/06/2021 04/15/17   Stegmayer, Janalyn Harder, PA-C  atorvastatin (LIPITOR) 40 MG tablet Take 40 mg by mouth daily at 6 PM.  Patient not taking: Reported on  08/06/2021    [provider]  calcium acetate (PHOSLO) 667 MG capsule Take 1 capsule (667 mg total) by mouth 3 (three) times daily with meals. Patient not taking: Reported on 08/06/2021 04/19/16   Fritzi Mandes, MD  furosemide (LASIX) 20 MG tablet Take 20 mg by mouth daily. Patient not taking: Reported on 08/06/2021    [provider]  hydrOXYzine (ATARAX/VISTARIL) 50 MG tablet Take 25 mg by mouth 2 (two) times daily as needed for itching.    [provider]  insulin NPH-regular Human (70-30) 100 UNIT/ML injection Inject 3 Units into the skin 2 (two) times daily with a meal.    [provider]  lidocaine-prilocaine (EMLA) cream Apply 1 application topically as needed. Patient not taking: Reported on 08/06/2021    [provider]  losartan (COZAAR) 50 MG tablet Take 1 tablet (50 mg total) by mouth daily. 01/27/18   Bettey Costa, MD  pantoprazole (PROTONIX) 40 MG tablet Take 1 tablet (40 mg total) by mouth daily. Patient not taking: Reported on 08/06/2021 08/12/20   Loletha Grayer, MD    Allergies Dextromethorphan-guaifenesin and Propofol  Family History  Problem Relation Age of Onset   Hypertension Mother    Hyperlipidemia Mother  Social History Social History   Tobacco Use   Smoking status: Never   Smokeless tobacco: Never  Substance Use Topics   Alcohol use: No   Drug use: No      Review of Systems Unable to get full review of systems due to altered mental status _____________________   PHYSICAL EXAM:  VITAL SIGNS: ED Triage Vitals  Enc Vitals Group     BP      Pulse      Resp      Temp      Temp src      SpO2      Weight      Height      Head Circumference      Peak Flow      Pain Score      Pain Loc      Pain Edu?      Excl. in [place]?     Constitutional: Altered but does wake up Eyes: Conjunctivae are normal. EOMI. Head: Atraumatic. Nose: No congestion/rhinnorhea. Mouth/Throat: Mucous membranes are moist.   Neck:  No stridor. Trachea Midline. FROM Cardiovascular: Being paced with good pulses palpated. respiratory: Normal respiratory effort.  No retractions. Lungs CTAB. Gastrointestinal: Soft and nontender. No distention. No abdominal bruits.  Musculoskeletal: No lower extremity tenderness nor edema.  No joint effusions.  Right arm fistula Neurologic: Unable to fully assess due to some altered mental status.  Patient does withdrawal to pain and does move all extremities when calcium is being injected into her IV. Skin:  Skin is warm, dry and intact. No rash noted. Psychiatric: Unable to assess due to altered mental status. GU: Deferred   ____________________________________________   LABS (all labs ordered are listed, but only abnormal results are displayed)  Labs Reviewed  CBC WITH DIFFERENTIAL/PLATELET - Abnormal; Notable for the following components:      Result Value   RBC 2.52 (*)    Hemoglobin 8.7 (*)    HCT 27.8 (*)    MCV 110.3 (*)    MCH 34.5 (*)    All other components within normal limits  RESP PANEL BY RT-PCR (FLU A&B, COVID) ARPGX2  BASIC METABOLIC PANEL  HEPATIC FUNCTION PANEL  MAGNESIUM  TROPONIN I (HIGH SENSITIVITY)   ____________________________________________   ED ECG REPORT I, Vanessa [place], the attending physician, personally viewed and interpreted this ECG.  Sinus rate of 67, no ST elevation, T wave inversion in aVL, left bundle branch block  Repeat EKG is sinus rate of 80, no ST elevation, no T wave inversions except for aVL, normal intervals ____________________________________________  RADIOLOGY Robert Bellow, personally viewed and evaluated these images (plain radiographs) as part of my medical decision making, as well as reviewing the written report by the radiologist.  ED MD interpretation: No pneumonia  Official radiology report(s): DG Chest Portable 1 View  Result Date: 08/27/2021 CLINICAL DATA:  Bradycardia and unresponsiveness EXAM: PORTABLE  CHEST 1 VIEW COMPARISON:  02/10/2021 FINDINGS: Cardiac shadow is enlarged but stable. Lungs are well aerated bilaterally. No focal infiltrate or sizable effusion is seen. Stenting is noted in the right shoulder stable from the prior exam. No sizable effusion is noted. No bony abnormality is seen. IMPRESSION: No acute abnormality noted. Electronically Signed   By: Inez Catalina M.D.   On: 08/27/2021 21:16    ____________________________________________   PROCEDURES  Procedure(s) performed (including Critical Care):  .1-3 Lead EKG Interpretation Performed by: Vanessa Krum, MD Authorized by: Vanessa , MD  Interpretation: normal     ECG rate:  50s   ECG rate assessment: normal     Rhythm: sinus bradycardia     Ectopy: none     Conduction: normal   .Critical Care Performed by: Vanessa [place], MD Authorized by: Vanessa [place], MD   Critical care provider statement:    Critical care time (minutes):  45   Critical care was necessary to treat or prevent imminent or life-threatening deterioration of the following conditions:  Cardiac failure   Critical care was time spent personally by me on the following activities:  Discussions with consultants, evaluation of patient's response to treatment, examination of patient, ordering and performing treatments and interventions, ordering and review of laboratory studies, ordering and review of radiographic studies, pulse oximetry, re-evaluation of patient's condition, obtaining history from patient or surrogate and review of old charts   ____________________________________________   INITIAL IMPRESSION / ASSESSMENT AND PLAN / ED COURSE  Anita Contreras was evaluated in Emergency Department on 08/27/2021 for the symptoms described in the history of present illness. She was evaluated in the context of the global COVID-19 pandemic, which necessitated consideration that the patient might be at risk for infection with the SARS-CoV-2 virus  that causes COVID-19. Institutional protocols and algorithms that pertain to the evaluation of patients at risk for COVID-19 are in a state of rapid change based on information released by regulatory bodies including the CDC and federal and state organizations. These policies and algorithms were followed during the patient's care in the ED.    Patient comes in with altered mental status with low heart rates.  Patient was given additional dose of calcium, bicarb, insulin given that sugars were in the 400s to treat hyperkalemia.  Patient pacing was paused and patient will had heart rates in the 60s that was sinus but then after about 2 or 3 minutes went back down into the 20s so pacing was reinitiated.  I did discuss with the cardiologist Dr. Fletcher Anon  for possible temp pacing wire who recommended that we check the potassium and get labs back first and see if he can be corrected with the dopamine drip.  I did add this on.  On review of records patient had multiple admissions secondary to bradycardia in the 30s and junctional rhythms treated with atropine  I did call the daughter to make sure there is no evidence of trauma and it sounds like there is been no falls or anything to suggest intracranial hemorrhage.  According to the daughter she was Sitting on the couch and she was saying she didn't feel good and felt pressure was low in the 50s per daughter. Pt went unresponsive in front of her the daughter. Eye rolled up and frozen. No recent falls.  She did not fall to the ground.  Tues/thurs/sat dialysis. Went to dialysis on Saturday.   10:24 PM patient is more alert and is talking to the daughter and is moving all extremities.  No evidence of trauma on examination neuro intact at this time.  Low suspicion for intracranial hemorrhage.  Patient was given other dose of calcium, bicarb and heart rates went up into the 80s.  Pacing monitor left on patient and so if her heart rates go above is low 60 that it will  automatically start pacing her. Dr. Candiss Norse from nephrology is recommending some Lokelma and to work on dialyzing patient.  Discussed the ICU team for admission.        ____________________________________________  FINAL CLINICAL IMPRESSION(S) / ED DIAGNOSES   Final diagnoses:  Hyperkalemia  Bradycardia  ESRD (end stage renal disease) (HCC)  Altered mental status, unspecified altered mental status type      MEDICATIONS GIVEN DURING THIS VISIT:  Medications  DOPamine (INTROPIN) 800 mg in dextrose 5 % 250 mL (3.2 mg/mL) infusion (2.5 mcg/kg/min  45 kg (Order-Specific) Intravenous New Bag/Given 08/27/21 2114)  sodium zirconium cyclosilicate (LOKELMA) packet 10 g (has no administration in time range)  docusate sodium (COLACE) capsule 100 mg (has no administration in time range)  polyethylene glycol (MIRALAX / GLYCOLAX) packet 17 g (has no administration in time range)  heparin injection 5,000 Units (has no administration in time range)  calcium gluconate inj 10% (1 g) URGENT USE ONLY! (1 g Intravenous Given 08/27/21 2159)  sodium bicarbonate injection 50 mEq (50 mEq Intravenous Given 08/27/21 2200)     ED Discharge Orders     None        Note:  This document was prepared using Dragon voice recognition software and may include unintentional dictation errors.    Vanessa [place], MD 08/27/21 2252    Vanessa [place], MD 08/27/21 [phone]

## 2021-08-27 NOTE — ED Notes (Signed)
Pt given insulin 10 units IV

## 2021-08-27 NOTE — Progress Notes (Signed)
[facility], [place] 08/27/21  Subjective:   LOS: 0  Anita Contreras is a 65 y.o. [place] speaking female known to [place] from previous admissions This time she presents for cyanosis and unresponsiveness. Last dialysis treatment was on Saturday In the ER noted to be bradycardic with HR in 20's Given shifting measures in the ER- calcium gluconate, sodium bicarbonate, lokelma Required cardiac pacing   Objective:  Vital signs in last 24 hours:  Temp:  [98.6 F (37 C)] 98.6 F (37 C) (09/19 2131) Pulse Rate:  [46-66] 66 (09/19 2130) Resp:  [14-16] 14 (09/19 2130) BP: (120-140)/(51-58) 140/58 (09/19 2130) SpO2:  [100 %] 100 % (09/19 2130) Weight:  [45.4 kg] 45.4 kg (09/19 2106)  Weight change:  Filed Weights   08/27/21 2106  Weight: 45.4 kg    Intake/Output:   No intake or output data in the 24 hours ending 08/27/21 2231   Physical Exam: General:  Ill appesring, laying on stretcher  HEENT  anicteric, moist oral mucous membrane  Pulm/lungs  normal breathing effort, lungs are clear to auscultation  CVS/Heart  regular rhythm, no rub or gallop  Abdomen:   Soft, nontender  Extremities:  No peripheral edema  Neurologic: lethargic  Skin:  No acute rashes  Right arm AV fistula, + thrill  Basic Metabolic Panel:  No results for input(s): NA, K, CL, CO2, GLUCOSE, BUN, CREATININE, CALCIUM, MG, PHOS in the last 168 hours.    CBC: Recent Labs  Lab 08/27/21 2101  WBC 4.9  NEUTROABS 4.1  HGB 8.7*  HCT 27.8*  MCV 110.3*  PLT 170       Lab Results  Component Value Date   HEPBSAG NON REACTIVE 08/06/2021   HEPBSAB Reactive (A) 08/06/2021   HEPBIGM Negative 04/16/2016      Microbiology:  Recent Results (from the past 240 hour(s))  Resp Panel by RT-PCR (Flu A&B, Covid) Nasopharyngeal Swab     Status: None   Collection Time: 08/27/21  9:11 PM   Specimen: Nasopharyngeal Swab; Nasopharyngeal(NP) swabs in vial transport medium   Result Value Ref Range Status   SARS Coronavirus 2 by RT PCR NEGATIVE NEGATIVE Final    Comment: (NOTE) SARS-CoV-2 target nucleic acids are NOT DETECTED.  The SARS-CoV-2 RNA is generally detectable in upper respiratory specimens during the acute phase of infection. The lowest concentration of SARS-CoV-2 viral copies this assay can detect is 138 copies/mL. A negative result does not preclude SARS-Cov-2 infection and should not be used as the sole basis for treatment or other patient management decisions. A negative result may occur with  improper specimen collection/handling, submission of specimen other than nasopharyngeal swab, presence of viral mutation(s) within the areas targeted by this assay, and inadequate number of viral copies(<138 copies/mL). A negative result must be combined with clinical observations, patient history, and epidemiological information. The expected result is Negative.  Fact Sheet for Patients:  EntrepreneurPulse.com.au  Fact Sheet for Healthcare Providers:  IncredibleEmployment.be  This test is no t yet approved or cleared by the [place] FDA and  has been authorized for detection and/or diagnosis of SARS-CoV-2 by FDA under an Emergency Use Authorization (EUA). This EUA will remain  in effect (meaning this test can be used) for the duration of the COVID-19 declaration under Section 564(b)(1) of the Act, 21 U.S.C.section 360bbb-3(b)(1), unless the authorization is terminated  or revoked sooner.       Influenza A by PCR NEGATIVE NEGATIVE Final   Influenza B by PCR NEGATIVE  NEGATIVE Final    Comment: (NOTE) The Xpert Xpress SARS-CoV-2/FLU/RSV plus assay is intended as an aid in the diagnosis of influenza from Nasopharyngeal swab specimens and should not be used as a sole basis for treatment. Nasal washings and aspirates are unacceptable for Xpert Xpress SARS-CoV-2/FLU/RSV testing.  Fact Sheet for  Patients: EntrepreneurPulse.com.au  Fact Sheet for Healthcare Providers: IncredibleEmployment.be  This test is not yet approved or cleared by the [place] FDA and has been authorized for detection and/or diagnosis of SARS-CoV-2 by FDA under an Emergency Use Authorization (EUA). This EUA will remain in effect (meaning this test can be used) for the duration of the COVID-19 declaration under Section 564(b)(1) of the Act, 21 U.S.C. section 360bbb-3(b)(1), unless the authorization is terminated or revoked.  Performed at [facility], [address] [place], [place] [address]     Coagulation Studies: No results for input(s): LABPROT, INR in the last 72 hours.   Urinalysis: No results for input(s): COLORURINE, LABSPEC, PHURINE, GLUCOSEU, HGBUR, BILIRUBINUR, KETONESUR, PROTEINUR, UROBILINOGEN, NITRITE, LEUKOCYTESUR in the last 72 hours.  Invalid input(s): APPERANCEUR    Imaging: DG Chest Portable 1 View  Result Date: 08/27/2021 CLINICAL DATA:  Bradycardia and unresponsiveness EXAM: PORTABLE CHEST 1 VIEW COMPARISON:  02/10/2021 FINDINGS: Cardiac shadow is enlarged but stable. Lungs are well aerated bilaterally. No focal infiltrate or sizable effusion is seen. Stenting is noted in the right shoulder stable from the prior exam. No sizable effusion is noted. No bony abnormality is seen. IMPRESSION: No acute abnormality noted. Electronically Signed   By: Inez Catalina M.D.   On: 08/27/2021 21:16     Medications:    DOPamine 2.5 mcg/kg/min (08/27/21 2114)    atropine  1 mg Intravenous Once   [START ON 08/28/2021] sodium zirconium cyclosilicate  10 g Oral Daily   REM  Assessment/ Plan:  65 y.o. female with end-stage renal disease (4 to 5 years), diabetes type 2, hyperlipidemia, history of stroke, diabetic retinopathy was admitted for  Loss of Consciousness Hyperkalemia  [facility] nephrology/TTS/[place]/right arm AV fistula  #.   Hyperkalemia with end stage renal disease on dialysis Lab Results  Component Value Date   K 7.1 ([place]) 08/27/2021    Last dialysis treatment on Saturday.  Multiple recent admissions for hyperkalemia Had angioplasty of access on 9/1 Daughter at bedside reports that patient did not miss her treatment Now with hyperkalemia causing bradycardia and peaked T waves Consent for emergent HD obtained from the daughter  # Secondary hyperparathyroidism of renal origin N 25.81  Monitor calcium and  phos during admission Lab Results  Component Value Date   PTH 183 (H) 11/09/2016   CALCIUM 12.4 (H) 08/27/2021   PHOS 3.4 08/07/2021    # anemia of CKD  Lab Results  Component Value Date   HGB 8.7 (L) 08/27/2021    Low dose EPO with HD  Case d/w with ER team, ICU team, dialysis nurse   LOS: 0 Anita Contreras 9/19/202210:31 PM Critical care about 50 min

## 2021-08-27 NOTE — ED Triage Notes (Signed)
Pt brought in by ems for c/o pt found by family to be unresponsive and cyanotic; ems arrived and found HR to be 29 with BP in 60's; pt given '2mg'$  of versed and paced in the field; pt arrived to ED and pt placed on zoll pads with mA at 30 and pace at 70;   Pt given an amp of calcium at 2035, atropine at 2048

## 2021-08-27 NOTE — ED Notes (Signed)
Patient arrived in error; disregarding charting; IT and registration notified

## 2021-08-28 ENCOUNTER — Inpatient Hospital Stay: Payer: Medicaid Other

## 2021-08-28 DIAGNOSIS — E875 Hyperkalemia: Principal | ICD-10-CM

## 2021-08-28 DIAGNOSIS — R4182 Altered mental status, unspecified: Secondary | ICD-10-CM

## 2021-08-28 LAB — GLUCOSE, CAPILLARY
Glucose-Capillary: 119 mg/dL — ABNORMAL HIGH (ref 70–99)
Glucose-Capillary: 126 mg/dL — ABNORMAL HIGH (ref 70–99)
Glucose-Capillary: 139 mg/dL — ABNORMAL HIGH (ref 70–99)
Glucose-Capillary: 186 mg/dL — ABNORMAL HIGH (ref 70–99)
Glucose-Capillary: 46 mg/dL — ABNORMAL LOW (ref 70–99)
Glucose-Capillary: 81 mg/dL (ref 70–99)
Glucose-Capillary: 83 mg/dL (ref 70–99)
Glucose-Capillary: 92 mg/dL (ref 70–99)

## 2021-08-28 LAB — BASIC METABOLIC PANEL
Anion gap: 9 (ref 5–15)
BUN: 66 mg/dL — ABNORMAL HIGH (ref 8–23)
CO2: 29 mmol/L (ref 22–32)
Calcium: 10.2 mg/dL (ref 8.9–10.3)
Chloride: 103 mmol/L (ref 98–111)
Creatinine, Ser: 6.89 mg/dL — ABNORMAL HIGH (ref 0.44–1.00)
GFR, Estimated: 6 mL/min — ABNORMAL LOW (ref >60)
Glucose, Bld: 171 mg/dL — ABNORMAL HIGH (ref 70–99)
Potassium: >7.5 mmol/L (ref 3.5–5.1)
Sodium: 141 mmol/L (ref 135–145)

## 2021-08-28 LAB — POTASSIUM
Potassium: 4.2 mmol/L (ref 3.5–5.1)
Potassium: 2.7 mmol/L — CL (ref 3.5–5.1)

## 2021-08-28 LAB — PHOSPHORUS: Phosphorus: 4.4 mg/dL (ref 2.5–4.6)

## 2021-08-28 LAB — BASIC METABOLIC PANEL WITH GFR
Anion gap: 11 (ref 5–15)
BUN: 14 mg/dL (ref 8–23)
CO2: 30 mmol/L (ref 22–32)
Calcium: 8.9 mg/dL (ref 8.9–10.3)
Chloride: 96 mmol/L — ABNORMAL LOW (ref 98–111)
Creatinine, Ser: 2.32 mg/dL — ABNORMAL HIGH (ref 0.44–1.00)
GFR, Estimated: 23 mL/min — ABNORMAL LOW
Glucose, Bld: 82 mg/dL (ref 70–99)
Potassium: 4 mmol/L (ref 3.5–5.1)
Sodium: 137 mmol/L (ref 135–145)

## 2021-08-28 LAB — CBC
HCT: 26.5 % — ABNORMAL LOW (ref 36.0–46.0)
Hemoglobin: 8.7 g/dL — ABNORMAL LOW (ref 12.0–15.0)
MCH: 34.1 pg — ABNORMAL HIGH (ref 26.0–34.0)
MCHC: 32.8 g/dL (ref 30.0–36.0)
MCV: 103.9 fL — ABNORMAL HIGH (ref 80.0–100.0)
Platelets: 176 10*3/uL (ref 150–400)
RBC: 2.55 MIL/uL — ABNORMAL LOW (ref 3.87–5.11)
RDW: 14.1 % (ref 11.5–15.5)
WBC: 14.1 10*3/uL — ABNORMAL HIGH (ref 4.0–10.5)
nRBC: 0 % (ref 0.0–0.2)

## 2021-08-28 LAB — HIV ANTIBODY (ROUTINE TESTING W REFLEX): HIV Screen 4th Generation wRfx: NONREACTIVE

## 2021-08-28 LAB — TROPONIN I (HIGH SENSITIVITY): Troponin I (High Sensitivity): 24 ng/L — ABNORMAL HIGH

## 2021-08-28 LAB — MRSA NEXT GEN BY PCR, NASAL: MRSA by PCR Next Gen: NOT DETECTED

## 2021-08-28 LAB — MAGNESIUM: Magnesium: 2.8 mg/dL — ABNORMAL HIGH (ref 1.7–2.4)

## 2021-08-28 MED ORDER — PANTOPRAZOLE SODIUM 40 MG IV SOLR
40.0000 mg | INTRAVENOUS | Status: DC
  Administered 2021-08-28: 40 mg via INTRAVENOUS
  Filled 2021-08-28: qty 40

## 2021-08-28 MED ORDER — HYDRALAZINE HCL 50 MG PO TABS
50.0000 mg | ORAL_TABLET | Freq: Three times a day (TID) | ORAL | Status: DC
  Administered 2021-08-28 – 2021-08-31 (×9): 50 mg via ORAL
  Filled 2021-08-28 (×9): qty 1

## 2021-08-28 MED ORDER — CHLORHEXIDINE GLUCONATE CLOTH 2 % EX PADS: 6.0000 | MEDICATED_PAD | Freq: Every day | CUTANEOUS | Status: DC

## 2021-08-28 MED ORDER — AMLODIPINE BESYLATE 10 MG PO TABS
10.0000 mg | ORAL_TABLET | Freq: Every day | ORAL | Status: DC
  Administered 2021-08-28 – 2021-08-31 (×4): 10 mg via ORAL
  Filled 2021-08-28 (×4): qty 1

## 2021-08-28 MED ORDER — PANTOPRAZOLE SODIUM 40 MG PO TBEC
40.0000 mg | DELAYED_RELEASE_TABLET | Freq: Every day | ORAL | Status: DC
  Administered 2021-08-29 – 2021-08-31 (×3): 40 mg via ORAL
  Filled 2021-08-28 (×3): qty 1

## 2021-08-28 MED ORDER — ONDANSETRON HCL 4 MG/2ML IJ SOLN
4.0000 mg | Freq: Four times a day (QID) | INTRAMUSCULAR | Status: DC | PRN
  Administered 2021-08-28: 4 mg via INTRAVENOUS
  Filled 2021-08-28: qty 2

## 2021-08-28 MED ORDER — LOSARTAN POTASSIUM 50 MG PO TABS
50.0000 mg | ORAL_TABLET | Freq: Every day | ORAL | Status: DC
  Administered 2021-08-28 – 2021-08-29 (×2): 50 mg via ORAL
  Filled 2021-08-28 (×2): qty 1

## 2021-08-28 MED ORDER — HEPARIN SODIUM (PORCINE) 1000 UNIT/ML DIALYSIS
20.0000 [IU]/kg | INTRAMUSCULAR | Status: DC | PRN
  Filled 2021-08-28: qty 1

## 2021-08-28 MED ORDER — IOHEXOL 350 MG/ML SOLN
75.0000 mL | Freq: Once | INTRAVENOUS | Status: AC | PRN
  Administered 2021-08-28: 75 mL via INTRAVENOUS

## 2021-08-28 MED FILL — Medication: Qty: 1 | Status: AC

## 2021-08-28 NOTE — Progress Notes (Signed)
Brief cardiac consult  Impression Acute hyperkalemia Severe bradycardia End-stage renal disease Anemia Hypotension Diabetes  . Plan Agree with treatment for hyperkalemia Hold off on temporary pacemaker Once hyperkalemia resolves heart rate will improve Continue dialysis therapy Manage changing hypotension Hypertension management and control Demand ischemia from elevated troponin Continue diabetes management and control No indication for permanent pacemaker

## 2021-08-28 NOTE — Progress Notes (Signed)
[facility], [place] 08/28/21  Subjective:   LOS: 1  Anita Contreras is a 65 y.o. [place] speaking female known to [place] from previous admissions This time she presents for cyanosis and unresponsiveness. Last dialysis treatment was on Saturday. In the ER noted to be bradycardic with HR in 20's. Given shifting measures in the ER- calcium gluconate, sodium bicarbonate, lokelma. Required cardiac pacing. Emergency dialysis the night of 9/19-9/20 This morning patient is alert and awake.  Feels like baseline.  Wants to go home. Reports that she was having vomiting and diarrhea at home Potassium level corrected to 2.7 earlier in the morning.  Repeat BMP at 9:00 shows potassium level to be normal at 4.0   Objective:  Vital signs in last 24 hours:  Temp:  [98.2 F (36.8 C)-98.8 F (37.1 C)] 98.8 F (37.1 C) (09/20 0700) Pulse Rate:  [39-85] 74 (09/20 0800) Resp:  [4-37] 15 (09/20 0800) BP: (78-162)/(43-89) 162/89 (09/20 0700) SpO2:  [95 %-100 %] 100 % (09/20 0800) Weight:  [45.4 kg-51.4 kg] 51.4 kg (09/20 0101)  Weight change:  Filed Weights   08/27/21 2106 08/28/21 0101  Weight: 45.4 kg 51.4 kg    Intake/Output:    Intake/Output Summary (Last 24 hours) at 08/28/2021 1010 Last data filed at 08/28/2021 0600 Gross per 24 hour  Intake 18.5 ml  Output -1 ml  Net 19.5 ml     Physical Exam: General:  Ill appesring, laying on stretcher  HEENT  anicteric, moist oral mucous membrane  Pulm/lungs  normal breathing effort, lungs are clear to auscultation  CVS/Heart  regular rhythm, no rub or gallop  Abdomen:   Soft, nontender  Extremities:  No peripheral edema  Neurologic: lethargic  Skin:  No acute rashes  Right arm AV fistula, + thrill  Basic Metabolic Panel:  Recent Labs  Lab 08/27/21 2101 08/28/21 0243 08/28/21 0340 08/28/21 0525 08/28/21 0910  NA 140 141  --   --  137  K 7.1* >7.5* 4.2 2.7* 4.0  CL 106 103  --   --  96*  CO2 20* 29   --   --  30  GLUCOSE 362* 171*  --   --  82  BUN 59* 66*  --   --  14  CREATININE 6.47* 6.89*  --   --  2.32*  CALCIUM 12.4* 10.2  --   --  8.9  MG 2.5* 2.8*  --   --   --   PHOS  --  4.4  --   --   --       CBC: Recent Labs  Lab 08/27/21 2101 08/28/21 0243  WBC 4.9 14.1*  NEUTROABS 4.1  --   HGB 8.7* 8.7*  HCT 27.8* 26.5*  MCV 110.3* 103.9*  PLT 170 176       Lab Results  Component Value Date   HEPBSAG NON REACTIVE 08/06/2021   HEPBSAB Reactive (A) 08/06/2021   HEPBIGM Negative 04/16/2016      Microbiology:  Recent Results (from the past 240 hour(s))  Resp Panel by RT-PCR (Flu A&B, Covid) Nasopharyngeal Swab     Status: None   Collection Time: 08/27/21  9:11 PM   Specimen: Nasopharyngeal Swab; Nasopharyngeal(NP) swabs in vial transport medium  Result Value Ref Range Status   SARS Coronavirus 2 by RT PCR NEGATIVE NEGATIVE Final    Comment: (NOTE) SARS-CoV-2 target nucleic acids are NOT DETECTED.  The SARS-CoV-2 RNA is generally detectable in upper respiratory specimens during the  acute phase of infection. The lowest concentration of SARS-CoV-2 viral copies this assay can detect is 138 copies/mL. A negative result does not preclude SARS-Cov-2 infection and should not be used as the sole basis for treatment or other patient management decisions. A negative result may occur with  improper specimen collection/handling, submission of specimen other than nasopharyngeal swab, presence of viral mutation(s) within the areas targeted by this assay, and inadequate number of viral copies(<138 copies/mL). A negative result must be combined with clinical observations, patient history, and epidemiological information. The expected result is Negative.  Fact Sheet for Patients:  EntrepreneurPulse.com.au  Fact Sheet for Healthcare Providers:  IncredibleEmployment.be  This test is no t yet approved or cleared by the [place] FDA  and  has been authorized for detection and/or diagnosis of SARS-CoV-2 by FDA under an Emergency Use Authorization (EUA). This EUA will remain  in effect (meaning this test can be used) for the duration of the COVID-19 declaration under Section 564(b)(1) of the Act, 21 U.S.C.section 360bbb-3(b)(1), unless the authorization is terminated  or revoked sooner.       Influenza A by PCR NEGATIVE NEGATIVE Final   Influenza B by PCR NEGATIVE NEGATIVE Final    Comment: (NOTE) The Xpert Xpress SARS-CoV-2/FLU/RSV plus assay is intended as an aid in the diagnosis of influenza from Nasopharyngeal swab specimens and should not be used as a sole basis for treatment. Nasal washings and aspirates are unacceptable for Xpert Xpress SARS-CoV-2/FLU/RSV testing.  Fact Sheet for Patients: EntrepreneurPulse.com.au  Fact Sheet for Healthcare Providers: IncredibleEmployment.be  This test is not yet approved or cleared by the [place] FDA and has been authorized for detection and/or diagnosis of SARS-CoV-2 by FDA under an Emergency Use Authorization (EUA). This EUA will remain in effect (meaning this test can be used) for the duration of the COVID-19 declaration under Section 564(b)(1) of the Act, 21 U.S.C. section 360bbb-3(b)(1), unless the authorization is terminated or revoked.  Performed at [facility], [address] [place], [place] [address]   MRSA Next Gen by PCR, Nasal     Status: None   Collection Time: 08/28/21  1:08 AM   Specimen: Nasal Mucosa; Nasal Swab  Result Value Ref Range Status   MRSA by PCR Next Gen NOT DETECTED NOT DETECTED Final    Comment: (NOTE) The GeneXpert MRSA Assay (FDA approved for NASAL specimens only), is one component of a comprehensive MRSA colonization surveillance program. It is not intended to diagnose MRSA infection nor to guide or monitor treatment for MRSA infections. Test performance is not FDA approved in  patients less than 1 years old. Performed at [facility], [address] [place], [place] [address]     Coagulation Studies: No results for input(s): LABPROT, INR in the last 72 hours.   Urinalysis: No results for input(s): COLORURINE, LABSPEC, PHURINE, GLUCOSEU, HGBUR, BILIRUBINUR, KETONESUR, PROTEINUR, UROBILINOGEN, NITRITE, LEUKOCYTESUR in the last 72 hours.  Invalid input(s): APPERANCEUR    Imaging: DG Chest Portable 1 View  Result Date: 08/27/2021 CLINICAL DATA:  Bradycardia and unresponsiveness EXAM: PORTABLE CHEST 1 VIEW COMPARISON:  02/10/2021 FINDINGS: Cardiac shadow is enlarged but stable. Lungs are well aerated bilaterally. No focal infiltrate or sizable effusion is seen. Stenting is noted in the right shoulder stable from the prior exam. No sizable effusion is noted. No bony abnormality is seen. IMPRESSION: No acute abnormality noted. Electronically Signed   By: Inez Catalina M.D.   On: 08/27/2021 21:16     Medications:  amLODipine  10 mg Oral Daily   Chlorhexidine Gluconate Cloth  6 each Topical Q0600   Chlorhexidine Gluconate Cloth  6 each Topical Q0600   epoetin (EPOGEN/PROCRIT) injection  4,000 Units Intravenous Q T,Th,Sa-HD   heparin  5,000 Units Subcutaneous Q8H   hydrALAZINE  50 mg Oral Q8H   insulin aspart  0-15 Units Subcutaneous Q4H   losartan  50 mg Oral Daily   pantoprazole (PROTONIX) IV  40 mg Intravenous Q24H   docusate sodium, heparin, ondansetron (ZOFRAN) IV, polyethylene glycol  Assessment/ Plan:  65 y.o. female with end-stage renal disease (4 to 5 years), diabetes type 2, hyperlipidemia, history of stroke, diabetic retinopathy was admitted for  Hyperkalemia [E87.5] Bradycardia [R00.1] ESRD (end stage renal disease) ([place]) [N18.6] Altered mental status, unspecified altered mental status type [R41.82] Hyperkalemia  [facility] nephrology/TTS/[place]/right arm AV fistula  #.  Hyperkalemia with end stage renal disease on dialysis Lab  Results  Component Value Date   K 4.0 08/28/2021    Last dialysis treatment on Saturday.  Multiple recent admissions for hyperkalemia Had angioplasty of access on 9/1 Daughter at bedside reports that patient did not miss her treatment Now with hyperkalemia causing bradycardia and peaked T waves Consent for emergent HD obtained from the daughter  Patient underwent emergent dialysis overnight with potassium level now in the normal range Patient feels like she is back to baseline Obtain ultrasound of the hemodialysis access to rule out stenosis  # Secondary hyperparathyroidism of renal origin N 25.81  Monitor calcium and  phos during admission Lab Results  Component Value Date   PTH 183 (H) 11/09/2016   CALCIUM 8.9 08/28/2021   PHOS 4.4 08/28/2021    # anemia of CKD  Lab Results  Component Value Date   HGB 8.7 (L) 08/28/2021    Low dose EPO with HD  #Hypertension Blood pressure is elevated today Restart home medication regimen and reevaluate   LOS: 1 Eidan Muellner 9/20/202210:10 AM

## 2021-08-28 NOTE — Progress Notes (Signed)
Report given to [facility] in [place]... pt will be tx via wheelchair.

## 2021-08-28 NOTE — ED Notes (Signed)
Called ICU to give report, someone to call back

## 2021-08-28 NOTE — Progress Notes (Signed)
BRIEF CRITICAL CARE NOTE  Pt's hyperkalemia and bradycardia resolved today with emergent HD.  Was considering discharge home, however pt was unable wean from 2L supplemental O2 as O2 sats dropped to 87-88% on room air.  Ambulatory saturation obtained on room air, which dropped to 82%.  CTA Chest obtained, negative for PE, does show trace bilateral pleural effusions with compressive atelectasis.  Orders placed for incentive spirometry and flutter valve.  OOB as tolerated.  She remains hemodynamically stable, tolerating 2L [place].  Will transfer to Med-Surg unit with off unit telemetry.  Transfer service to [facility] tomorrow.     Darel Hong, AGACNP-BC [facility] Prefer epic messenger for cross cover needs If after hours, please call E-link

## 2021-08-28 NOTE — Progress Notes (Signed)
NAME:  Anita Contreras, MRN:  [id number], DOB:  [birth year], LOS: 1 ADMISSION DATE:  08/27/2021, CONSULTATION DATE: 08/27/2021 REFERRING MD: Dr. Jari Pigg, CHIEF COMPLAINT:   Loss of consciousness  History of Present Illness:  65 year old female with significant history of ESRD on HD, presented to [facility] ED on 08/27/2021 from home via EMS due to loss of consciousness.  Patient described, with Spanish interpreter assistance, being in her normal state of health until around 4 PM.  She reports feeling extremely fatigued, nauseous and then her vision " went black" and she vomited what she believes was nonbloody bilious emesis.  The patient had no knowledge of transporting to the hospital.  Patient and daughter confirmed that she did not fall to the ground with this syncopal episode.  Both confirmed her last dialysis session was on Saturday as per her normal Tuesday Thursday Saturday schedule.  Patient reports making herself tomato salsa with her morning egg today, otherwise no changes to her diet.  She denies chest pain, dyspnea, no current complaints of blurred vision/dizziness/headache/nausea. Per ED documentation EMS staff reported the patient was found unresponsive with heart rate in the 20s.  They started transcutaneously pacing, administered 2 mg of Versed, calcium and sodium bicarbonate in the field due to concerns for potential hyperkalemia.  ED course: Per ED documentation upon arrival patient was altered with low heart rates in the 20s to 30s.  She received an additional dose of calcium & sodium bicarbonate with the addition of 10 units of insulin.  CBG was in the 400s so D50 was held.  EDP paused TC pacing, but then had to restart within 2 to 3 minutes as the heart rate dropped back down into the 20s.  Cardiologist Dr. Fletcher Anon was consulted due to potential need for temporary pacing wires.  Dr. Fletcher Anon recommended initiation of dopamine drip and verifying potassium level prior to any cardiology  intervention.  After these interventions patient became more alert with heart rate in the 70s and normal blood pressure.  Dr. Candiss Norse was consulted, who recommended [facility] and emergent dialysis.  PCCM was consulted for admission.  Of note patient had similar presentation on 08/06/2021, with hyperkalemia greater than 7.5 and correlating severe bradycardia.  At the time of this admission which was also on a Sunday after her normal Saturday dialysis session. Initial vitals: Significant labs: (Labs/ Imaging personally reviewed) I, Domingo Pulse [place], AGACNP-BC, personally viewed and interpreted this ECG. EKG Interpretation Date: 08/28/2019 EKG Time: 2042 Rate: 52 Rhythm: Sinus bradycardia QRS Axis: Normal Intervals: RSR in V1 ST/T Wave abnormalities: T wave inversion in V1 and aVL, peaked T waves in inferior and precordial leads Narrative Interpretation: Sinus bradycardia with lateral T wave inversion and peaked T waves in inferior and precordial leads suggestive of hyperkalemia Na+: 140, K+:7.1, BUN/Cr.:  59/6.47, Serum CO2/ AG: 20/362 Hgb: 8.7, Troponin: 11, WBC: 4.9 CXR 08/27/2021: No acute abnormality noted  Pertinent  Medical History  ESRD on HD -T TH SA Hyperlipidemia Hypertension Stroke Type 2 diabetes mellitus Anemia Steal syndrome Diabetic retinopathy Cataracts Significant Hospital Events: Including procedures, antibiotic start and stop dates in addition to other pertinent events   08/27/2021: Patient admitted to ICU for emergent dialysis due to severe hyperkalemia causing severe bradycardia requiring dopamine and intermittent TC pacing support. 08/28/2021: Received emergent HD, post dialysis K+ is 2.7 (repeat level pending).  Dopamine weaned off and not currently requiring TC pacing.  Discussed with Nephrology, [place] ordered for RUE to access for patency of fistula (previous issues with  stenosis)  Interim History / Subjective:  Patient alert and responsive, interviewed with  assistance of Spanish interpreter.   Patient reports "feeling much better", denies complaints, Wants to Go home today -Received emergent HD earlier this morning -Post dialysis K+ is 2.7 ~ repeat BMP is pending -Dopamine weaned off and currently not requiring TC pacing -Cardiology consulted -Hypertensive (SBP 190-200's) ~ restarting home Hydralazine, Norvasc, Losartan -Discussed with Nephrology, [place] ordered to assess patency of Right arm fistula (previous issues with stenosis)   Objective   Blood pressure (!) 162/89, pulse 74, temperature 98.8 F (37.1 C), temperature source Oral, resp. rate 15, height '5\' 2"'$  (1.575 m), weight 51.4 kg, last menstrual period 09/19/2001, SpO2 100 %.        Intake/Output Summary (Last 24 hours) at 08/28/2021 0910 Last data filed at 08/28/2021 0600 Gross per 24 hour  Intake 18.5 ml  Output -1 ml  Net 19.5 ml   Filed Weights   08/27/21 2106 08/28/21 0101  Weight: 45.4 kg 51.4 kg    Examination: General: Adult female, chronically ill appearing, sittting in bed, in NAD HEENT: MM pink/moist, anicteric, atraumatic, neck supple Neuro: A&O x 4, able to follow commands and moves all extremities, no focal deficits, PERRL L not reactive/R sluggish (baseline), CV: s1s2 RRR, NSR on monitor, no r/m/g Pulm: Regular, even, nonlabored, breath sounds clear-BUL & diminished-BLL GI: soft, flat, non tender, bs x 4 Skin: no rashes/lesions noted Extremities: warm/dry, pulses + 2 R/P, no edema noted  Resolved Hospital Problem list     Assessment & Plan:  Severe hyperkalemia~ resolved ESRD on HD Creatinine on admission: 6.47, K+ on admission 7.1 (after 2 rounds of calcium and bicarb as well as 10 units of insulin) -Monitor I&O's / urinary output -Follow BMP -Ensure adequate renal perfusion -Avoid nephrotoxic agents as able -Replace electrolytes as indicated -Nephrology following, appreciate input -Hemodialysis as per Nephrology (received emergent treatment  9/20) -Of note patient reports not taking most of prescribed outpatient medication including calcium acetate and Lasix -Nephrology has ordered US of RUE to assess patency of fistula  Severe bradycardia secondary to severe hyperkalemia ~ resolved Hypertension Mildly elevated Troponin, suspect Demand Ischemia PMHx: Hypertension, hyperlipidemia Patient intermittently transcutaneously paced due to heart rate in the 20s, low-dose dopamine drip initiated while shifting measures administered for hyperkalemia. Troponin: 11 -Continuous cardiac monitoring -Maintain SBP < 180 -Trend HS Troponin until peaked (11 ~ 24) -Received emergent Dialysis -Dopamine weaned off, no longer requiring TC pacing -Cardiology consulted, appreciate input -Restart Outpatient antihypertensives: Hydralazine, Amlodipine and losartan  Poorly controlled Type 2 Diabetes Mellitus Glucose 424 on admission - Monitor CBG Q 4 hours - SSI moderate dosing - target range while in ICU: 140-180 - follow ICU hyper/hypo-glycemia protocol  Best Practice (right click and "Reselect all SmartList Selections" daily)  Diet/type: Renal DVT prophylaxis: prophylactic heparin  GI prophylaxis: PPI Lines: N/A Foley:  N/A Code Status:  full code Last date of multidisciplinary goals of care discussion [08/28/21]  Labs   CBC: Recent Labs  Lab 08/27/21 2101 08/28/21 0243  WBC 4.9 14.1*  NEUTROABS 4.1  --   HGB 8.7* 8.7*  HCT 27.8* 26.5*  MCV 110.3* 103.9*  PLT 170 176     Basic Metabolic Panel: Recent Labs  Lab 08/27/21 2101 08/28/21 0243 08/28/21 0340 08/28/21 0525  NA 140 141  --   --   K 7.1* >7.5* 4.2 2.7*  CL 106 103  --   --   CO2 20* 29  --   --  GLUCOSE 362* 171*  --   --   BUN 59* 66*  --   --   CREATININE 6.47* 6.89*  --   --   CALCIUM 12.4* 10.2  --   --   MG 2.5* 2.8*  --   --   PHOS  --  4.4  --   --    GFR: Estimated Creatinine Clearance: 6.4 mL/min (A) (by C-G formula based on SCr of 6.89 mg/dL  (H)). Recent Labs  Lab 08/27/21 2101 08/28/21 0243  WBC 4.9 14.1*     Liver Function Tests: Recent Labs  Lab 08/27/21 2101  AST 270*  ALT 125*  ALKPHOS 137*  BILITOT 0.9  PROT 5.8*  ALBUMIN 3.1*   No results for input(s): LIPASE, AMYLASE in the last 168 hours. No results for input(s): AMMONIA in the last 168 hours.  ABG    Component Value Date/Time   PHART 7.26 (L) 01/14/2017 0813   PCO2ART 52 (H) 01/14/2017 0813   PO2ART 71 (L) 01/14/2017 0813   HCO3 36.6 (H) 02/10/2021 1903   ACIDBASEDEF 4.1 (H) 01/14/2017 0813   O2SAT 99.8 02/10/2021 1903      Coagulation Profile: No results for input(s): INR, PROTIME in the last 168 hours.  Cardiac Enzymes: No results for input(s): CKTOTAL, CKMB, CKMBINDEX, TROPONINI in the last 168 hours.  HbA1C: Hgb A1c MFr Bld  Date/Time Value Ref Range Status  08/07/2021 06:07 AM 6.0 (H) 4.8 - 5.6 % Final    Comment:    (NOTE)         Prediabetes: 5.7 - 6.4         Diabetes: >6.4         Glycemic control for adults with diabetes: <7.0   08/08/2020 04:58 AM 6.4 (H) 4.8 - 5.6 % Final    Comment:    (NOTE) Pre diabetes:          5.7%-6.4%  Diabetes:              >6.4%  Glycemic control for   <7.0% adults with diabetes     CBG: Recent Labs  Lab 08/27/21 2040 08/28/21 0337 08/28/21 0722  GLUCAP 424* 119* 83    Review of Systems: Positives in BOLD  Pt currently denies all complaints Gen: Denies fever, chills, weight change, fatigue, night sweats HEENT: Denies blurred vision, double vision, hearing loss, tinnitus, sinus congestion, rhinorrhea, sore throat, neck stiffness, dysphagia PULM: Denies shortness of breath, cough, sputum production, hemoptysis, wheezing CV: Denies chest pain, edema, orthopnea, paroxysmal nocturnal dyspnea, palpitations GI: Denies abdominal pain, nausea, vomiting, diarrhea, hematochezia, melena, constipation, change in bowel habits GU: Denies dysuria, hematuria, polyuria, oliguria, urethral  discharge Endocrine: Denies hot or cold intolerance, polyuria, polyphagia or appetite change Derm: Denies rash, dry skin, scaling or peeling skin change Heme: Denies easy bruising, bleeding, bleeding gums Neuro: Denies headache, numbness, weakness, slurred speech, loss of memory or consciousness  Past Medical History:  She,  has a past medical history of Acute cystitis with hematuria, Anemia, Cataracts, bilateral, CKD (chronic kidney disease), Diabetic retinopathy, background ([place]), Dialysis patient ([place]), Dizziness, Hyperlipidemia, Hypertension, Migraine variant, Neuropathy, OP (osteoporosis), Personal history of noncompliance with medical treatment, presenting hazards to health, Steal syndrome as complication of dialysis access [facility]), Stroke ([place]), Type II diabetes mellitus with ophthalmic manifestations, uncontrolled ([place]), and Vitamin D deficiency.   Surgical History:   Past Surgical History:  Procedure Laterality Date   A/V FISTULAGRAM Right 06/03/2018   Procedure: A/V FISTULAGRAM;  Surgeon: Lucky Cowboy,  Erskine Squibb, MD;  Location: [place] [facility];  Service: Cardiovascular;  Laterality: Right;   A/V FISTULAGRAM Right 02/22/2019   Procedure: A/V FISTULAGRAM;  Surgeon: Algernon Huxley, MD;  Location: [place] [facility];  Service: Cardiovascular;  Laterality: Right;   A/V FISTULAGRAM Right 08/09/2021   Procedure: A/V FISTULAGRAM;  Surgeon: Algernon Huxley, MD;  Location: [place] [facility];  Service: Cardiovascular;  Laterality: Right;   A/V SHUNT INTERVENTION N/A 01/26/2018   Procedure: A/V SHUNT INTERVENTION;  Surgeon: Algernon Huxley, MD;  Location: [place] [facility];  Service: Cardiovascular;  Laterality: N/A;   A/V SHUNTOGRAM Right 01/26/2018   Procedure: A/V SHUNTOGRAM;  Surgeon: Algernon Huxley, MD;  Location: [place] [facility];  Service: Cardiovascular;  Laterality: Right;   AV FISTULA PLACEMENT Right 10/10/2016   Procedure: ARTERIOVENOUS (AV) FISTULA CREATION ( BRACHIOCEPHALIC );  Surgeon: Algernon Huxley, MD;  Location: [facility];  Service: Vascular;  Laterality: Right;   DILATION AND CURETTAGE OF UTERUS     DISTAL REVASCULARIZATION AND INTERVAL LIGATION (DRIL) Right 11/08/2016   Procedure: DISTAL REVASCULARIZATION AND INTERVAL LIGATION (DRIL);  Surgeon: Algernon Huxley, MD;  Location: [facility];  Service: Vascular;  Laterality: Right;   PERIPHERAL VASCULAR CATHETERIZATION N/A 04/25/2016   Procedure: Dialysis/Perma Catheter Insertion;  Surgeon: Algernon Huxley, MD;  Location: [place] [facility];  Service: Cardiovascular;  Laterality: N/A;   PERIPHERAL VASCULAR CATHETERIZATION Right 10/17/2016   Procedure: Upper Extremity Angiography;  Surgeon: Algernon Huxley, MD;  Location: [place] [facility];  Service: Cardiovascular;  Laterality: Right;   UPPER EXTREMITY ANGIOGRAPHY Right 10/12/2018   Procedure: UPPER EXTREMITY ANGIOGRAPHY;  Surgeon: Algernon Huxley, MD;  Location: [place] [facility];  Service: Cardiovascular;  Laterality: Right;     Social History:   reports that she has never smoked. She has never used smokeless tobacco. She reports that she does not drink alcohol and does not use drugs.   Family History:  Her family history includes Hyperlipidemia in her mother; Hypertension in her mother.   Allergies Allergies  Allergen Reactions   Dextromethorphan-Guaifenesin     Other reaction(s): Itching of skin (finding)   Propofol Itching    Pt says " when getting anesthesia by body itches" unsure what type of anesthesia     Home Medications  Prior to Admission medications   Medication Sig Start Date End Date Taking? Authorizing Provider  acetaminophen (TYLENOL) 500 MG tablet Take 1,000 mg by mouth every 6 (six) hours as needed for mild pain or headache.    [provider]  amLODipine (NORVASC) 10 MG tablet Take 1 tablet (10 mg total) by mouth daily. 01/27/18   Bettey Costa, MD  aspirin 81 MG EC tablet TAKE 1 TABLET BY MOUTH EVERY DAY Patient not taking: Reported on 08/06/2021 04/15/17    Stegmayer, Janalyn Harder, PA-C  atorvastatin (LIPITOR) 40 MG tablet Take 40 mg by mouth daily at 6 PM.  Patient not taking: Reported on 08/06/2021    [provider]  calcium acetate (PHOSLO) 667 MG capsule Take 1 capsule (667 mg total) by mouth 3 (three) times daily with meals. Patient not taking: Reported on 08/06/2021 04/19/16   Fritzi Mandes, MD  furosemide (LASIX) 20 MG tablet Take 20 mg by mouth daily. Patient not taking: Reported on 08/06/2021    [provider]  hydrOXYzine (ATARAX/VISTARIL) 50 MG tablet Take 25 mg by mouth 2 (two) times daily as needed for itching.  [provider]  insulin NPH-regular Human (70-30) 100 UNIT/ML injection Inject 3 Units into the skin 2 (two) times daily with a meal.    [provider]  lidocaine-prilocaine (EMLA) cream Apply 1 application topically as needed. Patient not taking: Reported on 08/06/2021    [provider]  losartan (COZAAR) 50 MG tablet Take 1 tablet (50 mg total) by mouth daily. 01/27/18   Bettey Costa, MD  pantoprazole (PROTONIX) 40 MG tablet Take 1 tablet (40 mg total) by mouth daily. Patient not taking: Reported on 08/06/2021 08/12/20   Loletha Grayer, MD     Critical care time: 35 minutes     Darel Hong, AGACNP-BC [facility] Prefer epic messenger for cross cover needs If after hours, please call E-link

## 2021-08-28 NOTE — Progress Notes (Signed)
Inpatient Diabetes Program Recommendations  AACE/ADA: New Consensus Statement on Inpatient Glycemic Control  Target Ranges:  Prepandial:   less than 140 mg/dL      Peak postprandial:   less than 180 mg/dL (1-2 hours)      Critically ill patients:  140 - 180 mg/dL   Results for Anita Contreras, Anita Contreras (MRN [id number]) as of 08/28/2021 10:41  Ref. Range 08/27/2021 20:40 08/28/2021 00:58 08/28/2021 03:37 08/28/2021 07:22  Glucose-Capillary Latest Ref Range: 70 - 99 mg/dL 424 (H) 92 119 (H) 83   Review of Glycemic Control  Diabetes history: DM2 Outpatient Diabetes medications: 70/30 3 units BID Current orders for Inpatient glycemic control: Novolog 0-15 units Q4H  Inpatient Diabetes Program Recommendations:    Insulin: Please decrease Novolog correction to 0-9 units Q4H.  NOTE: Initial glucose 424 mg/dl on 08/27/21 at 20:40. No insulin given since admitted and glucose 83 mg/dl  this morning.  Thanks, Barnie Alderman, RN, MSN, CDE Diabetes Coordinator Inpatient Diabetes Program [phone] (Team Pager from 8am to 5pm)

## 2021-08-28 NOTE — Progress Notes (Signed)
PHARMACIST - PHYSICIAN COMMUNICATION  CONCERNING: IV to Oral Route Change Policy  RECOMMENDATION: This patient is receiving pantoprazole by the intravenous route.  Based on criteria approved by the Pharmacy and Therapeutics Committee, the intravenous medication(s) is/are being converted to the equivalent oral dose form(s).   DESCRIPTION: These criteria include: The patient is eating (either orally or via tube) and/or has been taking other orally administered medications for a least 24 hours The patient has no evidence of active gastrointestinal bleeding or impaired GI absorption (gastrectomy, short bowel, patient on TNA or NPO).  If you have questions about this conversion, please contact the [place], [facility] 08/28/2021 11:17 AM

## 2021-08-28 NOTE — Progress Notes (Signed)
Pt. completed tx without further incident of n/v. RUA AVF maintained prescribed BFR, UF reached. Labs drawn. No concerns.

## 2021-08-28 NOTE — Progress Notes (Signed)
Hypoglycemic Event  CBG: 40  Treatment: 8 oz juice/soda  Symptoms:  Lathergic  Follow-up CBG: Time:2337 CBG Result:139  Possible Reasons for Event: Inadequate meal intake  Comments/MD notified: Implemented ICU hypoglycemic standing orders.     Oswaldo Conroy

## 2021-08-28 NOTE — Consult Note (Signed)
PHARMACY CONSULT NOTE  Pharmacy Consult for Electrolyte Monitoring and Replacement   Recent Labs: Potassium (mmol/L)  Date Value  08/28/2021 4.0   Magnesium (mg/dL)  Date Value  08/28/2021 2.8 (H)   Calcium (mg/dL)  Date Value  08/28/2021 8.9   Albumin (g/dL)  Date Value  08/27/2021 3.1 (L)   Phosphorus (mg/dL)  Date Value  08/28/2021 4.4   Sodium (mmol/L)  Date Value  08/28/2021 137   Assessment: Patient is a 65 y/o F with medical history including ESRD on HD Tu/Th/Sa, HTN, HLD, Hx CVA, diabetes, anemia, steal syndrome who was BIBEMS for LOC, unresponsiveness, bradycardia. Patient was found to be hyperkalemic with a potassium of > 7.5. Patient was taken for emergent HD. Pharmacy consulted to assist with electrolyte monitoring and replacement as indicated.  Goal of Therapy:  K > 3 All other electrolytes within normal limits  Plan:  --Hyperkalemia resolved. Potassium of 4 this AM --Patient is on losartan as outpatient. Only one recent fill evident in reconciling outside data which was on 08/21/21. Query whether contributing to presentation given patient reports adherence to dialysis --No electrolyte replacement warranted at this time --Will continue to monitor  Benita Gutter 08/28/2021 11:10 AM

## 2021-08-28 NOTE — Progress Notes (Signed)
[facility] Progress Note Patient Name: Anita Contreras DOB: [birth year] MRN: [id number]   Date of Service  08/28/2021  HPI/Events of Note  Pt with esrd on HD presented with severe hyperkalemia causing bradycardia  eICU Interventions  - during video exam, pt is still getting HD - pt on a dopamine infusion at 2.5 mg/kg/min     Intervention Category Intermediate Interventions: Arrhythmia - evaluation and management  Tilden Dome 08/28/2021, 1:53 AM

## 2021-08-28 NOTE — ED Notes (Signed)
Report given.

## 2021-08-28 NOTE — Progress Notes (Signed)
Date and time results received: 08/28/21 at 0525  Test: K Critical Value: 2.7  Name of Provider Notified: Domingo Pulse NP  Orders Received? Or Actions Taken?: Actions Taken: Notified HD nurse and Domingo Pulse NP  HD nurse sts she will notify nephrology to see what they want [place] to do... Sts she will call me as soon as she gets an answer.  Will wait on HD nurse for further instructions.

## 2021-08-28 NOTE — Progress Notes (Signed)
Pt came from the ED A&O and following commands.... She came up w/only one black shoe (crocs).... ED nurse said she they couldn't find it but will go back down and try to look for it.

## 2021-08-29 LAB — GLUCOSE, CAPILLARY
Glucose-Capillary: 40 mg/dL — CL (ref 70–99)
Glucose-Capillary: 118 mg/dL — ABNORMAL HIGH (ref 70–99)
Glucose-Capillary: 118 mg/dL — ABNORMAL HIGH (ref 70–99)
Glucose-Capillary: 141 mg/dL — ABNORMAL HIGH (ref 70–99)
Glucose-Capillary: 129 mg/dL — ABNORMAL HIGH (ref 70–99)
Glucose-Capillary: 147 mg/dL — ABNORMAL HIGH (ref 70–99)

## 2021-08-29 LAB — CBC
HCT: 23.9 % — ABNORMAL LOW (ref 36.0–46.0)
Hemoglobin: 7.8 g/dL — ABNORMAL LOW (ref 12.0–15.0)
MCH: 33.6 pg (ref 26.0–34.0)
MCHC: 32.6 g/dL (ref 30.0–36.0)
MCV: 103 fL — ABNORMAL HIGH (ref 80.0–100.0)
Platelets: 139 10*3/uL — ABNORMAL LOW (ref 150–400)
RBC: 2.32 MIL/uL — ABNORMAL LOW (ref 3.87–5.11)
RDW: 13.9 % (ref 11.5–15.5)
WBC: 6.3 10*3/uL (ref 4.0–10.5)
nRBC: 0 % (ref 0.0–0.2)

## 2021-08-29 LAB — RENAL FUNCTION PANEL
Albumin: 3 g/dL — ABNORMAL LOW (ref 3.5–5.0)
Anion gap: 9 (ref 5–15)
BUN: 27 mg/dL — ABNORMAL HIGH (ref 8–23)
CO2: 31 mmol/L (ref 22–32)
Calcium: 7.8 mg/dL — ABNORMAL LOW (ref 8.9–10.3)
Chloride: 95 mmol/L — ABNORMAL LOW (ref 98–111)
Creatinine, Ser: 4.42 mg/dL — ABNORMAL HIGH (ref 0.44–1.00)
GFR, Estimated: 11 mL/min — ABNORMAL LOW (ref >60)
Glucose, Bld: 104 mg/dL — ABNORMAL HIGH (ref 70–99)
Phosphorus: 4 mg/dL (ref 2.5–4.6)
Potassium: 4.8 mmol/L (ref 3.5–5.1)
Sodium: 135 mmol/L (ref 135–145)

## 2021-08-29 MED ORDER — EPOETIN ALFA 10000 UNIT/ML IJ SOLN
10000.0000 [IU] | INTRAMUSCULAR | Status: DC
  Administered 2021-08-30: 10000 [IU] via INTRAVENOUS

## 2021-08-29 MED ORDER — INSULIN ASPART 100 UNIT/ML IJ SOLN
0.0000 [IU] | Freq: Three times a day (TID) | INTRAMUSCULAR | Status: DC
  Administered 2021-08-30 – 2021-08-31 (×2): 2 [IU] via SUBCUTANEOUS
  Filled 2021-08-29: qty 1

## 2021-08-29 NOTE — Progress Notes (Signed)
Inpatient Diabetes Program Recommendations  AACE/ADA: New Consensus Statement on Inpatient Glycemic Control (2015)  Target Ranges:  Prepandial:   less than 140 mg/dL      Peak postprandial:   less than 180 mg/dL (1-2 hours)      Critically ill patients:  140 - 180 mg/dL   Lab Results  Component Value Date   GLUCAP 118 (H) 08/29/2021   HGBA1C 6.0 (H) 08/07/2021    Review of Glycemic Control Results for Anita Contreras, Anita Contreras (MRN [id number]) as of 08/29/2021 10:55  Ref. Range 08/28/2021 07:22 08/28/2021 11:54 08/28/2021 15:33 08/28/2021 19:20 08/28/2021 23:06 08/28/2021 23:10 08/28/2021 23:37 08/29/2021 04:07 08/29/2021 08:31  Glucose-Capillary Latest Ref Range: 70 - 99 mg/dL 83 81 126 (H) 186 (H) Novolog 3 units 46 (L) 40 (LL) 139 (H) 118 (H) 118 (H)   Diabetes history: DM2 Outpatient Diabetes medications: 70/30 3 units BID Current orders for Inpatient glycemic control: Novolog 0-15 units Q4H   Inpatient Diabetes Program Recommendations:   Patient had hypoglycemia post Novolog correction.  Insulin: Please decrease Novolog correction to 0-9 units Q4H. Secure chat sent to Dr. Maylene Roes.  Thank you, Anita Contreras. Anita Shumard, RN, MSN, CDE  Diabetes Coordinator Inpatient Glycemic Control Team Team Pager [phone] (8am-5pm) 08/29/2021 10:57 AM

## 2021-08-29 NOTE — Progress Notes (Signed)
PROGRESS NOTE    Anita Contreras  [id number] DOB: [birth year] DOA: 08/27/2021 PCP: Frazier Richards, MD     Brief Narrative:  Anita Contreras is a 65 year old female with significant history of ESRD on HD, presented to [facility] ED on 08/27/2021 from home via EMS due to loss of consciousness.  Patient described, with Spanish interpreter assistance, being in her normal state of health until around 4 PM.  She reports feeling extremely fatigued, nauseous and then her vision " went black" and she vomited what she believes was nonbloody bilious emesis.  The patient had no knowledge of transporting to the hospital.  Patient and daughter confirmed that she did not fall to the ground with this syncopal episode.  Both confirmed her last dialysis session was on Saturday as per her normal Tuesday Thursday Saturday schedule.  Patient reports making herself tomato salsa with her morning egg today, otherwise no changes to her diet.  She denies chest pain, dyspnea, no current complaints of blurred vision/dizziness/headache/nausea. Per ED documentation EMS staff reported the patient was found unresponsive with heart rate in the 20s.  They started transcutaneously pacing, administered 2 mg of Versed, calcium and sodium bicarbonate in the field due to concerns for potential hyperkalemia. Per ED documentation upon arrival patient was altered with low heart rates in the 20s to 30s.  She received an additional dose of calcium & sodium bicarbonate with the addition of 10 units of insulin.  CBG was in the 400s so D50 was held.  EDP paused TC pacing, but then had to restart within 2 to 3 minutes as the heart rate dropped back down into the 20s.  Cardiologist Dr. Fletcher Anon was consulted due to potential need for temporary pacing wires.  Dr. Fletcher Anon recommended initiation of dopamine drip and verifying potassium level prior to any cardiology intervention. After these interventions patient became more alert with heart rate in  the 70s and normal blood pressure.  Dr. Candiss Norse was consulted, who recommended [facility] and emergent dialysis.  PCCM was consulted for admission.  After emergent dialysis, potassium level normalized and bradycardia resolved.  New events last 24 hours / Subjective: Patient seen with iPad interpreter.  Patient voices no new complaints or concerns, wants to go home.  She denies any chest pain, shortness of breath, nausea, vomiting, diarrhea, abdominal pain, dysuria or open skin wounds.  She had a documented fever 101.4 yesterday.  Assessment & Plan:   Principal Problem:   Hyperkalemia Active Problems:   ESRD (end stage renal disease) ([place])   Hyperlipidemia   Essential hypertension   Type 2 diabetes mellitus with ESRD (end-stage renal disease) ([facility])   Bradycardia   Syncope   Altered mental status   Severe hyperkalemia -Resolved after dialysis  Severe bradycardia secondary to hyperkalemia -Status post transcutaneous pacing, dopamine drip -Appreciate cardiology -Resolved after dialysis.  Normal sinus rhythm with heart rate 60s to 70s sitting  ESRD on HD TTS -Nephrology following  Fever -?Source -Blood cultures, urinalysis ordered  Acute hypoxemic respiratory failure -CTA chest negative for PE, no focal consolidation seen -Wean oxygen to room air  Diabetes mellitus type 2 with hyperglycemia -Continue sliding scale insulin, dose reduced today due to hypoglycemia   Hypertension -Continue Norvasc, hydralazine, Cozaar   DVT prophylaxis:  heparin injection 5,000 Units Start: 08/27/21 2245 SCDs Start: 08/27/21 2230  Code Status:  Code Status History     Date Active Date Inactive Code Status Order ID Comments User Context   08/27/2021 2232 08/27/2021 2308 Full Code [id number]  Rust-Chester, Huel Cote, NP ED   08/06/2021 2025 08/07/2021 1859 Full Code [id number]  Rust-Chester, Huel Cote, NP ED   02/10/2021 1953 02/12/2021 0032 Full Code [id number]  Para Skeans, MD ED   08/07/2020 1210  08/11/2020 2311 Full Code [id number]  Vashti Hey, MD ED   02/24/2018 1247 02/25/2018 1737 Full Code [id number]  Dustin Flock, MD Inpatient   01/26/2018 1533 01/27/2018 2212 Full Code [id number]  Bettey Costa, MD Inpatient   12/24/2017 0131 12/25/2017 2058 Full Code [id number]  Lance Coon, MD Inpatient   01/14/2017 0925 01/17/2017 1803 Full Code [id number]  Laverle Hobby, MD ED   11/08/2016 1808 11/10/2016 1754 Full Code [id number]  Algernon Huxley, MD Inpatient   10/17/2016 1319 10/17/2016 1924 Full Code [id number]  Algernon Huxley, MD Inpatient   04/23/2016 2029 05/07/2016 2001 Full Code [id number]  Vaughan Basta, MD Inpatient   04/15/2016 1918 04/19/2016 1608 Full Code [id number]  Gladstone Lighter, MD Inpatient      Questions for Most Recent Historical Code Status (Order [id number])       Family Communication: No family at bedside Disposition Plan:  Status is: Inpatient  Remains inpatient appropriate because:IV treatments appropriate due to intensity of illness or inability to take PO  Dispo: The patient is from: Home              Anticipated d/c is to: Home              Patient currently is not medically stable to d/c.   Difficult to place patient No    Antimicrobials:  Anti-infectives (From admission, onward)    None        Objective: Vitals:   08/29/21 0500 08/29/21 0530 08/29/21 0540 08/29/21 0835  BP:  (!) 129/45  (!) 141/52  Pulse:  74  72  Resp:  16  18  Temp:  (!) 101.4 F (38.6 C) 99.2 F (37.3 C) 99.3 F (37.4 C)  TempSrc:  Oral Oral Oral  SpO2:  99%  98%  Weight: 49.3 kg     Height:        Intake/Output Summary (Last 24 hours) at 08/29/2021 1343 Last data filed at 08/29/2021 1054 Gross per 24 hour  Intake 1155.89 ml  Output --  Net 1155.89 ml   Filed Weights   08/27/21 2106 08/28/21 0101 08/29/21 0500  Weight: 45.4 kg 51.4 kg 49.3 kg    Examination:  General exam: Appears calm and comfortable  Respiratory system: Clear to auscultation.  Respiratory effort normal. No respiratory distress. No conversational dyspnea.  Cardiovascular system: S1 & S2 heard, RRR. No murmurs. No pedal edema. Gastrointestinal system: Abdomen is nondistended, soft and nontender. Normal bowel sounds heard. Central nervous system: Alert and oriented. No focal neurological deficits. Speech clear.  Extremities: Symmetric in appearance , right upper extremity fistula without erythema or tenderness to palpation. + Palpable thrill Skin: No rashes, lesions or ulcers on exposed skin  Psychiatry: Judgement and insight appear normal. Mood & affect appropriate.   Data Reviewed: I have personally reviewed following labs and imaging studies  CBC: Recent Labs  Lab 08/27/21 2101 08/28/21 0243 08/29/21 0415  WBC 4.9 14.1* 6.3  NEUTROABS 4.1  --   --   HGB 8.7* 8.7* 7.8*  HCT 27.8* 26.5* 23.9*  MCV 110.3* 103.9* 103.0*  PLT 170 176 [id number]*   Basic Metabolic Panel: Recent Labs  Lab 08/27/21 2101 08/28/21 0243 08/28/21 0340 08/28/21 0525 08/28/21 0910 08/29/21 0415  NA 140 141  --   --  137 135  K 7.1* >7.5* 4.2 2.7* 4.0 4.8  CL 106 103  --   --  96* 95*  CO2 20* 29  --   --  30 31  GLUCOSE 362* 171*  --   --  82 104*  BUN 59* 66*  --   --  14 27*  CREATININE 6.47* 6.89*  --   --  2.32* 4.42*  CALCIUM 12.4* 10.2  --   --  8.9 7.8*  MG 2.5* 2.8*  --   --   --   --   PHOS  --  4.4  --   --   --  4.0   GFR: Estimated Creatinine Clearance: 9.9 mL/min (A) (by C-G formula based on SCr of 4.42 mg/dL (H)). Liver Function Tests: Recent Labs  Lab 08/27/21 2101 08/29/21 0415  AST 270*  --   ALT 125*  --   ALKPHOS 137*  --   BILITOT 0.9  --   PROT 5.8*  --   ALBUMIN 3.1* 3.0*   No results for input(s): LIPASE, AMYLASE in the last 168 hours. No results for input(s): AMMONIA in the last 168 hours. Coagulation Profile: No results for input(s): INR, PROTIME in the last 168 hours. Cardiac Enzymes: No results for input(s): CKTOTAL, CKMB, CKMBINDEX,  TROPONINI in the last 168 hours. BNP (last 3 results) No results for input(s): PROBNP in the last 8760 hours. HbA1C: No results for input(s): HGBA1C in the last 72 hours. CBG: Recent Labs  Lab 08/28/21 2310 08/28/21 2337 08/29/21 0407 08/29/21 0831 08/29/21 1136  GLUCAP 40* 139* 118* 118* 141*   Lipid Profile: No results for input(s): CHOL, HDL, LDLCALC, TRIG, CHOLHDL, LDLDIRECT in the last 72 hours. Thyroid Function Tests: No results for input(s): TSH, T4TOTAL, FREET4, T3FREE, THYROIDAB in the last 72 hours. Anemia Panel: No results for input(s): VITAMINB12, FOLATE, FERRITIN, TIBC, IRON, RETICCTPCT in the last 72 hours. Sepsis Labs: No results for input(s): PROCALCITON, LATICACIDVEN in the last 168 hours.  Recent Results (from the past 240 hour(s))  Resp Panel by RT-PCR (Flu A&B, Covid) Nasopharyngeal Swab     Status: None   Collection Time: 08/27/21  9:11 PM   Specimen: Nasopharyngeal Swab; Nasopharyngeal(NP) swabs in vial transport medium  Result Value Ref Range Status   SARS Coronavirus 2 by RT PCR NEGATIVE NEGATIVE Final    Comment: (NOTE) SARS-CoV-2 target nucleic acids are NOT DETECTED.  The SARS-CoV-2 RNA is generally detectable in upper respiratory specimens during the acute phase of infection. The lowest concentration of SARS-CoV-2 viral copies this assay can detect is 138 copies/mL. A negative result does not preclude SARS-Cov-2 infection and should not be used as the sole basis for treatment or other patient management decisions. A negative result may occur with  improper specimen collection/handling, submission of specimen other than nasopharyngeal swab, presence of viral mutation(s) within the areas targeted by this assay, and inadequate number of viral copies(<138 copies/mL). A negative result must be combined with clinical observations, patient history, and epidemiological information. The expected result is Negative.  Fact Sheet for Patients:   EntrepreneurPulse.com.au  Fact Sheet for Healthcare Providers:  IncredibleEmployment.be  This test is no t yet approved or cleared by the [place] FDA and  has been authorized for detection and/or diagnosis of SARS-CoV-2 by FDA under an Emergency Use Authorization (EUA). This EUA will remain  in effect (meaning this test can be used) for the duration of the COVID-19 declaration under Section 564(b)(1) of the Act,  21 U.S.C.section 360bbb-3(b)(1), unless the authorization is terminated  or revoked sooner.       Influenza A by PCR NEGATIVE NEGATIVE Final   Influenza B by PCR NEGATIVE NEGATIVE Final    Comment: (NOTE) The Xpert Xpress SARS-CoV-2/FLU/RSV plus assay is intended as an aid in the diagnosis of influenza from Nasopharyngeal swab specimens and should not be used as a sole basis for treatment. Nasal washings and aspirates are unacceptable for Xpert Xpress SARS-CoV-2/FLU/RSV testing.  Fact Sheet for Patients: EntrepreneurPulse.com.au  Fact Sheet for Healthcare Providers: IncredibleEmployment.be  This test is not yet approved or cleared by the [place] FDA and has been authorized for detection and/or diagnosis of SARS-CoV-2 by FDA under an Emergency Use Authorization (EUA). This EUA will remain in effect (meaning this test can be used) for the duration of the COVID-19 declaration under Section 564(b)(1) of the Act, 21 U.S.C. section 360bbb-3(b)(1), unless the authorization is terminated or revoked.  Performed at [facility], [address] [place], [place] [address]   MRSA Next Gen by PCR, Nasal     Status: None   Collection Time: 08/28/21  1:08 AM   Specimen: Nasal Mucosa; Nasal Swab  Result Value Ref Range Status   MRSA by PCR Next Gen NOT DETECTED NOT DETECTED Final    Comment: (NOTE) The GeneXpert MRSA Assay (FDA approved for NASAL specimens only), is one component of a  comprehensive MRSA colonization surveillance program. It is not intended to diagnose MRSA infection nor to guide or monitor treatment for MRSA infections. Test performance is not FDA approved in patients less than 49 years old. Performed at [facility], [address], [place], [place] [address]       Radiology Studies: CT Angio Chest Pulmonary Embolism (PE) W or WO Contrast  Result Date: 08/28/2021 CLINICAL DATA:  PE suspected, low/intermediate prob, positive D-dimer, shortness of breath EXAM: CT ANGIOGRAPHY CHEST WITH CONTRAST TECHNIQUE: Multidetector CT imaging of the chest was performed using the standard protocol during bolus administration of intravenous contrast. Multiplanar CT image reconstructions and MIPs were obtained to evaluate the vascular anatomy. CONTRAST:  45m OMNIPAQUE IOHEXOL 350 MG/ML SOLN COMPARISON:  CT 02/10/2021, chest x-ray 08/27/2021 FINDINGS: Cardiovascular: Satisfactory opacification of the pulmonary arteries to the segmental level. No evidence of pulmonary embolism. Slight respiratory motion degradation particularly at the lung bases. Thoracic aorta is nonaneurysmal. Scattered atherosclerotic calcifications of the aorta and coronary arteries. Heart size is mildly enlarged. No pericardial effusion. Mediastinum/Nodes: Overall decrease in size of multiple prominent mediastinal lymph nodes including 8 mm lower left paratracheal node and 6 mm AP window node. No axillary or hilar lymphadenopathy. Trachea and esophagus within normal limits. Subcentimeter right thyroid lobe nodule. Not clinically significant; no follow-up imaging recommended (ref: J Am Coll Radiol. 2015 Feb;12(2): 143-50). Lungs/Pleura: Trace bilateral pleural effusions, right greater than left with associated compressive atelectasis. Lungs are otherwise clear. No pneumothorax. Upper Abdomen: Reflux of contrast into the IVC and hepatic veins. No acute findings are seen within the included upper abdomen.  Musculoskeletal: No chest wall abnormality. No acute or significant osseous findings. Review of the MIP images confirms the above findings. IMPRESSION: 1. Negative for pulmonary embolism. 2. Trace bilateral pleural effusions, right greater than left, with associated compressive atelectasis. 3. Overall decrease in size of multiple prominent mediastinal lymph nodes, which were likely reactive. 4. Reflux of contrast into the IVC and hepatic veins, suggesting right heart dysfunction. Aortic Atherosclerosis (ICD10-I70.0). Electronically Signed   By: NDavina PokeD.O.  On: 08/28/2021 18:27   [place] Dialysis Access  Result Date: 08/28/2021 CLINICAL DATA:  Right brachiocephalic fistula. EXAM: ULTRASOUND DIALYSIS ACCESS TECHNIQUE: Sonographic grayscale and duplex with spectral analysis and color-flow of right brachiocephalic fistula was performed. COMPARISON:  None. FINDINGS: Right brachiocephalic fistula is patent. Long segment stent is noted. Velocities throughout the stent range between 105 and 169 centimeters/second. No evidence of significant stenosis within the stent. Mildly elevated velocity noted in the brachial artery just proximal to the anastomosis measuring 333 millimeters/second. Normal velocities noted within the Peri anastomotic venous outflow measuring 169 centimeters/second. IMPRESSION: Patent right brachiocephalic fistula without evidence of significant stenosis. 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 Electronically Signed   By: Miachel Roux M.D.   On: 08/28/2021 10:50   DG Chest Portable 1 View  Result Date: 08/27/2021 CLINICAL DATA:  Bradycardia and unresponsiveness EXAM: PORTABLE CHEST 1 VIEW COMPARISON:  02/10/2021 FINDINGS: Cardiac shadow is enlarged but stable. Lungs are well aerated bilaterally. No focal infiltrate or sizable effusion is seen. Stenting is noted in the right shoulder stable from the prior exam. No sizable effusion is noted. No bony  abnormality is seen. IMPRESSION: No acute abnormality noted. Electronically Signed   By: Inez Catalina M.D.   On: 08/27/2021 21:16      Scheduled Meds:  amLODipine  10 mg Oral Daily   [START ON 08/30/2021] epoetin (EPOGEN/PROCRIT) injection  10,000 Units Intravenous Q T,Th,Sa-HD   heparin  5,000 Units Subcutaneous Q8H   hydrALAZINE  50 mg Oral Q8H   insulin aspart  0-9 Units Subcutaneous TID WC   losartan  50 mg Oral Daily   pantoprazole  40 mg Oral Daily   Continuous Infusions:   LOS: 2 days      Time spent: 35 minutes   Dessa Phi, DO [facility] 08/29/2021, 1:43 PM   Available via Epic secure chat 7am-7pm After these hours, please refer to coverage provider listed on [email or web address]

## 2021-08-29 NOTE — Progress Notes (Addendum)
[facility], [place] 08/29/21  Subjective:   LOS: 2  Anita Contreras is a 65 y.o. Jolly speaking female known to [place] from previous admissions This time she presents for cyanosis and unresponsiveness. Last dialysis treatment was on Saturday. In the ER noted to be bradycardic with HR in 20's. Given shifting measures in the ER- calcium gluconate, sodium bicarbonate, lokelma. Required cardiac pacing. Emergency dialysis the night of 9/19-9/20  Patient seen sitting at side of bed  Tolerating meals, denies nausea and vomiting Denies shortness of breath, [place] O2 2L  Potassium 4.8  Objective:  Vital signs in last 24 hours:  Temp:  [98.7 F (37.1 C)-101.4 F (38.6 C)] 99.3 F (37.4 C) (09/21 0835) Pulse Rate:  [52-78] 72 (09/21 0835) Resp:  [11-26] 18 (09/21 0835) BP: (106-159)/(45-70) 141/52 (09/21 0835) SpO2:  [94 %-100 %] 98 % (09/21 0835) Weight:  [49.3 kg] 49.3 kg (09/21 0500)  Weight change: 3.94 kg Filed Weights   08/27/21 2106 08/28/21 0101 08/29/21 0500  Weight: 45.4 kg 51.4 kg 49.3 kg    Intake/Output:    Intake/Output Summary (Last 24 hours) at 08/29/2021 1407 Last data filed at 08/29/2021 1054 Gross per 24 hour  Intake 1155.89 ml  Output --  Net 1155.89 ml     Physical Exam: General:  NAD, sitting at bedside  HEENT  anicteric, moist oral mucous membrane  Pulm/lungs  normal breathing effort, lungs are clear to auscultation, [place] O2  CVS/Heart  regular rhythm, no rub or gallop  Abdomen:   Soft, nontender  Extremities:  No peripheral edema  Neurologic: Alert, oriented, following commands  Skin:  No acute rashes  Right arm AV fistula, + thrill  Basic Metabolic Panel:  Recent Labs  Lab 08/27/21 2101 08/28/21 0243 08/28/21 0340 08/28/21 0525 08/28/21 0910 08/29/21 0415  NA 140 141  --   --  137 135  K 7.1* >7.5* 4.2 2.7* 4.0 4.8  CL 106 103  --   --  96* 95*  CO2 20* 29  --   --  30 31  GLUCOSE 362* 171*  --   --  82  104*  BUN 59* 66*  --   --  14 27*  CREATININE 6.47* 6.89*  --   --  2.32* 4.42*  CALCIUM 12.4* 10.2  --   --  8.9 7.8*  MG 2.5* 2.8*  --   --   --   --   PHOS  --  4.4  --   --   --  4.0      CBC: Recent Labs  Lab 08/27/21 2101 08/28/21 0243 08/29/21 0415  WBC 4.9 14.1* 6.3  NEUTROABS 4.1  --   --   HGB 8.7* 8.7* 7.8*  HCT 27.8* 26.5* 23.9*  MCV 110.3* 103.9* 103.0*  PLT 170 176 139*       Lab Results  Component Value Date   HEPBSAG NON REACTIVE 08/06/2021   HEPBSAB Reactive (A) 08/06/2021   HEPBIGM Negative 04/16/2016      Microbiology:  Recent Results (from the past 240 hour(s))  Resp Panel by RT-PCR (Flu A&B, Covid) Nasopharyngeal Swab     Status: None   Collection Time: 08/27/21  9:11 PM   Specimen: Nasopharyngeal Swab; Nasopharyngeal(NP) swabs in vial transport medium  Result Value Ref Range Status   SARS Coronavirus 2 by RT PCR NEGATIVE NEGATIVE Final    Comment: (NOTE) SARS-CoV-2 target nucleic acids are NOT DETECTED.  The SARS-CoV-2 RNA is generally  detectable in upper respiratory specimens during the acute phase of infection. The lowest concentration of SARS-CoV-2 viral copies this assay can detect is 138 copies/mL. A negative result does not preclude SARS-Cov-2 infection and should not be used as the sole basis for treatment or other patient management decisions. A negative result may occur with  improper specimen collection/handling, submission of specimen other than nasopharyngeal swab, presence of viral mutation(s) within the areas targeted by this assay, and inadequate number of viral copies(<138 copies/mL). A negative result must be combined with clinical observations, patient history, and epidemiological information. The expected result is Negative.  Fact Sheet for Patients:  EntrepreneurPulse.com.au  Fact Sheet for Healthcare Providers:  IncredibleEmployment.be  This test is no t yet approved or  cleared by the [place] FDA and  has been authorized for detection and/or diagnosis of SARS-CoV-2 by FDA under an Emergency Use Authorization (EUA). This EUA will remain  in effect (meaning this test can be used) for the duration of the COVID-19 declaration under Section 564(b)(1) of the Act, 21 U.S.C.section 360bbb-3(b)(1), unless the authorization is terminated  or revoked sooner.       Influenza A by PCR NEGATIVE NEGATIVE Final   Influenza B by PCR NEGATIVE NEGATIVE Final    Comment: (NOTE) The Xpert Xpress SARS-CoV-2/FLU/RSV plus assay is intended as an aid in the diagnosis of influenza from Nasopharyngeal swab specimens and should not be used as a sole basis for treatment. Nasal washings and aspirates are unacceptable for Xpert Xpress SARS-CoV-2/FLU/RSV testing.  Fact Sheet for Patients: EntrepreneurPulse.com.au  Fact Sheet for Healthcare Providers: IncredibleEmployment.be  This test is not yet approved or cleared by the [place] FDA and has been authorized for detection and/or diagnosis of SARS-CoV-2 by FDA under an Emergency Use Authorization (EUA). This EUA will remain in effect (meaning this test can be used) for the duration of the COVID-19 declaration under Section 564(b)(1) of the Act, 21 U.S.C. section 360bbb-3(b)(1), unless the authorization is terminated or revoked.  Performed at [facility], [address] [place], [place] [address]   MRSA Next Gen by PCR, Nasal     Status: None   Collection Time: 08/28/21  1:08 AM   Specimen: Nasal Mucosa; Nasal Swab  Result Value Ref Range Status   MRSA by PCR Next Gen NOT DETECTED NOT DETECTED Final    Comment: (NOTE) The GeneXpert MRSA Assay (FDA approved for NASAL specimens only), is one component of a comprehensive MRSA colonization surveillance program. It is not intended to diagnose MRSA infection nor to guide or monitor treatment for MRSA infections. Test  performance is not FDA approved in patients less than 39 years old. Performed at [facility], [address] [place], [place] [address]     Coagulation Studies: No results for input(s): LABPROT, INR in the last 72 hours.   Urinalysis: No results for input(s): COLORURINE, LABSPEC, PHURINE, GLUCOSEU, HGBUR, BILIRUBINUR, KETONESUR, PROTEINUR, UROBILINOGEN, NITRITE, LEUKOCYTESUR in the last 72 hours.  Invalid input(s): APPERANCEUR    Imaging: CT Angio Chest Pulmonary Embolism (PE) W or WO Contrast  Result Date: 08/28/2021 CLINICAL DATA:  PE suspected, low/intermediate prob, positive D-dimer, shortness of breath EXAM: CT ANGIOGRAPHY CHEST WITH CONTRAST TECHNIQUE: Multidetector CT imaging of the chest was performed using the standard protocol during bolus administration of intravenous contrast. Multiplanar CT image reconstructions and MIPs were obtained to evaluate the vascular anatomy. CONTRAST:  9m OMNIPAQUE IOHEXOL 350 MG/ML SOLN COMPARISON:  CT 02/10/2021, chest x-ray 08/27/2021 FINDINGS: Cardiovascular: Satisfactory opacification  of the pulmonary arteries to the segmental level. No evidence of pulmonary embolism. Slight respiratory motion degradation particularly at the lung bases. Thoracic aorta is nonaneurysmal. Scattered atherosclerotic calcifications of the aorta and coronary arteries. Heart size is mildly enlarged. No pericardial effusion. Mediastinum/Nodes: Overall decrease in size of multiple prominent mediastinal lymph nodes including 8 mm lower left paratracheal node and 6 mm AP window node. No axillary or hilar lymphadenopathy. Trachea and esophagus within normal limits. Subcentimeter right thyroid lobe nodule. Not clinically significant; no follow-up imaging recommended (ref: J Am Coll Radiol. 2015 Feb;12(2): 143-50). Lungs/Pleura: Trace bilateral pleural effusions, right greater than left with associated compressive atelectasis. Lungs are otherwise clear. No pneumothorax.  Upper Abdomen: Reflux of contrast into the IVC and hepatic veins. No acute findings are seen within the included upper abdomen. Musculoskeletal: No chest wall abnormality. No acute or significant osseous findings. Review of the MIP images confirms the above findings. IMPRESSION: 1. Negative for pulmonary embolism. 2. Trace bilateral pleural effusions, right greater than left, with associated compressive atelectasis. 3. Overall decrease in size of multiple prominent mediastinal lymph nodes, which were likely reactive. 4. Reflux of contrast into the IVC and hepatic veins, suggesting right heart dysfunction. Aortic Atherosclerosis (ICD10-I70.0). Electronically Signed   By: Davina Poke D.O.   On: 08/28/2021 18:27   [place] Dialysis Access  Result Date: 08/28/2021 CLINICAL DATA:  Right brachiocephalic fistula. EXAM: ULTRASOUND DIALYSIS ACCESS TECHNIQUE: Sonographic grayscale and duplex with spectral analysis and color-flow of right brachiocephalic fistula was performed. COMPARISON:  None. FINDINGS: Right brachiocephalic fistula is patent. Long segment stent is noted. Velocities throughout the stent range between 105 and 169 centimeters/second. No evidence of significant stenosis within the stent. Mildly elevated velocity noted in the brachial artery just proximal to the anastomosis measuring 333 millimeters/second. Normal velocities noted within the Peri anastomotic venous outflow measuring 169 centimeters/second. IMPRESSION: Patent right brachiocephalic fistula without evidence of significant stenosis. 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 Electronically Signed   By: Miachel Roux M.D.   On: 08/28/2021 10:50   DG Chest Portable 1 View  Result Date: 08/27/2021 CLINICAL DATA:  Bradycardia and unresponsiveness EXAM: PORTABLE CHEST 1 VIEW COMPARISON:  02/10/2021 FINDINGS: Cardiac shadow is enlarged but stable. Lungs are well aerated bilaterally. No focal infiltrate or sizable  effusion is seen. Stenting is noted in the right shoulder stable from the prior exam. No sizable effusion is noted. No bony abnormality is seen. IMPRESSION: No acute abnormality noted. Electronically Signed   By: Inez Catalina M.D.   On: 08/27/2021 21:16     Medications:      amLODipine  10 mg Oral Daily   [START ON 08/30/2021] epoetin (EPOGEN/PROCRIT) injection  10,000 Units Intravenous Q T,Th,Sa-HD   heparin  5,000 Units Subcutaneous Q8H   hydrALAZINE  50 mg Oral Q8H   insulin aspart  0-9 Units Subcutaneous TID WC   losartan  50 mg Oral Daily   pantoprazole  40 mg Oral Daily   docusate sodium, heparin, ondansetron (ZOFRAN) IV, polyethylene glycol  Assessment/ Plan:  65 y.o. female with end-stage renal disease (4 to 5 years), diabetes type 2, hyperlipidemia, history of stroke, diabetic retinopathy was admitted for  Hyperkalemia [E87.5] Bradycardia [R00.1] ESRD (end stage renal disease) ([place]) [N18.6] Altered mental status, unspecified altered mental status type [R41.82] Hyperkalemia  [facility] nephrology/TTS/[place]/right arm AV fistula  #.  Hyperkalemia with end stage renal disease on dialysis Lab Results  Component Value Date   K 4.8  08/29/2021    Last dialysis treatment on Saturday.  Multiple recent admissions for hyperkalemia Had angioplasty of access on 9/1 Daughter at bedside reports that patient did not miss her treatment Now with hyperkalemia causing bradycardia and peaked T waves Consent for emergent HD obtained from the daughter  Potassium in normal range, [place] of dialysis access negative for stenosis but small pseudoaneurysm noted. Next treatment scheduled for Thursday.   # Secondary hyperparathyroidism of renal origin N 25.81  Monitor calcium and  phos during admission Lab Results  Component Value Date   PTH 183 (H) 11/09/2016   CALCIUM 7.8 (L) 08/29/2021   PHOS 4.0 08/29/2021  Phosphorus at goal   # anemia of CKD  Lab Results  Component Value Date   HGB 7.8  (L) 08/29/2021     EPO with HD  #Hypertension Blood pressure 141/52 Currently on amlodipine, hydralazine, and losartan   LOS: 2 Kumiko Fishman 9/21/20222:07 PM

## 2021-08-29 NOTE — Progress Notes (Addendum)
Spanish interpreter utilized [employer] # [id number] for the pt. Pt was educated on the medicine and its importance and asked if pt have any concerns of questions that needs to be addressed but pt have not concerns or question at this time. Will continue to monitor.

## 2021-08-29 NOTE — TOC Initial Note (Signed)
Transition of Care [facility]) - Initial/Assessment Note    Patient Details  Name: Anita Contreras MRN: [id number] Date of Birth: [birth year]  Transition of Care [facility]) CM/SW Contact:    Beverly Sessions, RN Phone Number: 08/29/2021, 3:12 PM  Clinical Narrative:                 Patient with high risk for readmission score Assessed by [facility] 8/31 See note below "Patient admitted for COVID 19, history of ESRD on hemodialysis T,Th,S.  RNCM was able to speak with the patient's daughter, [name], via phone.  [name] reports that the patient lives in [place] with daughter, [name].  [name] reports that she lives close by and visits her mother everyday.  Patient is independent in ADL's, but if she is not feeling well her daughters help her out.  Patient goes to dialysis in [place] and has transportation provided on T and Th and the family provides transportation on Sat.  Patient is current at [facility] and also gets prescriptions from there.  Patient is currently on [place] 2L.  [name] with [facility] is aware of admission. "    [name] dialysis liaison notified of this admission   Expected Discharge Plan: [place]     Patient Goals and CMS Choice        Expected Discharge Plan and Services Expected Discharge Plan: [place]                                              Prior Living Arrangements/Services   Lives with:: Relatives Patient language and need for interpreter reviewed:: Yes        Need for Family Participation in Patient Care: Yes (Comment) Care giver support system in place?: Yes (comment)   Criminal Activity/Legal Involvement Pertinent to Current Situation/Hospitalization: No - Comment as needed  Activities of Daily Living Home Assistive Devices/Equipment: None ADL Screening (condition at time of admission) Patient's cognitive ability adequate to safely complete daily activities?: Yes Is the  patient deaf or have difficulty hearing?: No Does the patient have difficulty seeing, even when wearing glasses/contacts?: Yes (blind in L eye) Does the patient have difficulty concentrating, remembering, or making decisions?: No Patient able to express need for assistance with ADLs?: Yes Does the patient have difficulty dressing or bathing?: No Independently performs ADLs?: Yes (appropriate for developmental age) Does the patient have difficulty walking or climbing stairs?: Yes Weakness of Legs: Both Weakness of Arms/Hands: None  Permission Sought/Granted                  Emotional Assessment       Orientation: : Oriented to Self, Oriented to Place, Oriented to  Time, Oriented to Situation Alcohol / Substance Use: Not Applicable Psych Involvement: No (comment)  Admission diagnosis:  Hyperkalemia [E87.5] Bradycardia [R00.1] ESRD (end stage renal disease) ([place]) [N18.6] Altered mental status, unspecified altered mental status type [R41.82] Patient Active Problem List   Diagnosis Date Noted   Altered mental status    Acute respiratory failure with hypoxia ([place]) 02/10/2021   GERD (gastroesophageal reflux disease) 02/10/2021   Respiratory failure with hypoxia ([place]) 02/10/2021   Weakness    Acute respiratory failure due to COVID-19 ([place]) 08/07/2020   Type II diabetes mellitus with ophthalmic manifestations, uncontrolled ([place])    Abnormality of albumin 01/20/2020  Mild protein-calorie malnutrition ([place]) 02/28/2019   Dependence on renal dialysis ([place]) 02/05/2018   HTN (hypertension) 01/26/2018   Headache, unspecified 01/22/2018   HCAP (healthcare-associated pneumonia) 12/23/2017   Fever, unspecified 12/23/2017   Bradycardia 01/17/2017   Syncope 01/17/2017   Nausea and vomiting 01/17/2017   Lactic acidosis 01/17/2017   Leukocytosis 01/17/2017   Hyperkalemia 01/14/2017   Other chronic pain 12/14/2016   Steal syndrome as complication of dialysis access ([place]) 11/08/2016    Pain in left arm 10/23/2016   Renal dialysis device, implant, or graft complication [id number]   Hyperlipidemia 10/15/2016   Essential hypertension 10/15/2016   Type 2 diabetes mellitus with ESRD (end-stage renal disease) ([place]) 10/15/2016   Disorder of phosphorus metabolism, unspecified 07/01/2016   Encounter for immunization 06/05/2016   Other disorders of plasma-protein metabolism, not elsewhere classified 06/01/2016   Anemia in chronic kidney disease 05/12/2016   Iron deficiency anemia, unspecified 05/12/2016   ESRD (end stage renal disease) ([place]) 05/06/2016   Coagulation defect, unspecified ([place]) 05/06/2016   Heart failure, unspecified ([place]) 05/06/2016   Shortness of breath 05/06/2016   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 ([place])    Acute right flank pain    Acute cystitis 05/26/2015   Neuropathy 05/26/2015   PCP:  Frazier Richards, MD Pharmacy:   [facility], [place] [address] Phone: [phone] Fax: [phone]  [facility] [address], [place] [address] Phone: [phone] Fax: [phone]  [id number] [id number]: [phone]: [phone]    Social Determinants of Health (SDOH) Interventions    Readmission Risk Interventions Readmission Risk Prevention Plan 08/29/2021 08/08/2020  Transportation Screening Complete Complete  PCP or Specialist Appt within 3-5 Days Complete Complete  HRI or [place]- Complete  Social Work Consult for RDel AirePlanning/Counseling - Complete  Palliative Care Screening Not Applicable Not Applicable  Medication Review (RN Care Manager) Complete Complete  Some recent data might be hidden

## 2021-08-30 LAB — CBC
HCT: 23.6 % — ABNORMAL LOW (ref 36.0–46.0)
Hemoglobin: 8 g/dL — ABNORMAL LOW (ref 12.0–15.0)
MCH: 34.6 pg — ABNORMAL HIGH (ref 26.0–34.0)
MCHC: 33.9 g/dL (ref 30.0–36.0)
MCV: 102.2 fL — ABNORMAL HIGH (ref 80.0–100.0)
Platelets: 138 10*3/uL — ABNORMAL LOW (ref 150–400)
RBC: 2.31 MIL/uL — ABNORMAL LOW (ref 3.87–5.11)
RDW: 13.6 % (ref 11.5–15.5)
WBC: 5 10*3/uL (ref 4.0–10.5)
nRBC: 0 % (ref 0.0–0.2)

## 2021-08-30 LAB — BASIC METABOLIC PANEL
Anion gap: 13 (ref 5–15)
BUN: 43 mg/dL — ABNORMAL HIGH (ref 8–23)
CO2: 27 mmol/L (ref 22–32)
Calcium: 7.7 mg/dL — ABNORMAL LOW (ref 8.9–10.3)
Chloride: 95 mmol/L — ABNORMAL LOW (ref 98–111)
Creatinine, Ser: 6.82 mg/dL — ABNORMAL HIGH (ref 0.44–1.00)
GFR, Estimated: 6 mL/min — ABNORMAL LOW (ref >60)
Glucose, Bld: 98 mg/dL (ref 70–99)
Potassium: 5.4 mmol/L — ABNORMAL HIGH (ref 3.5–5.1)
Sodium: 135 mmol/L (ref 135–145)

## 2021-08-30 LAB — GLUCOSE, CAPILLARY
Glucose-Capillary: 83 mg/dL (ref 70–99)
Glucose-Capillary: 200 mg/dL — ABNORMAL HIGH (ref 70–99)
Glucose-Capillary: 52 mg/dL — ABNORMAL LOW (ref 70–99)
Glucose-Capillary: 107 mg/dL — ABNORMAL HIGH (ref 70–99)

## 2021-08-30 MED ORDER — HYDRALAZINE HCL 10 MG PO TABS
10.0000 mg | ORAL_TABLET | Freq: Four times a day (QID) | ORAL | Status: DC | PRN
  Administered 2021-08-30: 10 mg via ORAL
  Filled 2021-08-30 (×2): qty 1

## 2021-08-30 NOTE — Progress Notes (Signed)
PROGRESS NOTE    Anita Contreras  [id number] DOB: 13-Jun-1956 DOA: 08/27/2021 PCP: Frazier Richards, MD     Brief Narrative:  Anita Contreras is a 65 year old female with significant history of ESRD on HD, presented to [facility] ED on 08/27/2021 from home via EMS due to loss of consciousness.  Patient described, with Spanish interpreter assistance, being in her normal state of health until around 4 PM.  She reports feeling extremely fatigued, nauseous and then her vision " went black" and she vomited what she believes was nonbloody bilious emesis.  The patient had no knowledge of transporting to the hospital.  Patient and daughter confirmed that she did not fall to the ground with this syncopal episode.  Both confirmed her last dialysis session was on Saturday as per her normal Tuesday Thursday Saturday schedule.  Patient reports making herself tomato salsa with her morning egg today, otherwise no changes to her diet.  She denies chest pain, dyspnea, no current complaints of blurred vision/dizziness/headache/nausea. Per ED documentation EMS staff reported the patient was found unresponsive with heart rate in the 20s.  They started transcutaneously pacing, administered 2 mg of Versed, calcium and sodium bicarbonate in the field due to concerns for potential hyperkalemia. Per ED documentation upon arrival patient was altered with low heart rates in the 20s to 30s.  She received an additional dose of calcium & sodium bicarbonate with the addition of 10 units of insulin.  CBG was in the 400s so D50 was held.  EDP paused TC pacing, but then had to restart within 2 to 3 minutes as the heart rate dropped back down into the 20s.  Cardiologist Dr. Fletcher Anon was consulted due to potential need for temporary pacing wires.  Dr. Fletcher Anon recommended initiation of dopamine drip and verifying potassium level prior to any cardiology intervention. After these interventions patient became more alert with heart rate in  the 70s and normal blood pressure.  Dr. Candiss Norse was consulted, who recommended [facility] and emergent dialysis.  PCCM was consulted for admission.  After emergent dialysis, potassium level normalized and bradycardia resolved.  New events last 24 hours / Subjective: No acute events reported overnight.  Patient without any new complaints.  Disappointed to hear that she is not going home today.  Afebrile last 24 hours.  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  Acute hypoxemic respiratory failure -CTA chest negative for PE, no focal consolidation seen -Wean oxygen to room air  Diabetes mellitus type 2 with hyperglycemia -Continue sliding scale insulin  Hypertension -Continue Norvasc, hydralazine -Holding Cozaar due to mild hyperkalemia today   DVT prophylaxis:  heparin injection 5,000 Units Start: 08/27/21 2245 SCDs Start: 08/27/21 2230  Code Status:  Code Status History     Date Active Date Inactive Code Status Order ID Comments User Context   08/27/2021 2232 08/27/2021 2308 Full Code [id number]  Rust-Chester, Huel Cote, NP ED   08/06/2021 2025 08/07/2021 1859 Full Code [id number]  Rust-Chester, Huel Cote, NP ED   02/10/2021 [phone]  02/12/2021 0032 Full Code [id number]  Para Skeans, MD ED   08/07/2020 1210 08/11/2020 2311 Full Code [id number]  Vashti Hey, MD ED   02/24/2018 1247 02/25/2018 1737 Full Code [id number]  Dustin Flock, MD  Inpatient   01/26/2018 1533 01/27/2018 2212 Full Code [id number]  Bettey Costa, MD Inpatient   12/24/2017 0131 12/25/2017 2058 Full Code [id number]  Lance Coon, MD Inpatient   01/14/2017 0925 01/17/2017 1803 Full Code [id number]  Laverle Hobby, MD ED   11/08/2016 1808 11/10/2016 1754 Full Code [id number]  Algernon Huxley, MD Inpatient   10/17/2016 1319 10/17/2016 1924 Full Code [id number]  Algernon Huxley, MD Inpatient   04/23/2016 2029 05/07/2016 2001 Full Code [id number]  Vaughan Basta, MD Inpatient   04/15/2016 1918 04/19/2016 1608 Full Code [id number]  Gladstone Lighter, MD Inpatient      Questions for Most Recent Historical Code Status (Order [id number])       Family Communication: No family at bedside Disposition Plan:  Status is: Inpatient  Remains inpatient appropriate because:IV treatments appropriate due to intensity of illness or inability to take PO  Dispo: The patient is from: Home              Anticipated d/c is to: Home              Patient currently is not medically stable to d/c.   Difficult to place patient No    Antimicrobials:  Anti-infectives (From admission, onward)    None        Objective: Vitals:   08/30/21 1030 08/30/21 1045 08/30/21 1100 08/30/21 1115  BP: (!) 182/51 (!) 191/52 (!) 183/64 (!) 196/51  Pulse: 67 69 67 71  Resp: '18 18 18 18  '$ Temp:      TempSrc:      SpO2:      Weight:      Height:        Intake/Output Summary (Last 24 hours) at 08/30/2021 1306 Last data filed at 08/29/2021 1846 Gross per 24 hour  Intake 240 ml  Output --  Net 240 ml    Filed Weights   08/28/21 0101 08/29/21 0500 08/30/21 0544  Weight: 51.4 kg 49.3 kg 50.5 kg    Examination: General exam: Appears calm and comfortable  Respiratory system: Clear to auscultation. Respiratory effort normal. Cardiovascular system: S1 & S2 heard, RRR. No pedal edema. Gastrointestinal system: Abdomen is nondistended, soft and nontender. Normal bowel sounds heard. Central nervous  system: Alert. Non focal exam. Speech clear  Extremities: Symmetric in appearance bilaterally  Skin: No rashes, lesions or ulcers on exposed skin  Psychiatry: Mood & affect appropriate.   Data Reviewed: I have personally reviewed following labs and imaging studies  CBC: Recent Labs  Lab 08/27/21 2101 08/28/21 0243 08/29/21 0415 08/30/21 0551  WBC 4.9 14.1* 6.3 5.0  NEUTROABS 4.1  --   --   --   HGB 8.7* 8.7* 7.8* 8.0*  HCT 27.8* 26.5* 23.9* 23.6*  MCV 110.3* 103.9* 103.0* 102.2*  PLT 170 176 139* 138*    Basic Metabolic Panel: Recent Labs  Lab 08/27/21 2101 08/28/21 0243 08/28/21 0340 08/28/21 0525 08/28/21 0910 08/29/21 0415 08/30/21 0551  NA 140 141  --   --  137 135 135  K 7.1* >7.5* 4.2 2.7* 4.0 4.8 5.4*  CL 106 103  --   --  96* 95* 95*  CO2 20* 29  --   --  '30 31 27  '$ GLUCOSE 362* 171*  --   --  82 104* 98  BUN 59* 66*  --   --  14 27* 43*  CREATININE 6.47* 6.89*  --   --  2.32* 4.42* 6.82*  CALCIUM 12.4* 10.2  --   --  8.9 7.8* 7.7*  MG 2.5* 2.8*  --   --   --   --   --   PHOS  --  4.4  --   --   --  4.0  --     GFR: Estimated Creatinine Clearance: 6.5 mL/min (A) (by C-G formula based on SCr of 6.82 mg/dL (H)). Liver Function Tests: Recent Labs  Lab 08/27/21 2101 08/29/21 0415  AST 270*  --   ALT 125*  --   ALKPHOS 137*  --   BILITOT 0.9  --   PROT 5.8*  --   ALBUMIN 3.1* 3.0*    No results for input(s): LIPASE, AMYLASE in the last 168 hours. No results for input(s): AMMONIA in the last 168 hours. Coagulation Profile: No results for input(s): INR, PROTIME in the last 168 hours. Cardiac Enzymes: No results for input(s): CKTOTAL, CKMB, CKMBINDEX, TROPONINI in the last 168 hours. BNP (last 3 results) No results for input(s): PROBNP in the last 8760 hours. HbA1C: No results for input(s): HGBA1C in the last 72 hours. CBG: Recent Labs  Lab 08/29/21 0407 08/29/21 0831 08/29/21 1136 08/29/21 1547 08/29/21 2152  GLUCAP 118* 118* 141* 129* 147*     Lipid Profile: No results for input(s): CHOL, HDL, LDLCALC, TRIG, CHOLHDL, LDLDIRECT in the last 72 hours. Thyroid Function Tests: No results for input(s): TSH, T4TOTAL, FREET4, T3FREE, THYROIDAB in the last 72 hours. Anemia Panel: No results for input(s): VITAMINB12, FOLATE, FERRITIN, TIBC, IRON, RETICCTPCT in the last 72 hours. Sepsis Labs: No results for input(s): PROCALCITON, LATICACIDVEN in the last 168 hours.  Recent Results (from the past 240 hour(s))  Resp Panel by RT-PCR (Flu A&B, Covid) Nasopharyngeal Swab     Status: None   Collection Time: 08/27/21  9:11 PM   Specimen: Nasopharyngeal Swab; Nasopharyngeal(NP) swabs in vial transport medium  Result Value Ref Range Status   SARS Coronavirus 2 by RT PCR NEGATIVE NEGATIVE Final    Comment: (NOTE) SARS-CoV-2 target nucleic acids are NOT DETECTED.  The SARS-CoV-2 RNA is generally detectable in upper respiratory specimens during the acute phase of infection. The lowest concentration of SARS-CoV-2 viral copies this assay can detect is 138 copies/mL. A negative result does not preclude SARS-Cov-2 infection and should not be used as the sole basis for treatment or other patient management decisions. A negative result may occur with  improper specimen collection/handling, submission of specimen other than nasopharyngeal swab, presence of viral mutation(s) within the areas targeted by this assay, and inadequate number of viral copies(<138 copies/mL). A negative result must be combined with clinical observations, patient history, and epidemiological information. The expected result is Negative.  Fact Sheet for Patients:  EntrepreneurPulse.com.au  Fact Sheet for Healthcare Providers:  IncredibleEmployment.be  This test is no t yet approved or cleared by the [place] FDA and  has been authorized for detection and/or diagnosis of SARS-CoV-2 by FDA under an Emergency Use Authorization  (EUA). This EUA will remain  in effect (meaning this test can be used) for the duration of the COVID-19 declaration under Section 564(b)(1) of the Act, 21 U.S.C.section 360bbb-3(b)(1), unless the authorization is terminated  or revoked sooner.       Influenza A by PCR NEGATIVE NEGATIVE Final   Influenza B by PCR  NEGATIVE NEGATIVE Final    Comment: (NOTE) The Xpert Xpress SARS-CoV-2/FLU/RSV plus assay is intended as an aid in the diagnosis of influenza from Nasopharyngeal swab specimens and should not be used as a sole basis for treatment. Nasal washings and aspirates are unacceptable for Xpert Xpress SARS-CoV-2/FLU/RSV testing.  Fact Sheet for Patients: EntrepreneurPulse.com.au  Fact Sheet for Healthcare Providers: IncredibleEmployment.be  This test is not yet approved or cleared by the [place] FDA and has been authorized for detection and/or diagnosis of SARS-CoV-2 by FDA under an Emergency Use Authorization (EUA). This EUA will remain in effect (meaning this test can be used) for the duration of the COVID-19 declaration under Section 564(b)(1) of the Act, 21 U.S.C. section 360bbb-3(b)(1), unless the authorization is terminated or revoked.  Performed at [facility], [address] [place], [place] [address]   MRSA Next Gen by PCR, Nasal     Status: None   Collection Time: 08/28/21  1:08 AM   Specimen: Nasal Mucosa; Nasal Swab  Result Value Ref Range Status   MRSA by PCR Next Gen NOT DETECTED NOT DETECTED Final    Comment: (NOTE) The GeneXpert MRSA Assay (FDA approved for NASAL specimens only), is one component of a comprehensive MRSA colonization surveillance program. It is not intended to diagnose MRSA infection nor to guide or monitor treatment for MRSA infections. Test performance is not FDA approved in patients less than 107 years old. Performed at [facility], [address] [place], [place]  [id number]   CULTURE, BLOOD (ROUTINE X 2) w Reflex to ID Panel     Status: None (Preliminary result)   Collection Time: 08/29/21 10:27 AM   Specimen: BLOOD  Result Value Ref Range Status   Specimen Description BLOOD  [facility]  Final   Special Requests   Final    BOTTLES DRAWN AEROBIC AND ANAEROBIC Blood Culture adequate volume   Culture   Final    NO GROWTH < 24 HOURS Performed at [facility], [address], [place], [place] [address]    Report Status PENDING  Incomplete  CULTURE, BLOOD (ROUTINE X 2) w Reflex to ID Panel     Status: None (Preliminary result)   Collection Time: 08/29/21 10:27 AM   Specimen: BLOOD  Result Value Ref Range Status   Specimen Description BLOOD  [facility]  Final   Special Requests   Final    BOTTLES DRAWN AEROBIC AND ANAEROBIC Blood Culture adequate volume   Culture   Final    NO GROWTH < 24 HOURS Performed at [facility], [address] [place], [place] [address]    Report Status PENDING  Incomplete       Radiology Studies: CT Angio Chest Pulmonary Embolism (PE) W or WO Contrast  Result Date: 08/28/2021 CLINICAL DATA:  PE suspected, low/intermediate prob, positive D-dimer, shortness of breath EXAM: CT ANGIOGRAPHY CHEST WITH CONTRAST TECHNIQUE: Multidetector CT imaging of the chest was performed using the standard protocol during bolus administration of intravenous contrast. Multiplanar CT image reconstructions and MIPs were obtained to evaluate the vascular anatomy. CONTRAST:  37m OMNIPAQUE IOHEXOL 350 MG/ML SOLN COMPARISON:  CT 02/10/2021, chest x-ray 08/27/2021 FINDINGS: Cardiovascular: Satisfactory opacification of the pulmonary arteries to the segmental level. No evidence of pulmonary embolism. Slight respiratory motion degradation particularly at the lung bases. Thoracic aorta is nonaneurysmal. Scattered atherosclerotic calcifications of the aorta and coronary arteries. Heart size is mildly enlarged. No pericardial effusion.  Mediastinum/Nodes: Overall decrease in size of multiple prominent mediastinal  lymph nodes including 8 mm lower left paratracheal node and 6 mm AP window node. No axillary or hilar lymphadenopathy. Trachea and esophagus within normal limits. Subcentimeter right thyroid lobe nodule. Not clinically significant; no follow-up imaging recommended (ref: J Am Coll Radiol. 2015 Feb;12(2): 143-50). Lungs/Pleura: Trace bilateral pleural effusions, right greater than left with associated compressive atelectasis. Lungs are otherwise clear. No pneumothorax. Upper Abdomen: Reflux of contrast into the IVC and hepatic veins. No acute findings are seen within the included upper abdomen. Musculoskeletal: No chest wall abnormality. No acute or significant osseous findings. Review of the MIP images confirms the above findings. IMPRESSION: 1. Negative for pulmonary embolism. 2. Trace bilateral pleural effusions, right greater than left, with associated compressive atelectasis. 3. Overall decrease in size of multiple prominent mediastinal lymph nodes, which were likely reactive. 4. Reflux of contrast into the IVC and hepatic veins, suggesting right heart dysfunction. Aortic Atherosclerosis (ICD10-I70.0). Electronically Signed   By: Davina Poke D.O.   On: 08/28/2021 18:27      Scheduled Meds:  amLODipine  10 mg Oral Daily   epoetin (EPOGEN/PROCRIT) injection  10,000 Units Intravenous Q T,Th,Sa-HD   heparin  5,000 Units Subcutaneous Q8H   hydrALAZINE  50 mg Oral Q8H   insulin aspart  0-9 Units Subcutaneous TID WC   pantoprazole  40 mg Oral Daily   Continuous Infusions:   LOS: 3 days      Time spent: 20 minutes   Dessa Phi, DO [facility] 08/30/2021, 1:06 PM   Available via Epic secure chat 7am-7pm After these hours, please refer to coverage provider listed on [email or web address]

## 2021-08-30 NOTE — Progress Notes (Signed)
Spoke with Shae in central telemetry at [phone] to transfer patient from room 228 to [place]

## 2021-08-30 NOTE — Progress Notes (Addendum)
[place], [place] 08/30/21  Subjective:   LOS: 3  Anita Contreras is a 64 y.o. [place] speaking female known to [place] from previous admissions This time she presents for cyanosis and unresponsiveness. Last dialysis treatment was on Saturday. In the ER noted to be bradycardic with HR in 20's. Given shifting measures in the ER- calcium gluconate, sodium bicarbonate, lokelma. Required cardiac pacing. Emergency dialysis the night of 9/19-9/20  Patient seen and evaluated during dialysis   HEMODIALYSIS FLOWSHEET:  Blood Flow Rate (mL/min): 400 mL/min Arterial Pressure (mmHg): -140 mmHg Venous Pressure (mmHg): 110 mmHg Transmembrane Pressure (mmHg): 70 mmHg Ultrafiltration Rate (mL/min): 170 mL/min Dialysate Flow Rate (mL/min): 500 ml/min Conductivity: Machine : 13.8 Conductivity: Machine : 13.8 Dialysis Fluid Bolus: Normal Saline Bolus Amount (mL): 250 mL  No complaints at this time Resting comfortably  Potassium 5.4  Objective:  Vital signs in last 24 hours:  Temp:  [98.4 F (36.9 C)-99.8 F (37.7 C)] 99.4 F (37.4 C) (09/22 0913) Pulse Rate:  [67-75] 71 (09/22 1115) Resp:  [12-18] 18 (09/22 1115) BP: (127-196)/(45-64) 196/51 (09/22 1115) SpO2:  [100 %] 100 % (09/22 0913) Weight:  [50.5 kg] 50.5 kg (09/22 0544)  Weight change: 1.2 kg Filed Weights   08/28/21 0101 08/29/21 0500 08/30/21 0544  Weight: 51.4 kg 49.3 kg 50.5 kg    Intake/Output:    Intake/Output Summary (Last 24 hours) at 08/30/2021 1204 Last data filed at 08/29/2021 1846 Gross per 24 hour  Intake 240 ml  Output --  Net 240 ml     Physical Exam: General:  NAD, laying in bed  HEENT  anicteric, moist oral mucous membrane  Pulm/lungs  normal breathing effort, lungs are clear to auscultation, [place] O2  CVS/Heart  regular rhythm, no rub or gallop  Abdomen:   Soft, nontender  Extremities:  No peripheral edema  Neurologic: Alert, oriented, following commands  Skin:   No acute rashes  Right arm AV fistula, + thrill  Basic Metabolic Panel:  Recent Labs  Lab 08/27/21 2101 08/28/21 0243 08/28/21 0340 08/28/21 0525 08/28/21 0910 08/29/21 0415 08/30/21 0551  NA 140 141  --   --  137 135 135  K 7.1* >7.5* 4.2 2.7* 4.0 4.8 5.4*  CL 106 103  --   --  96* 95* 95*  CO2 20* 29  --   --  '30 31 27  '$ GLUCOSE 362* 171*  --   --  82 104* 98  BUN 59* 66*  --   --  14 27* 43*  CREATININE 6.47* 6.89*  --   --  2.32* 4.42* 6.82*  CALCIUM 12.4* 10.2  --   --  8.9 7.8* 7.7*  MG 2.5* 2.8*  --   --   --   --   --   PHOS  --  4.4  --   --   --  4.0  --       CBC: Recent Labs  Lab 08/27/21 2101 08/28/21 0243 08/29/21 0415 08/30/21 0551  WBC 4.9 14.1* 6.3 5.0  NEUTROABS 4.1  --   --   --   HGB 8.7* 8.7* 7.8* 8.0*  HCT 27.8* 26.5* 23.9* 23.6*  MCV 110.3* 103.9* 103.0* 102.2*  PLT 170 176 139* 138*       Lab Results  Component Value Date   HEPBSAG NON REACTIVE 08/06/2021   HEPBSAB Reactive (A) 08/06/2021   HEPBIGM Negative 04/16/2016      Microbiology:  Recent Results (from the  past 240 hour(s))  Resp Panel by RT-PCR (Flu A&B, Covid) Nasopharyngeal Swab     Status: None   Collection Time: 08/27/21  9:11 PM   Specimen: Nasopharyngeal Swab; Nasopharyngeal(NP) swabs in vial transport medium  Result Value Ref Range Status   SARS Coronavirus 2 by RT PCR NEGATIVE NEGATIVE Final    Comment: (NOTE) SARS-CoV-2 target nucleic acids are NOT DETECTED.  The SARS-CoV-2 RNA is generally detectable in upper respiratory specimens during the acute phase of infection. The lowest concentration of SARS-CoV-2 viral copies this assay can detect is 138 copies/mL. A negative result does not preclude SARS-Cov-2 infection and should not be used as the sole basis for treatment or other patient management decisions. A negative result may occur with  improper specimen collection/handling, submission of specimen other than nasopharyngeal swab, presence of viral  mutation(s) within the areas targeted by this assay, and inadequate number of viral copies(<138 copies/mL). A negative result must be combined with clinical observations, patient history, and epidemiological information. The expected result is Negative.  Fact Sheet for Patients:  EntrepreneurPulse.com.au  Fact Sheet for Healthcare Providers:  IncredibleEmployment.be  This test is no t yet approved or cleared by the [place] FDA and  has been authorized for detection and/or diagnosis of SARS-CoV-2 by FDA under an Emergency Use Authorization (EUA). This EUA will remain  in effect (meaning this test can be used) for the duration of the COVID-19 declaration under Section 564(b)(1) of the Act, 21 U.S.C.section 360bbb-3(b)(1), unless the authorization is terminated  or revoked sooner.       Influenza A by PCR NEGATIVE NEGATIVE Final   Influenza B by PCR NEGATIVE NEGATIVE Final    Comment: (NOTE) The Xpert Xpress SARS-CoV-2/FLU/RSV plus assay is intended as an aid in the diagnosis of influenza from Nasopharyngeal swab specimens and should not be used as a sole basis for treatment. Nasal washings and aspirates are unacceptable for Xpert Xpress SARS-CoV-2/FLU/RSV testing.  Fact Sheet for Patients: EntrepreneurPulse.com.au  Fact Sheet for Healthcare Providers: IncredibleEmployment.be  This test is not yet approved or cleared by the [place] FDA and has been authorized for detection and/or diagnosis of SARS-CoV-2 by FDA under an Emergency Use Authorization (EUA). This EUA will remain in effect (meaning this test can be used) for the duration of the COVID-19 declaration under Section 564(b)(1) of the Act, 21 U.S.C. section 360bbb-3(b)(1), unless the authorization is terminated or revoked.  Performed at [facility], [address] [place], [place] [address]   MRSA Next Gen by PCR, Nasal      Status: None   Collection Time: 08/28/21  1:08 AM   Specimen: Nasal Mucosa; Nasal Swab  Result Value Ref Range Status   MRSA by PCR Next Gen NOT DETECTED NOT DETECTED Final    Comment: (NOTE) The GeneXpert MRSA Assay (FDA approved for NASAL specimens only), is one component of a comprehensive MRSA colonization surveillance program. It is not intended to diagnose MRSA infection nor to guide or monitor treatment for MRSA infections. Test performance is not FDA approved in patients less than 66 years old. Performed at [facility], [address] [place], [place] [address]   CULTURE, BLOOD (ROUTINE X 2) w Reflex to ID Panel     Status: None (Preliminary result)   Collection Time: 08/29/21 10:27 AM   Specimen: BLOOD  Result Value Ref Range Status   Specimen Description BLOOD  [facility]  Final   Special Requests   Final    BOTTLES DRAWN  AEROBIC AND ANAEROBIC Blood Culture adequate volume   Culture   Final    NO GROWTH < 24 HOURS Performed at [facility], [address] [place], [place] [address]    Report Status PENDING  Incomplete  CULTURE, BLOOD (ROUTINE X 2) w Reflex to ID Panel     Status: None (Preliminary result)   Collection Time: 08/29/21 10:27 AM   Specimen: BLOOD  Result Value Ref Range Status   Specimen Description BLOOD  LAC  Final   Special Requests   Final    BOTTLES DRAWN AEROBIC AND ANAEROBIC Blood Culture adequate volume   Culture   Final    NO GROWTH < 24 HOURS Performed at [facility], [address] [place], [place] [address]    Report Status PENDING  Incomplete    Coagulation Studies: No results for input(s): LABPROT, INR in the last 72 hours.   Urinalysis: No results for input(s): COLORURINE, LABSPEC, PHURINE, GLUCOSEU, HGBUR, BILIRUBINUR, KETONESUR, PROTEINUR, UROBILINOGEN, NITRITE, LEUKOCYTESUR in the last 72 hours.  Invalid input(s): APPERANCEUR    Imaging: CT Angio Chest Pulmonary Embolism (PE) W or WO  Contrast  Result Date: 08/28/2021 CLINICAL DATA:  PE suspected, low/intermediate prob, positive D-dimer, shortness of breath EXAM: CT ANGIOGRAPHY CHEST WITH CONTRAST TECHNIQUE: Multidetector CT imaging of the chest was performed using the standard protocol during bolus administration of intravenous contrast. Multiplanar CT image reconstructions and MIPs were obtained to evaluate the vascular anatomy. CONTRAST:  71m OMNIPAQUE IOHEXOL 350 MG/ML SOLN COMPARISON:  CT 02/10/2021, chest x-ray 08/27/2021 FINDINGS: Cardiovascular: Satisfactory opacification of the pulmonary arteries to the segmental level. No evidence of pulmonary embolism. Slight respiratory motion degradation particularly at the lung bases. Thoracic aorta is nonaneurysmal. Scattered atherosclerotic calcifications of the aorta and coronary arteries. Heart size is mildly enlarged. No pericardial effusion. Mediastinum/Nodes: Overall decrease in size of multiple prominent mediastinal lymph nodes including 8 mm lower left paratracheal node and 6 mm AP window node. No axillary or hilar lymphadenopathy. Trachea and esophagus within normal limits. Subcentimeter right thyroid lobe nodule. Not clinically significant; no follow-up imaging recommended (ref: J Am Coll Radiol. 2015 Feb;12(2): 143-50). Lungs/Pleura: Trace bilateral pleural effusions, right greater than left with associated compressive atelectasis. Lungs are otherwise clear. No pneumothorax. Upper Abdomen: Reflux of contrast into the IVC and hepatic veins. No acute findings are seen within the included upper abdomen. Musculoskeletal: No chest wall abnormality. No acute or significant osseous findings. Review of the MIP images confirms the above findings. IMPRESSION: 1. Negative for pulmonary embolism. 2. Trace bilateral pleural effusions, right greater than left, with associated compressive atelectasis. 3. Overall decrease in size of multiple prominent mediastinal lymph nodes, which were likely  reactive. 4. Reflux of contrast into the IVC and hepatic veins, suggesting right heart dysfunction. Aortic Atherosclerosis (ICD10-I70.0). Electronically Signed   By: NDavina PokeD.O.   On: 08/28/2021 18:27     Medications:      amLODipine  10 mg Oral Daily   epoetin (EPOGEN/PROCRIT) injection  10,000 Units Intravenous Q T,Th,Sa-HD   heparin  5,000 Units Subcutaneous Q8H   hydrALAZINE  50 mg Oral Q8H   insulin aspart  0-9 Units Subcutaneous TID WC   losartan  50 mg Oral Daily   pantoprazole  40 mg Oral Daily   docusate sodium, heparin, ondansetron (ZOFRAN) IV, polyethylene glycol  Assessment/ Plan:  65y.o. female with end-stage renal disease (4 to 5 years), diabetes type 2, hyperlipidemia, history of stroke, diabetic retinopathy was admitted for  Hyperkalemia [E87.5] Bradycardia [R00.1] ESRD (end stage renal disease) ([place]) [N18.6] Altered mental status, unspecified altered mental status type [R41.82] Hyperkalemia  [facility]/TTS/[place]/right arm AV fistula  #.  Hyperkalemia with end stage renal disease on dialysis Lab Results  Component Value Date   K 5.4 (H) 08/30/2021    Last dialysis treatment on Saturday.  Multiple recent admissions for hyperkalemia Had angioplasty of access on 9/1  Elevated this am, but will correct with dialysis today  # Secondary hyperparathyroidism of renal origin N 25.81  Monitor calcium and  phos during admission Lab Results  Component Value Date   PTH 183 (H) 11/09/2016   CALCIUM 7.7 (L) 08/30/2021   PHOS 4.0 08/29/2021  Phosphorus at goal   # anemia of CKD  Lab Results  Component Value Date   HGB 8.0 (L) 08/30/2021    Continue EPO with HD  #Hypertension Blood pressure 196/51 Currently on amlodipine, hydralazine, and losartan   LOS: 3 Mallarie Voorhies 9/22/202212:04 PM

## 2021-08-30 NOTE — Progress Notes (Signed)
Patient is alert and oriented and tolerated tx well with a net UF GOAL of 0 with no complaints

## 2021-08-31 LAB — BASIC METABOLIC PANEL
Anion gap: 11 (ref 5–15)
BUN: 17 mg/dL (ref 8–23)
CO2: 30 mmol/L (ref 22–32)
Calcium: 7.8 mg/dL — ABNORMAL LOW (ref 8.9–10.3)
Chloride: 96 mmol/L — ABNORMAL LOW (ref 98–111)
Creatinine, Ser: 4.05 mg/dL — ABNORMAL HIGH (ref 0.44–1.00)
GFR, Estimated: 12 mL/min — ABNORMAL LOW (ref >60)
Glucose, Bld: 112 mg/dL — ABNORMAL HIGH (ref 70–99)
Potassium: 4.4 mmol/L (ref 3.5–5.1)
Sodium: 137 mmol/L (ref 135–145)

## 2021-08-31 LAB — GLUCOSE, CAPILLARY
Glucose-Capillary: 109 mg/dL — ABNORMAL HIGH (ref 70–99)
Glucose-Capillary: 170 mg/dL — ABNORMAL HIGH (ref 70–99)

## 2021-08-31 MED ORDER — HYDRALAZINE HCL 100 MG PO TABS: 100.0000 mg | ORAL_TABLET | Freq: Three times a day (TID) | ORAL | 2 refills | Status: AC

## 2021-08-31 MED ORDER — HYDRALAZINE HCL 50 MG PO TABS: 100.0000 mg | ORAL_TABLET | Freq: Three times a day (TID) | ORAL | Status: DC

## 2021-08-31 NOTE — Plan of Care (Signed)
  Problem: Education: Goal: Knowledge of General Education information will improve Description: Including pain rating scale, medication(s)/side effects and non-pharmacologic comfort measures Outcome: Adequate for Discharge   Problem: Health Behavior/Discharge Planning: Goal: Ability to manage health-related needs will improve Outcome: Adequate for Discharge   Problem: Clinical Measurements: Goal: Ability to maintain clinical measurements within normal limits will improve Outcome: Adequate for Discharge Goal: Will remain free from infection Outcome: Adequate for Discharge Goal: Diagnostic test results will improve Outcome: Adequate for Discharge Goal: Respiratory complications will improve Outcome: Adequate for Discharge Goal: Cardiovascular complication will be avoided Outcome: Adequate for Discharge   Problem: Activity: Goal: Risk for activity intolerance will decrease Outcome: Adequate for Discharge   Problem: Safety: Goal: Ability to remain free from injury will improve Outcome: Adequate for Discharge   Problem: Skin Integrity: Goal: Risk for impaired skin integrity will decrease Outcome: Adequate for Discharge

## 2021-08-31 NOTE — Progress Notes (Signed)
[facility], [place] 08/31/21  Subjective:   LOS: 4  Anita Contreras is a 65 y.o. [place] speaking female known to [place] from previous admissions This time she presents for cyanosis and unresponsiveness. Last dialysis treatment was on Saturday. In the ER noted to be bradycardic with HR in 20's. Given shifting measures in the ER- calcium gluconate, sodium bicarbonate, lokelma. Required cardiac pacing. Emergency dialysis the night of 9/19-9/20  Patient seen sitting up in bed Alert and oriented Awaiting breakfast States she feels well this morning Per nursing note, hypoglycemic last night, 52 Patient seen later eating breakfast, with daughter and granddaughter at bedside  Potassium 4.4  Objective:  Vital signs in last 24 hours:  Temp:  [98.5 F (36.9 C)-99.9 F (37.7 C)] 98.5 F (36.9 C) (09/23 0823) Pulse Rate:  [59-81] 64 (09/23 0823) Resp:  [15-28] 18 (09/23 0823) BP: (128-198)/(38-58) 140/38 (09/23 0823) SpO2:  [94 %-100 %] 94 % (09/23 0823) Weight:  [50.2 kg] 50.2 kg (09/23 0500)  Weight change: -0.3 kg Filed Weights   08/29/21 0500 08/30/21 0544 08/31/21 0500  Weight: 49.3 kg 50.5 kg 50.2 kg    Intake/Output:    Intake/Output Summary (Last 24 hours) at 08/31/2021 1119 Last data filed at 08/31/2021 0900 Gross per 24 hour  Intake 360 ml  Output 0 ml  Net 360 ml     Physical Exam: General:  NAD, laying in bed  HEENT  anicteric, moist oral mucous membrane  Pulm/lungs  normal breathing effort, lungs are clear to auscultation, [place] O2  CVS/Heart  regular rhythm, no rub or gallop  Abdomen:   Soft, nontender  Extremities:  No peripheral edema  Neurologic: Alert, oriented, following commands  Skin:  No acute rashes  Right arm AV fistula, + thrill  Basic Metabolic Panel:  Recent Labs  Lab 08/27/21 2101 08/28/21 0243 08/28/21 0340 08/28/21 0525 08/28/21 0910 08/29/21 0415 08/30/21 0551 08/31/21 0606  NA 140 141  --   --   137 135 135 137  K 7.1* >7.5*   < > 2.7* 4.0 4.8 5.4* 4.4  CL 106 103  --   --  96* 95* 95* 96*  CO2 20* 29  --   --  '30 31 27 30  '$ GLUCOSE 362* 171*  --   --  82 104* 98 112*  BUN 59* 66*  --   --  14 27* 43* 17  CREATININE 6.47* 6.89*  --   --  2.32* 4.42* 6.82* 4.05*  CALCIUM 12.4* 10.2  --   --  8.9 7.8* 7.7* 7.8*  MG 2.5* 2.8*  --   --   --   --   --   --   PHOS  --  4.4  --   --   --  4.0  --   --    < > = values in this interval not displayed.      CBC: Recent Labs  Lab 08/27/21 2101 08/28/21 0243 08/29/21 0415 08/30/21 0551  WBC 4.9 14.1* 6.3 5.0  NEUTROABS 4.1  --   --   --   HGB 8.7* 8.7* 7.8* 8.0*  HCT 27.8* 26.5* 23.9* 23.6*  MCV 110.3* 103.9* 103.0* 102.2*  PLT 170 176 139* 138*       Lab Results  Component Value Date   HEPBSAG NON REACTIVE 08/06/2021   HEPBSAB Reactive (A) 08/06/2021   HEPBIGM Negative 04/16/2016      Microbiology:  Recent Results (from the past 240  hour(s))  Resp Panel by RT-PCR (Flu A&B, Covid) Nasopharyngeal Swab     Status: None   Collection Time: 08/27/21  9:11 PM   Specimen: Nasopharyngeal Swab; Nasopharyngeal(NP) swabs in vial transport medium  Result Value Ref Range Status   SARS Coronavirus 2 by RT PCR NEGATIVE NEGATIVE Final    Comment: (NOTE) SARS-CoV-2 target nucleic acids are NOT DETECTED.  The SARS-CoV-2 RNA is generally detectable in upper respiratory specimens during the acute phase of infection. The lowest concentration of SARS-CoV-2 viral copies this assay can detect is 138 copies/mL. A negative result does not preclude SARS-Cov-2 infection and should not be used as the sole basis for treatment or other patient management decisions. A negative result may occur with  improper specimen collection/handling, submission of specimen other than nasopharyngeal swab, presence of viral mutation(s) within the areas targeted by this assay, and inadequate number of viral copies(<138 copies/mL). A negative result must be  combined with clinical observations, patient history, and epidemiological information. The expected result is Negative.  Fact Sheet for Patients:  EntrepreneurPulse.com.au  Fact Sheet for Healthcare Providers:  IncredibleEmployment.be  This test is no t yet approved or cleared by the [place] FDA and  has been authorized for detection and/or diagnosis of SARS-CoV-2 by FDA under an Emergency Use Authorization (EUA). This EUA will remain  in effect (meaning this test can be used) for the duration of the COVID-19 declaration under Section 564(b)(1) of the Act, 21 U.S.C.section 360bbb-3(b)(1), unless the authorization is terminated  or revoked sooner.       Influenza A by PCR NEGATIVE NEGATIVE Final   Influenza B by PCR NEGATIVE NEGATIVE Final    Comment: (NOTE) The Xpert Xpress SARS-CoV-2/FLU/RSV plus assay is intended as an aid in the diagnosis of influenza from Nasopharyngeal swab specimens and should not be used as a sole basis for treatment. Nasal washings and aspirates are unacceptable for Xpert Xpress SARS-CoV-2/FLU/RSV testing.  Fact Sheet for Patients: EntrepreneurPulse.com.au  Fact Sheet for Healthcare Providers: IncredibleEmployment.be  This test is not yet approved or cleared by the [place] FDA and has been authorized for detection and/or diagnosis of SARS-CoV-2 by FDA under an Emergency Use Authorization (EUA). This EUA will remain in effect (meaning this test can be used) for the duration of the COVID-19 declaration under Section 564(b)(1) of the Act, 21 U.S.C. section 360bbb-3(b)(1), unless the authorization is terminated or revoked.  Performed at [facility], [address] [place], [place] [address]   MRSA Next Gen by PCR, Nasal     Status: None   Collection Time: 08/28/21  1:08 AM   Specimen: Nasal Mucosa; Nasal Swab  Result Value Ref Range Status   MRSA by PCR  Next Gen NOT DETECTED NOT DETECTED Final    Comment: (NOTE) The GeneXpert MRSA Assay (FDA approved for NASAL specimens only), is one component of a comprehensive MRSA colonization surveillance program. It is not intended to diagnose MRSA infection nor to guide or monitor treatment for MRSA infections. Test performance is not FDA approved in patients less than 18 years old. Performed at [facility], [address] [place], [place] [address]   CULTURE, BLOOD (ROUTINE X 2) w Reflex to ID Panel     Status: None (Preliminary result)   Collection Time: 08/29/21 10:27 AM   Specimen: BLOOD  Result Value Ref Range Status   Specimen Description BLOOD  [facility]  Final   Special Requests   Final    BOTTLES DRAWN AEROBIC AND  ANAEROBIC Blood Culture adequate volume   Culture   Final    NO GROWTH 2 DAYS Performed at [facility], [address] [place], [place] [address]    Report Status PENDING  Incomplete  CULTURE, BLOOD (ROUTINE X 2) w Reflex to ID Panel     Status: None (Preliminary result)   Collection Time: 08/29/21 10:27 AM   Specimen: BLOOD  Result Value Ref Range Status   Specimen Description BLOOD  [facility]  Final   Special Requests   Final    BOTTLES DRAWN AEROBIC AND ANAEROBIC Blood Culture adequate volume   Culture   Final    NO GROWTH 2 DAYS Performed at [facility], [address] [place], [place] [address]    Report Status PENDING  Incomplete    Coagulation Studies: No results for input(s): LABPROT, INR in the last 72 hours.   Urinalysis: No results for input(s): COLORURINE, LABSPEC, PHURINE, GLUCOSEU, HGBUR, BILIRUBINUR, KETONESUR, PROTEINUR, UROBILINOGEN, NITRITE, LEUKOCYTESUR in the last 72 hours.  Invalid input(s): APPERANCEUR    Imaging: No results found.   Medications:      amLODipine  10 mg Oral Daily   epoetin (EPOGEN/PROCRIT) injection  10,000 Units Intravenous Q T,Th,Sa-HD   heparin  5,000 Units Subcutaneous Q8H    hydrALAZINE  100 mg Oral Q8H   insulin aspart  0-9 Units Subcutaneous TID WC   pantoprazole  40 mg Oral Daily   docusate sodium, hydrALAZINE, ondansetron (ZOFRAN) IV, polyethylene glycol  Assessment/ Plan:  65 y.o. female with end-stage renal disease (4 to 5 years), diabetes type 2, hyperlipidemia, history of stroke, diabetic retinopathy was admitted for  Hyperkalemia [E87.5] Bradycardia [R00.1] ESRD (end stage renal disease) ([place]) [N18.6] Altered mental status, unspecified altered mental status type [R41.82] Hyperkalemia  [facility]/TTS/[place]/right arm AV fistula  #.  Hyperkalemia with end stage renal disease on dialysis Lab Results  Component Value Date   K 4.4 08/31/2021    Last dialysis treatment on Saturday.  Multiple recent admissions for hyperkalemia Had angioplasty of access on 9/1  Received dialysis yesterday, tolerated well. Next treatment scheduled for Saturday  # Secondary hyperparathyroidism of renal origin N 25.81  Monitor calcium and  phos during admission Lab Results  Component Value Date   PTH 183 (H) 11/09/2016   CALCIUM 7.8 (L) 08/31/2021   PHOS 4.0 08/29/2021  Calcium below target Continue calcium acetate with meals  # anemia of CKD  Lab Results  Component Value Date   HGB 8.0 (L) 08/30/2021    Continue EPO with HD  #Hypertension Blood pressure 196/51 Currently on amlodipine, hydralazine, and losartan  # Diabetes mellitus type II with chronic kidney disease insulin dependent. Home regimen includes NPH. Most recent hemoglobin A1c is 6.0 on 08/07/21.  Hypoglycemic yesterday evening to 52. Recovered and eating breakfast this am   LOS: 4 Anita Contreras 9/23/202211:19 AM

## 2021-08-31 NOTE — Progress Notes (Signed)
Patient's blood sugar at 2129 was 52. Patient remained alert and oriented x4. Patient does not have any swallowing difficulties. Patient is given 15 grams of oral carbohydrates. Patient blood glucose is rechecked and is now 107. Patient remain in no acute distress.

## 2021-08-31 NOTE — Progress Notes (Signed)
AVS instructions provided via  interpretor services. Pt denies any questions and NAD. Family notified of pt discharge at this time.

## 2021-08-31 NOTE — Discharge Summary (Signed)
Fevers orPhysician Discharge Summary  Anita Contreras [id number] DOB: [birth year] DOA: 08/27/2021  PCP: Frazier Richards, MD  Admit date: 08/27/2021 Discharge date: 08/31/2021  Admitted From: Home Disposition:  Home  Recommendations for Outpatient Follow-up:  Follow up with PCP in 1 week  Discharge Condition: Stable CODE STATUS: Full  Diet recommendation: Renal   Brief/Interim Summary: Anita Contreras is a 65 year old female with significant history of ESRD on HD, presented to [facility] ED on 08/27/2021 from home via EMS due to loss of consciousness.  Patient described, with Spanish interpreter assistance, being in her normal state of health until around 4 PM.  She reports feeling extremely fatigued, nauseous and then her vision " went black" and she vomited what she believes was nonbloody bilious emesis.  The patient had no knowledge of transporting to the hospital.  Patient and daughter confirmed that she did not fall to the ground with this syncopal episode.  Both confirmed her last dialysis session was on Saturday as per her normal Tuesday Thursday Saturday schedule.  Patient reports making herself tomato salsa with her morning egg today, otherwise no changes to her diet.  She denies chest pain, dyspnea, no current complaints of blurred vision/dizziness/headache/nausea. Per ED documentation EMS staff reported the patient was found unresponsive with heart rate in the 20s.  They started transcutaneously pacing, administered 2 mg of Versed, calcium and sodium bicarbonate in the field due to concerns for potential hyperkalemia. Per ED documentation upon arrival patient was altered with low heart rates in the 20s to 30s.  She received an additional dose of calcium & sodium bicarbonate with the addition of 10 units of insulin.  CBG was in the 400s so D50 was held.  EDP paused TC pacing, but then had to restart within 2 to 3 minutes as the heart rate dropped back down into the 20s.   Cardiologist Dr. Fletcher Anon was consulted due to potential need for temporary pacing wires.  Dr. Fletcher Anon recommended initiation of dopamine drip and verifying potassium level prior to any cardiology intervention. After these interventions patient became more alert with heart rate in the 70s and normal blood pressure.  Dr. Candiss Norse was consulted, who recommended [facility] and emergent dialysis.  PCCM was consulted for admission.   After emergent dialysis, potassium level normalized and bradycardia resolved. Hospitalization prolonged for hypoxia and fever work up, both of which resolved without specific treatment and work up negative.  On day of discharge, patient was feeling well without any complaints of shortness of breath, chest pain, nausea, vomiting, diarrhea, dysuria, fevers or chills.  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   Acute hypoxemic respiratory failure -CTA chest negative for PE, no focal consolidation seen  Diabetes mellitus type 2 with hyperglycemia -Continue sliding scale insulin   Hypertension -Continue Norvasc, hydralazine - increase dose and discontinue losartan due to recurrent hyperkalemia   Discharge Instructions  Discharge Instructions     Call MD for:  difficulty breathing, headache or visual disturbances   Complete by: As  directed    Call MD for:  extreme fatigue   Complete by: As directed    Call MD for:  persistant dizziness  or light-headedness   Complete by: As directed    Call MD for:  persistant nausea and vomiting   Complete by: As directed    Call MD for:  severe uncontrolled pain   Complete by: As directed    Call MD for:  temperature >100.4   Complete by: As directed    Increase activity slowly   Complete by: As directed       Allergies as of 08/31/2021       Reactions   Dextromethorphan-guaifenesin    Other reaction(s): Itching of skin (finding)   Propofol Itching   Pt says " when getting anesthesia by body itches" unsure what type of anesthesia        Medication List     STOP taking these medications    furosemide 20 MG tablet Commonly known as: LASIX   losartan 50 MG tablet Commonly known as: COZAAR       TAKE these medications    acetaminophen 500 MG tablet Commonly known as: TYLENOL Take 1,000 mg by mouth every 6 (six) hours as needed for mild pain or headache.   amLODipine 10 MG tablet Commonly known as: NORVASC Take 1 tablet (10 mg total) by mouth daily.   aspirin 81 MG EC tablet TAKE 1 TABLET BY MOUTH EVERY DAY   atorvastatin 40 MG tablet Commonly known as: LIPITOR Take 40 mg by mouth daily at 6 PM.   calcium acetate 667 MG capsule Commonly known as: PHOSLO Take 1 capsule (667 mg total) by mouth 3 (three) times daily with meals.   hydrALAZINE 100 MG tablet Commonly known as: APRESOLINE Take 1 tablet (100 mg total) by mouth every 8 (eight) hours.   hydrOXYzine 50 MG tablet Commonly known as: ATARAX/VISTARIL Take 25 mg by mouth 2 (two) times daily as needed for itching.   insulin NPH-regular Human (70-30) 100 UNIT/ML injection Inject 3 Units into the skin 2 (two) times daily with a meal.   lidocaine-prilocaine cream Commonly known as: EMLA Apply 1 application topically as needed.   pantoprazole 40 MG tablet Commonly known as: PROTONIX Take 1 tablet (40 mg total) by mouth daily.        Follow-up Information     Adamo, Hattie Perch, MD Follow up.    Specialty: Family Medicine Contact information: [place] [address] [phone]                Allergies  Allergen Reactions   Dextromethorphan-Guaifenesin     Other reaction(s): Itching of skin (finding)   Propofol Itching    Pt says " when getting anesthesia by body itches" unsure what type of anesthesia    Consultations: PCCM admission Cardiology Nephrology    Procedures/Studies: CT Angio Chest Pulmonary Embolism (PE) W or WO Contrast  Result Date: 08/28/2021 CLINICAL DATA:  PE suspected, low/intermediate prob, positive D-dimer, shortness of breath EXAM: CT ANGIOGRAPHY CHEST WITH CONTRAST TECHNIQUE: Multidetector CT imaging of the chest was performed using the standard protocol during bolus administration of intravenous contrast. Multiplanar CT image reconstructions and MIPs were obtained to evaluate the vascular anatomy. CONTRAST:  35m OMNIPAQUE IOHEXOL 350 MG/ML SOLN COMPARISON:  CT 02/10/2021, chest x-ray 08/27/2021 FINDINGS: Cardiovascular: Satisfactory opacification of the pulmonary arteries to the segmental level. No evidence of pulmonary embolism. Slight respiratory motion degradation particularly at the lung bases. Thoracic  aorta is nonaneurysmal. Scattered atherosclerotic calcifications of the aorta and coronary arteries. Heart size is mildly enlarged. No pericardial effusion. Mediastinum/Nodes: Overall decrease in size of multiple prominent mediastinal lymph nodes including 8 mm lower left paratracheal node and 6 mm AP window node. No axillary or hilar lymphadenopathy. Trachea and esophagus within normal limits. Subcentimeter right thyroid lobe nodule. Not clinically significant; no follow-up imaging recommended (ref: J Am Coll Radiol. 2015 Feb;12(2): 143-50). Lungs/Pleura: Trace bilateral pleural effusions, right greater than left with associated compressive atelectasis. Lungs are otherwise clear. No pneumothorax. Upper Abdomen: Reflux of contrast  into the IVC and hepatic veins. No acute findings are seen within the included upper abdomen. Musculoskeletal: No chest wall abnormality. No acute or significant osseous findings. Review of the MIP images confirms the above findings. IMPRESSION: 1. Negative for pulmonary embolism. 2. Trace bilateral pleural effusions, right greater than left, with associated compressive atelectasis. 3. Overall decrease in size of multiple prominent mediastinal lymph nodes, which were likely reactive. 4. Reflux of contrast into the IVC and hepatic veins, suggesting right heart dysfunction. Aortic Atherosclerosis (ICD10-I70.0). Electronically Signed   By: Davina Poke D.O.   On: 08/28/2021 18:27   [place] Dialysis Access  Result Date: 08/28/2021 CLINICAL DATA:  Right brachiocephalic fistula. EXAM: ULTRASOUND DIALYSIS ACCESS TECHNIQUE: Sonographic grayscale and duplex with spectral analysis and color-flow of right brachiocephalic fistula was performed. COMPARISON:  None. FINDINGS: Right brachiocephalic fistula is patent. Long segment stent is noted. Velocities throughout the stent range between 105 and 169 centimeters/second. No evidence of significant stenosis within the stent. Mildly elevated velocity noted in the brachial artery just proximal to the anastomosis measuring 333 millimeters/second. Normal velocities noted within the Peri anastomotic venous outflow measuring 169 centimeters/second. IMPRESSION: Patent right brachiocephalic fistula without evidence of significant stenosis. 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 Electronically Signed   By: Miachel Roux M.D.   On: 08/28/2021 10:50   PERIPHERAL VASCULAR CATHETERIZATION  Result Date: 08/09/2021 See surgical note for result.  DG Chest Portable 1 View  Result Date: 08/27/2021 CLINICAL DATA:  Bradycardia and unresponsiveness EXAM: PORTABLE CHEST 1 VIEW COMPARISON:  02/10/2021 FINDINGS: Cardiac shadow is enlarged but stable. Lungs  are well aerated bilaterally. No focal infiltrate or sizable effusion is seen. Stenting is noted in the right shoulder stable from the prior exam. No sizable effusion is noted. No bony abnormality is seen. IMPRESSION: No acute abnormality noted. Electronically Signed   By: Inez Catalina M.D.   On: 08/27/2021 21:16       Discharge Exam: Vitals:   08/31/21 0450 08/31/21 0823  BP: (!) 164/58 (!) 140/38  Pulse: (!) 59 64  Resp: 16 18  Temp: 99.1 F (37.3 C) 98.5 F (36.9 C)  SpO2: 100% 94%    General: Pt is alert, awake, not in acute distress Cardiovascular: RRR, S1/S2 +, no edema Respiratory: CTA bilaterally, no wheezing, no rhonchi, no respiratory distress, no conversational dyspnea  Abdominal: Soft, NT, ND, bowel sounds + Extremities: no edema, no cyanosis Psych: Normal mood and affect, stable judgement and insight     The results of significant diagnostics from this hospitalization (including imaging, microbiology, ancillary and laboratory) are listed below for reference.     Microbiology: Recent Results (from the past 240 hour(s))  Resp Panel by RT-PCR (Flu A&B, Covid) Nasopharyngeal Swab     Status: None   Collection Time: 08/27/21  9:11 PM   Specimen: Nasopharyngeal Swab; Nasopharyngeal(NP) swabs in vial transport medium  Result Value Ref Range Status   SARS Coronavirus 2 by RT PCR NEGATIVE NEGATIVE Final    Comment: (NOTE) SARS-CoV-2 target nucleic acids are NOT DETECTED.  The SARS-CoV-2 RNA is generally detectable in upper respiratory specimens during the acute phase of infection. The lowest concentration of SARS-CoV-2 viral copies this assay can detect is 138 copies/mL. A negative result does not preclude SARS-Cov-2 infection and should not be used as the sole basis for treatment or other patient management decisions. A negative result may occur with  improper specimen collection/handling, submission of specimen other than nasopharyngeal swab, presence of viral  mutation(s) within the areas targeted by this assay, and inadequate number of viral copies(<138 copies/mL). A negative result must be combined with clinical observations, patient history, and epidemiological information. The expected result is Negative.  Fact Sheet for Patients:  EntrepreneurPulse.com.au  Fact Sheet for Healthcare Providers:  IncredibleEmployment.be  This test is no t yet approved or cleared by the [place] FDA and  has been authorized for detection and/or diagnosis of SARS-CoV-2 by FDA under an Emergency Use Authorization (EUA). This EUA will remain  in effect (meaning this test can be used) for the duration of the COVID-19 declaration under Section 564(b)(1) of the Act, 21 U.S.C.section 360bbb-3(b)(1), unless the authorization is terminated  or revoked sooner.       Influenza A by PCR NEGATIVE NEGATIVE Final   Influenza B by PCR NEGATIVE NEGATIVE Final    Comment: (NOTE) The Xpert Xpress SARS-CoV-2/FLU/RSV plus assay is intended as an aid in the diagnosis of influenza from Nasopharyngeal swab specimens and should not be used as a sole basis for treatment. Nasal washings and aspirates are unacceptable for Xpert Xpress SARS-CoV-2/FLU/RSV testing.  Fact Sheet for Patients: EntrepreneurPulse.com.au  Fact Sheet for Healthcare Providers: IncredibleEmployment.be  This test is not yet approved or cleared by the [place] FDA and has been authorized for detection and/or diagnosis of SARS-CoV-2 by FDA under an Emergency Use Authorization (EUA). This EUA will remain in effect (meaning this test can be used) for the duration of the COVID-19 declaration under Section 564(b)(1) of the Act, 21 U.S.C. section 360bbb-3(b)(1), unless the authorization is terminated or revoked.  Performed at [facility], [address] [place], [place] [address]   MRSA Next Gen by PCR, Nasal      Status: None   Collection Time: 08/28/21  1:08 AM   Specimen: Nasal Mucosa; Nasal Swab  Result Value Ref Range Status   MRSA by PCR Next Gen NOT DETECTED NOT DETECTED Final    Comment: (NOTE) The GeneXpert MRSA Assay (FDA approved for NASAL specimens only), is one component of a comprehensive MRSA colonization surveillance program. It is not intended to diagnose MRSA infection nor to guide or monitor treatment for MRSA infections. Test performance is not FDA approved in patients less than 37 years old. Performed at [facility], [address] [place], [place] [address]   CULTURE, BLOOD (ROUTINE X 2) w Reflex to ID Panel     Status: None (Preliminary result)   Collection Time: 08/29/21 10:27 AM   Specimen: BLOOD  Result Value Ref Range Status   Specimen Description BLOOD  [facility]  Final   Special Requests   Final    BOTTLES DRAWN AEROBIC AND ANAEROBIC Blood Culture adequate volume   Culture   Final    NO GROWTH 2 DAYS Performed at [facility], [address], [place], [place] [address]    Report Status PENDING  Incomplete  CULTURE, BLOOD (ROUTINE X 2) w Reflex to ID Panel     Status: None (Preliminary result)   Collection Time: 08/29/21 10:27 AM   Specimen: BLOOD  Result Value Ref Range Status   Specimen Description BLOOD  [facility]  Final   Special Requests   Final    BOTTLES DRAWN AEROBIC AND ANAEROBIC Blood Culture adequate volume   Culture   Final    NO GROWTH 2 DAYS Performed at [facility], [address] [place], [place] [address]    Report Status PENDING  Incomplete     Labs: BNP (last 3 results) No results for input(s): BNP in the last 8760 hours. Basic Metabolic Panel: Recent Labs  Lab 08/27/21 2101 08/28/21 0243 08/28/21 0340 08/28/21 0525 08/28/21 0910 08/29/21 0415 08/30/21 0551 08/31/21 0606  NA 140 141  --   --  137 135 135 137  K 7.1* >7.5*   < > 2.7* 4.0 4.8 5.4* 4.4  CL 106 103  --   --  96* 95* 95* 96*  CO2 20* 29  --    --  '30 31 27 30  '$ GLUCOSE 362* 171*  --   --  82 104* 98 112*  BUN 59* 66*  --   --  14 27* 43* 17  CREATININE 6.47* 6.89*  --   --  2.32* 4.42* 6.82* 4.05*  CALCIUM 12.4* 10.2  --   --  8.9 7.8* 7.7* 7.8*  MG 2.5* 2.8*  --   --   --   --   --   --   PHOS  --  4.4  --   --   --  4.0  --   --    < > = values in this interval not displayed.   Liver Function Tests: Recent Labs  Lab 08/27/21 2101 08/29/21 0415  AST 270*  --   ALT 125*  --   ALKPHOS 137*  --   BILITOT 0.9  --   PROT 5.8*  --   ALBUMIN 3.1* 3.0*   No results for input(s): LIPASE, AMYLASE in the last 168 hours. No results for input(s): AMMONIA in the last 168 hours. CBC: Recent Labs  Lab 08/27/21 2101 08/28/21 0243 08/29/21 0415 08/30/21 0551  WBC 4.9 14.1* 6.3 5.0  NEUTROABS 4.1  --   --   --   HGB 8.7* 8.7* 7.8* 8.0*  HCT 27.8* 26.5* 23.9* 23.6*  MCV 110.3* 103.9* 103.0* 102.2*  PLT 170 176 139* 138*   Cardiac Enzymes: No results for input(s): CKTOTAL, CKMB, CKMBINDEX, TROPONINI in the last 168 hours. BNP: Invalid input(s): POCBNP CBG: Recent Labs  Lab 08/30/21 1705 08/30/21 2129 08/30/21 2205 08/31/21 0913 08/31/21 1156  GLUCAP 200* 52* 107* 109* 170*   D-Dimer No results for input(s): DDIMER in the last 72 hours. Hgb A1c No results for input(s): HGBA1C in the last 72 hours. Lipid Profile No results for input(s): CHOL, HDL, LDLCALC, TRIG, CHOLHDL, LDLDIRECT in the last 72 hours. Thyroid function studies No results for input(s): TSH, T4TOTAL, T3FREE, THYROIDAB in the last 72 hours.  Invalid input(s): FREET3 Anemia work up No results for input(s): VITAMINB12, FOLATE, FERRITIN, TIBC, IRON, RETICCTPCT in the last 72 hours. Urinalysis    Component Value Date/Time   COLORURINE YELLOW (A) 08/07/2020 0858   APPEARANCEUR CLEAR (A) 08/07/2020 0858   LABSPEC 1.013 08/07/2020 0858   PHURINE 8.0 08/07/2020 [place] 08/07/2020 0858   HGBUR NEGATIVE 08/07/2020 ID:[id number]  BILIRUBINUR  NEGATIVE 08/07/2020 [place] 08/07/2020 0858   PROTEINUR >=300 (A) 08/07/2020 0858   NITRITE NEGATIVE 08/07/2020 0858   LEUKOCYTESUR NEGATIVE 08/07/2020 0858   Sepsis Labs Invalid input(s): PROCALCITONIN,  WBC,  LACTICIDVEN Microbiology Recent Results (from the past 240 hour(s))  Resp Panel by RT-PCR (Flu A&B, Covid) Nasopharyngeal Swab     Status: None   Collection Time: 08/27/21  9:11 PM   Specimen: Nasopharyngeal Swab; Nasopharyngeal(NP) swabs in vial transport medium  Result Value Ref Range Status   SARS Coronavirus 2 by RT PCR NEGATIVE NEGATIVE Final    Comment: (NOTE) SARS-CoV-2 target nucleic acids are NOT DETECTED.  The SARS-CoV-2 RNA is generally detectable in upper respiratory specimens during the acute phase of infection. The lowest concentration of SARS-CoV-2 viral copies this assay can detect is 138 copies/mL. A negative result does not preclude SARS-Cov-2 infection and should not be used as the sole basis for treatment or other patient management decisions. A negative result may occur with  improper specimen collection/handling, submission of specimen other than nasopharyngeal swab, presence of viral mutation(s) within the areas targeted by this assay, and inadequate number of viral copies(<138 copies/mL). A negative result must be combined with clinical observations, patient history, and epidemiological information. The expected result is Negative.  Fact Sheet for Patients:  EntrepreneurPulse.com.au  Fact Sheet for Healthcare Providers:  IncredibleEmployment.be  This test is no t yet approved or cleared by the [place] FDA and  has been authorized for detection and/or diagnosis of SARS-CoV-2 by FDA under an Emergency Use Authorization (EUA). This EUA will remain  in effect (meaning this test can be used) for the duration of the COVID-19 declaration under Section 564(b)(1) of the Act, 21 U.S.C.section  360bbb-3(b)(1), unless the authorization is terminated  or revoked sooner.       Influenza A by PCR NEGATIVE NEGATIVE Final   Influenza B by PCR NEGATIVE NEGATIVE Final    Comment: (NOTE) The Xpert Xpress SARS-CoV-2/FLU/RSV plus assay is intended as an aid in the diagnosis of influenza from Nasopharyngeal swab specimens and should not be used as a sole basis for treatment. Nasal washings and aspirates are unacceptable for Xpert Xpress SARS-CoV-2/FLU/RSV testing.  Fact Sheet for Patients: EntrepreneurPulse.com.au  Fact Sheet for Healthcare Providers: IncredibleEmployment.be  This test is not yet approved or cleared by the [place] FDA and has been authorized for detection and/or diagnosis of SARS-CoV-2 by FDA under an Emergency Use Authorization (EUA). This EUA will remain in effect (meaning this test can be used) for the duration of the COVID-19 declaration under Section 564(b)(1) of the Act, 21 U.S.C. section 360bbb-3(b)(1), unless the authorization is terminated or revoked.  Performed at [facility], [address] [place], [place] [address]   MRSA Next Gen by PCR, Nasal     Status: None   Collection Time: 08/28/21  1:08 AM   Specimen: Nasal Mucosa; Nasal Swab  Result Value Ref Range Status   MRSA by PCR Next Gen NOT DETECTED NOT DETECTED Final    Comment: (NOTE) The GeneXpert MRSA Assay (FDA approved for NASAL specimens only), is one component of a comprehensive MRSA colonization surveillance program. It is not intended to diagnose MRSA infection nor to guide or monitor treatment for MRSA infections. Test performance is not FDA approved in patients less than 64 years old. Performed at [facility], [place],  [address]   CULTURE, BLOOD (ROUTINE X 2) w Reflex to ID Panel  Status: None (Preliminary result)   Collection Time: 08/29/21 10:27 AM   Specimen: BLOOD  Result Value Ref  Range Status   Specimen Description BLOOD  [facility]  Final   Special Requests   Final    BOTTLES DRAWN AEROBIC AND ANAEROBIC Blood Culture adequate volume   Culture   Final    NO GROWTH 2 DAYS Performed at [facility], [address] [place], [place] [address]    Report Status PENDING  Incomplete  CULTURE, BLOOD (ROUTINE X 2) w Reflex to ID Panel     Status: None (Preliminary result)   Collection Time: 08/29/21 10:27 AM   Specimen: BLOOD  Result Value Ref Range Status   Specimen Description BLOOD  [facility]  Final   Special Requests   Final    BOTTLES DRAWN AEROBIC AND ANAEROBIC Blood Culture adequate volume   Culture   Final    NO GROWTH 2 DAYS Performed at [facility], [address] [place], [place] [address]    Report Status PENDING  Incomplete     Patient was seen and examined on the day of discharge and was found to be in stable condition. Time coordinating discharge: 35 minutes including assessment and coordination of care, as well as examination of the patient.   SIGNED:  Dessa Phi, DO [facility] 08/31/2021, 1:13 PM

## 2021-08-31 NOTE — Progress Notes (Signed)
Oxygen sats 95% at rest and 93-95% while ambulating in hall.

## 2021-09-03 LAB — CULTURE, BLOOD (ROUTINE X 2)
Culture: NO GROWTH
Culture: NO GROWTH
Special Requests: ADEQUATE
Special Requests: ADEQUATE

## 2022-12-09 ENCOUNTER — Emergency Department: Payer: Self-pay

## 2022-12-09 ENCOUNTER — Other Ambulatory Visit: Payer: Self-pay

## 2022-12-09 ENCOUNTER — Observation Stay
Admission: EM | Admit: 2022-12-09 | Discharge: 2022-12-11 | Disposition: A | Discharge status: Home Health | Payer: Self-pay | Attending: Internal Medicine | Admitting: Internal Medicine

## 2022-12-09 DIAGNOSIS — Z79899 Other long term (current) drug therapy: Secondary | ICD-10-CM | POA: Insufficient documentation

## 2022-12-09 DIAGNOSIS — M79604 Pain in right leg: Secondary | ICD-10-CM

## 2022-12-09 DIAGNOSIS — I120 Hypertensive chronic kidney disease with stage 5 chronic kidney disease or end stage renal disease: Secondary | ICD-10-CM | POA: Insufficient documentation

## 2022-12-09 DIAGNOSIS — E1122 Type 2 diabetes mellitus with diabetic chronic kidney disease: Secondary | ICD-10-CM | POA: Insufficient documentation

## 2022-12-09 DIAGNOSIS — M5416 Radiculopathy, lumbar region: Principal | ICD-10-CM | POA: Insufficient documentation

## 2022-12-09 DIAGNOSIS — K219 Gastro-esophageal reflux disease without esophagitis: Secondary | ICD-10-CM | POA: Diagnosis present

## 2022-12-09 DIAGNOSIS — N186 End stage renal disease: Secondary | ICD-10-CM | POA: Insufficient documentation

## 2022-12-09 DIAGNOSIS — Z7982 Long term (current) use of aspirin: Secondary | ICD-10-CM | POA: Insufficient documentation

## 2022-12-09 DIAGNOSIS — R531 Weakness: Secondary | ICD-10-CM | POA: Insufficient documentation

## 2022-12-09 DIAGNOSIS — E785 Hyperlipidemia, unspecified: Secondary | ICD-10-CM | POA: Diagnosis present

## 2022-12-09 DIAGNOSIS — R262 Difficulty in walking, not elsewhere classified: Secondary | ICD-10-CM

## 2022-12-09 DIAGNOSIS — E11319 Type 2 diabetes mellitus with unspecified diabetic retinopathy without macular edema: Secondary | ICD-10-CM | POA: Insufficient documentation

## 2022-12-09 DIAGNOSIS — Z992 Dependence on renal dialysis: Secondary | ICD-10-CM | POA: Insufficient documentation

## 2022-12-09 DIAGNOSIS — I10 Essential (primary) hypertension: Secondary | ICD-10-CM | POA: Diagnosis present

## 2022-12-09 DIAGNOSIS — Z8673 Personal history of transient ischemic attack (TIA), and cerebral infarction without residual deficits: Secondary | ICD-10-CM | POA: Insufficient documentation

## 2022-12-09 DIAGNOSIS — R29898 Other symptoms and signs involving the musculoskeletal system: Secondary | ICD-10-CM

## 2022-12-09 DIAGNOSIS — E875 Hyperkalemia: Secondary | ICD-10-CM | POA: Insufficient documentation

## 2022-12-09 LAB — COMPREHENSIVE METABOLIC PANEL
ALT: 12 U/L (ref 0–44)
AST: 21 U/L (ref 15–41)
Albumin: 4 g/dL (ref 3.5–5.0)
Alkaline Phosphatase: 166 U/L — ABNORMAL HIGH (ref 38–126)
Anion gap: 16 — ABNORMAL HIGH (ref 5–15)
BUN: 96 mg/dL — ABNORMAL HIGH (ref 8–23)
CO2: 23 mmol/L (ref 22–32)
Calcium: 8.2 mg/dL — ABNORMAL LOW (ref 8.9–10.3)
Chloride: 97 mmol/L — ABNORMAL LOW (ref 98–111)
Creatinine, Ser: 9 mg/dL — ABNORMAL HIGH (ref 0.44–1.00)
GFR, Estimated: 4 mL/min — ABNORMAL LOW (ref >60)
Glucose, Bld: 128 mg/dL — ABNORMAL HIGH (ref 70–99)
Potassium: 5.6 mmol/L — ABNORMAL HIGH (ref 3.5–5.1)
Sodium: 136 mmol/L (ref 135–145)
Total Bilirubin: 0.7 mg/dL (ref 0.3–1.2)
Total Protein: 7 g/dL (ref 6.5–8.1)

## 2022-12-09 LAB — CBC
HCT: 40.3 % (ref 36.0–46.0)
Hemoglobin: 12.5 g/dL (ref 12.0–15.0)
MCH: 31.7 pg (ref 26.0–34.0)
MCHC: 31 g/dL (ref 30.0–36.0)
MCV: 102.3 fL — ABNORMAL HIGH (ref 80.0–100.0)
Platelets: 197 10*3/uL (ref 150–400)
RBC: 3.94 MIL/uL (ref 3.87–5.11)
RDW: 15.9 % — ABNORMAL HIGH (ref 11.5–15.5)
WBC: 7.1 10*3/uL (ref 4.0–10.5)
nRBC: 0 % (ref 0.0–0.2)

## 2022-12-09 LAB — MAGNESIUM: Magnesium: 2.9 mg/dL — ABNORMAL HIGH (ref 1.7–2.4)

## 2022-12-09 LAB — PHOSPHORUS: Phosphorus: 4.8 mg/dL — ABNORMAL HIGH (ref 2.5–4.6)

## 2022-12-09 MED ORDER — AMLODIPINE BESYLATE 10 MG PO TABS
10.0000 mg | ORAL_TABLET | Freq: Every day | ORAL | Status: DC
  Administered 2022-12-09 – 2022-12-11 (×3): 10 mg via ORAL
  Filled 2022-12-09: qty 2
  Filled 2022-12-09 (×2): qty 1

## 2022-12-09 MED ORDER — PATIROMER SORBITEX CALCIUM 8.4 G PO PACK: 8.4000 g | PACK | Freq: Every day | ORAL | Status: DC

## 2022-12-09 MED ORDER — PATIROMER SORBITEX CALCIUM 8.4 G PO PACK
8.4000 g | PACK | Freq: Once | ORAL | Status: AC
  Administered 2022-12-09: 8.4 g via ORAL
  Filled 2022-12-09: qty 1

## 2022-12-09 MED ORDER — ACETAMINOPHEN 325 MG PO TABS: 650.0000 mg | ORAL_TABLET | Freq: Four times a day (QID) | ORAL | Status: DC | PRN

## 2022-12-09 MED ORDER — ACETAMINOPHEN 650 MG RE SUPP: 650.0000 mg | Freq: Four times a day (QID) | RECTAL | Status: DC | PRN

## 2022-12-09 MED ORDER — ENOXAPARIN SODIUM 40 MG/0.4ML IJ SOSY
40.0000 mg | PREFILLED_SYRINGE | INTRAMUSCULAR | Status: DC
  Administered 2022-12-09 – 2022-12-10 (×2): 40 mg via SUBCUTANEOUS
  Filled 2022-12-09 (×2): qty 0.4

## 2022-12-09 MED ORDER — HYDROXYZINE HCL 50 MG PO TABS: 25.0000 mg | ORAL_TABLET | Freq: Two times a day (BID) | ORAL | Status: DC | PRN

## 2022-12-09 MED ORDER — INSULIN ASPART PROT & ASPART (70-30 MIX) 100 UNIT/ML ~~LOC~~ SUSP
3.0000 [IU] | Freq: Two times a day (BID) | SUBCUTANEOUS | Status: DC
  Filled 2022-12-09: qty 10

## 2022-12-09 MED ORDER — MAGNESIUM HYDROXIDE 400 MG/5ML PO SUSP: 30.0000 mL | Freq: Every day | ORAL | Status: DC | PRN

## 2022-12-09 MED ORDER — SODIUM CHLORIDE 0.9 % IV SOLN: INTRAVENOUS | Status: DC

## 2022-12-09 MED ORDER — ONDANSETRON HCL 4 MG PO TABS: 4.0000 mg | ORAL_TABLET | Freq: Four times a day (QID) | ORAL | Status: DC | PRN

## 2022-12-09 MED ORDER — ONDANSETRON HCL 4 MG/2ML IJ SOLN: 4.0000 mg | Freq: Four times a day (QID) | INTRAMUSCULAR | Status: DC | PRN

## 2022-12-09 MED ORDER — MORPHINE SULFATE (PF) 2 MG/ML IV SOLN: 2.0000 mg | INTRAVENOUS | Status: DC | PRN

## 2022-12-09 MED ORDER — HYDRALAZINE HCL 50 MG PO TABS
100.0000 mg | ORAL_TABLET | Freq: Three times a day (TID) | ORAL | Status: DC
  Administered 2022-12-09 – 2022-12-11 (×6): 100 mg via ORAL
  Filled 2022-12-09 (×6): qty 2

## 2022-12-09 MED ORDER — TRAZODONE HCL 50 MG PO TABS: 25.0000 mg | ORAL_TABLET | Freq: Every evening | ORAL | Status: DC | PRN

## 2022-12-09 NOTE — Assessment & Plan Note (Signed)
Blood pressure mildly elevated. - Continue home amlodipine and hydralazine.

## 2022-12-09 NOTE — H&P (Addendum)
[place]   PATIENT NAME: Anita Contreras    MR#:  [id number]  DATE OF BIRTH:  1956-11-03  DATE OF ADMISSION:  12/09/2022  PRIMARY CARE PHYSICIAN: Frazier Richards, MD   Patient is coming from: Home  REQUESTING/REFERRING PHYSICIAN: Victorino Dike, FNP  CHIEF COMPLAINT:   Chief Complaint  Patient presents with   Leg Pain   The patient is Spanish-speaking only.  The history and the plan was discussed with her daughter Anita Contreras over the phone. HISTORY OF PRESENT ILLNESS:  Anita Contreras is a 67 y.o. Hispanic female with medical history significant for end-stage renal disease on hemodialysis on TTS, type 2 diabetes mellitus with diabetic retinopathy, hypertension, dyslipidemia, peripheral neuropathy and CVA, who presented to the emergency room with acute onset of right leg weakness with pain with inability to ambulate.  She has been having hip pain for the last couple of days with inability to walk on her right hip.  She usually ambulates without assistance.  The patient is family does not believe that she had any falls.  She was also complaining of numbness in the right forearm past her AV fistula.  She denies any other paresthesias or focal muscle weakness.  No dysphagia or dysarthria.  No chest pain or palpitations.  No cough or wheezing or dyspnea.  No nausea or vomiting or abdominal pain.  She has not missed hemodialysis.  ED Course: When she came to the ER, BP was 176/53 with otherwise normal vital signs.  Labs revealed potassium of 5.6 and chloride 97 with BUN of 96 and creatinine of 9, calcium 8.2 and anion gap of 16 with phosphorus 4.8 and magnesium 2.9 with alk phos 166 and otherwise unremarkable LFTs.  CBC showed hemoglobin 12.5 and hematocrit 40.3 much better than previous levels. EKG as reviewed by me : EKG showed normal sinus rhythm with a rate of 69 with peaked T waves in V3 through V5. Imaging: Pelvic and right hip x-ray mild acute angulation of the  anterior right femoral head neck junction decreases suspicion for acute nondisplaced fracture however it was not seen on other views and therefore CT of the right hip was recommended.  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  Pelvic CT without contrast revealed possible osteolytic lesions with no evidence for fracture and no intraperitoneal abnormalities. Right upper extremity ultrasound revealed patent AV fistula with no steal syndrome. Right lower extremity venous Doppler was negative for DVT.  The patient will be admitted to an observation medical telemetry bed for further evaluation and management. PAST MEDICAL HISTORY:   Past Medical History:  Diagnosis Date   Acute cystitis with hematuria    Anemia    Cataracts, bilateral    CKD (chronic kidney disease)    Diabetic retinopathy, background ([place])    Dialysis patient ([place])    Tues, Thurs, Sat   Dizziness    Hyperlipidemia    Hypertension    Migraine variant    Neuropathy    OP (osteoporosis)    Personal history of noncompliance with medical treatment, presenting hazards to health    Steal syndrome as complication of dialysis access ([place])    Stroke ([place])    Type II diabetes mellitus with ophthalmic manifestations, uncontrolled ([place])    Vitamin D deficiency     PAST SURGICAL HISTORY:   Past Surgical History:  Procedure Laterality Date   A/V FISTULAGRAM Right 06/03/2018   Procedure: A/V FISTULAGRAM;  Surgeon: Algernon Huxley, MD;  Location:  [facility];  Service: Cardiovascular;  Laterality: Right;   A/V FISTULAGRAM Right 02/22/2019   Procedure: A/V FISTULAGRAM;  Surgeon: Algernon Huxley, MD;  Location: [facility];  Service: Cardiovascular;  Laterality: Right;   A/V FISTULAGRAM Right 08/09/2021   Procedure: A/V FISTULAGRAM;  Surgeon: Algernon Huxley, MD;  Location: [facility];  Service: Cardiovascular;  Laterality: Right;   A/V SHUNT INTERVENTION N/A 01/26/2018   Procedure: A/V SHUNT  INTERVENTION;  Surgeon: Algernon Huxley, MD;  Location: [facility];  Service: Cardiovascular;  Laterality: N/A;   A/V SHUNTOGRAM Right 01/26/2018   Procedure: A/V SHUNTOGRAM;  Surgeon: Algernon Huxley, MD;  Location: [facility];  Service: Cardiovascular;  Laterality: Right;   AV FISTULA PLACEMENT Right 10/10/2016   Procedure: ARTERIOVENOUS (AV) FISTULA CREATION ( BRACHIOCEPHALIC );  Surgeon: Algernon Huxley, MD;  Location: [facility];  Service: Vascular;  Laterality: Right;   DILATION AND CURETTAGE OF UTERUS     DISTAL REVASCULARIZATION AND INTERVAL LIGATION (DRIL) Right 11/08/2016   Procedure: DISTAL REVASCULARIZATION AND INTERVAL LIGATION (DRIL);  Surgeon: Algernon Huxley, MD;  Location: [facility];  Service: Vascular;  Laterality: Right;   PERIPHERAL VASCULAR CATHETERIZATION N/A 04/25/2016   Procedure: Dialysis/Perma Catheter Insertion;  Surgeon: Algernon Huxley, MD;  Location: [facility];  Service: Cardiovascular;  Laterality: N/A;   PERIPHERAL VASCULAR CATHETERIZATION Right 10/17/2016   Procedure: Upper Extremity Angiography;  Surgeon: Algernon Huxley, MD;  Location: [facility];  Service: Cardiovascular;  Laterality: Right;   UPPER EXTREMITY ANGIOGRAPHY Right 10/12/2018   Procedure: UPPER EXTREMITY ANGIOGRAPHY;  Surgeon: Algernon Huxley, MD;  Location: [facility];  Service: Cardiovascular;  Laterality: Right;    SOCIAL HISTORY:   Social History   Tobacco Use   Smoking status: Never   Smokeless tobacco: Never  Substance Use Topics   Alcohol use: No    FAMILY HISTORY:   Family History  Problem Relation Age of Onset   Hypertension Mother    Hyperlipidemia Mother     DRUG ALLERGIES:   Allergies  Allergen Reactions   Dextromethorphan-Guaifenesin     Other reaction(s): Itching of skin (finding)   Propofol Itching    Pt says " when getting anesthesia by body itches" unsure what type of anesthesia    REVIEW OF SYSTEMS:   ROS As per history of present  illness. All pertinent systems were reviewed above. Constitutional, HEENT, cardiovascular, respiratory, GI, GU, musculoskeletal, neuro, psychiatric, endocrine, integumentary and hematologic systems were reviewed and are otherwise negative/unremarkable except for positive findings mentioned above in the HPI.   MEDICATIONS AT HOME:   Prior to Admission medications   Medication Sig Start Date End Date Taking? Authorizing Provider  hydrALAZINE (APRESOLINE) 100 MG tablet Take 1 tablet (100 mg total) by mouth every 8 (eight) hours. 08/31/21  Yes Dessa Phi, DO  acetaminophen (TYLENOL) 500 MG tablet Take 1,000 mg by mouth every 6 (six) hours as needed for mild pain or headache.    [provider]  amLODipine (NORVASC) 10 MG tablet Take 1 tablet (10 mg total) by mouth daily. 01/27/18   Bettey Costa, MD  aspirin 81 MG EC tablet TAKE 1 TABLET BY MOUTH EVERY DAY Patient not taking: Reported on 08/06/2021 04/15/17   Stegmayer, Janalyn Harder, PA-C  atorvastatin (LIPITOR) 40 MG tablet Take 40 mg by mouth daily at 6 PM.  Patient not taking: Reported on 08/06/2021    [provider]  calcium acetate (PHOSLO) 667 MG capsule Take 1 capsule (667 mg total) by mouth 3 (three) times daily with meals. Patient not taking: Reported on 08/06/2021 04/19/16   Fritzi Mandes, MD  hydrOXYzine (ATARAX/VISTARIL) 50 MG tablet Take 25 mg by mouth 2 (two) times daily as needed for itching.    [provider]  insulin NPH-regular Human (70-30) 100 UNIT/ML injection Inject 3 Units into the skin 2 (two) times daily with a meal.    [provider]  lidocaine-prilocaine (EMLA) cream Apply 1 application topically as needed. Patient not taking: Reported on 08/06/2021    [provider]  pantoprazole (PROTONIX) 40 MG tablet Take 1 tablet (40 mg total) by mouth daily. Patient not taking: Reported on 08/06/2021 08/12/20   Loletha Grayer, MD      VITAL SIGNS:  Blood pressure (!) 198/72, pulse 67,  temperature 98.5 F (36.9 C), temperature source Oral, resp. rate 19, height _0  (1.575 m), weight 50 kg, last menstrual period 09/19/2001, SpO2 97 %.  PHYSICAL EXAMINATION:  Physical Exam  GENERAL:  67 y.o.-year-old Hispanic female patient lying in the bed with no acute distress.  EYES: Pupils equal, round, reactive to light and accommodation. No scleral icterus. Extraocular muscles intact.  HEENT: Head atraumatic, normocephalic. Oropharynx and nasopharynx clear.  NECK:  Supple, no jugular venous distention. No thyroid enlargement, no tenderness.  LUNGS: Normal breath sounds bilaterally, no wheezing, rales,rhonchi or crepitation. No use of accessory muscles of respiration.  CARDIOVASCULAR: Regular rate and rhythm, S1, S2 normal. No murmurs, rubs, or gallops.  ABDOMEN: Soft, nondistended, nontender. Bowel sounds present. No organomegaly or mass.  EXTREMITIES: No pedal edema, cyanosis, or clubbing.  NEUROLOGIC: Cranial nerves II through XII are intact. Muscle strength 5/5 in all extremities. Sensation intact. Gait not checked.  PSYCHIATRIC: The patient is alert and oriented x 3.  Normal affect and good eye contact. SKIN: No obvious rash, lesion, or ulcer.   LABORATORY PANEL:   CBC Recent Labs  Lab 12/09/22 1842  WBC 7.1  HGB 12.5  HCT 40.3  PLT 197   ------------------------------------------------------------------------------------------------------------------  Chemistries  Recent Labs  Lab 12/09/22 1842  NA 136  K 5.6*  CL 97*  CO2 23  GLUCOSE 128*  BUN 96*  CREATININE 9.00*  CALCIUM 8.2*  MG 2.9*  AST 21  ALT 12  ALKPHOS 166*  BILITOT 0.7   ------------------------------------------------------------------------------------------------------------------  Cardiac Enzymes No results for input(s): "TROPONINI" in the last 168 hours. ------------------------------------------------------------------------------------------------------------------  RADIOLOGY:   CT PELVIS WO CONTRAST  Result Date: 12/09/2022 CLINICAL DATA:  Unable to walk EXAM: CT PELVIS WITHOUT CONTRAST TECHNIQUE: Multidetector CT imaging of the pelvis was performed following the standard protocol without intravenous contrast. RADIATION DOSE REDUCTION: This exam was performed according to the departmental dose-optimization program which includes automated exposure control, adjustment of the mA and/or kV according to patient size and/or use of iterative reconstruction technique. COMPARISON:  02/24/2018 FINDINGS: Urinary Tract: Mild urinary bladder wall thickening consistent with hypertrophy or inflammation. Bowel:  Unremarkable visualized pelvic bowel loops. Vascular/Lymphatic: Dense atheromatous calcifications. No adenopathy identified. Reproductive:  Pelvic organs are unremarkable. Musculoskeletal: Possible osteolytic changes with numerous small punctate areas of lucency in the pelvis. Consider correlation with bone survey. No traumatic osseous abnormalities are identified. IMPRESSION: 1. No acute traumatic abnormalities. 2. No intraperitoneal abnormalities. 3. Possible osteolytic lesions. Correlation with bone survey may be helpful. Electronically Signed   By: Sammie Bench M.D.   On: 12/09/2022 20:15   [place] Upper Ext Art  Right  Result Date: 12/09/2022 CLINICAL DATA: Right arm numbness beyond arteriovenous fistula EXAM: RIGHT UPPER EXTREMITY ARTERIAL DUPLEX SCAN TECHNIQUE: Gray-scale sonography as well as color Doppler and duplex ultrasound was performed to evaluate the arteries of the upper extremity. COMPARISON:  None Available. FINDINGS: Right subclavian and axillary artery are well visualized with monophasic waveforms although widely patent. Brachial artery demonstrates patency with monophasic waveform. Brachial artery beyond the fistula demonstrates to and fro flow consistent with a patent fistula. Radial and ulnar arteries are patent. Arteriovenous fistula is patent at the anastomosis with  normal flow. Stents are seen within the outflow vein similar to that noted on prior plain film. IMPRESSION: Patent arteriovenous fistula. The arterial structures beyond the fistula demonstrates some mild to and fro flow although no true steal is identified Electronically Signed   By: Inez Catalina M.D.   On: 12/09/2022 19:43   DG Hip Unilat With Pelvis 2-3 Views Right  Result Date: 12/09/2022 CLINICAL DATA:  Right hip pain. Right groin to thigh pain for the past week. Unable to walk this past week. EXAM: DG HIP (WITH OR WITHOUT PELVIS) 2-3V RIGHT COMPARISON:  CT abdomen and pelvis 04/26/2018 FINDINGS: Moderate bilateral superomedial femoroacetabular joint space narrowing. On lateral view there is mild acute angulation of the anterior right femoral head-neck junction that raises suspicion for an acute nondisplaced fracture. However, no fracture is visualized within the proximal right femur on the provided frontal views of the right hip or pelvis. Mild bilateral sacroiliac subchondral sclerosis. The pubic symphysis joint space is maintained. IMPRESSION: 1. On lateral view there is mild acute angulation of the anterior right femoral head-neck junction that raises suspicion for an acute nondisplaced fracture. However, this is not seen on other views. Recommend clinical correlation. If clinically indicated, CT of the right hip may help further evaluate. 2. Moderate bilateral femoroacetabular osteoarthritis. Electronically Signed   By: Yvonne Kendall M.D.   On: 12/09/2022 18:26   US Venous Img Lower Unilateral Right  Result Date: 12/09/2022 CLINICAL DATA:  Pain EXAM: RIGHT LOWER EXTREMITY VENOUS DOPPLER ULTRASOUND TECHNIQUE: Gray-scale sonography with compression, as well as color and duplex ultrasound, were performed to evaluate the deep venous system(s) from the level of the common femoral vein through the popliteal and proximal calf veins. COMPARISON:  05/22/2016 FINDINGS: VENOUS Normal compressibility of the  common femoral, superficial femoral, and popliteal veins, as well as the visualized calf veins. Visualized portions of profunda femoral vein and great saphenous vein unremarkable. No filling defects to suggest DVT on grayscale or color Doppler imaging. Doppler waveforms show normal direction of venous flow, normal respiratory plasticity and response to augmentation. Limited views of the contralateral common femoral vein are unremarkable. OTHER None. Limitations: none IMPRESSION: Negative. Electronically Signed   By: Sammie Bench M.D.   On: 12/09/2022 17:32   CT Head Wo Contrast  Result Date: 12/09/2022 CLINICAL DATA:  Neuro deficit, acute, stroke suspected EXAM: CT HEAD WITHOUT CONTRAST TECHNIQUE: Contiguous axial images were obtained from the base of the skull through the vertex without intravenous contrast. RADIATION DOSE REDUCTION: This exam was performed according to the departmental dose-optimization program which includes automated exposure control, adjustment of the mA and/or kV according to patient size and/or use of iterative reconstruction technique. COMPARISON:  01/26/2018. FINDINGS: Brain: There is periventricular white matter decreased attenuation consistent with small vessel ischemic changes. Ventricles, sulci and cisterns are prominent consistent with age related involutional changes. No acute intracranial hemorrhage, mass effect or shift. No hydrocephalus. Vascular: No  hyperdense vessel or unexpected calcification. Skull: Normal. Negative for fracture or focal lesion. Sinuses/Orbits: No acute finding. IMPRESSION: Atrophy and chronic small vessel ischemic changes. No acute intracranial process identified. Electronically Signed   By: Sammie Bench M.D.   On: 12/09/2022 17:31      IMPRESSION AND PLAN:  Assessment and Plan: * Unable to ambulate - The patient will be admitted to a medical telemetry observation bed. - The patient has associated right leg weakness and right leg and hip  pain with no evidence for fracture or DVT. - Will obtain a PT consult to assess her ambulation. - Abnormal pelvic CT findings of possible pelvic osteolytic lesions can be later investigated with bone survey is recommended.  Outpatient oncology consult can be obtained.  Will defer to morning hospitalist to place an inpatient consult if she is not getting better with ambulation with PT. - Will obtain neurochecks every 4 hours for 24 hours.  ESRD on hemodialysis [facility]) - Nephrology consult will be obtained. - I notified Dr. Holley Raring about the patient. - Will manage her hyperkalemia with p.o. Veltassa for now.  Dyslipidemia - We will continue statin therapy.  GERD without esophagitis - We will continue her PPI therapy.  Type 2 diabetes mellitus with diabetic retinopathy ([place]) - She will be placed on supplement coverage with NovoLog. - We will continue her basal coverage.  Essential hypertension - We will continue her antihypertensives.   DVT prophylaxis: Lovenox.  Advanced Care Planning:  Code Status: full code.  Family Communication:  The plan of care was discussed in details with the patient (and family). I answered all questions. The patient agreed to proceed with the above mentioned plan. Further management will depend upon hospital course. Disposition Plan: Back to previous home environment Consults called: Nephrology. All the records are reviewed and case discussed with ED provider.  Status is: Observation  I certify that at the time of admission, it is my clinical judgment that the patient will require  hospital care extending less than 2 midnights.                            Dispo: The patient is from: Home              Anticipated d/c is to: Home              Patient currently is not medically stable to d/c.              Difficult to place patient: No  Christel Mormon M.D on 12/09/2022 at 10:27 PM  [facility]   From 7 PM-7 AM, contact  night-coverage [email or web address]  CC: Primary care physician; Frazier Richards, MD

## 2022-12-09 NOTE — Assessment & Plan Note (Signed)
Patient is on TTS schedule.  Nephrology was consulted and she was taken for dialysis this morning.  No history of missed dialysis

## 2022-12-09 NOTE — ED Notes (Signed)
First encounter with the pt, translator services Minna Merritts [phone]) used to assist with the pt assessment. The pt has been transferred from the stretcher to a hospital bed. The pt states she is unable to stand or ambulate due to the pain and weakness in her right leg. The pt denies any pain while lying in the bed, but states she's unable to bear any weight on the leg at this time. The pt has been placed on the cardiac monitor and is in no acute visible distress at this time close monitoring continued.

## 2022-12-09 NOTE — Assessment & Plan Note (Addendum)
-   The patient will be admitted to a medical telemetry observation bed. - The patient has associated right leg weakness and right leg and hip pain with no evidence for fracture or DVT. - Will obtain a PT consult to assess her ambulation. - Abnormal pelvic CT findings of possible pelvic osteolytic lesions can be later investigated with bone survey is recommended.  Outpatient oncology consult can be obtained.  Will defer to morning hospitalist to place an inpatient consult if she is not getting better with ambulation with PT. - Will obtain neurochecks every 4 hours for 24 hours.

## 2022-12-09 NOTE — ED Provider Notes (Signed)
[facility] Provider Note    Event Date/Time   First MD Initiated Contact with Patient 12/09/22 1645     (approximate)   History   Leg Pain Offered formal translating service however patient's daughter would like to translate for her, Spanish-speaking  HPI  Anita Contreras is a 67 y.o. female past medical history significant for ESRD on HD TTSa, prior CVA with no residual deficits, presents to the emergency department with right leg pain and weakness.  States that she has been having hip pain over the past couple of days and has not been able to walk on her right hip.  Normally ambulates on her own without any difficulties.  Patient's daughter does not believe that she has had any falls.  Also complaining of numbness sensation in her right forearm.  States that it is only numb past her fistula.  States that it sometimes becomes numb and has paresthesias when she does dialysis.  Denies headache or change in vision.  Denies abdominal pain or chest pain.  Does not miss any dialysis sessions.   Physical Exam   Triage Vital Signs: ED Triage Vitals  Enc Vitals Group     BP 12/09/22 1501 (!) 176/53     Pulse Rate 12/09/22 1501 76     Resp --      Temp 12/09/22 1501 98 F (36.7 C)     Temp Source 12/09/22 1501 Oral     SpO2 12/09/22 1501 94 %     Weight 12/09/22 1455 110 lb 3.7 oz (50 kg)     Height 12/09/22 1455 5\' 2"  (1.575 m)     Head Circumference --      Peak Flow --      Pain Score 12/09/22 1453 7     Pain Loc --      Pain Edu? --      Excl. in [place]? --     Most recent vital signs: Vitals:   12/09/22 1501  BP: (!) 176/53  Pulse: 76  Temp: 98 F (36.7 C)  SpO2: 94%    Physical Exam Constitutional:      Appearance: She is well-developed.  HENT:     Head: Atraumatic.  Eyes:     Conjunctiva/sclera: Conjunctivae normal.  Cardiovascular:     Rate and Rhythm: Regular rhythm.  Pulmonary:     Effort: No respiratory distress.   Abdominal:     General: There is no distension.  Musculoskeletal:        General: Normal range of motion.     Cervical back: Normal range of motion.  Skin:    General: Skin is warm.     Comments: Right AV fistula with palpable thrill  Neurological:     Mental Status: She is alert. Mental status is at baseline.     Comments: Cranial nerves intact.  5/5 strength bilateral upper extremities.  Decreased sensation past the AV fistula however sensation intact proximal to the fistula.  Sensation intact bilateral lower extremities.  When attempting to stand unable to bear any weight with weakness to the right leg.      IMPRESSION / MDM / ASSESSMENT AND PLAN / ED COURSE  I reviewed the triage vital signs and the nursing notes.  Differential diagnosis include hip fracture, dislocation, musculoskeletal strain, CVA, intracranial hemorrhage, steal syndrome, electrolyte abnormalities   RADIOLOGY I independently reviewed imaging, my interpretation of imaging: CT head showed no signs of intracranial hemorrhage or infarction.  Ultrasound of the  right upper extremity showed no signs of a steal syndrome that were obvious but did show some decreased flow distally. X-ray imaging reading possible occult fracture to the right hip.  CT scan pelvis without contrast ordered to further evaluate.   Labs (all labs ordered are listed, but only abnormal results are displayed) Labs interpreted as -   Lab work without need for emergent dialysis.  Creatinine appears to be at her baseline.  Mildly elevated potassium.  Phosphorus and magnesium level elevated.   Labs Reviewed  CBC - Abnormal; Notable for the following components:      Result Value   MCV 102.3 (*)    RDW 15.9 (*)    All other components within normal limits  COMPREHENSIVE METABOLIC PANEL - Abnormal; Notable for the following components:   Potassium 5.6 (*)    Chloride 97 (*)    Glucose, Bld 128 (*)    BUN 96 (*)    Creatinine, Ser 9.00 (*)     Calcium 8.2 (*)    Alkaline Phosphatase 166 (*)    GFR, Estimated 4 (*)    Anion gap 16 (*)    All other components within normal limits  MAGNESIUM - Abnormal; Notable for the following components:   Magnesium 2.9 (*)    All other components within normal limits  PHOSPHORUS - Abnormal; Notable for the following components:   Phosphorus 4.8 (*)    All other components within normal limits    Concerned the patient will need admission given that she is unable to bear any weight or ambulate on the right leg.  Concerned that it could be either from occult hip fracture versus CVA.     PROCEDURES:  Critical Care performed: No  Procedures  Patient's presentation is most consistent with acute presentation with potential threat to life or bodily function.   MEDICATIONS ORDERED IN ED: Medications - No data to display  FINAL CLINICAL IMPRESSION(S) / ED DIAGNOSES   Final diagnoses:  Right leg pain  Right leg weakness     Rx / DC Orders   ED Discharge Orders     None        Note:  This document was prepared using Dragon voice recognition software and may include unintentional dictation errors.   Nathaniel Man, MD 12/09/22 2023

## 2022-12-09 NOTE — Assessment & Plan Note (Signed)
-   We will continue her PPI therapy.

## 2022-12-09 NOTE — ED Notes (Signed)
18g IV cannula noted to left ac. Majority of it IV cannula out. Flushed with NS flush and patient began having pain and small swelling at site. IV cannula taken out and bandage applied. Cath tip intact. Bleeding controlled

## 2022-12-09 NOTE — Assessment & Plan Note (Addendum)
Patient was on 70/30 at home. -SSI with mealtime coverage

## 2022-12-09 NOTE — Assessment & Plan Note (Signed)
-   We will continue statin therapy. 

## 2022-12-09 NOTE — ED Provider Notes (Signed)
-----------------------------------------   8:01 PM on 12/09/2022 -----------------------------------------  Blood pressure (!) 176/53, pulse 76, temperature 98 F (36.7 C), temperature source Oral, height 5\' 2"  (1.575 m), weight 50 kg, last menstrual period 09/19/2001, SpO2 94 %.  Assuming care from Dr. Jori Moll.  In short, Anita Contreras is a 67 y.o. female with a chief complaint of Leg Pain .  Refer to the original H&P for additional details.  The current plan of care is to await results of CT of pelvis and admit for inability to ambulate if CT is negative.   ----------------------------------------- 8:34 PM on 12/09/2022 -----------------------------------------  CT is negative. Will request admission via hospitalist service.   Victorino Dike, FNP 12/10/22 Earlene Plater, MD 12/14/22 [phone]

## 2022-12-09 NOTE — ED Notes (Signed)
Spoke with [name], pharmacy regarding Veltassa. States medication can be given now PO but it may decrease effectiveness of hydralazine

## 2022-12-09 NOTE — ED Triage Notes (Signed)
Pt reports pain to her right leg from her groin to her thigh for the last week. Pt denies injuries. Pt daughter reports pt is usually abel to walk but has not been able to the past week

## 2022-12-09 NOTE — ED Notes (Signed)
One unsuccessful IV attempt by this Probation officer. Dr. Jori Moll aware and asked for ultrasound machine to be brought to bedside.

## 2022-12-10 ENCOUNTER — Encounter: Payer: Self-pay | Admitting: Family Medicine

## 2022-12-10 DIAGNOSIS — K219 Gastro-esophageal reflux disease without esophagitis: Secondary | ICD-10-CM

## 2022-12-10 DIAGNOSIS — Z794 Long term (current) use of insulin: Secondary | ICD-10-CM

## 2022-12-10 DIAGNOSIS — I10 Essential (primary) hypertension: Secondary | ICD-10-CM

## 2022-12-10 DIAGNOSIS — E11319 Type 2 diabetes mellitus with unspecified diabetic retinopathy without macular edema: Secondary | ICD-10-CM

## 2022-12-10 LAB — GLUCOSE, CAPILLARY
Glucose-Capillary: 85 mg/dL (ref 70–99)
Glucose-Capillary: 83 mg/dL (ref 70–99)
Glucose-Capillary: 141 mg/dL — ABNORMAL HIGH (ref 70–99)
Glucose-Capillary: 177 mg/dL — ABNORMAL HIGH (ref 70–99)

## 2022-12-10 LAB — HEMOGLOBIN A1C
Hgb A1c MFr Bld: 5.1 % (ref 4.8–5.6)
Mean Plasma Glucose: 100 mg/dL

## 2022-12-10 LAB — CBC
HCT: 35.6 % — ABNORMAL LOW (ref 36.0–46.0)
Hemoglobin: 11.2 g/dL — ABNORMAL LOW (ref 12.0–15.0)
MCH: 32.3 pg (ref 26.0–34.0)
MCHC: 31.5 g/dL (ref 30.0–36.0)
MCV: 102.6 fL — ABNORMAL HIGH (ref 80.0–100.0)
Platelets: 178 10*3/uL (ref 150–400)
RBC: 3.47 MIL/uL — ABNORMAL LOW (ref 3.87–5.11)
RDW: 15.9 % — ABNORMAL HIGH (ref 11.5–15.5)
WBC: 6.1 10*3/uL (ref 4.0–10.5)
nRBC: 0 % (ref 0.0–0.2)

## 2022-12-10 LAB — BASIC METABOLIC PANEL
Anion gap: 14 (ref 5–15)
BUN: 97 mg/dL — ABNORMAL HIGH (ref 8–23)
CO2: 23 mmol/L (ref 22–32)
Calcium: 7.8 mg/dL — ABNORMAL LOW (ref 8.9–10.3)
Chloride: 102 mmol/L (ref 98–111)
Creatinine, Ser: 9.7 mg/dL — ABNORMAL HIGH (ref 0.44–1.00)
GFR, Estimated: 4 mL/min — ABNORMAL LOW (ref >60)
Glucose, Bld: 81 mg/dL (ref 70–99)
Potassium: 6.1 mmol/L — ABNORMAL HIGH (ref 3.5–5.1)
Sodium: 139 mmol/L (ref 135–145)

## 2022-12-10 LAB — HEPATITIS B SURFACE ANTIGEN: Hepatitis B Surface Ag: NONREACTIVE

## 2022-12-10 LAB — HIV ANTIBODY (ROUTINE TESTING W REFLEX): HIV Screen 4th Generation wRfx: NONREACTIVE

## 2022-12-10 MED ORDER — HEPARIN SODIUM (PORCINE) 1000 UNIT/ML DIALYSIS: 1000.0000 [IU] | INTRAMUSCULAR | Status: DC | PRN

## 2022-12-10 MED ORDER — ATORVASTATIN CALCIUM 20 MG PO TABS
40.0000 mg | ORAL_TABLET | Freq: Every day | ORAL | Status: DC
  Administered 2022-12-10: 40 mg via ORAL
  Filled 2022-12-10: qty 2

## 2022-12-10 MED ORDER — CALCIUM GLUCONATE-NACL 1-0.675 GM/50ML-% IV SOLN
1.0000 g | Freq: Once | INTRAVENOUS | Status: AC
  Administered 2022-12-10: 1000 mg via INTRAVENOUS
  Filled 2022-12-10: qty 50

## 2022-12-10 MED ORDER — PANTOPRAZOLE SODIUM 40 MG PO TBEC
40.0000 mg | DELAYED_RELEASE_TABLET | Freq: Every day | ORAL | Status: DC
  Administered 2022-12-11: 40 mg via ORAL
  Filled 2022-12-10: qty 1

## 2022-12-10 MED ORDER — INSULIN ASPART 100 UNIT/ML IJ SOLN: 0.0000 [IU] | Freq: Three times a day (TID) | INTRAMUSCULAR | Status: DC

## 2022-12-10 MED ORDER — INSULIN ASPART 100 UNIT/ML IJ SOLN
2.0000 [IU] | Freq: Three times a day (TID) | INTRAMUSCULAR | Status: DC
  Administered 2022-12-11: 2 [IU] via SUBCUTANEOUS
  Filled 2022-12-10: qty 1

## 2022-12-10 MED ORDER — ANTICOAGULANT SODIUM CITRATE 4% (200MG/5ML) IV SOLN: 5.0000 mL | Status: DC | PRN

## 2022-12-10 MED ORDER — ALTEPLASE 2 MG IJ SOLR: 2.0000 mg | Freq: Once | INTRAMUSCULAR | Status: DC | PRN

## 2022-12-10 MED ORDER — LIDOCAINE HCL (PF) 1 % IJ SOLN: 5.0000 mL | INTRAMUSCULAR | Status: DC | PRN

## 2022-12-10 MED ORDER — CALCIUM ACETATE (PHOS BINDER) 667 MG PO CAPS
667.0000 mg | ORAL_CAPSULE | Freq: Three times a day (TID) | ORAL | Status: DC
  Administered 2022-12-10 – 2022-12-11 (×3): 667 mg via ORAL
  Filled 2022-12-10 (×4): qty 1

## 2022-12-10 MED ORDER — CHLORHEXIDINE GLUCONATE CLOTH 2 % EX PADS
6.0000 | MEDICATED_PAD | Freq: Every day | CUTANEOUS | Status: DC
  Administered 2022-12-10 – 2022-12-11 (×2): 6 via TOPICAL

## 2022-12-10 MED ORDER — ASPIRIN 81 MG PO TBEC
81.0000 mg | DELAYED_RELEASE_TABLET | Freq: Every day | ORAL | Status: DC
  Administered 2022-12-10 – 2022-12-11 (×2): 81 mg via ORAL
  Filled 2022-12-10 (×2): qty 1

## 2022-12-10 MED ORDER — LIDOCAINE-PRILOCAINE 2.5-2.5 % EX CREA: 1.0000 | TOPICAL_CREAM | CUTANEOUS | Status: DC | PRN

## 2022-12-10 MED ORDER — PENTAFLUOROPROP-TETRAFLUOROETH EX AERO: 1.0000 | INHALATION_SPRAY | CUTANEOUS | Status: DC | PRN

## 2022-12-10 MED ORDER — INSULIN ASPART 100 UNIT/ML IJ SOLN: 0.0000 [IU] | Freq: Every day | INTRAMUSCULAR | Status: DC

## 2022-12-10 NOTE — Progress Notes (Signed)
Progress Note   Patient: Anita Contreras MCN:[id number] DOB: 28-May-1956 DOA: 12/09/2022     0 DOS: the patient was seen and examined on 12/10/2022   Brief hospital course: Taken from H&P.  Anita Contreras is a 67 y.o. Hispanic female with medical history significant for end-stage renal disease on hemodialysis on TTS, type 2 diabetes mellitus with diabetic retinopathy, hypertension, dyslipidemia, peripheral neuropathy and CVA, who presented to the emergency room with acute onset of right leg weakness with pain with inability to ambulate for the past couple of days.  No history of falls.  Also had numbness in the right forearm past her AV fistula. ED Course: When she came to the ER, BP was 176/53 with otherwise normal vital signs.  Labs revealed potassium of 5.6 and chloride 97 with BUN of 96 and creatinine of 9, calcium 8.2 and anion gap of 16 with phosphorus 4.8 and magnesium 2.9 with alk phos 166 and otherwise unremarkable LFTs.  CBC showed hemoglobin 12.5 and hematocrit 40.3 much better than previous levels.  EKG showed normal sinus rhythm with a rate of 69 with peaked T waves in V3 through V5. Imaging: Pelvic and right hip x-ray mild acute angulation of the anterior right femoral head neck junction decreases suspicion for acute nondisplaced fracture however it was not seen on other views and therefore CT of the right hip was recommended.  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  Pelvic CT without contrast revealed possible osteolytic lesions with no evidence for fracture and no intraperitoneal abnormalities. Right upper extremity ultrasound revealed patent AV fistula with no steal syndrome. Right lower extremity venous Doppler was negative for DVT.  1/2: Hemodynamically stable.  Labs pertinent for potassium of 6.1.  Ordered 1 dose of calcium gluconate and nephrology was notified for dialysis, she was taken for dialysis. Pending PT/OT  evaluation      Assessment and Plan: * Unable to ambulate - The patient has associated right leg weakness and right leg and hip pain with no evidence for fracture or DVT.  CT head with no acute intracranial abnormality - Abnormal pelvic CT findings of possible pelvic osteolytic lesions can be later investigated with bone survey is recommended.  Outpatient oncology consult can be obtained.  - PT/OT evaluation  Hyperkalemia Worsening hyperkalemia with potassium of 6.1 this morning despite getting Veltassa.  EKG with peaked T waves and multiple leads She was taken for dialysis. -Monitor potassium  ESRD on hemodialysis [facility]) Patient is on TTS schedule.  Nephrology was consulted and she was taken for dialysis this morning.  No history of missed dialysis  Essential hypertension Blood pressure mildly elevated. - Continue home amlodipine and hydralazine.  Type 2 diabetes mellitus with diabetic retinopathy ([place]) Patient was on 70/30 at home. -SSI with mealtime coverage  Dyslipidemia - We will continue statin therapy.  GERD without esophagitis - We will continue her PPI therapy.   Subjective: Patient was seen during dialysis.  Denies any pain at that time.  Physical Exam: Vitals:   12/10/22 1200 12/10/22 1230 12/10/22 1300 12/10/22 1330  BP: (!) 170/61 (!) 171/62 (!) 179/65 (!) 160/49  Pulse: 60 64 62 62  Resp: _0 Temp:      TempSrc:      SpO2: 96% 96% 98% 98%  Weight:      Height:       General.  Malnourished lady, in no acute distress. Pulmonary.  Lungs clear bilaterally, normal respiratory effort. CV.  Regular rate and rhythm,  no JVD, rub or murmur. Abdomen.  Soft, nontender, nondistended, BS positive. CNS.  Alert and oriented .  No focal neurologic deficit. Extremities.  No edema, no cyanosis, pulses intact and symmetrical. Psychiatry.  Judgment and insight appears normal.   Data Reviewed: Prior data reviewed  Family Communication: Unable to reach any  daughter on phone.  Disposition: Status is: Observation The patient remains OBS appropriate and will d/c before 2 midnights.  Planned Discharge Destination: Home  DVT prophylaxis.  Lovenox Time spent: 50 minutes  This record has been created using Systems analyst. Errors have been sought and corrected,but may not always be located. Such creation errors do not reflect on the standard of care.   Author: Lorella Nimrod, MD 12/10/2022 1:53 PM  For on call review [email or web address].

## 2022-12-10 NOTE — Assessment & Plan Note (Signed)
Worsening hyperkalemia with potassium of 6.1 this morning despite getting Veltassa.  EKG with peaked T waves and multiple leads She was taken for dialysis. -Monitor potassium

## 2022-12-10 NOTE — Progress Notes (Signed)
[facility]  ROUNDING NOTE   Subjective:   Anita Contreras  is a 67 y.o. spanish speaking female with past medical history of hypertension, dyslipidemia, type 2 diabetes with diabetic retinopathy, CVA, peripheral neuropathy, and end-stage renal disease on hemodialysis.  Patient is known to [place] from previous admissions. She presents for leg pain. Last dialysis treatment was on Saturday.   Patient currently receives outpatient dialysis treatments at [facility] on a TTS schedule, supervised by [facility] physicians.  Patient states she has been having bilateral lower extremity pain since this weekend.  According to chart review, patient's daughter states she has been unable to ambulate for a week.  This interview was conducted using video speech interpreter.  Currently denies pain or discomfort at this time however complains of weakness.  No recent sick contacts or illness.  No known fever or chills.  Denies recent falls.  Does report some right forearm numbness.  Denies nausea, vomiting, or diarrhea.  Labs on ED arrival significant for potassium 5.6, glucose 128, BUN 96, creatinine 9.00, calcium 8.2, magnesium 2.9, and hemoglobin 12.5.  Pelvis CT negative for acute changes, possible osteolytic lesions with recommendation for bone survey.  Upper extremity ultrasound negative for steal syndrome.  CT head shows age-related changes.  Right hip x-ray suspicious for acute nondisplaced fracture, recommending physical assessment.  We have been consulted to continue dialysis during this admission.   Objective:  Vital signs in last 24 hours:  Temp:  [98 F (36.7 C)-98.7 F (37.1 C)] 98.1 F (36.7 C) (01/02 1014) Pulse Rate:  [60-76] 64 (01/02 1230) Resp:  [14-20] 18 (01/02 1230) BP: (136-216)/(52-72) 171/62 (01/02 1230) SpO2:  [94 %-99 %] 96 % (01/02 1230) Weight:  [47.2 kg-50 kg] 47.2 kg (01/02 1030)  Weight change:  Filed Weights   12/09/22 1455 12/10/22 1030  Weight: 50 kg  47.2 kg    Intake/Output: No intake/output data recorded.   Intake/Output this shift:  No intake/output data recorded.  Physical Exam: General: NAD, resting comfortably  Head: Normocephalic, atraumatic. Moist oral mucosal membranes  Eyes: Anicteric  Lungs:  Clear to auscultation, normal effort, room air  Heart: Regular rate and rhythm  Abdomen:  Soft, nontender, nondistended  Extremities: No peripheral edema.  Neurologic: Alert and oriented, moving all four extremities  Skin: No lesions  Access: Left upper aVF    Basic Metabolic Panel: Recent Labs  Lab 12/09/22 1842 12/10/22 0603  NA 136 139  K 5.6* 6.1*  CL 97* 102  CO2 23 23  GLUCOSE 128* 81  BUN 96* 97*  CREATININE 9.00* 9.70*  CALCIUM 8.2* 7.8*  MG 2.9*  --   PHOS 4.8*  --     Liver Function Tests: Recent Labs  Lab 12/09/22 1842  AST 21  ALT 12  ALKPHOS 166*  BILITOT 0.7  PROT 7.0  ALBUMIN 4.0   No results for input(s): "LIPASE", "AMYLASE" in the last 168 hours. No results for input(s): "AMMONIA" in the last 168 hours.  CBC: Recent Labs  Lab 12/09/22 1842 12/10/22 0603  WBC 7.1 6.1  HGB 12.5 11.2*  HCT 40.3 35.6*  MCV 102.3* 102.6*  PLT 197 178    Cardiac Enzymes: No results for input(s): "CKTOTAL", "CKMB", "CKMBINDEX", "TROPONINI" in the last 168 hours.  BNP: Invalid input(s): "POCBNP"  CBG: Recent Labs  Lab 12/10/22 0822  GLUCAP 18    Microbiology: Results for orders placed or performed during the hospital encounter of 08/27/21  Resp Panel by RT-PCR (Flu A&B, Covid)  Nasopharyngeal Swab     Status: None   Collection Time: 08/27/21  9:11 PM   Specimen: Nasopharyngeal Swab; Nasopharyngeal(NP) swabs in vial transport medium  Result Value Ref Range Status   SARS Coronavirus 2 by RT PCR NEGATIVE NEGATIVE Final    Comment: (NOTE) SARS-CoV-2 target nucleic acids are NOT DETECTED.  The SARS-CoV-2 RNA is generally detectable in upper respiratory specimens during the acute phase of  infection. The lowest concentration of SARS-CoV-2 viral copies this assay can detect is 138 copies/mL. A negative result does not preclude SARS-Cov-2 infection and should not be used as the sole basis for treatment or other patient management decisions. A negative result may occur with  improper specimen collection/handling, submission of specimen other than nasopharyngeal swab, presence of viral mutation(s) within the areas targeted by this assay, and inadequate number of viral copies(<138 copies/mL). A negative result must be combined with clinical observations, patient history, and epidemiological information. The expected result is Negative.  Fact Sheet for Patients:  EntrepreneurPulse.com.au  Fact Sheet for Healthcare Providers:  IncredibleEmployment.be  This test is no t yet approved or cleared by the [place] FDA and  has been authorized for detection and/or diagnosis of SARS-CoV-2 by FDA under an Emergency Use Authorization (EUA). This EUA will remain  in effect (meaning this test can be used) for the duration of the COVID-19 declaration under Section 564(b)(1) of the Act, 21 U.S.C.section 360bbb-3(b)(1), unless the authorization is terminated  or revoked sooner.       Influenza A by PCR NEGATIVE NEGATIVE Final   Influenza B by PCR NEGATIVE NEGATIVE Final    Comment: (NOTE) The Xpert Xpress SARS-CoV-2/FLU/RSV plus assay is intended as an aid in the diagnosis of influenza from Nasopharyngeal swab specimens and should not be used as a sole basis for treatment. Nasal washings and aspirates are unacceptable for Xpert Xpress SARS-CoV-2/FLU/RSV testing.  Fact Sheet for Patients: EntrepreneurPulse.com.au  Fact Sheet for Healthcare Providers: IncredibleEmployment.be  This test is not yet approved or cleared by the [place] FDA and has been authorized for detection and/or diagnosis of SARS-CoV-2  by FDA under an Emergency Use Authorization (EUA). This EUA will remain in effect (meaning this test can be used) for the duration of the COVID-19 declaration under Section 564(b)(1) of the Act, 21 U.S.C. section 360bbb-3(b)(1), unless the authorization is terminated or revoked.  Performed at [facility], [place], [address] [place], [place] [address]   MRSA Next Gen by PCR, Nasal     Status: None   Collection Time: 08/28/21  1:08 AM   Specimen: Nasal Mucosa; Nasal Swab  Result Value Ref Range Status   MRSA by PCR Next Gen NOT DETECTED NOT DETECTED Final    Comment: (NOTE) The GeneXpert MRSA Assay (FDA approved for NASAL specimens only), is one component of a comprehensive MRSA colonization surveillance program. It is not intended to diagnose MRSA infection nor to guide or monitor treatment for MRSA infections. Test performance is not FDA approved in patients less than 61 years old. Performed at [facility], [address] [place], [place] [address]   CULTURE, BLOOD (ROUTINE X 2) w Reflex to ID Panel     Status: None   Collection Time: 08/29/21 10:27 AM   Specimen: BLOOD  Result Value Ref Range Status   Specimen Description BLOOD  [facility]  Final   Special Requests   Final    BOTTLES DRAWN AEROBIC AND ANAEROBIC Blood Culture adequate volume   Culture   Final  NO GROWTH 5 DAYS Performed at [facility], [address] [place], [place] [address]    Report Status 09/03/2021 FINAL  Final  CULTURE, BLOOD (ROUTINE X 2) w Reflex to ID Panel     Status: None   Collection Time: 08/29/21 10:27 AM   Specimen: BLOOD  Result Value Ref Range Status   Specimen Description BLOOD  [facility]  Final   Special Requests   Final    BOTTLES DRAWN AEROBIC AND ANAEROBIC Blood Culture adequate volume   Culture   Final    NO GROWTH 5 DAYS Performed at [facility], [address] [place], [place] [address]    Report Status 09/03/2021 FINAL  Final    Coagulation  Studies: No results for input(s): "LABPROT", "INR" in the last 72 hours.  Urinalysis: No results for input(s): "COLORURINE", "LABSPEC", "PHURINE", "GLUCOSEU", "HGBUR", "BILIRUBINUR", "KETONESUR", "PROTEINUR", "UROBILINOGEN", "NITRITE", "LEUKOCYTESUR" in the last 72 hours.  Invalid input(s): "APPERANCEUR"    Imaging: CT PELVIS WO CONTRAST  Result Date: 12/09/2022 CLINICAL DATA:  Unable to walk EXAM: CT PELVIS WITHOUT CONTRAST TECHNIQUE: Multidetector CT imaging of the pelvis was performed following the standard protocol without intravenous contrast. RADIATION DOSE REDUCTION: This exam was performed according to the departmental dose-optimization program which includes automated exposure control, adjustment of the mA and/or kV according to patient size and/or use of iterative reconstruction technique. COMPARISON:  02/24/2018 FINDINGS: Urinary Tract: Mild urinary bladder wall thickening consistent with hypertrophy or inflammation. Bowel:  Unremarkable visualized pelvic bowel loops. Vascular/Lymphatic: Dense atheromatous calcifications. No adenopathy identified. Reproductive:  Pelvic organs are unremarkable. Musculoskeletal: Possible osteolytic changes with numerous small punctate areas of lucency in the pelvis. Consider correlation with bone survey. No traumatic osseous abnormalities are identified. IMPRESSION: 1. No acute traumatic abnormalities. 2. No intraperitoneal abnormalities. 3. Possible osteolytic lesions. Correlation with bone survey may be helpful. Electronically Signed   By: Sammie Bench M.D.   On: 12/09/2022 20:15   [place] Upper Ext Art Right  Result Date: 12/09/2022 CLINICAL DATA: Right arm numbness beyond arteriovenous fistula EXAM: RIGHT UPPER EXTREMITY ARTERIAL DUPLEX SCAN TECHNIQUE: Gray-scale sonography as well as color Doppler and duplex ultrasound was performed to evaluate the arteries of the upper extremity. COMPARISON:  None Available. FINDINGS: Right subclavian and axillary artery  are well visualized with monophasic waveforms although widely patent. Brachial artery demonstrates patency with monophasic waveform. Brachial artery beyond the fistula demonstrates to and fro flow consistent with a patent fistula. Radial and ulnar arteries are patent. Arteriovenous fistula is patent at the anastomosis with normal flow. Stents are seen within the outflow vein similar to that noted on prior plain film. IMPRESSION: Patent arteriovenous fistula. The arterial structures beyond the fistula demonstrates some mild to and fro flow although no true steal is identified Electronically Signed   By: Inez Catalina M.D.   On: 12/09/2022 19:43   DG Hip Unilat With Pelvis 2-3 Views Right  Result Date: 12/09/2022 CLINICAL DATA:  Right hip pain. Right groin to thigh pain for the past week. Unable to walk this past week. EXAM: DG HIP (WITH OR WITHOUT PELVIS) 2-3V RIGHT COMPARISON:  CT abdomen and pelvis 04/26/2018 FINDINGS: Moderate bilateral superomedial femoroacetabular joint space narrowing. On lateral view there is mild acute angulation of the anterior right femoral head-neck junction that raises suspicion for an acute nondisplaced fracture. However, no fracture is visualized within the proximal right femur on the provided frontal views of the right hip or pelvis. Mild bilateral sacroiliac subchondral sclerosis. The pubic  symphysis joint space is maintained. IMPRESSION: 1. On lateral view there is mild acute angulation of the anterior right femoral head-neck junction that raises suspicion for an acute nondisplaced fracture. However, this is not seen on other views. Recommend clinical correlation. If clinically indicated, CT of the right hip may help further evaluate. 2. Moderate bilateral femoroacetabular osteoarthritis. Electronically Signed   By: Yvonne Kendall M.D.   On: 12/09/2022 18:26   US Venous Img Lower Unilateral Right  Result Date: 12/09/2022 CLINICAL DATA:  Pain EXAM: RIGHT LOWER EXTREMITY VENOUS  DOPPLER ULTRASOUND TECHNIQUE: Gray-scale sonography with compression, as well as color and duplex ultrasound, were performed to evaluate the deep venous system(s) from the level of the common femoral vein through the popliteal and proximal calf veins. COMPARISON:  05/22/2016 FINDINGS: VENOUS Normal compressibility of the common femoral, superficial femoral, and popliteal veins, as well as the visualized calf veins. Visualized portions of profunda femoral vein and great saphenous vein unremarkable. No filling defects to suggest DVT on grayscale or color Doppler imaging. Doppler waveforms show normal direction of venous flow, normal respiratory plasticity and response to augmentation. Limited views of the contralateral common femoral vein are unremarkable. OTHER None. Limitations: none IMPRESSION: Negative. Electronically Signed   By: Sammie Bench M.D.   On: 12/09/2022 17:32   CT Head Wo Contrast  Result Date: 12/09/2022 CLINICAL DATA:  Neuro deficit, acute, stroke suspected EXAM: CT HEAD WITHOUT CONTRAST TECHNIQUE: Contiguous axial images were obtained from the base of the skull through the vertex without intravenous contrast. RADIATION DOSE REDUCTION: This exam was performed according to the departmental dose-optimization program which includes automated exposure control, adjustment of the mA and/or kV according to patient size and/or use of iterative reconstruction technique. COMPARISON:  01/26/2018. FINDINGS: Brain: There is periventricular white matter decreased attenuation consistent with small vessel ischemic changes. Ventricles, sulci and cisterns are prominent consistent with age related involutional changes. No acute intracranial hemorrhage, mass effect or shift. No hydrocephalus. Vascular: No hyperdense vessel or unexpected calcification. Skull: Normal. Negative for fracture or focal lesion. Sinuses/Orbits: No acute finding. IMPRESSION: Atrophy and chronic small vessel ischemic changes. No acute  intracranial process identified. Electronically Signed   By: Sammie Bench M.D.   On: 12/09/2022 17:31     Medications:    anticoagulant sodium citrate      amLODipine  10 mg Oral Daily   Chlorhexidine Gluconate Cloth  6 each Topical Q0600   enoxaparin (LOVENOX) injection  40 mg Subcutaneous Q24H   hydrALAZINE  100 mg Oral Q8H   insulin aspart  0-5 Units Subcutaneous QHS   insulin aspart  0-6 Units Subcutaneous TID WC   insulin aspart  2 Units Subcutaneous TID WC   acetaminophen **OR** acetaminophen, alteplase, anticoagulant sodium citrate, heparin, hydrOXYzine, lidocaine (PF), lidocaine-prilocaine, magnesium hydroxide, morphine injection, ondansetron **OR** ondansetron (ZOFRAN) IV, pentafluoroprop-tetrafluoroeth, traZODone  Assessment/ Plan:  Ms. Takyra Cantrall is a 67 y.o.  female with past medical history of hypertension, dyslipidemia, type 2 diabetes with diabetic retinopathy, CVA, peripheral neuropathy, and end-stage renal disease on hemodialysis.  Patient is known to [place] from previous admissions. She presents for leg pain. Last dialysis treatment was on Saturday.   Hyperkalemia with end-stage renal disease on hemodialysis.  Last treatment completed also on Friday.  Potassium on admission 5.6, with increased to 6.1 today.  Will provide dialysis today on 1K bath to manage this.  UF goal 1 L as tolerated.  Next treatment scheduled for Thursday.  Hepatitis B panel ordered per hospital  protocol.  2. Anemia of chronic kidney disease Lab Results  Component Value Date   HGB 11.2 (L) 12/10/2022  Hemoglobin above desired target.  Will continue to monitor. Patient receives Mircera during outpatient treatments.  3. Secondary Hyperparathyroidism: with outpatient labs: PTH 559, phosphorus 2.9, calcium 8.3 on 11/28/2022.   Lab Results  Component Value Date   PTH 183 (H) 11/09/2016   CALCIUM 7.8 (L) 12/10/2022   PHOS 4.8 (H) 12/09/2022    Currently prescribed calcitriol and  calcium acetate with meals outpatient.  Calcium slightly decreased.  Will continue to monitor for now.  4. Diabetes mellitus type II with chronic kidney disease/renal manifestations: insulin dependent. Home regimen includes NovoLog 70/30. Most recent hemoglobin A1c is 6.0 on 08/07/21.   .  LOS: 0 Anita Contreras 1/2/202412:47 PM

## 2022-12-10 NOTE — Progress Notes (Signed)
PT Cancellation Note  Patient Details Name: Araly Kaas MRN: [id number] DOB: [birth year]   Cancelled Treatment:    Reason Eval/Treat Not Completed: Patient at procedure or test/unavailable.  PT consult received.  Pt's most recent potassium this morning was 6.1 (up-trending).  Pt currently off floor at dialysis.  Will re-attempt PT evaluation at a later date/time as medically appropriate.  Leitha Bleak, PT 12/10/22, 11:22 AM

## 2022-12-10 NOTE — Progress Notes (Signed)
Hep B results from pulled from care everywhere. Will draw  Hep B labs before start of treatment

## 2022-12-10 NOTE — Progress Notes (Signed)
  Transition of Care (TOC) Screening Note   Patient Details  Name: Anita Contreras Date of Birth: [birth year]   Transition of Care [facility]) CM/SW Contact:    Quin Hoop, LCSW Phone Number: 12/10/2022, 3:23 PM    Transition of Care Department [facility]) has reviewed patient and no TOC needs have been identified at this time. We will continue to monitor patient advancement through interdisciplinary progression rounds. If new patient transition needs arise, please place a TOC consult.

## 2022-12-10 NOTE — Hospital Course (Addendum)
Taken from H&P.  Anita Contreras is a 67 y.o. Hispanic female with medical history significant for end-stage renal disease on hemodialysis on TTS, type 2 diabetes mellitus with diabetic retinopathy, hypertension, dyslipidemia, peripheral neuropathy and CVA, who presented to the emergency room with acute onset of right leg weakness with pain with inability to ambulate for the past couple of days.  No history of falls.  Also had numbness in the right forearm past her AV fistula. ED Course: When she came to the ER, BP was 176/53 with otherwise normal vital signs.  Labs revealed potassium of 5.6 and chloride 97 with BUN of 96 and creatinine of 9, calcium 8.2 and anion gap of 16 with phosphorus 4.8 and magnesium 2.9 with alk phos 166 and otherwise unremarkable LFTs.  CBC showed hemoglobin 12.5 and hematocrit 40.3 much better than previous levels.  EKG showed normal sinus rhythm with a rate of 69 with peaked T waves in V3 through V5. Imaging: Pelvic and right hip x-ray mild acute angulation of the anterior right femoral head neck junction decreases suspicion for acute nondisplaced fracture however it was not seen on other views and therefore CT of the right hip was recommended.  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  Pelvic CT without contrast revealed possible osteolytic lesions with no evidence for fracture and no intraperitoneal abnormalities. Right upper extremity ultrasound revealed patent AV fistula with no steal syndrome. Right lower extremity venous Doppler was negative for DVT.  1/2: Hemodynamically stable.  Labs pertinent for potassium of 6.1.  Ordered 1 dose of calcium gluconate and nephrology was notified for dialysis, she was taken for dialysis. Pending PT/OT evaluation  1/3.  Patient still complaining of some numbness down the right leg.  She was able to walk with physical therapy recommending home with home health.  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  This may affect her balance.  MRI of the lumbar spine showed a large broad-based disc protrusion at L4-L5 with mass effect on both sides of the thecal sac involving the L5 nerve roots.  Case discussed with Dr. Cari Caraway neurosurgery and recommended steroids and follow-up as outpatient.

## 2022-12-11 ENCOUNTER — Observation Stay: Payer: Self-pay

## 2022-12-11 DIAGNOSIS — E785 Hyperlipidemia, unspecified: Secondary | ICD-10-CM

## 2022-12-11 DIAGNOSIS — M5416 Radiculopathy, lumbar region: Secondary | ICD-10-CM

## 2022-12-11 LAB — RENAL FUNCTION PANEL
Albumin: 3.2 g/dL — ABNORMAL LOW (ref 3.5–5.0)
Anion gap: 12 (ref 5–15)
BUN: 35 mg/dL — ABNORMAL HIGH (ref 8–23)
CO2: 29 mmol/L (ref 22–32)
Calcium: 7.5 mg/dL — ABNORMAL LOW (ref 8.9–10.3)
Chloride: 94 mmol/L — ABNORMAL LOW (ref 98–111)
Creatinine, Ser: 4.82 mg/dL — ABNORMAL HIGH (ref 0.44–1.00)
GFR, Estimated: 9 mL/min — ABNORMAL LOW (ref >60)
Glucose, Bld: 92 mg/dL (ref 70–99)
Phosphorus: 4 mg/dL (ref 2.5–4.6)
Potassium: 4.4 mmol/L (ref 3.5–5.1)
Sodium: 135 mmol/L (ref 135–145)

## 2022-12-11 LAB — GLUCOSE, CAPILLARY
Glucose-Capillary: 83 mg/dL (ref 70–99)
Glucose-Capillary: 111 mg/dL — ABNORMAL HIGH (ref 70–99)
Glucose-Capillary: 217 mg/dL — ABNORMAL HIGH (ref 70–99)

## 2022-12-11 LAB — HEPATITIS B SURFACE ANTIBODY, QUANTITATIVE: Hep B S AB Quant (Post): 37.1 m[IU]/mL (ref >9.9)

## 2022-12-11 MED ORDER — PREDNISONE 10 MG PO TABS: ORAL_TABLET | ORAL | 0 refills | Status: DC

## 2022-12-11 MED ORDER — METHYLPREDNISOLONE SODIUM SUCC 40 MG IJ SOLR
40.0000 mg | Freq: Every day | INTRAMUSCULAR | Status: DC
  Filled 2022-12-11: qty 1

## 2022-12-11 MED ORDER — INSULIN NPH ISOPHANE & REGULAR (70-30) 100 UNIT/ML ~~LOC~~ SUSP: 5.0000 [IU] | Freq: Two times a day (BID) | SUBCUTANEOUS | 11 refills | Status: DC

## 2022-12-11 MED ORDER — PREDNISONE 20 MG PO TABS
40.0000 mg | ORAL_TABLET | Freq: Once | ORAL | Status: AC
  Administered 2022-12-11: 40 mg via ORAL
  Filled 2022-12-11: qty 2

## 2022-12-11 MED ORDER — ALPRAZOLAM 0.5 MG PO TABS: 0.5000 mg | ORAL_TABLET | Freq: Once | ORAL | Status: DC | PRN

## 2022-12-11 NOTE — Assessment & Plan Note (Signed)
-  Continue Lipitor °

## 2022-12-11 NOTE — Evaluation (Addendum)
Physical Therapy Evaluation Patient Details Name: Anita Contreras MRN: [id number] DOB: [birth year] Today's Date: 12/11/2022  History of Present Illness  67 y/o female presented to ED on 12/09/22 for pain in R thigh/groin and unable to ambulate. CT of pelvis showed possible osteolytic lesions. PMH: CKD on HD, T2DM, HTN, CVA, neuropathy  Clinical Impression  Patient admitted with the above. PTA, patient lives with roommate per granddaughter but typically independent with no AD. Patient reports 2 falls just prior to admission due to R LE numbness. Patient presents with R LE weakness, pain, impaired sensation to R LE, impaired balance, and decreased activity tolerance. Ambulated 180' with RW and min guard but complaining of increasing pain with distance. LOB x 2 during ambulation but patient able to self correct. Encouraged use of Rw at home for safety to reduce fall risk. Patient will benefit from skilled PT services during acute stay to address listed deficits. Recommend HHPT at discharge to maximize functional independence and safety.     Utilized video interpreter Anita Contreras [phone]     Recommendations for follow up therapy are one component of a multi-disciplinary discharge planning process, led by the attending physician.  Recommendations may be updated based on patient status, additional functional criteria and insurance authorization.  Follow Up Recommendations Home health PT      Assistance Recommended at Discharge Intermittent Supervision/Assistance  Patient can return home with the following  A little help with walking and/or transfers;A little help with bathing/dressing/bathroom;Assistance with cooking/housework;Help with stairs or ramp for entrance;Assist for transportation    Equipment Recommendations Rolling Kjirsten Bloodgood (2 wheels)  Recommendations for Other Services       Functional Status Assessment Patient has had a recent decline in their functional status and demonstrates the  ability to make significant improvements in function in a reasonable and predictable amount of time.     Precautions / Restrictions Precautions Precautions: Fall Restrictions Weight Bearing Restrictions: No      Mobility  Bed Mobility               General bed mobility comments: in chair on arrival    Transfers Overall transfer level: Needs assistance Equipment used: Rolling Porschia [place]banks (2 wheels) Transfers: Sit to/from Stand Sit to Stand: Supervision           General transfer comment: cues for hand placement    Ambulation/Gait Ambulation/Gait assistance: Min guard Gait Distance (Feet): 180 Feet Assistive device: Rolling Rogelio Waynick (2 wheels) Gait Pattern/deviations: Step-through pattern, Decreased stride length Gait velocity: decreased     General Gait Details: increasing pain with distance. Min guard for Scientist, research (medical)    Modified Rankin (Stroke Patients Only)       Balance Overall balance assessment: Needs assistance, History of Falls Sitting-balance support: Feet supported, No upper extremity supported Sitting balance-Leahy Scale: Good     Standing balance support: Bilateral upper extremity supported, Reliant on assistive device for balance Standing balance-Leahy Scale: Fair Standing balance comment: reliant on RW for support                             Pertinent Vitals/Pain Pain Assessment Pain Assessment: Faces Faces Pain Scale: Hurts whole lot Pain Location: R LE after mobility Pain Descriptors / Indicators: Discomfort, Grimacing, Guarding Pain Intervention(s): Monitored during session, Repositioned    Home Living Family/patient expects to be discharged to:: Private residence Living Arrangements: Alone Available  Help at Discharge: Family Type of Home: Mobile home Home Access: Stairs to enter Entrance Stairs-Rails: Psychiatric nurse of Steps: 2   Home Layout: One level Home  Equipment: None      Prior Function Prior Level of Function : Independent/Modified Independent;History of Falls (last six months)             Mobility Comments: reports 2 falls just prior to admission. Amb with no AD       Hand Dominance        Extremity/Trunk Assessment   Upper Extremity Assessment Upper Extremity Assessment: Overall WFL for tasks assessed    Lower Extremity Assessment Lower Extremity Assessment: Generalized weakness;RLE deficits/detail RLE Deficits / Details: reports numbness from groin to toes    Cervical / Trunk Assessment Cervical / Trunk Assessment: Normal  Communication   Communication: Prefers language other than [place];Interpreter utilized  Cognition Arousal/Alertness: Awake/alert Behavior During Therapy: WFL for tasks assessed/performed Overall Cognitive Status: Within Functional Limits for tasks assessed                                 General Comments: seems WFL based on conversation with interpreter        General Comments      Exercises     Assessment/Plan    PT Assessment Patient needs continued PT services  PT Problem List Decreased strength;Decreased activity tolerance;Decreased balance;Decreased mobility       PT Treatment Interventions DME instruction;Functional mobility training;Therapeutic activities;Therapeutic exercise;Balance training;Stair training;Gait training;Patient/family education    PT Goals (Current goals can be found in the Care Plan section)  Acute Rehab PT Goals Patient Stated Goal: did not state PT Goal Formulation: With patient Time For Goal Achievement: 12/25/22 Potential to Achieve Goals: Good    Frequency Min 2X/week     Co-evaluation               AM-PAC PT "6 Clicks" Mobility  Outcome Measure Help needed turning from your back to your side while in a flat bed without using bedrails?: A Little Help needed moving from lying on your back to sitting on the side of a  flat bed without using bedrails?: A Little Help needed moving to and from a bed to a chair (including a wheelchair)?: A Little Help needed standing up from a chair using your arms (e.g., wheelchair or bedside chair)?: A Little Help needed to walk in hospital room?: A Little Help needed climbing 3-5 steps with a railing? : A Little 6 Click Score: 18    End of Session Equipment Utilized During Treatment: Gait belt Activity Tolerance: Patient tolerated treatment well Patient left: in chair;with call bell/phone within reach Nurse Communication: Mobility status PT Visit Diagnosis: Muscle weakness (generalized) (M62.81);Unsteadiness on feet (R26.81);Other abnormalities of gait and mobility (R26.89)    Time: 5176-1607 PT Time Calculation (min) (ACUTE ONLY): 24 min   Charges:   PT Evaluation $PT Eval Low Complexity: 1 Low PT Treatments $Therapeutic Activity: 8-22 mins        Noe Goyer A. Gilford Rile PT, DPT [facility]   Jawann Urbani A Mithra Spano 12/11/2022, 11:48 AM

## 2022-12-11 NOTE — Progress Notes (Signed)
[facility]  ROUNDING NOTE   Subjective:   Anita Contreras  is a 67 y.o. spanish speaking female with past medical history of hypertension, dyslipidemia, type 2 diabetes with diabetic retinopathy, CVA, peripheral neuropathy, and end-stage renal disease on hemodialysis.  Patient is known to [place] from previous admissions. She presents for leg pain. Last dialysis treatment was on Saturday.   Patient currently receives outpatient dialysis treatments at [facility] on a TTS schedule, supervised by [facility] physicians.    Patient seen resting in bed Continues to complains of leg weakness Denies pain  Objective:  Vital signs in last 24 hours:  Temp:  [97.6 F (36.4 C)-98.3 F (36.8 C)] 97.6 F (36.4 C) (01/03 0751) Pulse Rate:  [60-72] 65 (01/03 0751) Resp:  [14-21] 18 (01/03 0751) BP: (132-188)/(49-84) 162/55 (01/03 0751) SpO2:  [94 %-99 %] 99 % (01/03 0751) Weight:  [46 kg] 46 kg (01/02 1421)  Weight change: -2.8 kg Filed Weights   12/09/22 1455 12/10/22 1030 12/10/22 1421  Weight: 50 kg 47.2 kg 46 kg    Intake/Output: I/O last 3 completed shifts: In: 120 [P.O.:120] Out: 1000 [Other:1000]   Intake/Output this shift:  No intake/output data recorded.  Physical Exam: General: NAD, resting comfortably  Head: Normocephalic, atraumatic. Moist oral mucosal membranes  Eyes: Anicteric  Lungs:  Clear to auscultation, normal effort, room air  Heart: Regular rate and rhythm  Abdomen:  Soft, nontender, nondistended  Extremities: No peripheral edema.  Neurologic: Alert and oriented, moving all four extremities  Skin: No lesions  Access: Left upper aVF    Basic Metabolic Panel: Recent Labs  Lab 12/09/22 1842 12/10/22 0603 12/11/22 0611  NA 136 139 135  K 5.6* 6.1* 4.4  CL 97* 102 94*  CO2 23 23 29   GLUCOSE 128* 81 92  BUN 96* 97* 35*  CREATININE 9.00* 9.70* 4.82*  CALCIUM 8.2* 7.8* 7.5*  MG 2.9*  --   --   PHOS 4.8*  --  4.0     Liver Function  Tests: Recent Labs  Lab 12/09/22 1842 12/11/22 0611  AST 21  --   ALT 12  --   ALKPHOS 166*  --   BILITOT 0.7  --   PROT 7.0  --   ALBUMIN 4.0 3.2*    No results for input(s): "LIPASE", "AMYLASE" in the last 168 hours. No results for input(s): "AMMONIA" in the last 168 hours.  CBC: Recent Labs  Lab 12/09/22 1842 12/10/22 0603  WBC 7.1 6.1  HGB 12.5 11.2*  HCT 40.3 35.6*  MCV 102.3* 102.6*  PLT 197 178     Cardiac Enzymes: No results for input(s): "CKTOTAL", "CKMB", "CKMBINDEX", "TROPONINI" in the last 168 hours.  BNP: Invalid input(s): "POCBNP"  CBG: Recent Labs  Lab 12/10/22 0822 12/10/22 1516 12/10/22 1600 12/10/22 2130 12/11/22 0806  GLUCAP 85 83 141* 177* 83     Microbiology: Results for orders placed or performed during the hospital encounter of 08/27/21  Resp Panel by RT-PCR (Flu A&B, Covid) Nasopharyngeal Swab     Status: None   Collection Time: 08/27/21  9:11 PM   Specimen: Nasopharyngeal Swab; Nasopharyngeal(NP) swabs in vial transport medium  Result Value Ref Range Status   SARS Coronavirus 2 by RT PCR NEGATIVE NEGATIVE Final    Comment: (NOTE) SARS-CoV-2 target nucleic acids are NOT DETECTED.  The SARS-CoV-2 RNA is generally detectable in upper respiratory specimens during the acute phase of infection. The lowest concentration of SARS-CoV-2 viral copies this assay can  detect is 138 copies/mL. A negative result does not preclude SARS-Cov-2 infection and should not be used as the sole basis for treatment or other patient management decisions. A negative result may occur with  improper specimen collection/handling, submission of specimen other than nasopharyngeal swab, presence of viral mutation(s) within the areas targeted by this assay, and inadequate number of viral copies(<138 copies/mL). A negative result must be combined with clinical observations, patient history, and epidemiological information. The expected result is  Negative.  Fact Sheet for Patients:  EntrepreneurPulse.com.au  Fact Sheet for Healthcare Providers:  IncredibleEmployment.be  This test is no t yet approved or cleared by the [place] FDA and  has been authorized for detection and/or diagnosis of SARS-CoV-2 by FDA under an Emergency Use Authorization (EUA). This EUA will remain  in effect (meaning this test can be used) for the duration of the COVID-19 declaration under Section 564(b)(1) of the Act, 21 U.S.C.section 360bbb-3(b)(1), unless the authorization is terminated  or revoked sooner.       Influenza A by PCR NEGATIVE NEGATIVE Final   Influenza B by PCR NEGATIVE NEGATIVE Final    Comment: (NOTE) The Xpert Xpress SARS-CoV-2/FLU/RSV plus assay is intended as an aid in the diagnosis of influenza from Nasopharyngeal swab specimens and should not be used as a sole basis for treatment. Nasal washings and aspirates are unacceptable for Xpert Xpress SARS-CoV-2/FLU/RSV testing.  Fact Sheet for Patients: EntrepreneurPulse.com.au  Fact Sheet for Healthcare Providers: IncredibleEmployment.be  This test is not yet approved or cleared by the [place] FDA and has been authorized for detection and/or diagnosis of SARS-CoV-2 by FDA under an Emergency Use Authorization (EUA). This EUA will remain in effect (meaning this test can be used) for the duration of the COVID-19 declaration under Section 564(b)(1) of the Act, 21 U.S.C. section 360bbb-3(b)(1), unless the authorization is terminated or revoked.  Performed at [facility], [address] [place], [place] [address]   MRSA Next Gen by PCR, Nasal     Status: None   Collection Time: 08/28/21  1:08 AM   Specimen: Nasal Mucosa; Nasal Swab  Result Value Ref Range Status   MRSA by PCR Next Gen NOT DETECTED NOT DETECTED Final    Comment: (NOTE) The GeneXpert MRSA Assay (FDA approved for NASAL  specimens only), is one component of a comprehensive MRSA colonization surveillance program. It is not intended to diagnose MRSA infection nor to guide or monitor treatment for MRSA infections. Test performance is not FDA approved in patients less than 66 years old. Performed at [facility], [address] [place], [place] [address]   CULTURE, BLOOD (ROUTINE X 2) w Reflex to ID Panel     Status: None   Collection Time: 08/29/21 10:27 AM   Specimen: BLOOD  Result Value Ref Range Status   Specimen Description BLOOD  [facility]  Final   Special Requests   Final    BOTTLES DRAWN AEROBIC AND ANAEROBIC Blood Culture adequate volume   Culture   Final    NO GROWTH 5 DAYS Performed at [facility], [address] [place],  [address]    Report Status 09/03/2021 FINAL  Final  CULTURE, BLOOD (ROUTINE X 2) w Reflex to ID Panel     Status: None   Collection Time: 08/29/21 10:27 AM   Specimen: BLOOD  Result Value Ref Range Status   Specimen Description BLOOD  [facility]  Final   Special Requests   Final    BOTTLES DRAWN  AEROBIC AND ANAEROBIC Blood Culture adequate volume   Culture   Final    NO GROWTH 5 DAYS Performed at [facility], [address] [place], [place] [address]    Report Status 09/03/2021 FINAL  Final    Coagulation Studies: No results for input(s): "LABPROT", "INR" in the last 72 hours.  Urinalysis: No results for input(s): "COLORURINE", "LABSPEC", "PHURINE", "GLUCOSEU", "HGBUR", "BILIRUBINUR", "KETONESUR", "PROTEINUR", "UROBILINOGEN", "NITRITE", "LEUKOCYTESUR" in the last 72 hours.  Invalid input(s): "APPERANCEUR"    Imaging: CT PELVIS WO CONTRAST  Result Date: 12/09/2022 CLINICAL DATA:  Unable to walk EXAM: CT PELVIS WITHOUT CONTRAST TECHNIQUE: Multidetector CT imaging of the pelvis was performed following the standard protocol without intravenous contrast. RADIATION DOSE REDUCTION: This exam was performed according to the departmental  dose-optimization program which includes automated exposure control, adjustment of the mA and/or kV according to patient size and/or use of iterative reconstruction technique. COMPARISON:  02/24/2018 FINDINGS: Urinary Tract: Mild urinary bladder wall thickening consistent with hypertrophy or inflammation. Bowel:  Unremarkable visualized pelvic bowel loops. Vascular/Lymphatic: Dense atheromatous calcifications. No adenopathy identified. Reproductive:  Pelvic organs are unremarkable. Musculoskeletal: Possible osteolytic changes with numerous small punctate areas of lucency in the pelvis. Consider correlation with bone survey. No traumatic osseous abnormalities are identified. IMPRESSION: 1. No acute traumatic abnormalities. 2. No intraperitoneal abnormalities. 3. Possible osteolytic lesions. Correlation with bone survey may be helpful. Electronically Signed   By: Sammie Bench M.D.   On: 12/09/2022 20:15   [place] Upper Ext Art Right  Result Date: 12/09/2022 CLINICAL DATA: Right arm numbness beyond arteriovenous fistula EXAM: RIGHT UPPER EXTREMITY ARTERIAL DUPLEX SCAN TECHNIQUE: Gray-scale sonography as well as color Doppler and duplex ultrasound was performed to evaluate the arteries of the upper extremity. COMPARISON:  None Available. FINDINGS: Right subclavian and axillary artery are well visualized with monophasic waveforms although widely patent. Brachial artery demonstrates patency with monophasic waveform. Brachial artery beyond the fistula demonstrates to and fro flow consistent with a patent fistula. Radial and ulnar arteries are patent. Arteriovenous fistula is patent at the anastomosis with normal flow. Stents are seen within the outflow vein similar to that noted on prior plain film. IMPRESSION: Patent arteriovenous fistula. The arterial structures beyond the fistula demonstrates some mild to and fro flow although no true steal is identified Electronically Signed   By: Inez Catalina M.D.   On: 12/09/2022  19:43   DG Hip Unilat With Pelvis 2-3 Views Right  Result Date: 12/09/2022 CLINICAL DATA:  Right hip pain. Right groin to thigh pain for the past week. Unable to walk this past week. EXAM: DG HIP (WITH OR WITHOUT PELVIS) 2-3V RIGHT COMPARISON:  CT abdomen and pelvis 04/26/2018 FINDINGS: Moderate bilateral superomedial femoroacetabular joint space narrowing. On lateral view there is mild acute angulation of the anterior right femoral head-neck junction that raises suspicion for an acute nondisplaced fracture. However, no fracture is visualized within the proximal right femur on the provided frontal views of the right hip or pelvis. Mild bilateral sacroiliac subchondral sclerosis. The pubic symphysis joint space is maintained. IMPRESSION: 1. On lateral view there is mild acute angulation of the anterior right femoral head-neck junction that raises suspicion for an acute nondisplaced fracture. However, this is not seen on other views. Recommend clinical correlation. If clinically indicated, CT of the right hip may help further evaluate. 2. Moderate bilateral femoroacetabular osteoarthritis. Electronically Signed   By: Yvonne Kendall M.D.   On: 12/09/2022 18:26   US Venous Img Lower Unilateral Right  Result Date: 12/09/2022 CLINICAL DATA:  Pain EXAM: RIGHT LOWER EXTREMITY VENOUS DOPPLER ULTRASOUND TECHNIQUE: Gray-scale sonography with compression, as well as color and duplex ultrasound, were performed to evaluate the deep venous system(s) from the level of the common femoral vein through the popliteal and proximal calf veins. COMPARISON:  05/22/2016 FINDINGS: VENOUS Normal compressibility of the common femoral, superficial femoral, and popliteal veins, as well as the visualized calf veins. Visualized portions of profunda femoral vein and great saphenous vein unremarkable. No filling defects to suggest DVT on grayscale or color Doppler imaging. Doppler waveforms show normal direction of venous flow, normal  respiratory plasticity and response to augmentation. Limited views of the contralateral common femoral vein are unremarkable. OTHER None. Limitations: none IMPRESSION: Negative. Electronically Signed   By: Sammie Bench M.D.   On: 12/09/2022 17:32   CT Head Wo Contrast  Result Date: 12/09/2022 CLINICAL DATA:  Neuro deficit, acute, stroke suspected EXAM: CT HEAD WITHOUT CONTRAST TECHNIQUE: Contiguous axial images were obtained from the base of the skull through the vertex without intravenous contrast. RADIATION DOSE REDUCTION: This exam was performed according to the departmental dose-optimization program which includes automated exposure control, adjustment of the mA and/or kV according to patient size and/or use of iterative reconstruction technique. COMPARISON:  01/26/2018. FINDINGS: Brain: There is periventricular white matter decreased attenuation consistent with small vessel ischemic changes. Ventricles, sulci and cisterns are prominent consistent with age related involutional changes. No acute intracranial hemorrhage, mass effect or shift. No hydrocephalus. Vascular: No hyperdense vessel or unexpected calcification. Skull: Normal. Negative for fracture or focal lesion. Sinuses/Orbits: No acute finding. IMPRESSION: Atrophy and chronic small vessel ischemic changes. No acute intracranial process identified. Electronically Signed   By: Sammie Bench M.D.   On: 12/09/2022 17:31     Medications:    anticoagulant sodium citrate      amLODipine  10 mg Oral Daily   aspirin EC  81 mg Oral Daily   atorvastatin  40 mg Oral q1800   calcium acetate  667 mg Oral TID WC   Chlorhexidine Gluconate Cloth  6 each Topical Q0600   enoxaparin (LOVENOX) injection  40 mg Subcutaneous Q24H   hydrALAZINE  100 mg Oral Q8H   insulin aspart  0-5 Units Subcutaneous QHS   insulin aspart  0-6 Units Subcutaneous TID WC   insulin aspart  2 Units Subcutaneous TID WC   pantoprazole  40 mg Oral Daily   acetaminophen  **OR** acetaminophen, ALPRAZolam, alteplase, anticoagulant sodium citrate, heparin, hydrOXYzine, lidocaine (PF), lidocaine-prilocaine, magnesium hydroxide, morphine injection, ondansetron **OR** ondansetron (ZOFRAN) IV, pentafluoroprop-tetrafluoroeth, traZODone  Assessment/ Plan:  Anita Contreras is a 67 y.o.  female with past medical history of hypertension, dyslipidemia, type 2 diabetes with diabetic retinopathy, CVA, peripheral neuropathy, and end-stage renal disease on hemodialysis.  Patient is known to [place] from previous admissions. She presents for leg pain. Last dialysis treatment was on Saturday.   [facility] [place]/TTS/Lt AVF  Hyperkalemia with end-stage renal disease on hemodialysis.  Last treatment completed also on Saturday.  Potassium on admission 5.6. Corrected to 4.4 with dialysis. Next treatment scheduled for Thursday.   2. Anemia of chronic kidney disease Lab Results  Component Value Date   HGB 11.2 (L) 12/10/2022  Hemoglobin acceptable. Patient receives Mircera during outpatient treatments.  3. Secondary Hyperparathyroidism: with outpatient labs: PTH 559, phosphorus 2.9, calcium 8.3 on 11/28/2022.   Lab Results  Component Value Date   PTH 183 (H) 11/09/2016   CALCIUM 7.5 (L) 12/11/2022  PHOS 4.0 12/11/2022    Currently prescribed calcitriol and calcium acetate with meals outpatient.  Calcium remains decreased, calcium acetate continued with meals.   4. Diabetes mellitus type II with chronic kidney disease/renal manifestations: insulin dependent. Home regimen includes NovoLog 70/30. Most recent hemoglobin A1c is 6.0 on 08/07/21. Glucose well controlled at this time.   .  LOS: 0 Kriss Ishler 1/3/202412:20 PM

## 2022-12-11 NOTE — Discharge Summary (Signed)
Physician Discharge Summary   Patient: Anita Contreras MRN: [id number] DOB: [birth year]  Admit date:     12/09/2022  Discharge date: 12/11/22  Discharge Physician: Loletha Grayer   PCP: Frazier Richards, MD   Recommendations at discharge:   Follow-up PCP 5 days Stay on dialysis schedule Follow-up neurosurgery in few weeks  Discharge Diagnoses: Principal Problem:   Lumbar radiculopathy, right Active Problems:   Hyperkalemia   ESRD on hemodialysis ([place])   Essential hypertension   Type 2 diabetes mellitus with diabetic retinopathy ([place])   Dyslipidemia   GERD without esophagitis    Hospital Course: Taken from H&P.  Anita Contreras is a 67 y.o. Hispanic female with medical history significant for end-stage renal disease on hemodialysis on TTS, type 2 diabetes mellitus with diabetic retinopathy, hypertension, dyslipidemia, peripheral neuropathy and CVA, who presented to the emergency room with acute onset of right leg weakness with pain with inability to ambulate for the past couple of days.  No history of falls.  Also had numbness in the right forearm past her AV fistula. ED Course: When she came to the ER, BP was 176/53 with otherwise normal vital signs.  Labs revealed potassium of 5.6 and chloride 97 with BUN of 96 and creatinine of 9, calcium 8.2 and anion gap of 16 with phosphorus 4.8 and magnesium 2.9 with alk phos 166 and otherwise unremarkable LFTs.  CBC showed hemoglobin 12.5 and hematocrit 40.3 much better than previous levels.  EKG showed normal sinus rhythm with a rate of 69 with peaked T waves in V3 through V5. Imaging: Pelvic and right hip x-ray mild acute angulation of the anterior right femoral head neck junction decreases suspicion for acute nondisplaced fracture however it was not seen on other views and therefore CT of the right hip was recommended.  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  Pelvic CT without contrast revealed  possible osteolytic lesions with no evidence for fracture and no intraperitoneal abnormalities. Right upper extremity ultrasound revealed patent AV fistula with no steal syndrome. Right lower extremity venous Doppler was negative for DVT.  1/2: Hemodynamically stable.  Labs pertinent for potassium of 6.1.  Ordered 1 dose of calcium gluconate and nephrology was notified for dialysis, she was taken for dialysis. Pending PT/OT evaluation  1/3.  Patient still complaining of some numbness down the right leg.  She was able to walk with physical therapy recommending home with home health.  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  This may affect her balance.  MRI of the lumbar spine showed a large broad-based disc protrusion at L4-L5 with mass effect on both sides of the thecal sac involving the L5 nerve roots.  Case discussed with Dr. Cari Caraway neurosurgery and recommended steroids and follow-up as outpatient.    Assessment and Plan: * Lumbar radiculopathy, right Patient with numbness down the right leg.  MRI of the lumbar spine showing protrusion of disc compressing on thecal sac and exiting nerve roots.  Likely the cause of her symptoms.  Will give a trial of steroids and follow-up with neurosurgery as outpatient.  Hyperkalemia Treated with dialysis here in the hospital.  Continue dialysis schedule.  ESRD on hemodialysis [facility]) Continue Tuesday Thursday Saturday dialysis.  Essential hypertension - Continue home amlodipine and hydralazine.  Type 2 diabetes mellitus with diabetic retinopathy ([place]) Patient was on 70/30 insulin 5 units twice  a day.  Sugars will be high while on steroids.  Since her last hemoglobin A1c is 5.1 can consider discontinuing insulin as outpatient and watching sugars once off steroids.  Dyslipidemia Continue Lipitor  GERD without  esophagitis Continue PPI         Consultants: nephrology Procedures performed: dialysis Disposition: Home Diet recommendation:  Cardiac and Carb modified diet DISCHARGE MEDICATION: Allergies as of 12/11/2022       Reactions   Dextromethorphan-guaifenesin    Other reaction(s): Itching of skin (finding)   Propofol Itching   Pt says " when getting anesthesia by body itches" unsure what type of anesthesia        Medication List     TAKE these medications    acetaminophen 500 MG tablet Commonly known as: TYLENOL Take 1,000 mg by mouth every 6 (six) hours as needed for mild pain or headache.   amLODipine 10 MG tablet Commonly known as: NORVASC Take 1 tablet (10 mg total) by mouth daily.   aspirin EC 81 MG tablet TAKE 1 TABLET BY MOUTH EVERY DAY   atorvastatin 40 MG tablet Commonly known as: LIPITOR Take 40 mg by mouth daily at 6 PM.   calcium acetate 667 MG capsule Commonly known as: PHOSLO Take 1 capsule (667 mg total) by mouth 3 (three) times daily with meals.   hydrALAZINE 100 MG tablet Commonly known as: APRESOLINE Take 1 tablet (100 mg total) by mouth every 8 (eight) hours.   hydrOXYzine 50 MG tablet Commonly known as: ATARAX Take 25 mg by mouth 2 (two) times daily as needed for itching.   insulin NPH-regular Human (70-30) 100 UNIT/ML injection Inject 5 Units into the skin 2 (two) times daily with a meal. What changed: how much to take   lidocaine-prilocaine cream Commonly known as: EMLA Apply 1 application  topically as needed.   pantoprazole 40 MG tablet Commonly known as: PROTONIX Take 1 tablet (40 mg total) by mouth daily.   predniSONE 10 MG tablet Commonly known as: DELTASONE 4 tabs po day1; 3 tabs po day2,3; 2 tabs po day4,5; 1 tab po day6,7,8; 1/2 tab po day9,10,11,12               Durable Medical Equipment  (From admission, onward)           Start     Ordered   12/11/22 1151  For home use only DME Walker rolling  Once        Question Answer Comment  Walker: With [place] Wheels   Patient needs a walker to treat with the following condition Weakness      12/11/22 1151            Follow-up Information     Frazier Richards, MD Follow up in 5 day(s).   Specialty: Family Medicine Contact information: [place] [address] [phone]         Meade Maw, MD Follow up in 2 week(s).   Specialty: Neurosurgery Contact information: [place] [address] [phone]                Discharge Exam: Danley Danker Weights   12/09/22 1455 12/10/22 1030 12/10/22 1421  Weight: 50 kg 47.2 kg 46 kg   Physical Exam HENT:     Head: Normocephalic.     Mouth/Throat:     Pharynx: No oropharyngeal exudate.  Eyes:     General: Lids are normal.     Conjunctiva/sclera: Conjunctivae  normal.  Cardiovascular:     Rate and Rhythm: Normal rate and regular rhythm.     Heart sounds: Normal heart sounds, S1 normal and S2 normal.  Pulmonary:     Breath sounds: No decreased breath sounds, wheezing, rhonchi or rales.  Abdominal:     Palpations: Abdomen is soft.     Tenderness: There is no abdominal tenderness.  Musculoskeletal:     Right lower leg: No swelling.     Left lower leg: No swelling.  Skin:    General: Skin is warm.     Findings: No rash.  Neurological:     Mental Status: She is alert and oriented to person, place, and time.     Comments: Patient able to lift her legs up off the bed and extend at the knee and ankle on the right foot.  She states the feeling is a little bit decreased on the right side to light touch.  I observe the patient ambulating with physical therapy in the hallway.  Slow gait but walks with a walker.      Condition at discharge: stable  The results of significant diagnostics from this hospitalization (including imaging, microbiology, ancillary and laboratory) are listed below for reference.   Imaging Studies: MR BRAIN WO  CONTRAST  Result Date: 12/11/2022 CLINICAL DATA:  Acute neuro deficit rule out stroke. ESRD on dialysis. EXAM: MRI HEAD WITHOUT CONTRAST TECHNIQUE: Multiplanar, multiecho pulse sequences of the brain and surrounding structures were obtained without intravenous contrast. COMPARISON:  CT head 12/09/2022 FINDINGS: Brain: Negative for acute infarct. Negative for mass lesion. Ventricle size normal. Chronic microhemorrhage right cerebellum unchanged from MRI of 04/17/2016. Surrounding edema around the hemorrhage has resolved since the prior MRI. No new hemorrhage or mass lesion identified. Vascular: Normal arterial flow voids. Skull and upper cervical spine: No focal skeletal abnormality Sinuses/Orbits: Mild mucosal edema paranasal sinuses. Negative orbit Other: None IMPRESSION: 1. Negative for acute infarct. 2. Chronic microhemorrhage right cerebellum Electronically Signed   By: Franchot Gallo M.D.   On: 12/11/2022 14:26   MR LUMBAR SPINE WO CONTRAST  Result Date: 12/11/2022 CLINICAL DATA:  Back pain and radiculopathy. EXAM: MRI LUMBAR SPINE WITHOUT CONTRAST TECHNIQUE: Multiplanar, multisequence MR imaging of the lumbar spine was performed. No intravenous contrast was administered. COMPARISON:  None Available. FINDINGS: Examination is limited by patient motion. Segmentation: Based on a prior CT scan of the abdomen/pelvis there is transitional lumbar anatomy with partial lumbarization of S1 and a small disc space at S1-2. Alignment: Normal Vertebrae: Normal marrow signal. No bone lesions or fractures. Mild endplate reactive changes at L3-4. Conus medullaris and cauda equina: Conus extends to the L1 level. Conus and cauda equina appear normal. Paraspinal and other soft tissues: No significant paraspinal or retroperitoneal findings. Atrophic kidneys with small benign cysts not requiring further evaluation or follow-up. Disc levels: T12-L1: No significant findings. L1-2: No significant findings. L2-3: No significant  findings. L3-4: No significant findings. L4-5: Large broad-based disc protrusion with mass effect on both sides of the thecal sac, likely affecting both L5 nerve roots. There is also a right foraminal component likely affecting the right L4 nerve root. L5-S1: Bulging annulus and facet disease but no significant spinal or foraminal stenosis. IMPRESSION: Large broad-based disc protrusion at L4-5 with mass effect on both sides of the thecal sac likely involving both L5 nerve roots. There is also a right foraminal component likely affecting the right L4 nerve root. Electronically Signed   By: Ricky Stabs.D.  On: 12/11/2022 14:11   CT PELVIS WO CONTRAST  Result Date: 12/09/2022 CLINICAL DATA:  Unable to walk EXAM: CT PELVIS WITHOUT CONTRAST TECHNIQUE: Multidetector CT imaging of the pelvis was performed following the standard protocol without intravenous contrast. RADIATION DOSE REDUCTION: This exam was performed according to the departmental dose-optimization program which includes automated exposure control, adjustment of the mA and/or kV according to patient size and/or use of iterative reconstruction technique. COMPARISON:  02/24/2018 FINDINGS: Urinary Tract: Mild urinary bladder wall thickening consistent with hypertrophy or inflammation. Bowel:  Unremarkable visualized pelvic bowel loops. Vascular/Lymphatic: Dense atheromatous calcifications. No adenopathy identified. Reproductive:  Pelvic organs are unremarkable. Musculoskeletal: Possible osteolytic changes with numerous small punctate areas of lucency in the pelvis. Consider correlation with bone survey. No traumatic osseous abnormalities are identified. IMPRESSION: 1. No acute traumatic abnormalities. 2. No intraperitoneal abnormalities. 3. Possible osteolytic lesions. Correlation with bone survey may be helpful. Electronically Signed   By: Sammie Bench M.D.   On: 12/09/2022 20:15   [place] Upper Ext Art Right  Result Date: 12/09/2022 CLINICAL DATA:  Right arm numbness beyond arteriovenous fistula EXAM: RIGHT UPPER EXTREMITY ARTERIAL DUPLEX SCAN TECHNIQUE: Gray-scale sonography as well as color Doppler and duplex ultrasound was performed to evaluate the arteries of the upper extremity. COMPARISON:  None Available. FINDINGS: Right subclavian and axillary artery are well visualized with monophasic waveforms although widely patent. Brachial artery demonstrates patency with monophasic waveform. Brachial artery beyond the fistula demonstrates to and fro flow consistent with a patent fistula. Radial and ulnar arteries are patent. Arteriovenous fistula is patent at the anastomosis with normal flow. Stents are seen within the outflow vein similar to that noted on prior plain film. IMPRESSION: Patent arteriovenous fistula. The arterial structures beyond the fistula demonstrates some mild to and fro flow although no true steal is identified Electronically Signed   By: Inez Catalina M.D.   On: 12/09/2022 19:43   DG Hip Unilat With Pelvis 2-3 Views Right  Result Date: 12/09/2022 CLINICAL DATA:  Right hip pain. Right groin to thigh pain for the past week. Unable to walk this past week. EXAM: DG HIP (WITH OR WITHOUT PELVIS) 2-3V RIGHT COMPARISON:  CT abdomen and pelvis 04/26/2018 FINDINGS: Moderate bilateral superomedial femoroacetabular joint space narrowing. On lateral view there is mild acute angulation of the anterior right femoral head-neck junction that raises suspicion for an acute nondisplaced fracture. However, no fracture is visualized within the proximal right femur on the provided frontal views of the right hip or pelvis. Mild bilateral sacroiliac subchondral sclerosis. The pubic symphysis joint space is maintained. IMPRESSION: 1. On lateral view there is mild acute angulation of the anterior right femoral head-neck junction that raises suspicion for an acute nondisplaced fracture. However, this is not seen on other views. Recommend clinical correlation. If  clinically indicated, CT of the right hip may help further evaluate. 2. Moderate bilateral femoroacetabular osteoarthritis. Electronically Signed   By: Yvonne Kendall M.D.   On: 12/09/2022 18:26   US Venous Img Lower Unilateral Right  Result Date: 12/09/2022 CLINICAL DATA:  Pain EXAM: RIGHT LOWER EXTREMITY VENOUS DOPPLER ULTRASOUND TECHNIQUE: Gray-scale sonography with compression, as well as color and duplex ultrasound, were performed to evaluate the deep venous system(s) from the level of the common femoral vein through the popliteal and proximal calf veins. COMPARISON:  05/22/2016 FINDINGS: VENOUS Normal compressibility of the common femoral, superficial femoral, and popliteal veins, as well as the visualized calf veins. Visualized portions of profunda femoral vein and great saphenous  vein unremarkable. No filling defects to suggest DVT on grayscale or color Doppler imaging. Doppler waveforms show normal direction of venous flow, normal respiratory plasticity and response to augmentation. Limited views of the contralateral common femoral vein are unremarkable. OTHER None. Limitations: none IMPRESSION: Negative. Electronically Signed   By: Sammie Bench M.D.   On: 12/09/2022 17:32   CT Head Wo Contrast  Result Date: 12/09/2022 CLINICAL DATA:  Neuro deficit, acute, stroke suspected EXAM: CT HEAD WITHOUT CONTRAST TECHNIQUE: Contiguous axial images were obtained from the base of the skull through the vertex without intravenous contrast. RADIATION DOSE REDUCTION: This exam was performed according to the departmental dose-optimization program which includes automated exposure control, adjustment of the mA and/or kV according to patient size and/or use of iterative reconstruction technique. COMPARISON:  01/26/2018. FINDINGS: Brain: There is periventricular white matter decreased attenuation consistent with small vessel ischemic changes. Ventricles, sulci and cisterns are prominent consistent with age related  involutional changes. No acute intracranial hemorrhage, mass effect or shift. No hydrocephalus. Vascular: No hyperdense vessel or unexpected calcification. Skull: Normal. Negative for fracture or focal lesion. Sinuses/Orbits: No acute finding. IMPRESSION: Atrophy and chronic small vessel ischemic changes. No acute intracranial process identified. Electronically Signed   By: Sammie Bench M.D.   On: 12/09/2022 17:31    Microbiology: Results for orders placed or performed during the hospital encounter of 08/27/21  Resp Panel by RT-PCR (Flu A&B, Covid) Nasopharyngeal Swab     Status: None   Collection Time: 08/27/21  9:11 PM   Specimen: Nasopharyngeal Swab; Nasopharyngeal(NP) swabs in vial transport medium  Result Value Ref Range Status   SARS Coronavirus 2 by RT PCR NEGATIVE NEGATIVE Final    Comment: (NOTE) SARS-CoV-2 target nucleic acids are NOT DETECTED.  The SARS-CoV-2 RNA is generally detectable in upper respiratory specimens during the acute phase of infection. The lowest concentration of SARS-CoV-2 viral copies this assay can detect is 138 copies/mL. A negative result does not preclude SARS-Cov-2 infection and should not be used as the sole basis for treatment or other patient management decisions. A negative result may occur with  improper specimen collection/handling, submission of specimen other than nasopharyngeal swab, presence of viral mutation(s) within the areas targeted by this assay, and inadequate number of viral copies(<138 copies/mL). A negative result must be combined with clinical observations, patient history, and epidemiological information. The expected result is Negative.  Fact Sheet for Patients:  EntrepreneurPulse.com.au  Fact Sheet for Healthcare Providers:  IncredibleEmployment.be  This test is no t yet approved or cleared by the [place] FDA and  has been authorized for detection and/or diagnosis of SARS-CoV-2  by FDA under an Emergency Use Authorization (EUA). This EUA will remain  in effect (meaning this test can be used) for the duration of the COVID-19 declaration under Section 564(b)(1) of the Act, 21 U.S.C.section 360bbb-3(b)(1), unless the authorization is terminated  or revoked sooner.       Influenza A by PCR NEGATIVE NEGATIVE Final   Influenza B by PCR NEGATIVE NEGATIVE Final    Comment: (NOTE) The Xpert Xpress SARS-CoV-2/FLU/RSV plus assay is intended as an aid in the diagnosis of influenza from Nasopharyngeal swab specimens and should not be used as a sole basis for treatment. Nasal washings and aspirates are unacceptable for Xpert Xpress SARS-CoV-2/FLU/RSV testing.  Fact Sheet for Patients: EntrepreneurPulse.com.au  Fact Sheet for Healthcare Providers: IncredibleEmployment.be  This test is not yet approved or cleared by the [place] FDA and has been authorized for detection  and/or diagnosis of SARS-CoV-2 by FDA under an Emergency Use Authorization (EUA). This EUA will remain in effect (meaning this test can be used) for the duration of the COVID-19 declaration under Section 564(b)(1) of the Act, 21 U.S.C. section 360bbb-3(b)(1), unless the authorization is terminated or revoked.  Performed at [facility], [address] [place], [place] [address]   MRSA Next Gen by PCR, Nasal     Status: None   Collection Time: 08/28/21  1:08 AM   Specimen: Nasal Mucosa; Nasal Swab  Result Value Ref Range Status   MRSA by PCR Next Gen NOT DETECTED NOT DETECTED Final    Comment: (NOTE) The GeneXpert MRSA Assay (FDA approved for NASAL specimens only), is one component of a comprehensive MRSA colonization surveillance program. It is not intended to diagnose MRSA infection nor to guide or monitor treatment for MRSA infections. Test performance is not FDA approved in patients less than 23 years old. Performed at [facility],  [address] [place], [place] [address]   CULTURE, BLOOD (ROUTINE X 2) w Reflex to ID Panel     Status: None   Collection Time: 08/29/21 10:27 AM   Specimen: BLOOD  Result Value Ref Range Status   Specimen Description BLOOD  [facility]  Final   Special Requests   Final    BOTTLES DRAWN AEROBIC AND ANAEROBIC Blood Culture adequate volume   Culture   Final    NO GROWTH 5 DAYS Performed at [facility], [address] [place], [place] [address]    Report Status 09/03/2021 FINAL  Final  CULTURE, BLOOD (ROUTINE X 2) w Reflex to ID Panel     Status: None   Collection Time: 08/29/21 10:27 AM   Specimen: BLOOD  Result Value Ref Range Status   Specimen Description BLOOD  [facility]  Final   Special Requests   Final    BOTTLES DRAWN AEROBIC AND ANAEROBIC Blood Culture adequate volume   Culture   Final    NO GROWTH 5 DAYS Performed at [facility], [address] [place],  [address]    Report Status 09/03/2021 FINAL  Final    Labs: CBC: Recent Labs  Lab 12/09/22 1842 12/10/22 0603  WBC 7.1 6.1  HGB 12.5 11.2*  HCT 40.3 35.6*  MCV 102.3* 102.6*  PLT 197 295   Basic Metabolic Panel: Recent Labs  Lab 12/09/22 1842 12/10/22 0603 12/11/22 0611  NA 136 139 135  K 5.6* 6.1* 4.4  CL 97* 102 94*  CO2 _0 GLUCOSE 128* 81 92  BUN 96* 97* 35*  CREATININE 9.00* 9.70* 4.82*  CALCIUM 8.2* 7.8* 7.5*  MG 2.9*  --   --   PHOS 4.8*  --  4.0   Liver Function Tests: Recent Labs  Lab 12/09/22 1842 12/11/22 0611  AST 21  --   ALT 12  --   ALKPHOS 166*  --   BILITOT 0.7  --   PROT 7.0  --   ALBUMIN 4.0 3.2*   CBG: Recent Labs  Lab 12/10/22 1600 12/10/22 2130 12/11/22 0806 12/11/22 1219 12/11/22 1558  GLUCAP 141* 177* 83 111* 217*    Discharge time spent: greater than 30 minutes.  Signed: Loletha Grayer, MD [facility] 12/11/2022

## 2022-12-11 NOTE — Assessment & Plan Note (Signed)
Continue Tuesday Thursday Saturday dialysis.

## 2022-12-11 NOTE — Assessment & Plan Note (Signed)
Continue PPI ?

## 2022-12-11 NOTE — Assessment & Plan Note (Signed)
Patient with numbness down the right leg.  MRI of the lumbar spine showing protrusion of disc compressing on thecal sac and exiting nerve roots.  Likely the cause of her symptoms.  Will give a trial of steroids and follow-up with neurosurgery as outpatient.

## 2022-12-11 NOTE — Assessment & Plan Note (Signed)
Patient was on 70/30 insulin 5 units twice a day.  Sugars will be high while on steroids.  Since her last hemoglobin A1c is 5.1 can consider discontinuing insulin as outpatient and watching sugars once off steroids.

## 2022-12-11 NOTE — Assessment & Plan Note (Signed)
-   Continue home amlodipine and hydralazine.

## 2022-12-11 NOTE — Assessment & Plan Note (Signed)
Treated with dialysis here in the hospital.  Continue dialysis schedule.

## 2022-12-13 LAB — PROTEIN ELECTROPHORESIS, SERUM
A/G Ratio: 1.8 — ABNORMAL HIGH (ref 0.7–1.7)
Albumin ELP: 3.7 g/dL (ref 2.9–4.4)
Alpha-1-Globulin: 0.2 g/dL (ref 0.0–0.4)
Alpha-2-Globulin: 0.5 g/dL (ref 0.4–1.0)
Beta Globulin: 0.6 g/dL — ABNORMAL LOW (ref 0.7–1.3)
Gamma Globulin: 0.8 g/dL (ref 0.4–1.8)
Globulin, Total: 2.1 g/dL — ABNORMAL LOW (ref 2.2–3.9)
Total Protein ELP: 5.8 g/dL — ABNORMAL LOW (ref 6.0–8.5)

## 2022-12-20 NOTE — Progress Notes (Deleted)
Referring Physician:  Frazier Richards, [address],  [place] [address]  Primary Physician:  Frazier Richards, MD  Interpreter used as patient speaks spanish.***  History of Present Illness: 12/20/2022*** Ms. Chariyah Wisz has a history of ESRD on dialysis, HTN, DM, dyslipidemia, CVA, peripheral neuropathy, and GERD.   Was recently admitted  on 12/09/22 for right leg weakness/falls. She has known large broad based disc at L4-L5, this was discussed with Dr. Izora Ribas and he recommended steroids with outpatient follow up.   She was discharged on 12 day course of prednisone.      Duration: *** Location: *** Quality: *** Severity: ***  Precipitating: aggravated by *** Modifying factors: made better by *** Weakness: none Timing: *** Bowel/Bladder Dysfunction: none  Conservative measures:  Physical therapy: ***  Multimodal medical therapy including regular antiinflammatories: prednisone  Injections: *** epidural steroid injections  Past Surgery: ***  Zamariah Morales-Mendoza has ***no symptoms of cervical myelopathy.  The symptoms are causing a significant impact on the patient's life.   Review of Systems:  A 10 point review of systems is negative, except for the pertinent positives and negatives detailed in the HPI.  Past Medical History: Past Medical History:  Diagnosis Date   Acute cystitis with hematuria    Anemia    Cataracts, bilateral    CKD (chronic kidney disease)    Diabetic retinopathy, background ([place])    Dialysis patient ([place])    Tues, Thurs, Sat   Dizziness    Hyperlipidemia    Hypertension    Migraine variant    Neuropathy    OP (osteoporosis)    Personal history of noncompliance with medical treatment, presenting hazards to health    Steal syndrome as complication of dialysis access [facility])    Stroke ([place])    Type II diabetes mellitus with ophthalmic manifestations, uncontrolled    Vitamin D deficiency     Past Surgical  History: Past Surgical History:  Procedure Laterality Date   A/V FISTULAGRAM Right 06/03/2018   Procedure: A/V FISTULAGRAM;  Surgeon: Algernon Huxley, MD;  Location: [facility];  Service: Cardiovascular;  Laterality: Right;   A/V FISTULAGRAM Right 02/22/2019   Procedure: A/V FISTULAGRAM;  Surgeon: Algernon Huxley, MD;  Location: [place] [facility];  Service: Cardiovascular;  Laterality: Right;   A/V FISTULAGRAM Right 08/09/2021   Procedure: A/V FISTULAGRAM;  Surgeon: Algernon Huxley, MD;  Location: [facility];  Service: Cardiovascular;  Laterality: Right;   A/V SHUNT INTERVENTION N/A 01/26/2018   Procedure: A/V SHUNT INTERVENTION;  Surgeon: Algernon Huxley, MD;  Location: [facility];  Service: Cardiovascular;  Laterality: N/A;   A/V SHUNTOGRAM Right 01/26/2018   Procedure: A/V SHUNTOGRAM;  Surgeon: Algernon Huxley, MD;  Location: [place] [facility];  Service: Cardiovascular;  Laterality: Right;   AV FISTULA PLACEMENT Right 10/10/2016   Procedure: ARTERIOVENOUS (AV) FISTULA CREATION ( BRACHIOCEPHALIC );  Surgeon: Algernon Huxley, MD;  Location: [facility];  Service: Vascular;  Laterality: Right;   DILATION AND CURETTAGE OF UTERUS     DISTAL REVASCULARIZATION AND INTERVAL LIGATION (DRIL) Right 11/08/2016   Procedure: DISTAL REVASCULARIZATION AND INTERVAL LIGATION (DRIL);  Surgeon: Algernon Huxley, MD;  Location: [facility];  Service: Vascular;  Laterality: Right;   PERIPHERAL VASCULAR CATHETERIZATION N/A 04/25/2016   Procedure: Dialysis/Perma Catheter Insertion;  Surgeon: Algernon Huxley, MD;  Location: [facility];  Service: Cardiovascular;  Laterality: N/A;   PERIPHERAL VASCULAR  CATHETERIZATION Right 10/17/2016   Procedure: Upper Extremity Angiography;  Surgeon: Algernon Huxley, MD;  Location: [facility];  Service: Cardiovascular;  Laterality: Right;   UPPER EXTREMITY ANGIOGRAPHY Right 10/12/2018   Procedure: UPPER EXTREMITY ANGIOGRAPHY;  Surgeon: Algernon Huxley, MD;  Location: [facility];  Service: Cardiovascular;  Laterality: Right;    Allergies: Allergies as of 12/31/2022 - Review Complete 12/09/2022  Allergen Reaction Noted   Dextromethorphan-guaifenesin  05/12/2020   Propofol Itching 02/13/2017    Medications: Outpatient Encounter Medications as of 12/31/2022  Medication Sig   acetaminophen (TYLENOL) 500 MG tablet Take 1,000 mg by mouth every 6 (six) hours as needed for mild pain or headache.   amLODipine (NORVASC) 10 MG tablet Take 1 tablet (10 mg total) by mouth daily.   aspirin 81 MG EC tablet TAKE 1 TABLET BY MOUTH EVERY DAY   atorvastatin (LIPITOR) 40 MG tablet Take 40 mg by mouth daily at 6 PM.   calcium acetate (PHOSLO) 667 MG capsule Take 1 capsule (667 mg total) by mouth 3 (three) times daily with meals.   hydrALAZINE (APRESOLINE) 100 MG tablet Take 1 tablet (100 mg total) by mouth every 8 (eight) hours.   hydrOXYzine (ATARAX/VISTARIL) 50 MG tablet Take 25 mg by mouth 2 (two) times daily as needed for itching.   insulin NPH-regular Human (70-30) 100 UNIT/ML injection Inject 5 Units into the skin 2 (two) times daily with a meal.   lidocaine-prilocaine (EMLA) cream Apply 1 application  topically as needed.   pantoprazole (PROTONIX) 40 MG tablet Take 1 tablet (40 mg total) by mouth daily.   predniSONE (DELTASONE) 10 MG tablet 4 tabs po day1; 3 tabs po day2,3; 2 tabs po day4,5; 1 tab po day6,7,8; 1/2 tab po day9,10,11,12   No facility-administered encounter medications on file as of 12/31/2022.    Social History: Social History   Tobacco Use   Smoking status: Never   Smokeless tobacco: Never  Substance Use Topics   Alcohol use: No   Drug use: No    Family Medical History: Family History  Problem Relation Age of Onset   Hypertension Mother    Hyperlipidemia Mother     Physical Examination: There were no vitals filed for this visit.  General: Patient is well developed, well nourished, calm, collected, and in no apparent distress.  Attention to examination is appropriate.  Respiratory: Patient is breathing without any difficulty.   NEUROLOGICAL:     Awake, alert, oriented to person, place, and time.  Speech is clear and fluent. Fund of knowledge is appropriate.   Cranial Nerves: Pupils equal round and reactive to light.  Facial tone is symmetric.    ROM of spine:  *** ROM of cervical spine *** pain *** ROM of lumbar spine *** pain  No abnormal lesions on exposed skin.   Strength: Side Biceps Triceps Deltoid Interossei Grip Wrist Ext. Wrist Flex.  R 5 5 5 5 5 5 5  $ L 5 5 5 5 5 5 5   $ Side Iliopsoas Quads Hamstring PF DF EHL  R 5 5 5 5 5 5  $ L 5 5 5 5 5 5   $ Reflexes are ***2+ and symmetric at the biceps, triceps, brachioradialis, patella and achilles.   Hoffman's is absent.  Clonus is not present.   Bilateral upper and lower extremity sensation is intact to light touch.    No evidence of dysmetria noted.  Gait is normal.   ***No difficulty with tandem gait.  Medical Decision Making  Imaging: MRI of lumbar spine dated 12/11/22:  FINDINGS: Examination is limited by patient motion. Segmentation: Based on a prior CT scan of the abdomen/pelvis there is transitional lumbar anatomy with partial lumbarization of S1 and a small disc space at S1-2.   Alignment: Normal   Vertebrae: Normal marrow signal. No bone lesions or fractures. Mild endplate reactive changes at L3-4.   Conus medullaris and cauda equina: Conus extends to the L1 level. Conus and cauda equina appear normal.   Paraspinal and other soft tissues: No significant paraspinal or retroperitoneal findings. Atrophic kidneys with small benign cysts not requiring further evaluation or follow-up.   Disc levels:   T12-L1: No significant findings.   L1-2: No significant findings.   L2-3: No significant findings.   L3-4: No significant findings.   L4-5: Large broad-based disc protrusion with mass effect on both sides of the thecal sac,  likely affecting both L5 nerve roots. There is also a right foraminal component likely affecting the right L4 nerve root.   L5-S1: Bulging annulus and facet disease but no significant spinal or foraminal stenosis.   IMPRESSION: Large broad-based disc protrusion at L4-5 with mass effect on both sides of the thecal sac likely involving both L5 nerve roots. There is also a right foraminal component likely affecting the right L4 nerve root.     Electronically Signed   By: Marijo Sanes M.D.   On: 12/11/2022 14:11  I have personally reviewed the images and agree with the above interpretation.  Assessment and Plan: Ms. Belk is a pleasant 67 y.o. female with ***  Treatment options discussed with patient and following plan made:   - Order for physical therapy for *** spine ***. - Continue on current medications including ***. Reviewed proper dosing along with risks and benefits. Take and NSAIDs with food.      I spent a total of *** minutes in face-to-face and non-face-to-face activities related to this patient's care today including review of outside records, review of imaging, review of symptoms, physical exam, discussion of differential diagnosis, discussion of treatment options, and documentation.   Thank you for involving me in the care of this patient.   Geronimo Boot PA-C Dept. of Neurosurgery

## 2022-12-31 ENCOUNTER — Ambulatory Visit: Payer: Self-pay | Admitting: Orthopedic Surgery

## 2023-08-11 IMAGING — CT CT ANGIO CHEST
2 of 6 series · 18 of 46 positions shown · IV contrast (APPLIED)
Comparison: CT 02/10/2021, chest x-ray 08/27/2021

CLINICAL DATA: PE suspected, low/intermediate prob, positive
D-dimer, shortness of breath

EXAM:
CT ANGIOGRAPHY CHEST WITH CONTRAST
TECHNIQUE: Multidetector CT imaging of the chest was performed using the
standard protocol during bolus administration of intravenous
contrast. Multiplanar CT image reconstructions and MIPs were
obtained to evaluate the vascular anatomy.
CONTRAST:  75mL OMNIPAQUE IOHEXOL 350 MG/ML SOLN

[Series 5: thins · axial · 0.61mm/px · z∈[-308,-71]mm · 15 of 324 slices shown]
[im 14/324  lung]
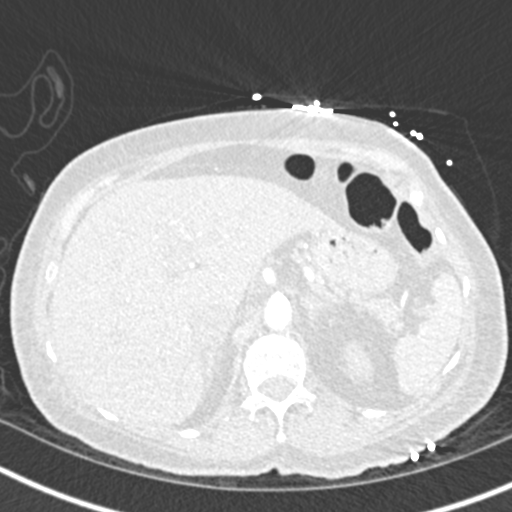
[im 41/324  soft-tissue]
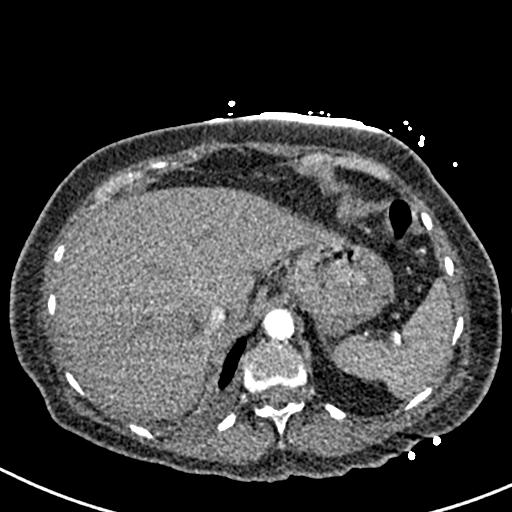
[im 54/324  lung]
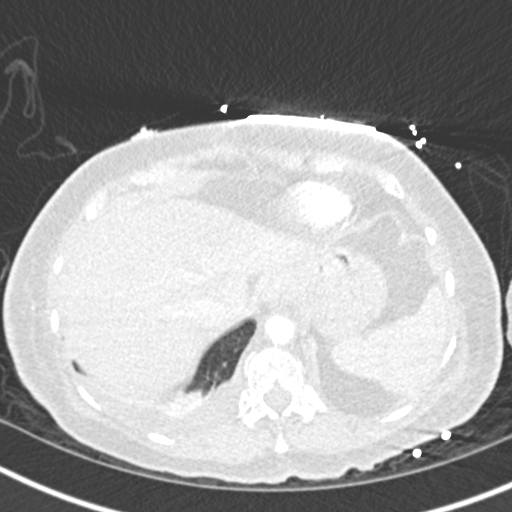
[im 81/324  soft-tissue]
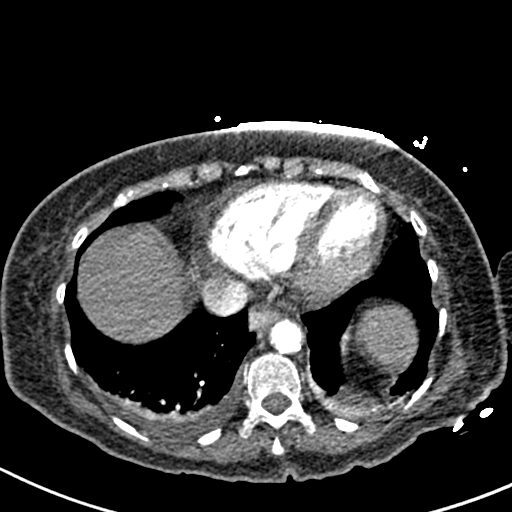
[im 95/324  lung]
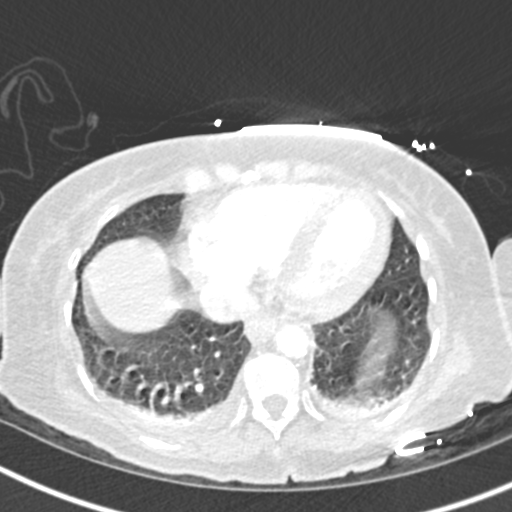
[im 122/324  soft-tissue]
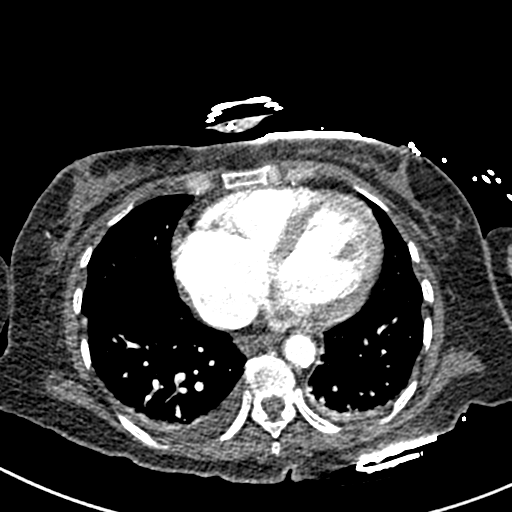
[im 135/324  lung]
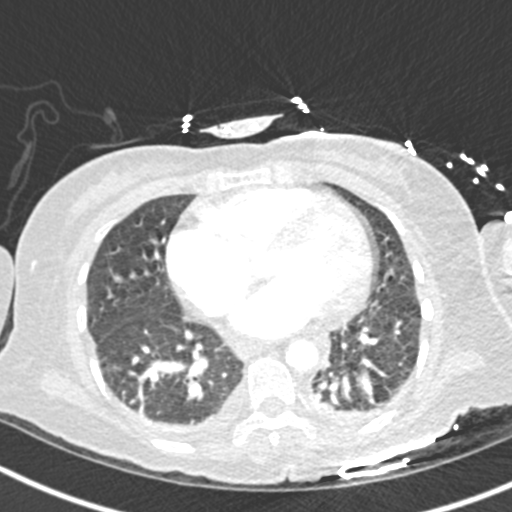
[im 162/324  soft-tissue]
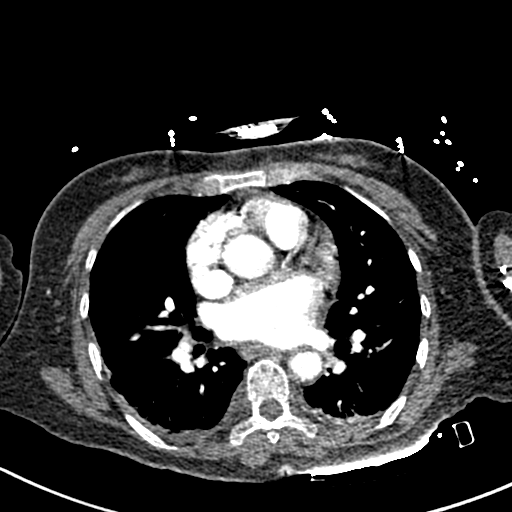
[im 189/324  lung]
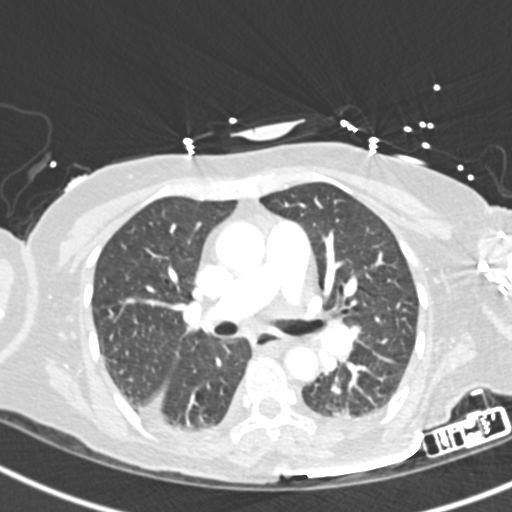
[im 202/324  soft-tissue]
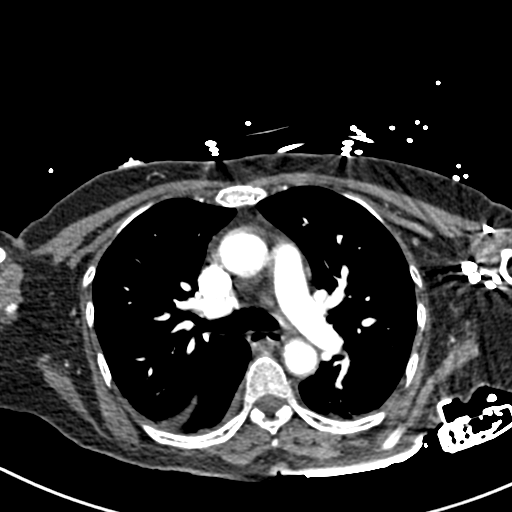
[im 229/324  lung]
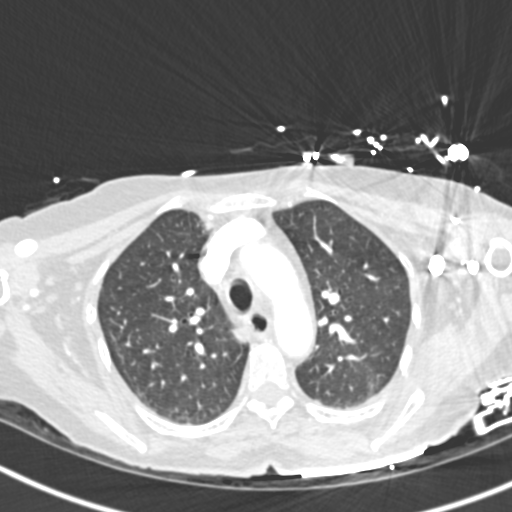
[im 243/324  soft-tissue]
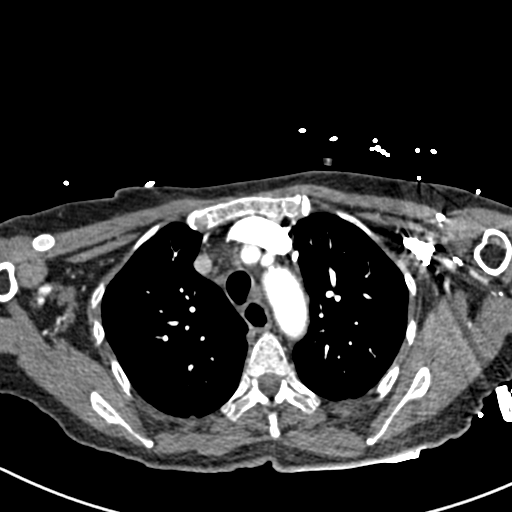
[im 270/324  lung]
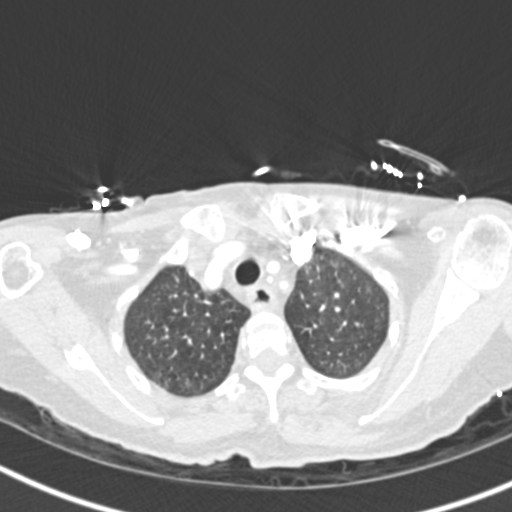
[im 283/324  soft-tissue]
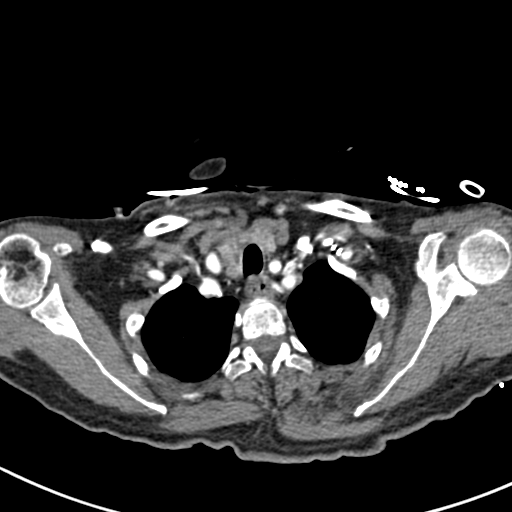
[im 310/324  lung]
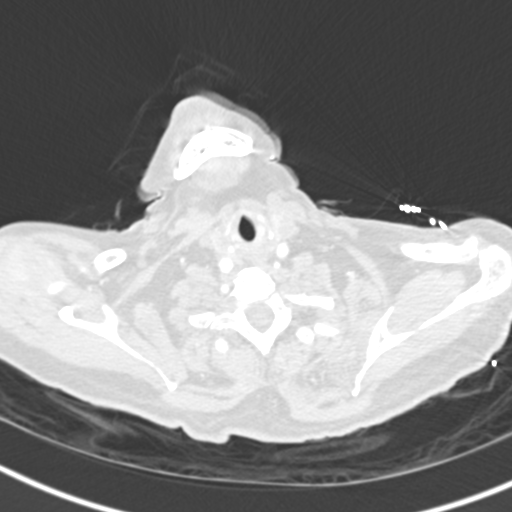

[Series 7: coronal mpr · coronal · 0.51mm/px · 3 of 74 slices shown]
[im 19/74  soft-tissue]
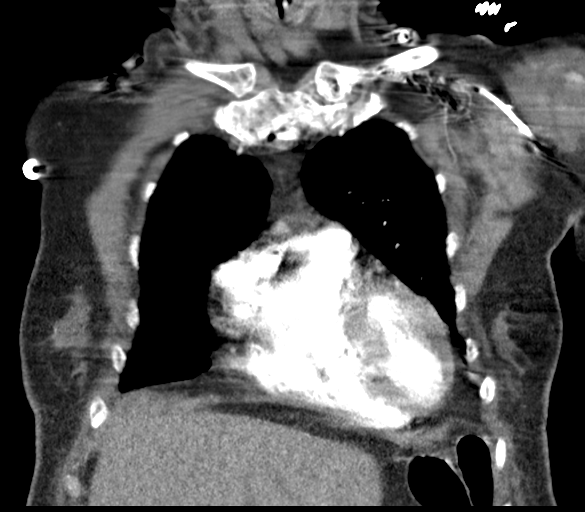
[im 37/74  soft-tissue]
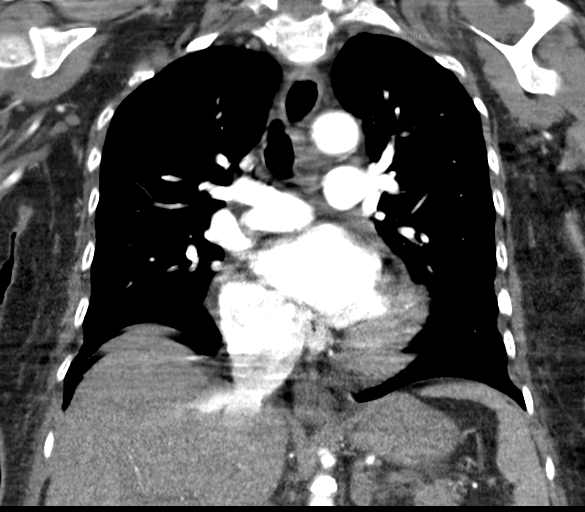
[im 55/74  soft-tissue]
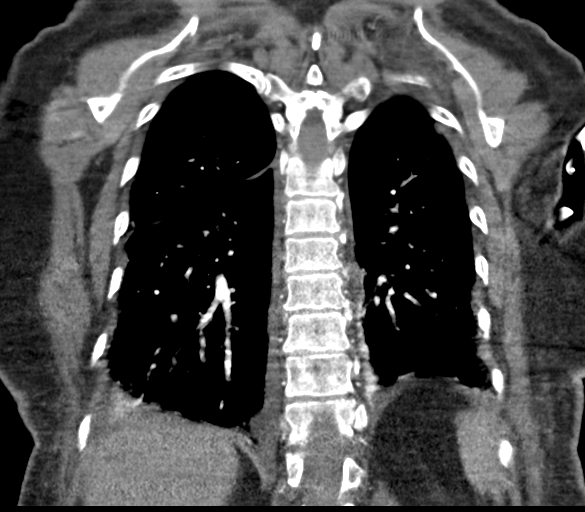

[18 of 46 positions shown; findings below may reference images not displayed]

FINDINGS: Cardiovascular: Satisfactory opacification of the pulmonary arteries
to the segmental level. No evidence of pulmonary embolism. Slight
respiratory motion degradation particularly at the lung bases.
Thoracic aorta is nonaneurysmal. Scattered atherosclerotic
calcifications of the aorta and coronary arteries. Heart size is
mildly enlarged. No pericardial effusion.

Mediastinum/Nodes: Overall decrease in size of multiple prominent
mediastinal lymph nodes including 8 mm lower left paratracheal node
and 6 mm AP window node. No axillary or hilar lymphadenopathy.
Trachea and esophagus within normal limits. Subcentimeter right
thyroid lobe nodule. Not clinically significant; no follow-up
imaging recommended (ref: [HOSPITAL]. [DATE]): 143-50).

Lungs/Pleura: Trace bilateral pleural effusions, right greater than
left with associated compressive atelectasis. Lungs are otherwise
clear. No pneumothorax.

Upper Abdomen: Reflux of contrast into the IVC and hepatic veins. No
acute findings are seen within the included upper abdomen.

Musculoskeletal: No chest wall abnormality. No acute or significant
osseous findings.

Review of the MIP images confirms the above findings.
IMPRESSION: 1. Negative for pulmonary embolism.
2. Trace bilateral pleural effusions, right greater than left, with
associated compressive atelectasis.
3. Overall decrease in size of multiple prominent mediastinal lymph
nodes, which were likely reactive.
4. Reflux of contrast into the IVC and hepatic veins, suggesting
right heart dysfunction.

Aortic Atherosclerosis ([id number]-CZJ.J).

## 2023-08-27 ENCOUNTER — Inpatient Hospital Stay
Admission: EM | Admit: 2023-08-27 | Discharge: 2023-08-29 | DRG: 304 | Disposition: A | Discharge status: Home / Self Care | Payer: Self-pay | Attending: Internal Medicine | Admitting: Internal Medicine

## 2023-08-27 ENCOUNTER — Other Ambulatory Visit: Payer: Self-pay

## 2023-08-27 ENCOUNTER — Emergency Department: Payer: Self-pay

## 2023-08-27 ENCOUNTER — Observation Stay: Payer: Self-pay

## 2023-08-27 DIAGNOSIS — R4182 Altered mental status, unspecified: Secondary | ICD-10-CM

## 2023-08-27 DIAGNOSIS — H3321 Serous retinal detachment, right eye: Secondary | ICD-10-CM | POA: Diagnosis present

## 2023-08-27 DIAGNOSIS — Z83438 Family history of other disorder of lipoprotein metabolism and other lipidemia: Secondary | ICD-10-CM

## 2023-08-27 DIAGNOSIS — Z992 Dependence on renal dialysis: Secondary | ICD-10-CM

## 2023-08-27 DIAGNOSIS — N186 End stage renal disease: Secondary | ICD-10-CM | POA: Diagnosis present

## 2023-08-27 DIAGNOSIS — E11649 Type 2 diabetes mellitus with hypoglycemia without coma: Secondary | ICD-10-CM | POA: Diagnosis present

## 2023-08-27 DIAGNOSIS — H5462 Unqualified visual loss, left eye, normal vision right eye: Secondary | ICD-10-CM | POA: Diagnosis present

## 2023-08-27 DIAGNOSIS — Z681 Body mass index (BMI) 19 or less, adult: Secondary | ICD-10-CM

## 2023-08-27 DIAGNOSIS — E1140 Type 2 diabetes mellitus with diabetic neuropathy, unspecified: Secondary | ICD-10-CM | POA: Diagnosis present

## 2023-08-27 DIAGNOSIS — D631 Anemia in chronic kidney disease: Secondary | ICD-10-CM | POA: Diagnosis present

## 2023-08-27 DIAGNOSIS — M810 Age-related osteoporosis without current pathological fracture: Secondary | ICD-10-CM | POA: Diagnosis present

## 2023-08-27 DIAGNOSIS — I10 Essential (primary) hypertension: Secondary | ICD-10-CM | POA: Diagnosis present

## 2023-08-27 DIAGNOSIS — E1122 Type 2 diabetes mellitus with diabetic chronic kidney disease: Secondary | ICD-10-CM | POA: Diagnosis present

## 2023-08-27 DIAGNOSIS — I639 Cerebral infarction, unspecified: Secondary | ICD-10-CM | POA: Diagnosis present

## 2023-08-27 DIAGNOSIS — R200 Anesthesia of skin: Secondary | ICD-10-CM

## 2023-08-27 DIAGNOSIS — R202 Paresthesia of skin: Secondary | ICD-10-CM

## 2023-08-27 DIAGNOSIS — E11319 Type 2 diabetes mellitus with unspecified diabetic retinopathy without macular edema: Secondary | ICD-10-CM | POA: Diagnosis present

## 2023-08-27 DIAGNOSIS — I160 Hypertensive urgency: Secondary | ICD-10-CM | POA: Diagnosis present

## 2023-08-27 DIAGNOSIS — R29810 Facial weakness: Secondary | ICD-10-CM | POA: Diagnosis present

## 2023-08-27 DIAGNOSIS — Z885 Allergy status to narcotic agent status: Secondary | ICD-10-CM

## 2023-08-27 DIAGNOSIS — Z8673 Personal history of transient ischemic attack (TIA), and cerebral infarction without residual deficits: Secondary | ICD-10-CM

## 2023-08-27 DIAGNOSIS — R29898 Other symptoms and signs involving the musculoskeletal system: Secondary | ICD-10-CM

## 2023-08-27 DIAGNOSIS — I132 Hypertensive heart and chronic kidney disease with heart failure and with stage 5 chronic kidney disease, or end stage renal disease: Secondary | ICD-10-CM | POA: Diagnosis present

## 2023-08-27 DIAGNOSIS — Z888 Allergy status to other drugs, medicaments and biological substances status: Secondary | ICD-10-CM

## 2023-08-27 DIAGNOSIS — E785 Hyperlipidemia, unspecified: Secondary | ICD-10-CM | POA: Diagnosis present

## 2023-08-27 DIAGNOSIS — I5032 Chronic diastolic (congestive) heart failure: Secondary | ICD-10-CM | POA: Diagnosis present

## 2023-08-27 DIAGNOSIS — I161 Hypertensive emergency: Principal | ICD-10-CM | POA: Diagnosis present

## 2023-08-27 DIAGNOSIS — R9089 Other abnormal findings on diagnostic imaging of central nervous system: Secondary | ICD-10-CM | POA: Diagnosis present

## 2023-08-27 DIAGNOSIS — E1139 Type 2 diabetes mellitus with other diabetic ophthalmic complication: Secondary | ICD-10-CM | POA: Diagnosis present

## 2023-08-27 DIAGNOSIS — Z8249 Family history of ischemic heart disease and other diseases of the circulatory system: Secondary | ICD-10-CM

## 2023-08-27 DIAGNOSIS — I959 Hypotension, unspecified: Secondary | ICD-10-CM | POA: Diagnosis not present

## 2023-08-27 DIAGNOSIS — Z79899 Other long term (current) drug therapy: Secondary | ICD-10-CM

## 2023-08-27 DIAGNOSIS — E441 Mild protein-calorie malnutrition: Secondary | ICD-10-CM | POA: Diagnosis present

## 2023-08-27 DIAGNOSIS — I169 Hypertensive crisis, unspecified: Principal | ICD-10-CM

## 2023-08-27 DIAGNOSIS — Z7982 Long term (current) use of aspirin: Secondary | ICD-10-CM

## 2023-08-27 DIAGNOSIS — Z1152 Encounter for screening for COVID-19: Secondary | ICD-10-CM

## 2023-08-27 DIAGNOSIS — E875 Hyperkalemia: Secondary | ICD-10-CM | POA: Diagnosis present

## 2023-08-27 DIAGNOSIS — R112 Nausea with vomiting, unspecified: Secondary | ICD-10-CM | POA: Diagnosis present

## 2023-08-27 DIAGNOSIS — N2581 Secondary hyperparathyroidism of renal origin: Secondary | ICD-10-CM | POA: Diagnosis present

## 2023-08-27 LAB — DIFFERENTIAL
Abs Immature Granulocytes: 0.08 10*3/uL — ABNORMAL HIGH (ref 0.00–0.07)
Basophils Absolute: 0 10*3/uL (ref 0.0–0.1)
Basophils Relative: 1 %
Eosinophils Absolute: 0.1 10*3/uL (ref 0.0–0.5)
Eosinophils Relative: 2 %
Immature Granulocytes: 1 %
Lymphocytes Relative: 18 %
Lymphs Abs: 1.1 10*3/uL (ref 0.7–4.0)
Monocytes Absolute: 0.4 10*3/uL (ref 0.1–1.0)
Monocytes Relative: 7 %
Neutro Abs: 4.2 10*3/uL (ref 1.7–7.7)
Neutrophils Relative %: 71 %

## 2023-08-27 LAB — COMPREHENSIVE METABOLIC PANEL WITH GFR
ALT: 14 U/L (ref 0–44)
AST: 30 U/L (ref 15–41)
Albumin: 4.8 g/dL (ref 3.5–5.0)
Alkaline Phosphatase: 183 U/L — ABNORMAL HIGH (ref 38–126)
Anion gap: 13 (ref 5–15)
BUN: 66 mg/dL — ABNORMAL HIGH (ref 8–23)
CO2: 28 mmol/L (ref 22–32)
Calcium: 9.4 mg/dL (ref 8.9–10.3)
Chloride: 98 mmol/L (ref 98–111)
Creatinine, Ser: 6.44 mg/dL — ABNORMAL HIGH (ref 0.44–1.00)
GFR, Estimated: 7 mL/min — ABNORMAL LOW (ref >60)
Glucose, Bld: 128 mg/dL — ABNORMAL HIGH (ref 70–99)
Potassium: 6.3 mmol/L (ref 3.5–5.1)
Sodium: 139 mmol/L (ref 135–145)
Total Bilirubin: 0.9 mg/dL (ref 0.3–1.2)
Total Protein: 7.9 g/dL (ref 6.5–8.1)

## 2023-08-27 LAB — CBC
HCT: 39.2 % (ref 36.0–46.0)
HCT: 34.1 % — ABNORMAL LOW (ref 36.0–46.0)
Hemoglobin: 13.1 g/dL (ref 12.0–15.0)
Hemoglobin: 11 g/dL — ABNORMAL LOW (ref 12.0–15.0)
MCH: 32.3 pg (ref 26.0–34.0)
MCH: 32.2 pg (ref 26.0–34.0)
MCHC: 33.4 g/dL (ref 30.0–36.0)
MCHC: 32.3 g/dL (ref 30.0–36.0)
MCV: 96.8 fL (ref 80.0–100.0)
MCV: 99.7 fL (ref 80.0–100.0)
Platelets: 147 10*3/uL — ABNORMAL LOW (ref 150–400)
Platelets: 126 10*3/uL — ABNORMAL LOW (ref 150–400)
RBC: 4.05 MIL/uL (ref 3.87–5.11)
RBC: 3.42 MIL/uL — ABNORMAL LOW (ref 3.87–5.11)
RDW: 14 % (ref 11.5–15.5)
RDW: 14.1 % (ref 11.5–15.5)
WBC: 5.9 10*3/uL (ref 4.0–10.5)
WBC: 4.9 10*3/uL (ref 4.0–10.5)
nRBC: 0 % (ref 0.0–0.2)
nRBC: 0 % (ref 0.0–0.2)

## 2023-08-27 LAB — APTT: aPTT: 31 s (ref 24–36)

## 2023-08-27 LAB — CBG MONITORING, ED
Glucose-Capillary: 147 mg/dL — ABNORMAL HIGH (ref 70–99)
Glucose-Capillary: 150 mg/dL — ABNORMAL HIGH (ref 70–99)
Glucose-Capillary: 171 mg/dL — ABNORMAL HIGH (ref 70–99)
Glucose-Capillary: 44 mg/dL — CL (ref 70–99)

## 2023-08-27 LAB — RENAL FUNCTION PANEL
Albumin: 3.5 g/dL (ref 3.5–5.0)
Anion gap: 10 (ref 5–15)
BUN: 68 mg/dL — ABNORMAL HIGH (ref 8–23)
CO2: 27 mmol/L (ref 22–32)
Calcium: 9 mg/dL (ref 8.9–10.3)
Chloride: 101 mmol/L (ref 98–111)
Creatinine, Ser: 6.87 mg/dL — ABNORMAL HIGH (ref 0.44–1.00)
GFR, Estimated: 6 mL/min — ABNORMAL LOW (ref >60)
Glucose, Bld: 70 mg/dL (ref 70–99)
Phosphorus: 2.9 mg/dL (ref 2.5–4.6)
Potassium: 5.5 mmol/L — ABNORMAL HIGH (ref 3.5–5.1)
Sodium: 138 mmol/L (ref 135–145)

## 2023-08-27 LAB — PROTIME-INR
INR: 0.9 (ref 0.8–1.2)
Prothrombin Time: 12 s (ref 11.4–15.2)

## 2023-08-27 LAB — ETHANOL: Alcohol, Ethyl (B): <10 mg/dL (ref <10)

## 2023-08-27 MED ORDER — CALCIUM ACETATE (PHOS BINDER) 667 MG PO CAPS
667.0000 mg | ORAL_CAPSULE | Freq: Three times a day (TID) | ORAL | Status: DC
  Administered 2023-08-28 – 2023-08-29 (×4): 667 mg via ORAL
  Filled 2023-08-27 (×6): qty 1

## 2023-08-27 MED ORDER — INSULIN ASPART 100 UNIT/ML IV SOLN
8.0000 [IU] | Freq: Once | INTRAVENOUS | Status: AC
  Administered 2023-08-27: 8 [IU] via INTRAVENOUS
  Filled 2023-08-27: qty 0.08

## 2023-08-27 MED ORDER — DEXTROSE 50 % IV SOLN
1.0000 | Freq: Once | INTRAVENOUS | Status: AC
  Administered 2023-08-27: 50 mL via INTRAVENOUS
  Filled 2023-08-27: qty 50

## 2023-08-27 MED ORDER — ATORVASTATIN CALCIUM 20 MG PO TABS
40.0000 mg | ORAL_TABLET | Freq: Every day | ORAL | Status: DC
  Administered 2023-08-28: 40 mg via ORAL
  Filled 2023-08-27: qty 2

## 2023-08-27 MED ORDER — ACETAMINOPHEN 325 MG PO TABS: 650.0000 mg | ORAL_TABLET | ORAL | Status: DC | PRN

## 2023-08-27 MED ORDER — LORAZEPAM 2 MG/ML IJ SOLN: 1.0000 mg | Freq: Once | INTRAMUSCULAR | Status: DC | PRN

## 2023-08-27 MED ORDER — STROKE: EARLY STAGES OF RECOVERY BOOK: Freq: Once | Status: DC

## 2023-08-27 MED ORDER — SODIUM CHLORIDE 0.9% FLUSH
3.0000 mL | Freq: Once | INTRAVENOUS | Status: AC
  Administered 2023-08-27: 3 mL via INTRAVENOUS

## 2023-08-27 MED ORDER — ALTEPLASE 2 MG IJ SOLR: 2.0000 mg | Freq: Once | INTRAMUSCULAR | Status: DC | PRN

## 2023-08-27 MED ORDER — NEPRO/CARBSTEADY PO LIQD: 237.0000 mL | ORAL | Status: DC | PRN

## 2023-08-27 MED ORDER — ANTICOAGULANT SODIUM CITRATE 4% (200MG/5ML) IV SOLN
5.0000 mL | Status: DC | PRN
  Filled 2023-08-27: qty 5

## 2023-08-27 MED ORDER — SENNOSIDES-DOCUSATE SODIUM 8.6-50 MG PO TABS: 1.0000 | ORAL_TABLET | Freq: Every evening | ORAL | Status: DC | PRN

## 2023-08-27 MED ORDER — DEXTROSE 50 % IV SOLN
25.0000 g | Freq: Once | INTRAVENOUS | Status: AC
  Administered 2023-08-27: 25 g via INTRAVENOUS
  Filled 2023-08-27: qty 50

## 2023-08-27 MED ORDER — HEPARIN SODIUM (PORCINE) 1000 UNIT/ML DIALYSIS: 1000.0000 [IU] | INTRAMUSCULAR | Status: DC | PRN

## 2023-08-27 MED ORDER — INSULIN ASPART 100 UNIT/ML IV SOLN
5.0000 [IU] | Freq: Once | INTRAVENOUS | Status: DC
  Filled 2023-08-27: qty 0.05

## 2023-08-27 MED ORDER — ACETAMINOPHEN 650 MG RE SUPP: 650.0000 mg | RECTAL | Status: DC | PRN

## 2023-08-27 MED ORDER — SODIUM ZIRCONIUM CYCLOSILICATE 10 G PO PACK
10.0000 g | PACK | Freq: Once | ORAL | Status: AC
  Administered 2023-08-27: 10 g via ORAL
  Filled 2023-08-27: qty 1

## 2023-08-27 MED ORDER — INSULIN ASPART PROT & ASPART (70-30 MIX) 100 UNIT/ML ~~LOC~~ SUSP
4.0000 [IU] | Freq: Two times a day (BID) | SUBCUTANEOUS | Status: DC
Start: 2023-08-28 — End: 2023-08-28

## 2023-08-27 MED ORDER — INSULIN ASPART 100 UNIT/ML IJ SOLN
0.0000 [IU] | Freq: Three times a day (TID) | INTRAMUSCULAR | Status: DC
  Administered 2023-08-28: 5 [IU] via SUBCUTANEOUS
  Administered 2023-08-28: 1 [IU] via SUBCUTANEOUS
  Administered 2023-08-29: 2 [IU] via SUBCUTANEOUS
  Filled 2023-08-27 (×3): qty 1

## 2023-08-27 MED ORDER — HYDRALAZINE HCL 20 MG/ML IJ SOLN
10.0000 mg | INTRAMUSCULAR | Status: DC | PRN
  Filled 2023-08-27: qty 1

## 2023-08-27 MED ORDER — INSULIN ASPART 100 UNIT/ML IJ SOLN: 0.0000 [IU] | Freq: Every day | INTRAMUSCULAR | Status: DC

## 2023-08-27 MED ORDER — ONDANSETRON HCL 4 MG/2ML IJ SOLN: 4.0000 mg | Freq: Three times a day (TID) | INTRAMUSCULAR | Status: DC | PRN

## 2023-08-27 MED ORDER — HYDRALAZINE HCL 20 MG/ML IJ SOLN: 10.0000 mg | INTRAMUSCULAR | Status: DC | PRN

## 2023-08-27 MED ORDER — PENTAFLUOROPROP-TETRAFLUOROETH EX AERO: 1.0000 | INHALATION_SPRAY | CUTANEOUS | Status: DC | PRN

## 2023-08-27 MED ORDER — ACETAMINOPHEN 160 MG/5ML PO SOLN: 650.0000 mg | ORAL | Status: DC | PRN

## 2023-08-27 MED ORDER — AMLODIPINE BESYLATE 10 MG PO TABS
10.0000 mg | ORAL_TABLET | Freq: Every day | ORAL | Status: DC
  Administered 2023-08-28 – 2023-08-29 (×2): 10 mg via ORAL
  Filled 2023-08-27: qty 2
  Filled 2023-08-27: qty 1

## 2023-08-27 MED ORDER — HYDROXYZINE HCL 25 MG PO TABS: 25.0000 mg | ORAL_TABLET | Freq: Two times a day (BID) | ORAL | Status: DC | PRN

## 2023-08-27 MED ORDER — CHLORHEXIDINE GLUCONATE CLOTH 2 % EX PADS
6.0000 | MEDICATED_PAD | Freq: Every day | CUTANEOUS | Status: DC
  Filled 2023-08-27: qty 6

## 2023-08-27 MED ORDER — LIDOCAINE-PRILOCAINE 2.5-2.5 % EX CREA: 1.0000 | TOPICAL_CREAM | CUTANEOUS | Status: DC | PRN

## 2023-08-27 MED ORDER — LIDOCAINE HCL (PF) 1 % IJ SOLN: 5.0000 mL | INTRAMUSCULAR | Status: DC | PRN

## 2023-08-27 MED ORDER — DEXTROSE 50 % IV SOLN: 1.0000 | Freq: Once | INTRAVENOUS | Status: DC

## 2023-08-27 MED ORDER — CALCIUM GLUCONATE-NACL 1-0.675 GM/50ML-% IV SOLN
1.0000 g | Freq: Once | INTRAVENOUS | Status: AC
  Administered 2023-08-27: 1000 mg via INTRAVENOUS
  Filled 2023-08-27: qty 50

## 2023-08-27 MED ORDER — HEPARIN SODIUM (PORCINE) 5000 UNIT/ML IJ SOLN
5000.0000 [IU] | Freq: Three times a day (TID) | INTRAMUSCULAR | Status: DC
  Administered 2023-08-27 – 2023-08-29 (×5): 5000 [IU] via SUBCUTANEOUS
  Filled 2023-08-27 (×5): qty 1

## 2023-08-27 MED ORDER — ASPIRIN 81 MG PO TBEC
81.0000 mg | DELAYED_RELEASE_TABLET | Freq: Every day | ORAL | Status: DC
  Administered 2023-08-28 – 2023-08-29 (×2): 81 mg via ORAL
  Filled 2023-08-27 (×2): qty 1

## 2023-08-27 MED ORDER — IRBESARTAN 75 MG PO TABS
75.0000 mg | ORAL_TABLET | Freq: Every day | ORAL | Status: DC
  Filled 2023-08-27: qty 1

## 2023-08-27 MED ORDER — HYDRALAZINE HCL 50 MG PO TABS
100.0000 mg | ORAL_TABLET | Freq: Three times a day (TID) | ORAL | Status: DC
  Administered 2023-08-27 – 2023-08-29 (×5): 100 mg via ORAL
  Filled 2023-08-27 (×5): qty 2

## 2023-08-27 NOTE — ED Provider Notes (Addendum)
[facility] Provider Note    Event Date/Time   First MD Initiated Contact with Patient 08/27/23 1850     (approximate)   History   Hypertension   HPI  Anita Contreras is a 67 y.o. female   Past medical history of ESRD on HD TTS, diabetes, diabetic retinopathy, hypertension, hyperlipidemia, neuropathy, and prior cerebellar hemorrhage remotely here for stroke code.  Sudden onset of headache, nausea and vomiting, numbness over the entirety of body with last known normal around 3 PM.  Witnessed by daughter.  Checked blood pressure which was systolic 240.  Supposedly took blood pressure medications.  Came as a stroke code, immediately to CT scanner, neurologist evaluation.  Hypertensive.  However most of symptomatology as above had resolved with some residual numbness and reported to me bilateral leg weakness.  Denies any other recent medical illnesses, had a similar occurrence of headache nausea and vomiting 2 days ago that self resolved.  She says she feels this way when her blood pressure is high.  No chest pain, shortness of breath, respiratory infectious symptoms, GI or GU complaints.  Has been compliant with hemodialysis with last session performed as scheduled on Tuesday.  History was obtained via Spanish interpreter  Independent Historian contributed to assessment above: Daughter is at bedside to corroborate information past medical history as above     Physical Exam   Triage Vital Signs: ED Triage Vitals [08/27/23 1833]  Encounter Vitals Group     BP (!) 232/71     Systolic BP Percentile      Diastolic BP Percentile      Pulse Rate 75     Resp 16     Temp 98.5 F (36.9 C)     Temp Source Oral     SpO2 97 %     Weight      Height      Head Circumference      Peak Flow      Pain Score      Pain Loc      Pain Education      Exclude from Growth Chart     Most recent vital signs: Vitals:   08/27/23 1930 08/27/23 2006  BP: (!)  141/81 (!) 201/63  Pulse: 72 69  Resp:  14  Temp:    SpO2: 97% 98%    General: Awake, no distress.  CV:  Good peripheral perfusion.  Resp:  Normal effort.  Abd:  No distention.  Other:  Slow to respond.  Answering questions appropriately.  Moves all extremities.  However slow to move the lower extremities bilaterally but is able to raise against gravity.  Sensation intact no facial asymmetry no dysarthria.  Soft nontender abdomen clear lungs.  Hypertensive 200s   ED Results / Procedures / Treatments   Labs (all labs ordered are listed, but only abnormal results are displayed) Labs Reviewed  CBC - Abnormal; Notable for the following components:      Result Value   Platelets 147 (*)    All other components within normal limits  DIFFERENTIAL - Abnormal; Notable for the following components:   Abs Immature Granulocytes 0.08 (*)    All other components within normal limits  CBG MONITORING, ED - Abnormal; Notable for the following components:   Glucose-Capillary 150 (*)    All other components within normal limits  CBG MONITORING, ED - Abnormal; Notable for the following components:   Glucose-Capillary 147 (*)    All other components within  normal limits  PROTIME-INR  APTT  COMPREHENSIVE METABOLIC PANEL  ETHANOL  URINALYSIS, W/ REFLEX TO CULTURE (INFECTION SUSPECTED)  I-STAT CREATININE, ED     I ordered and reviewed the above labs they are notable for blood glucose 150  EKG  ED ECG REPORT I, Pilar Jarvis, the attending physician, personally viewed and interpreted this ECG.   Date: 08/27/2023  EKG Time: 1904  Rate: 71  Rhythm: sinus  Axis: nl  Intervals: none  ST&T Change: no stemi    RADIOLOGY I independently reviewed and interpreted CT of the head see no obvious bleeding or midline shift I also reviewed radiologist's formal read.   PROCEDURES:  Critical Care performed: Yes, see critical care procedure note(s)  .Critical Care  Performed by: Pilar Jarvis,  MD Authorized by: Pilar Jarvis, MD   Critical care provider statement:    Critical care time (minutes):  30   Critical care was time spent personally by me on the following activities:  Development of treatment plan with patient or surrogate, discussions with consultants, evaluation of patient's response to treatment, examination of patient, ordering and review of laboratory studies, ordering and review of radiographic studies, ordering and performing treatments and interventions, pulse oximetry, re-evaluation of patient's condition and review of old charts    MEDICATIONS ORDERED IN ED: Medications  LORazepam (ATIVAN) injection 1 mg (has no administration in time range)  sodium chloride flush (NS) 0.9 % injection 3 mL (3 mLs Intravenous Given 08/27/23 2005)    External physician / consultants:  I spoke with Wilford Corner of neurology regarding care plan for this patient.   IMPRESSION / MDM / ASSESSMENT AND PLAN / ED COURSE  I reviewed the triage vital signs and the nursing notes.                                Patient's presentation is most consistent with acute presentation with potential threat to life or bodily function.  Differential diagnosis includes, but is not limited to, CVA, ICH, hypertensive emergency, electrolyte disturbance   The patient is on the cardiac monitor to evaluate for evidence of arrhythmia and/or significant heart rate changes.  MDM:    Patient with acute onset neurologic symptoms including headache, nausea vomiting, whole body numbness, now much improved, blood pressure markedly high.  Stroke code called, CT normal proceed with MRI and await the results of MRI prior to further treatment.  If MRI positive for stroke, reconsult neurology for further management.  If negative for stroke can manage blood pressure as hypertensive emergency.  Admission to hospitalist service.      FINAL CLINICAL IMPRESSION(S) / ED DIAGNOSES   Final diagnoses:  Hypertensive crisis   Paresthesia  Weakness of both lower extremities  Altered mental status, unspecified altered mental status type  Nausea and vomiting, unspecified vomiting type     Rx / DC Orders   ED Discharge Orders     None        Note:  This document was prepared using Dragon voice recognition software and may include unintentional dictation errors.    Pilar Jarvis, MD 08/27/23 Jacklynn Barnacle    Pilar Jarvis, MD 08/27/23 [phone]

## 2023-08-27 NOTE — Progress Notes (Signed)
   08/27/23 1845  Spiritual Encounters  Type of Visit Initial  Care provided to: Pt and family  Conversation partners present during encounter Nurse  Referral source Code page  Reason for visit Code  OnCall Visit Yes   Chaplain responded to code stroke and provided care to daughter and son-in-law through compassionate presence and reflective listening.

## 2023-08-27 NOTE — ED Notes (Signed)
Patient transported to CT 

## 2023-08-27 NOTE — ED Notes (Signed)
Pt left to MRI

## 2023-08-27 NOTE — ED Triage Notes (Signed)
Pt here because she c/o numbness all over. Daughter checked her BP and it was 240s systolic. Pt reports compliance with her BP medication. Pt gets dialysis T Th Sa, last session was yesterday. Pt has L arm drift and flattened nasolabial fold to L side of face. Legs seem to be equal in strength. Daughter reports pt vomited at 1500. Pt reports 1500 is when she started feeling numb and weak.

## 2023-08-27 NOTE — H&P (Signed)
History and Physical    Anita Contreras:[id number] DOB: 1956/05/22 DOA: 08/27/2023  Referring MD/NP/PA:   PCP: Abram Sander, MD   Patient coming from:  The patient is coming from home.     Chief Complaint: Numbness and weakness  HPI: Anita Contreras is a 67 y.o. female with medical history significant of ESRD-HD (TTS), HTN, ICH, HTN, HLD, DM, dCHF, migraine, who presents with numbness and weakness.  Per her daughter at the bedside, patient's symptoms started at about 3:00 PM.  She complains of numbness in bilateral face, fingers and toes.  She reports weakness in both legs and both arms.  No vision loss or hearing loss.  Denies chest pain, cough, shortness of breath.  No fever or chills.  Patient has nausea and vomited once, no diarrhea.  Patient had some abdominal discomfort earlier, which has resolved.  Currently no abdominal pain.  No symptoms of UTI.  She had dialysis yesterday. Pt was found to have elevated blood pressure 201/63 in ED.   Data reviewed independently and ED Course: pt was found to have hyperkalemia with potassium 6.3 and T wave peaking on EKG, bicarbonate 28, BUN 66, creatinine 6.44, WBC 5.9, alcohol level less than 10, temperature normal, heart rate 75, RR 16, oxygen saturation 98% on room air.  CT of head negative.  MRI of the brain negative for stroke. Pt is placed on telemetry bed for observation. Dr. Wynelle Link of renal and Dr. Wilford Corner of neurology are consulted.   MRI-barin 1. No acute intracranial process. No evidence of acute or subacute infarct. 2. T2 hyperintense collection along the posterior aspect of the right globe, which was present on the 12/11/2022 exam and correlates with some thickening along the posterior aspect of the globe on CT. This could indicate retinal detachment. Correlate with ophthalmologic exam.   EKG: I have personally reviewed.  Sinus rhythm, QTc 490, LAE, LAD, poor R wave progression, T wave peaking    Review  of Systems:   General: no fevers, chills, no body weight gain, has fatigue HEENT: no blurry vision, hearing changes or sore throat Respiratory: no dyspnea, coughing, wheezing CV: no chest pain, no palpitations GI: had nausea, vomiting, abdominal pain, no diarrhea, constipation GU: no dysuria, burning on urination, increased urinary frequency, hematuria  Ext: no leg edema Neuro: no vision change or hearing loss. Has numbness in bilateral face, fingers and toes. Has weakness in both legs and both arms.  Skin: no rash, no skin tear. MSK: No muscle spasm, no deformity, no limitation of range of movement in spin Heme: No easy bruising.  Travel history: No recent long distant travel.   Allergy:  Allergies  Allergen Reactions   Dextromethorphan-Guaifenesin     Other reaction(s): Itching of skin (finding)   Propofol Itching    Pt says " when getting anesthesia by body itches" unsure what type of anesthesia    Past Medical History:  Diagnosis Date   Acute cystitis with hematuria    Anemia    Cataracts, bilateral    CKD (chronic kidney disease)    Diabetic retinopathy, background (HCC)    Dialysis patient (HCC)    Tues, Thurs, Sat   Dizziness    Hyperlipidemia    Hypertension    Migraine variant    Neuropathy    OP (osteoporosis)    Personal history of noncompliance with medical treatment, presenting hazards to health    Steal syndrome as complication of dialysis access [facility])    Stroke (HCC)  Type II diabetes mellitus with ophthalmic manifestations, uncontrolled    Vitamin D deficiency     Past Surgical History:  Procedure Laterality Date   A/V FISTULAGRAM Right 06/03/2018   Procedure: A/V FISTULAGRAM;  Surgeon: Annice Needy, MD;  Location: [facility] INV[facility]IVE CV [facility];  Service: Cardiovascular;  Laterality: Right;   A/V FISTULAGRAM Right 02/22/2019   Procedure: A/V FISTULAGRAM;  Surgeon: Annice Needy, MD;  Location: [facility];  Service: Cardiovascular;  Laterality:  Right;   A/V FISTULAGRAM Right 08/09/2021   Procedure: A/V FISTULAGRAM;  Surgeon: Annice Needy, MD;  Location: [facility];  Service: Cardiovascular;  Laterality: Right;   A/V SHUNT INTERVENTION N/A 01/26/2018   Procedure: A/V SHUNT INTERVENTION;  Surgeon: Annice Needy, MD;  Location: [facility];  Service: Cardiovascular;  Laterality: N/A;   A/V SHUNTOGRAM Right 01/26/2018   Procedure: A/V SHUNTOGRAM;  Surgeon: Annice Needy, MD;  Location: [facility];  Service: Cardiovascular;  Laterality: Right;   AV FISTULA PLACEMENT Right 10/10/2016   Procedure: ARTERIOVENOUS (AV) FISTULA CREATION ( BRACHIOCEPHALIC );  Surgeon: Annice Needy, MD;  Location: [facility] ORS;  Service: Vascular;  Laterality: Right;   DILATION AND CURETTAGE OF UTERUS     DISTAL REVASCULARIZATION AND INTERVAL LIGATION (DRIL) Right 11/08/2016   Procedure: DISTAL REVASCULARIZATION AND INTERVAL LIGATION (DRIL);  Surgeon: Annice Needy, MD;  Location: [facility] ORS;  Service: Vascular;  Laterality: Right;   PERIPHERAL VASCULAR CATHETERIZATION N/A 04/25/2016   Procedure: Dialysis/Perma Catheter Insertion;  Surgeon: Annice Needy, MD;  Location: [facility];  Service: Cardiovascular;  Laterality: N/A;   PERIPHERAL VASCULAR CATHETERIZATION Right 10/17/2016   Procedure: Upper Extremity Angiography;  Surgeon: Annice Needy, MD;  Location: [facility];  Service: Cardiovascular;  Laterality: Right;   UPPER EXTREMITY ANGIOGRAPHY Right 10/12/2018   Procedure: UPPER EXTREMITY ANGIOGRAPHY;  Surgeon: Annice Needy, MD;  Location: [facility];  Service: Cardiovascular;  Laterality: Right;    Social History:  reports that she has never smoked. She has never used smokeless tobacco. She reports that she does not drink alcohol and does not use drugs.  Family History:  Family History  Problem Relation Age of Onset   Hypertension Mother    Hyperlipidemia Mother      Prior to Admission medications   Medication Sig  Start Date End Date Taking? Authorizing Provider  promethazine (PHENERGAN) 12.5 MG tablet Take 12.5 mg by mouth every 6 (six) hours as needed for nausea or vomiting. 05/13/23  Yes [provider]  valsartan (DIOVAN) 40 MG tablet Take 40 mg by mouth daily. 08/12/23  Yes [provider]  acetaminophen (TYLENOL) 500 MG tablet Take 1,000 mg by mouth every 6 (six) hours as needed for mild pain or headache.    [provider]  amLODipine (NORVASC) 10 MG tablet Take 1 tablet (10 mg total) by mouth daily. 01/27/18   Adrian Saran, MD  aspirin 81 MG EC tablet TAKE 1 TABLET BY MOUTH EVERY DAY 04/15/17   Stegmayer, Cala Bradford A, PA-C  atorvastatin (LIPITOR) 40 MG tablet Take 40 mg by mouth daily at 6 PM.    [provider]  calcium acetate (PHOSLO) 667 MG capsule Take 1 capsule (667 mg total) by mouth 3 (three) times daily with meals. 04/19/16   Enedina Finner, MD  hydrALAZINE (APRESOLINE) 100 MG tablet Take 1 tablet (100 mg total) by mouth every 8 (eight) hours. 08/31/21   Alvino Chapel,  Victorino Dike, DO  hydrOXYzine (ATARAX/VISTARIL) 50 MG tablet Take 25 mg by mouth 2 (two) times daily as needed for itching.    [provider]  insulin NPH-regular Human (70-30) 100 UNIT/ML injection Inject 5 Units into the skin 2 (two) times daily with a meal. 12/11/22   Renae Gloss, Richard, MD  lidocaine-prilocaine (EMLA) cream Apply 1 application  topically as needed.    [provider]  pantoprazole (PROTONIX) 40 MG tablet Take 1 tablet (40 mg total) by mouth daily. 08/12/20   Alford Highland, MD  predniSONE (DELTASONE) 10 MG tablet 4 tabs po day1; 3 tabs po day2,3; 2 tabs po day4,5; 1 tab po day6,7,8; 1/2 tab po day9,10,11,12 12/11/22   Alford Highland, MD    Physical Exam: Vitals:   08/27/23 2245 08/27/23 2300 08/27/23 2329 08/27/23 2330  BP:  (!) 215/74 (!) 215/74 (!) 209/75  Pulse:  71  71  Resp:    18  Temp: 98.1 F (36.7 C)     TempSrc: Oral     SpO2:  100%  100%   General: Not in acute  distress HEENT:       Eyes: PERRL, EOMI, no jaundice       ENT: No discharge from the ears and nose, no pharynx injection, no tonsillar enlargement.        Neck: No JVD, no bruit, no mass felt. Heme: No neck lymph node enlargement. Cardiac: S1/S2, RRR, No murmurs, No gallops or rubs. Respiratory: No rales, wheezing, rhonchi or rubs. GI: Soft, nondistended, nontender, no rebound pain, no organomegaly, BS present. GU: No hematuria Ext: No pitting leg edema bilaterally. 1+DP/PT pulse bilaterally. Musculoskeletal: No joint deformities, No joint redness or warmth, no limitation of ROM in spin. Skin: No rashes.  Neuro: Alert, oriented X3, cranial nerves II-XII grossly intact, moves all extremities normally. Muscle strength 5/5 in all extremities, sensation to light touch intact.  Psych: Patient is not psychotic, no suicidal or hemocidal ideation.  Labs on Admission: I have personally reviewed following labs and imaging studies  CBC: Recent Labs  Lab 08/27/23 1952 08/27/23 2214  WBC 5.9 4.9  NEUTROABS 4.2  --   HGB 13.1 11.0*  HCT 39.2 34.1*  MCV 96.8 99.7  PLT 147* 126*   Basic Metabolic Panel: Recent Labs  Lab 08/27/23 1952 08/27/23 2214  NA 139 138  K 6.3* 5.5*  CL 98 101  CO2 28 27  GLUCOSE 128* 70  BUN 66* 68*  CREATININE 6.44* 6.87*  CALCIUM 9.4 9.0  PHOS  --  2.9   GFR: CrCl cannot be calculated (Unknown ideal weight.). Liver Function Tests: Recent Labs  Lab 08/27/23 1952 08/27/23 2214  AST 30  --   ALT 14  --   ALKPHOS 183*  --   BILITOT 0.9  --   PROT 7.9  --   ALBUMIN 4.8 3.5   No results for input(s): "LIPASE", "AMYLASE" in the last 168 hours. No results for input(s): "AMMONIA" in the last 168 hours. Coagulation Profile: Recent Labs  Lab 08/27/23 1952  INR 0.9   Cardiac Enzymes: No results for input(s): "CKTOTAL", "CKMB", "CKMBINDEX", "TROPONINI" in the last 168 hours. BNP (last 3 results) No results for input(s): "PROBNP" in the last 8760  hours. HbA1C: No results for input(s): "HGBA1C" in the last 72 hours. CBG: Recent Labs  Lab 08/27/23 1844 08/27/23 1858 08/27/23 2237 08/27/23 2316  GLUCAP 150* 147* 44* 171*   Lipid Profile: No results for input(s): "CHOL", "HDL", "LDLCALC", "TRIG", "CHOLHDL", "  LDLDIRECT" in the last 72 hours. Thyroid Function Tests: No results for input(s): "TSH", "T4TOTAL", "FREET4", "T3FREE", "THYROIDAB" in the last 72 hours. Anemia Panel: No results for input(s): "VITAMINB12", "FOLATE", "FERRITIN", "TIBC", "IRON", "RETICCTPCT" in the last 72 hours. Urine analysis:    Component Value Date/Time   COLORURINE YELLOW (A) 08/07/2020 0858   APPEARANCEUR CLEAR (A) 08/07/2020 0858   LABSPEC 1.013 08/07/2020 0858   PHURINE 8.0 08/07/2020 0858   GLUCOSEU NEGATIVE 08/07/2020 0858   HGBUR NEGATIVE 08/07/2020 0858   BILIRUBINUR NEGATIVE 08/07/2020 0858   KETONESUR NEGATIVE 08/07/2020 0858   PROTEINUR >=300 (A) 08/07/2020 0858   NITRITE NEGATIVE 08/07/2020 0858   LEUKOCYTESUR NEGATIVE 08/07/2020 0858   Sepsis Labs: @LABRCNTIP (procalcitonin:4,lacticidven:4) )No results found for this or any previous visit (from the past 240 hour(s)).   Radiological Exams on Admission: [name]  Result Date: 08/27/2023 CLINICAL DATA:  Stroke suspected, numbness all over, hypertension EXAM: MRI HEAD WITHOUT CONTRAST TECHNIQUE: Multiplanar, multiecho pulse sequences of the brain and surrounding structures were obtained without intravenous contrast. COMPARISON:  12/11/2022 MRI head correlation is also made with 08/27/2023 CT head FINDINGS: Brain: No restricted diffusion to suggest acute or subacute infarct. No acute hemorrhage, mass, mass effect, or midline shift. No hydrocephalus or extra-axial collection. Normal pituitary and craniocervical junction. Punctate foci of hemosiderin deposition in the bilateral frontal lobes and right cerebellar hemisphere, likely sequela prior hypertensive microhemorrhage. Normal  cerebral volume for age. Vascular: Normal arterial flow voids. Skull and upper cervical spine: Normal marrow signal. Sinuses/Orbits: Minimal mucosal thickening in the ethmoid air cells. T2 hyperintense collection along the posterior aspect of the right globe, which does not suppress on FLAIR (series 8, image 9 and series 9, image 16), which was present on the 12/11/2022 exam and correlates with some thickening along the posterior aspect of the globe on CT. Other: The mastoid air cells are well aerated. IMPRESSION: 1. No acute intracranial process. No evidence of acute or subacute infarct. 2. T2 hyperintense collection along the posterior aspect of the right globe, which was present on the 12/11/2022 exam and correlates with some thickening along the posterior aspect of the globe on CT. This could indicate retinal detachment. Correlate with ophthalmologic exam. Electronically Signed   By: Wiliam Ke M.D.   On: 08/27/2023 22:21   CT HEAD CODE STROKE WO CONTRAST  Result Date: 08/27/2023 CLINICAL DATA:  Code stroke.  Neuro deficit, acute, stroke suspected EXAM: CT HEAD WITHOUT CONTRAST TECHNIQUE: Contiguous axial images were obtained from the base of the skull through the vertex without intravenous contrast. RADIATION DOSE REDUCTION: This exam was performed according to the departmental dose-optimization program which includes automated exposure control, adjustment of the mA and/or kV according to patient size and/or use of iterative reconstruction technique. COMPARISON:  CT head 12/09/2022. FINDINGS: Brain: No evidence of acute large vascular territory infarction, hemorrhage, hydrocephalus, extra-axial collection or mass lesion/mass effect. Vascular: No hyperdense vessel identified. Skull: No acute fracture. Sinuses/Orbits: Clear sinuses.  No acute orbital findings. Other: No mastoid effusions. ASPECTS [facility] Stroke Program Early CT Score) total score (0-10 with 10 being normal): 10. IMPRESSION: 1. No evidence  of acute intracranial abnormality. 2. ASPECTS is 10 Code stroke imaging results were communicated on 08/27/2023 at 6:56 pm to provider Dr. Wilford Corner via secure text paging. Electronically Signed   By: Feliberto Harts M.D.   On: 08/27/2023 18:57      Assessment/Plan Active Problems:   Hypertensive urgency   Stroke [facility])   Type 2 diabetes  mellitus with ESRD (end-stage renal disease) (HCC)   Hyperkalemia   ESRD on hemodialysis (HCC)   Essential hypertension   Chronic diastolic CHF (congestive heart failure) (HCC)   Dyslipidemia   Mild protein-calorie malnutrition (HCC)   Assessment and Plan:  Hypertensive urgency and history of hypertension: Blood pressure 201/63. pt complains of numbness in bilateral face, fingers and toes.  She reports weakness in both legs and both arms. MRI of brain is negative. Consulted Dr. Wilford Corner of neuro, who recommend to treat patient as hypertensive urgency if MRI of the brain is negative for stroke.  -Placed on head of bed for observation -As needed IV hydralazine for SBP > 165. -Amlodipine, oral hydralazine 100 mg 3 times daily, -Switch Diovan to irbesartan  History of stroke (HCC) -Continue aspirin, Lipitor  Type 2 diabetes mellitus with ESRD (end-stage renal disease) (HCC): Recent A1c 5.1, well-controlled.  Patient still taking NPH insulin 70/30 insulin, 5 units twice daily -70/30 insulin, 4 units twice daily  Hyperkalemia: Potassium 6.3, with T wave peaking in EKG -Patient was treated with 1 g calcium gluconate, D50, NovoLog 8 units and 10 g Loklema in ED -Consulted Dr. Wynelle Link of renal for dialysis  ESRD on hemodialysis (TTS) -Consulted Dr. Wynelle Link of renal for dialysis  Chronic diastolic CHF (congestive heart failure) (HCC): 2D echo on 02/11/2021 showed EF of 55 to 60% with grade 2 diastolic dysfunction.  Patient does not have leg edema JVD.  CHF seem to be compensated. -Volume management per renal by dialysis  Dyslipidemia -Lipitor  Mild  protein-calorie malnutrition (HCC): Body weight 46 kg, BMI 18.5 -Consult nutrition  Possible retinal detachment: MRI of brain showed T2 hyperintense collection along the posterior aspect of the right globe, which was present on the 12/11/2022 exam and correlates with some thickening along the posterior aspect of the globe on CT. This could indicate retinal detachment.  -will need to consult ophthalmology in the morning or follow-up with ophthalmologist     DVT ppx: SQ Heparin       Code Status: Full code    Family Communication:   Yes, patient's daughter and son-in-law at bed side.   Disposition Plan:  Anticipate discharge back to previous environment  Consults called: Dr. Wynelle Link of renal and Dr. Wilford Corner of neurology  Admission status and Level of care: Telemetry Cardiac:  for obs   Dispo: The patient is from: Home              Anticipated d/c is to: Home              Anticipated d/c date is: 1 day              Patient currently is not medically stable to d/c.    Severity of Illness:  The appropriate patient status for this patient is OBSERVATION. Observation status is judged to be reasonable and necessary in order to provide the required intensity of service to ensure the patient's safety. The patient's presenting symptoms, physical exam findings, and initial radiographic and laboratory data in the context of their medical condition is felt to place them at decreased risk for further clinical deterioration. Furthermore, it is anticipated that the patient will be medically stable for discharge from the hospital within 2 midnights of admission.        Date of Service 08/27/2023    Lorretta Harp [facility]   If 7PM-7AM, please contact night-coverage [email or web address] 08/27/2023, 11:50 PM

## 2023-08-27 NOTE — ED Notes (Signed)
IV has been attempted 4 times by ER staff and is unsuccessful. MD notified.

## 2023-08-27 NOTE — Progress Notes (Signed)
1843 elert 1844 paged [facility], [place] on screen within seconds 1856 NCCT read relayed to [facility]  No tnk given.Patient headed to ct scan when we were paged (no cart times).

## 2023-08-27 NOTE — ED Notes (Signed)
CODE  STROKE  CALLED  TO  Anita Contreras

## 2023-08-27 NOTE — Consult Note (Signed)
[facility] Telemedicine Consult   Requesting Provider: Dr.Wong Consult Participants: Dr. Marthe Patch, Telespecialist RN Tresa Endo   bedside RN Herbert Seta Location of the provider: Home Location of the patient: [place] [facility] bed 16  This consult was provided via telemedicine with 2-way video and audio communication. The patient/family was informed that care would be provided in this way and agreed to receive care in this manner.   Chief Complaint: Overall body numbness, question left arm drift and left facial weakness  HPI: 67 year old with history of ESRD on HD TTS, diabetes, diabetic retinopathy, hypertension, hyperlipidemia, neuropathy, prior cerebellar hemorrhage many years ago, presenting for evaluation of sudden onset of headache, numbness all over the body, vomiting.  Last known well is somewhere around 3 PM.  When her daughter got to her house she complained to her daughter about the nausea and vomiting.  The daughter took her blood pressure and it was systolic 240.  She asked her to take her pills which the patient did.  On repeat check, the machine could not read the pressures and said too high.  She called EMS who brought the patient into the ER.  Initial triage evaluation with concern for left arm and left face weakness for which code stroke was activated. On my evaluation-no focal deficits-see exam below. Daughter reports compliance. Daughter served as the interpreter as the patient is Spanish-speaking only.   Past Medical History:  Diagnosis Date   Acute cystitis with hematuria    Anemia    Cataracts, bilateral    CKD (chronic kidney disease)    Diabetic retinopathy, background (HCC)    Dialysis patient (HCC)    Tues, Thurs, Sat   Dizziness    Hyperlipidemia    Hypertension    Migraine variant    Neuropathy    OP (osteoporosis)    Personal history of noncompliance with medical treatment, presenting hazards to health    Steal syndrome as complication of dialysis  access [facility])    Stroke (HCC)    Type II diabetes mellitus with ophthalmic manifestations, uncontrolled    Vitamin D deficiency      Current Facility-Administered Medications:    sodium chloride flush (NS) 0.9 % injection 3 mL, 3 mL, Intravenous, Once, Pilar Jarvis, MD  Current Outpatient Medications:    acetaminophen (TYLENOL) 500 MG tablet, Take 1,000 mg by mouth every 6 (six) hours as needed for mild pain or headache., Disp: , Rfl:    amLODipine (NORVASC) 10 MG tablet, Take 1 tablet (10 mg total) by mouth daily., Disp: 30 tablet, Rfl: 0   aspirin 81 MG EC tablet, TAKE 1 TABLET BY MOUTH EVERY DAY, Disp: 150 tablet, Rfl: 0   atorvastatin (LIPITOR) 40 MG tablet, Take 40 mg by mouth daily at 6 PM., Disp: , Rfl:    calcium acetate (PHOSLO) 667 MG capsule, Take 1 capsule (667 mg total) by mouth 3 (three) times daily with meals., Disp: 90 capsule, Rfl: 0   hydrALAZINE (APRESOLINE) 100 MG tablet, Take 1 tablet (100 mg total) by mouth every 8 (eight) hours., Disp: 90 tablet, Rfl: 2   hydrOXYzine (ATARAX/VISTARIL) 50 MG tablet, Take 25 mg by mouth 2 (two) times daily as needed for itching., Disp: , Rfl:    insulin NPH-regular Human (70-30) 100 UNIT/ML injection, Inject 5 Units into the skin 2 (two) times daily with a meal., Disp: 10 mL, Rfl: 11   lidocaine-prilocaine (EMLA) cream, Apply 1 application  topically as needed., Disp: , Rfl:    pantoprazole (PROTONIX)  40 MG tablet, Take 1 tablet (40 mg total) by mouth daily., Disp: 30 tablet, Rfl: 0   predniSONE (DELTASONE) 10 MG tablet, 4 tabs po day1; 3 tabs po day2,3; 2 tabs po day4,5; 1 tab po day6,7,8; 1/2 tab po day9,10,11,12, Disp: 19 tablet, Rfl: 0    LKW: 3 PM IV thrombolysis given?: No, symptoms consistent with hypertensive emergency and non focal exam. Would have rushed to MRI if exam was focal.  IR Thrombectomy? No, symptoms not consistent with LVO Modified Rankin Scale: 0-Completely asymptomatic and back to baseline post- stroke Time of  teleneurologist evaluation: 1844 hrs.  Exam: Vitals:   08/27/23 1833  BP: (!) 232/71  Pulse: 75  Resp: 16  Temp: 98.5 F (36.9 C)  SpO2: 97%   Exam with the help of interpreter-daughter General: Well-developed well-nourished in no distress HEENT: Normocephalic atraumatic Neurological exam Awake alert oriented x 3. No dysarthria No aphasia Cranial nerves II to XII intact with the exception of diminished sensation all over her face and scalp. Motor examination with no drift in any of the 4 extremities Sensation intact light touch without extinction Coordination examination with no dysmetria. NIH stroke scale 1 for sensory but that is bilateral sensory diminishment  Imaging Reviewed: Noncontrast head CT unremarkable.  Labs reviewed in epic and pertinent values follow: Pending.   Assessment: 67 year old with prior history of cerebellar hemorrhage, hypertension, ESRD on dialysis, diabetes with complications, presenting for evaluation of nausea vomiting headache and numbness all over along her face and head as well as fingers and toes. Her examination is nonfocal Symptoms likely related to hypertensive emergency but an MRI should be done to rule out small stroke versus posterior reversible encephalopathy syndrome  IMP Evaluate for HTN emergency, PRES Evaluate for small stroke - stroke work up only if MRI positive for stroke.  Recommendations:  From a neurological standpoint, I would recommend obtaining an MRI  - If positive for stroke, allow permissive hypertension and treat only if systolic blood pressures are greater than 220 on a as needed basis.  Also order full stroke workup including echo, CT angio head and neck, A1c, lipid panel, frequent neurochecks, telemetry and PT OT ST evaluations.  - If the MRI is negative for stroke, treat like hypertensive emergency and reduce blood pressures by 20% each day till normotension is achieved.  Please call if MRI is concerning for  posterior reversible encephalopathy syndrome.  Plan was discussed with Dr. Modesto Charon via secure chat. I will follow the patient if the MRI reveals stroke or posterior reversible encephalopathy syndrome.  -- Milon Dikes, MD Neurologist [facility] Pager: [phone]   CRITICAL CARE ATTESTATION Performed by: Milon Dikes, MD Total critical care time: 30 minutes Critical care time was exclusive of separately billable procedures and treating other patients and/or supervising APPs/Residents/Students Critical care was necessary to treat or prevent imminent or life-threatening deterioration. This patient is critically ill and at significant risk for neurological worsening and/or death and care requires constant monitoring. Critical care was time spent personally by me on the following activities: development of treatment plan with patient and/or surrogate as well as nursing, discussions with consultants, evaluation of patient's response to treatment, examination of patient, obtaining history from patient or surrogate, ordering and performing treatments and interventions, ordering and review of laboratory studies, ordering and review of radiographic studies, pulse oximetry, re-evaluation of patient's condition, participation in multidisciplinary rounds and medical decision making of high complexity in the care of this patient.

## 2023-08-28 DIAGNOSIS — R9089 Other abnormal findings on diagnostic imaging of central nervous system: Secondary | ICD-10-CM | POA: Diagnosis present

## 2023-08-28 LAB — BASIC METABOLIC PANEL WITH GFR
Anion gap: 12 (ref 5–15)
BUN: 27 mg/dL — ABNORMAL HIGH (ref 8–23)
CO2: 29 mmol/L (ref 22–32)
Calcium: 8.6 mg/dL — ABNORMAL LOW (ref 8.9–10.3)
Chloride: 90 mmol/L — ABNORMAL LOW (ref 98–111)
Creatinine, Ser: 3.76 mg/dL — ABNORMAL HIGH (ref 0.44–1.00)
GFR, Estimated: 13 mL/min — ABNORMAL LOW
Glucose, Bld: 140 mg/dL — ABNORMAL HIGH (ref 70–99)
Potassium: 5.1 mmol/L (ref 3.5–5.1)
Sodium: 131 mmol/L — ABNORMAL LOW (ref 135–145)

## 2023-08-28 LAB — BASIC METABOLIC PANEL
Anion gap: 10 (ref 5–15)
BUN: 72 mg/dL — ABNORMAL HIGH (ref 8–23)
CO2: 27 mmol/L (ref 22–32)
Calcium: 8.4 mg/dL — ABNORMAL LOW (ref 8.9–10.3)
Chloride: 100 mmol/L (ref 98–111)
Creatinine, Ser: 7.08 mg/dL — ABNORMAL HIGH (ref 0.44–1.00)
GFR, Estimated: 6 mL/min — ABNORMAL LOW (ref >60)
Glucose, Bld: 65 mg/dL — ABNORMAL LOW (ref 70–99)
Potassium: 5.7 mmol/L — ABNORMAL HIGH (ref 3.5–5.1)
Sodium: 137 mmol/L (ref 135–145)

## 2023-08-28 LAB — GLUCOSE, CAPILLARY
Glucose-Capillary: 147 mg/dL — ABNORMAL HIGH (ref 70–99)
Glucose-Capillary: 215 mg/dL — ABNORMAL HIGH (ref 70–99)

## 2023-08-28 LAB — HEMOGLOBIN A1C
Hgb A1c MFr Bld: 5.2 % (ref 4.8–5.6)
Mean Plasma Glucose: 103 mg/dL

## 2023-08-28 LAB — CBG MONITORING, ED
Glucose-Capillary: 90 mg/dL (ref 70–99)
Glucose-Capillary: 259 mg/dL — ABNORMAL HIGH (ref 70–99)

## 2023-08-28 LAB — HEPATITIS B SURFACE ANTIGEN: Hepatitis B Surface Ag: NONREACTIVE

## 2023-08-28 MED ORDER — INSULIN ASPART PROT & ASPART (70-30 MIX) 100 UNIT/ML ~~LOC~~ SUSP
3.0000 [IU] | Freq: Two times a day (BID) | SUBCUTANEOUS | Status: DC
Start: 2023-08-28 — End: 2023-08-28

## 2023-08-28 MED ORDER — IRBESARTAN 150 MG PO TABS
75.0000 mg | ORAL_TABLET | Freq: Every day | ORAL | Status: DC
  Administered 2023-08-28 – 2023-08-29 (×2): 75 mg via ORAL
  Filled 2023-08-28 (×2): qty 1

## 2023-08-28 NOTE — Progress Notes (Addendum)
Progress Note   Patient: Anita Contreras ZOX:[id number] DOB: Oct 17, 1956 DOA: 08/27/2023     0 DOS: the patient was seen and examined on 08/28/2023   Brief hospital course: HPI on admission 08/27/23:  "Shantele Bergeson is a 67 y.o. female with medical history significant of ESRD-HD (TTS), HTN, ICH, HTN, HLD, DM, dCHF, migraine, who presents with numbness and weakness.   Per her daughter at the bedside, patient's symptoms started at about 3:00 PM.  She complains of numbness in bilateral face, fingers and toes.  She reports weakness in both legs and both arms.  No vision loss or hearing loss.  Denies chest pain, cough, shortness of breath.  No fever or chills.  Patient has nausea and vomited once, no diarrhea.  Patient had some abdominal discomfort earlier, which has resolved.  Currently no abdominal pain.  No symptoms of UTI.  She had dialysis yesterday. Pt was found to have elevated blood pressure 201/63 in ED..."  In the ED -- labs showed potassium 6.3 and T wave peaking on EKG, BUN 66, creatinine 6.44, vitals stable except severely elevated BP.  CT of head negative.  MRI of the brain negative for stroke.   Pt admitted for further evaluation and management of hypertensive emergency/urgency.  Further hospital course and management as outlined below.   Assessment and Plan: * Hypertensive urgency BP initially 232/71 & presented with numbness and weakness as outlined in H&P.  Stroked ruled out with negative MRI. Presenting symptoms resolved with BP improvement and correction of hyperkalemia with dialysis. 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 Improved with D50. --Hold off  70/30 insulin --Sensitive sliding scale Novolog for now --Appreciate diabetes coordinator input --Hypoglycemia protocol  ESRD on hemodialysis (HCC) Hyperkalemia -resolved (K 6.3 on adm) Nephrology following for dialysis. Dialyzed this 130 this AM. Repeat BMP this afternoon - K 5.1  Chronic diastolic CHF (congestive heart failure) (HCC) Echo on 02/11/2021 showed EF of 55 to 60% with grade 2 diastolic dysfunction.  Patient does not have leg edema JVD.  CHF seem to be compensated. -Volume management per renal by dialysis  Essential hypertension See Hypertensive Urgency  Dyslipidemia -Lipitor   Mild protein-calorie malnutrition (HCC) Body weight 46 kg, BMI 18.5 -Consult nutrition   Abnormal finding on MRI of brain MRI of brain showed T2 hyperintense collection along the posterior aspect of the right globe, which was present on the 12/11/2022 exam and correlates with some thickening along the posterior aspect of the globe on CT. This could indicate retinal detachment  --Ophthalmology follow up needed        Subjective: Pt seen with daughter at bedside, holding for a bed in the ED.  She feels better, numbness and weakness feelings have resolved.  No acute complaints.   Physical Exam: Vitals:   08/28/23 1149 08/28/23 1326 08/28/23 1552 08/28/23 1608  BP: (!) 181/74 (!) 198/71 (!) 169/59 (!) 166/60  Pulse: 71  70 71  Resp: 16  16 16   Temp:    97.7 F (36.5 C)  TempSrc:    Oral  SpO2: 99%  100% 98%   General exam: awake, alert, no acute distress Respiratory system: CTAB, no wheezes, rales or rhonchi, normal respiratory effort.  Cardiovascular system: normal S1/S2, RRR, no JVD, murmurs, rubs, gallops, no pedal edema.   Gastrointestinal system: soft, NT, ND, no HSM felt, +bowel sounds. Central nervous system: A&O x3. no gross focal neurologic deficits, normal speech Extremities: moves all , no edema, normal tone Skin: dry, intact, normal temperature Psychiatry: normal mood,  congruent affect, judgement and insight appear normal   Data Reviewed:  Notable labs --- K normalized 5.1, Na 131, Cl 90, glucose 140, BUN 27, Cr 3.76, Ca 8.6 CBG's 90 >> 259 >> 147  Family Communication: daughter at bedside on rounds  Disposition: Status is: Inpatient Remains inpatient appropriate because: BP remains uncontrolled     Planned Discharge Destination: Home    Time spent: 48 minutes  Author: Pennie Banter, DO 08/28/2023 6:04 PM  For on call review [email or web address].

## 2023-08-28 NOTE — Assessment & Plan Note (Addendum)
Pt denies recent vision changes from baseline (blind in left eye, reduced but stable vision in right eye). MRI of brain showed T2 hyperintense collection along the posterior aspect of the right globe, which was present on the 12/11/2022 exam and correlates with some thickening along the posterior aspect of the globe on CT. This could indicate retinal detachment  --Ophthalmology follow up needed

## 2023-08-28 NOTE — Assessment & Plan Note (Addendum)
Hypoglycemia - CBG 44 Improved with D50.  We held 70/30 insulin and covered with sensitive sliding scale Novolog. --Appreciate diabetes coordinator input --Hypoglycemia protocol --Resume home regimen at discharge --Added instructions for 70/30 insulin to hold if glucose < 120 to prevent hypoglycemic episodes at home

## 2023-08-28 NOTE — Assessment & Plan Note (Addendum)
Hyperkalemia -resolved (K 6.3 on adm) Nephrology consulted for dialysis. --STOP ARB due to recurrent hyperkalemia --Resume outpatient dialysis tomorrow

## 2023-08-28 NOTE — Assessment & Plan Note (Signed)
See Hypertensive Urgency

## 2023-08-28 NOTE — Assessment & Plan Note (Addendum)
BP initially 232/71 & presented with numbness and weakness as outlined in H&P.  Stroked ruled out with negative MRI. Presenting symptoms resolved with BP improvement and correction of hyperkalemia with dialysis. 9/19 - note hypotensive this AM in dialysis, elevated BP's this afternoon --Amlodipine 10 mg daily - continue --Hydralazine 1000 mg TID - continue --STOP ARB due to recurrent hyperkalemia issues --Adding Imdur per Nephrology --Close outpatient follow up for ongoing BP management --Would avoid ARB's and spironolactone in this patient --PRN IV hydralazine

## 2023-08-28 NOTE — Assessment & Plan Note (Signed)
Body weight 46 kg, BMI 18.5 -Consult nutrition

## 2023-08-28 NOTE — Progress Notes (Signed)
   08/28/23 0149  Vitals  Temp 98.3 F (36.8 C)  Temp Source Oral  BP (!) 180/61  MAP (mmHg) 92  BP Location Right Arm  BP Method Automatic  Patient Position (if appropriate) Lying  Pulse Rate 65  Pulse Rate Source Monitor  ECG Heart Rate 66  Resp 15  Oxygen Therapy  SpO2 96 %  O2 Device Room Air  Patient Activity (if Appropriate) In bed  Pulse Oximetry Type Continuous  Time-Out for Dialysis  What Procedure? Hemodialysis  Pt Identifiers(min of two) First/Last Name;MRN/Account#  Correct Site? Yes  Correct Side? Yes  Correct Procedure? Yes  Consents Verified? Yes  Rad Studies Available? N/A  Safety Precautions Reviewed? Yes  Machine Checks  Machine Number 9 (AR TABLO 9)  Bay/Room Number 16 [phone])  Alarm Test Passed  Chlorine/Chloramine Test Passed  Conductivity: Machine  13.8  Dialyzer Lot Number [id number]  Disposable Set Lot Number [id number]  Dialysate Acid Bath Lot Number [id number]  Dialysate HCO3 Bath Lot Number [id number]  Machine Temperature 98.6 F (37 C)  Pre Treatment  Is pt a NEW START this admission?  No  What is patient's outpatient schedule? TTS  Vascular access used during treatment Fistula  Patient is receiving dialysis in a chair No  Hemodialysis Consent Verified Yes  Hemodialysis Standing Orders Initiated Yes  ECG (Telemetry) Monitor On Yes  Prime Ordered Normal Saline  Length of  DialysisTreatment -hour(s) 3.5 Hour(s)  Dialysis mode HD  Dialysis Treatment Comments Patient instruction given about treatment plan for this hemodialysis with assist from Spanish interpreter Lars Mage - ID # [phone]); patient verbalized understanding.  Dialyzer Revaclear 400  Dialysate 2K;2.5 Ca;Other (Comment) (K+ Per Protocol; Potassium level = 5.5 mmol/L)  Dialysis Anticoagulation Automated NS Flushes  Dialysate Flow Ordered 300  Blood Flow Rate Ordered 400 mL/min  Ultrafiltration Goal 2 Liters  Pre Treatment Labs Hep B Surf Ag;Hep B Core Total Ab  Dialysis Blood  Pressure Support Ordered Normal Saline  Hepatitis B Pre Treatment Patient Checks  Hepatitis B Surface Antigen Results Pending (labs drawn, awaiting results)  Date Hepatitis B Surface Antigen Drawn 08/28/23  Hep B Antibody Quant/Post Pending  Date Hep B Antibody Quant/Post Drawn 08/28/23  Patient's Immunity Status Pending (labs drawn, awaiting results)  Isolation Initiated No  Note  Patient Observations Patient alert, no c/o voiced, no acute distress noted; condition stable for this treatment initiation.

## 2023-08-28 NOTE — ED Notes (Signed)
Dialysis completed

## 2023-08-28 NOTE — Assessment & Plan Note (Signed)
Echo on 02/11/2021 showed EF of 55 to 60% with grade 2 diastolic dysfunction.  Patient does not have leg edema JVD.  CHF seem to be compensated. -Volume management per renal by dialysis

## 2023-08-28 NOTE — Inpatient Diabetes Management (Signed)
Inpatient Diabetes Program Recommendations  AACE/ADA: New Consensus Statement on Inpatient Glycemic Control   Target Ranges:  Prepandial:   less than 140 mg/dL      Peak postprandial:   less than 180 mg/dL (1-2 hours)      Critically ill patients:  140 - 180 mg/dL    Latest Reference Range & Units 08/28/23 08:20  Glucose-Capillary 70 - 99 mg/dL 90    Latest Reference Range & Units 08/27/23 18:44 08/27/23 18:58 08/27/23 22:37 08/27/23 23:16 08/28/23 08:20  Glucose-Capillary 70 - 99 mg/dL 161 (H) 096 (H) 44 (LL) 171 (H) 90   Review of Glycemic Control  Diabetes history: DM2 Outpatient Diabetes medications: Novolog 70/30 5 units BID Current orders for Inpatient glycemic control: Novolog 70/30 4 units BID, Novolog 0-9 units TID with meals, Novolog 0-5 units QHS  Inpatient Diabetes Program Recommendations:    Insulin: Noted patient received D50 and Novolog 8 units at 20:58 on 9/18 and CBG down to 44 mg/dl at 04:54. Fasting CBG 90 mg/dl this morning.  May want to discontinue 70/30 at this time and consider adding it back if CBGs become consistently over 180 mg/dl with Novolog correction insulin.  Thanks, Orlando Penner, RN, MSN, CDCES Diabetes Coordinator Inpatient Diabetes Program [phone] (Team Pager from 8am to 5pm)

## 2023-08-28 NOTE — Progress Notes (Signed)
OT Cancellation Note  Patient Details Name: Anita Contreras MRN: [id number] DOB: 06-23-1956   Cancelled Treatment:    Reason Eval/Treat Not Completed: Medical issues which prohibited therapy (Consult recieved, chart reviewed. Pt with hyperkalemia and EKG changes. Elevated K+ after dialysis. Will hold at this time per secure chat with attending pending rounds and clearance with nephrology).   Tameeka Luo L. Omarian Jaquith, OTR/L  08/28/23, 3:17 PM

## 2023-08-28 NOTE — ED Notes (Signed)
Pt receiving dialysis at this time.

## 2023-08-28 NOTE — Hospital Course (Signed)
HPI on admission 08/27/23:  "Anita Contreras is a 67 y.o. female with medical history significant of ESRD-HD (TTS), HTN, ICH, HTN, HLD, DM, dCHF, migraine, who presents with numbness and weakness.   Per her daughter at the bedside, patient's symptoms started at about 3:00 PM.  She complains of numbness in bilateral face, fingers and toes.  She reports weakness in both legs and both arms.  No vision loss or hearing loss.  Denies chest pain, cough, shortness of breath.  No fever or chills.  Patient has nausea and vomited once, no diarrhea.  Patient had some abdominal discomfort earlier, which has resolved.  Currently no abdominal pain.  No symptoms of UTI.  She had dialysis yesterday. Pt was found to have elevated blood pressure 201/63 in ED..."  In the ED -- labs showed potassium 6.3 and T wave peaking on EKG, BUN 66, creatinine 6.44, vitals stable except severely elevated BP.  CT of head negative.  MRI of the brain negative for stroke.   Pt admitted for further evaluation and management of hypertensive emergency/urgency.  Further hospital course and management as outlined below.

## 2023-08-28 NOTE — ED Notes (Signed)
Lab called to collect blood work.

## 2023-08-28 NOTE — Progress Notes (Signed)
Received patient at bedside in ED.  Alert and oriented.  Informed consent signed and in chart.   TX duration: 3.5 Hours  Patient tolerated well; decreased BP and mild cramping of BLE resolved with interventions.  Transported back to the room  Alert, without acute distress.  Hand-off given to patient's nurse.   Access used: RUE AVF Access issues: None  Total UF removed: 1400 mL Medication(s) given: None Post HD VS: please see Data Insert    08/28/23 0545  Vitals  Temp 98.6 F (37 C)  BP (!) 146/54  MAP (mmHg) 80  BP Location Left Arm  BP Method Automatic  Patient Position (if appropriate) Lying  Pulse Rate 72  Pulse Rate Source Monitor  ECG Heart Rate 72  Resp 15  Oxygen Therapy  SpO2 98 %  O2 Device Room Air  Patient Activity (if Appropriate) In bed  Pulse Oximetry Type Continuous  During Treatment Monitoring  Blood Flow Rate (mL/min) 0 mL/min  Arterial Pressure (mmHg) -1.82 mmHg  Venous Pressure (mmHg) -0.4 mmHg  TMP (mmHg) 14.14 mmHg  Ultrafiltration Rate (mL/min) 788 mL/min  Dialysate Flow Rate (mL/min) 299 ml/min  Duration of HD Treatment -hour(s) 3.5 hour(s)  Cumulative Fluid Removed (mL) per Treatment  1400.12  Post Treatment  Dialyzer Clearance Lightly streaked  Hemodialysis Intake (mL) 200 mL  Liters Processed 84  Fluid Removed (mL) 1400 mL  Tolerated HD Treatment Yes  Post-Hemodialysis Comments Treatment complete and blood returned.  Note  Patient Observations Patient alert, no c/o voiced, no acute distress noted; conditon stable upon this treatment d/c.  Fistula / Graft Right Upper arm Arteriovenous fistula  Placement Date: 11/13/16   Placed prior to admission: Yes  Orientation: Right  Access Location: Upper arm  Access Type: Arteriovenous fistula  Site Condition No complications  Fistula / Graft Assessment Present;Thrill;Bruit  Status Flushed;Patent;Deaccessed  Drainage Description None      [facility]

## 2023-08-28 NOTE — Progress Notes (Signed)
Assessment & care completed w/ phone interpretor

## 2023-08-28 NOTE — Progress Notes (Signed)
PT Cancellation Note  Patient Details Name: Anita Contreras MRN: [id number] DOB: 1956/08/25   Cancelled Treatment:    Reason Eval/Treat Not Completed: Medical issues which prohibited therapy (Consult received and chart reviewed.  Patient noted with hyperkalemia and EKG changes on arrival.  Now s/p dialysis, with still with elevated K+. Per discussion with attending, will hold at this time pending rounds and additional clearance per nephrology)  Tommy Rainwater. Manson Passey, PT, DPT, NCS 08/28/23, 2:21 PM [phone]

## 2023-08-28 NOTE — Progress Notes (Signed)
[facility]  ROUNDING NOTE   Subjective:   Anita Contreras  is a 67 y.o. spanish speaking female with past medical history of hypertension, dyslipidemia, type 2 diabetes with diabetic retinopathy, CVA, peripheral neuropathy, and end-stage renal disease on hemodialysis.  Patient is known to [place] from previous admissions. She presents for generalized numbness and left arm drift. She has been admitted under observation for Facial droop [R29.810] Facial numbness [R20.0]  Patient is known to our practice from previous admissions and receives outpatient dialysis at [facility] on a TTS schedule, supervised by [facility] physicians. She received a full treatment on Tuesday. She states she began feeling unwell Wednesday afternoon. Vomiting noted. Concern for stroke like symptoms. Denies weakness.  Labs on ED arrival significant for potassium 6.3, BUN 66, creatinine 6.44 with GFR 7. Brain MRI negative for acute findings. Blood pressure elevated on admission, 232/71.   We have been consulted to provide urgent dialysis.    Objective:  Vital signs in last 24 hours:  Temp:  [98 F (36.7 C)-98.6 F (37 C)] 98.6 F (37 C) (09/19 0545) Pulse Rate:  [65-75] 71 (09/19 1149) Resp:  [11-24] 16 (09/19 1149) BP: (83-232)/(44-87) 181/74 (09/19 1149) SpO2:  [96 %-100 %] 99 % (09/19 1149)  Weight change:  There were no vitals filed for this visit.  Intake/Output: I/O last 3 completed shifts: In: 50 [IV Piggyback:50] Out: 1400 [Other:1400]   Intake/Output this shift:  No intake/output data recorded.  Physical Exam: General: NAD  Head: Normocephalic, atraumatic. Moist oral mucosal membranes  Eyes: Anicteric  Neck: Supple, trachea midline  Lungs:  Clear to auscultation, normal effort  Heart: Regular rate and rhythm  Abdomen:  Soft, nontender  Extremities:  No peripheral edema.  Neurologic: Nonfocal, moving all four extremities  Skin: No lesions  Access: Rt AVF    Basic Metabolic  Panel: Recent Labs  Lab 08/27/23 1952 08/27/23 2214 08/28/23 0152  NA 139 138 137  K 6.3* 5.5* 5.7*  CL 98 101 100  CO2 28 27 27   GLUCOSE 128* 70 65*  BUN 66* 68* 72*  CREATININE 6.44* 6.87* 7.08*  CALCIUM 9.4 9.0 8.4*  PHOS  --  2.9  --     Liver Function Tests: Recent Labs  Lab 08/27/23 1952 08/27/23 2214  AST 30  --   ALT 14  --   ALKPHOS 183*  --   BILITOT 0.9  --   PROT 7.9  --   ALBUMIN 4.8 3.5   No results for input(s): "LIPASE", "AMYLASE" in the last 168 hours. No results for input(s): "AMMONIA" in the last 168 hours.  CBC: Recent Labs  Lab 08/27/23 1952 08/27/23 2214  WBC 5.9 4.9  NEUTROABS 4.2  --   HGB 13.1 11.0*  HCT 39.2 34.1*  MCV 96.8 99.7  PLT 147* 126*    Cardiac Enzymes: No results for input(s): "CKTOTAL", "CKMB", "CKMBINDEX", "TROPONINI" in the last 168 hours.  BNP: Invalid input(s): "POCBNP"  CBG: Recent Labs  Lab 08/27/23 1858 08/27/23 2237 08/27/23 2316 08/28/23 0820 08/28/23 1137  GLUCAP 147* 44* 171* 90 259*    Microbiology: Results for orders placed or performed during the hospital encounter of 08/27/21  Resp Panel by RT-PCR (Flu A&B, Covid) Nasopharyngeal Swab     Status: None   Collection Time: 08/27/21  9:11 PM   Specimen: Nasopharyngeal Swab; Nasopharyngeal(NP) swabs in vial transport medium  Result Value Ref Range Status   SARS Coronavirus 2 by RT PCR NEGATIVE NEGATIVE Final  Comment: (NOTE) SARS-CoV-2 target nucleic acids are NOT DETECTED.  The SARS-CoV-2 RNA is generally detectable in upper respiratory specimens during the acute phase of infection. The lowest concentration of SARS-CoV-2 viral copies this assay can detect is 138 copies/mL. A negative result does not preclude SARS-Cov-2 infection and should not be used as the sole basis for treatment or other patient management decisions. A negative result may occur with  improper specimen collection/handling, submission of specimen other than  nasopharyngeal swab, presence of viral mutation(s) within the areas targeted by this assay, and inadequate number of viral copies(<138 copies/mL). A negative result must be combined with clinical observations, patient history, and epidemiological information. The expected result is Negative.  Fact Sheet for Patients:  BloggerCourse.com  Fact Sheet for Healthcare Providers:  SeriousBroker.it  This test is no t yet approved or cleared by the [place] FDA and  has been authorized for detection and/or diagnosis of SARS-CoV-2 by FDA under an Emergency Use Authorization (EUA). This EUA will remain  in effect (meaning this test can be used) for the duration of the COVID-19 declaration under Section 564(b)(1) of the Act, 21 U.S.C.section 360bbb-3(b)(1), unless the authorization is terminated  or revoked sooner.       Influenza A by PCR NEGATIVE NEGATIVE Final   Influenza B by PCR NEGATIVE NEGATIVE Final    Comment: (NOTE) The Xpert Xpress SARS-CoV-2/FLU/RSV plus assay is intended as an aid in the diagnosis of influenza from Nasopharyngeal swab specimens and should not be used as a sole basis for treatment. Nasal washings and aspirates are unacceptable for Xpert Xpress SARS-CoV-2/FLU/RSV testing.  Fact Sheet for Patients: BloggerCourse.com  Fact Sheet for Healthcare Providers: SeriousBroker.it  This test is not yet approved or cleared by the [place] FDA and has been authorized for detection and/or diagnosis of SARS-CoV-2 by FDA under an Emergency Use Authorization (EUA). This EUA will remain in effect (meaning this test can be used) for the duration of the COVID-19 declaration under Section 564(b)(1) of the Act, 21 U.S.C. section 360bbb-3(b)(1), unless the authorization is terminated or revoked.  Performed at [facility], [address] [place], [place]  [address]   MRSA Next Gen by PCR, Nasal     Status: None   Collection Time: 08/28/21  1:08 AM   Specimen: Nasal Mucosa; Nasal Swab  Result Value Ref Range Status   MRSA by PCR Next Gen NOT DETECTED NOT DETECTED Final    Comment: (NOTE) The GeneXpert MRSA Assay (FDA approved for NASAL specimens only), is one component of a comprehensive MRSA colonization surveillance program. It is not intended to diagnose MRSA infection nor to guide or monitor treatment for MRSA infections. Test performance is not FDA approved in patients less than 74 years old. Performed at [facility], [address] [place], [place] [address]   CULTURE, BLOOD (ROUTINE X 2) w Reflex to ID Panel     Status: None   Collection Time: 08/29/21 10:27 AM   Specimen: BLOOD  Result Value Ref Range Status   Specimen Description BLOOD  [facility]  Final   Special Requests   Final    BOTTLES DRAWN AEROBIC AND ANAEROBIC Blood Culture adequate volume   Culture   Final    NO GROWTH 5 DAYS Performed at [facility], [address] [place], [place] [address]    Report Status 09/03/2021 FINAL  Final  CULTURE, BLOOD (ROUTINE X 2) w Reflex to ID Panel     Status: None  Collection Time: 08/29/21 10:27 AM   Specimen: BLOOD  Result Value Ref Range Status   Specimen Description BLOOD  LAC  Final   Special Requests   Final    BOTTLES DRAWN AEROBIC AND ANAEROBIC Blood Culture adequate volume   Culture   Final    NO GROWTH 5 DAYS Performed at [facility], [address], [place], [place] [address]    Report Status 09/03/2021 FINAL  Final    Coagulation Studies: Recent Labs    08/27/23 1952  LABPROT 12.0  INR 0.9    Urinalysis: No results for input(s): "COLORURINE", "LABSPEC", "PHURINE", "GLUCOSEU", "HGBUR", "BILIRUBINUR", "KETONESUR", "PROTEINUR", "UROBILINOGEN", "NITRITE", "LEUKOCYTESUR" in the last 72 hours.  Invalid input(s): "APPERANCEUR"    Imaging: [name]  Result Date:  08/27/2023 CLINICAL DATA:  Stroke suspected, numbness all over, hypertension EXAM: MRI HEAD WITHOUT CONTRAST TECHNIQUE: Multiplanar, multiecho pulse sequences of the brain and surrounding structures were obtained without intravenous contrast. COMPARISON:  12/11/2022 MRI head correlation is also made with 08/27/2023 CT head FINDINGS: Brain: No restricted diffusion to suggest acute or subacute infarct. No acute hemorrhage, mass, mass effect, or midline shift. No hydrocephalus or extra-axial collection. Normal pituitary and craniocervical junction. Punctate foci of hemosiderin deposition in the bilateral frontal lobes and right cerebellar hemisphere, likely sequela prior hypertensive microhemorrhage. Normal cerebral volume for age. Vascular: Normal arterial flow voids. Skull and upper cervical spine: Normal marrow signal. Sinuses/Orbits: Minimal mucosal thickening in the ethmoid air cells. T2 hyperintense collection along the posterior aspect of the right globe, which does not suppress on FLAIR (series 8, image 9 and series 9, image 16), which was present on the 12/11/2022 exam and correlates with some thickening along the posterior aspect of the globe on CT. Other: The mastoid air cells are well aerated. IMPRESSION: 1. No acute intracranial process. No evidence of acute or subacute infarct. 2. T2 hyperintense collection along the posterior aspect of the right globe, which was present on the 12/11/2022 exam and correlates with some thickening along the posterior aspect of the globe on CT. This could indicate retinal detachment. Correlate with ophthalmologic exam. Electronically Signed   By: Wiliam Ke M.D.   On: 08/27/2023 22:21   CT HEAD CODE STROKE WO CONTRAST  Result Date: 08/27/2023 CLINICAL DATA:  Code stroke.  Neuro deficit, acute, stroke suspected EXAM: CT HEAD WITHOUT CONTRAST TECHNIQUE: Contiguous axial images were obtained from the base of the skull through the vertex without intravenous contrast.  RADIATION DOSE REDUCTION: This exam was performed according to the departmental dose-optimization program which includes automated exposure control, adjustment of the mA and/or kV according to patient size and/or use of iterative reconstruction technique. COMPARISON:  CT head 12/09/2022. FINDINGS: Brain: No evidence of acute large vascular territory infarction, hemorrhage, hydrocephalus, extra-axial collection or mass lesion/mass effect. Vascular: No hyperdense vessel identified. Skull: No acute fracture. Sinuses/Orbits: Clear sinuses.  No acute orbital findings. Other: No mastoid effusions. ASPECTS [facility] Stroke Program Early CT Score) total score (0-10 with 10 being normal): 10. IMPRESSION: 1. No evidence of acute intracranial abnormality. 2. ASPECTS is 10 Code stroke imaging results were communicated on 08/27/2023 at 6:56 pm to provider Dr. Wilford Corner via secure text paging. Electronically Signed   By: Feliberto Harts M.D.   On: 08/27/2023 18:57     Medications:    anticoagulant sodium citrate      amLODipine  10 mg Oral Daily   aspirin EC  81 mg Oral Daily   atorvastatin  40  mg Oral q1800   calcium acetate  667 mg Oral TID WC   Chlorhexidine Gluconate Cloth  6 each Topical Q0600   heparin  5,000 Units Subcutaneous Q8H   hydrALAZINE  100 mg Oral Q8H   insulin aspart  0-9 Units Subcutaneous TID WC   acetaminophen **OR** acetaminophen (TYLENOL) oral liquid 160 mg/5 mL **OR** acetaminophen, alteplase, anticoagulant sodium citrate, feeding supplement (NEPRO CARB STEADY), heparin, hydrALAZINE, hydrOXYzine, lidocaine (PF), lidocaine-prilocaine, LORazepam, ondansetron (ZOFRAN) IV, pentafluoroprop-tetrafluoroeth, senna-docusate  Assessment/ Plan:  Ms. Tuwana Lofthouse is a 67 y.o.  female with past medical history of hypertension, dyslipidemia, type 2 diabetes with diabetic retinopathy, CVA, peripheral neuropathy, and end-stage renal disease on hemodialysis.  Patient is known to [place] from previous  admissions. She presents for   [facility] nephrology/TTS/[place]/right arm AV fistula   Hyperkalemia with end stage renal disease on hemodialysis. Potassium 6.3, received urgent dialysis overnight on 1K bath along with shifting measures. Next treatment scheduled for Saturday. Hold irbesartan due to hyperkalemia.   2. Anemia of chronic kidney disease Lab Results  Component Value Date   HGB 11.0 (L) 08/27/2023    Hgb stable  3. Hypertensive urgency, BP 232/71. Amlodipine and Hydralazine ordered. Continue holding irbesartan.   4. Secondary Hyperparathyroidism:   Lab Results  Component Value Date   PTH 183 (H) 11/09/2016   CALCIUM 8.4 (L) 08/28/2023   PHOS 2.9 08/27/2023    Bone minerals within desired goal.     LOS: 0 Jaylanni Eltringham 9/19/20241:13 PM

## 2023-08-28 NOTE — Assessment & Plan Note (Signed)
Lipitor

## 2023-08-28 NOTE — Plan of Care (Signed)
MRI negative for stroke.  Presentation c/w HTN emergency/urgency Has some retinal findings on MRI head that need ophtho consult. I will be available as needed.  Milon Dikes. MD Neurology

## 2023-08-29 LAB — RENAL FUNCTION PANEL
Albumin: 3.4 g/dL — ABNORMAL LOW (ref 3.5–5.0)
Anion gap: 10 (ref 5–15)
BUN: 41 mg/dL — ABNORMAL HIGH (ref 8–23)
CO2: 28 mmol/L (ref 22–32)
Calcium: 8 mg/dL — ABNORMAL LOW (ref 8.9–10.3)
Chloride: 93 mmol/L — ABNORMAL LOW (ref 98–111)
Creatinine, Ser: 4.9 mg/dL — ABNORMAL HIGH (ref 0.44–1.00)
GFR, Estimated: 9 mL/min — ABNORMAL LOW (ref >60)
Glucose, Bld: 124 mg/dL — ABNORMAL HIGH (ref 70–99)
Phosphorus: 2.6 mg/dL (ref 2.5–4.6)
Potassium: 5.7 mmol/L — ABNORMAL HIGH (ref 3.5–5.1)
Sodium: 131 mmol/L — ABNORMAL LOW (ref 135–145)

## 2023-08-29 LAB — CBC
HCT: 32.2 % — ABNORMAL LOW (ref 36.0–46.0)
Hemoglobin: 11.1 g/dL — ABNORMAL LOW (ref 12.0–15.0)
MCH: 32.1 pg (ref 26.0–34.0)
MCHC: 34.5 g/dL (ref 30.0–36.0)
MCV: 93.1 fL (ref 80.0–100.0)
Platelets: 142 10*3/uL — ABNORMAL LOW (ref 150–400)
RBC: 3.46 MIL/uL — ABNORMAL LOW (ref 3.87–5.11)
RDW: 13.9 % (ref 11.5–15.5)
WBC: 4.8 10*3/uL (ref 4.0–10.5)
nRBC: 0 % (ref 0.0–0.2)

## 2023-08-29 LAB — URINALYSIS, W/ REFLEX TO CULTURE (INFECTION SUSPECTED)
Bilirubin Urine: NEGATIVE
Glucose, UA: NEGATIVE mg/dL
Ketones, ur: NEGATIVE mg/dL
Nitrite: NEGATIVE
Protein, ur: 100 mg/dL — AB
Specific Gravity, Urine: 1.008 (ref 1.005–1.030)
WBC, UA: >50 WBC/hpf (ref 0–5)
pH: 9 — ABNORMAL HIGH (ref 5.0–8.0)

## 2023-08-29 LAB — GLUCOSE, CAPILLARY
Glucose-Capillary: 102 mg/dL — ABNORMAL HIGH (ref 70–99)
Glucose-Capillary: 188 mg/dL — ABNORMAL HIGH (ref 70–99)

## 2023-08-29 LAB — HEPATITIS B SURFACE ANTIBODY, QUANTITATIVE: Hep B S AB Quant (Post): 55.5 m[IU]/mL

## 2023-08-29 MED ORDER — INSULIN NPH ISOPHANE & REGULAR (70-30) 100 UNIT/ML ~~LOC~~ SUSP: 5.0000 [IU] | Freq: Two times a day (BID) | SUBCUTANEOUS | Status: AC

## 2023-08-29 MED ORDER — SODIUM ZIRCONIUM CYCLOSILICATE 10 G PO PACK
10.0000 g | PACK | Freq: Once | ORAL | Status: AC
  Administered 2023-08-29: 10 g via ORAL
  Filled 2023-08-29: qty 1

## 2023-08-29 MED ORDER — RENA-VITE PO TABS: 1.0000 | ORAL_TABLET | Freq: Every day | ORAL | Status: AC

## 2023-08-29 MED ORDER — RENA-VITE PO TABS: 1.0000 | ORAL_TABLET | Freq: Every day | ORAL | Status: DC

## 2023-08-29 MED ORDER — NEPRO/CARBSTEADY PO LIQD
237.0000 mL | Freq: Two times a day (BID) | ORAL | Status: DC
  Administered 2023-08-29 (×2): 237 mL via ORAL

## 2023-08-29 MED ORDER — NEPRO/CARBSTEADY PO LIQD: 237.0000 mL | Freq: Two times a day (BID) | ORAL | Status: AC

## 2023-08-29 MED ORDER — ISOSORBIDE MONONITRATE ER 30 MG PO TB24: 30.0000 mg | ORAL_TABLET | Freq: Every day | ORAL | 2 refills | Status: AC

## 2023-08-29 NOTE — Progress Notes (Signed)
OT Cancellation Note  Patient Details Name: Anita Contreras MRN: [id number] DOB: 06-Mar-1956   Cancelled Treatment:    Reason Eval/Treat Not Completed: OT screened, no needs identified, will sign off (Discussed with evaluating PT. Pt at baseline, has been performing mobility and ADLs IND in room. No acute OT needs at this time, OT to sign off.)  Regan Llorente L. Sharon Rubis, OTR/L  08/29/23, 11:25 AM

## 2023-08-29 NOTE — Progress Notes (Signed)
[facility]  ROUNDING NOTE   Subjective:   Anita Contreras  is a 67 y.o. spanish speaking female with past medical history of hypertension, dyslipidemia, type 2 diabetes with diabetic retinopathy, CVA, peripheral neuropathy, and end-stage renal disease on hemodialysis.  Patient is known to [place] from previous admissions. She presents for generalized numbness and left arm drift. She has been admitted under observation for Hypertensive crisis [I16.9] Hyperkalemia [E87.5] Paresthesia [R20.2] Facial numbness [R20.0] Facial droop [R29.810] Weakness of both lower extremities [R29.898] Altered mental status, unspecified altered mental status type [R41.82] Nausea and vomiting, unspecified vomiting type [R11.2] Hypertensive urgency [I16.0]  Patient is known to our practice from previous admissions and receives outpatient dialysis at [facility] on a TTS schedule, supervised by [facility] physicians.   Patient seen standing by window Denies pain or discomfort  Requesting discharge.    Objective:  Vital signs in last 24 hours:  Temp:  [97.6 F (36.4 C)-98.9 F (37.2 C)] 97.8 F (36.6 C) (09/20 0814) Pulse Rate:  [68-73] 73 (09/20 0814) Resp:  [16-17] 16 (09/20 0814) BP: (129-198)/(46-74) 163/64 (09/20 0814) SpO2:  [97 %-100 %] 100 % (09/20 0814)  Weight change:  There were no vitals filed for this visit.  Intake/Output: I/O last 3 completed shifts: In: 50 [IV Piggyback:50] Out: 1400 [Other:1400]   Intake/Output this shift:  Total I/O In: 240 [P.O.:240] Out: -   Physical Exam: General: NAD  Head: Normocephalic, atraumatic. Moist oral mucosal membranes  Eyes: Anicteric  Lungs:  Clear to auscultation, normal effort  Heart: Regular rate and rhythm  Abdomen:  Soft, nontender  Extremities:  No peripheral edema.  Neurologic: Nonfocal, moving all four extremities  Skin: No lesions  Access: Rt AVF    Basic Metabolic Panel: Recent Labs  Lab 08/27/23 1952  08/27/23 2214 08/28/23 0152 08/28/23 1619 08/29/23 0358  NA 139 138 137 131* 131*  K 6.3* 5.5* 5.7* 5.1 5.7*  CL 98 101 100 90* 93*  CO2 28 27 27 29 28   GLUCOSE 128* 70 65* 140* 124*  BUN 66* 68* 72* 27* 41*  CREATININE 6.44* 6.87* 7.08* 3.76* 4.90*  CALCIUM 9.4 9.0 8.4* 8.6* 8.0*  PHOS  --  2.9  --   --  2.6    Liver Function Tests: Recent Labs  Lab 08/27/23 1952 08/27/23 2214 08/29/23 0358  AST 30  --   --   ALT 14  --   --   ALKPHOS 183*  --   --   BILITOT 0.9  --   --   PROT 7.9  --   --   ALBUMIN 4.8 3.5 3.4*   No results for input(s): "LIPASE", "AMYLASE" in the last 168 hours. No results for input(s): "AMMONIA" in the last 168 hours.  CBC: Recent Labs  Lab 08/27/23 1952 08/27/23 2214 08/29/23 0358  WBC 5.9 4.9 4.8  NEUTROABS 4.2  --   --   HGB 13.1 11.0* 11.1*  HCT 39.2 34.1* 32.2*  MCV 96.8 99.7 93.1  PLT 147* 126* 142*    Cardiac Enzymes: No results for input(s): "CKTOTAL", "CKMB", "CKMBINDEX", "TROPONINI" in the last 168 hours.  BNP: Invalid input(s): "POCBNP"  CBG: Recent Labs  Lab 08/28/23 0820 08/28/23 1137 08/28/23 1639 08/28/23 2149 08/29/23 0815  GLUCAP 90 259* 147* 215* 102*    Microbiology: Results for orders placed or performed during the hospital encounter of 08/27/21  Resp Panel by RT-PCR (Flu A&B, Covid) Nasopharyngeal Swab     Status: None  Collection Time: 08/27/21  9:11 PM   Specimen: Nasopharyngeal Swab; Nasopharyngeal(NP) swabs in vial transport medium  Result Value Ref Range Status   SARS Coronavirus 2 by RT PCR NEGATIVE NEGATIVE Final    Comment: (NOTE) SARS-CoV-2 target nucleic acids are NOT DETECTED.  The SARS-CoV-2 RNA is generally detectable in upper respiratory specimens during the acute phase of infection. The lowest concentration of SARS-CoV-2 viral copies this assay can detect is 138 copies/mL. A negative result does not preclude SARS-Cov-2 infection and should not be used as the sole basis for treatment  or other patient management decisions. A negative result may occur with  improper specimen collection/handling, submission of specimen other than nasopharyngeal swab, presence of viral mutation(s) within the areas targeted by this assay, and inadequate number of viral copies(<138 copies/mL). A negative result must be combined with clinical observations, patient history, and epidemiological information. The expected result is Negative.  Fact Sheet for Patients:  BloggerCourse.com  Fact Sheet for Healthcare Providers:  SeriousBroker.it  This test is no t yet approved or cleared by the [place] FDA and  has been authorized for detection and/or diagnosis of SARS-CoV-2 by FDA under an Emergency Use Authorization (EUA). This EUA will remain  in effect (meaning this test can be used) for the duration of the COVID-19 declaration under Section 564(b)(1) of the Act, 21 U.S.C.section 360bbb-3(b)(1), unless the authorization is terminated  or revoked sooner.       Influenza A by PCR NEGATIVE NEGATIVE Final   Influenza B by PCR NEGATIVE NEGATIVE Final    Comment: (NOTE) The Xpert Xpress SARS-CoV-2/FLU/RSV plus assay is intended as an aid in the diagnosis of influenza from Nasopharyngeal swab specimens and should not be used as a sole basis for treatment. Nasal washings and aspirates are unacceptable for Xpert Xpress SARS-CoV-2/FLU/RSV testing.  Fact Sheet for Patients: BloggerCourse.com  Fact Sheet for Healthcare Providers: SeriousBroker.it  This test is not yet approved or cleared by the [place] FDA and has been authorized for detection and/or diagnosis of SARS-CoV-2 by FDA under an Emergency Use Authorization (EUA). This EUA will remain in effect (meaning this test can be used) for the duration of the COVID-19 declaration under Section 564(b)(1) of the Act, 21 U.S.C. section  360bbb-3(b)(1), unless the authorization is terminated or revoked.  Performed at [facility], [address] [place], [place] [address]   MRSA Next Gen by PCR, Nasal     Status: None   Collection Time: 08/28/21  1:08 AM   Specimen: Nasal Mucosa; Nasal Swab  Result Value Ref Range Status   MRSA by PCR Next Gen NOT DETECTED NOT DETECTED Final    Comment: (NOTE) The GeneXpert MRSA Assay (FDA approved for NASAL specimens only), is one component of a comprehensive MRSA colonization surveillance program. It is not intended to diagnose MRSA infection nor to guide or monitor treatment for MRSA infections. Test performance is not FDA approved in patients less than 56 years old. Performed at [facility], [address] [place], [place] [address]   CULTURE, BLOOD (ROUTINE X 2) w Reflex to ID Panel     Status: None   Collection Time: 08/29/21 10:27 AM   Specimen: BLOOD  Result Value Ref Range Status   Specimen Description BLOOD  [facility]  Final   Special Requests   Final    BOTTLES DRAWN AEROBIC AND ANAEROBIC Blood Culture adequate volume   Culture   Final    NO GROWTH 5 DAYS Performed at [facility]  [facility], [address] [place], [place] [address]    Report Status 09/03/2021 FINAL  Final  CULTURE, BLOOD (ROUTINE X 2) w Reflex to ID Panel     Status: None   Collection Time: 08/29/21 10:27 AM   Specimen: BLOOD  Result Value Ref Range Status   Specimen Description BLOOD  [facility]  Final   Special Requests   Final    BOTTLES DRAWN AEROBIC AND ANAEROBIC Blood Culture adequate volume   Culture   Final    NO GROWTH 5 DAYS Performed at [facility], [address] [place], [place] [address]    Report Status 09/03/2021 FINAL  Final    Coagulation Studies: Recent Labs    08/27/23 1952  LABPROT 12.0  INR 0.9    Urinalysis: Recent Labs    08/28/23 0700  COLORURINE YELLOW*  LABSPEC 1.008  PHURINE 9.0*  GLUCOSEU NEGATIVE  HGBUR SMALL*  BILIRUBINUR  NEGATIVE  KETONESUR NEGATIVE  PROTEINUR 100*  NITRITE NEGATIVE  LEUKOCYTESUR LARGE*      Imaging: [name]  Result Date: 08/27/2023 CLINICAL DATA:  Stroke suspected, numbness all over, hypertension EXAM: MRI HEAD WITHOUT CONTRAST TECHNIQUE: Multiplanar, multiecho pulse sequences of the brain and surrounding structures were obtained without intravenous contrast. COMPARISON:  12/11/2022 MRI head correlation is also made with 08/27/2023 CT head FINDINGS: Brain: No restricted diffusion to suggest acute or subacute infarct. No acute hemorrhage, mass, mass effect, or midline shift. No hydrocephalus or extra-axial collection. Normal pituitary and craniocervical junction. Punctate foci of hemosiderin deposition in the bilateral frontal lobes and right cerebellar hemisphere, likely sequela prior hypertensive microhemorrhage. Normal cerebral volume for age. Vascular: Normal arterial flow voids. Skull and upper cervical spine: Normal marrow signal. Sinuses/Orbits: Minimal mucosal thickening in the ethmoid air cells. T2 hyperintense collection along the posterior aspect of the right globe, which does not suppress on FLAIR (series 8, image 9 and series 9, image 16), which was present on the 12/11/2022 exam and correlates with some thickening along the posterior aspect of the globe on CT. Other: The mastoid air cells are well aerated. IMPRESSION: 1. No acute intracranial process. No evidence of acute or subacute infarct. 2. T2 hyperintense collection along the posterior aspect of the right globe, which was present on the 12/11/2022 exam and correlates with some thickening along the posterior aspect of the globe on CT. This could indicate retinal detachment. Correlate with ophthalmologic exam. Electronically Signed   By: Wiliam Ke M.D.   On: 08/27/2023 22:21   CT HEAD CODE STROKE WO CONTRAST  Result Date: 08/27/2023 CLINICAL DATA:  Code stroke.  Neuro deficit, acute, stroke suspected EXAM: CT HEAD  WITHOUT CONTRAST TECHNIQUE: Contiguous axial images were obtained from the base of the skull through the vertex without intravenous contrast. RADIATION DOSE REDUCTION: This exam was performed according to the departmental dose-optimization program which includes automated exposure control, adjustment of the mA and/or kV according to patient size and/or use of iterative reconstruction technique. COMPARISON:  CT head 12/09/2022. FINDINGS: Brain: No evidence of acute large vascular territory infarction, hemorrhage, hydrocephalus, extra-axial collection or mass lesion/mass effect. Vascular: No hyperdense vessel identified. Skull: No acute fracture. Sinuses/Orbits: Clear sinuses.  No acute orbital findings. Other: No mastoid effusions. ASPECTS [facility] Stroke Program Early CT Score) total score (0-10 with 10 being normal): 10. IMPRESSION: 1. No evidence of acute intracranial abnormality. 2. ASPECTS is 10 Code stroke imaging results were communicated on 08/27/2023 at 6:56 pm to provider Dr. Wilford Corner via secure  text paging. Electronically Signed   By: Feliberto Harts M.D.   On: 08/27/2023 18:57     Medications:    anticoagulant sodium citrate      amLODipine  10 mg Oral Daily   aspirin EC  81 mg Oral Daily   atorvastatin  40 mg Oral q1800   calcium acetate  667 mg Oral TID WC   Chlorhexidine Gluconate Cloth  6 each Topical Q0600   feeding supplement (NEPRO CARB STEADY)  237 mL Oral BID BM   heparin  5,000 Units Subcutaneous Q8H   hydrALAZINE  100 mg Oral Q8H   insulin aspart  0-9 Units Subcutaneous TID WC   multivitamin  1 tablet Oral QHS   acetaminophen **OR** acetaminophen (TYLENOL) oral liquid 160 mg/5 mL **OR** acetaminophen, alteplase, anticoagulant sodium citrate, heparin, hydrALAZINE, hydrOXYzine, lidocaine (PF), lidocaine-prilocaine, LORazepam, ondansetron (ZOFRAN) IV, pentafluoroprop-tetrafluoroeth, senna-docusate  Assessment/ Plan:  Ms. Rakyia Mullenix is a 67 y.o.  female with past  medical history of hypertension, dyslipidemia, type 2 diabetes with diabetic retinopathy, CVA, peripheral neuropathy, and end-stage renal disease on hemodialysis.  Patient is known to [place] from previous admissions. She presents for   [facility] nephrology/TTS/[place]/right arm AV fistula   Hyperkalemia with end stage renal disease on hemodialysis. Potassium 6.3 on admission. Corrected with dialysis. Continue to hold irbesartan due to hyperkalemia. Next treatment scheduled for Saturday.   2. Anemia of chronic kidney disease Lab Results  Component Value Date   HGB 11.1 (L) 08/29/2023    Hgb stable. Mircera received at outpatient clinic.   3. Hypertensive urgency, BP 163/64. Amlodipine and Hydralazine ordered. Irbesartan held due to recurrent hyperkalemia.   4. Secondary Hyperparathyroidism:   Lab Results  Component Value Date   PTH 183 (H) 11/09/2016   CALCIUM 8.0 (L) 08/29/2023   PHOS 2.6 08/29/2023    Calcium and phosphorus within optimal range.     LOS: 1 Charlie Char 9/20/202411:28 AM

## 2023-08-29 NOTE — Progress Notes (Signed)
  Anita Contreras  A and O x 4. VSS. Pt tolerating diet well. No complaints of pain or nausea. IV removed intact, prescriptions given. Pt voiced understanding of discharge instructions with no further questions. Pt discharged via wheelchair with axillary.    Allergies as of 08/29/2023       Reactions   Dextromethorphan-guaifenesin    Other reaction(s): Itching of skin (finding)   Propofol Itching   Pt says " when getting anesthesia by body itches" unsure what type of anesthesia        Medication List     STOP taking these medications    pantoprazole 40 MG tablet Commonly known as: PROTONIX   predniSONE 10 MG tablet Commonly known as: DELTASONE   valsartan 40 MG tablet Commonly known as: DIOVAN       TAKE these medications    acetaminophen 500 MG tablet Commonly known as: TYLENOL Take 1,000 mg by mouth every 6 (six) hours as needed for mild pain or headache.   amLODipine 10 MG tablet Commonly known as: NORVASC Take 1 tablet (10 mg total) by mouth daily.   aspirin EC 81 MG tablet TAKE 1 TABLET BY MOUTH EVERY DAY   atorvastatin 40 MG tablet Commonly known as: LIPITOR Take 40 mg by mouth daily at 6 PM.   calcium acetate 667 MG capsule Commonly known as: PHOSLO Take 1 capsule (667 mg total) by mouth 3 (three) times daily with meals.   feeding supplement (NEPRO CARB STEADY) Liqd Take 237 mLs by mouth 2 (two) times daily between meals.   hydrALAZINE 100 MG tablet Commonly known as: APRESOLINE Take 1 tablet (100 mg total) by mouth every 8 (eight) hours.   hydrOXYzine 50 MG tablet Commonly known as: ATARAX Take 25 mg by mouth 2 (two) times daily as needed for itching.   insulin NPH-regular Human (70-30) 100 UNIT/ML injection Inject 5 Units into the skin 2 (two) times daily with a meal. Do not take if your blood sugar is below 120. What changed: additional instructions   isosorbide mononitrate 30 MG 24 hr tablet Commonly known as: IMDUR Take 1  tablet (30 mg total) by mouth daily.   lidocaine-prilocaine cream Commonly known as: EMLA Apply 1 application  topically as needed.   multivitamin Tabs tablet Take 1 tablet by mouth at bedtime.   promethazine 12.5 MG tablet Commonly known as: PHENERGAN Take 12.5 mg by mouth every 6 (six) hours as needed for nausea or vomiting.        Vitals:   08/29/23 0814 08/29/23 1312  BP: (!) 163/64 (!) 131/54  Pulse: 73 72  Resp: 16 18  Temp: 97.8 F (36.6 C) 97.8 F (36.6 C)  SpO2: 100% 97%    Anita Contreras

## 2023-08-29 NOTE — Discharge Summary (Signed)
Physician Discharge Summary   Patient: Anita Contreras MRN: [id number] DOB: 13-Jun-1956  Admit date:     08/27/2023  Discharge date: {dischdate:26783}  Discharge Physician: Anita Contreras   PCP: Anita Sander, MD   Recommendations at discharge:  {Tip this will not be part of the note when signed- Example include specific recommendations for outpatient follow-up, pending tests to follow-up on. 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 [facility] Course: HPI on admission 08/27/23:  "Anita Contreras is a 67 y.o. female with medical history significant of ESRD-HD (TTS), HTN, ICH, HTN, HLD, DM, dCHF, migraine, who presents with numbness and weakness.   Per her daughter at the bedside, patient's symptoms started at about 3:00 PM.  She complains of numbness in bilateral face, fingers and toes.  She reports weakness in both legs and both arms.  No vision loss or hearing loss.  Denies chest pain, cough, shortness of breath.  No fever or chills.  Patient has nausea and vomited once, no diarrhea.  Patient had some abdominal discomfort earlier, which has resolved.  Currently no abdominal pain.  No symptoms of UTI.  She had dialysis yesterday. Pt was found to have elevated blood pressure 201/63 in ED..."  In the ED -- labs showed potassium 6.3 and T wave peaking on EKG, BUN 66, creatinine 6.44, vitals stable except severely elevated BP.  CT of head negative.  MRI of the brain negative for stroke.   Pt admitted for further evaluation and management of hypertensive  emergency/urgency.  Further hospital course and management as outlined below.   Assessment and Plan: * Hypertensive urgency BP initially 232/71 & presented with numbness and weakness as outlined in H&P.  Stroked ruled out with negative MRI. Presenting symptoms resolved with BP improvement and correction of hyperkalemia with dialysis. 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 Improved with D50. --Hold off 70/30 insulin --Sensitive sliding scale Novolog for now --Appreciate diabetes coordinator input --Hypoglycemia protocol  ESRD on hemodialysis (HCC) Hyperkalemia -resolved (K 6.3 on adm) Nephrology following for dialysis. Dialyzed this 130 this AM. Repeat BMP this afternoon - K 5.1  Chronic diastolic CHF (congestive heart failure) (HCC) Echo on 02/11/2021 showed EF of 55 to 60% with grade 2 diastolic dysfunction.  Patient does not have leg edema JVD.  CHF seem to be compensated. -Volume management per renal by dialysis  Essential hypertension See Hypertensive Urgency  Dyslipidemia -Lipitor   Mild protein-calorie malnutrition (HCC) Body weight 46 kg, BMI 18.5 -Consult nutrition   Abnormal finding on MRI of brain MRI of brain showed T2 hyperintense collection along the posterior aspect of the right globe, which was present on the 12/11/2022 exam and correlates with some thickening along the posterior aspect of the globe on CT. This could indicate retinal detachment  --Ophthalmology follow up  needed      {Tip this will not be part of the note when signed There is no height or weight on file to calculate BMI. ,  Nutrition Documentation    Flowsheet Row ED to  Hosp-Admission (Current) from 08/27/2023 in [facility]  Nutrition Problem Increased nutrient needs  Etiology chronic illness  [ESRD on HD]  Nutrition Goal Patient will meet greater than or equal to 90% of their needs  Interventions MVI, Nepro shake     ,  (Optional):26781}  {(NOTE) Pain control PDMP Statment (Optional):26782} Consultants: *** Procedures performed: ***  Disposition: {Plan; Disposition:26390} Diet recommendation:  Discharge Diet Orders (From admission, onward)     Start     Ordered   08/29/23 0000  Diet - low sodium heart healthy        08/29/23 1126           {Diet_Plan:26776} DISCHARGE MEDICATION: Allergies as of 08/29/2023       Reactions   Dextromethorphan-guaifenesin    Other reaction(s): Itching of skin (finding)   Propofol Itching   Pt says " when getting anesthesia by body itches" unsure what type of anesthesia        Medication List     STOP taking these medications    pantoprazole 40 MG tablet Commonly known as: PROTONIX   predniSONE 10 MG tablet Commonly known as: DELTASONE   valsartan 40 MG tablet Commonly known as: DIOVAN       TAKE these medications    acetaminophen 500 MG tablet Commonly known as: TYLENOL Take 1,000 mg by mouth every 6 (six) hours as needed for mild pain or headache.   amLODipine 10 MG tablet Commonly known as: NORVASC Take 1 tablet (10 mg total) by mouth daily.   aspirin EC 81 MG tablet TAKE 1 TABLET BY MOUTH EVERY DAY   atorvastatin 40 MG tablet Commonly known as: LIPITOR Take 40 mg by mouth daily at 6 PM.   calcium acetate 667 MG capsule Commonly known as: PHOSLO Take 1 capsule (667 mg total) by mouth 3 (three) times daily with meals.   feeding supplement (NEPRO CARB STEADY) Liqd Take 237 mLs by mouth 2 (two) times daily between meals.   hydrALAZINE 100 MG tablet Commonly known as: APRESOLINE Take 1 tablet (100 mg total) by mouth every 8 (eight) hours.    hydrOXYzine 50 MG tablet Commonly known as: ATARAX Take 25 mg by mouth 2 (two) times daily as needed for itching.   insulin NPH-regular Human (70-30) 100 UNIT/ML injection Inject 5 Units into the skin 2 (two) times daily with a meal. Do not take if your blood sugar is below 120. What changed: additional instructions   isosorbide mononitrate 30 MG 24 hr tablet Commonly known as: IMDUR Take 1 tablet (30 mg total) by mouth daily.   lidocaine-prilocaine cream Commonly known as: EMLA Apply 1 application  topically as needed.   multivitamin Tabs tablet Take 1 tablet by mouth at bedtime.   promethazine 12.5 MG tablet Commonly known as: PHENERGAN Take 12.5 mg by mouth every 6 (six) hours as needed for nausea or vomiting.        Discharge Exam: There were no vitals filed for this visit. ***  Condition at discharge: {DC Condition:26389}  The results of significant diagnostics from this hospitalization (including imaging, microbiology, ancillary and laboratory) are listed below for reference.   Imaging Studies: [name]  Result Date: 08/27/2023 CLINICAL DATA:  Stroke suspected, numbness  all over, hypertension EXAM: MRI HEAD WITHOUT CONTRAST TECHNIQUE: Multiplanar, multiecho pulse sequences of the brain and surrounding structures were obtained without intravenous contrast. COMPARISON:  12/11/2022 MRI head correlation is also made with 08/27/2023 CT head FINDINGS: Brain: No restricted diffusion to suggest acute or subacute infarct. No acute hemorrhage, mass, mass effect, or midline shift. No hydrocephalus or extra-axial collection. Normal pituitary and craniocervical junction. Punctate foci of hemosiderin deposition in the bilateral frontal lobes and right cerebellar hemisphere, likely sequela prior hypertensive microhemorrhage. Normal cerebral volume for age. Vascular: Normal arterial flow voids. Skull and upper cervical spine: Normal marrow signal. Sinuses/Orbits: Minimal  mucosal thickening in the ethmoid air cells. T2 hyperintense collection along the posterior aspect of the right globe, which does not suppress on FLAIR (series 8, image 9 and series 9, image 16), which was present on the 12/11/2022 exam and correlates with some thickening along the posterior aspect of the globe on CT. Other: The mastoid air cells are well aerated. IMPRESSION: 1. No acute intracranial process. No evidence of acute or subacute infarct. 2. T2 hyperintense collection along the posterior aspect of the right globe, which was present on the 12/11/2022 exam and correlates with some thickening along the posterior aspect of the globe on CT. This could indicate retinal detachment. Correlate with ophthalmologic exam. Electronically Signed   By: Wiliam Ke M.D.   On: 08/27/2023 22:21   CT HEAD CODE STROKE WO CONTRAST  Result Date: 08/27/2023 CLINICAL DATA:  Code stroke.  Neuro deficit, acute, stroke suspected EXAM: CT HEAD WITHOUT CONTRAST TECHNIQUE: Contiguous axial images were obtained from the base of the skull through the vertex without intravenous contrast. RADIATION DOSE REDUCTION: This exam was performed according to the departmental dose-optimization program which includes automated exposure control, adjustment of the mA and/or kV according to patient size and/or use of iterative reconstruction technique. COMPARISON:  CT head 12/09/2022. FINDINGS: Brain: No evidence of acute large vascular territory infarction, hemorrhage, hydrocephalus, extra-axial collection or mass lesion/mass effect. Vascular: No hyperdense vessel identified. Skull: No acute fracture. Sinuses/Orbits: Clear sinuses.  No acute orbital findings. Other: No mastoid effusions. ASPECTS [facility] Stroke Program Early CT Score) total score (0-10 with 10 being normal): 10. IMPRESSION: 1. No evidence of acute intracranial abnormality. 2. ASPECTS is 10 Code stroke imaging results were communicated on 08/27/2023 at 6:56 pm to provider Dr.  Wilford Corner via secure text paging. Electronically Signed   By: Feliberto Harts M.D.   On: 08/27/2023 18:57    Microbiology: Results for orders placed or performed during the hospital encounter of 08/27/21  Resp Panel by RT-PCR (Flu A&B, Covid) Nasopharyngeal Swab     Status: None   Collection Time: 08/27/21  9:11 PM   Specimen: Nasopharyngeal Swab; Nasopharyngeal(NP) swabs in vial transport medium  Result Value Ref Range Status   SARS Coronavirus 2 by RT PCR NEGATIVE NEGATIVE Final    Comment: (NOTE) SARS-CoV-2 target nucleic acids are NOT DETECTED.  The SARS-CoV-2 RNA is generally detectable in upper respiratory specimens during the acute phase of infection. The lowest concentration of SARS-CoV-2 viral copies this assay can detect is 138 copies/mL. A negative result does not preclude SARS-Cov-2 infection and should not be used as the sole basis for treatment or other patient management decisions. A negative result may occur with  improper specimen collection/handling, submission of specimen other than nasopharyngeal swab, presence of viral mutation(s) within the areas targeted by this assay, and inadequate number of viral copies(<138 copies/mL). A negative result must be combined with  clinical observations, patient history, and epidemiological information. The expected result is Negative.  Fact Sheet for Patients:  BloggerCourse.com  Fact Sheet for Healthcare Providers:  SeriousBroker.it  This test is no t yet approved or cleared by the [place] FDA and  has been authorized for detection and/or diagnosis of SARS-CoV-2 by FDA under an Emergency Use Authorization (EUA). This EUA will remain  in effect (meaning this test can be used) for the duration of the COVID-19 declaration under Section 564(b)(1) of the Act, 21 U.S.C.section 360bbb-3(b)(1), unless the authorization is terminated  or revoked sooner.       Influenza A by PCR  NEGATIVE NEGATIVE Final   Influenza B by PCR NEGATIVE NEGATIVE Final    Comment: (NOTE) The Xpert Xpress SARS-CoV-2/FLU/RSV plus assay is intended as an aid in the diagnosis of influenza from Nasopharyngeal swab specimens and should not be used as a sole basis for treatment. Nasal washings and aspirates are unacceptable for Xpert Xpress SARS-CoV-2/FLU/RSV testing.  Fact Sheet for Patients: BloggerCourse.com  Fact Sheet for Healthcare Providers: SeriousBroker.it  This test is not yet approved or cleared by the [place] FDA and has been authorized for detection and/or diagnosis of SARS-CoV-2 by FDA under an Emergency Use Authorization (EUA). This EUA will remain in effect (meaning this test can be used) for the duration of the COVID-19 declaration under Section 564(b)(1) of the Act, 21 U.S.C. section 360bbb-3(b)(1), unless the authorization is terminated or revoked.  Performed at [facility], [address] [place], [place] [address]   MRSA Next Gen by PCR, Nasal     Status: None   Collection Time: 08/28/21  1:08 AM   Specimen: Nasal Mucosa; Nasal Swab  Result Value Ref Range Status   MRSA by PCR Next Gen NOT DETECTED NOT DETECTED Final    Comment: (NOTE) The GeneXpert MRSA Assay (FDA approved for NASAL specimens only), is one component of a comprehensive MRSA colonization surveillance program. It is not intended to diagnose MRSA infection nor to guide or monitor treatment for MRSA infections. Test performance is not FDA approved in patients less than 88 years old. Performed at [facility], [address] [place], [place] [address]   CULTURE, BLOOD (ROUTINE X 2) w Reflex to ID Panel     Status: None   Collection Time: 08/29/21 10:27 AM   Specimen: BLOOD  Result Value Ref Range Status   Specimen Description BLOOD  [facility]  Final   Special Requests   Final    BOTTLES DRAWN AEROBIC AND ANAEROBIC Blood  Culture adequate volume   Culture   Final    NO GROWTH 5 DAYS Performed at [facility], [address] [place], [place] [address]    Report Status 09/03/2021 FINAL  Final  CULTURE, BLOOD (ROUTINE X 2) w Reflex to ID Panel     Status: None   Collection Time: 08/29/21 10:27 AM   Specimen: BLOOD  Result Value Ref Range Status   Specimen Description BLOOD  [facility]  Final   Special Requests   Final    BOTTLES DRAWN AEROBIC AND ANAEROBIC Blood Culture adequate volume   Culture   Final    NO GROWTH 5 DAYS Performed at [facility], [address] [place], [place] [address]    Report Status 09/03/2021 FINAL  Final    Labs: CBC: Recent Labs  Lab 08/27/23 1952 08/27/23 2214 08/29/23 0358  WBC 5.9 4.9 4.8  NEUTROABS 4.2  --   --  HGB 13.1 11.0* 11.1*  HCT 39.2 34.1* 32.2*  MCV 96.8 99.7 93.1  PLT 147* 126* 142*   Basic Metabolic Panel: Recent Labs  Lab 08/27/23 1952 08/27/23 2214 08/28/23 0152 08/28/23 1619 08/29/23 0358  NA 139 138 137 131* 131*  K 6.3* 5.5* 5.7* 5.1 5.7*  CL 98 101 100 90* 93*  CO2 28 27 27 29 28   GLUCOSE 128* 70 65* 140* 124*  BUN 66* 68* 72* 27* 41*  CREATININE 6.44* 6.87* 7.08* 3.76* 4.90*  CALCIUM 9.4 9.0 8.4* 8.6* 8.0*  PHOS  --  2.9  --   --  2.6   Liver Function Tests: Recent Labs  Lab 08/27/23 1952 08/27/23 2214 08/29/23 0358  AST 30  --   --   ALT 14  --   --   ALKPHOS 183*  --   --   BILITOT 0.9  --   --   PROT 7.9  --   --   ALBUMIN 4.8 3.5 3.4*   CBG: Recent Labs  Lab 08/28/23 0820 08/28/23 1137 08/28/23 1639 08/28/23 2149 08/29/23 0815  GLUCAP 90 259* 147* 215* 102*    Discharge time spent: {LESS THAN/GREATER THAN:26388} 30 minutes.  Signed: Pennie Banter, DO [facility] 08/29/2023

## 2023-08-29 NOTE — Evaluation (Signed)
Physical Therapy Evaluation Patient Details Name: Anita Contreras MRN: [id number] DOB: [birth year] Today's Date: 08/29/2023  History of Present Illness  presented to ER secondary to acute onset of L-sided weakness, numbness; admitted for management of hypertensive urgency.  Imaging negative for acute intracranial process.  Clinical Impression  Patient seated in recliner beginning/end of treatment session; alert, oriented and dressed in personal clothing upon arrival to room.  Reports in-room mobility and ADLs (indep) this AM; feels initial-presenting symptoms have fully resolved.  Denies pain.  Bilat UE/LE strength and ROM grossly symmetrical and WFL; no focal weakness or sensory deficit appreciated.  Able to complete sit/stand, basic transfers and gait (400') without assist device, indep.  Demonstrates reciprocal stepping pattern with good step height/length, good trunk rotation/arm swing, good cadence and overall gait speed (10' walk time, 5-6 seconds). Mild sway with head turns, but self-corrects without difficulty.    HR stable in 70s at rest and with exertion. Appears to be at/near baseline for all functional tasks; no acute PT needs identified.  Will complete orders at this time; patient in agreement.  Please re-consult should needs change.  Spanish interpreter, Abbie Sons, present throughout session to facilitate communication with patient.      If plan is discharge home, recommend the following:     Can travel by private vehicle        Equipment Recommendations    Recommendations for Other Services       Functional Status Assessment Patient has not had a recent decline in their functional status     Precautions / Restrictions Precautions Precautions: None Restrictions Weight Bearing Restrictions: No      Mobility  Bed Mobility               General bed mobility comments: seated in recliner beginning/end of treatment session    Transfers Overall  transfer level: Independent Equipment used: None                    Ambulation/Gait Ambulation/Gait assistance: Independent Gait Distance (Feet): 400 Feet Assistive device: None   Gait velocity: 10' walk time, 5-6 seconds Gait velocity interpretation: 1.31 - 2.62 ft/sec, indicative of limited community ambulator   General Gait Details: reciprocal stepping pattern with good step height/length, good trunk rotation/arm swing, good cadence and overall gait speed. Mild sway with head turns, but self-corrects without difficulty  Stairs            Wheelchair Mobility     Tilt Bed    Modified Rankin (Stroke Patients Only)       Balance Overall balance assessment: Independent                                           Pertinent Vitals/Pain Pain Assessment Pain Assessment: No/denies pain    Home Living Family/patient expects to be discharged to:: Private residence     Type of Home: Mobile home Home Access: Stairs to enter   Entrance Stairs-Number of Steps: 3   Home Layout: One level        Prior Function Prior Level of Function : Independent/Modified Independent             Mobility Comments: Indep for ADLs, household and community mobilization without assist device; rides transportation to/from dialysis       Extremity/Trunk Assessment   Upper Extremity Assessment Upper Extremity Assessment: Overall [facility] for  tasks assessed (grossly at least 4/5 throughout; no focal weakness or sensory deficit appreciated)    Lower Extremity Assessment Lower Extremity Assessment: Overall WFL for tasks assessed (grossly at least 4/5 throughout; no focal weakness or sensory deficit appreciated)       Communication      Cognition Arousal: Alert Behavior During Therapy: WFL for tasks assessed/performed Overall Cognitive Status: Within Functional Limits for tasks assessed                                          General  Comments      Exercises     Assessment/Plan    PT Assessment Patient does not need any further PT services  PT Problem List         PT Treatment Interventions      PT Goals (Current goals can be found in the Care Plan section)  Acute Rehab PT Goals Patient Stated Goal: to return home PT Goal Formulation: All assessment and education complete, DC therapy Time For Goal Achievement: 08/29/23 Potential to Achieve Goals: Good    Frequency       Co-evaluation               AM-PAC PT "6 Clicks" Mobility  Outcome Measure Help needed turning from your back to your side while in a flat bed without using bedrails?: None Help needed moving from lying on your back to sitting on the side of a flat bed without using bedrails?: None Help needed moving to and from a bed to a chair (including a wheelchair)?: None Help needed standing up from a chair using your arms (e.g., wheelchair or bedside chair)?: None Help needed to walk in hospital room?: None Help needed climbing 3-5 steps with a railing? : None 6 Click Score: 24    End of Session   Activity Tolerance: Patient tolerated treatment well Patient left: in chair;with call bell/phone within reach (fall risk 7, alarm not required)   PT Visit Diagnosis: Muscle weakness (generalized) (M62.81)    Time: 1610-9604 PT Time Calculation (min) (ACUTE ONLY): 13 min   Charges:   PT Evaluation $PT Eval Low Complexity: 1 Low   PT General Charges $$ ACUTE PT VISIT: 1 Visit         Plummer Matich H. Manson Passey, PT, DPT, NCS 08/29/23, 10:32 AM [phone]

## 2023-08-29 NOTE — Discharge Planning (Signed)
ESTABLISHED HEMODIALYSIS Outpatient Facility  [employer] [address] [place], [place] [address] [phone]  Schedule: TTS 6:00am  Confirmed above schedule with clinic, patient is active and can resume upon discharge.   Dimas Chyle Dialysis Coordinator II  [facility] Cell: [phone] eFax: [phone] [email or web address]

## 2023-08-29 NOTE — Progress Notes (Signed)
Initial Nutrition Assessment  DOCUMENTATION CODES:   Not applicable  INTERVENTION:   -Obtain new wt -Nepro Shake po BID, each supplement provides 425 kcal and 19 grams protein  -Liberalize diet to 2 gram sodium for wider variety of meal selections -Renal MVI daily  NUTRITION DIAGNOSIS:   Increased nutrient needs related to chronic illness (ESRD on HD) as evidenced by estimated needs.  GOAL:   Patient will meet greater than or equal to 90% of their needs  MONITOR:   PO intake, Supplement acceptance  REASON FOR ASSESSMENT:   Consult Assessment of nutrition requirement/status  ASSESSMENT:   Pt with medical history significant of ESRD-HD (TTS), HTN, ICH, HTN, HLD, DM, dCHF, migraine, who presents with numbness and weakness.  Pt admitted with hypertensive urgency.    Pt unavailable at time of visit. Attempted to speak with pt via call to hospital room phone, however, unable to reach. RD unable to obtain further nutrition-related history or complete nutrition-focused physical exam at this time.    Per neurology notes, MRI negative for stroke. Plan to treat for hypertensive urgency.   Pt receives HD as an outpatient on a Tuesday, Thursday, Saturday schedule. Per [facility] nephrology notes, EDW 46 kg. No updated wt available to assess wt changes.   Pt currently on a heart healthy, carb modified diet with 1.2 L fluid restriction. No meal completion data available to assess at this time.   Medications reviewed and include phoslo.  Lab Results  Component Value Date   HGBA1C 5.2 08/27/2023   PTA DM medications are 5 units insulin NPH- regular human BID.   Labs reviewed: Na: 131, K: 5.7, Mg: 2.9, CBGS: 90-259 (inpatient orders for glycemic control are 0-9 units insulin aspart TID with meals).    Diet Order:   Diet Order             Diet heart healthy/carb modified Fluid consistency: Thin; Fluid restriction: 1200 mL Fluid  Diet effective now                    EDUCATION NEEDS:   Not appropriate for education at this time  Skin:  Skin Assessment: Reviewed RN Assessment  Last BM:  08/28/23  Height:   Ht Readings from Last 1 Encounters:  12/09/22 5\' 2"  (1.575 m)    Weight:   Wt Readings from Last 1 Encounters:  12/10/22 46 kg    Ideal Body Weight:  50 kg  BMI:  There is no height or weight on file to calculate BMI.  Estimated Nutritional Needs:   Kcal:  1600-1800  Protein:  80-95 grams  Fluid:  1000 ml +UOP    Levada Schilling, RD, LDN, CDCES Registered Dietitian II Certified Diabetes Care and Education Specialist Please refer to [facility] for RD and/or RD on-call/weekend/after hours pager

## 2023-08-29 NOTE — Plan of Care (Signed)
  Problem: Health Behavior/Discharge Planning: Goal: Ability to identify and utilize available resources and services will improve Outcome: Progressing Goal: Ability to manage health-related needs will improve Outcome: Progressing   Problem: Education: Goal: Knowledge of General Education information will improve Description: Including pain rating scale, medication(s)/side effects and non-pharmacologic comfort measures Outcome: Progressing   Problem: Health Behavior/Discharge Planning: Goal: Ability to manage health-related needs will improve Outcome: Progressing   Problem: Clinical Measurements: Goal: Ability to maintain clinical measurements within normal limits will improve Outcome: Progressing

## 2023-08-31 LAB — URINE CULTURE

## 2023-12-16 ENCOUNTER — Emergency Department: Payer: Self-pay

## 2023-12-16 ENCOUNTER — Emergency Department
Admission: EM | Admit: 2023-12-16 | Discharge: 2023-12-16 | Disposition: A | Discharge status: Home / Self Care | Payer: Self-pay | Attending: Emergency Medicine | Admitting: Emergency Medicine

## 2023-12-16 ENCOUNTER — Other Ambulatory Visit: Payer: Self-pay

## 2023-12-16 DIAGNOSIS — H539 Unspecified visual disturbance: Secondary | ICD-10-CM

## 2023-12-16 DIAGNOSIS — N186 End stage renal disease: Secondary | ICD-10-CM | POA: Insufficient documentation

## 2023-12-16 DIAGNOSIS — R519 Headache, unspecified: Secondary | ICD-10-CM | POA: Insufficient documentation

## 2023-12-16 DIAGNOSIS — H538 Other visual disturbances: Secondary | ICD-10-CM | POA: Insufficient documentation

## 2023-12-16 NOTE — Discharge Instructions (Addendum)
 Go directly to the eye center for evaluation of your vision change/loss - Dr.Brasington was consulted and should be able to see you today.  If for some reason something happens, go to the eye center first thing in the morning.  Take your blood pressure medications as prescribed.  [facility]: Address: [address] [place], [place] [address]

## 2023-12-16 NOTE — ED Triage Notes (Signed)
 Pt c/o right eye redness and h/a x4 days. Pt has hx or stroke. Pt also has dialysis today with hx of CVA and unable to see out of her left eye

## 2023-12-16 NOTE — ED Notes (Signed)
 Per Dr. Penne Lash pt will leave after CT for eye appointment.

## 2023-12-16 NOTE — ED Provider Notes (Signed)
 [facility] Provider Note    Event Date/Time   First MD Initiated Contact with Patient 12/16/23 1526     (approximate)   History   Eye Problem   HPI  Anita Contreras is a 68 y.o. female  here with R eye diminished vision. Pt reports that after her HD session on Sunday, she experienced acute onset of headache and R eye pain. She woke up Sunday and pain was resolved but she noticed diminished vision since then. She went to HD today and was sent in because her eye was bloody/red. Reports no headache currently. No associated facial numbness, vision changes, slurred speech, or other issues. No other complaints. No recent trauma. No blood thinner use.      Physical Exam   Triage Vital Signs: ED Triage Vitals  Encounter Vitals Group     BP 12/16/23 1241 (!) 162/51     Systolic BP Percentile --      Diastolic BP Percentile --      Pulse Rate 12/16/23 1241 67     Resp 12/16/23 1241 16     Temp 12/16/23 1241 98.2 F (36.8 C)     Temp Source 12/16/23 1241 Oral     SpO2 12/16/23 1241 99 %     Weight --      Height --      Head Circumference --      Peak Flow --      Pain Score 12/16/23 1242 0     Pain Loc --      Pain Education --      Exclude from Growth Chart --     Most recent vital signs: Vitals:   12/16/23 1241  BP: (!) 162/51  Pulse: 67  Resp: 16  Temp: 98.2 F (36.8 C)  SpO2: 99%     General: Awake, no distress.  CV:  Good peripheral perfusion.  Resp:  Normal work of breathing.  Abd:  No distention.  Other:  Right sided temporal subconj hemorrhage extending to the iris. Pupil slugglishly reactive. CNII-XII intact, face is symmetric. Normal sensation to light touch. Strength 5/5 bl UE and LE. Normal sensation to light touch. Vision intact to fingers only on R, no vision on L. IOP 16/15.   ED Results / Procedures / Treatments   Labs (all labs ordered are listed, but only abnormal results are displayed) Labs Reviewed - No  data to display   EKG    RADIOLOGY CT Head: On my prelim, no acute hemorrhage   I also independently reviewed and agree with radiologist interpretations.   PROCEDURES:  Critical Care performed: No   MEDICATIONS ORDERED IN ED: Medications - No data to display   IMPRESSION / MDM / ASSESSMENT AND PLAN / ED COURSE  I reviewed the triage vital signs and the nursing notes.                              Differential diagnosis includes, but is not limited to, retinal hemorrhage, vitreous hemorrhage, RD, HTN retinopathy, CVA, ICH, IC mass/lesion  Patient's presentation is most consistent with acute presentation with potential threat to life or bodily function.  The patient is on the cardiac monitor to evaluate for evidence of arrhythmia and/or significant heart rate changes  68 year old female with history of end-stage renal disease, prior hemorrhage of the left eye here with right sided visual changes.  The patient has significantly diminished vision.  This is her only good eye.  She has no focal neurological deficits.  Symptoms began on Saturday night.  CT scan obtained quickly, reviewed by me, shows no evidence of acute intracranial normality.  Symptoms are more consistent with a primary intraocular issue.  IOP is normal.  Discussed the case with ophthalmology.  They will see her emergently in clinic.  Will CVA is on the differential, this is significantly less likely and feel the likelihood of retinal hemorrhage or vitreous hemorrhage is significantly more likely and the need for emergent Optho consultation which is now available is more critical.  Patient will follow-up as an outpatient.  Return precautions given.   FINAL CLINICAL IMPRESSION(S) / ED DIAGNOSES   Final diagnoses:  Vision changes     Rx / DC Orders   ED Discharge Orders     None        Note:  This document was prepared using Dragon voice recognition software and may include unintentional dictation  errors.   Angelena Smalls, MD 12/16/23 [phone]

## 2023-12-16 NOTE — ED Provider Triage Note (Signed)
 Emergency Medicine Provider Triage Evaluation Note  Anita Contreras , a 68 y.o. female  was evaluated in triage.  Pt complains of redness to the right eye since Sunday. She denies eye pain or change in vision. No noted facial droop or weakness in extremities. She went to dialysis today and was told to come to the ER to make sure she didn't have a stroke.  Physical Exam  LMP 09/19/2001 (Approximate)  Gen:   Awake, no distress   Resp:  Normal effort  MSK:   Moves extremities without difficulty  Other:  Neuro exam unremarkable.   Medical Decision Making  Medically screening exam initiated at 12:37 PM.  Appropriate orders placed.  Anita Contreras was informed that the remainder of the evaluation will be completed by another provider, this initial triage assessment does not replace that evaluation, and the importance of remaining in the ED until their evaluation is complete.  Subconjunctival hemorrhage of right eye noted.  No weakness on exam. Patient nor family noted any weakness when symptoms started Saturday or Sunday. CVA workup not initiated.   Anita Kirk NOVAK, FNP 12/16/23 1300

## 2024-03-06 ENCOUNTER — Observation Stay
Admission: EM | Admit: 2024-03-06 | Discharge: 2024-03-07 | Disposition: A | Discharge status: Home / Self Care | Payer: Self-pay | Attending: Internal Medicine | Admitting: Internal Medicine

## 2024-03-06 ENCOUNTER — Emergency Department: Payer: Self-pay

## 2024-03-06 DIAGNOSIS — E1140 Type 2 diabetes mellitus with diabetic neuropathy, unspecified: Secondary | ICD-10-CM | POA: Insufficient documentation

## 2024-03-06 DIAGNOSIS — Z794 Long term (current) use of insulin: Secondary | ICD-10-CM | POA: Insufficient documentation

## 2024-03-06 DIAGNOSIS — Z7982 Long term (current) use of aspirin: Secondary | ICD-10-CM | POA: Insufficient documentation

## 2024-03-06 DIAGNOSIS — Z992 Dependence on renal dialysis: Secondary | ICD-10-CM | POA: Insufficient documentation

## 2024-03-06 DIAGNOSIS — E11319 Type 2 diabetes mellitus with unspecified diabetic retinopathy without macular edema: Secondary | ICD-10-CM | POA: Diagnosis present

## 2024-03-06 DIAGNOSIS — Z8673 Personal history of transient ischemic attack (TIA), and cerebral infarction without residual deficits: Secondary | ICD-10-CM | POA: Insufficient documentation

## 2024-03-06 DIAGNOSIS — R072 Precordial pain: Principal | ICD-10-CM | POA: Diagnosis present

## 2024-03-06 DIAGNOSIS — I5032 Chronic diastolic (congestive) heart failure: Secondary | ICD-10-CM | POA: Diagnosis present

## 2024-03-06 DIAGNOSIS — E1122 Type 2 diabetes mellitus with diabetic chronic kidney disease: Secondary | ICD-10-CM | POA: Insufficient documentation

## 2024-03-06 DIAGNOSIS — I160 Hypertensive urgency: Secondary | ICD-10-CM | POA: Diagnosis present

## 2024-03-06 DIAGNOSIS — R9431 Abnormal electrocardiogram [ECG] [EKG]: Secondary | ICD-10-CM | POA: Insufficient documentation

## 2024-03-06 DIAGNOSIS — Z79899 Other long term (current) drug therapy: Secondary | ICD-10-CM | POA: Insufficient documentation

## 2024-03-06 DIAGNOSIS — I132 Hypertensive heart and chronic kidney disease with heart failure and with stage 5 chronic kidney disease, or end stage renal disease: Secondary | ICD-10-CM | POA: Insufficient documentation

## 2024-03-06 DIAGNOSIS — N186 End stage renal disease: Secondary | ICD-10-CM

## 2024-03-06 DIAGNOSIS — E875 Hyperkalemia: Secondary | ICD-10-CM | POA: Diagnosis present

## 2024-03-06 DIAGNOSIS — D631 Anemia in chronic kidney disease: Secondary | ICD-10-CM | POA: Diagnosis present

## 2024-03-06 DIAGNOSIS — R079 Chest pain, unspecified: Principal | ICD-10-CM
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
## 2024-03-06 NOTE — ED Provider Notes (Signed)
   [facility] Provider Note    None    (approximate)   History   No chief complaint on file.   HPI  Anita Contreras is a 68 y.o. female   Past medical history of ***    Independent Historian contributed to assessment above: ***  External Medical Documents Reviewed: ***      Physical Exam   Triage Vital Signs: ED Triage Vitals  Encounter Vitals Group     BP      Systolic BP Percentile      Diastolic BP Percentile      Pulse      Resp      Temp      Temp src      SpO2      Weight      Height      Head Circumference      Peak Flow      Pain Score      Pain Loc      Pain Education      Exclude from Growth Chart     Most recent vital signs: There were no vitals filed for this visit.  General: Awake, no distress. *** CV:  Good peripheral perfusion. *** Resp:  Normal effort. *** Abd:  No distention. *** Other:  ***   ED Results / Procedures / Treatments   Labs (all labs ordered are listed, but only abnormal results are displayed) Labs Reviewed - No data to display   I ordered and reviewed the above labs they are notable for ***  EKG  ED ECG REPORT I, Pilar Jarvis, the attending physician, personally viewed and interpreted this ECG.   Date: 03/06/2024  EKG Time: ***  Rate: ***  Rhythm: {ekg findings:315101}  Axis: ***  Intervals:{conduction defects:17367}  ST&T Change: ***    RADIOLOGY I independently reviewed and interpreted *** I also reviewed radiologist's formal read.   PROCEDURES:  Critical Care performed: {CriticalCareYesNo:19197::"Yes, see critical care procedure note(s)","No"}  Procedures   MEDICATIONS ORDERED IN ED: Medications - No data to display  External physician / consultants:  I spoke with *** regarding care plan for this patient.   IMPRESSION / MDM / ASSESSMENT AND PLAN / ED COURSE  I reviewed the triage vital signs and the nursing notes.                                 Patient's presentation is most consistent with {EM COPA:27473}  Differential diagnosis includes, but is not limited to, ***   ***The patient is on the cardiac monitor to evaluate for evidence of arrhythmia and/or significant heart rate changes.  MDM:  ***  I considered hospitalization for admission or observation ***        FINAL CLINICAL IMPRESSION(S) / ED DIAGNOSES   Final diagnoses:  None     Rx / DC Orders   ED Discharge Orders     None        Note:  This document was prepared using Dragon voice recognition software and may include unintentional dictation errors.

## 2024-03-07 ENCOUNTER — Emergency Department: Payer: Self-pay

## 2024-03-07 DIAGNOSIS — D631 Anemia in chronic kidney disease: Secondary | ICD-10-CM

## 2024-03-07 DIAGNOSIS — R072 Precordial pain: Secondary | ICD-10-CM | POA: Diagnosis present

## 2024-03-07 DIAGNOSIS — Z992 Dependence on renal dialysis: Secondary | ICD-10-CM

## 2024-03-07 DIAGNOSIS — N186 End stage renal disease: Secondary | ICD-10-CM

## 2024-03-07 DIAGNOSIS — I160 Hypertensive urgency: Secondary | ICD-10-CM

## 2024-03-07 DIAGNOSIS — E875 Hyperkalemia: Secondary | ICD-10-CM

## 2024-03-07 LAB — BASIC METABOLIC PANEL WITH GFR
Anion gap: 9 (ref 5–15)
BUN: 36 mg/dL — ABNORMAL HIGH (ref 8–23)
CO2: 30 mmol/L (ref 22–32)
Calcium: 9 mg/dL (ref 8.9–10.3)
Chloride: 101 mmol/L (ref 98–111)
Creatinine, Ser: 4.52 mg/dL — ABNORMAL HIGH (ref 0.44–1.00)
GFR, Estimated: 10 mL/min — ABNORMAL LOW (ref >60)
Glucose, Bld: 115 mg/dL — ABNORMAL HIGH (ref 70–99)
Potassium: 4.5 mmol/L (ref 3.5–5.1)
Sodium: 140 mmol/L (ref 135–145)

## 2024-03-07 LAB — CBC WITH DIFFERENTIAL/PLATELET
Abs Immature Granulocytes: 0.09 10*3/uL — ABNORMAL HIGH (ref 0.00–0.07)
Basophils Absolute: 0 10*3/uL (ref 0.0–0.1)
Basophils Relative: 1 %
Eosinophils Absolute: 0.1 10*3/uL (ref 0.0–0.5)
Eosinophils Relative: 1 %
HCT: 34.8 % — ABNORMAL LOW (ref 36.0–46.0)
Hemoglobin: 11.4 g/dL — ABNORMAL LOW (ref 12.0–15.0)
Immature Granulocytes: 1 %
Lymphocytes Relative: 18 %
Lymphs Abs: 1.2 10*3/uL (ref 0.7–4.0)
MCH: 32.9 pg (ref 26.0–34.0)
MCHC: 32.8 g/dL (ref 30.0–36.0)
MCV: 100.3 fL — ABNORMAL HIGH (ref 80.0–100.0)
Monocytes Absolute: 0.6 10*3/uL (ref 0.1–1.0)
Monocytes Relative: 10 %
Neutro Abs: 4.6 10*3/uL (ref 1.7–7.7)
Neutrophils Relative %: 69 %
Platelets: 159 10*3/uL (ref 150–400)
RBC: 3.47 MIL/uL — ABNORMAL LOW (ref 3.87–5.11)
RDW: 14.6 % (ref 11.5–15.5)
WBC: 6.7 10*3/uL (ref 4.0–10.5)
nRBC: 0.9 % — ABNORMAL HIGH (ref 0.0–0.2)

## 2024-03-07 LAB — COMPREHENSIVE METABOLIC PANEL WITH GFR
ALT: 16 U/L (ref 0–44)
AST: 31 U/L (ref 15–41)
Albumin: 3.8 g/dL (ref 3.5–5.0)
Alkaline Phosphatase: 177 U/L — ABNORMAL HIGH (ref 38–126)
Anion gap: 9 (ref 5–15)
BUN: 34 mg/dL — ABNORMAL HIGH (ref 8–23)
CO2: 34 mmol/L — ABNORMAL HIGH (ref 22–32)
Calcium: 10.2 mg/dL (ref 8.9–10.3)
Chloride: 97 mmol/L — ABNORMAL LOW (ref 98–111)
Creatinine, Ser: 4.27 mg/dL — ABNORMAL HIGH (ref 0.44–1.00)
GFR, Estimated: 11 mL/min — ABNORMAL LOW (ref >60)
Glucose, Bld: 112 mg/dL — ABNORMAL HIGH (ref 70–99)
Potassium: 4.5 mmol/L (ref 3.5–5.1)
Sodium: 140 mmol/L (ref 135–145)
Total Bilirubin: 0.6 mg/dL (ref 0.0–1.2)
Total Protein: 6.8 g/dL (ref 6.5–8.1)

## 2024-03-07 LAB — HEMOGLOBIN A1C
Hgb A1c MFr Bld: 4.7 % — ABNORMAL LOW (ref 4.8–5.6)
Mean Plasma Glucose: 88.19 mg/dL

## 2024-03-07 LAB — CBG MONITORING, ED
Glucose-Capillary: 88 mg/dL (ref 70–99)
Glucose-Capillary: 116 mg/dL — ABNORMAL HIGH (ref 70–99)

## 2024-03-07 LAB — TROPONIN I (HIGH SENSITIVITY)
Troponin I (High Sensitivity): 11 ng/L (ref <18)
Troponin I (High Sensitivity): 13 ng/L (ref <18)

## 2024-03-07 LAB — HIV ANTIBODY (ROUTINE TESTING W REFLEX): HIV Screen 4th Generation wRfx: NONREACTIVE

## 2024-03-07 MED ORDER — HEPARIN SODIUM (PORCINE) 5000 UNIT/ML IJ SOLN
5000.0000 [IU] | Freq: Three times a day (TID) | INTRAMUSCULAR | Status: DC
  Administered 2024-03-07: 5000 [IU] via SUBCUTANEOUS
  Filled 2024-03-07: qty 1

## 2024-03-07 MED ORDER — HYDROXYZINE HCL 25 MG PO TABS: 25.0000 mg | ORAL_TABLET | Freq: Two times a day (BID) | ORAL | Status: DC | PRN

## 2024-03-07 MED ORDER — NEPRO/CARBSTEADY PO LIQD
237.0000 mL | Freq: Two times a day (BID) | ORAL | Status: DC
  Administered 2024-03-07 (×2): 237 mL via ORAL

## 2024-03-07 MED ORDER — HYDRALAZINE HCL 50 MG PO TABS
100.0000 mg | ORAL_TABLET | Freq: Three times a day (TID) | ORAL | Status: DC
  Administered 2024-03-07 (×2): 100 mg via ORAL
  Filled 2024-03-07 (×2): qty 2

## 2024-03-07 MED ORDER — ISOSORBIDE MONONITRATE ER 60 MG PO TB24
30.0000 mg | ORAL_TABLET | Freq: Every day | ORAL | Status: DC
  Administered 2024-03-07: 30 mg via ORAL
  Filled 2024-03-07: qty 1

## 2024-03-07 MED ORDER — ASPIRIN 81 MG PO TBEC
81.0000 mg | DELAYED_RELEASE_TABLET | Freq: Every day | ORAL | Status: DC
  Administered 2024-03-07: 81 mg via ORAL
  Filled 2024-03-07: qty 1

## 2024-03-07 MED ORDER — INSULIN ASPART 100 UNIT/ML IJ SOLN: 0.0000 [IU] | Freq: Every day | INTRAMUSCULAR | Status: DC

## 2024-03-07 MED ORDER — NITROGLYCERIN 0.4 MG SL SUBL: 0.4000 mg | SUBLINGUAL_TABLET | SUBLINGUAL | Status: DC | PRN

## 2024-03-07 MED ORDER — ATORVASTATIN CALCIUM 20 MG PO TABS: 40.0000 mg | ORAL_TABLET | Freq: Every day | ORAL | Status: DC

## 2024-03-07 MED ORDER — ONDANSETRON HCL 4 MG/2ML IJ SOLN: 4.0000 mg | Freq: Four times a day (QID) | INTRAMUSCULAR | Status: DC | PRN

## 2024-03-07 MED ORDER — INSULIN ASPART 100 UNIT/ML IJ SOLN: 0.0000 [IU] | Freq: Three times a day (TID) | INTRAMUSCULAR | Status: DC

## 2024-03-07 MED ORDER — ASPIRIN 81 MG PO TBEC
81.0000 mg | DELAYED_RELEASE_TABLET | Freq: Every day | ORAL | Status: DC
Start: 2024-03-08 — End: 2024-03-07

## 2024-03-07 MED ORDER — ACETAMINOPHEN 325 MG PO TABS: 650.0000 mg | ORAL_TABLET | ORAL | Status: DC | PRN

## 2024-03-07 MED ORDER — AMLODIPINE BESYLATE 5 MG PO TABS
10.0000 mg | ORAL_TABLET | Freq: Every day | ORAL | Status: DC
  Administered 2024-03-07: 10 mg via ORAL
  Filled 2024-03-07: qty 2

## 2024-03-07 NOTE — Assessment & Plan Note (Signed)
 Presumed hyperkalemia given peaked T waves and normalization of potassium with bicarb and calcium gluconate administered en route by EMS EKG with peaking of T waves Last dialysis was on 3/29 Continue to monitor potassium Continuous cardiac monitoring
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
## 2024-03-07 NOTE — Assessment & Plan Note (Signed)
 SBP over 200 on arrival continue home hydralazine amlodipine and isosorbide

## 2024-03-07 NOTE — ED Notes (Signed)
 Dialysis providers at bedside

## 2024-03-07 NOTE — ED Notes (Signed)
 Daughter at bedside. Pt is alert and denies any needs. CB within reach, vitals WDL

## 2024-03-07 NOTE — ED Notes (Signed)
 Daughter at bedside. This RN went over discharge paper work. Pt and daughter denies any questions and concerns.

## 2024-03-07 NOTE — Assessment & Plan Note (Signed)
 Follow echocardiogram Continue hydralazine and isosorbide
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
## 2024-03-07 NOTE — Assessment & Plan Note (Signed)
 Hemoglobin at baseline

## 2024-03-07 NOTE — Hospital Course (Signed)
 Marland Kitchen
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
## 2024-03-07 NOTE — Assessment & Plan Note (Signed)
 Sliding scale insulin coverage

## 2024-03-07 NOTE — Assessment & Plan Note (Signed)
Nephrology consult for continuation of dialysis 
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
## 2024-03-07 NOTE — ED Notes (Signed)
 Attending notified of BP, states to wait until recent BP meds kick in to for further interventions.

## 2024-03-08 LAB — LIPOPROTEIN A (LPA): Lipoprotein (a): 44.8 nmol/L — ABNORMAL HIGH (ref <75.0)

## 2024-09-02 ENCOUNTER — Other Ambulatory Visit: Payer: Self-pay

## 2024-09-02 ENCOUNTER — Emergency Department: Payer: Self-pay

## 2024-09-02 ENCOUNTER — Emergency Department
Admission: EM | Admit: 2024-09-02 | Discharge: 2024-09-03 | Disposition: A | Discharge status: Home / Self Care | Payer: Self-pay | Attending: Emergency Medicine | Admitting: Emergency Medicine

## 2024-09-02 ENCOUNTER — Encounter: Payer: Self-pay | Admitting: Emergency Medicine

## 2024-09-02 DIAGNOSIS — I951 Orthostatic hypotension: Secondary | ICD-10-CM | POA: Insufficient documentation

## 2024-09-02 DIAGNOSIS — R531 Weakness: Secondary | ICD-10-CM | POA: Insufficient documentation

## 2024-09-02 DIAGNOSIS — N186 End stage renal disease: Secondary | ICD-10-CM | POA: Insufficient documentation

## 2024-09-02 DIAGNOSIS — Z992 Dependence on renal dialysis: Secondary | ICD-10-CM | POA: Insufficient documentation

## 2024-09-02 LAB — CBC WITH DIFFERENTIAL/PLATELET
Abs Immature Granulocytes: 0.02 K/uL (ref 0.00–0.07)
Basophils Absolute: 0 K/uL (ref 0.0–0.1)
Basophils Relative: 1 %
Eosinophils Absolute: 0.1 K/uL (ref 0.0–0.5)
Eosinophils Relative: 3 %
HCT: 31.5 % — ABNORMAL LOW (ref 36.0–46.0)
Hemoglobin: 10.1 g/dL — ABNORMAL LOW (ref 12.0–15.0)
Immature Granulocytes: 0 %
Lymphocytes Relative: 19 %
Lymphs Abs: 0.9 K/uL (ref 0.7–4.0)
MCH: 33.4 pg (ref 26.0–34.0)
MCHC: 32.1 g/dL (ref 30.0–36.0)
MCV: 104.3 fL — ABNORMAL HIGH (ref 80.0–100.0)
Monocytes Absolute: 0.5 K/uL (ref 0.1–1.0)
Monocytes Relative: 11 %
Neutro Abs: 3.2 K/uL (ref 1.7–7.7)
Neutrophils Relative %: 66 %
Platelets: 134 K/uL — ABNORMAL LOW (ref 150–400)
RBC: 3.02 MIL/uL — ABNORMAL LOW (ref 3.87–5.11)
RDW: 15.3 % (ref 11.5–15.5)
WBC: 4.9 K/uL (ref 4.0–10.5)
nRBC: 0 % (ref 0.0–0.2)

## 2024-09-02 LAB — TROPONIN I (HIGH SENSITIVITY): Troponin I (High Sensitivity): 8 ng/L (ref <18)

## 2024-09-02 LAB — COMPREHENSIVE METABOLIC PANEL WITH GFR
ALT: 10 U/L (ref 0–44)
AST: 28 U/L (ref 15–41)
Albumin: 3.4 g/dL — ABNORMAL LOW (ref 3.5–5.0)
Alkaline Phosphatase: 152 U/L — ABNORMAL HIGH (ref 38–126)
Anion gap: 14 (ref 5–15)
BUN: 32 mg/dL — ABNORMAL HIGH (ref 8–23)
CO2: 25 mmol/L (ref 22–32)
Calcium: 7.5 mg/dL — ABNORMAL LOW (ref 8.9–10.3)
Chloride: 99 mmol/L (ref 98–111)
Creatinine, Ser: 3.97 mg/dL — ABNORMAL HIGH (ref 0.44–1.00)
GFR, Estimated: 12 mL/min — ABNORMAL LOW (ref >60)
Glucose, Bld: 124 mg/dL — ABNORMAL HIGH (ref 70–99)
Potassium: 4.9 mmol/L (ref 3.5–5.1)
Sodium: 138 mmol/L (ref 135–145)
Total Bilirubin: 0.7 mg/dL (ref 0.0–1.2)
Total Protein: 6 g/dL — ABNORMAL LOW (ref 6.5–8.1)

## 2024-09-02 MED ORDER — SODIUM CHLORIDE 0.9 % IV BOLUS
500.0000 mL | Freq: Once | INTRAVENOUS | Status: AC
  Administered 2024-09-02: 500 mL via INTRAVENOUS

## 2024-09-02 NOTE — ED Provider Notes (Signed)
 [facility] Provider Note   Event Date/Time   First MD Initiated Contact with Patient 09/02/24 1939     (approximate) History  Hypotension  HPI Anita Contreras is a 68 y.o. female with a past medical history of end-stage renal disease on dialysis Tuesday/Thursday/Saturday with a full course of dialysis done today who presents after feeling generally weak with associated hypotension after her session today.  Patient with orthostatic positive per EMS.  Patient states that she had an episode of vomiting after her session today as well.  Patient now endorses bilateral lower extremity weakness and states that she cannot stand without assistance ROS: Patient currently denies any vision changes, tinnitus, difficulty speaking, facial droop, sore throat, chest pain, shortness of breath, abdominal pain, nausea/vomiting/diarrhea, dysuria, or numbness/paresthesias in any extremity   Physical Exam  Triage Vital Signs: ED Triage Vitals  Encounter Vitals Group     BP 09/02/24 1939 (!) 168/58     Girls Systolic BP Percentile --      Girls Diastolic BP Percentile --      Boys Systolic BP Percentile --      Boys Diastolic BP Percentile --      Pulse Rate 09/02/24 1939 72     Resp 09/02/24 1939 12     Temp 09/02/24 1939 98.2 F (36.8 C)     Temp Source 09/02/24 1939 Oral     SpO2 09/02/24 1939 100 %     Weight 09/02/24 1936 109 lb (49.4 kg)     Height 09/02/24 1934 5' 2 (1.575 m)     Head Circumference --      Peak Flow --      Pain Score --      Pain Loc --      Pain Education --      Exclude from Growth Chart --    Most recent vital signs: Vitals:   09/02/24 2107 09/02/24 2110  BP: (!) 133/49 (!) 95/43  Pulse: 63 61  Resp: 16 16  Temp:    SpO2: 100% 100%   General: Awake, cooperative CV:  Good peripheral perfusion. Resp:  Normal effort. Abd:  No distention. Other:  Elderly well-developed, well-nourished Hispanic female resting comfortably in no  acute distress.  No drift in bilateral lower extremities ED Results / Procedures / Treatments  Labs (all labs ordered are listed, but only abnormal results are displayed) Labs Reviewed  CBC WITH DIFFERENTIAL/PLATELET - Abnormal; Notable for the following components:      Result Value   RBC 3.02 (*)    Hemoglobin 10.1 (*)    HCT 31.5 (*)    MCV 104.3 (*)    Platelets 134 (*)    All other components within normal limits  COMPREHENSIVE METABOLIC PANEL WITH GFR - Abnormal; Notable for the following components:   Glucose, Bld 124 (*)    BUN 32 (*)    Creatinine, Ser 3.97 (*)    Calcium  7.5 (*)    Total Protein 6.0 (*)    Albumin 3.4 (*)    Alkaline Phosphatase 152 (*)    GFR, Estimated 12 (*)    All other components within normal limits  TROPONIN I (HIGH SENSITIVITY)  TROPONIN I (HIGH SENSITIVITY)   EKG ED ECG REPORT I, Artist MARLA Kerns, the attending physician, personally viewed and interpreted this ECG. Date: 09/02/2024 EKG Time: 1936 Rate: 72 Rhythm: normal sinus rhythm QRS Axis: normal Intervals: normal ST/T Wave abnormalities: normal Narrative Interpretation: no evidence of acute  ischemia RADIOLOGY ED MD interpretation: CT of the head without contrast interpreted by me shows no evidence of acute abnormalities including no intracerebral hemorrhage, obvious masses, or significant edema - All radiology independently interpreted and agree with radiology assessment Official radiology report(s): CT Head Wo Contrast Result Date: 09/02/2024 CLINICAL DATA:  Neuro deficit, acute, stroke suspected.  Lethargic. EXAM: CT HEAD WITHOUT CONTRAST TECHNIQUE: Contiguous axial images were obtained from the base of the skull through the vertex without intravenous contrast. RADIATION DOSE REDUCTION: This exam was performed according to the departmental dose-optimization program which includes automated exposure control, adjustment of the mA and/or kV according to patient size and/or use of  iterative reconstruction technique. COMPARISON:  12/16/2023 FINDINGS: Brain: No acute intracranial abnormality. Specifically, no hemorrhage, hydrocephalus, mass lesion, acute infarction, or significant intracranial injury. Vascular: No hyperdense vessel or unexpected calcification. Skull: No acute calvarial abnormality. Sinuses/Orbits: No acute findings Other: None IMPRESSION: No acute intracranial abnormality. Electronically Signed   By: Franky Crease M.D.   On: 09/02/2024 20:24   PROCEDURES: Critical Care performed: No Procedures MEDICATIONS ORDERED IN ED: Medications  sodium chloride  0.9 % bolus 500 mL (has no administration in time range)   IMPRESSION / MDM / ASSESSMENT AND PLAN / ED COURSE  I reviewed the triage vital signs and the nursing notes.                             The patient is on the cardiac monitor to evaluate for evidence of arrhythmia and/or significant heart rate changes. Patient's presentation is most consistent with acute presentation with potential threat to life or bodily function. Patient is a 68 year old female with the above-stated past medical history who presents for generalized weakness and hypotension after a dialysis session this morning.  Patient had positive orthostatic vital signs by EMS and route.  EMS did not give any fluids. DDx: CVA, ACS, arrhythmia, AAA, aortic dissection Plan: CBC significant for stable anemia with hemoglobin of 10.1 CMP significant for elevated creatinine to 3.97 with hypocalcemia to 7.5 Troponin negative Head CT negative EKG nonischemic  Care of this patient will be signed out to the oncoming physician at the end of my shift.  All pertinent patient information conveyed and all questions answered.  All further care and disposition decisions will be made by the oncoming physician. Clinical Course as of 09/02/24 2133  Thu Sep 02, 2024  2133 On reassessment, patient given the results of workup so far.  Patient had orthostatic vital  signs that showed significant hypotension upon standing with blood pressure down to 95/43.  Will attempt to correct patient's orthostatic hypotension with 500 cc IV fluid and reassess [EB]    Clinical Course User Index [EB] Jossie Artist POUR, MD   FINAL CLINICAL IMPRESSION(S) / ED DIAGNOSES   Final diagnoses:  None   Rx / DC Orders   ED Discharge Orders     None      Note:  This document was prepared using Dragon voice recognition software and may include unintentional dictation errors.   Jossie Artist POUR, MD 09/04/24 [phone]

## 2024-09-02 NOTE — ED Triage Notes (Addendum)
 Bib EMS from home for hypotension. Had dialysis this morning and has been feeling lethargic since per family.  Pt a&ox2 , is orthostatic positive per ems  Tues, Thurs, Sat dialysis  Per pt I had dialysis this morning then I started to vomit and feel very tired.  Pt a&ox1 on arrival

## 2024-09-02 NOTE — ED Notes (Signed)
 Pt assisted to and from the toilet with 1 person assist. Pt noted to have a bowel movement without complication. Pt and family state no other needs at this time.

## 2024-09-03 NOTE — ED Notes (Signed)
 This RN stood patient at bedside to reassess how the patient feels. Patient did not require any assistance when standing and denies dizziness at this time. Siadecki, MD made aware.

## 2024-09-03 NOTE — ED Provider Notes (Signed)
-----------------------------------------   1:53 AM on 09/03/2024 -----------------------------------------  I took over care of this patient from Dr. Jossie.  The patient was pending reassessment after additional fluids.  On reassessment, the patient states she is feeling better.  Her blood pressure has been stable for the last several hours.  Previously before the second fluid bolus she had orthostatic symptoms, but now she is able to stand up without feeling dizzy.  She would like to go home.  Given the resolved orthostasis and the reassuring lab workup, she is appropriate for discharge at this time.  I gave strict return precautions, and she and the family member expressed understanding.   Jacolyn Pae, MD 09/03/24 [phone]
